# Patient Record
Sex: Male | Born: 1935
Health system: Southern US, Community
[De-identification: ages and names within clinical notes are randomized; demographics above are authoritative.]

## PROBLEM LIST (undated history)

## (undated) DIAGNOSIS — K219 Gastro-esophageal reflux disease without esophagitis: Secondary | ICD-10-CM

## (undated) DIAGNOSIS — R062 Wheezing: Secondary | ICD-10-CM

## (undated) DIAGNOSIS — F528 Other sexual dysfunction not due to a substance or known physiological condition: Secondary | ICD-10-CM

## (undated) DIAGNOSIS — R9431 Abnormal electrocardiogram [ECG] [EKG]: Secondary | ICD-10-CM

## (undated) DIAGNOSIS — R972 Elevated prostate specific antigen [PSA]: Secondary | ICD-10-CM

## (undated) DIAGNOSIS — J45909 Unspecified asthma, uncomplicated: Secondary | ICD-10-CM

## (undated) DIAGNOSIS — E039 Hypothyroidism, unspecified: Secondary | ICD-10-CM

## (undated) DIAGNOSIS — M76899 Other specified enthesopathies of unspecified lower limb, excluding foot: Secondary | ICD-10-CM

## (undated) DIAGNOSIS — M5430 Sciatica, unspecified side: Secondary | ICD-10-CM

## (undated) DIAGNOSIS — G3184 Mild cognitive impairment, so stated: Secondary | ICD-10-CM

## (undated) DIAGNOSIS — K449 Diaphragmatic hernia without obstruction or gangrene: Secondary | ICD-10-CM

## (undated) DIAGNOSIS — R296 Repeated falls: Secondary | ICD-10-CM

## (undated) DIAGNOSIS — I10 Essential (primary) hypertension: Secondary | ICD-10-CM

## (undated) DIAGNOSIS — F0150 Vascular dementia without behavioral disturbance: Secondary | ICD-10-CM

## (undated) DIAGNOSIS — F0390 Unspecified dementia without behavioral disturbance: Secondary | ICD-10-CM

## (undated) DIAGNOSIS — K222 Esophageal obstruction: Secondary | ICD-10-CM

## (undated) DIAGNOSIS — R350 Frequency of micturition: Secondary | ICD-10-CM

## (undated) DIAGNOSIS — F411 Generalized anxiety disorder: Secondary | ICD-10-CM

## (undated) DIAGNOSIS — N400 Enlarged prostate without lower urinary tract symptoms: Secondary | ICD-10-CM

## (undated) DIAGNOSIS — J40 Bronchitis, not specified as acute or chronic: Secondary | ICD-10-CM

## (undated) DIAGNOSIS — R21 Rash and other nonspecific skin eruption: Secondary | ICD-10-CM

## (undated) DIAGNOSIS — E785 Hyperlipidemia, unspecified: Secondary | ICD-10-CM

## (undated) DIAGNOSIS — J209 Acute bronchitis, unspecified: Secondary | ICD-10-CM

## (undated) DIAGNOSIS — N529 Male erectile dysfunction, unspecified: Secondary | ICD-10-CM

## (undated) DIAGNOSIS — R05 Cough: Secondary | ICD-10-CM

## (undated) DIAGNOSIS — Z87442 Personal history of urinary calculi: Secondary | ICD-10-CM

## (undated) DIAGNOSIS — J309 Allergic rhinitis, unspecified: Secondary | ICD-10-CM

## (undated) DIAGNOSIS — N189 Chronic kidney disease, unspecified: Secondary | ICD-10-CM

## (undated) DIAGNOSIS — J449 Chronic obstructive pulmonary disease, unspecified: Secondary | ICD-10-CM

## (undated) HISTORY — DX: Chronic obstructive pulmonary disease, unspecified: J44.9

## (undated) HISTORY — DX: Cough: R05

## (undated) HISTORY — DX: Mild cognitive impairment, so stated: G31.84

## (undated) HISTORY — DX: Hyperlipidemia, unspecified: E78.5

## (undated) HISTORY — DX: Other sexual dysfunction not due to a substance or known physiological condition: F52.8

## (undated) HISTORY — DX: Gastro-esophageal reflux disease without esophagitis: K21.9

## (undated) HISTORY — DX: Hypothyroidism, unspecified: E03.9

## (undated) HISTORY — DX: Rash and other nonspecific skin eruption: R21

## (undated) HISTORY — DX: Sciatica, unspecified side: M54.30

## (undated) HISTORY — DX: Diaphragmatic hernia without obstruction or gangrene: K44.9

## (undated) HISTORY — DX: Benign prostatic hyperplasia without lower urinary tract symptoms: N40.0

## (undated) HISTORY — DX: Male erectile dysfunction, unspecified: N52.9

## (undated) HISTORY — DX: Generalized anxiety disorder: F41.1

## (undated) HISTORY — DX: Abnormal electrocardiogram (ECG) (EKG): R94.31

## (undated) HISTORY — DX: Other specified enthesopathies of unspecified lower limb, excluding foot: M76.899

## (undated) HISTORY — DX: Unspecified asthma, uncomplicated: J45.909

## (undated) HISTORY — DX: Allergic rhinitis, unspecified: J30.9

## (undated) HISTORY — DX: Bronchitis, not specified as acute or chronic: J40

## (undated) HISTORY — DX: Unspecified dementia without behavioral disturbance: F03.90

## (undated) HISTORY — DX: Frequency of micturition: R35.0

## (undated) HISTORY — PX: TONSILLECTOMY: SUR1361

## (undated) HISTORY — DX: Essential (primary) hypertension: I10

## (undated) HISTORY — DX: Esophageal obstruction: K22.2

## (undated) HISTORY — DX: Wheezing: R06.2

## (undated) HISTORY — DX: Elevated prostate specific antigen (PSA): R97.20

## (undated) HISTORY — DX: Personal history of urinary calculi: Z87.442

## (undated) HISTORY — DX: Acute bronchitis, unspecified: J20.9

---

## 1998-11-21 ENCOUNTER — Emergency Department (HOSPITAL_COMMUNITY): Admission: EM | Admit: 1998-11-21 | Discharge: 1998-11-21 | Payer: Self-pay | Admitting: Emergency Medicine

## 1999-10-14 ENCOUNTER — Encounter: Payer: Self-pay | Admitting: Family Medicine

## 1999-10-14 ENCOUNTER — Encounter: Admission: RE | Admit: 1999-10-14 | Discharge: 1999-10-14 | Payer: Self-pay | Admitting: Family Medicine

## 2004-06-27 ENCOUNTER — Emergency Department (HOSPITAL_COMMUNITY): Admission: EM | Admit: 2004-06-27 | Discharge: 2004-06-27 | Payer: Self-pay | Admitting: Emergency Medicine

## 2004-09-10 ENCOUNTER — Encounter: Admission: RE | Admit: 2004-09-10 | Discharge: 2004-09-10 | Payer: Self-pay | Admitting: Family Medicine

## 2004-11-08 ENCOUNTER — Ambulatory Visit: Payer: Self-pay | Admitting: Internal Medicine

## 2004-11-12 ENCOUNTER — Ambulatory Visit: Payer: Self-pay | Admitting: Internal Medicine

## 2004-12-01 ENCOUNTER — Ambulatory Visit: Payer: Self-pay | Admitting: Internal Medicine

## 2004-12-09 ENCOUNTER — Ambulatory Visit: Payer: Self-pay | Admitting: Internal Medicine

## 2005-03-14 ENCOUNTER — Ambulatory Visit: Payer: Self-pay | Admitting: Internal Medicine

## 2005-04-18 ENCOUNTER — Ambulatory Visit: Payer: Self-pay | Admitting: Internal Medicine

## 2005-04-28 ENCOUNTER — Ambulatory Visit: Payer: Self-pay

## 2005-08-20 ENCOUNTER — Ambulatory Visit: Payer: Self-pay | Admitting: Internal Medicine

## 2006-03-16 ENCOUNTER — Ambulatory Visit (HOSPITAL_BASED_OUTPATIENT_CLINIC_OR_DEPARTMENT_OTHER): Admission: RE | Admit: 2006-03-16 | Discharge: 2006-03-16 | Payer: Self-pay | Admitting: Urology

## 2006-06-06 ENCOUNTER — Ambulatory Visit: Payer: Self-pay | Admitting: Internal Medicine

## 2006-06-06 LAB — CONVERTED CEMR LAB
ALT: 18 units/L (ref 0–40)
AST: 27 units/L (ref 0–37)
Alkaline Phosphatase: 59 units/L (ref 39–117)
BUN: 26 mg/dL — ABNORMAL HIGH (ref 6–23)
Basophils Relative: 0.5 % (ref 0.0–1.0)
CO2: 30 meq/L (ref 19–32)
Calcium: 9.2 mg/dL (ref 8.4–10.5)
Chloride: 103 meq/L (ref 96–112)
Cholesterol: 146 mg/dL (ref 0–200)
GFR calc Af Amer: 63 mL/min
HDL: 42.9 mg/dL (ref >39.0)
Lymphocytes Relative: 24.2 % (ref 12.0–46.0)
Monocytes Relative: 9.3 % (ref 3.0–11.0)
Neutro Abs: 6.2 10*3/uL (ref 1.4–7.7)
Platelets: 213 10*3/uL (ref 150–400)
RBC: 4.29 M/uL (ref 4.22–5.81)
RDW: 11.9 % (ref 11.5–14.6)
TSH: 4.77 microintl units/mL (ref 0.35–5.50)
Total CHOL/HDL Ratio: 3.4
Total Protein: 6.3 g/dL (ref 6.0–8.3)
Triglycerides: 222 mg/dL (ref 0–149)
VLDL: 44 mg/dL — ABNORMAL HIGH (ref 0–40)
WBC: 9.6 10*3/uL (ref 4.5–10.5)

## 2006-07-11 ENCOUNTER — Ambulatory Visit: Payer: Self-pay | Admitting: Internal Medicine

## 2006-10-27 ENCOUNTER — Ambulatory Visit: Payer: Self-pay | Admitting: Internal Medicine

## 2006-11-21 ENCOUNTER — Ambulatory Visit: Payer: Self-pay | Admitting: Internal Medicine

## 2007-03-23 ENCOUNTER — Ambulatory Visit: Payer: Self-pay | Admitting: Internal Medicine

## 2007-03-23 DIAGNOSIS — I10 Essential (primary) hypertension: Secondary | ICD-10-CM

## 2007-03-23 DIAGNOSIS — Z87442 Personal history of urinary calculi: Secondary | ICD-10-CM | POA: Insufficient documentation

## 2007-03-23 DIAGNOSIS — F528 Other sexual dysfunction not due to a substance or known physiological condition: Secondary | ICD-10-CM

## 2007-03-23 DIAGNOSIS — K219 Gastro-esophageal reflux disease without esophagitis: Secondary | ICD-10-CM | POA: Insufficient documentation

## 2007-03-23 DIAGNOSIS — E785 Hyperlipidemia, unspecified: Secondary | ICD-10-CM | POA: Insufficient documentation

## 2007-03-23 DIAGNOSIS — N400 Enlarged prostate without lower urinary tract symptoms: Secondary | ICD-10-CM

## 2007-03-23 DIAGNOSIS — N401 Enlarged prostate with lower urinary tract symptoms: Secondary | ICD-10-CM | POA: Insufficient documentation

## 2007-03-23 DIAGNOSIS — R21 Rash and other nonspecific skin eruption: Secondary | ICD-10-CM

## 2007-03-23 DIAGNOSIS — J309 Allergic rhinitis, unspecified: Secondary | ICD-10-CM

## 2007-03-23 HISTORY — DX: Hyperlipidemia, unspecified: E78.5

## 2007-03-23 HISTORY — DX: Benign prostatic hyperplasia without lower urinary tract symptoms: N40.0

## 2007-03-23 HISTORY — DX: Other sexual dysfunction not due to a substance or known physiological condition: F52.8

## 2007-03-23 HISTORY — DX: Allergic rhinitis, unspecified: J30.9

## 2007-03-23 HISTORY — DX: Gastro-esophageal reflux disease without esophagitis: K21.9

## 2007-03-23 HISTORY — DX: Rash and other nonspecific skin eruption: R21

## 2007-03-23 HISTORY — DX: Essential (primary) hypertension: I10

## 2007-03-23 HISTORY — DX: Personal history of urinary calculi: Z87.442

## 2007-04-21 ENCOUNTER — Ambulatory Visit: Payer: Self-pay | Admitting: Internal Medicine

## 2007-04-21 DIAGNOSIS — J40 Bronchitis, not specified as acute or chronic: Secondary | ICD-10-CM | POA: Insufficient documentation

## 2007-04-21 HISTORY — DX: Bronchitis, not specified as acute or chronic: J40

## 2007-04-27 ENCOUNTER — Ambulatory Visit: Payer: Self-pay | Admitting: Internal Medicine

## 2007-04-27 DIAGNOSIS — J209 Acute bronchitis, unspecified: Secondary | ICD-10-CM

## 2007-04-27 HISTORY — DX: Acute bronchitis, unspecified: J20.9

## 2007-06-14 ENCOUNTER — Ambulatory Visit: Payer: Self-pay | Admitting: Internal Medicine

## 2007-06-14 LAB — CONVERTED CEMR LAB
ALT: 16 units/L (ref 0–53)
Alkaline Phosphatase: 61 units/L (ref 39–117)
BUN: 24 mg/dL — ABNORMAL HIGH (ref 6–23)
Basophils Absolute: 0 10*3/uL (ref 0.0–0.1)
Bilirubin Urine: NEGATIVE
Bilirubin, Direct: 0.2 mg/dL (ref 0.0–0.3)
Calcium: 9.1 mg/dL (ref 8.4–10.5)
Cholesterol: 169 mg/dL (ref 0–200)
Eosinophils Absolute: 0.1 10*3/uL (ref 0.0–0.6)
GFR calc Af Amer: 76 mL/min
GFR calc non Af Amer: 62 mL/min
Glucose, Bld: 92 mg/dL (ref 70–99)
Lymphocytes Relative: 34.4 % (ref 12.0–46.0)
MCHC: 33.2 g/dL (ref 30.0–36.0)
MCV: 95.4 fL (ref 78.0–100.0)
Monocytes Relative: 8.9 % (ref 3.0–11.0)
Neutro Abs: 3.3 10*3/uL (ref 1.4–7.7)
Platelets: 222 10*3/uL (ref 150–400)
Total CHOL/HDL Ratio: 3.5
Triglycerides: 110 mg/dL (ref 0–149)
Urine Glucose: NEGATIVE mg/dL

## 2007-06-21 ENCOUNTER — Ambulatory Visit: Payer: Self-pay | Admitting: Internal Medicine

## 2007-06-21 DIAGNOSIS — R9431 Abnormal electrocardiogram [ECG] [EKG]: Secondary | ICD-10-CM

## 2007-06-21 HISTORY — DX: Abnormal electrocardiogram (ECG) (EKG): R94.31

## 2007-06-28 ENCOUNTER — Ambulatory Visit: Payer: Self-pay

## 2007-06-28 ENCOUNTER — Encounter: Payer: Self-pay | Admitting: Internal Medicine

## 2007-07-18 DIAGNOSIS — K222 Esophageal obstruction: Secondary | ICD-10-CM

## 2007-07-18 DIAGNOSIS — K449 Diaphragmatic hernia without obstruction or gangrene: Secondary | ICD-10-CM | POA: Insufficient documentation

## 2007-07-26 ENCOUNTER — Ambulatory Visit: Payer: Self-pay | Admitting: Pulmonary Disease

## 2008-03-14 ENCOUNTER — Telehealth (INDEPENDENT_AMBULATORY_CARE_PROVIDER_SITE_OTHER): Payer: Self-pay | Admitting: *Deleted

## 2008-04-02 ENCOUNTER — Ambulatory Visit: Payer: Self-pay | Admitting: Internal Medicine

## 2008-04-12 ENCOUNTER — Ambulatory Visit: Payer: Self-pay | Admitting: Family Medicine

## 2008-04-28 ENCOUNTER — Ambulatory Visit: Payer: Self-pay | Admitting: Internal Medicine

## 2008-04-28 DIAGNOSIS — M5430 Sciatica, unspecified side: Secondary | ICD-10-CM

## 2008-04-28 HISTORY — DX: Sciatica, unspecified side: M54.30

## 2008-06-17 ENCOUNTER — Ambulatory Visit: Payer: Self-pay | Admitting: Internal Medicine

## 2008-06-17 LAB — CONVERTED CEMR LAB
AST: 23 units/L (ref 0–37)
Albumin: 3.9 g/dL (ref 3.5–5.2)
Alkaline Phosphatase: 65 units/L (ref 39–117)
Basophils Absolute: 0 10*3/uL (ref 0.0–0.1)
Basophils Relative: 0.4 % (ref 0.0–3.0)
Bilirubin, Direct: 0.2 mg/dL (ref 0.0–0.3)
Calcium: 9.6 mg/dL (ref 8.4–10.5)
Direct LDL: 167.8 mg/dL
GFR calc non Af Amer: 68.76 mL/min (ref >60)
HDL: 44.3 mg/dL (ref >39.00)
Hemoglobin, Urine: NEGATIVE
Hemoglobin: 14.5 g/dL (ref 13.0–17.0)
Lymphocytes Relative: 24.5 % (ref 12.0–46.0)
Monocytes Relative: 8.1 % (ref 3.0–12.0)
Neutro Abs: 6.2 10*3/uL (ref 1.4–7.7)
RBC: 4.41 M/uL (ref 4.22–5.81)
RDW: 12.1 % (ref 11.5–14.6)
Sodium: 142 meq/L (ref 135–145)
Total CHOL/HDL Ratio: 6
Total Protein, Urine: NEGATIVE mg/dL
Triglycerides: 130 mg/dL (ref 0.0–149.0)
Urine Glucose: NEGATIVE mg/dL
VLDL: 26 mg/dL (ref 0.0–40.0)
pH: 6 (ref 5.0–8.0)

## 2008-06-27 ENCOUNTER — Ambulatory Visit: Payer: Self-pay | Admitting: Internal Medicine

## 2008-06-27 DIAGNOSIS — R972 Elevated prostate specific antigen [PSA]: Secondary | ICD-10-CM | POA: Insufficient documentation

## 2008-06-27 DIAGNOSIS — R5383 Other fatigue: Secondary | ICD-10-CM

## 2008-06-27 DIAGNOSIS — R5381 Other malaise: Secondary | ICD-10-CM

## 2008-06-27 HISTORY — DX: Elevated prostate specific antigen (PSA): R97.20

## 2008-07-31 ENCOUNTER — Ambulatory Visit: Payer: Self-pay | Admitting: Internal Medicine

## 2008-07-31 DIAGNOSIS — M76899 Other specified enthesopathies of unspecified lower limb, excluding foot: Secondary | ICD-10-CM

## 2008-07-31 HISTORY — DX: Other specified enthesopathies of unspecified lower limb, excluding foot: M76.899

## 2008-09-03 ENCOUNTER — Ambulatory Visit: Payer: Self-pay | Admitting: Internal Medicine

## 2008-09-04 LAB — CONVERTED CEMR LAB
ALT: 16 units/L (ref 0–53)
AST: 23 units/L (ref 0–37)
Alkaline Phosphatase: 60 units/L (ref 39–117)
Bilirubin, Direct: 0.2 mg/dL (ref 0.0–0.3)
HDL: 41.9 mg/dL (ref >39.00)
PSA: 3.3 ng/mL (ref 0.10–4.00)
Total Protein: 7.3 g/dL (ref 6.0–8.3)

## 2008-10-15 ENCOUNTER — Encounter: Payer: Self-pay | Admitting: Internal Medicine

## 2009-01-29 ENCOUNTER — Ambulatory Visit: Payer: Self-pay | Admitting: Internal Medicine

## 2009-01-29 DIAGNOSIS — R059 Cough, unspecified: Secondary | ICD-10-CM

## 2009-01-29 DIAGNOSIS — R05 Cough: Secondary | ICD-10-CM | POA: Insufficient documentation

## 2009-01-29 HISTORY — DX: Cough, unspecified: R05.9

## 2009-03-16 ENCOUNTER — Ambulatory Visit: Payer: Self-pay | Admitting: Internal Medicine

## 2009-03-26 ENCOUNTER — Telehealth: Payer: Self-pay | Admitting: Internal Medicine

## 2009-04-17 ENCOUNTER — Ambulatory Visit: Payer: Self-pay | Admitting: Internal Medicine

## 2009-04-17 DIAGNOSIS — R062 Wheezing: Secondary | ICD-10-CM

## 2009-04-17 DIAGNOSIS — N529 Male erectile dysfunction, unspecified: Secondary | ICD-10-CM

## 2009-04-17 HISTORY — DX: Wheezing: R06.2

## 2009-04-17 HISTORY — DX: Male erectile dysfunction, unspecified: N52.9

## 2009-04-20 ENCOUNTER — Ambulatory Visit: Payer: Self-pay | Admitting: Internal Medicine

## 2009-04-21 LAB — CONVERTED CEMR LAB
Alkaline Phosphatase: 60 units/L (ref 39–117)
Bilirubin, Direct: 0.3 mg/dL (ref 0.0–0.3)
LDL Cholesterol: 96 mg/dL (ref 0–99)
PSA: 4.03 ng/mL — ABNORMAL HIGH (ref 0.10–4.00)
Total Bilirubin: 1.5 mg/dL — ABNORMAL HIGH (ref 0.3–1.2)
Total CHOL/HDL Ratio: 3
VLDL: 22 mg/dL (ref 0.0–40.0)

## 2009-04-24 ENCOUNTER — Telehealth: Payer: Self-pay | Admitting: Internal Medicine

## 2009-04-28 ENCOUNTER — Ambulatory Visit: Payer: Self-pay | Admitting: Internal Medicine

## 2009-05-14 ENCOUNTER — Ambulatory Visit: Payer: Self-pay | Admitting: Pulmonary Disease

## 2009-07-02 ENCOUNTER — Telehealth (INDEPENDENT_AMBULATORY_CARE_PROVIDER_SITE_OTHER): Payer: Self-pay | Admitting: *Deleted

## 2009-07-22 ENCOUNTER — Encounter: Payer: Self-pay | Admitting: Internal Medicine

## 2009-07-22 ENCOUNTER — Ambulatory Visit: Payer: Self-pay | Admitting: Pulmonary Disease

## 2009-10-07 ENCOUNTER — Ambulatory Visit: Payer: Self-pay | Admitting: Internal Medicine

## 2009-11-25 ENCOUNTER — Telehealth: Payer: Self-pay | Admitting: Internal Medicine

## 2009-12-10 ENCOUNTER — Telehealth: Payer: Self-pay | Admitting: Internal Medicine

## 2009-12-11 ENCOUNTER — Encounter: Payer: Self-pay | Admitting: Internal Medicine

## 2010-01-11 ENCOUNTER — Telehealth: Payer: Self-pay | Admitting: Pulmonary Disease

## 2010-01-13 ENCOUNTER — Ambulatory Visit: Payer: Self-pay | Admitting: Pulmonary Disease

## 2010-01-14 DIAGNOSIS — J45909 Unspecified asthma, uncomplicated: Secondary | ICD-10-CM

## 2010-01-14 DIAGNOSIS — J45991 Cough variant asthma: Secondary | ICD-10-CM

## 2010-01-14 HISTORY — DX: Unspecified asthma, uncomplicated: J45.909

## 2010-03-18 ENCOUNTER — Ambulatory Visit: Payer: Self-pay | Admitting: Internal Medicine

## 2010-04-26 ENCOUNTER — Ambulatory Visit
Admission: RE | Admit: 2010-04-26 | Discharge: 2010-04-26 | Payer: Self-pay | Source: Home / Self Care | Attending: Internal Medicine | Admitting: Internal Medicine

## 2010-04-26 ENCOUNTER — Other Ambulatory Visit: Payer: Self-pay | Admitting: Internal Medicine

## 2010-04-26 DIAGNOSIS — R350 Frequency of micturition: Secondary | ICD-10-CM

## 2010-04-26 HISTORY — DX: Frequency of micturition: R35.0

## 2010-04-26 LAB — CBC WITH DIFFERENTIAL/PLATELET
Basophils Absolute: 0 10*3/uL (ref 0.0–0.1)
Eosinophils Absolute: 0.1 10*3/uL (ref 0.0–0.7)
Hemoglobin: 14.8 g/dL (ref 13.0–17.0)
Lymphocytes Relative: 24.6 % (ref 12.0–46.0)
Lymphs Abs: 2 10*3/uL (ref 0.7–4.0)
MCHC: 34.6 g/dL (ref 30.0–36.0)
Monocytes Relative: 8.1 % (ref 3.0–12.0)
Neutro Abs: 5.4 10*3/uL (ref 1.4–7.7)
Platelets: 242 10*3/uL (ref 150.0–400.0)
RDW: 12.7 % (ref 11.5–14.6)

## 2010-04-26 LAB — TSH: TSH: 5.22 u[IU]/mL (ref 0.35–5.50)

## 2010-04-26 LAB — BASIC METABOLIC PANEL WITH GFR
BUN: 25 mg/dL — ABNORMAL HIGH (ref 6–23)
CO2: 32 meq/L (ref 19–32)
Calcium: 9.5 mg/dL (ref 8.4–10.5)
Chloride: 100 meq/L (ref 96–112)
Creatinine, Ser: 1.2 mg/dL (ref 0.4–1.5)
GFR: 61.3 mL/min
Glucose, Bld: 80 mg/dL (ref 70–99)
Potassium: 4.9 meq/L (ref 3.5–5.1)
Sodium: 138 meq/L (ref 135–145)

## 2010-04-26 LAB — LIPID PANEL
Cholesterol: 177 mg/dL (ref 0–200)
HDL: 53.9 mg/dL
Total CHOL/HDL Ratio: 3
Triglycerides: 237 mg/dL — ABNORMAL HIGH (ref 0.0–149.0)
VLDL: 47.4 mg/dL — ABNORMAL HIGH (ref 0.0–40.0)

## 2010-04-26 LAB — HEPATIC FUNCTION PANEL
ALT: 16 U/L (ref 0–53)
AST: 20 U/L (ref 0–37)
Albumin: 4 g/dL (ref 3.5–5.2)
Alkaline Phosphatase: 66 U/L (ref 39–117)
Bilirubin, Direct: 0.2 mg/dL (ref 0.0–0.3)
Total Bilirubin: 1.5 mg/dL — ABNORMAL HIGH (ref 0.3–1.2)
Total Protein: 6.2 g/dL (ref 6.0–8.3)

## 2010-04-26 LAB — URINALYSIS, ROUTINE W REFLEX MICROSCOPIC
Bilirubin Urine: NEGATIVE
Hemoglobin, Urine: NEGATIVE
Ketones, ur: NEGATIVE
Total Protein, Urine: NEGATIVE
Urine Glucose: NEGATIVE
pH: 5.5 (ref 5.0–8.0)

## 2010-04-26 LAB — PSA: PSA: 4.59 ng/mL — ABNORMAL HIGH (ref 0.10–4.00)

## 2010-05-04 NOTE — Progress Notes (Signed)
Summary: REFILL  Phone Note Call from Patient   Caller: Patient Call For: ALVA Summary of Call: [address] Initial call taken by: Gustavus Bryant,  January 11, 2010 10:08 AM  Follow-up for Phone Call        Pt confirmed request for Qvar 80. Rx sent to pharmacy, pt advised, and follow up NP appointment scheduled. Iran Planas CMA  January 11, 2010 10:16 AM     Prescriptions: QVAR 80 MCG/ACT AERS (BECLOMETHASONE DIPROPIONATE) 2 puffs two times a day  #1 x 1   Entered by:   Iran Planas CMA   Authorized by:   Leanna Sato. Elsworth Soho MD   Signed by:   Iran Planas CMA on 01/11/2010   Method used:   Electronically to        [facility]. # [id number]* (retail)       [address]       [place], [place]  [address]       Ph: [phone] or [phone]       Fax: [phone]   RxID:   [id number]

## 2010-05-04 NOTE — Progress Notes (Signed)
Summary: PA-Benicar  Phone Note From Pharmacy   Summary of Call: PA-Benicar, called [employer]  @ [phone], they are refaxing forms . Ophelia Charter  December 10, 2009 2:59 PM  Faxed to [facility] @ [phone], awaiting approval . Ophelia Charter  December 11, 2009 9:05 AM  Insurance Denied Benicar non-formulary drug, must try and fail Avapro, Diovan or Losartan. Initial call taken by: Ophelia Charter,  December 11, 2009 2:54 PM  Follow-up for Phone Call        noted - call pt - insurance will not pay for benicar, so we will need to change to diovan (which is very close to the same thing) -   done per emr Follow-up by: Biagio Borg MD,  December 11, 2009 4:08 PM  Additional Follow-up for Phone Call Additional follow up Details #1::        Phone number out of service. Crissie Sickles, CMA  December 11, 2009 4:38 PM  Pt informed of medication change via VM. told to call back with any further questions or concerns Additional Follow-up by: Crissie Sickles, CMA,  December 14, 2009 9:26 AM    New/Updated Medications: DIOVAN 160 MG TABS (VALSARTAN) 1po once daily Prescriptions: DIOVAN 160 MG TABS (VALSARTAN) 1po once daily  #30 x 11   Entered and Authorized by:   Biagio Borg MD   Signed by:   Biagio Borg MD on 12/11/2009   Method used:   Electronically to        [facility]. # [id number]* (retail)       [address]       [place], [place]  [address]       Ph: [phone] or [phone]       Fax: [phone]   RxID:   [id number]

## 2010-05-04 NOTE — Medication Information (Signed)
Summary: [facility] [facility]  [facility] [facility]   Imported By: Bubba Hales 12/18/2009 12:46:27  _____________________________________________________________________  External Attachment:    Type:   Image     Comment:   External Document

## 2010-05-04 NOTE — Assessment & Plan Note (Signed)
Summary: 6 MO ROV /RASH/ NWS   Vital Signs:  Patient profile:   77 year old male Height:      72 inches Weight:      204.75 pounds BMI:     27.87 O2 Sat:      94 % on Room air Temp:     98.3 degrees F oral Pulse rate:   70 / minute BP sitting:   132 / 74  (left arm) Cuff size:   large  Vitals Entered By: Shirlean Mylar Ewing CMA (AAMA) (October 07, 2009 4:05 PM)  O2 Flow:  Room air  CC: 6 month ROV/RE   Primary Care Provider:  Jenny Reichmann  CC:  6 month ROV/RE.  History of Present Illness: overall doing ok, except for itchy rash recurring to the forearms, has seens derm but told (per pt) he was "just getting old."  Quite pruritic however adn lately drawing blood with excoriations.  No fever, sweling but red and itching area seems larger with more scratching.  Pt denies CP, sob, doe, wheezing, orthopnea, pnd, worsening LE edema, palps, dizziness or syncope  Pt denies new neuro symptoms such as headache, facial or extremity weakness .  Overall good compliance with meds, trying to follow low chol diet, wt stable, little excercise however   Here for wellness Diet: Heart Healthy or DM if diabetic Physical Activities: Sedentary Depression/mood screen: Negative Hearing: Intact bilateral Visual Acuity: Grossly normal, gets exam yearly ADL's: Capable  Fall Risk: None Home Safety: Good Cognitive Impairment:  Gen appearance, affect, speech, memory, attention & motor skills grossly intact End-of-Life Planning: Advance directive - Full code/I agree   Preventive Screening-Counseling & Management      Drug Use:  no.    Problems Prior to Update: 1)  Wheezing  (ICD-786.07) 2)  Erectile Dysfunction, Organic  (ICD-607.84) 3)  Rash-nonvesicular  (ICD-782.1) 4)  Cough  (ICD-786.2) 5)  Hepatotoxicity, Drug-induced, Risk of  (ICD-V58.69) 6)  Bursitis, Right Hip  (ICD-726.5) 7)  Fatigue  (ICD-780.79) 8)  Psa, Increased  (ICD-790.93) 9)  Sciatica, Right  (ICD-724.3) 10)  Schatzki's Ring  (ICD-530.3) 11)   Hiatal Hernia  (ICD-553.3) 12)  Abnormal Electrocardiogram  (ICD-794.31) 13)  Preventive Health Care  (ICD-V70.0) 14)  Asthmatic Bronchitis, Acute  (ICD-466.0) 15)  Bronchitis Not Specified As Acute or Chronic  (ICD-490) 16)  Rash-nonvesicular  (ICD-782.1) 17)  Nephrolithiasis, Hx of  (ICD-V13.01) 18)  Erectile Dysfunction  (ICD-302.72) 19)  Benign Prostatic Hypertrophy  (ICD-600.00) 20)  Allergic Rhinitis  (ICD-477.9) 21)  Gerd  (ICD-530.81) 22)  Hyperlipidemia  (ICD-272.4) 23)  Hypertension  (ICD-401.9)  Medications Prior to Update: 1)  Centrum Silver Ultra Mens  Tabs (Multiple Vitamins-Minerals) .Marland Kitchen.. 1 By Mouth Once Daily 2)  Tamsulosin Hcl 0.4 Mg Caps (Tamsulosin Hcl) .... Take 1 Capsule By Mouth Once A Day 3)  Prevacid Solutab 30 Mg Tbdp (Lansoprazole) .... Take 1 Tablet 30 Minutes Before Breakfast Daily As Needed 4)  Dulcolax 5 Mg  Tbec (Bisacodyl) .... As Needed 5)  Dilt-Xr 180 Mg Xr24h-Cap (Diltiazem Hcl) .Marland Kitchen.. 1 By Mouth Once Daily 6)  Proair Hfa 108 (90 Base) Mcg/act Aers (Albuterol Sulfate) .... 2 Puffs Q 6 Hrs As Needed 7)  Vitamin D 1000 Unit Caps (Cholecalciferol) .Marland Kitchen.. 1 By Mouth Once Daily 8)  Crestor 20 Mg Tabs (Rosuvastatin Calcium) .... As Directed 9)  Fish Oil 1200 Mg Caps (Omega-3 Fatty Acids) .Marland Kitchen.. 1 By Mouth Once Daily 10)  Qvar 80 Mcg/act Aers (Beclomethasone Dipropionate) .... 2 Puffs  Two Times A Day  Current Medications (verified): 1)  Centrum Silver Ultra Mens  Tabs (Multiple Vitamins-Minerals) .Marland Kitchen.. 1 By Mouth Once Daily 2)  Tamsulosin Hcl 0.4 Mg Caps (Tamsulosin Hcl) .... Take 1 Capsule By Mouth Once A Day 3)  Prevacid Solutab 30 Mg Tbdp (Lansoprazole) .... Take 1 Tablet 30 Minutes Before Breakfast Daily As Needed 4)  Dulcolax 5 Mg  Tbec (Bisacodyl) .... As Needed 5)  Dilt-Xr 180 Mg Xr24h-Cap (Diltiazem Hcl) .Marland Kitchen.. 1 By Mouth Once Daily 6)  Proair Hfa 108 (90 Base) Mcg/act Aers (Albuterol Sulfate) .... 2 Puffs Q 6 Hrs As Needed 7)  Vitamin D 1000 Unit Caps  (Cholecalciferol) .Marland Kitchen.. 1 By Mouth Once Daily 8)  Crestor 20 Mg Tabs (Rosuvastatin Calcium) .... As Directed 9)  Fish Oil 1200 Mg Caps (Omega-3 Fatty Acids) .Marland Kitchen.. 1 By Mouth Once Daily 10)  Qvar 80 Mcg/act Aers (Beclomethasone Dipropionate) .... 2 Puffs Two Times A Day 11)  Prednisone 10 Mg Tabs (Prednisone) .... 3po Qd For 3days, Then 2po Qd For 3days, Then 1po Qd For 3days, Then Stop 12)  Triamcinolone Acetonide 0.1 % Crea (Triamcinolone Acetonide) .... Use Asd Two Times A Day As Needed 13)  Fluticasone Propionate 50 Mcg/act Susp (Fluticasone Propionate) .... 2 Spray/side Once Daily  Allergies (verified): 1)  Cardura 2)  Lipitor (Atorvastatin)  Past History:  Family History: Last updated: 07/26/2007 Family History Lung cancer Family History Hypertension COPD cancer brother smoking emphysema-brother smoking  Social History: Last updated: 10/07/2009 Former Smoker Alcohol use-no Married 2 children retired  Ambulance person lab for 20 yrs, later Herbalist for 17 yrs Drug use-no  Risk Factors: Smoking Status: quit (01/29/2009)  Past Medical History: Hypertension Hyperlipidemia chronic constipation  -  Dr Dennie Fetters GERD schatzki's ring Allergic rhinitis Benign prostatic hypertrophy eczema E.D. Nephrolithiasis, hx of Wheezing/reactive airways disease -   Dr Mia Creek  Past Surgical History: Reviewed history from 03/23/2007 and no changes required. Tonsillectomy  Social History: Reviewed history from 04/17/2009 and no changes required. Former Smoker Alcohol use-no Married 2 children retired  Ambulance person lab for 20 yrs, later Herbalist for 17 yrs Drug use-no Drug Use:  no  Review of Systems  The patient denies anorexia, fever, vision loss, decreased hearing, hoarseness, chest pain, syncope, dyspnea on exertion, peripheral edema, prolonged cough, headaches, hemoptysis, abdominal pain, melena, hematochezia, severe indigestion/heartburn, hematuria, muscle weakness,  suspicious skin lesions, transient blindness, difficulty walking, depression, unusual weight change, abnormal bleeding, enlarged lymph nodes, and angioedema.         all otherwise negative per pt -    Physical Exam  General:  alert and overweight-appearing.   Head:  normocephalic and atraumatic.   Eyes:  vision grossly intact, pupils equal, and pupils round.   Ears:  R ear normal and L ear normal.   Nose:  no external deformity and no nasal discharge.   Mouth:  no gingival abnormalities and pharynx pink and moist.   Neck:  supple and no masses.   Lungs:  normal respiratory effort and normal breath sounds.   Heart:  normal rate and regular rhythm.   Abdomen:  soft, non-tender, and normal bowel sounds.   Msk:  no joint tenderness and no joint swelling.   Extremities:  no edema, no erythema  Skin:  marked eczema flare bilat arms wtih excoriations Psych:  not depressed appearing and moderately anxious.     Impression & Recommendations:  Problem # 1:  Preventive Health Care (ICD-V70.0)  Overall doing well, age  appropriate education and counseling updated and referral for appropriate preventive services done unless declined, immunizations up to date or declined, diet counseling done if overweight, urged to quit smoking if smokes , most recent labs reviewed and current ordered if appropriate, ecg reviewed or declined (interpretation per ECG scanned in the EMR if done); information regarding Medicare Prevention requirements given if appropriate; speciality referrals updated as appropriate   Orders: First annual wellness visit with prevention plan  [id number])  Problem # 2:  RASH-NONVESICULAR (ICD-782.1)  c/w prob allergic type/eczema severe flare  - for depo today, pred pack and triam cr  His updated medication list for this problem includes:    Triamcinolone Acetonide 0.1 % Crea (Triamcinolone acetonide) ..... Use asd two times a day as needed  Orders: Depo- Medrol 40mg  (J1030) Depo-  Medrol 80mg  (J1040) Admin of Therapeutic Inj  intramuscular or subcutaneous [id number]) Prescription Created Electronically [id number])  Problem # 3:  HYPERTENSION (ICD-401.9)  His updated medication list for this problem includes:    Dilt-xr 180 Mg Xr24h-cap (Diltiazem hcl) .Marland Kitchen... 1 by mouth once daily  BP today: 132/74 Prior BP: 130/80 (07/22/2009)  Labs Reviewed: K+: 4.7 (06/17/2008) Creat: : 1.1 (06/17/2008)   Chol: 167 (04/20/2009)   HDL: 49.40 (04/20/2009)   LDL: 96 (04/20/2009)   TG: 110.0 (04/20/2009) stable overall by hx and exam, ok to continue meds/tx as is   Problem # 4:  HYPERLIPIDEMIA (ICD-272.4)  His updated medication list for this problem includes:    Crestor 20 Mg Tabs (Rosuvastatin calcium) .Marland Kitchen... As directed  Labs Reviewed: SGOT: 28 (04/20/2009)   SGPT: 21 (04/20/2009)   HDL:49.40 (04/20/2009), 41.90 (09/03/2008)  LDL:96 (04/20/2009), 99 (06/14/2007)  Chol:167 (04/20/2009), 234 (09/03/2008)  Trig:110.0 (04/20/2009), 223.0 (09/03/2008) stable overall by hx and exam, ok to continue meds/tx as is   Problem # 5:  ALLERGIC RHINITIS (ICD-477.9)  His updated medication list for this problem includes:    Fluticasone Propionate 50 Mcg/act Susp (Fluticasone propionate) .Marland Kitchen... 2 spray/side once daily treat as above, f/u any worsening signs or symptoms , stable overall by hx and exam, ok to continue meds/tx as is   Complete Medication List: 1)  Centrum Silver Ultra Mens Tabs (Multiple vitamins-minerals) .Marland Kitchen.. 1 by mouth once daily 2)  Tamsulosin Hcl 0.4 Mg Caps (Tamsulosin hcl) .... Take 1 capsule by mouth once a day 3)  Prevacid Solutab 30 Mg Tbdp (Lansoprazole) .... Take 1 tablet 30 minutes before breakfast daily as needed 4)  Dulcolax 5 Mg Tbec (Bisacodyl) .... As needed 5)  Dilt-xr 180 Mg Xr24h-cap (Diltiazem hcl) .Marland Kitchen.. 1 by mouth once daily 6)  Proair Hfa 108 (90 Base) Mcg/act Aers (Albuterol sulfate) .... 2 puffs q 6 hrs as needed 7)  Vitamin D 1000 Unit Caps  (Cholecalciferol) .Marland Kitchen.. 1 by mouth once daily 8)  Crestor 20 Mg Tabs (Rosuvastatin calcium) .... As directed 9)  Fish Oil 1200 Mg Caps (Omega-3 fatty acids) .Marland Kitchen.. 1 by mouth once daily 10)  Qvar 80 Mcg/act Aers (Beclomethasone dipropionate) .... 2 puffs two times a day 11)  Prednisone 10 Mg Tabs (Prednisone) .... 3po qd for 3days, then 2po qd for 3days, then 1po qd for 3days, then stop 12)  Triamcinolone Acetonide 0.1 % Crea (Triamcinolone acetonide) .... Use asd two times a day as needed 13)  Fluticasone Propionate 50 Mcg/act Susp (Fluticasone propionate) .... 2 spray/side once daily  Patient Instructions: 1)  you had the steroid shot 2)  Please take all new medications as prescribed  - the prednisone  and steroid cream as needed  3)  Continue all previous medications as before this visit  4)  Please schedule a follow-up appointment in 6 months for your "yearly medicare exam" Prescriptions: DILT-XR 180 MG XR24H-CAP (DILTIAZEM HCL) 1 by mouth once daily  #90 x 3   Entered and Authorized by:   Biagio Borg MD   Signed by:   Biagio Borg MD on 10/07/2009   Method used:   Print then Give to Patient   RxID:   [id number] FLUTICASONE PROPIONATE 50 MCG/ACT SUSP (FLUTICASONE PROPIONATE) 2 spray/side once daily  #1 x 11   Entered and Authorized by:   Biagio Borg MD   Signed by:   Biagio Borg MD on 10/07/2009   Method used:   Print then Give to Patient   RxID:   [id number] TRIAMCINOLONE ACETONIDE 0.1 % CREA (TRIAMCINOLONE ACETONIDE) use asd two times a day as needed  #1 x 1   Entered and Authorized by:   Biagio Borg MD   Signed by:   Biagio Borg MD on 10/07/2009   Method used:   Print then Give to Patient   RxID:   [id number] PREDNISONE 10 MG TABS (PREDNISONE) 3po qd for 3days, then 2po qd for 3days, then 1po qd for 3days, then stop  #18 x 0   Entered and Authorized by:   Biagio Borg MD   Signed by:   Biagio Borg MD on 10/07/2009   Method used:   Print then Give to  Patient   RxID:   [id number]    Medication Administration  Injection # 1:    Medication: Depo- Medrol 40mg     Diagnosis: RASH-NONVESICULAR (W8805310.1)    Route: IM    Site: RUOQ gluteus    Exp Date: 07/2012    Lot #: [id number]    Mfr: [employer]acia    Given by: Shirlean Mylar Ewing CMA ([place]) (October 07, 2009 4:59 PM)  Injection # 2:    Medication: Depo- Medrol 80mg     Diagnosis: RASH-NONVESICULAR (ICD-782.1)    Route: IM    Site: RUOQ gluteus    Exp Date: 07/2012    Lot #: [id number]    Mfr: [employer]    Given by: Shirlean Mylar Ewing CMA ([place]) (October 07, 2009 4:59 PM)  Orders Added: 1)  Depo- Medrol 40mg  [J1030] 2)  Depo- Medrol 80mg  [J1040] 3)  Admin of Therapeutic Inj  intramuscular or subcutaneous [id number] 4)  First annual wellness visit with prevention plan  [id number])  Prescription Created Electronically [id number])  Est. Patient Level IV [id number]

## 2010-05-04 NOTE — Miscellaneous (Signed)
Summary: Orders Update pft charges  Clinical Lists Changes  Orders: Added new Service order of Carbon Monoxide diffusing w/capacity (94720) - Signed Added new Service order of Lung Volumes (94240) - Signed Added new Service order of Spirometry (Pre & Post) (94060) - Signed 

## 2010-05-04 NOTE — Assessment & Plan Note (Signed)
Summary: 2 weeks w/pft/apc   Visit Type:  Follow-up Primary Provider/Referring Provider:  Jenny Reichmann  CC:  Pt here for follow up with PFT. Pt states some improvement with breathing. Pt c/o wheezing and slight chest congestion.  History of Present Illness: 77 yo with hx of wheezing seen initially 4/09 for evaluation.   4/09-- Episode of bronchitis in 1/09 with dyspnea/ wheezing preceded by URI, required steroid shot. Had persistent wheezing afterwards requiring albuterol MDI . Occ nocturnal wheezing described by wife, this has improved after he changed bedrooms. No childhood h/o asthma, smoked a little bit as a teenager, no pets at home or other exposures, Worked as a Geneticist, molecular , then [employer].  May 14, 2009 4:15 PM  Did well since last seen until last 4 months. Wheezing returned requiring acute office visit, Worse at night, cold air, prolonged talking. He had stopped advair. Psuedowheezing noted? LPR vs rhinitis Improved with prednisone taper, zyrtec & prevacid. CRX nml 01/29/09. Dyspnea seems not to be a problem. PFTs mild reversible airway obstruction esp smaller airways  Denies chest pain,  orthopnea, hemoptysis, fever, n/v/d, edema, headache.   Current Medications (verified): 1)  Centrum Silver Ultra Mens  Tabs (Multiple Vitamins-Minerals) .Marland Kitchen.. 1 By Mouth Once Daily 2)  Tamsulosin Hcl 0.4 Mg Caps (Tamsulosin Hcl) .... Take 1 Capsule By Mouth Once A Day 3)  Prevacid Solutab 30 Mg Tbdp (Lansoprazole) .... Take 1 Tablet 30 Minutes Before Breakfast Daily As Needed 4)  Dulcolax 5 Mg  Tbec (Bisacodyl) .... As Needed 5)  Dilt-Xr 180 Mg Xr24h-Cap (Diltiazem Hcl) .Marland Kitchen.. 1 By Mouth Once Daily 6)  Proair Hfa 108 (90 Base) Mcg/act Aers (Albuterol Sulfate) .... 2 Puffs Q 6 Hrs As Needed 7)  Vitamin D 1000 Unit Caps (Cholecalciferol) .Marland Kitchen.. 1 By Mouth Once Daily 8)  Crestor 20 Mg Tabs (Rosuvastatin Calcium) .... As Directed 9)  Fish Oil 1200 Mg Caps (Omega-3 Fatty Acids) .Marland Kitchen.. 1 By Mouth  Once Daily  Allergies (verified): 1)  Cardura 2)  Lipitor (Atorvastatin)  Past History:  Past Medical History: Last updated: 03/23/2007 Hypertension Hyperlipidemia chronic constipation GERD schatzki's ring Allergic rhinitis Benign prostatic hypertrophy eczema E.D. Nephrolithiasis, hx of  Social History: Last updated: 04/17/2009 Former Smoker Alcohol use-no Married 2 children retired  Ambulance person lab for 20 yrs, later Herbalist for 17 yrs  Review of Systems  The patient denies anorexia, fever, weight loss, weight gain, vision loss, decreased hearing, hoarseness, chest pain, syncope, dyspnea on exertion, peripheral edema, prolonged cough, headaches, hemoptysis, abdominal pain, melena, severe indigestion/heartburn, hematuria, muscle weakness, difficulty walking, depression, unusual weight change, and abnormal bleeding.    Vital Signs:  Patient profile:   77 year old male Height:      72 inches Weight:      205 pounds O2 Sat:      94 % on Room air Temp:     98.0 degrees F oral Pulse rate:   74 / minute BP sitting:   118 / 80  (left arm) Cuff size:   regular  Vitals Entered By: Iran Planas CMA (May 14, 2009 4:02 PM)  O2 Flow:  Room air CC: Pt here for follow up with PFT. Pt states some improvement with breathing. Pt c/o wheezing and slight chest congestion Comments Medications reviewed with patient Verified pt's contact number Iran Planas CMA  May 14, 2009 4:09 PM    Physical Exam  Additional Exam:  GEN: A/Ox3; pleasant , NAD HEENT:  [place]/AT, , EACs-clear, TMs-wnl, NOSE-clear, THROAT-clear  NECK:  Supple w/ fair ROM; no JVD; normal carotid impulses w/o bruits; no thyromegaly or nodules palpated; no lymphadenopathy. RESP  Coarse BS w/ upper airway psuedowheezing.  CARD:  RRR, no m/r/g   GI:   Soft & nt; nml bowel sounds; no organomegaly or masses detected. [place]: Warm bil,  no calf tenderness edema, clubbing, pulses intact     Impression &  Recommendations:  Problem # 1:  WHEEZING (ICD-786.07)  Reactive airway disease - unclear cause - GERD vs rhino-sinusitis related. Will target both with prevacid & zyrtec. Qvar two times a day , use pro-air for rescue. Would be interesting to repeat spirometry on next visit.  Orders: Est. Patient Level III [id number]  Medications Added to Medication List This Visit: 1)  Crestor 20 Mg Tabs (Rosuvastatin calcium) .... As directed  Patient Instructions: 1)  Copy sent to: Dr Jenny Reichmann 2)  Please schedule a follow-up appointment in 2 months. 3)  STay on prevacid before meals once daily  4)  Stay on zyrtec at bedtime 5)  START Qvar 80 2 puffs two times a day  6)  Use Pro-air as rescue inhaler 2 puffs three times a day as needed

## 2010-05-04 NOTE — Assessment & Plan Note (Signed)
Summary: Acute NP office visit - wheezing   Primary Provider/Referring Provider:  Jenny Reichmann  CC:  wheezing esp when lying down, chest congestion, and dry cough x1week - denies increased SOB.  History of Present Illness: 77 yo with hx of cough/wheezing seen initially 4/09 for evaluation felt post bronchitic in nature.   4/09-- Episode of bronchitis in 1/09 with dyspnea/ wheezing preceded by URI, required steroid shot. Had persistent wheezing afterwards requiring albuterol MDI . Cough & dyspnea have resolved  - he can walk 30 min son thetreadmill now but gets tired with lifting anything heavy. Occ nocturnal wheezing described by wife, this has improved after he changed bedrooms. No childhood h/o asthma, smoked a little bit as a teenager, no pets at home or other exposures, Worked as a Geneticist, molecular , then [employer].  April 28, 2009 --Presents for an acute office visit. Complains of wheezing esp when lying down, chest congestion, dry cough for 2  week. Worse at night, cold air, prolonged talking. Can hear himself wheeze. He is not taking Advair. Did well since last seen until last 4 months, Has had on/offf symptoms worse last 2 weeks. Denies chest pain,  orthopnea, hemoptysis, fever, n/v/d, edema, headache.   Medications Prior to Update: 1)  Centrum Silver Ultra Mens  Tabs (Multiple Vitamins-Minerals) .Marland Kitchen.. 1 By Mouth Once Daily 2)  Flomax 0.4 Mg Cp24 (Tamsulosin Hcl) .... Take 1 Capsule By Mouth Daily 3)  Prevacid Solutab 30 Mg Tbdp (Lansoprazole) .... Take 1 Tablet 30 Minutes Before Breakfast Daily. 4)  Dulcolax 5 Mg  Tbec (Bisacodyl) .... As Needed 5)  Dilt-Xr 180 Mg Xr24h-Cap (Diltiazem Hcl) .Marland Kitchen.. 1 By Mouth Once Daily 6)  Fluticasone Propionate 50 Mcg/act Susp (Fluticasone Propionate) .... Spray 2 Spray Into Both  Nostrils Once A Day 7)  Proair Hfa 108 (90 Base) Mcg/act Aers (Albuterol Sulfate) .... 2 Puffs Q 6 Hrs As Needed 8)  Advair Diskus 250-50 Mcg/dose Misc (Fluticasone-Salmeterol)  .Marland Kitchen.. 1 Inh Two Times A Day 9)  Vitamin D 1000 Unit Caps (Cholecalciferol) .Marland Kitchen.. 1 By Mouth Once Daily 10)  Crestor 20 Mg Tabs (Rosuvastatin Calcium) .Marland Kitchen.. 1 By Mouth Once Daily 11)  Fish Oil 1200 Mg Caps (Omega-3 Fatty Acids) .Marland Kitchen.. 1 By Mouth Once Daily  Current Medications (verified): 1)  Centrum Silver Ultra Mens  Tabs (Multiple Vitamins-Minerals) .Marland Kitchen.. 1 By Mouth Once Daily 2)  Tamsulosin Hcl 0.4 Mg Caps (Tamsulosin Hcl) .... Take 1 Capsule By Mouth Once A Day 3)  Prevacid Solutab 30 Mg Tbdp (Lansoprazole) .... Take 1 Tablet 30 Minutes Before Breakfast Daily As Needed 4)  Dulcolax 5 Mg  Tbec (Bisacodyl) .... As Needed 5)  Dilt-Xr 180 Mg Xr24h-Cap (Diltiazem Hcl) .Marland Kitchen.. 1 By Mouth Once Daily 6)  Proair Hfa 108 (90 Base) Mcg/act Aers (Albuterol Sulfate) .... 2 Puffs Q 6 Hrs As Needed 7)  Advair Diskus 250-50 Mcg/dose Misc (Fluticasone-Salmeterol) .Marland Kitchen.. 1 Inh Two Times A Day 8)  Vitamin D 1000 Unit Caps (Cholecalciferol) .Marland Kitchen.. 1 By Mouth Once Daily 9)  Crestor 20 Mg Tabs (Rosuvastatin Calcium) .Marland Kitchen.. 1 By Mouth Once Daily 10)  Fish Oil 1200 Mg Caps (Omega-3 Fatty Acids) .Marland Kitchen.. 1 By Mouth Once Daily  Allergies (verified): 1)  Cardura 2)  Lipitor (Atorvastatin)  Past History:  Past Medical History: Last updated: 03/23/2007 Hypertension Hyperlipidemia chronic constipation GERD schatzki's ring Allergic rhinitis Benign prostatic hypertrophy eczema E.D. Nephrolithiasis, hx of  Past Surgical History: Last updated: 03/23/2007 Tonsillectomy  Family History: Last updated: 07/26/2007 Family History Lung cancer  Family History Hypertension COPD cancer brother smoking emphysema-brother smoking  Social History: Last updated: 04/17/2009 Former Smoker Alcohol use-no Married 2 children retired  Ambulance person lab for 20 yrs, later Herbalist for 17 yrs  Risk Factors: Smoking Status: quit (01/29/2009)  Review of Systems      See HPI  Vital Signs:  Patient profile:   77 year old  male Height:      72 inches Weight:      208.13 pounds BMI:     28.33 O2 Sat:      96 % on Room air Temp:     97.0 degrees F oral Pulse rate:   68 / minute BP sitting:   146 / 84  (right arm) Cuff size:   regular  Vitals Entered By: Parke Poisson CNA (April 28, 2009 3:09 PM)  O2 Flow:  Room air CC: wheezing esp when lying down, chest congestion, dry cough x1week - denies increased SOB Is Patient Diabetic? No Comments Medications reviewed with patient Daytime contact number verified with patient. Parke Poisson CNA  April 28, 2009 3:13 PM    Physical Exam  Additional Exam:  GEN: A/Ox3; pleasant , NAD HEENT:  [place]/AT, , EACs-clear, TMs-wnl, NOSE-clear, THROAT-clear NECK:  Supple w/ fair ROM; no JVD; normal carotid impulses w/o bruits; no thyromegaly or nodules palpated; no lymphadenopathy. RESP  Coarse BS w/ upper airway psuedowheezing.  CARD:  RRR, no m/r/g   GI:   Soft & nt; nml bowel sounds; no organomegaly or masses detected. Musco: Warm bil,  no calf tenderness edema, clubbing, pulses intact     Impression & Recommendations:  Problem # 1:  WHEEZING (ICD-786.07)  Psuedowheezing ? LPR vs rhinitis  REC: recent cxr reveiwed which was nml.  Prednisone taper over next week.  Work on not coughing or throat clearing, use sugarless candy , NO MINTS Delsym 2 tsp every 12 hr as needed cough Prevacid 30mg  once daily  Zyrtec 10mg  at bedtime  Hold fish oil follow up 2 weeks  for PFTs and follow up with Dr. Elsworth Soho   Orders: Est. Patient Level IV [id number])  Medications Added to Medication List This Visit: 1)  Tamsulosin Hcl 0.4 Mg Caps (Tamsulosin hcl) .... Take 1 capsule by mouth once a day 2)  Prevacid Solutab 30 Mg Tbdp (Lansoprazole) .... Take 1 tablet 30 minutes before breakfast daily as needed 3)  Prednisone 10 Mg Tabs (Prednisone) .... 4 tabs for 2 days, then 3 tabs for 2 days, 2 tabs for 2 days, then 1 tab for 2 days, then stop  Complete Medication List: 1)  Centrum  Silver Ultra Mens Tabs (Multiple vitamins-minerals) .Marland Kitchen.. 1 by mouth once daily 2)  Tamsulosin Hcl 0.4 Mg Caps (Tamsulosin hcl) .... Take 1 capsule by mouth once a day 3)  Prevacid Solutab 30 Mg Tbdp (Lansoprazole) .... Take 1 tablet 30 minutes before breakfast daily as needed 4)  Dulcolax 5 Mg Tbec (Bisacodyl) .... As needed 5)  Dilt-xr 180 Mg Xr24h-cap (Diltiazem hcl) .Marland Kitchen.. 1 by mouth once daily 6)  Proair Hfa 108 (90 Base) Mcg/act Aers (Albuterol sulfate) .... 2 puffs q 6 hrs as needed 7)  Vitamin D 1000 Unit Caps (Cholecalciferol) .Marland Kitchen.. 1 by mouth once daily 8)  Crestor 20 Mg Tabs (Rosuvastatin calcium) .Marland Kitchen.. 1 by mouth once daily 9)  Fish Oil 1200 Mg Caps (Omega-3 fatty acids) .Marland Kitchen.. 1 by mouth once daily 10)  Prednisone 10 Mg Tabs (Prednisone) .... 4 tabs for 2 days, then 3 tabs  for 2 days, 2 tabs for 2 days, then 1 tab for 2 days, then stop   Patient Instructions: 1)  Prednisone taper over next week.  2)  Work on not coughing or throat clearing, use sugarless candy , NO MINTS 3)  Delsym 2 tsp every 12 hr as needed cough 4)  Prevacid 30mg  once daily  5)  Zyrtec 10mg  at bedtime  6)  Hold fish oil 7)  follow up 2 weeks  for PFTs and follow up with Dr. Elsworth Soho  Prescriptions: PREDNISONE 10 MG TABS (PREDNISONE) 4 tabs for 2 days, then 3 tabs for 2 days, 2 tabs for 2 days, then 1 tab for 2 days, then stop  #20 x 0   Entered and Authorized by:   Rexene Edison NP   Signed by:   Suad Autrey NP on 04/28/2009   Method used:   Electronically to        [facility]. # [id number]* (retail)       [address]       [place], [place]  [address]       Ph: [phone] or [phone]       Fax: [phone]   RxID:   [id number]

## 2010-05-04 NOTE — Assessment & Plan Note (Signed)
Summary: ROV 2 MONTHS///KP   Primary Provider/Referring Provider:  Jenny Reichmann  CC:  Pt is here for a 2 month f/u appt.  Pt states breathing has improved while on Qvar. Pt states he only takes Qvar prn .Marland Kitchen  History of Present Illness: 77 yo with hx of wheezing seen initially 4/09 for evaluation.   4/09-- Episode of bronchitis in 1/09 with dyspnea/ wheezing preceded by URI, required steroid shot. Had persistent wheezing afterwards requiring albuterol MDI . Occ nocturnal wheezing described by wife, this has improved after he changed bedrooms. No childhood h/o asthma, smoked a little bit as a teenager, no pets at home or other exposures, Worked as a Geneticist, molecular , then [employer].  May 14, 2009 4:15 PM  Did well since last seen until last 4 months. Wheezing returned requiring acute office visit, Worse at night, cold air, prolonged talking. He had stopped advair. Psuedowheezing noted? LPR vs rhinitis Improved with prednisone taper, zyrtec & prevacid. CRX nml 01/29/09. Dyspnea seems not to be a problem. PFTs mild reversible airway obstruction esp smaller airways, preserved TLC & diffusion  July 22, 2009 2:12 PM  Feesl better, taking qvar as needed  - about 3-4/wk, & prevacid as needed once/wk  c/o dyspnea  when lifting objects , but can walk on treadmill x 30 mins Spirometry - no obstruction, moderate restriction  Denies chest pain,  orthopnea, hemoptysis, fever, n/v/d, edema, headache.   Medications Prior to Update: 1)  Centrum Silver Ultra Mens  Tabs (Multiple Vitamins-Minerals) .Marland Kitchen.. 1 By Mouth Once Daily 2)  Tamsulosin Hcl 0.4 Mg Caps (Tamsulosin Hcl) .... Take 1 Capsule By Mouth Once A Day 3)  Prevacid Solutab 30 Mg Tbdp (Lansoprazole) .... Take 1 Tablet 30 Minutes Before Breakfast Daily As Needed 4)  Dulcolax 5 Mg  Tbec (Bisacodyl) .... As Needed 5)  Dilt-Xr 180 Mg Xr24h-Cap (Diltiazem Hcl) .Marland Kitchen.. 1 By Mouth Once Daily 6)  Proair Hfa 108 (90 Base) Mcg/act Aers (Albuterol Sulfate)  .... 2 Puffs Q 6 Hrs As Needed 7)  Vitamin D 1000 Unit Caps (Cholecalciferol) .Marland Kitchen.. 1 By Mouth Once Daily 8)  Crestor 20 Mg Tabs (Rosuvastatin Calcium) .... As Directed 9)  Fish Oil 1200 Mg Caps (Omega-3 Fatty Acids) .Marland Kitchen.. 1 By Mouth Once Daily 10)  Qvar 80 Mcg/act Aers (Beclomethasone Dipropionate) .... 2 Puffs Two Times A Day  Allergies (verified): 1)  Cardura 2)  Lipitor (Atorvastatin)  Past History:  Past Medical History: Last updated: 03/23/2007 Hypertension Hyperlipidemia chronic constipation GERD schatzki's ring Allergic rhinitis Benign prostatic hypertrophy eczema E.D. Nephrolithiasis, hx of  Social History: Last updated: 04/17/2009 Former Smoker Alcohol use-no Married 2 children retired  Ambulance person lab for 20 yrs, later Herbalist for 17 yrs  Review of Systems  The patient denies anorexia, fever, weight loss, weight gain, vision loss, decreased hearing, hoarseness, chest pain, syncope, dyspnea on exertion, peripheral edema, prolonged cough, headaches, hemoptysis, abdominal pain, melena, hematochezia, severe indigestion/heartburn, hematuria, muscle weakness, difficulty walking, depression, unusual weight change, and abnormal bleeding.    Vital Signs:  Patient profile:   77 year old male Height:      72 inches Weight:      206 pounds O2 Sat:      95 % on Room air Temp:     97.8 degrees F oral Pulse rate:   73 / minute BP sitting:   130 / 80  (left arm) Cuff size:   regular  Vitals Entered By: Matthew Folks LPN (April 20, 624THL 579FGE PM)  O2 Flow:  Room air CC: Pt is here for a 2 month f/u appt.  Pt states breathing has improved while on Qvar. Pt states he only takes Qvar prn . Comments Medications reviewed with patient Matthew Folks LPN  April 20, 624THL 579FGE PM    Physical Exam  Additional Exam:  GEN: A/Ox3; pleasant , NAD HEENT:  [place]/AT, , EACs-clear, TMs-wnl, NOSE-clear, THROAT-clear NECK:  Supple w/ fair ROM; no JVD; normal carotid impulses w/o  bruits; no thyromegaly or nodules palpated; no lymphadenopathy. RESP  Coarse BS w/ upper airway psuedowheezing.  CARD:  RRR, no m/r/g   Musco: Warm bil,  no calf tenderness edema, clubbing, pulses intact     Pulmonary Function Test Date: 07/22/2009 2:21 PM Gender: Male  Pre-Spirometry FVC    Value: 2.51 L/min   % Pred: 57.60 % FEV1    Value: 1.91 L     Pred: 3.12 L     % Pred: 61.20 % FEV1/FVC  Value: 76.16 %     % Pred: 106.10 %  Impression & Recommendations:  Problem # 1:  WHEEZING (ICD-786.07)  No obstruction on pFTs but response to qvar & prednisone makes reactive airway disease possible. Other possibility is neuromuscular disease causing dyspnea. stay on qvar for now - will not insist on daily use.  Orders: Est. Patient Level III [id number]) Spirometry w/Graph [id number]) Prescription Created Electronically [phone])  Problem # 2:  GERD (ICD-530.81)  His updated medication list for this problem includes:    Prevacid Solutab 30 Mg Tbdp (Lansoprazole) .Marland Kitchen... Take 1 tablet 30 minutes before breakfast daily as needed  Orders: Est. Patient Level III [id number]) Spirometry w/Graph [id number]) Prescription Created Electronically [phone])  Patient Instructions: 1)  Copy sent to: Dr Jenny Reichmann 2)  Please schedule a follow-up appointment in 4 months with TP 3)  Stay on qvar at bedtime  4)  Keep reflux pill as needed    [employer] Spirometry  ID: [id number] Patient: MATHER, KLADIS DOB: [birth year] Age: 77 Years Old Sex: Male Race: White Physician: Biagio Borg MD Height: 72 Weight: 206 Status: Confirmed Past Medical History:  Hypertension Hyperlipidemia chronic constipation GERD schatzki's ring Allergic rhinitis Benign prostatic hypertrophy eczema E.D. Nephrolithiasis, hx of  Recorded: 07/22/2009 2:21 PM  Parameter  Measured Predicted %Predicted FVC     2.51        4.35        57.60 FEV1     1.91        3.12        61.20 FEV1%   76.16        71.74        106.10 PEF    5.34         7.74        69   Interpretation: No airway obstruction Moderately severe restriction

## 2010-05-04 NOTE — Progress Notes (Signed)
Summary: Benicar  Phone Note Call from Patient Call back at Home Phone [phone]   Complaint: Breathing Problems Summary of Call: Patient left message on triage that he would like Benicar re-issued. Initial call taken by: Ernestene Mention CMA,  November 25, 2009 10:10 AM  Follow-up for Phone Call        [facility] to resume Benicar 20mg  1 by mouth once daily and stop Diltiazem? Please advise. Xavious Sellards CMA  November 25, 2009 10:28 AM   Additional Follow-up for Phone Call Additional follow up Details #1::        please ask pt why he wants to go back to benicar 20 mg;  should be ok but it is more expensive and I was wondering why he feels he wants to do this  Pt states that [place] worked better at controlling his BP. Pt requests an Rx to [place] and is it is too expensive he will continue with current medication and make ROV to discuss with MD Additional Follow-up by: Crissie Sickles, CMA,  November 25, 2009 1:32 PM    New/Updated Medications: BENICAR 20 MG TABS (OLMESARTAN MEDOXOMIL) 1 by mouth once daily Prescriptions: BENICAR 20 MG TABS (OLMESARTAN MEDOXOMIL) 1 by mouth once daily  #30 x 5   Entered by:   Crissie Sickles, CMA   Authorized by:   Biagio Borg MD   Signed by:   Crissie Sickles, CMA on 11/25/2009   Method used:   Electronically to        [facility]. # [id number]* (retail)       [address]       [place], [place]  [address]       Ph: [phone] or [phone]       Fax: [phone]   RxID:   [id number]

## 2010-05-04 NOTE — Assessment & Plan Note (Signed)
Summary: 1 MO ROV / TO COME FASTING/NWS #   Vital Signs:  Patient profile:   77 year old male Height:      72 inches Weight:      206 pounds BMI:     28.04 O2 Sat:      96 % on Room air Temp:     97.7 degrees F oral Pulse rate:   72 / minute BP sitting:   116 / 80  (left arm) Cuff size:   regular  Vitals Entered ByShirlean Mylar Ewing (April 17, 2009 10:20 AM)  O2 Flow:  Room air  Preventive Care Screening  Last Flu Shot:    Date:  02/02/2009    Results:  given   CC: 1 Mo ROV/RE   Primary Care Provider:  Jenny Reichmann  CC:  1 Mo ROV/RE.  History of Present Illness: has some exp wheezing lying down at night only - better with the inhaler and minor sob at best;  has run out of the advair sample that seemd to help as well;  Pt denies CP, worsening sob, doe,  orthopnea, pnd, worsening LE edema, palps, dizziness or syncope  Pt denies new neuro symptoms such as headache, facial or extremity weakness .  Does have decereased libido and some Ed worsening over the past yr,  denies depressive symtpoms or incr anxiety .  OVerall good coimplaince with meds, tolerating well.   Problems Prior to Update: 1)  Rash-nonvesicular  (ICD-782.1) 2)  Cough  (ICD-786.2) 3)  Hepatotoxicity, Drug-induced, Risk of  (ICD-V58.69) 4)  Bursitis, Right Hip  (ICD-726.5) 5)  Fatigue  (ICD-780.79) 6)  Psa, Increased  (ICD-790.93) 7)  Sciatica, Right  (ICD-724.3) 8)  Schatzki's Ring  (ICD-530.3) 9)  Hiatal Hernia  (ICD-553.3) 10)  Abnormal Electrocardiogram  (ICD-794.31) 11)  Preventive Health Care  (ICD-V70.0) 12)  Asthmatic Bronchitis, Acute  (ICD-466.0) 13)  Bronchitis Not Specified As Acute or Chronic  (ICD-490) 14)  Rash-nonvesicular  (ICD-782.1) 15)  Nephrolithiasis, Hx of  (ICD-V13.01) 16)  Erectile Dysfunction  (ICD-302.72) 17)  Benign Prostatic Hypertrophy  (ICD-600.00) 18)  Allergic Rhinitis  (ICD-477.9) 19)  Gerd  (ICD-530.81) 20)  Hyperlipidemia  (ICD-272.4) 21)  Hypertension   (ICD-401.9)  Medications Prior to Update: 1)  Centrum Silver Ultra Mens  Tabs (Multiple Vitamins-Minerals) .Marland Kitchen.. 1 By Mouth Once Daily 2)  Flomax 0.4 Mg Cp24 (Tamsulosin Hcl) .... Take 1 Capsule By Mouth Daily 3)  Cod Liver Oil 1000 Mg  Caps (Cod Liver Oil) .... Once Daily 4)  Prevacid Solutab 30 Mg Tbdp (Lansoprazole) .... Take 1 Tablet 30 Minutes Before Breakfast Daily. 5)  Dulcolax 5 Mg  Tbec (Bisacodyl) .... As Needed 6)  Dilt-Xr 180 Mg Xr24h-Cap (Diltiazem Hcl) .Marland Kitchen.. 1 By Mouth Once Daily 7)  Fluticasone Propionate 50 Mcg/act Susp (Fluticasone Propionate) .... Spray 2 Spray Into Both  Nostrils Once A Day 8)  Proair Hfa 108 (90 Base) Mcg/act Aers (Albuterol Sulfate) .... 2 Puffs Q 6 Hrs As Needed 9)  Advair Diskus 250-50 Mcg/dose Misc (Fluticasone-Salmeterol) .Marland Kitchen.. 1 Inh Two Times A Day 10)  Vitamin D 1000 Unit Caps (Cholecalciferol) .Marland Kitchen.. 1 By Mouth Once Daily 11)  Crestor 20 Mg Tabs (Rosuvastatin Calcium) .Marland Kitchen.. 1 By Mouth Once Daily 12)  Fish Oil 1200 Mg Caps (Omega-3 Fatty Acids) .Marland Kitchen.. 1 By Mouth Once Daily 13)  Flax Seeds  Powd (Flaxseed (Linseed)) .Marland Kitchen.. 1 Tablespoon  in Cereal Everyday 14)  Lotrisone 1-0.05 % Crea (Clotrimazole-Betamethasone) .... Use Asd Two Times A Day As  Needed To Affected Area  Current Medications (verified): 1)  Centrum Silver Ultra Mens  Tabs (Multiple Vitamins-Minerals) .Marland Kitchen.. 1 By Mouth Once Daily 2)  Flomax 0.4 Mg Cp24 (Tamsulosin Hcl) .... Take 1 Capsule By Mouth Daily 3)  Prevacid Solutab 30 Mg Tbdp (Lansoprazole) .... Take 1 Tablet 30 Minutes Before Breakfast Daily. 4)  Dulcolax 5 Mg  Tbec (Bisacodyl) .... As Needed 5)  Dilt-Xr 180 Mg Xr24h-Cap (Diltiazem Hcl) .Marland Kitchen.. 1 By Mouth Once Daily 6)  Fluticasone Propionate 50 Mcg/act Susp (Fluticasone Propionate) .... Spray 2 Spray Into Both  Nostrils Once A Day 7)  Proair Hfa 108 (90 Base) Mcg/act Aers (Albuterol Sulfate) .... 2 Puffs Q 6 Hrs As Needed 8)  Advair Diskus 250-50 Mcg/dose Misc (Fluticasone-Salmeterol) .Marland Kitchen..  1 Inh Two Times A Day 9)  Vitamin D 1000 Unit Caps (Cholecalciferol) .Marland Kitchen.. 1 By Mouth Once Daily 10)  Crestor 20 Mg Tabs (Rosuvastatin Calcium) .Marland Kitchen.. 1 By Mouth Once Daily 11)  Fish Oil 1200 Mg Caps (Omega-3 Fatty Acids) .Marland Kitchen.. 1 By Mouth Once Daily  Allergies (verified): 1)  Cardura 2)  Lipitor (Atorvastatin)  Past History:  Past Medical History: Last updated: 03/23/2007 Hypertension Hyperlipidemia chronic constipation GERD schatzki's ring Allergic rhinitis Benign prostatic hypertrophy eczema E.D. Nephrolithiasis, hx of  Past Surgical History: Last updated: 03/23/2007 Tonsillectomy  Social History: Last updated: 04/17/2009 Former Smoker Alcohol use-no Married 2 children retired  Ambulance person lab for 20 yrs, later Herbalist for 17 yrs  Risk Factors: Smoking Status: quit (01/29/2009)  Social History: Reviewed history from 04/21/2007 and no changes required. Former Smoker Alcohol use-no Married 2 children retired  Ambulance person lab for 20 yrs, later Herbalist for 17 yrs  Review of Systems       all otherwise negative per pt - 12 system review done   Physical Exam  General:  alert and overweight-appearing.   Head:  normocephalic and atraumatic.   Eyes:  vision grossly intact, pupils equal, and pupils round.   Ears:  R ear normal and L ear normal.   Nose:  no external deformity and no nasal discharge.   Mouth:  no gingival abnormalities and pharynx pink and moist.   Neck:  supple and no masses.   Lungs:  normal respiratory effort, R decreased breath sounds, and L decreased breath sounds.   Heart:  normal rate and regular rhythm.   Extremities:  no edema, no erythema    Impression & Recommendations:  Problem # 1:  WHEEZING (ICD-786.07) improved with meds - suspect some underlying  copd - Continue all previous medications as before this visit ; stable overall by hx and exam, ok to continue meds/tx as is   Problem # 2:  ERECTILE DYSFUNCTION, ORGANIC  (ICD-607.84) pt requests testosterone check ; declines viagra for now  Problem # 3:  HYPERTENSION (ICD-401.9)  His updated medication list for this problem includes:    Dilt-xr 180 Mg Xr24h-cap (Diltiazem hcl) .Marland Kitchen... 1 by mouth once daily  BP today: 116/80 Prior BP: 130/82 (03/16/2009)  Labs Reviewed: K+: 4.7 (06/17/2008) Creat: : 1.1 (06/17/2008)   Chol: 234 (09/03/2008)   HDL: 41.90 (09/03/2008)   LDL: 99 (06/14/2007)   TG: 223.0 (09/03/2008) stable overall by hx and exam, ok to continue meds/tx as is   Problem # 4:  HYPERLIPIDEMIA (ICD-272.4)  His updated medication list for this problem includes:    Crestor 20 Mg Tabs (Rosuvastatin calcium) .Marland Kitchen... 1 by mouth once daily  Labs Reviewed: SGOT: 23 (09/03/2008)  SGPT: 16 (09/03/2008)   HDL:41.90 (09/03/2008), 44.30 (06/17/2008)  LDL:99 (06/14/2007), DEL (06/06/2006)  Chol:234 (09/03/2008), 249 (06/17/2008)  Trig:223.0 (09/03/2008), 130.0 (06/17/2008) stable overall by hx and exam, ok to continue meds/tx as is ; Pt to continue diet efforts, good med tolerance; to check labs - goal LDL less than 70   Complete Medication List: 1)  Centrum Silver Ultra Mens Tabs (Multiple vitamins-minerals) .Marland Kitchen.. 1 by mouth once daily 2)  Flomax 0.4 Mg Cp24 (Tamsulosin hcl) .... Take 1 capsule by mouth daily 3)  Prevacid Solutab 30 Mg Tbdp (Lansoprazole) .... Take 1 tablet 30 minutes before breakfast daily. 4)  Dulcolax 5 Mg Tbec (Bisacodyl) .... As needed 5)  Dilt-xr 180 Mg Xr24h-cap (Diltiazem hcl) .Marland Kitchen.. 1 by mouth once daily 6)  Fluticasone Propionate 50 Mcg/act Susp (Fluticasone propionate) .... Spray 2 spray into both  nostrils once a day 7)  Proair Hfa 108 (90 Base) Mcg/act Aers (Albuterol sulfate) .... 2 puffs q 6 hrs as needed 8)  Advair Diskus 250-50 Mcg/dose Misc (Fluticasone-salmeterol) .Marland Kitchen.. 1 inh two times a day 9)  Vitamin D 1000 Unit Caps (Cholecalciferol) .Marland Kitchen.. 1 by mouth once daily 10)  Crestor 20 Mg Tabs (Rosuvastatin calcium) .Marland Kitchen.. 1 by  mouth once daily 11)  Fish Oil 1200 Mg Caps (Omega-3 fatty acids) .Marland Kitchen.. 1 by mouth once daily  Patient Instructions: 1)  please have blood work done Mon jan 17 with: 2)  Lipid Panel prior to visit, ICD-9: 3)  Hepatic Panel prior to visit: v58.69 4)  total testosterone:  607.84 5)  PSA :  790.93 6)  Please schedule a follow-up appointment in 6 months. Prescriptions: ADVAIR DISKUS 250-50 MCG/DOSE MISC (FLUTICASONE-SALMETEROL) 1 inh two times a day  #1 x 11   Entered and Authorized by:   Biagio Borg MD   Signed by:   Biagio Borg MD on 04/17/2009   Method used:   Print then Give to Patient   RxID:   [id number]

## 2010-05-04 NOTE — Assessment & Plan Note (Signed)
Summary: 4 month follow up//jwr   Primary Provider/Referring Provider:  Jenny Reichmann  CC:  6 mth rov per Dr Elsworth Soho - Need refill on Qvar.  History of Present Illness: 77 yo with hx of wheezing seen initially 4/09 for evaluation.   4/09-- Episode of bronchitis in 1/09 with dyspnea/ wheezing preceded by URI, required steroid shot. Had persistent wheezing afterwards requiring albuterol MDI . Occ nocturnal wheezing described by wife, this has improved after he changed bedrooms. No childhood h/o asthma, smoked a little bit as a teenager, no pets at home or other exposures, Worked as a Geneticist, molecular , then [employer].  May 14, 2009 4:15 PM  Did well since last seen until last 4 months. Wheezing returned requiring acute office visit, Worse at night, cold air, prolonged talking. He had stopped advair. Psuedowheezing noted? LPR vs rhinitis Improved with prednisone taper, zyrtec & prevacid. CRX nml 01/29/09. Dyspnea seems not to be a problem. PFTs mild reversible airway obstruction esp smaller airways, preserved TLC & diffusion  July 22, 2009 2:12 PM  Feelsl better, taking qvar as needed  - about 3-4/wk, & prevacid as needed once/wk  c/o dyspnea  when lifting objects , but can walk on treadmill x 30 mins Spirometry - no obstruction, moderate restriction   January 13, 2010 --Pressents for follow up . He needs  refill on Qvar . Uses QVAR 2 puffs qhs. He feels good, no significant complaints. Has occasional wheezing at night but as long as he take QVAR this does not bother him.  Stays acitive. Denies chest pain, dyspnea, orthopnea, hemoptysis, fever, n/v/d, edema, headache.     Current Medications (verified): 1)  Centrum Silver Ultra Mens  Tabs (Multiple Vitamins-Minerals) .Marland Kitchen.. 1 By Mouth Once Daily 2)  Tamsulosin Hcl 0.4 Mg Caps (Tamsulosin Hcl) .... Take 1 Capsule By Mouth Once A Day 3)  Prevacid Solutab 30 Mg Tbdp (Lansoprazole) .... Take 1 Tablet 30 Minutes Before Breakfast Daily As Needed 4)   Dulcolax 5 Mg  Tbec (Bisacodyl) .... As Needed 5)  Diovan 160 Mg Tabs (Valsartan) .Marland Kitchen.. 1po Once Daily 6)  Proair Hfa 108 (90 Base) Mcg/act Aers (Albuterol Sulfate) .... 2 Puffs Q 6 Hrs As Needed 7)  Vitamin D 1000 Unit Caps (Cholecalciferol) .Marland Kitchen.. 1 By Mouth Once Daily 8)  Crestor 20 Mg Tabs (Rosuvastatin Calcium) .... As Directed 9)  Fish Oil 1200 Mg Caps (Omega-3 Fatty Acids) .Marland Kitchen.. 1 By Mouth Once Daily 10)  Qvar 80 Mcg/act Aers (Beclomethasone Dipropionate) .... 2 Puffs Two Times A Day 11)  Triamcinolone Acetonide 0.1 % Crea (Triamcinolone Acetonide) .... Use Asd Two Times A Day As Needed 12)  Fluticasone Propionate 50 Mcg/act Susp (Fluticasone Propionate) .... 2 Spray/side Once Daily  Allergies (verified): 1)  Cardura 2)  Lipitor (Atorvastatin)  Comments:  Nurse/Medical Assistant: The patient's medications and allergies were reviewed with the patient and were updated in the Medication and Allergy Lists.  Past History:  Past Medical History: Last updated: 10/07/2009 Hypertension Hyperlipidemia chronic constipation  -  Dr Dennie Fetters GERD schatzki's ring Allergic rhinitis Benign prostatic hypertrophy eczema E.D. Nephrolithiasis, hx of Wheezing/reactive airways disease -   Dr Mia Creek  Past Surgical History: Last updated: 03/23/2007 Tonsillectomy  Family History: Last updated: 07/26/2007 Family History Lung cancer Family History Hypertension COPD cancer brother smoking emphysema-brother smoking  Social History: Last updated: 10/07/2009 Former Smoker Alcohol use-no Married 2 children retired  Ambulance person lab for 20 yrs, later Herbalist for 17 yrs Drug use-no  Review of Systems  See HPI  Vital Signs:  Patient profile:   77 year old male Weight:      203 pounds O2 Sat:      94 % on Room air Temp:     96.9 degrees F oral Pulse rate:   85 / minute BP sitting:   152 / 84  (right arm) Cuff size:   regular  Vitals Entered By: Doroteo Glassman RN (January 13, 2010 9:58 AM)  O2 Flow:  Room air  Physical Exam  Additional Exam:  GEN: A/Ox3; pleasant , NAD HEENT:  [place]/AT, , EACs-clear, TMs-wnl, NOSE-clear, THROAT-clear NECK:  Supple w/ fair ROM; no JVD; normal carotid impulses w/o bruits; no thyromegaly or nodules palpated; no lymphadenopathy. RESP  CTA w/ no wheezing.  CARD:  RRR, no m/r/g   [place]co: Warm bil,  no calf tenderness edema, clubbing, pulses intact     Impression & Recommendations:  Problem # 1:  REACTIVE AIRWAY DISEASE (ICD-493.90)  Reactive airways Dz -under good control w/ QVAR.  Plan:  Flu shot today.  Decrease QVAR 55mcg 2 puffs at bedtime  follow up Dr. Elsworth Soho in 6 months and as needed   Orders: Est. Patient Level II [id number])  Medications Added to Medication List This Visit: 1)  Qvar 40 Mcg/act Aers (Beclomethasone dipropionate) .... 2 puffs at bedtime 2)  Proair Hfa 108 (90 Base) Mcg/act Aers (Albuterol sulfate) .... 2 puffs q 6 hrs as needed  Complete Medication List: 1)  Qvar 40 Mcg/act Aers (Beclomethasone dipropionate) .... 2 puffs at bedtime 2)  Centrum Silver Ultra Mens Tabs (Multiple vitamins-minerals) .Marland Kitchen.. 1 by mouth once daily 3)  Tamsulosin Hcl 0.4 Mg Caps (Tamsulosin hcl) .... Take 1 capsule by mouth once a day 4)  Prevacid Solutab 30 Mg Tbdp (Lansoprazole) .... Take 1 tablet 30 minutes before breakfast daily as needed 5)  Dulcolax 5 Mg Tbec (Bisacodyl) .... As needed 6)  Diovan 160 Mg Tabs (Valsartan) .Marland Kitchen.. 1po once daily 7)  Proair Hfa 108 (90 Base) Mcg/act Aers (Albuterol sulfate) .... 2 puffs q 6 hrs as needed 8)  Vitamin D 1000 Unit Caps (Cholecalciferol) .Marland Kitchen.. 1 by mouth once daily 9)  Crestor 20 Mg Tabs (Rosuvastatin calcium) .... As directed 10)  Fish Oil 1200 Mg Caps (Omega-3 fatty acids) .Marland Kitchen.. 1 by mouth once daily 11)  Triamcinolone Acetonide 0.1 % Crea (Triamcinolone acetonide) .... Use asd two times a day as needed 12)  Fluticasone Propionate 50 Mcg/act Susp (Fluticasone propionate) .... 2  spray/side once daily  Other Orders: Flu Vaccine 18yrs + MEDICARE PATIENTS [id number]) Administration Flu vaccine - MCR [id number])  Patient Instructions: 1)  Flu shot today.  2)  Decrease QVAR 48mcg 2 puffs at bedtime  3)  follow up Dr. Elsworth Soho in 6 months and as needed  Prescriptions: QVAR 40 MCG/ACT AERS (BECLOMETHASONE DIPROPIONATE) 2 puffs at bedtime  #1 x 5   Entered and Authorized by:   Rexene Edison NP   Signed by:   Raven Harmes NP on 01/13/2010   Method used:   Electronically to        [facility]. # [id number]* (retail)       [address]       [place], [place]  [address]       Ph: [phone] or [phone]       Fax: [phone]   RxID:   [id number] QVAR 80 MCG/ACT AERS (BECLOMETHASONE DIPROPIONATE) 2 puffs two times a  day  #1 x 11   Entered and Authorized by:   Rexene Edison NP   Signed by:   Aubrea Meixner NP on 01/13/2010   Method used:   Electronically to        [facility]. # [id number]* (retail)       [address]       [place], [place]  [address]       Ph: [phone] or [phone]       Fax: [phone]   [id number]     Flu Vaccine Consent Questions     Do you have a history of severe allergic reactions to this vaccine? no    Any prior history of allergic reactions to egg and/or gelatin? no    Do you have a sensitivity to the preservative Thimersol? no    Do you have a past history of Guillan-Barre Syndrome? no    Do you currently have an acute febrile illness? no    Have you ever had a severe reaction to latex? no    Vaccine information given and explained to patient? yes    Are you currently pregnant? no    Lot Number:[id number]   Exp Date:10/02/2010   Site Given  Left Deltoid IMdflu Doroteo Glassman RN  January 13, 2010 10:24 AM

## 2010-05-04 NOTE — Assessment & Plan Note (Signed)
Summary: 3 MO ROV /NWS $50   Vital Signs:  Patient profile:   77 year old male Height:      72 inches Weight:      206.13 pounds BMI:     28.06 O2 Sat:      93 % Temp:     98.3 degrees F oral Pulse rate:   75 / minute BP sitting:   122 / 80  (left arm) Cuff size:   regular  Vitals Entered By: Hector Brunswick (June 27, 2008 10:09 AM) CC: f/u metabolic issues and BP   Primary Care Provider:  Jenny Reichmann   History of Present Illness: he states he believes the crestor cuased some arm bruises and simply does not want to take anymore; is willing to try a different statin such as lipitor which should cost less later this year after becoming generic; has ongoing mild fatigue as well but no better off the crestor; denies OSA symptoms; denies CP, sob, doe , fever , orthopnea, pnd or LE edema; good compliance o/w with other meds; has had also some ongoing recurrent sciatica on the right - has seen ortho, and to be set up for PT and f/u ortho per pt; specifically denies GU symptoms such as freq and dysuria, although still has some ongoing urianry flow decrease even on the flomax, but just not enough to consider changing meds at this pt (did not tolerate cardura before, might consider avodart type med though)  Problems Prior to Update: 1)  Fatigue  (ICD-780.79) 2)  Psa, Increased  (ICD-790.93) 3)  Sciatica, Right  (ICD-724.3) 4)  Schatzki's Ring  (ICD-530.3) 5)  Hiatal Hernia  (ICD-553.3) 6)  Abnormal Electrocardiogram  (ICD-794.31) 7)  Preventive Health Care  (ICD-V70.0) 8)  Asthmatic Bronchitis, Acute  (ICD-466.0) 9)  Bronchitis Not Specified As Acute or Chronic  (ICD-490) 10)  Rash-nonvesicular  (ICD-782.1) 11)  Nephrolithiasis, Hx of  (ICD-V13.01) 12)  Erectile Dysfunction  (ICD-302.72) 13)  Benign Prostatic Hypertrophy  (ICD-600.00) 14)  Allergic Rhinitis  (ICD-477.9) 15)  Gerd  (ICD-530.81) 16)  Hyperlipidemia  (ICD-272.4) 17)  Hypertension  (ICD-401.9)  Medications Prior to Update: 1)   One-A-Day Extras Antioxidant   Caps (Multiple Vitamins-Minerals) .Marland Kitchen.. 1 Once Daily 2)  Flomax 0.4 Mg Cp24 (Tamsulosin Hcl) .... Take 1 Capsule By Mouth Daily 3)  Crestor 40 Mg Tabs (Rosuvastatin Calcium) .... 1/2 Once Daily 4)  Cod Liver Oil 1000 Mg  Caps (Cod Liver Oil) .... Once Daily 5)  Prevacid 30 Mg Cpdr (Lansoprazole) .... Once Daily As Needed 6)  Dulcolax 5 Mg  Tbec (Bisacodyl) .... As Needed 7)  Dilt-Xr 180 Mg Xr24h-Cap (Diltiazem Hcl) .Marland Kitchen.. 1 By Mouth Once Daily 8)  Fluticasone Propionate 50 Mcg/act Susp (Fluticasone Propionate) .... Spray 2 Spray Into Both  Nostrils Once A Day 9)  Ester-C   Tbcr (Bioflavonoid Products) .Marland Kitchen.. 1 By Mouth Qd 10)  Cialis 20 Mg  Tabs (Tadalafil) .Marland Kitchen.. 1po Once Daily Prn 11)  Proair Hfa 108 (90 Base) Mcg/act Aers (Albuterol Sulfate) .... 2 Puffs Q 6 Hrs As Needed 12)  Advair Diskus 250-50 Mcg/dose Misc (Fluticasone-Salmeterol) .Marland Kitchen.. 1 Inh Two Times A Day 13)  Prednisone 10 Mg Tabs (Prednisone) .... 4po Qd For 3days, Then 3po Qd For 3days, Then 2po Qd For 3days, Then 1po Qd For 3 Days, Then Stop 14)  Tramadol Hcl 50 Mg Tabs (Tramadol Hcl) .Marland Kitchen.. 1 - 2 By Mouth Q 6 Hrs As Needed Pain  Current Medications (verified): 1)  Centrum Silver  Ultra Mens  Tabs (Multiple Vitamins-Minerals) .Marland Kitchen.. 1 By Mouth Once Daily 2)  Flomax 0.4 Mg Cp24 (Tamsulosin Hcl) .... Take 1 Capsule By Mouth Daily 3)  Lipitor 40 Mg Tabs (Atorvastatin Calcium) .Marland Kitchen.. 1po Once Daily 4)  Cod Liver Oil 1000 Mg  Caps (Cod Liver Oil) .... Once Daily 5)  Prevacid 30 Mg Cpdr (Lansoprazole) .... Once Daily As Needed 6)  Dulcolax 5 Mg  Tbec (Bisacodyl) .... As Needed 7)  Dilt-Xr 180 Mg Xr24h-Cap (Diltiazem Hcl) .Marland Kitchen.. 1 By Mouth Once Daily 8)  Fluticasone Propionate 50 Mcg/act Susp (Fluticasone Propionate) .... Spray 2 Spray Into Both  Nostrils Once A Day 9)  Cialis 20 Mg  Tabs (Tadalafil) .Marland Kitchen.. 1po Once Daily Prn 10)  Proair Hfa 108 (90 Base) Mcg/act Aers (Albuterol Sulfate) .... 2 Puffs Q 6 Hrs As  Needed 11)  Advair Diskus 250-50 Mcg/dose Misc (Fluticasone-Salmeterol) .Marland Kitchen.. 1 Inh Two Times A Day 12)  Prednisone 10 Mg Tabs (Prednisone) .... 4po Qd For 3days, Then 3po Qd For 3days, Then 2po Qd For 3days, Then 1po Qd For 3 Days, Then Stop 13)  Tramadol Hcl 50 Mg Tabs (Tramadol Hcl) .Marland Kitchen.. 1 - 2 By Mouth Q 6 Hrs As Needed Pain 14)  Vitamin D 1000 Unit Caps (Cholecalciferol) .Marland Kitchen.. 1 By Mouth Once Daily 15)  Ciprofloxacin Hcl 500 Mg Tabs (Ciprofloxacin Hcl) .Marland Kitchen.. 1 By Mouth Two Times A Day  Allergies (verified): 1)  Cardura  Past History:  Past Medical History:    Hypertension    Hyperlipidemia    chronic constipation    GERD    schatzki's ring    Allergic rhinitis    Benign prostatic hypertrophy    eczema    E.D.    Nephrolithiasis, hx of     (03/23/2007)  Past Surgical History:    Tonsillectomy     (03/23/2007)  Social History:    Former Smoker    Alcohol use-no    Married     (04/21/2007)  Risk Factors:    Alcohol Use: N/A    >5 drinks/d w/in last 3 months: N/A    Caffeine Use: N/A    Diet: N/A    Exercise: N/A  Risk Factors:    Smoking Status: quit (03/23/2007)    Packs/Day: N/A    Cigars/wk: N/A    Pipe Use/wk: N/A    Cans of tobacco/wk: N/A    Passive Smoke Exposure: N/A  Social History:    Reviewed history from 04/21/2007 and no changes required:       Former Smoker       Alcohol use-no       Married  Review of Systems       all otherwise negative except some ongoing fatigue  Physical Exam  General:  alert and overweight-appearing.   Head:  normocephalic and atraumatic.   Eyes:  vision grossly intact, pupils equal, and pupils round.   Ears:  R ear normal and L ear normal.   Nose:  no external deformity and no nasal discharge.   Mouth:  no gingival abnormalities and pharynx pink and moist.   Neck:  supple and no masses.   Lungs:  normal respiratory effort and normal breath sounds.   Heart:  normal rate and regular rhythm.   Abdomen:  soft,  non-tender, and normal bowel sounds.   Msk:  no joint tenderness and no joint swelling.   Extremities:  no edema, no ulcers    Impression & Recommendations:  Problem # 1:  HYPERLIPIDEMIA (ICD-272.4)  His updated medication list for this problem includes:    Lipitor 40 Mg Tabs (Atorvastatin calcium) .Marland Kitchen... 1po once daily treat as above, f/u any worsening signs or symptoms - to check labs next visit  Problem # 2:  PSA, INCREASED (ICD-790.93) try cipro course for 4 wks, f/u next visit  Problem # 3:  FATIGUE (ICD-780.79) recent labs neg - ok to follow, exam o/w benign  Problem # 4:  HYPERTENSION (ICD-401.9)  His updated medication list for this problem includes:    Dilt-xr 180 Mg Xr24h-cap (Diltiazem hcl) .Marland Kitchen... 1 by mouth once daily  BP today: 122/80 Prior BP: 120/66 (04/28/2008)  Labs Reviewed: K+: 4.7 (06/17/2008) Creat: : 1.1 (06/17/2008)   Chol: 249 (06/17/2008)   HDL: 44.30 (06/17/2008)   LDL: 99 (06/14/2007)   TG: 130.0 (06/17/2008) stable overall by hx and exam, ok to continue meds/tx as is   Complete Medication List: 1)  Centrum Silver Ultra Mens Tabs (Multiple vitamins-minerals) .Marland Kitchen.. 1 by mouth once daily 2)  Flomax 0.4 Mg Cp24 (Tamsulosin hcl) .... Take 1 capsule by mouth daily 3)  Lipitor 40 Mg Tabs (Atorvastatin calcium) .Marland Kitchen.. 1po once daily 4)  Cod Liver Oil 1000 Mg Caps (Cod liver oil) .... Once daily 5)  Prevacid 30 Mg Cpdr (Lansoprazole) .... Once daily as needed 6)  Dulcolax 5 Mg Tbec (Bisacodyl) .... As needed 7)  Dilt-xr 180 Mg Xr24h-cap (Diltiazem hcl) .Marland Kitchen.. 1 by mouth once daily 8)  Fluticasone Propionate 50 Mcg/act Susp (Fluticasone propionate) .... Spray 2 spray into both  nostrils once a day 9)  Cialis 20 Mg Tabs (Tadalafil) .Marland Kitchen.. 1po once daily prn 10)  Proair Hfa 108 (90 Base) Mcg/act Aers (Albuterol sulfate) .... 2 puffs q 6 hrs as needed 11)  Advair Diskus 250-50 Mcg/dose Misc (Fluticasone-salmeterol) .Marland Kitchen.. 1 inh two times a day 12)  Prednisone 10 Mg  Tabs (Prednisone) .... 4po qd for 3days, then 3po qd for 3days, then 2po qd for 3days, then 1po qd for 3 days, then stop 13)  Tramadol Hcl 50 Mg Tabs (Tramadol hcl) .Marland Kitchen.. 1 - 2 by mouth q 6 hrs as needed pain 14)  Vitamin D 1000 Unit Caps (Cholecalciferol) .Marland Kitchen.. 1 by mouth once daily 15)  Ciprofloxacin Hcl 500 Mg Tabs (Ciprofloxacin hcl) .Marland Kitchen.. 1 by mouth two times a day  Patient Instructions: 1)  Please take all new medications as prescribed - the lipitor at 40 mg per day, and the cipro antibiotic for 4 wks 2)  Continue all medications that you may have been taking previously  3)  Please schedule a follow-up appointment in 1 month. Prescriptions: CIPROFLOXACIN HCL 500 MG TABS (CIPROFLOXACIN HCL) 1 by mouth two times a day  #60 x 0   Entered and Authorized by:   Biagio Borg MD   Signed by:   Biagio Borg MD on 06/27/2008   Method used:   Print then Give to Patient   RxID:   [id number] LIPITOR 40 MG TABS (ATORVASTATIN CALCIUM) 1po once daily  #30 x 11   Entered and Authorized by:   Biagio Borg MD   Signed by:   Biagio Borg MD on 06/27/2008   Method used:   Print then Give to Patient   RxID:   [id number]

## 2010-05-04 NOTE — Progress Notes (Signed)
Summary: prescription  Phone Note Call from Patient Call back at [phone]   Caller: Spouse Call For: alva Summary of Call: need prescript for qvar 80mg s [facility] [address] Initial call taken by: Gustavus Bryant,  July 02, 2009 9:46 AM  Follow-up for Phone Call        Pt wife aware qvar sent to [facility].  She verbalized understanding and had no further questions.  Follow-up by: Raymondo Band RN,  July 02, 2009 10:02 AM    New/Updated Medications: QVAR 80 MCG/ACT AERS (BECLOMETHASONE DIPROPIONATE) 2 puffs two times a day Prescriptions: QVAR 80 MCG/ACT AERS (BECLOMETHASONE DIPROPIONATE) 2 puffs two times a day  #1 x 3   Entered by:   Raymondo Band RN   Authorized by:   Leanna Sato. Elsworth Soho MD   Signed by:   Raymondo Band RN on 07/02/2009   Method used:   Electronically to        [facility]. # [id number]* (retail)       [address]       [place], [place]  [address]       Ph: [phone] or [phone]       Fax: [phone]   RxID:   [id number]

## 2010-05-04 NOTE — Progress Notes (Signed)
Summary: Flomax?  Phone Note Call from Patient Call back at Home Phone [phone]   Caller: Patient Summary of Call: pt called stating that Flomax is twice as expensive not that it has changed tiers. Pt is requesting an alt medication or generic.  Initial call taken by: Crissie Sickles, CMA,  April 24, 2009 2:33 PM  Follow-up for Phone Call        flomax is generic now; OK for pt to ask for generic or try different pharmacy if he is not getting the generic price Follow-up by: Biagio Borg MD,  April 24, 2009 3:51 PM  Additional Follow-up for Phone Call Additional follow up Details #1::        pt informed Additional Follow-up by: Crissie Sickles, [place],  April 24, 2009 4:11 PM

## 2010-05-06 NOTE — Assessment & Plan Note (Signed)
Summary: WHEEZING/NWS   Vital Signs:  Patient profile:   77 year old male Height:      72 inches Weight:      205.50 pounds BMI:     27.97 O2 Sat:      95 % on Room air Temp:     98.2 degrees F oral Pulse rate:   75 / minute BP sitting:   92 / 58  (left arm) Cuff size:   large  Vitals Entered By: Shirlean Mylar Ewing CMA Deborra Medina) (March 18, 2010 2:48 PM)  O2 Flow:  Room air CC: Congestion, wheezing/RE   Primary Care Provider:  Jenny Reichmann  CC:  Congestion and wheezing/RE.  History of Present Illness: here with acute onset midl to mod 3 days fever, ST, headache, general malasie and weakness, prod cough with greenish sputum, and midl wheezing/sob. Pt denies CP,orthopnea, pnd, worsening LE edema, palps, dizziness or syncope  .  Pt denies new neuro symptoms such as headache, facial or extremity weakness  Pt denies polydipsia, polyuria  Overall good compliance with meds, trying to follow low chol diet, wt stable, little excercise however No recent wt loss, night sweats, loss of appetite or other constitutional symptoms .  Overall good compliance with meds, and good tolerability., but despite daily flomax and no OTC decongestants or ther new meds he has had several motnhs of mild urinary slowing with weaker stream, but no dysuria, freq, urgency, hematuria, back pain.   Problems Prior to Update: 1)  Bronchitis-acute  (ICD-466.0) 2)  Reactive Airway Disease  (ICD-493.90) 3)  Preventive Health Care  (ICD-V70.0) 4)  Wheezing  (ICD-786.07) 5)  Erectile Dysfunction, Organic  (ICD-607.84) 6)  Rash-nonvesicular  (ICD-782.1) 7)  Cough  (ICD-786.2) 8)  Hepatotoxicity, Drug-induced, Risk of  (ICD-V58.69) 9)  Bursitis, Right Hip  (ICD-726.5) 10)  Fatigue  (ICD-780.79) 11)  Psa, Increased  (ICD-790.93) 12)  Sciatica, Right  (ICD-724.3) 13)  Schatzki's Ring  (ICD-530.3) 14)  Hiatal Hernia  (ICD-553.3) 15)  Abnormal Electrocardiogram  (ICD-794.31) 16)  Preventive Health Care  (ICD-V70.0) 17)  Asthmatic  Bronchitis, Acute  (ICD-466.0) 18)  Bronchitis Not Specified As Acute or Chronic  (ICD-490) 19)  Rash-nonvesicular  (ICD-782.1) 20)  Nephrolithiasis, Hx of  (ICD-V13.01) 21)  Erectile Dysfunction  (ICD-302.72) 22)  Benign Prostatic Hypertrophy  (ICD-600.00) 23)  Allergic Rhinitis  (ICD-477.9) 24)  Gerd  (ICD-530.81) 25)  Hyperlipidemia  (ICD-272.4) 26)  Hypertension  (ICD-401.9)  Medications Prior to Update: 1)  Qvar 40 Mcg/act Aers (Beclomethasone Dipropionate) .... 2 Puffs At Bedtime 2)  Centrum Silver Ultra Mens  Tabs (Multiple Vitamins-Minerals) .Marland Kitchen.. 1 By Mouth Once Daily 3)  Tamsulosin Hcl 0.4 Mg Caps (Tamsulosin Hcl) .... Take 1 Capsule By Mouth Once A Day 4)  Prevacid Solutab 30 Mg Tbdp (Lansoprazole) .... Take 1 Tablet 30 Minutes Before Breakfast Daily As Needed 5)  Dulcolax 5 Mg  Tbec (Bisacodyl) .... As Needed 6)  Diovan 160 Mg Tabs (Valsartan) .Marland Kitchen.. 1po Once Daily 7)  Proair Hfa 108 (90 Base) Mcg/act Aers (Albuterol Sulfate) .... 2 Puffs Q 6 Hrs As Needed 8)  Vitamin D 1000 Unit Caps (Cholecalciferol) .Marland Kitchen.. 1 By Mouth Once Daily 9)  Crestor 20 Mg Tabs (Rosuvastatin Calcium) .... As Directed 10)  Fish Oil 1200 Mg Caps (Omega-3 Fatty Acids) .Marland Kitchen.. 1 By Mouth Once Daily 11)  Triamcinolone Acetonide 0.1 % Crea (Triamcinolone Acetonide) .... Use Asd Two Times A Day As Needed 12)  Fluticasone Propionate 50 Mcg/act Susp (Fluticasone Propionate) .... 2 Spray/side Once  Daily  Current Medications (verified): 1)  Qvar 40 Mcg/act Aers (Beclomethasone Dipropionate) .... 2 Puffs At Bedtime 2)  Centrum Silver Ultra Mens  Tabs (Multiple Vitamins-Minerals) .Marland Kitchen.. 1 By Mouth Once Daily 3)  Tamsulosin Hcl 0.4 Mg Caps (Tamsulosin Hcl) .... Take 1 Capsule By Mouth Two Times A Day 4)  Prevacid Solutab 30 Mg Tbdp (Lansoprazole) .... Take 1 Tablet 30 Minutes Before Breakfast Daily As Needed 5)  Dulcolax 5 Mg  Tbec (Bisacodyl) .... As Needed 6)  Diovan 160 Mg Tabs (Valsartan) .Marland Kitchen.. 1po Once Daily 7)   Proair Hfa 108 (90 Base) Mcg/act Aers (Albuterol Sulfate) .... 2 Puffs Q 6 Hrs As Needed 8)  Vitamin D 1000 Unit Caps (Cholecalciferol) .Marland Kitchen.. 1 By Mouth Once Daily 9)  Crestor 20 Mg Tabs (Rosuvastatin Calcium) .... As Directed 10)  Fish Oil 1200 Mg Caps (Omega-3 Fatty Acids) .Marland Kitchen.. 1 By Mouth Once Daily 11)  Triamcinolone Acetonide 0.1 % Crea (Triamcinolone Acetonide) .... Use Asd Two Times A Day As Needed 12)  Fluticasone Propionate 50 Mcg/act Susp (Fluticasone Propionate) .... 2 Spray/side Once Daily 13)  Azithromycin 250 Mg Tabs (Azithromycin) .... 2po Qd For 1 Day, Then 1po Qd For 4days, Then Stop 14)  Prednisone 10 Mg Tabs (Prednisone) .... 3po Qd For 3days, Then 2po Qd For 3days, Then 1po Qd For 3days, Then Stop  Allergies (verified): 1)  Cardura 2)  Lipitor (Atorvastatin)  Past History:  Past Medical History: Last updated: 10/07/2009 Hypertension Hyperlipidemia chronic constipation  -  Dr Dennie Fetters GERD schatzki's ring Allergic rhinitis Benign prostatic hypertrophy eczema E.D. Nephrolithiasis, hx of Wheezing/reactive airways disease -   Dr Mia Creek  Past Surgical History: Last updated: 03/23/2007 Tonsillectomy  Social History: Last updated: 10/07/2009 Former Smoker Alcohol use-no Married 2 children retired  Ambulance person lab for 20 yrs, later Herbalist for 17 yrs Drug use-no  Risk Factors: Smoking Status: quit (01/29/2009)  Review of Systems       all otherwise negative per pt -    Physical Exam  General:  alert and overweight-appearing.  , mild ill  Head:  normocephalic and atraumatic.   Eyes:  vision grossly intact, pupils equal, and pupils round.   Ears:  bilat tm's midl red, sinus nontender Nose:  nasal dischargemucosal pallor and mucosal edema.   Mouth:  pharyngeal erythema and fair dentition.   Neck:  supple and no masses.   Lungs:  normal respiratory effort, R decreased breath sounds, R wheezes, L decreased breath sounds, and L wheezes.   Heart:   normal rate and regular rhythm.   Extremities:  .no edema, no erythema    Impression & Recommendations:  Problem # 1:  BRONCHITIS-ACUTE (ICD-466.0)  His updated medication list for this problem includes:    Qvar 40 Mcg/act Aers (Beclomethasone dipropionate) .Marland Kitchen... 2 puffs at bedtime    Proair Hfa 108 (90 Base) Mcg/act Aers (Albuterol sulfate) .Marland Kitchen... 2 puffs q 6 hrs as needed    Azithromycin 250 Mg Tabs (Azithromycin) .Marland Kitchen... 2po qd for 1 day, then 1po qd for 4days, then stop treat as above, f/u any worsening signs or symptoms   Problem # 2:  WHEEZING (ICD-786.07)  liekly related to above, and reactive airways dz;  for depomedrol IM , and prepack for home  Orders: Depo- Medrol 40mg  (J1030) Depo- Medrol 80mg  (J1040) Admin of Therapeutic Inj  intramuscular or subcutaneous [id number]  Problem # 3:  BENIGN PROSTATIC HYPERTROPHY (ICD-600.00)  His updated medication list for this problem includes:    Tamsulosin  Hcl 0.4 Mg Caps (Tamsulosin hcl) .Marland Kitchen... Take 1 capsule by mouth two times a day with increased urinary slowing chronically - to incr the flomax as above, I suggested urology referral soon but he declines  Problem # 4:  HYPERTENSION (ICD-401.9)  His updated medication list for this problem includes:    Diovan 160 Mg Tabs (Valsartan) .Marland Kitchen... 1po once daily  BP today: 92/58 Prior BP: 152/84 (01/13/2010)  Labs Reviewed: K+: 4.7 (06/17/2008) Creat: : 1.1 (06/17/2008)   Chol: 167 (04/20/2009)   HDL: 49.40 (04/20/2009)   LDL: 96 (04/20/2009)   TG: 110.0 (04/20/2009) low normal today, asympt, ok to cont med as is, follow closetly for any dizzines, fatigue  Complete Medication List: 1)  Qvar 40 Mcg/act Aers (Beclomethasone dipropionate) .... 2 puffs at bedtime 2)  Centrum Silver Ultra Mens Tabs (Multiple vitamins-minerals) .Marland Kitchen.. 1 by mouth once daily 3)  Tamsulosin Hcl 0.4 Mg Caps (Tamsulosin hcl) .... Take 1 capsule by mouth two times a day 4)  Prevacid Solutab 30 Mg Tbdp (Lansoprazole)  .... Take 1 tablet 30 minutes before breakfast daily as needed 5)  Dulcolax 5 Mg Tbec (Bisacodyl) .... As needed 6)  Diovan 160 Mg Tabs (Valsartan) .Marland Kitchen.. 1po once daily 7)  Proair Hfa 108 (90 Base) Mcg/act Aers (Albuterol sulfate) .... 2 puffs q 6 hrs as needed 8)  Vitamin D 1000 Unit Caps (Cholecalciferol) .Marland Kitchen.. 1 by mouth once daily 9)  Crestor 20 Mg Tabs (Rosuvastatin calcium) .... As directed 10)  Fish Oil 1200 Mg Caps (Omega-3 fatty acids) .Marland Kitchen.. 1 by mouth once daily 11)  Triamcinolone Acetonide 0.1 % Crea (Triamcinolone acetonide) .... Use asd two times a day as needed 12)  Fluticasone Propionate 50 Mcg/act Susp (Fluticasone propionate) .... 2 spray/side once daily 13)  Azithromycin 250 Mg Tabs (Azithromycin) .... 2po qd for 1 day, then 1po qd for 4days, then stop 14)  Prednisone 10 Mg Tabs (Prednisone) .... 3po qd for 3days, then 2po qd for 3days, then 1po qd for 3days, then stop  Patient Instructions: 1)  you had the steroid shot today 2)  Please take all new medications as prescribed - the antibiotic, prednisone, and the two times a day generic flomax 3)  Continue all previous medications as before this visit, including the proair and the Qvar  4)  Please schedule a follow-up appointment in 1 month, or sooner if needed Prescriptions: TAMSULOSIN HCL 0.4 MG CAPS (TAMSULOSIN HCL) Take 1 capsule by mouth two times a day  #180 x 3   Entered and Authorized by:   Biagio Borg MD   Signed by:   Biagio Borg MD on 03/18/2010   Method used:   Print then Give to Patient   RxID:   [id number] PREDNISONE 10 MG TABS (PREDNISONE) 3po qd for 3days, then 2po qd for 3days, then 1po qd for 3days, then stop  #18 x 0   Entered and Authorized by:   Biagio Borg MD   Signed by:   Biagio Borg MD on 03/18/2010   Method used:   Print then Give to Patient   RxID:   [id number] AZITHROMYCIN 250 MG TABS (AZITHROMYCIN) 2po qd for 1 day, then 1po qd for 4days, then stop  #6 x 1   Entered and  Authorized by:   Biagio Borg MD   Signed by:   Biagio Borg MD on 03/18/2010   Method used:   Print then Give to Patient   RxID:   [id number]  PROAIR HFA 108 (90 BASE) MCG/ACT AERS (ALBUTEROL SULFATE) 2 puffs q 6 hrs as needed  #1 x 11   Entered and Authorized by:   Biagio Borg MD   Signed by:   Biagio Borg MD on 03/18/2010   Method used:   Print then Give to Patient   RxID:   [id number]    Medication Administration  Injection # 1:    Medication: Depo- Medrol 40mg     Diagnosis: WHEEZING (ICD-786.07)    Route: IM    Site: RUOQ gluteus    Exp Date: 07/2012    Lot #: [id number]    Mfr: [employer]    Comments: Patient received 120mg  Depo-medrol    Patient tolerated injection without complications    Given by: Shirlean Mylar Ewing CMA Deborra Medina) (March 18, 2010 3:40 PM)  Injection # 2:    Medication: Depo- Medrol 80mg     Diagnosis: WHEEZING (ICD-786.07)    Route: IM    Site: RUOQ gluteus    Exp Date: 07/2012    Lot #: [id number]    Mfr: [employer]    Given by: Shirlean Mylar Ewing CMA Deborra Medina) (March 18, 2010 3:40 PM)  Orders Added: 1)  Depo- Medrol 40mg  [J1030] 2)  Depo- Medrol 80mg  [J1040] 3)  Admin of Therapeutic Inj  intramuscular or subcutaneous [96372] 4)  Est. Patient Level IV [id number]

## 2010-05-19 ENCOUNTER — Encounter: Payer: Self-pay | Admitting: Internal Medicine

## 2010-05-19 ENCOUNTER — Ambulatory Visit (INDEPENDENT_AMBULATORY_CARE_PROVIDER_SITE_OTHER): Payer: Medicare Other | Admitting: Internal Medicine

## 2010-05-19 DIAGNOSIS — I10 Essential (primary) hypertension: Secondary | ICD-10-CM

## 2010-05-19 DIAGNOSIS — R062 Wheezing: Secondary | ICD-10-CM

## 2010-05-19 DIAGNOSIS — G3184 Mild cognitive impairment, so stated: Secondary | ICD-10-CM

## 2010-05-19 DIAGNOSIS — J209 Acute bronchitis, unspecified: Secondary | ICD-10-CM

## 2010-05-19 HISTORY — DX: Mild cognitive impairment of uncertain or unknown etiology: G31.84

## 2010-05-20 NOTE — Assessment & Plan Note (Signed)
Summary: 1 MO ROV /NWS  #   Vital Signs:  Patient profile:   77 year old male Height:      72 inches Weight:      206.25 pounds BMI:     28.07 O2 Sat:      95 % on Room air Temp:     98 degrees F oral Pulse rate:   82 / minute BP sitting:   120 / 70  (left arm) Cuff size:   large  Vitals Entered By: Shirlean Mylar Ewing CMA (AAMA) (April 26, 2010 10:45 AM)  O2 Flow:  Room air  CC: 1 month ROV/RE   Primary Care Provider:  Jenny Reichmann  CC:  1 month ROV/RE.  History of Present Illness: here to f/u  - overall doing ok,  Pt denies CP, worsening sob, doe, wheezing, orthopnea, pnd, worsening LE edema, palps, dizziness or syncope Pt denies new neuro symptoms such as headache, facial or extremity weakness  Pt denies polydipsia, polyuria   Overall good compliance with meds, trying to follow low cho  diet, wt stable, little excercise however .  Overall good compliance with meds, and good tolerability.  No fever, wt loss, night sweats, loss of appetite or other constitutional symptoms  Denies worsening depressive symptoms, suicidal ideation, or panic.    For the pasat wk also Did have better then worse again with wheezing, wife mentions she hears the wheezing, but he denies SOB except still c/o proair only lasts 6 hrs .   Has ongoing fatigue, but no daytime somnolence.    Problems Prior to Update: 1)  Frequency, Urinary  (ICD-788.41) 2)  Reactive Airway Disease  (ICD-493.90) 3)  Preventive Health Care  (ICD-V70.0) 4)  Wheezing  (ICD-786.07) 5)  Erectile Dysfunction, Organic  (ICD-607.84) 6)  Rash-nonvesicular  (ICD-782.1) 7)  Cough  (ICD-786.2) 8)  Hepatotoxicity, Drug-induced, Risk of  (ICD-V58.69) 9)  Bursitis, Right Hip  (ICD-726.5) 10)  Fatigue  (ICD-780.79) 11)  Psa, Increased  (ICD-790.93) 12)  Sciatica, Right  (ICD-724.3) 13)  Schatzki's Ring  (ICD-530.3) 14)  Hiatal Hernia  (ICD-553.3) 15)  Abnormal Electrocardiogram  (ICD-794.31) 16)  Preventive Health Care  (ICD-V70.0) 17)  Asthmatic  Bronchitis, Acute  (ICD-466.0) 18)  Bronchitis Not Specified As Acute or Chronic  (ICD-490) 19)  Rash-nonvesicular  (ICD-782.1) 20)  Nephrolithiasis, Hx of  (ICD-V13.01) 21)  Erectile Dysfunction  (ICD-302.72) 22)  Benign Prostatic Hypertrophy  (ICD-600.00) 23)  Allergic Rhinitis  (ICD-477.9) 24)  Gerd  (ICD-530.81) 25)  Hyperlipidemia  (ICD-272.4) 26)  Hypertension  (ICD-401.9)  Medications Prior to Update: 1)  Qvar 40 Mcg/act Aers (Beclomethasone Dipropionate) .... 2 Puffs At Bedtime 2)  Centrum Silver Ultra Mens  Tabs (Multiple Vitamins-Minerals) .Marland Kitchen.. 1 By Mouth Once Daily 3)  Tamsulosin Hcl 0.4 Mg Caps (Tamsulosin Hcl) .... Take 1 Capsule By Mouth Two Times A Day 4)  Prevacid Solutab 30 Mg Tbdp (Lansoprazole) .... Take 1 Tablet 30 Minutes Before Breakfast Daily As Needed 5)  Dulcolax 5 Mg  Tbec (Bisacodyl) .... As Needed 6)  Diovan 160 Mg Tabs (Valsartan) .Marland Kitchen.. 1po Once Daily 7)  Proair Hfa 108 (90 Base) Mcg/act Aers (Albuterol Sulfate) .... 2 Puffs Q 6 Hrs As Needed 8)  Vitamin D 1000 Unit Caps (Cholecalciferol) .Marland Kitchen.. 1 By Mouth Once Daily 9)  Crestor 20 Mg Tabs (Rosuvastatin Calcium) .... As Directed 10)  Fish Oil 1200 Mg Caps (Omega-3 Fatty Acids) .Marland Kitchen.. 1 By Mouth Once Daily 11)  Triamcinolone Acetonide 0.1 % Crea (Triamcinolone Acetonide) .Marland KitchenMarland KitchenMarland Kitchen  Use Asd Two Times A Day As Needed 12)  Fluticasone Propionate 50 Mcg/act Susp (Fluticasone Propionate) .... 2 Spray/side Once Daily 13)  Azithromycin 250 Mg Tabs (Azithromycin) .... 2po Qd For 1 Day, Then 1po Qd For 4days, Then Stop 14)  Prednisone 10 Mg Tabs (Prednisone) .... 3po Qd For 3days, Then 2po Qd For 3days, Then 1po Qd For 3days, Then Stop  Current Medications (verified): 1)  Qvar 40 Mcg/act Aers (Beclomethasone Dipropionate) .... 2 Puffs At Bedtime 2)  Centrum Silver Ultra Mens  Tabs (Multiple Vitamins-Minerals) .Marland Kitchen.. 1 By Mouth Once Daily 3)  Tamsulosin Hcl 0.4 Mg Caps (Tamsulosin Hcl) .... Take 1 Capsule By Mouth Two Times A  Day 4)  Lansoprazole 30 Mg Cpdr (Lansoprazole) .Marland Kitchen.. 1po Once Daily 5)  Dulcolax 5 Mg  Tbec (Bisacodyl) .... As Needed 6)  Diovan 160 Mg Tabs (Valsartan) .Marland Kitchen.. 1po Once Daily 7)  Proair Hfa 108 (90 Base) Mcg/act Aers (Albuterol Sulfate) .... 2 Puffs Q 6 Hrs As Needed 8)  Vitamin D 1000 Unit Caps (Cholecalciferol) .Marland Kitchen.. 1 By Mouth Once Daily 9)  Crestor 20 Mg Tabs (Rosuvastatin Calcium) .Marland Kitchen.. 1 By Mouth Once Daily 10)  Fish Oil 1200 Mg Caps (Omega-3 Fatty Acids) .Marland Kitchen.. 1 By Mouth Once Daily 11)  Triamcinolone Acetonide 0.1 % Crea (Triamcinolone Acetonide) .... Use Asd Two Times A Day As Needed 12)  Fluticasone Propionate 50 Mcg/act Susp (Fluticasone Propionate) .... 2 Spray/side Once Daily 13)  Vesicare 5 Mg Tabs (Solifenacin Succinate) .Marland Kitchen.. 1 By Mouth Once Daily  Allergies (verified): 1)  Cardura 2)  Lipitor (Atorvastatin)  Past History:  Past Medical History: Last updated: 10/07/2009 Hypertension Hyperlipidemia chronic constipation  -  Dr Dennie Fetters GERD schatzki's ring Allergic rhinitis Benign prostatic hypertrophy eczema E.D. Nephrolithiasis, hx of Wheezing/reactive airways disease -   Dr Mia Creek  Past Surgical History: Last updated: 03/23/2007 Tonsillectomy  Social History: Last updated: 10/07/2009 Former Smoker Alcohol use-no Married 2 children retired  Ambulance person lab for 20 yrs, later Herbalist for 17 yrs Drug use-no  Risk Factors: Smoking Status: quit (01/29/2009)  Review of Systems       all otherwise negative per pt -  except for OAB type symtpoms with freq, wihtout urgency, hemautira, fever, abd pain or back pain  Physical Exam  General:  alert and overweight-appearin Head:  normocephalic and atraumatic.   Eyes:  vision grossly intact, pupils equal, and pupils round.   Ears:  R ear normal and L ear normal.   Nose:  no external deformity and no nasal discharge.   Mouth:  no gingival abnormalities and pharynx pink and moist.   Neck:  supple and no  masses.   Lungs:  normal respiratory effort, R decreased breath sounds, R wheezes, L decreased breath sounds, and L wheezes.   Heart:  normal rate and regular rhythm.   Abdomen:  soft, non-tender, and normal bowel sounds.   Msk:  no joint tenderness and no joint swelling.   Extremities:  no edema, no erythema  Neurologic:  strength normal in all extremities and gait normal.   Skin:  color normal and no rashes.   Psych:  not depressed appearing and slightly anxious.     Impression & Recommendations:  Problem # 1:  WHEEZING (ICD-786.07) mild - ? etiology - for cxr to further eval today,  Orders: T-2 View CXR, Same Day (71020.5TC)  Problem # 2:  HYPERTENSION (ICD-401.9)  His updated medication list for this problem includes:    Diovan 160 Mg  Tabs (Valsartan) .Marland Kitchen... 1po once daily  BP today: 120/70 Prior BP: 92/58 (03/18/2010)  Labs Reviewed: K+: 4.7 (06/17/2008) Creat: : 1.1 (06/17/2008)   Chol: 167 (04/20/2009)   HDL: 49.40 (04/20/2009)   LDL: 96 (04/20/2009)   TG: 110.0 (04/20/2009) stable overall by hx and exam, ok to continue meds/tx as is   Problem # 3:  FATIGUE (ICD-780.79) exam benign, to check labs below; follow with expectant management  Orders: TLB-BMP (Basic Metabolic Panel-BMET) (99991111) TLB-CBC Platelet - w/Differential (85025-CBCD) TLB-Hepatic/Liver Function Pnl (80076-HEPATIC) TLB-TSH (Thyroid Stimulating Hormone) (84443-TSH)  Problem # 4:  HYPERLIPIDEMIA (ICD-272.4)  His updated medication list for this problem includes:    Crestor 20 Mg Tabs (Rosuvastatin calcium) .Marland Kitchen... 1 by mouth once daily  Orders: TLB-Lipid Panel (80061-LIPID)  Labs Reviewed: SGOT: 28 (04/20/2009)   SGPT: 21 (04/20/2009)   HDL:49.40 (04/20/2009), 41.90 (09/03/2008)  LDL:96 (04/20/2009), 99 (06/14/2007)  Chol:167 (04/20/2009), 234 (09/03/2008)  Trig:110.0 (04/20/2009), 223.0 (09/03/2008) stable overall by hx and exam, ok to continue meds/tx as is   Problem # 5:  FREQUENCY,  URINARY (ICD-788.41)  His updated medication list for this problem includes:    Tamsulosin Hcl 0.4 Mg Caps (Tamsulosin hcl) .Marland Kitchen... Take 1 capsule by mouth two times a day    Vesicare 5 Mg Tabs (Solifenacin succinate) .Marland Kitchen... 1 by mouth once daily  Orders: TLB-Udip w/ Micro (81001-URINE) c/w prob OAB type symptoms - for urine check today, and vesicare trial   Complete Medication List: 1)  Qvar 40 Mcg/act Aers (Beclomethasone dipropionate) .... 2 puffs at bedtime 2)  Centrum Silver Ultra Mens Tabs (Multiple vitamins-minerals) .Marland Kitchen.. 1 by mouth once daily 3)  Tamsulosin Hcl 0.4 Mg Caps (Tamsulosin hcl) .... Take 1 capsule by mouth two times a day 4)  Lansoprazole 30 Mg Cpdr (Lansoprazole) .Marland Kitchen.. 1po once daily 5)  Dulcolax 5 Mg Tbec (Bisacodyl) .... As needed 6)  Diovan 160 Mg Tabs (Valsartan) .Marland Kitchen.. 1po once daily 7)  Proair Hfa 108 (90 Base) Mcg/act Aers (Albuterol sulfate) .... 2 puffs q 6 hrs as needed 8)  Vitamin D 1000 Unit Caps (Cholecalciferol) .Marland Kitchen.. 1 by mouth once daily 9)  Crestor 20 Mg Tabs (Rosuvastatin calcium) .Marland Kitchen.. 1 by mouth once daily 10)  Fish Oil 1200 Mg Caps (Omega-3 fatty acids) .Marland Kitchen.. 1 by mouth once daily 11)  Triamcinolone Acetonide 0.1 % Crea (Triamcinolone acetonide) .... Use asd two times a day as needed 12)  Fluticasone Propionate 50 Mcg/act Susp (Fluticasone propionate) .... 2 spray/side once daily 13)  Vesicare 5 Mg Tabs (Solifenacin succinate) .Marland Kitchen.. 1 by mouth once daily  Other Orders: TLB-PSA (Prostate Specific Antigen) (84153-PSA)  Patient Instructions: 1)  please return wednesday for CXR 2)  Continue all previous medications as before this visit 3)  Please take all new medications as prescribed  - the vesicare 10 mg  at HALF pill per day 4)  Continue all previous medications as before this visit  5)  Please go to the Lab in the basement for your blood and/or urine tests today 6)  Please call the number on the [facility] for results of your testing  7)  Please  schedule a follow-up appointment in 6 months, or sooner if needed Prescriptions: VESICARE 5 MG TABS (SOLIFENACIN SUCCINATE) 1 by mouth once daily  #30 x 11   Entered and Authorized by:   Biagio Borg MD   Signed by:   Biagio Borg MD on 04/26/2010   Method used:   Print then Give to Patient  [id number] FLUTICASONE PROPIONATE 50 MCG/ACT SUSP (FLUTICASONE PROPIONATE) 2 spray/side once daily  #1 x 11   Entered and Authorized by:   Biagio Borg MD   Signed by:   Biagio Borg MD on 04/26/2010   Method used:   Print then Give to Patient   RxID:   [id number] TRIAMCINOLONE ACETONIDE 0.1 % CREA (TRIAMCINOLONE ACETONIDE) use asd two times a day as needed  #1 x 1   Entered and Authorized by:   Biagio Borg MD   Signed by:   Biagio Borg MD on 04/26/2010   Method used:   Print then Give to Patient   RxID:   [id number] CRESTOR 20 MG TABS (ROSUVASTATIN CALCIUM) 1 by mouth once daily  #30 x 11   Entered and Authorized by:   Biagio Borg MD   Signed by:   Biagio Borg MD on 04/26/2010   Method used:   Print then Give to Patient   RxID:   [id number] DIOVAN 160 MG TABS (VALSARTAN) 1po once daily  #30 x 11   Entered and Authorized by:   Biagio Borg MD   Signed by:   Biagio Borg MD on 04/26/2010   Method used:   Print then Give to Patient   RxID:   [id number] LANSOPRAZOLE 30 MG CPDR (LANSOPRAZOLE) 1po once daily  #30 x 11   Entered and Authorized by:   Biagio Borg MD   Signed by:   Biagio Borg MD on 04/26/2010   Method used:   Print then Give to Patient   [id number] TAMSULOSIN HCL 0.4 MG CAPS (TAMSULOSIN HCL) Take 1 capsule by mouth two times a day  #30 x 11   Entered and Authorized by:   Biagio Borg MD   Signed by:   Biagio Borg MD on 04/26/2010   Method used:   Print then Give to Patient   RxID:   [id number] QVAR 40 MCG/ACT AERS (BECLOMETHASONE DIPROPIONATE) 2 puffs at bedtime  #1 x 11   Entered and Authorized by:   Biagio Borg  MD   Signed by:   Biagio Borg MD on 04/26/2010   Method used:   Print then Give to Patient   RxID:   [id number]    Orders Added: 1)  T-2 View CXR, Same Day [71020.5TC] 2)  TLB-BMP (Basic Metabolic Panel-BMET) 123456 3)  TLB-CBC Platelet - w/Differential [85025-CBCD] 4)  TLB-Hepatic/Liver Function Pnl [80076-HEPATIC] 5)  TLB-TSH (Thyroid Stimulating Hormone) [84443-TSH] 6)  TLB-PSA (Prostate Specific Antigen) [84153-PSA] 7)  TLB-Lipid Panel [80061-LIPID] 8)  TLB-Udip w/ Micro [81001-URINE] 9)  Est. Patient Level IV [id number]

## 2010-05-26 NOTE — Assessment & Plan Note (Signed)
Summary: chest cold   Vital Signs:  Patient profile:   77 year old male Height:      72 inches Weight:      204.13 pounds BMI:     27.79 O2 Sat:      96 % on Room air Temp:     97.5 degrees F oral Pulse rate:   72 / minute BP sitting:   110 / 68  (left arm) Cuff size:   large  Vitals Entered By: Shirlean Mylar Ewing CMA ([place]) (May 19, 2010 3:00 PM)  O2 Flow:  Room air CC: Wheezing, chest cough/RE   Primary Care Provider:  Jenny Reichmann  CC:  Wheezing and chest cough/RE.  History of Present Illness: here to f/u with acute onset 3 days midl to mod fever, St, general weakness and malaise, and now prod cough with greenish sputum and mild wheezing, some worse at night.  Pt denies CP,  orthopnea, pnd, worsening LE edema, palps, dizziness or syncope .  Overall good compliance with meds, and good tolerability, but not taking the qvar since he felt better on the proair alone.  Pt denies new neuro symptoms such as headache, facial or extremity weakness, but has going relatively recent onset memory loss and cognitive slowing - seems quite difficult for him to understand the 2 different inhalers he has and the purpose of each.  Pt denies polydipsia, polyuria, or low sugar symptoms such as shakiness improved with eating.  Overall good compliance with meds, trying to follow low chol, DM diet, wt stable, little excercise however  Recent CXR negative and reviewed with pt, as well as recent labs.  No recent wt loss, night sweats, loss of appetite or other constitutional symptoms  Denies worsening depressive symptoms, suicidal ideation, or panic.    Problems Prior to Update: 1)  Bronchitis-acute  (ICD-466.0) 2)  Frequency, Urinary  (ICD-788.41) 3)  Reactive Airway Disease  (ICD-493.90) 4)  Preventive Health Care  (ICD-V70.0) 5)  Wheezing  (ICD-786.07) 6)  Erectile Dysfunction, Organic  (ICD-607.84) 7)  Rash-nonvesicular  (ICD-782.1) 8)  Cough  (ICD-786.2) 9)  Hepatotoxicity, Drug-induced, Risk of   (ICD-V58.69) 10)  Bursitis, Right Hip  (ICD-726.5) 11)  Fatigue  (ICD-780.79) 12)  Psa, Increased  (ICD-790.93) 13)  Sciatica, Right  (ICD-724.3) 14)  Schatzki's Ring  (ICD-530.3) 15)  Hiatal Hernia  (ICD-553.3) 16)  Abnormal Electrocardiogram  (ICD-794.31) 17)  Preventive Health Care  (ICD-V70.0) 18)  Asthmatic Bronchitis, Acute  (ICD-466.0) 19)  Bronchitis Not Specified As Acute or Chronic  (ICD-490) 20)  Rash-nonvesicular  (ICD-782.1) 21)  Nephrolithiasis, Hx of  (ICD-V13.01) 22)  Erectile Dysfunction  (ICD-302.72) 23)  Benign Prostatic Hypertrophy  (ICD-600.00) 24)  Allergic Rhinitis  (ICD-477.9) 25)  Gerd  (ICD-530.81) 26)  Hyperlipidemia  (ICD-272.4) 27)  Hypertension  (ICD-401.9)  Medications Prior to Update: 1)  Qvar 40 Mcg/act Aers (Beclomethasone Dipropionate) .... 2 Puffs At Bedtime 2)  Centrum Silver Ultra Mens  Tabs (Multiple Vitamins-Minerals) .Marland Kitchen.. 1 By Mouth Once Daily 3)  Tamsulosin Hcl 0.4 Mg Caps (Tamsulosin Hcl) .... Take 1 Capsule By Mouth Two Times A Day 4)  Lansoprazole 30 Mg Cpdr (Lansoprazole) .Marland Kitchen.. 1po Once Daily 5)  Dulcolax 5 Mg  Tbec (Bisacodyl) .... As Needed 6)  Diovan 160 Mg Tabs (Valsartan) .Marland Kitchen.. 1po Once Daily 7)  Proair Hfa 108 (90 Base) Mcg/act Aers (Albuterol Sulfate) .... 2 Puffs Q 6 Hrs As Needed 8)  Vitamin D 1000 Unit Caps (Cholecalciferol) .Marland Kitchen.. 1 By Mouth Once Daily 9)  Crestor 20 Mg Tabs (Rosuvastatin Calcium) .Marland Kitchen.. 1 By Mouth Once Daily 10)  Fish Oil 1200 Mg Caps (Omega-3 Fatty Acids) .Marland Kitchen.. 1 By Mouth Once Daily 11)  Triamcinolone Acetonide 0.1 % Crea (Triamcinolone Acetonide) .... Use Asd Two Times A Day As Needed 12)  Fluticasone Propionate 50 Mcg/act Susp (Fluticasone Propionate) .... 2 Spray/side Once Daily 13)  Vesicare 5 Mg Tabs (Solifenacin Succinate) .Marland Kitchen.. 1 By Mouth Once Daily  Current Medications (verified): 1)  Qvar 80 Mcg/act Aers (Beclomethasone Dipropionate) .... 2 Puffs At Bedtime (And Rinse Mouth After) 2)  Centrum Silver  Ultra Mens  Tabs (Multiple Vitamins-Minerals) .Marland Kitchen.. 1 By Mouth Once Daily 3)  Tamsulosin Hcl 0.4 Mg Caps (Tamsulosin Hcl) .... Take 1 Capsule By Mouth Two Times A Day 4)  Lansoprazole 30 Mg Cpdr (Lansoprazole) .Marland Kitchen.. 1po Once Daily 5)  Dulcolax 5 Mg  Tbec (Bisacodyl) .... As Needed 6)  Diovan 160 Mg Tabs (Valsartan) .Marland Kitchen.. 1po Once Daily 7)  Proair Hfa 108 (90 Base) Mcg/act Aers (Albuterol Sulfate) .... 2 Puffs Q 6 Hrs As Needed 8)  Vitamin D 1000 Unit Caps (Cholecalciferol) .Marland Kitchen.. 1 By Mouth Once Daily 9)  Crestor 20 Mg Tabs (Rosuvastatin Calcium) .Marland Kitchen.. 1 By Mouth Once Daily 10)  Fish Oil 1200 Mg Caps (Omega-3 Fatty Acids) .Marland Kitchen.. 1 By Mouth Once Daily 11)  Triamcinolone Acetonide 0.1 % Crea (Triamcinolone Acetonide) .... Use Asd Two Times A Day As Needed 12)  Fluticasone Propionate 50 Mcg/act Susp (Fluticasone Propionate) .... 2 Spray/side Once Daily 13)  Vesicare 5 Mg Tabs (Solifenacin Succinate) .Marland Kitchen.. 1 By Mouth Once Daily 14)  Azithromycin 250 Mg Tabs (Azithromycin) .... 2po Qd For 1 Day, Then 1po Qd For 4days, Then Stop 15)  Prednisone 10 Mg Tabs (Prednisone) .... 3po Qd For 3days, Then 2po Qd For 3days, Then 1po Qd For 3days, Then Stop  Allergies (verified): 1)  Cardura 2)  Lipitor (Atorvastatin)  Past History:  Past Medical History: Last updated: 10/07/2009 Hypertension Hyperlipidemia chronic constipation  -  Dr Dennie Fetters GERD schatzki's ring Allergic rhinitis Benign prostatic hypertrophy eczema E.D. Nephrolithiasis, hx of Wheezing/reactive airways disease -   Dr Mia Creek  Past Surgical History: Last updated: 03/23/2007 Tonsillectomy  Social History: Last updated: 10/07/2009 Former Smoker Alcohol use-no Married 2 children retired  Ambulance person lab for 20 yrs, later Herbalist for 17 yrs Drug use-no  Risk Factors: Smoking Status: quit (01/29/2009)  Review of Systems       all otherwise negative per pt -    Physical Exam  General:  alert and overweight-appearing,  mild ill  Head:  normocephalic and atraumatic.   Eyes:  vision grossly intact, pupils equal, and pupils round.   Ears:  R ear normal and L ear normal.   Nose:  no external deformity and no nasal discharge.   Mouth:  pharyngeal erythema and fair dentition.   Neck:  supple and no masses.   Lungs:  normal respiratory effort, R decreased breath sounds, R wheezes, L decreased breath sounds, and L wheezes.   Heart:  normal rate and regular rhythm.   Extremities:  no edema, no erythema  Neurologic:  strength normal in all extremities and gait normal.  and mild cognitive slowing noted on history today Skin:  color normal and no rashes.   Psych:  not depressed appearing and slightly anxious.     Impression & Recommendations:  Problem # 1:  BRONCHITIS-ACUTE (ICD-466.0)  His updated medication list for this problem includes:    Qvar 80  Mcg/act Aers (Beclomethasone dipropionate) .Marland Kitchen... 2 puffs at bedtime (and rinse mouth after)    Proair Hfa 108 (90 Base) Mcg/act Aers (Albuterol sulfate) .Marland Kitchen... 2 puffs q 6 hrs as needed    Azithromycin 250 Mg Tabs (Azithromycin) .Marland Kitchen... 2po qd for 1 day, then 1po qd for 4days, then stop treat as above, f/u any worsening signs or symptoms   Take antibiotics and other medications as directed. Encouraged to push clear liquids, get enough rest, and take acetaminophen as needed. To be seen in 5-7 days if no improvement, sooner if worse.  Problem # 2:  WHEEZING (ICD-786.07)  acute on chronic likely related to above, not actually taking the qvar;  short term memory dysfunctiona and understanding the purpose of the meds is likely a related issue;  to start the qvar, use proair for rescue use only; and for the short term he is given depomedrol IM today, and predpack for home, with close f/u due to memory dysfunction  Orders: Depo- Medrol 40mg  (J1030) Depo- Medrol 80mg  (J1040) Admin of Therapeutic Inj  intramuscular or subcutaneous [id number])  Problem # 3:  HYPERTENSION  (ICD-401.9)  His updated medication list for this problem includes:    Diovan 160 Mg Tabs (Valsartan) .Marland Kitchen... 1po once daily  BP today: 110/68 Prior BP: 120/70 (04/26/2010)  Labs Reviewed: K+: 4.9 (04/26/2010) Creat: : 1.2 (04/26/2010)   Chol: 177 (04/26/2010)   HDL: 53.90 (04/26/2010)   LDL: 96 (04/20/2009)   TG: 237.0 (04/26/2010) stable overall by hx and exam, ok to continue meds/tx as is   Problem # 4:  MILD COGNITIVE IMPAIRMENT SO STATED (ICD-331.83) d/w pt - declines further w/u at this time such as MRI or neuro evaluation  Complete Medication List: 1)  Qvar 80 Mcg/act Aers (Beclomethasone dipropionate) .... 2 puffs at bedtime (and rinse mouth after) 2)  Centrum Silver Ultra Mens Tabs (Multiple vitamins-minerals) .Marland Kitchen.. 1 by mouth once daily 3)  Tamsulosin Hcl 0.4 Mg Caps (Tamsulosin hcl) .... Take 1 capsule by mouth two times a day 4)  Lansoprazole 30 Mg Cpdr (Lansoprazole) .Marland Kitchen.. 1po once daily 5)  Dulcolax 5 Mg Tbec (Bisacodyl) .... As needed 6)  Diovan 160 Mg Tabs (Valsartan) .Marland Kitchen.. 1po once daily 7)  Proair Hfa 108 (90 Base) Mcg/act Aers (Albuterol sulfate) .... 2 puffs q 6 hrs as needed 8)  Vitamin D 1000 Unit Caps (Cholecalciferol) .Marland Kitchen.. 1 by mouth once daily 9)  Crestor 20 Mg Tabs (Rosuvastatin calcium) .Marland Kitchen.. 1 by mouth once daily 10)  Fish Oil 1200 Mg Caps (Omega-3 fatty acids) .Marland Kitchen.. 1 by mouth once daily 11)  Triamcinolone Acetonide 0.1 % Crea (Triamcinolone acetonide) .... Use asd two times a day as needed 12)  Fluticasone Propionate 50 Mcg/act Susp (Fluticasone propionate) .... 2 spray/side once daily 13)  Vesicare 5 Mg Tabs (Solifenacin succinate) .Marland Kitchen.. 1 by mouth once daily 14)  Azithromycin 250 Mg Tabs (Azithromycin) .... 2po qd for 1 day, then 1po qd for 4days, then stop 15)  Prednisone 10 Mg Tabs (Prednisone) .... 3po qd for 3days, then 2po qd for 3days, then 1po qd for 3days, then stop  Patient Instructions: 1)  You had the steroid shot today 2)  Please take all new  medications as prescribed - the antibiotic (azithromycin), the prednisone (pill steroid for wheezing) and the Qvar 80  (the main medication for the wheezing) 3)  Remember the Qvar is to be done every day, and the Proair is ONLY to be used if you need an extra "boost" to  help the wheezing during the day 4)  Continue all other previous medications as before this visit  5)  Please schedule a follow-up appointment in 2 weeks. 6)  Your medication prescriptions (Three of them) were sent to the pharmacy Prescriptions: QVAR 80 MCG/ACT AERS (BECLOMETHASONE DIPROPIONATE) 2 puffs at bedtime (and rinse mouth after)  #1 x 11   Entered and Authorized by:   Biagio Borg MD   Signed by:   Biagio Borg MD on 05/19/2010   Method used:   Electronically to        [facility]. # [id number]* (retail)       [address]       [place], [place]  [address]       Ph: [phone] or [phone]       Fax: [phone]   RxID:   [id number] PREDNISONE 10 MG TABS (PREDNISONE) 3po qd for 3days, then 2po qd for 3days, then 1po qd for 3days, then stop  #18 x 0   Entered and Authorized by:   Biagio Borg MD   Signed by:   Biagio Borg MD on 05/19/2010   Method used:   Electronically to        [facility]. # [id number]* (retail)       [address]       [place], [place]  [address]       Ph: [phone] or [phone]       Fax: [phone]   RxID:   [id number] AZITHROMYCIN 250 MG TABS (AZITHROMYCIN) 2po qd for 1 day, then 1po qd for 4days, then stop  #6 x 1   Entered and Authorized by:   Biagio Borg MD   Signed by:   Biagio Borg MD on 05/19/2010   Method used:   Electronically to        [facility]. # [id number]* (retail)       [address]       [place], [place]  [address]       Ph: [phone] or [phone]       Fax: [phone]   RxID:   [id number]    Medication Administration  Injection # 1:    Medication:  Depo- Medrol 40mg     Diagnosis: WHEEZING (ICD-786.07)    Route: IM    Site: RUOQ gluteus    Exp Date: 10/2012    Lot #: [id number]    Mfr: Pharmacia    Comments: Patient received 120mg  Depo-Medrol    Patient tolerated injection without complications    Given by: Shirlean Mylar Ewing CMA [facility]) (May 19, 2010 3:35 PM)  Injection # 2:    Medication: Depo- Medrol 80mg     Diagnosis: WHEEZING (ICD-786.07)    Route: IM    Site: RUOQ gluteus    Exp Date: 10/2012    Lot #: [id number]    Mfr: [employer]acia    Given by: Shirlean Mylar Ewing CMA Deborra Medina) (May 19, 2010 3:35 PM)  Orders Added: 1)  Depo- Medrol 40mg  [J1030] 2)  Depo- Medrol 80mg  [J1040] 3)  Admin of Therapeutic Inj  intramuscular or subcutaneous [[id number]] 4)  Est. Patient Level IV [id number]

## 2010-06-02 ENCOUNTER — Encounter: Payer: Self-pay | Admitting: Internal Medicine

## 2010-06-02 ENCOUNTER — Ambulatory Visit: Payer: Medicare Other | Admitting: Internal Medicine

## 2010-06-02 DIAGNOSIS — I10 Essential (primary) hypertension: Secondary | ICD-10-CM

## 2010-06-02 DIAGNOSIS — E785 Hyperlipidemia, unspecified: Secondary | ICD-10-CM

## 2010-06-02 DIAGNOSIS — J209 Acute bronchitis, unspecified: Secondary | ICD-10-CM

## 2010-06-02 DIAGNOSIS — J45909 Unspecified asthma, uncomplicated: Secondary | ICD-10-CM

## 2010-06-10 NOTE — Assessment & Plan Note (Signed)
Vital Signs:  Patient profile:   77 year old male Height:      72 inches Weight:      203.25 pounds BMI:     27.67 O2 Sat:      95 % on Room air Temp:     97.8 degrees F oral Pulse rate:   70 / minute BP sitting:   104 / 68  (left arm) Cuff size:   large  Vitals Entered By: Shirlean Mylar Ewing CMA [facility]) (June 02, 2010 1:56 PM)  O2 Flow:  Room air  CC: 2 week followup/RE   Primary Care Provider:  Jenny Reichmann  CC:  2 week followup/RE.  History of Present Illness: here to f/u; overall feels like "a million bucks"  with complete resolution of symptoms from last visit;  Pt denies CP, worsening sob, doe, wheezing, orthopnea, pnd, worsening LE edema, palps, dizziness or syncope  Pt denies new neuro symptoms such as headache, facial or extremity weakness  Pt denies polydipsia, polyuria  Overall good compliance with meds, trying to follow low chol diet, wt stable, little excercise however No fever, wt loss, night sweats, loss of appetite or other constitutional symptoms  Overall good compliance with meds, and good tolerability, except for some reason despite multiple coaching last visit , he still is not using the qvar daily , only "off and on." , has signficant difficulty understanding the difference and purpose of the qvar vs the proair.   Is somewhat worried the bronchitis will recur.    Problems Prior to Update: 1)  Mild Cognitive Impairment So Stated  (ICD-331.83) 2)  Bronchitis-acute  (ICD-466.0) 3)  Frequency, Urinary  (ICD-788.41) 4)  Reactive Airway Disease  (ICD-493.90) 5)  Preventive Health Care  (ICD-V70.0) 6)  Wheezing  (ICD-786.07) 7)  Erectile Dysfunction, Organic  (ICD-607.84) 8)  Rash-nonvesicular  (ICD-782.1) 9)  Cough  (ICD-786.2) 10)  Hepatotoxicity, Drug-induced, Risk of  (ICD-V58.69) 11)  Bursitis, Right Hip  (ICD-726.5) 12)  Fatigue  (ICD-780.79) 13)  Psa, Increased  (ICD-790.93) 14)  Sciatica, Right  (ICD-724.3) 15)  Schatzki's Ring  (ICD-530.3) 16)  Hiatal Hernia   (ICD-553.3) 17)  Abnormal Electrocardiogram  (ICD-794.31) 18)  Preventive Health Care  (ICD-V70.0) 19)  Asthmatic Bronchitis, Acute  (ICD-466.0) 20)  Bronchitis Not Specified As Acute or Chronic  (ICD-490) 21)  Rash-nonvesicular  (ICD-782.1) 22)  Nephrolithiasis, Hx of  (ICD-V13.01) 23)  Erectile Dysfunction  (ICD-302.72) 24)  Benign Prostatic Hypertrophy  (ICD-600.00) 25)  Allergic Rhinitis  (ICD-477.9) 26)  Gerd  (ICD-530.81) 27)  Hyperlipidemia  (ICD-272.4) 28)  Hypertension  (ICD-401.9)  Medications Prior to Update: 1)  Qvar 80 Mcg/act Aers (Beclomethasone Dipropionate) .... 2 Puffs At Bedtime (And Rinse Mouth After) 2)  Centrum Silver Ultra Mens  Tabs (Multiple Vitamins-Minerals) .Marland Kitchen.. 1 By Mouth Once Daily 3)  Tamsulosin Hcl 0.4 Mg Caps (Tamsulosin Hcl) .... Take 1 Capsule By Mouth Two Times A Day 4)  Lansoprazole 30 Mg Cpdr (Lansoprazole) .Marland Kitchen.. 1po Once Daily 5)  Dulcolax 5 Mg  Tbec (Bisacodyl) .... As Needed 6)  Diovan 160 Mg Tabs (Valsartan) .Marland Kitchen.. 1po Once Daily 7)  Proair Hfa 108 (90 Base) Mcg/act Aers (Albuterol Sulfate) .... 2 Puffs Q 6 Hrs As Needed 8)  Vitamin D 1000 Unit Caps (Cholecalciferol) .Marland Kitchen.. 1 By Mouth Once Daily 9)  Crestor 20 Mg Tabs (Rosuvastatin Calcium) .Marland Kitchen.. 1 By Mouth Once Daily 10)  Fish Oil 1200 Mg Caps (Omega-3 Fatty Acids) .Marland Kitchen.. 1 By Mouth Once Daily 11)  Triamcinolone Acetonide  0.1 % Crea (Triamcinolone Acetonide) .... Use Asd Two Times A Day As Needed 12)  Fluticasone Propionate 50 Mcg/act Susp (Fluticasone Propionate) .... 2 Spray/side Once Daily 13)  Vesicare 5 Mg Tabs (Solifenacin Succinate) .Marland Kitchen.. 1 By Mouth Once Daily 14)  Azithromycin 250 Mg Tabs (Azithromycin) .... 2po Qd For 1 Day, Then 1po Qd For 4days, Then Stop 15)  Prednisone 10 Mg Tabs (Prednisone) .... 3po Qd For 3days, Then 2po Qd For 3days, Then 1po Qd For 3days, Then Stop  Current Medications (verified): 1)  Qvar 80 Mcg/act Aers (Beclomethasone Dipropionate) .... 2 Puffs At Bedtime (And  Rinse Mouth After) 2)  Centrum Silver Ultra Mens  Tabs (Multiple Vitamins-Minerals) .Marland Kitchen.. 1 By Mouth Once Daily 3)  Tamsulosin Hcl 0.4 Mg Caps (Tamsulosin Hcl) .... Take 1 Capsule By Mouth Two Times A Day 4)  Lansoprazole 30 Mg Cpdr (Lansoprazole) .Marland Kitchen.. 1po Once Daily 5)  Dulcolax 5 Mg  Tbec (Bisacodyl) .... As Needed 6)  Diovan 160 Mg Tabs (Valsartan) .Marland Kitchen.. 1po Once Daily 7)  Proair Hfa 108 (90 Base) Mcg/act Aers (Albuterol Sulfate) .... 2 Puffs Q 6 Hrs As Needed 8)  Vitamin D 1000 Unit Caps (Cholecalciferol) .Marland Kitchen.. 1 By Mouth Once Daily 9)  Crestor 20 Mg Tabs (Rosuvastatin Calcium) .Marland Kitchen.. 1 By Mouth Once Daily 10)  Fish Oil 1200 Mg Caps (Omega-3 Fatty Acids) .Marland Kitchen.. 1 By Mouth Once Daily 11)  Triamcinolone Acetonide 0.1 % Crea (Triamcinolone Acetonide) .... Use Asd Two Times A Day As Needed 12)  Fluticasone Propionate 50 Mcg/act Susp (Fluticasone Propionate) .... 2 Spray/side Once Daily 13)  Vesicare 5 Mg Tabs (Solifenacin Succinate) .Marland Kitchen.. 1 By Mouth Once Daily  Allergies (verified): 1)  Cardura 2)  Lipitor (Atorvastatin)  Past History:  Past Medical History: Last updated: 10/07/2009 Hypertension Hyperlipidemia chronic constipation  -  Dr Dennie Fetters GERD schatzki's ring Allergic rhinitis Benign prostatic hypertrophy eczema E.D. Nephrolithiasis, hx of Wheezing/reactive airways disease -   Dr Mia Creek  Past Surgical History: Last updated: 03/23/2007 Tonsillectomy  Social History: Last updated: 10/07/2009 Former Smoker Alcohol use-no Married 2 children retired  Ambulance person lab for 20 yrs, later Herbalist for 17 yrs Drug use-no  Risk Factors: Smoking Status: quit (01/29/2009)  Review of Systems       all otherwise negative per pt -    Physical Exam  General:  alert and overweight-appearing, Head:  normocephalic and atraumatic.   Eyes:  vision grossly intact, pupils equal, and pupils round.   Ears:  R ear normal and L ear normal.   Nose:  no external deformity and no  nasal discharge.   Mouth:  no gingival abnormalities and pharynx pink and moist.   Neck:  supple and no masses.   Lungs:  normal respiratory effort, R decreased breath sounds, and L decreased breath sounds.  but no rales or wheezing Heart:  normal rate and regular rhythm.   Extremities:  no edema, no erythema    Impression & Recommendations:  Problem # 1:  BRONCHITIS-ACUTE (ICD-466.0)  The following medications were removed from the medication list:    Azithromycin 250 Mg Tabs (Azithromycin) .Marland Kitchen... 2po qd for 1 day, then 1po qd for 4days, then stop His updated medication list for this problem includes:    Qvar 80 Mcg/act Aers (Beclomethasone dipropionate) .Marland Kitchen... 2 puffs at bedtime (and rinse mouth after)    Proair Hfa 108 (90 Base) Mcg/act Aers (Albuterol sulfate) .Marland Kitchen... 2 puffs q 6 hrs as needed resolved;  pt reassured  Take antibiotics  and other medications as directed. Encouraged to push clear liquids, get enough rest, and take acetaminophen as needed. To be seen in 5-7 days if no improvement, sooner if worse.  Problem # 2:  REACTIVE AIRWAY DISEASE (ICD-493.90)  The following medications were removed from the medication list:    Prednisone 10 Mg Tabs (Prednisone) .Marland Kitchen... 3po qd for 3days, then 2po qd for 3days, then 1po qd for 3days, then stop His updated medication list for this problem includes:    Qvar 80 Mcg/act Aers (Beclomethasone dipropionate) .Marland Kitchen... 2 puffs at bedtime (and rinse mouth after)    Proair Hfa 108 (90 Base) Mcg/act Aers (Albuterol sulfate) .Marland Kitchen... 2 puffs q 6 hrs as needed  Pulmonary Functions Reviewed: O2 sat: 95 (06/02/2010) I spent some time educating pt again, but appears he still has signficiant problem with understainding his meds and I asked him to get his wife to help as well.  Problem # 3:  HYPERTENSION (ICD-401.9)  His updated medication list for this problem includes:    Diovan 160 Mg Tabs (Valsartan) .Marland Kitchen... 1po once daily  BP today: 104/68 Prior BP:  110/68 (05/19/2010)  Labs Reviewed: K+: 4.9 (04/26/2010) Creat: : 1.2 (04/26/2010)   Chol: 177 (04/26/2010)   HDL: 53.90 (04/26/2010)   LDL: 96 (04/20/2009)   TG: 237.0 (04/26/2010) stable overall by hx and exam, ok to continue meds/tx as is   Problem # 4:  HYPERLIPIDEMIA (ICD-272.4)  His updated medication list for this problem includes:    Crestor 20 Mg Tabs (Rosuvastatin calcium) .Marland Kitchen... 1 by mouth once daily  Labs Reviewed: SGOT: 20 (04/26/2010)   SGPT: 16 (04/26/2010)   HDL:53.90 (04/26/2010), 49.40 (04/20/2009)  LDL:96 (04/20/2009), 99 (06/14/2007)  Chol:177 (04/26/2010), 167 (04/20/2009)  Trig:237.0 (04/26/2010), 110.0 (04/20/2009)stable overall by hx and exam, ok to continue meds/tx as is   Complete Medication List: 1)  Qvar 80 Mcg/act Aers (Beclomethasone dipropionate) .... 2 puffs at bedtime (and rinse mouth after) 2)  Centrum Silver Ultra Mens Tabs (Multiple vitamins-minerals) .Marland Kitchen.. 1 by mouth once daily 3)  Tamsulosin Hcl 0.4 Mg Caps (Tamsulosin hcl) .... Take 1 capsule by mouth two times a day 4)  Lansoprazole 30 Mg Cpdr (Lansoprazole) .Marland Kitchen.. 1po once daily 5)  Dulcolax 5 Mg Tbec (Bisacodyl) .... As needed 6)  Diovan 160 Mg Tabs (Valsartan) .Marland Kitchen.. 1po once daily 7)  Proair Hfa 108 (90 Base) Mcg/act Aers (Albuterol sulfate) .... 2 puffs q 6 hrs as needed 8)  Vitamin D 1000 Unit Caps (Cholecalciferol) .Marland Kitchen.. 1 by mouth once daily 9)  Crestor 20 Mg Tabs (Rosuvastatin calcium) .Marland Kitchen.. 1 by mouth once daily 10)  Fish Oil 1200 Mg Caps (Omega-3 fatty acids) .Marland Kitchen.. 1 by mouth once daily 11)  Triamcinolone Acetonide 0.1 % Crea (Triamcinolone acetonide) .... Use asd two times a day as needed 12)  Fluticasone Propionate 50 Mcg/act Susp (Fluticasone propionate) .... 2 spray/side once daily 13)  Vesicare 5 Mg Tabs (Solifenacin succinate) .Marland Kitchen.. 1 by mouth once daily  Patient Instructions: 1)  Continue all previous medications as before this visit 2)  Remember the QVAR is every day, and the Proair is  for the "extra boost" for breathing only if you need it 3)  You are given the vesicare samples and the prescriptioin 4)  Please schedule a follow-up appointment in 6 months or sooner if needed Prescriptions: VESICARE 5 MG TABS (SOLIFENACIN SUCCINATE) 1 by mouth once daily  #30 x 11   Entered and Authorized by:   Biagio Borg MD  Signed by:   Biagio Borg MD on 06/02/2010   Method used:   Print then Give to Patient   RxID:   [id number]    Orders Added: 1)  Est. Patient Level IV [id number]

## 2010-08-17 NOTE — Assessment & Plan Note (Signed)
[facility]                         GASTROENTEROLOGY OFFICE NOTE   DAWOOD, HALLMON                         MRN:          [id number]  DATE:10/27/2006                            DOB:          05/17/1927    Mr. Stephen Wilkins is a very nice 77 year old gentleman whom we saw in the past  for functional constipation.  He had a colonoscopy in August 2006.  He  also has a history of gastroesophageal reflux and he is here today  because of increasing reflux symptoms at night.  He eats supper at 6  o'clock, goes to bed around 10 o'clock.  He wakes up at 2 in the morning  with substernal discomfort as well as with hiccups.  He denies any  dysphagia to solids or liquids, or nocturnal cough.  He was evaluated  for gastroesophageal reflux disease by Dr. Earlean Shawl, December 1995 and was  found to have a hiatal hernia and Schatzki's ring, he did not require  dilatation at that time.  Patient was put on Prilosec 20 mg daily and  stayed on it for a short period of time.  In the last year or two he has  had increasing reflux symptoms for which he has taken Prevacid SoluTabs  30 mg on p.r.n. basis.   MEDICATIONS:  1. Multiple vitamins.  2. Benicar 20 mg p.o. daily.  3. Flomax 0.4 mg daily.  4. Crestor 40 mg half tablet daily.  5. Ocuvite.  6. Aspirin 81 mg daily.  7. Vitamin E.  8. He also takes BC powders.   PHYSICAL EXAMINATION:  Blood pressure 130/80, pulse 80 and weight 205  pounds.  He was alert, oriented and in no distress.  His voice was  normal.  NECK:  Supple, no adenopathy.  LUNGS:  Clear to auscultation.  COR:  With normal S1 and normal S2.  ABDOMEN:  Soft with minimal discomfort in subxyphoid area.  There was no  tenderness in the left or right lower quadrant.  Bowel sounds were  normoactive.  Liver edge at costal margin.  RECTAL:  Exam not done.  EXTREMITIES:  No edema.   IMPRESSION:  A 77 year old white male with chronic gastroesophageal  reflux disease  and a hiatal hernia documented on upper endoscopy 13  years ago.  He has progressive symptoms of reflux and possibly chronic  esophagitis.  His intermittent hiccups most likely related to  diaphragmatic irritation of a phrenic nerve by acid reflux.  He does not  show any clear symptoms of nocturnal aspiration . He needs to be  evaluated for possibly Barrett's esophagus.   PLAN:  1. I have explained to patient the reason behind his symptoms and gave      him booklet on gastroesophageal reflux  and es. strictur.  He will      follow strict anti-reflux measures starting now.  2. Take Prevacid on daily basis rather than on p.r.n. basis, 30 mg at      bedtime.  3. Upper endoscopy with appropriate biopsies has been scheduled.  4. Avoid use of excessive aspirin and nonsteroidal agents.  During his      upper endoscopy, will also decide if esophageal dilation is      indicated.  5. MiraLax 17 grams per cap, dispensed 255 grams, take on p.r.n.      basis, transmitted by ePrescribing to __________ .     Lowella Bandy. Olevia Perches, MD  Electronically Signed    DMB/MedQ  DD: 10/27/2006  DT: 10/27/2006  Job #: [id number]   cc:   Nelda Severe. Juventino Slovak, M.D.

## 2010-08-20 NOTE — Op Note (Signed)
NAMESTANDFORD, SNOWBALL                  ACCOUNT NO.:  [id number]   MEDICAL RECORD NO.:  [id number]          PATIENT TYPE:  AMB   LOCATION:  [facility]                         FACILITY:  [facility]   PHYSICIAN:  Synthia Innocent, M.D.  DATE OF BIRTH:  February 19, 1928   DATE OF PROCEDURE:  03/16/2006  DATE OF DISCHARGE:                               OPERATIVE REPORT   PREOPERATIVE DIAGNOSIS:  Phimosis.   POSTOPERATIVE DIAGNOSIS:  Phimosis.   OPERATION:  Circumcision.   SURGEON:  Dr. Joelyn Oms.   HISTORY:  This is a 77 year old man who presented to our office several  weeks ago with severe paraphimosis with purulent drainage.  We were able  to reduce the paraphimosis in the office, treated him with antibiotics.  He came back a few days later feeling much better.  We then proposed a  circumcision initially which he declined.  However, he presented to our  office a few days ago with a severe phimosis.  I was unable to retract  the foreskin much beyond the tip of the glans.  The patient is now ready  for a circumcision.  He did take Keflex preoperatively along with some  Mycolog cream.  I explained the procedure to him and his wife including  complications such as postop bleeding, the hematoma, possible infection.  They understand and agree to the proposed surgery.   PROCEDURE:  The patient was prepped and draped under LMA anesthesia with  Betadine after identifying the patient.  He was noted to have a severe  phimosis, and eventually I was able to reduce the foreskin back over the  corona.  He had a tight band just below the corona with some thickening  of the skin.  We then, using a marking pen, outlined incisions on the  skin and the mucosal surfaces.  I left about 3-4 mm of mucosa just below  the corona.  Then using the knife, we outlined the incisions on the skin  and the mucosal surfaces and then gradually dissected the skin from the  dartos over the head of the penis and then removed the skin.  All  bleeders were electrocoagulated.  I then injected approximately 6 mL of  1% Xylocaine, 0.25% plain Marcaine solution for a penile block.  During  the course of the reduction, there was a slight mucosal tear right over  the frenular area which we repaired with a 5-0 chromic in a vertical  fashion.  I then attached the shaft skin to the mucosa with interrupted  3-0 chromic stitches.  We then applied Xeroform gauze along with a 4x4  with Kling dressing and then the Coban.  He was then taken back to the  recovery room in satisfactory condition.      Synthia Innocent, M.D.  Electronically Signed     RFS/MEDQ  D:  03/16/2006  T:  03/16/2006  Job:  [id number]

## 2010-09-01 ENCOUNTER — Ambulatory Visit (INDEPENDENT_AMBULATORY_CARE_PROVIDER_SITE_OTHER): Payer: Medicare Other | Admitting: Internal Medicine

## 2010-09-01 ENCOUNTER — Encounter: Payer: Self-pay | Admitting: Internal Medicine

## 2010-09-01 VITALS — BP 112/62 | HR 77 | Temp 97.9°F | Ht 72.0 in | Wt 205.1 lb

## 2010-09-01 DIAGNOSIS — N400 Enlarged prostate without lower urinary tract symptoms: Secondary | ICD-10-CM

## 2010-09-01 DIAGNOSIS — J309 Allergic rhinitis, unspecified: Secondary | ICD-10-CM

## 2010-09-01 DIAGNOSIS — F0390 Unspecified dementia without behavioral disturbance: Secondary | ICD-10-CM | POA: Insufficient documentation

## 2010-09-01 DIAGNOSIS — R5381 Other malaise: Secondary | ICD-10-CM

## 2010-09-01 DIAGNOSIS — I10 Essential (primary) hypertension: Secondary | ICD-10-CM

## 2010-09-01 HISTORY — DX: Unspecified dementia, unspecified severity, without behavioral disturbance, psychotic disturbance, mood disturbance, and anxiety: F03.90

## 2010-09-01 MED ORDER — VALSARTAN 80 MG PO TABS: 80.0000 mg | ORAL_TABLET | Freq: Every day | ORAL | Status: DC

## 2010-09-01 NOTE — Assessment & Plan Note (Signed)
?   overcontrolled - to decrease the diovan to 80 mg as pt seems to have adequate control on half 160mg  and really wants to do this;  I have ? Convinced him that he really does not need to change to dilt 120 as he wants to , to avoid further bruising to the arms  BP Readings from Last 3 Encounters:  09/01/10 112/62  06/02/10 104/68  05/19/10 110/68

## 2010-09-01 NOTE — Assessment & Plan Note (Signed)
stable overall by hx and exam, most recent data reviewed with pt, and pt to continue medical treatment as before., no longer thinks he needs the flonase,  to f/u any worsening symptoms or concerns

## 2010-09-01 NOTE — Progress Notes (Signed)
  Subjective:    Patient ID: Stephen Wilkins, male    DOB: [birth year], 77 y.o.   MRN: [id number]  HPI  Pt wants to change diovan to diltiazem since he has had some spont bruising to the arms lately.  Only taking Half of the 160 at this time - ? Due to dizziness but he is vague about this.  Pt denies chest pain, increased sob or doe, wheezing, orthopnea, PND, increased LE swelling, palpitations, or syncope.  Pt denies new neurological symptoms such as new headache, or facial or extremity weakness or numbness   Pt denies polydipsia, polyuria.  Pt states overall good compliance with meds, trying to follow lower cholesterol diet, wt overall stable but little exercise however.   Dementia overall stable symptomatically with gradual worsening at best, and not assoc with behavioral changes such as hallucinations, paranoia, or agitation, but is not wanting any tx or further evaluation at this time.  Does have several wks ongoing nasal allergy symptoms with clear congestion, itch and sneeze, without fever, pain, ST, cough or wheezing, but mild and does not want tx.  Also already taking flomax bid, still with some decreased urinary flow,  But does not want urology referral Past Medical History  Diagnosis Date  . ABNORMAL ELECTROCARDIOGRAM 06/21/2007  . ALLERGIC RHINITIS 03/23/2007  . ASTHMATIC BRONCHITIS, ACUTE 04/27/2007  . BENIGN PROSTATIC HYPERTROPHY 03/23/2007  . BRONCHITIS NOT SPECIFIED AS ACUTE OR CHRONIC 04/21/2007  . BURSITIS, RIGHT HIP 07/31/2008  . Cough 01/29/2009  . ERECTILE DYSFUNCTION, ORGANIC 04/17/2009  . ERECTILE DYSFUNCTION 03/23/2007  . FREQUENCY, URINARY 04/26/2010  . GERD 03/23/2007  . HIATAL HERNIA 07/18/2007  . HYPERLIPIDEMIA 03/23/2007  . HYPERTENSION 03/23/2007  . MILD COGNITIVE IMPAIRMENT SO STATED 05/19/2010  . NEPHROLITHIASIS, HX OF 03/23/2007  . PSA, INCREASED 06/27/2008  . RASH-NONVESICULAR 03/23/2007  . REACTIVE AIRWAY DISEASE 01/14/2010  . SCHATZKI'S RING 07/18/2007  . SCIATICA,  RIGHT 04/28/2008  . Wheezing 04/17/2009  . Dementia 09/01/2010   Past Surgical History  Procedure Date  . Tonsillectomy     reports that he has quit smoking. He does not have any smokeless tobacco history on file. He reports that he does not drink alcohol or use illicit drugs. family history includes COPD in his other; Cancer in his brother and other; and Hypertension in his other. Allergies  Allergen Reactions  . Atorvastatin     REACTION: myalgia  . Doxazosin Mesylate     REACTION: dizziness   No current outpatient prescriptions on file prior to visit.    Review of Systems All otherwise neg per pt     Objective:   Physical Exam BP 112/62  Pulse 77  Temp(Src) 97.9 F (36.6 C) (Oral)  Ht 6' (1.829 m)  Wt 205 lb 2 oz (93.044 kg)  BMI 27.82 kg/m2  SpO2 94% Physical Exam  VS noted Constitutional: Pt appears well-developed and well-nourished.  HENT: Head: Normocephalic.  Right Ear: External ear normal.  Left Ear: External ear normal.  Eyes: Conjunctivae and EOM are normal. Pupils are equal, round, and reactive to light.  Neck: Normal range of motion. Neck supple.  Cardiovascular: Normal rate and regular rhythm.   Pulmonary/Chest: Effort normal and breath sounds normal.  Neurological: Pt is alert. No cranial nerve deficit.  Skin: Skin is warm. No erythema.  Psychiatric: Pt behavior is normal. Thought content c/w mild cognitive dysfunction/memory loss       Assessment & Plan:

## 2010-09-01 NOTE — Assessment & Plan Note (Signed)
stable overall by hx and exam, most recent data reviewed with pt, and pt to continue medical treatment as before  Lab Results  Component Value Date   WBC 8.3 04/26/2010   HGB 14.8 04/26/2010   HCT 42.7 04/26/2010   PLT 242.0 04/26/2010   CHOL 177 04/26/2010   TRIG 237.0* 04/26/2010   HDL 53.90 04/26/2010   LDLDIRECT 103.2 04/26/2010   ALT 16 04/26/2010   AST 20 04/26/2010   NA 138 04/26/2010   K 4.9 04/26/2010   CL 100 04/26/2010   CREATININE 1.2 04/26/2010   BUN 25* 04/26/2010   CO2 32 04/26/2010   TSH 5.22 04/26/2010   PSA 4.59* 04/26/2010

## 2010-09-01 NOTE — Assessment & Plan Note (Signed)
?   Mild worsening, pt not willing for further eval or tx at this time,  to f/u any worsening symptoms or concerns

## 2010-09-01 NOTE — Patient Instructions (Addendum)
Please stop the diovan 160 mg Please start the diovan 80 mg as we discussed Continue all other medications as before Please return Aug 29 as you already have planned

## 2010-12-01 ENCOUNTER — Ambulatory Visit (INDEPENDENT_AMBULATORY_CARE_PROVIDER_SITE_OTHER): Payer: Medicare Other | Admitting: Internal Medicine

## 2010-12-01 ENCOUNTER — Other Ambulatory Visit: Payer: Self-pay | Admitting: Internal Medicine

## 2010-12-01 ENCOUNTER — Encounter: Payer: Self-pay | Admitting: Internal Medicine

## 2010-12-01 ENCOUNTER — Other Ambulatory Visit (INDEPENDENT_AMBULATORY_CARE_PROVIDER_SITE_OTHER): Payer: Medicare Other

## 2010-12-01 VITALS — BP 102/62 | HR 81 | Temp 98.3°F | Ht 72.0 in | Wt 207.1 lb

## 2010-12-01 DIAGNOSIS — J45909 Unspecified asthma, uncomplicated: Secondary | ICD-10-CM

## 2010-12-01 DIAGNOSIS — I10 Essential (primary) hypertension: Secondary | ICD-10-CM

## 2010-12-01 DIAGNOSIS — R972 Elevated prostate specific antigen [PSA]: Secondary | ICD-10-CM

## 2010-12-01 DIAGNOSIS — E785 Hyperlipidemia, unspecified: Secondary | ICD-10-CM

## 2010-12-01 LAB — PSA: PSA: 3.89 ng/mL (ref 0.10–4.00)

## 2010-12-01 LAB — LDL CHOLESTEROL, DIRECT: Direct LDL: 100.6 mg/dL

## 2010-12-01 LAB — LIPID PANEL: VLDL: 48 mg/dL — ABNORMAL HIGH (ref 0.0–40.0)

## 2010-12-01 MED ORDER — BECLOMETHASONE DIPROPIONATE 80 MCG/ACT IN AERS: 2.0000 | INHALATION_SPRAY | Freq: Two times a day (BID) | RESPIRATORY_TRACT | Status: DC

## 2010-12-01 MED ORDER — ALBUTEROL SULFATE HFA 108 (90 BASE) MCG/ACT IN AERS: 2.0000 | INHALATION_SPRAY | Freq: Four times a day (QID) | RESPIRATORY_TRACT | Status: DC | PRN

## 2010-12-01 NOTE — Assessment & Plan Note (Addendum)
stable overall by hx and exam, most recent data reviewed with pt, and pt to continue medical treatment as before  Lab Results  Component Value Date   [place] 96 04/20/2009   To re-check lipids today, admits to only taking the crestor a few days per wk

## 2010-12-01 NOTE — Assessment & Plan Note (Signed)
Due for re-check   - for PSA today Lab Results  Component Value Date   PSA 4.59* 04/26/2010   PSA 4.03* 04/20/2009   PSA 3.30 09/03/2008

## 2010-12-01 NOTE — Assessment & Plan Note (Signed)
Uncontrolled, to incr the qvar bid,  to f/u any worsening symptoms or concerns \

## 2010-12-01 NOTE — Assessment & Plan Note (Addendum)
stable overall by hx and exam, most recent data reviewed with pt, and pt to continue medical treatment as before , pt reassured about the sweating not likely related to the diovan  BP Readings from Last 3 Encounters:  12/01/10 102/62  09/01/10 112/62  06/02/10 104/68

## 2010-12-01 NOTE — Patient Instructions (Signed)
Increase the qvar to twice per day You are given the prescription for the proair for "as needed" use as well The current medical regimen is effective;  continue present plan and medications. Please go to LAB in the Basement for the blood and/or urine tests to be done today Please call the phone number [phone] (the [place]) for results of testing in 2-3 days;  When calling, simply dial the number, and when prompted enter the MRN number above (the Medical Record Number) and the # key, then the message should start. Please return in 6 months, or sooner if needed

## 2010-12-01 NOTE — Progress Notes (Signed)
Subjective:    Patient ID: Stephen Wilkins, male    DOB: [birth year], 77 y.o.   MRN: [id number]  HPI  Pt here to f/u; had been doing well until the last 2 wks with increased mild nonprod cough, wheeze and sob, with several awakenings at night;  Pt denies fever, wt loss, night sweats, loss of appetite, or other constitutional symptoms, and no ST, HA.  Pt denies chest pain, increased sob or doe, wheezing, orthopnea, PND, increased LE swelling, palpitations, dizziness or syncope, except for the above.  Pt denies new neurological symptoms such as new headache, or facial or extremity weakness or numbness   Pt denies polydipsia, polyuria.  Overall good compliance with treatment, and good medicine tolerability, except only taking the crestor about half the time.  He is concerned the diovan tends to make [place] more [place] the [place], as the other golfers dont seem to sweat as much. Past Medical History  Diagnosis Date  . ABNORMAL ELECTROCARDIOGRAM 06/21/2007  . ALLERGIC RHINITIS 03/23/2007  . ASTHMATIC BRONCHITIS, ACUTE 04/27/2007  . BENIGN PROSTATIC HYPERTROPHY 03/23/2007  . BRONCHITIS NOT SPECIFIED AS ACUTE OR CHRONIC 04/21/2007  . BURSITIS, RIGHT HIP 07/31/2008  . Cough 01/29/2009  . ERECTILE DYSFUNCTION, ORGANIC 04/17/2009  . ERECTILE DYSFUNCTION 03/23/2007  . FREQUENCY, URINARY 04/26/2010  . GERD 03/23/2007  . HIATAL HERNIA 07/18/2007  . HYPERLIPIDEMIA 03/23/2007  . HYPERTENSION 03/23/2007  . MILD COGNITIVE IMPAIRMENT SO STATED 05/19/2010  . NEPHROLITHIASIS, HX OF 03/23/2007  . PSA, INCREASED 06/27/2008  . RASH-NONVESICULAR 03/23/2007  . REACTIVE AIRWAY DISEASE 01/14/2010  . SCHATZKI'S RING 07/18/2007  . SCIATICA, RIGHT 04/28/2008  . Wheezing 04/17/2009  . Dementia 09/01/2010   Past Surgical History  Procedure Date  . Tonsillectomy     reports that he has quit smoking. He does not have any smokeless tobacco history on file. He reports that he does not drink alcohol or use illicit drugs. family  history includes COPD in his other; Cancer in his brother and other; and Hypertension in his other. Allergies  Allergen Reactions  . Atorvastatin     REACTION: myalgia  . Doxazosin Mesylate     REACTION: dizziness   Current Outpatient Prescriptions on File Prior to Visit  Medication Sig Dispense Refill  . bisacodyl (DULCOLAX) 5 MG EC tablet Take 5 mg by mouth daily as needed.        . Cholecalciferol (VITAMIN D) 1000 UNITS capsule Take 1,000 Units by mouth daily.        . lansoprazole (PREVACID) 30 MG capsule Take 30 mg by mouth daily.        . Multiple Vitamins-Minerals (CENTRUM SILVER PO) Take by mouth daily.        . Omega-3 Fatty Acids (FISH OIL) 1200 MG CAPS Take by mouth daily.        . rosuvastatin (CRESTOR) 20 MG tablet Take 20 mg by mouth daily.        . Tamsulosin HCl (FLOMAX) 0.4 MG CAPS Take by mouth 2 (two) times daily.        Marland Kitchen triamcinolone (KENALOG) 0.1 % cream Apply topically 2 (two) times daily.        . valsartan (DIOVAN) 80 MG tablet Take 1 tablet (80 mg total) by mouth daily.  30 tablet  11   Review of Systems Review of Systems  Constitutional: Negative for diaphoresis and unexpected weight change.  HENT: Negative for drooling and tinnitus.   Eyes: Negative for photophobia and visual disturbance.  Respiratory: Negative for choking and stridor.   Gastrointestinal: Negative for vomiting and blood in stool.  Genitourinary: Negative for hematuria and decreased urine volume.        Objective:   Physical Exam BP 102/62  Pulse 81  Temp(Src) 98.3 F (36.8 C) (Oral)  Ht 6' (1.829 m)  Wt 207 lb 2 oz (93.951 kg)  BMI 28.09 kg/m2  SpO2 95% Physical Exam  VS noted Constitutional: Pt appears well-developed and well-nourished.  HENT: Head: Normocephalic.  Right Ear: External ear normal.  Left Ear: External ear normal.  Eyes: Conjunctivae and EOM are normal. Pupils are equal, round, and reactive to light.  Neck: Normal range of motion. Neck supple.  Cardiovascular:  Normal rate and regular rhythm.   Pulmonary/Chest: Effort normal and breath sounds normal.  Abd:  Soft, NT, non-distended, + BS Neurological: Pt is alert. No cranial nerve deficit.  Skin: Skin is warm. No erythema.  Psychiatric: Pt behavior is normal. Thought content c/w midl to mod dementia.         Assessment & Plan:

## 2010-12-17 ENCOUNTER — Encounter: Payer: Self-pay | Admitting: Internal Medicine

## 2010-12-17 ENCOUNTER — Ambulatory Visit (INDEPENDENT_AMBULATORY_CARE_PROVIDER_SITE_OTHER): Payer: Medicare Other | Admitting: Internal Medicine

## 2010-12-17 VITALS — BP 100/60 | HR 83 | Temp 98.0°F | Ht 72.0 in | Wt 200.0 lb

## 2010-12-17 DIAGNOSIS — J45909 Unspecified asthma, uncomplicated: Secondary | ICD-10-CM

## 2010-12-17 DIAGNOSIS — I10 Essential (primary) hypertension: Secondary | ICD-10-CM

## 2010-12-17 DIAGNOSIS — F0390 Unspecified dementia without behavioral disturbance: Secondary | ICD-10-CM

## 2010-12-17 MED ORDER — METHYLPREDNISOLONE ACETATE 80 MG/ML IJ SUSP
120.0000 mg | Freq: Once | INTRAMUSCULAR | Status: AC
  Administered 2010-12-17: 120 mg via INTRAMUSCULAR

## 2010-12-17 MED ORDER — PREDNISONE 10 MG PO TABS: ORAL_TABLET | ORAL | Status: DC

## 2010-12-17 NOTE — Assessment & Plan Note (Signed)
Mild to mod flare despite good hoome med compliacne, for depomedroll IM, short home predpack,  to f/u any worsening symptoms or concerns, Continue all other medications as before

## 2010-12-17 NOTE — Patient Instructions (Addendum)
You had the steroid shot today Take all new medications as prescribed - the short course of prednisone The current medical regimen is effective;  continue present plan and medications. Have a good time at your Daughter's place in [place]

## 2010-12-17 NOTE — Assessment & Plan Note (Signed)
stable overall by hx and exam, most recent data reviewed with pt, and pt to continue medical treatment as before  BP Readings from Last 3 Encounters:  12/17/10 100/60  12/01/10 102/62  09/01/10 112/62

## 2010-12-18 ENCOUNTER — Encounter: Payer: Self-pay | Admitting: Internal Medicine

## 2010-12-18 NOTE — Assessment & Plan Note (Signed)
stable overall by hx and exam, most recent data reviewed with pt, and pt to continue medical treatment as before  Lab Results  Component Value Date   WBC 8.3 04/26/2010   HGB 14.8 04/26/2010   HCT 42.7 04/26/2010   PLT 242.0 04/26/2010   CHOL 170 12/01/2010   TRIG 240.0* 12/01/2010   HDL 44.60 12/01/2010   LDLDIRECT 100.6 12/01/2010   ALT 16 04/26/2010   AST 20 04/26/2010   NA 138 04/26/2010   K 4.9 04/26/2010   CL 100 04/26/2010   CREATININE 1.2 04/26/2010   BUN 25* 04/26/2010   CO2 32 04/26/2010   TSH 5.22 04/26/2010   PSA 3.89 12/01/2010

## 2010-12-18 NOTE — Progress Notes (Signed)
Subjective:    Patient ID: Stephen Wilkins, male    DOB: 1927-12-25, 77 y.o.   MRN: [id number]  HPI  Here with 2 wks worsening mild cough, wheezing, sob without HA, fever, ST, productivity, and Pt denies chest pain,  orthopnea, PND, increased LE swelling, palpitations, dizziness or syncope.  Pt denies new neurological symptoms such as new headache, or facial or extremity weakness or numbness   Pt denies polydipsia, polyuria.  Dementia overall stable symptomatically with gradual worsening at best, and not assoc with behavioral changes such as hallucinations, paranoia, or agitation.   Pt denies fever, wt loss, night sweats, loss of appetite, or other constitutional symptoms. Has hx of Asthma, and states overall good complaicne with meds, tends to have flare in spring and fall. Past Medical History  Diagnosis Date  . ABNORMAL ELECTROCARDIOGRAM 06/21/2007  . ALLERGIC RHINITIS 03/23/2007  . ASTHMATIC BRONCHITIS, ACUTE 04/27/2007  . BENIGN PROSTATIC HYPERTROPHY 03/23/2007  . BRONCHITIS NOT SPECIFIED AS ACUTE OR CHRONIC 04/21/2007  . BURSITIS, RIGHT HIP 07/31/2008  . Cough 01/29/2009  . ERECTILE DYSFUNCTION, ORGANIC 04/17/2009  . ERECTILE DYSFUNCTION 03/23/2007  . FREQUENCY, URINARY 04/26/2010  . GERD 03/23/2007  . HIATAL HERNIA 07/18/2007  . HYPERLIPIDEMIA 03/23/2007  . HYPERTENSION 03/23/2007  . MILD COGNITIVE IMPAIRMENT SO STATED 05/19/2010  . NEPHROLITHIASIS, HX OF 03/23/2007  . PSA, INCREASED 06/27/2008  . RASH-NONVESICULAR 03/23/2007  . REACTIVE AIRWAY DISEASE 01/14/2010  . SCHATZKI'S RING 07/18/2007  . SCIATICA, RIGHT 04/28/2008  . Wheezing 04/17/2009  . Dementia 09/01/2010   Past Surgical History  Procedure Date  . Tonsillectomy     reports that he has quit smoking. He does not have any smokeless tobacco history on file. He reports that he does not drink alcohol or use illicit drugs. family history includes COPD in his other; Cancer in his brother and other; and Hypertension in his  other. Allergies  Allergen Reactions  . Atorvastatin     REACTION: myalgia  . Doxazosin Mesylate     REACTION: dizziness   Current Outpatient Prescriptions on File Prior to Visit  Medication Sig Dispense Refill  . albuterol (PROVENTIL HFA;VENTOLIN HFA) 108 (90 BASE) MCG/ACT inhaler Inhale 2 puffs into the lungs every 6 (six) hours as needed for wheezing.  1 Inhaler  11  . beclomethasone (QVAR) 80 MCG/ACT inhaler Inhale 2 puffs into the lungs 2 (two) times daily. Rinse mouth after  1 Inhaler  11  . bisacodyl (DULCOLAX) 5 MG EC tablet Take 5 mg by mouth daily as needed.        . Cholecalciferol (VITAMIN D) 1000 UNITS capsule Take 1,000 Units by mouth daily.        . lansoprazole (PREVACID) 30 MG capsule Take 30 mg by mouth daily.        . Multiple Vitamins-Minerals (CENTRUM SILVER PO) Take by mouth daily.        . Omega-3 Fatty Acids (FISH OIL) 1200 MG CAPS Take by mouth daily.        . rosuvastatin (CRESTOR) 20 MG tablet Take 20 mg by mouth daily.        . Tamsulosin HCl (FLOMAX) 0.4 MG CAPS Take by mouth 2 (two) times daily.        Marland Kitchen triamcinolone (KENALOG) 0.1 % cream Apply topically 2 (two) times daily.        . valsartan (DIOVAN) 80 MG tablet Take 1 tablet (80 mg total) by mouth daily.  30 tablet  11   Review of Systems Review  of Systems  Constitutional: Negative for diaphoresis and unexpected weight change.  HENT: Negative for drooling and tinnitus.   Eyes: Negative for photophobia and visual disturbance.  Respiratory: Negative for choking and stridor.   Gastrointestinal: Negative for vomiting and blood in stool.  Genitourinary: Negative for hematuria and decreased urine volume.     Objective:   Physical Exam BP 100/60  Pulse 83  Temp(Src) 98 F (36.7 C) (Oral)  Ht 6' (1.829 m)  Wt 200 lb (90.719 kg)  BMI 27.12 kg/m2  SpO2 94% Physical Exam  VS noted, not ill appaering Constitutional: Pt appears well-developed and well-nourished.  HENT: Head: Normocephalic.  Right Ear:  External ear normal.  Left Ear: External ear normal.  Bilat tm's mild erythema.  Sinus nontender.  Pharynx mild erythema Eyes: Conjunctivae and EOM are normal. Pupils are equal, round, and reactive to light.  Neck: Normal range of motion. Neck supple.  Cardiovascular: Normal rate and regular rhythm.   Pulmonary/Chest: Effort normal and breath sounds mild decreased, bilat wheeze.  Neurological: Pt is alert. No cranial nerve deficit.  Skin: Skin is warm. No erythema.  Psychiatric: Pt behavior is normal. Thought content c/w dementia       Assessment & Plan:

## 2011-01-19 ENCOUNTER — Encounter: Payer: Self-pay | Admitting: Internal Medicine

## 2011-01-19 ENCOUNTER — Ambulatory Visit (INDEPENDENT_AMBULATORY_CARE_PROVIDER_SITE_OTHER): Payer: Medicare Other | Admitting: Internal Medicine

## 2011-01-19 DIAGNOSIS — J45909 Unspecified asthma, uncomplicated: Secondary | ICD-10-CM

## 2011-01-19 DIAGNOSIS — I10 Essential (primary) hypertension: Secondary | ICD-10-CM

## 2011-01-19 DIAGNOSIS — F0390 Unspecified dementia without behavioral disturbance: Secondary | ICD-10-CM

## 2011-01-19 DIAGNOSIS — J309 Allergic rhinitis, unspecified: Secondary | ICD-10-CM

## 2011-01-19 MED ORDER — CETIRIZINE HCL 10 MG PO TABS: 10.0000 mg | ORAL_TABLET | Freq: Every day | ORAL | Status: DC

## 2011-01-19 MED ORDER — ALBUTEROL SULFATE HFA 108 (90 BASE) MCG/ACT IN AERS: 2.0000 | INHALATION_SPRAY | Freq: Four times a day (QID) | RESPIRATORY_TRACT | Status: DC | PRN

## 2011-01-19 MED ORDER — PREDNISONE 10 MG PO TABS: ORAL_TABLET | ORAL | Status: DC

## 2011-01-19 MED ORDER — METHYLPREDNISOLONE ACETATE 80 MG/ML IJ SUSP
120.0000 mg | Freq: Once | INTRAMUSCULAR | Status: AC
  Administered 2011-01-19: 120 mg via INTRAMUSCULAR

## 2011-01-19 MED ORDER — FLUTICASONE PROPIONATE 50 MCG/ACT NA SUSP: 2.0000 | Freq: Every day | NASAL | Status: DC

## 2011-01-19 NOTE — Progress Notes (Signed)
Subjective:    Patient ID: Stephen Wilkins, male    DOB: 08/23/1927, 77 y.o.   MRN: [id number]  HPI  Here to f/u - overall asthma symptoms much improved and states good compliacne with the qvar, though he does not use the albuterol at all, and seems confused by this (though this was explained last visit); has some minor wheezing ongoing per pt but no sob;  Does have significant nasal congestion and nonprod cough though, some worse in the past few wks, without fever, HA, ear pain, neck pain and Pt denies chest pain, increased sob or doe, orthopnea, PND, increased LE swelling, palpitations, dizziness or syncope.  Dementia overall stable symptomatically with gradual worsening at best, and not assoc with behavioral changes such as hallucinations, paranoia, or agitation.  Pt denies new neurological symptoms such as new headache, or facial or extremity weakness or numbness   Pt denies polydipsia, polyuria. Does state tx last visit with the depomedrol IM and predpack completely cleared all symtpoms for over at least 1 wk. Past Medical History  Diagnosis Date  . ABNORMAL ELECTROCARDIOGRAM 06/21/2007  . ALLERGIC RHINITIS 03/23/2007  . ASTHMATIC BRONCHITIS, ACUTE 04/27/2007  . BENIGN PROSTATIC HYPERTROPHY 03/23/2007  . BRONCHITIS NOT SPECIFIED AS ACUTE OR CHRONIC 04/21/2007  . BURSITIS, RIGHT HIP 07/31/2008  . Cough 01/29/2009  . ERECTILE DYSFUNCTION, ORGANIC 04/17/2009  . ERECTILE DYSFUNCTION 03/23/2007  . FREQUENCY, URINARY 04/26/2010  . GERD 03/23/2007  . HIATAL HERNIA 07/18/2007  . HYPERLIPIDEMIA 03/23/2007  . HYPERTENSION 03/23/2007  . MILD COGNITIVE IMPAIRMENT SO STATED 05/19/2010  . NEPHROLITHIASIS, HX OF 03/23/2007  . PSA, INCREASED 06/27/2008  . RASH-NONVESICULAR 03/23/2007  . REACTIVE AIRWAY DISEASE 01/14/2010  . SCHATZKI'S RING 07/18/2007  . SCIATICA, RIGHT 04/28/2008  . Wheezing 04/17/2009  . Dementia 09/01/2010   Past Surgical History  Procedure Date  . Tonsillectomy     reports that he has  quit smoking. He does not have any smokeless tobacco history on file. He reports that he does not drink alcohol or use illicit drugs. family history includes COPD in his other; Cancer in his brother and other; and Hypertension in his other. Allergies  Allergen Reactions  . Atorvastatin     REACTION: myalgia  . Doxazosin Mesylate     REACTION: dizziness   Current Outpatient Prescriptions on File Prior to Visit  Medication Sig Dispense Refill  . beclomethasone (QVAR) 80 MCG/ACT inhaler Inhale 2 puffs into the lungs 2 (two) times daily. Rinse mouth after  1 Inhaler  11  . bisacodyl (DULCOLAX) 5 MG EC tablet Take 5 mg by mouth daily as needed.        . Cholecalciferol (VITAMIN D) 1000 UNITS capsule Take 1,000 Units by mouth daily.        . lansoprazole (PREVACID) 30 MG capsule Take 30 mg by mouth daily.        . Multiple Vitamins-Minerals (CENTRUM SILVER PO) Take by mouth daily.        . Omega-3 Fatty Acids (FISH OIL) 1200 MG CAPS Take by mouth daily.        . rosuvastatin (CRESTOR) 20 MG tablet Take 20 mg by mouth daily.        . Tamsulosin HCl (FLOMAX) 0.4 MG CAPS Take by mouth 2 (two) times daily.        Marland Kitchen triamcinolone (KENALOG) 0.1 % cream Apply topically 2 (two) times daily.        . valsartan (DIOVAN) 80 MG tablet Take 1 tablet (80 mg  total) by mouth daily.  30 tablet  11   No current facility-administered medications on file prior to visit.   Review of Systems Review of Systems  Constitutional: Negative for diaphoresis and unexpected weight change.  HENT: Negative for drooling and tinnitus.   Eyes: Negative for photophobia and visual disturbance.  Respiratory: Negative for choking and stridor.   Gastrointestinal: Negative for vomiting and blood in stool.  Genitourinary: Negative for hematuria and decreased urine volume.        Objective:   Physical Exam BP 122/72  Pulse 88  Temp(Src) 97.1 F (36.2 C) (Oral)  Wt 204 lb (92.534 kg)  SpO2 95% Physical Exam  VS noted, not  ill appearing Constitutional: Pt appears well-developed and well-nourished.  HENT: Head: Normocephalic.  Right Ear: External ear normal.  Left Ear: External ear normal.  Bilat tm's mild erythema.  Sinus nontender.  Pharynx mild erythema Eyes: Conjunctivae and EOM are normal. Pupils are equal, round, and reactive to light.  Neck: Normal range of motion. Neck supple.  Cardiovascular: Normal rate and regular rhythm.   Pulmonary/Chest: Effort normal and breath sounds mild decreased with few wheeze bilat Abd:  Soft, NT, non-distended, + BS Neurological: Pt is alert. No cranial nerve deficit.  Skin: Skin is warm. No erythema. no edema Psychiatric: Pt behavior is normal. Thought content with mild to mod ST memory/cognitive dysfunction     Assessment & Plan:

## 2011-01-19 NOTE — Assessment & Plan Note (Signed)
Much improved but with trace wheezing, for tx as per allergic rhinitis today (steroid tx), but also encouraged to use the albuterol inh prn which he is not doing

## 2011-01-19 NOTE — Assessment & Plan Note (Signed)
stable overall by hx and exam, most recent data reviewed with pt, and pt to continue medical treatment as before  BP Readings from Last 3 Encounters:  01/19/11 122/72  12/17/10 100/60  12/01/10 102/62

## 2011-01-19 NOTE — Patient Instructions (Addendum)
You had the steroid shot today for the nasal allergies and wheezing Take all new medications as prescribed - the prednisone as before (for just a short prescription) Take all new medications as prescribed - the zyrtec and Flonase (both generic and sent to your pharmacy) Remember, you can also use the Albuterol inhaler "as needed" during the day for wheezing or shortness of breath (sent to your pharmacy) Continue all other medications as before - including the Qvar as you are doing

## 2011-01-19 NOTE — Assessment & Plan Note (Signed)
stable overall by hx and exam, most recent data reviewed with pt, and pt to continue medical treatment as before  Lab Results  Component Value Date   WBC 8.3 04/26/2010   HGB 14.8 04/26/2010   HCT 42.7 04/26/2010   PLT 242.0 04/26/2010   GLUCOSE 80 04/26/2010   CHOL 170 12/01/2010   TRIG 240.0* 12/01/2010   HDL 44.60 12/01/2010   LDLDIRECT 100.6 12/01/2010   LDLCALC 96 04/20/2009   ALT 16 04/26/2010   AST 20 04/26/2010   NA 138 04/26/2010   K 4.9 04/26/2010   CL 100 04/26/2010   CREATININE 1.2 04/26/2010   BUN 25* 04/26/2010   CO2 32 04/26/2010   TSH 5.22 04/26/2010   PSA 3.89 12/01/2010

## 2011-01-19 NOTE — Assessment & Plan Note (Signed)
With uncontrolled symptoms, though asthma overall improved;  For depomedrol IM, predpack asd, and zyrtec/flonase asd;  to f/u any worsening symptoms or concerns, consider allergy referral

## 2011-02-10 ENCOUNTER — Encounter: Payer: Self-pay | Admitting: Internal Medicine

## 2011-03-15 ENCOUNTER — Other Ambulatory Visit: Payer: Self-pay | Admitting: Internal Medicine

## 2011-03-24 ENCOUNTER — Ambulatory Visit: Payer: Medicare Other | Admitting: Internal Medicine

## 2011-03-30 ENCOUNTER — Other Ambulatory Visit: Payer: Self-pay | Admitting: Internal Medicine

## 2011-03-30 NOTE — Telephone Encounter (Signed)
Done

## 2011-04-13 ENCOUNTER — Ambulatory Visit: Payer: Medicare Other | Admitting: Internal Medicine

## 2011-04-13 ENCOUNTER — Ambulatory Visit (INDEPENDENT_AMBULATORY_CARE_PROVIDER_SITE_OTHER): Payer: Medicare Other | Admitting: Internal Medicine

## 2011-04-13 ENCOUNTER — Encounter: Payer: Self-pay | Admitting: Internal Medicine

## 2011-04-13 VITALS — BP 110/70 | HR 80 | Temp 97.9°F | Ht 73.0 in | Wt 205.4 lb

## 2011-04-13 DIAGNOSIS — J45909 Unspecified asthma, uncomplicated: Secondary | ICD-10-CM | POA: Diagnosis not present

## 2011-04-13 DIAGNOSIS — I10 Essential (primary) hypertension: Secondary | ICD-10-CM

## 2011-04-13 DIAGNOSIS — J45901 Unspecified asthma with (acute) exacerbation: Secondary | ICD-10-CM | POA: Diagnosis not present

## 2011-04-13 DIAGNOSIS — K219 Gastro-esophageal reflux disease without esophagitis: Secondary | ICD-10-CM | POA: Diagnosis not present

## 2011-04-13 MED ORDER — METHYLPREDNISOLONE ACETATE PF 80 MG/ML IJ SUSP
120.0000 mg | Freq: Once | INTRAMUSCULAR | Status: AC
  Administered 2011-04-13: 120 mg via INTRAMUSCULAR

## 2011-04-13 MED ORDER — BECLOMETHASONE DIPROPIONATE 80 MCG/ACT IN AERS: 2.0000 | INHALATION_SPRAY | Freq: Two times a day (BID) | RESPIRATORY_TRACT | Status: DC

## 2011-04-13 MED ORDER — PREDNISONE 10 MG PO TABS: 10.0000 mg | ORAL_TABLET | Freq: Every day | ORAL | Status: DC

## 2011-04-13 MED ORDER — ALBUTEROL SULFATE HFA 108 (90 BASE) MCG/ACT IN AERS: 2.0000 | INHALATION_SPRAY | Freq: Four times a day (QID) | RESPIRATORY_TRACT | Status: DC | PRN

## 2011-04-13 NOTE — Progress Notes (Signed)
Subjective:    Patient ID: Stephen Wilkins, male    DOB: August 01, 1927, 78 y.o.   MRN: [id number]  HPI   Pt denies fever, wt loss, night sweats, loss of appetite, or other constitutional symptoms, and Still confused about the purpose and use of his inhalers it seems.  Today here as per last visit with increased 1 wk wheezing, sob/doe without fever, prod cough, and Pt denies chest pain ,orthopnea, PND, increased LE swelling, palpitations, dizziness or syncope. Pt denies new neurological symptoms such as new headache, or facial or extremity weakness or numbness   Pt denies polydipsia, polyuria Pt states overall good compliance with meds, trying to follow lower cholesterol, diabetic diet, wt overall stable but little exercise however.   Denies worsening reflux, dysphagia, abd pain, n/v, bowel change or blood.  Past Medical History  Diagnosis Date  . ABNORMAL ELECTROCARDIOGRAM 06/21/2007  . ALLERGIC RHINITIS 03/23/2007  . ASTHMATIC BRONCHITIS, ACUTE 04/27/2007  . BENIGN PROSTATIC HYPERTROPHY 03/23/2007  . BRONCHITIS NOT SPECIFIED AS ACUTE OR CHRONIC 04/21/2007  . BURSITIS, RIGHT HIP 07/31/2008  . Cough 01/29/2009  . ERECTILE DYSFUNCTION, ORGANIC 04/17/2009  . ERECTILE DYSFUNCTION 03/23/2007  . FREQUENCY, URINARY 04/26/2010  . GERD 03/23/2007  . HIATAL HERNIA 07/18/2007  . HYPERLIPIDEMIA 03/23/2007  . HYPERTENSION 03/23/2007  . MILD COGNITIVE IMPAIRMENT SO STATED 05/19/2010  . NEPHROLITHIASIS, HX OF 03/23/2007  . PSA, INCREASED 06/27/2008  . RASH-NONVESICULAR 03/23/2007  . REACTIVE AIRWAY DISEASE 01/14/2010  . SCHATZKI'S RING 07/18/2007  . SCIATICA, RIGHT 04/28/2008  . Wheezing 04/17/2009  . Dementia 09/01/2010   Past Surgical History  Procedure Date  . Tonsillectomy     reports that he has quit smoking. He does not have any smokeless tobacco history on file. He reports that he does not drink alcohol or use illicit drugs. family history includes COPD in his other; Cancer in his brother and other; and  Hypertension in his other. Allergies  Allergen Reactions  . Atorvastatin     REACTION: myalgia  . Doxazosin Mesylate     REACTION: dizziness   Current Outpatient Prescriptions on File Prior to Visit  Medication Sig Dispense Refill  . bisacodyl (DULCOLAX) 5 MG EC tablet Take 5 mg by mouth daily as needed.        . cetirizine (ZYRTEC) 10 MG tablet Take 1 tablet (10 mg total) by mouth daily. As needed for allergies  90 tablet  3  . Cholecalciferol (VITAMIN D) 1000 UNITS capsule Take 1,000 Units by mouth daily.        . CRESTOR 20 MG tablet take 1 tablet by mouth once daily  30 tablet  5  . lansoprazole (PREVACID) 30 MG capsule TAKE 1 CAPSULE BY MOUTH ONCE DAILY  30 capsule  4  . Multiple Vitamins-Minerals (CENTRUM SILVER PO) Take by mouth daily.        . Omega-3 Fatty Acids (FISH OIL) 1200 MG CAPS Take by mouth daily.        . Tamsulosin HCl (FLOMAX) 0.4 MG CAPS Take by mouth 2 (two) times daily.        Marland Kitchen triamcinolone (KENALOG) 0.1 % cream Apply topically 2 (two) times daily.        . valsartan (DIOVAN) 80 MG tablet Take 1 tablet (80 mg total) by mouth daily.  30 tablet  11   Review of Systems Review of Systems  Constitutional: Negative for diaphoresis and unexpected weight change.  HENT: Negative for drooling and tinnitus.   Eyes: Negative for photophobia  and visual disturbance.  Respiratory: Negative for choking and stridor.   Gastrointestinal: Negative for vomiting and blood in stool.  Genitourinary: Negative for hematuria and decreased urine volume.      Objective:   Physical Exam BP 110/70  Pulse 80  Temp(Src) 97.9 F (36.6 C) (Oral)  Ht 6\' 1"  (1.854 m)  Wt 205 lb 6 oz (93.157 kg)  BMI 27.10 kg/m2  SpO2 97% Physical Exam  VS noted Constitutional: Pt appears well-developed and well-nourished.  HENT: Head: Normocephalic.  Right Ear: External ear normal.  Left Ear: External ear normal.  Eyes: Conjunctivae and EOM are normal. Pupils are equal, round, and reactive to  light.  Neck: Normal range of motion. Neck supple.  Cardiovascular: Normal rate and regular rhythm.   Pulmonary/Chest: Effort normal and breath sounds mild decreased with mild wheeze bilat, without rales.  Abd: soft, NT, + BS Neurological: Pt is alert. No cranial nerve deficit.  Skin: Skin is warm. No erythema.  Psychiatric: Pt behavior is normal. Thought content c/w mild to mod memory dysfunction.      Assessment & Plan:

## 2011-04-13 NOTE — Patient Instructions (Addendum)
You had the steroid shot today Take all new medications as prescribed - the prednisone is sent to your pharmacy Continue all other medications as before Remember, your QVAR is meant to be used twice per day (regularly every day), and the Albuterol Inhaler is only to be used if needed if you have further shortness of breath

## 2011-04-17 ENCOUNTER — Encounter: Payer: Self-pay | Admitting: Internal Medicine

## 2011-04-17 NOTE — Assessment & Plan Note (Signed)
stable overall by hx and exam, most recent data reviewed with pt, and pt to continue medical treatment as before  Lab Results  Component Value Date   WBC 8.3 04/26/2010   HGB 14.8 04/26/2010   HCT 42.7 04/26/2010   PLT 242.0 04/26/2010   GLUCOSE 80 04/26/2010   CHOL 170 12/01/2010   TRIG 240.0* 12/01/2010   HDL 44.60 12/01/2010   LDLDIRECT 100.6 12/01/2010   LDLCALC 96 04/20/2009   ALT 16 04/26/2010   AST 20 04/26/2010   NA 138 04/26/2010   K 4.9 04/26/2010   CL 100 04/26/2010   CREATININE 1.2 04/26/2010   BUN 25* 04/26/2010   CO2 32 04/26/2010   TSH 5.22 04/26/2010   PSA 3.89 12/01/2010

## 2011-04-17 NOTE — Assessment & Plan Note (Signed)
Mild, ? Related to lack of qvar inhaler use? Pt again educated on correct use and purpose of inhalers,  For depomedrol IM, and predpack asd,  to f/u any worsening symptoms or concerns

## 2011-04-17 NOTE — Assessment & Plan Note (Signed)
stable overall by hx and exam, most recent data reviewed with pt, and pt to continue medical treatment as before  BP Readings from Last 3 Encounters:  04/13/11 110/70  01/19/11 122/72  12/17/10 100/60

## 2011-05-12 ENCOUNTER — Telehealth: Payer: Self-pay | Admitting: Internal Medicine

## 2011-05-12 NOTE — Telephone Encounter (Signed)
Spoke with patient and he states for the last couple of months, he has had fecal incontinence. Scheduled patient for OV on 05/25/11 at 2:00 PM.

## 2011-05-16 ENCOUNTER — Encounter: Payer: Self-pay | Admitting: *Deleted

## 2011-05-25 ENCOUNTER — Ambulatory Visit (INDEPENDENT_AMBULATORY_CARE_PROVIDER_SITE_OTHER): Payer: Medicare Other | Admitting: Internal Medicine

## 2011-05-25 ENCOUNTER — Encounter: Payer: Self-pay | Admitting: Internal Medicine

## 2011-05-25 VITALS — BP 124/84 | HR 80 | Ht 72.0 in | Wt 203.4 lb

## 2011-05-25 DIAGNOSIS — K5900 Constipation, unspecified: Secondary | ICD-10-CM | POA: Diagnosis not present

## 2011-05-25 DIAGNOSIS — R197 Diarrhea, unspecified: Secondary | ICD-10-CM | POA: Diagnosis not present

## 2011-05-25 MED ORDER — POLYETHYLENE GLYCOL 3350 17 GM/SCOOP PO POWD: ORAL | Status: DC

## 2011-05-25 NOTE — Progress Notes (Signed)
Stephen Wilkins 29-Jul-1927 MRN [id number]  History of Present Illness:  This is an 78 year old white male with a history of chronic constipation taking Metamucil and MiraLax 17 g daily. He has developed loose stools and seepage of a small amount of stool on his underwear He discontinued Metamucil and Miralax 2 weeks ago and his symptoms subsided.Marland Kitchen He denies any rectal bleeding or abdominal pain. His last colonoscopy in August 2006 was a normal exam. A CT scan of the abdomen in 2001 for hematuria showed a left renal calculus.    Past Medical History  Diagnosis Date  . ABNORMAL ELECTROCARDIOGRAM 06/21/2007  . ALLERGIC RHINITIS 03/23/2007  . ASTHMATIC BRONCHITIS, ACUTE 04/27/2007  . BENIGN PROSTATIC HYPERTROPHY 03/23/2007  . BRONCHITIS NOT SPECIFIED AS ACUTE OR CHRONIC 04/21/2007  . BURSITIS, RIGHT HIP 07/31/2008  . Cough 01/29/2009  . ERECTILE DYSFUNCTION, ORGANIC 04/17/2009  . ERECTILE DYSFUNCTION 03/23/2007  . FREQUENCY, URINARY 04/26/2010  . GERD 03/23/2007  . HIATAL HERNIA   . HYPERLIPIDEMIA 03/23/2007  . HYPERTENSION 03/23/2007  . MILD COGNITIVE IMPAIRMENT SO STATED 05/19/2010  . NEPHROLITHIASIS, HX OF 03/23/2007  . PSA, INCREASED 06/27/2008  . RASH-NONVESICULAR 03/23/2007  . REACTIVE AIRWAY DISEASE 01/14/2010  . SCHATZKI'S RING   . SCIATICA, RIGHT 04/28/2008  . Wheezing 04/17/2009  . Dementia 09/01/2010   Past Surgical History  Procedure Date  . Tonsillectomy     reports that he has quit smoking. He has never used smokeless tobacco. He reports that he does not drink alcohol or use illicit drugs. family history includes Cancer in his brother and unspecified family member; Emphysema in his brother; and Hypertension in an unspecified family member. Allergies  Allergen Reactions  . Atorvastatin     REACTION: myalgia  . Doxazosin Mesylate     REACTION: dizziness        Review of Systems: Negative for chest pain dysphagia abdominal pain  The remainder of the 10 point ROS is  negative except as outlined in H&P   Physical Exam: General appearance  Well developed, in no distress. Eyes- non icteric. HEENT nontraumatic, normocephalic. Mouth no lesions, tongue papillated, no cheilosis. Neck supple without adenopathy, thyroid not enlarged, no carotid bruits, no JVD. Lungs Clear to auscultation bilaterally. Cor normal S1, normal S2, regular rhythm, no murmur,  quiet precordium. Abdomen: Mildly protuberant. Normal active bowel sounds. Nontender. No palpable mass. Rectal: Soft Hemoccult negative stool Extremities no pedal edema. Skin no lesions. Neurological alert and oriented x 3. Psychological normal mood and affect.  Assessment and Plan:  Problem #1 Seepage of liquid stool likely from overdose of MiraLax. He has discontinued his MiraLax recently and his stools have normalized. Now, he seems to be more constipated. I have asked him to reduce his MiraLax dose to 1/2 capful every day or every other day and resume his Metamucil. He will be due for a recall colonoscopy in August 2016.   05/25/2011 [name]

## 2011-05-25 NOTE — Patient Instructions (Signed)
You will be due for a recall colonoscopy in 11/2014. We will send you a reminder in the mail when it gets closer to that time. CC: Dr Cathlean Cower

## 2011-06-01 ENCOUNTER — Encounter: Payer: Self-pay | Admitting: Internal Medicine

## 2011-06-01 ENCOUNTER — Ambulatory Visit (INDEPENDENT_AMBULATORY_CARE_PROVIDER_SITE_OTHER): Payer: Medicare Other | Admitting: Internal Medicine

## 2011-06-01 DIAGNOSIS — I10 Essential (primary) hypertension: Secondary | ICD-10-CM

## 2011-06-01 DIAGNOSIS — F0390 Unspecified dementia without behavioral disturbance: Secondary | ICD-10-CM | POA: Diagnosis not present

## 2011-06-01 DIAGNOSIS — J45909 Unspecified asthma, uncomplicated: Secondary | ICD-10-CM | POA: Diagnosis not present

## 2011-06-01 DIAGNOSIS — T148XXA Other injury of unspecified body region, initial encounter: Secondary | ICD-10-CM | POA: Diagnosis not present

## 2011-06-01 NOTE — Patient Instructions (Signed)
OK to Continue all other medications as before Please return in 6 months, or sooner if needed

## 2011-06-05 ENCOUNTER — Encounter: Payer: Self-pay | Admitting: Internal Medicine

## 2011-06-05 DIAGNOSIS — T148XXA Other injury of unspecified body region, initial encounter: Secondary | ICD-10-CM | POA: Insufficient documentation

## 2011-06-05 NOTE — Progress Notes (Signed)
Subjective:    Patient ID: Stephen Wilkins, male    DOB: 1928-02-21, 78 y.o.   MRN: [id number]  HPI  Here to f/u; overall doing ok,  Pt denies chest pain, increased sob or doe, wheezing, orthopnea, PND, increased LE swelling, palpitations, dizziness or syncope.  Pt denies new neurological symptoms such as new headache, or facial or extremity weakness or numbness   Pt denies polydipsia, polyuria  Pt states overall good compliance with meds, trying to follow lower cholesterol diet, wt overall stable but little exercise however.  Dementia overall stable symptomatically with gradual worsening at best, and not assoc with behavioral changes such as hallucinations, paranoia, or agitation.  Rectal seepage improved with decreased miralax.  Does have bilat few arm bruising and seems fixated on blaming the diovan for this, very dissapointed when I suggest the diovan no likely related Past Medical History  Diagnosis Date  . ABNORMAL ELECTROCARDIOGRAM 06/21/2007  . ALLERGIC RHINITIS 03/23/2007  . ASTHMATIC BRONCHITIS, ACUTE 04/27/2007  . BENIGN PROSTATIC HYPERTROPHY 03/23/2007  . BRONCHITIS NOT SPECIFIED AS ACUTE OR CHRONIC 04/21/2007  . BURSITIS, RIGHT HIP 07/31/2008  . Cough 01/29/2009  . ERECTILE DYSFUNCTION, ORGANIC 04/17/2009  . ERECTILE DYSFUNCTION 03/23/2007  . FREQUENCY, URINARY 04/26/2010  . GERD 03/23/2007  . HIATAL HERNIA   . HYPERLIPIDEMIA 03/23/2007  . HYPERTENSION 03/23/2007  . MILD COGNITIVE IMPAIRMENT SO STATED 05/19/2010  . NEPHROLITHIASIS, HX OF 03/23/2007  . PSA, INCREASED 06/27/2008  . RASH-NONVESICULAR 03/23/2007  . REACTIVE AIRWAY DISEASE 01/14/2010  . SCHATZKI'S RING   . SCIATICA, RIGHT 04/28/2008  . Wheezing 04/17/2009  . Dementia 09/01/2010   Past Surgical History  Procedure Date  . Tonsillectomy     reports that he has quit smoking. He has never used smokeless tobacco. He reports that he does not drink alcohol or use illicit drugs. family history includes Cancer in his brother and  unspecified family member; Emphysema in his brother; and Hypertension in an unspecified family member. Allergies  Allergen Reactions  . Atorvastatin     REACTION: myalgia  . Doxazosin Mesylate     REACTION: dizziness   Current Outpatient Prescriptions on File Prior to Visit  Medication Sig Dispense Refill  . albuterol (PROVENTIL HFA;VENTOLIN HFA) 108 (90 BASE) MCG/ACT inhaler Inhale 2 puffs into the lungs every 6 (six) hours as needed for wheezing.  1 Inhaler  11  . beclomethasone (QVAR) 80 MCG/ACT inhaler Inhale 2 puffs into the lungs 2 (two) times daily. Rinse mouth after  1 Inhaler  11  . bisacodyl (DULCOLAX) 5 MG EC tablet Take 5 mg by mouth daily as needed.        . Cholecalciferol (VITAMIN D) 1000 UNITS capsule Take 1,000 Units by mouth daily.        . CRESTOR 20 MG tablet take 1 tablet by mouth once daily  30 tablet  5  . lansoprazole (PREVACID) 30 MG capsule TAKE 1 CAPSULE BY MOUTH ONCE DAILY  30 capsule  4  . Multiple Vitamins-Minerals (CENTRUM SILVER PO) Take by mouth daily.        . Omega-3 Fatty Acids (FISH OIL) 1200 MG CAPS Take by mouth daily.        . polyethylene glycol powder (MIRALAX) powder Take 1/2 capful by mouth once daily  1 g  0  . Tamsulosin HCl (FLOMAX) 0.4 MG CAPS Take by mouth 2 (two) times daily.        Marland Kitchen triamcinolone (KENALOG) 0.1 % cream Apply topically 2 (two) times daily.        Marland Kitchen  valsartan (DIOVAN) 80 MG tablet Take 1 tablet (80 mg total) by mouth daily.  30 tablet  11   Review of Systems Review of Systems  Constitutional: Negative for diaphoresis and unexpected weight change.  HENT: Negative for drooling and tinnitus.   Eyes: Negative for photophobia and visual disturbance.  Respiratory: Negative for choking and stridor.   Gastrointestinal: Negative for vomiting and blood in stool.  Genitourinary: Negative for hematuria and decreased urine volume.    Objective:   Physical Exam BP 110/60  Pulse 82  Temp(Src) 97 F (36.1 C) (Oral)  Ht 6' (1.829  m)  Wt 204 lb (92.534 kg)  BMI 27.67 kg/m2  SpO2 95% Physical Exam  VS noted Constitutional: Pt appears well-developed and well-nourished.  HENT: Head: Normocephalic.  Right Ear: External ear normal.  Left Ear: External ear normal.  Eyes: Conjunctivae and EOM are normal. Pupils are equal, round, and reactive to light.  Neck: Normal range of motion. Neck supple.  Cardiovascular: Normal rate and regular rhythm.   Pulmonary/Chest: Effort normal and breath sounds normal.  Abd:  Soft, NT, non-distended, + BS Neurological: Pt is alert. No cranial nerve deficit.  Skin: Skin is warm. No erythema. few small bruises/contusion to arms Psychiatric: Pt behavior is normal. Thought content c/w mod dementia    Assessment & Plan:

## 2011-06-05 NOTE — Assessment & Plan Note (Signed)
stable overall by hx and exam, most recent data reviewed with pt, and pt to continue medical treatment as before Lab Results  Component Value Date   WBC 8.3 04/26/2010   HGB 14.8 04/26/2010   HCT 42.7 04/26/2010   PLT 242.0 04/26/2010   GLUCOSE 80 04/26/2010   CHOL 170 12/01/2010   TRIG 240.0* 12/01/2010   HDL 44.60 12/01/2010   LDLDIRECT 100.6 12/01/2010   LDLCALC 96 04/20/2009   ALT 16 04/26/2010   AST 20 04/26/2010   NA 138 04/26/2010   K 4.9 04/26/2010   CL 100 04/26/2010   CREATININE 1.2 04/26/2010   BUN 25* 04/26/2010   CO2 32 04/26/2010   TSH 5.22 04/26/2010   PSA 3.89 12/01/2010

## 2011-06-05 NOTE — Assessment & Plan Note (Signed)
stable overall by hx and exam, most recent data reviewed with pt, and pt to continue medical treatment as before  SpO2 Readings from Last 3 Encounters:  06/01/11 95%  04/13/11 97%  01/19/11 95%

## 2011-06-05 NOTE — Assessment & Plan Note (Signed)
Likely benign related to thin skin and bumps,  Doubt significant illness or med related, Continue all other medications as before, pt reassured

## 2011-06-05 NOTE — Assessment & Plan Note (Signed)
stable overall by hx and exam, most recent data reviewed with pt, and pt to continue medical treatment as before  BP Readings from Last 3 Encounters:  06/01/11 110/60  05/25/11 124/84  04/13/11 110/70

## 2011-06-17 ENCOUNTER — Encounter: Payer: Self-pay | Admitting: Internal Medicine

## 2011-06-17 ENCOUNTER — Ambulatory Visit (INDEPENDENT_AMBULATORY_CARE_PROVIDER_SITE_OTHER): Payer: Medicare Other | Admitting: Internal Medicine

## 2011-06-17 VITALS — BP 102/60 | HR 87 | Temp 97.4°F | Ht 72.0 in | Wt 206.0 lb

## 2011-06-17 DIAGNOSIS — J45909 Unspecified asthma, uncomplicated: Secondary | ICD-10-CM | POA: Diagnosis not present

## 2011-06-17 DIAGNOSIS — K219 Gastro-esophageal reflux disease without esophagitis: Secondary | ICD-10-CM | POA: Diagnosis not present

## 2011-06-17 DIAGNOSIS — I10 Essential (primary) hypertension: Secondary | ICD-10-CM | POA: Diagnosis not present

## 2011-06-17 MED ORDER — BUDESONIDE 180 MCG/ACT IN AEPB: 1.0000 | INHALATION_SPRAY | Freq: Two times a day (BID) | RESPIRATORY_TRACT | Status: DC

## 2011-06-17 MED ORDER — PREDNISONE 10 MG PO TABS: ORAL_TABLET | ORAL | Status: DC

## 2011-06-17 MED ORDER — METHYLPREDNISOLONE ACETATE PF 80 MG/ML IJ SUSP
120.0000 mg | Freq: Once | INTRAMUSCULAR | Status: AC
  Administered 2011-06-17: 120 mg via INTRAMUSCULAR

## 2011-06-17 NOTE — Patient Instructions (Signed)
You had the steroid shot today Take all new medications as prescribed - the prednisone, sent to your pharmacy Please try the Pulmicort Flexhaler sample at 1 puff twice per day; a prescriptoin was also sent to your pharmacy  Continue all other medications as before, except stop the Qvar

## 2011-06-18 ENCOUNTER — Encounter: Payer: Self-pay | Admitting: Internal Medicine

## 2011-06-18 NOTE — Assessment & Plan Note (Signed)
stable overall by hx and exam, most recent data reviewed with pt, and pt to continue medical treatment as before  BP Readings from Last 3 Encounters:  06/17/11 102/60  06/01/11 110/60  05/25/11 124/84

## 2011-06-18 NOTE — Assessment & Plan Note (Signed)
stable overall by hx and exam, most recent data reviewed with pt, and pt to continue medical treatment as before, doubt this as cause for voice change or RAD flare Lab Results  Component Value Date   WBC 8.3 04/26/2010   HGB 14.8 04/26/2010   HCT 42.7 04/26/2010   PLT 242.0 04/26/2010   GLUCOSE 80 04/26/2010   CHOL 170 12/01/2010   TRIG 240.0* 12/01/2010   HDL 44.60 12/01/2010   LDLDIRECT 100.6 12/01/2010   LDLCALC 96 04/20/2009   ALT 16 04/26/2010   AST 20 04/26/2010   NA 138 04/26/2010   K 4.9 04/26/2010   CL 100 04/26/2010   CREATININE 1.2 04/26/2010   BUN 25* 04/26/2010   CO2 32 04/26/2010   TSH 5.22 04/26/2010   PSA 3.89 12/01/2010

## 2011-06-18 NOTE — Assessment & Plan Note (Addendum)
Mild to mod worsening due to noncompliacne with tx, to d/c the qvar per pt reqeust, gave pt pulmicort flexhaler samples with written and verbal instruction on use, to f/u any worsening symptoms or concerns, for depomedrol IM, and predpack for flare today

## 2011-06-18 NOTE — Progress Notes (Signed)
Subjective:    Patient ID: Stephen Wilkins, male    DOB: 13-Oct-1927, 78 y.o.   MRN: [id number]  HPI  Here to f/u;  Mild dementia persits; does not remember most of prior visits for similar problem;  Has stopped using the Qvar in the past few wks as he felt it was causing voice change (since resolved) and does not want to re-start;  unfort also has recurrence of signficiant mild wheezing, sob/doe, cough as c/w previous episodes asthma uncontrolled;   Pt denies fever, wt loss, night sweats, loss of appetite, or other constitutional symptoms  Pt denies chest pain, orthopnea, PND, increased LE swelling, palpitations, dizziness or syncope.  Pt denies new neurological symptoms such as new headache, or facial or extremity weakness or numbness   Pt denies polydipsia, polyuria.  No other new complaints such as HA, fever, ST, sinus or ear symtpoms.  Denies worsening reflux, dysphagia, abd pain, n/v, bowel change or blood. Past Medical History  Diagnosis Date  . ABNORMAL ELECTROCARDIOGRAM 06/21/2007  . ALLERGIC RHINITIS 03/23/2007  . ASTHMATIC BRONCHITIS, ACUTE 04/27/2007  . BENIGN PROSTATIC HYPERTROPHY 03/23/2007  . BRONCHITIS NOT SPECIFIED AS ACUTE OR CHRONIC 04/21/2007  . BURSITIS, RIGHT HIP 07/31/2008  . Cough 01/29/2009  . ERECTILE DYSFUNCTION, ORGANIC 04/17/2009  . ERECTILE DYSFUNCTION 03/23/2007  . FREQUENCY, URINARY 04/26/2010  . GERD 03/23/2007  . HIATAL HERNIA   . HYPERLIPIDEMIA 03/23/2007  . HYPERTENSION 03/23/2007  . MILD COGNITIVE IMPAIRMENT SO STATED 05/19/2010  . NEPHROLITHIASIS, HX OF 03/23/2007  . PSA, INCREASED 06/27/2008  . RASH-NONVESICULAR 03/23/2007  . REACTIVE AIRWAY DISEASE 01/14/2010  . SCHATZKI'S RING   . SCIATICA, RIGHT 04/28/2008  . Wheezing 04/17/2009  . Dementia 09/01/2010   Past Surgical History  Procedure Date  . Tonsillectomy     reports that he has quit smoking. He has never used smokeless tobacco. He reports that he does not drink alcohol or use illicit drugs. family  history includes Cancer in his brother and unspecified family member; Emphysema in his brother; and Hypertension in an unspecified family member. Allergies  Allergen Reactions  . Atorvastatin     REACTION: myalgia  . Doxazosin Mesylate     REACTION: dizziness   Current Outpatient Prescriptions on File Prior to Visit  Medication Sig Dispense Refill  . albuterol (PROVENTIL HFA;VENTOLIN HFA) 108 (90 BASE) MCG/ACT inhaler Inhale 2 puffs into the lungs every 6 (six) hours as needed for wheezing.  1 Inhaler  11  . bisacodyl (DULCOLAX) 5 MG EC tablet Take 5 mg by mouth daily as needed.        . Cholecalciferol (VITAMIN D) 1000 UNITS capsule Take 1,000 Units by mouth daily.        . CRESTOR 20 MG tablet take 1 tablet by mouth once daily  30 tablet  5  . lansoprazole (PREVACID) 30 MG capsule TAKE 1 CAPSULE BY MOUTH ONCE DAILY  30 capsule  4  . Multiple Vitamins-Minerals (CENTRUM SILVER PO) Take by mouth daily.        . Omega-3 Fatty Acids (FISH OIL) 1200 MG CAPS Take by mouth daily.        . polyethylene glycol powder (MIRALAX) powder Take 1/2 capful by mouth once daily  1 g  0  . Tamsulosin HCl (FLOMAX) 0.4 MG CAPS Take by mouth 2 (two) times daily.        Marland Kitchen triamcinolone (KENALOG) 0.1 % cream Apply topically 2 (two) times daily.        . valsartan (  DIOVAN) 80 MG tablet Take 1 tablet (80 mg total) by mouth daily.  30 tablet  11   Review of Systems All otherwise neg per pt    Objective:   Physical Exam BP 102/60  Pulse 87  Temp(Src) 97.4 F (36.3 C) (Oral)  Ht 6' (1.829 m)  Wt 206 lb (93.441 kg)  BMI 27.94 kg/m2  SpO2 92% Physical Exam  VS noted, not ill appearing Constitutional: Pt appears well-developed and well-nourished.  HENT: Head: Normocephalic.  Right Ear: External ear normal.  Left Ear: External ear normal.  Eyes: Conjunctivae and EOM are normal. Pupils are equal, round, and reactive to light.  Neck: Normal range of motion. Neck supple.  Cardiovascular: Normal rate and  regular rhythm.   Pulmonary/Chest: Effort normal and breath sounds mild decreased with bilat wheeze, mild  Abd:  Soft, NT, non-distended, + BS Neurological: Pt is alert. No cranial nerve deficit.  Skin: Skin is warm. No erythema.    Assessment & Plan:

## 2011-07-13 ENCOUNTER — Other Ambulatory Visit: Payer: Self-pay | Admitting: Internal Medicine

## 2011-10-15 ENCOUNTER — Other Ambulatory Visit: Payer: Self-pay | Admitting: Internal Medicine

## 2011-11-30 ENCOUNTER — Encounter: Payer: Self-pay | Admitting: Internal Medicine

## 2011-11-30 ENCOUNTER — Ambulatory Visit (INDEPENDENT_AMBULATORY_CARE_PROVIDER_SITE_OTHER): Payer: Medicare Other | Admitting: Internal Medicine

## 2011-11-30 ENCOUNTER — Other Ambulatory Visit (INDEPENDENT_AMBULATORY_CARE_PROVIDER_SITE_OTHER): Payer: Medicare Other

## 2011-11-30 VITALS — BP 108/80 | HR 68 | Temp 97.2°F | Ht 72.0 in | Wt 204.0 lb

## 2011-11-30 DIAGNOSIS — N400 Enlarged prostate without lower urinary tract symptoms: Secondary | ICD-10-CM | POA: Diagnosis not present

## 2011-11-30 DIAGNOSIS — R972 Elevated prostate specific antigen [PSA]: Secondary | ICD-10-CM | POA: Diagnosis not present

## 2011-11-30 DIAGNOSIS — E785 Hyperlipidemia, unspecified: Secondary | ICD-10-CM

## 2011-11-30 DIAGNOSIS — I10 Essential (primary) hypertension: Secondary | ICD-10-CM

## 2011-11-30 DIAGNOSIS — J45909 Unspecified asthma, uncomplicated: Secondary | ICD-10-CM

## 2011-11-30 LAB — URINALYSIS, ROUTINE W REFLEX MICROSCOPIC
Specific Gravity, Urine: 1.02 (ref 1.000–1.030)
Total Protein, Urine: NEGATIVE
Urine Glucose: NEGATIVE
Urobilinogen, UA: 0.2 (ref 0.0–1.0)
pH: 6 (ref 5.0–8.0)

## 2011-11-30 LAB — LIPID PANEL
HDL: 42.2 mg/dL (ref >39.00)
Triglycerides: 171 mg/dL — ABNORMAL HIGH (ref 0.0–149.0)

## 2011-11-30 LAB — HEPATIC FUNCTION PANEL
Albumin: 3.9 g/dL (ref 3.5–5.2)
Bilirubin, Direct: 0.2 mg/dL (ref 0.0–0.3)
Total Protein: 6.3 g/dL (ref 6.0–8.3)

## 2011-11-30 LAB — CBC WITH DIFFERENTIAL/PLATELET
Basophils Relative: 0.5 % (ref 0.0–3.0)
Eosinophils Relative: 2.4 % (ref 0.0–5.0)
Hemoglobin: 13.7 g/dL (ref 13.0–17.0)
Lymphocytes Relative: 31.1 % (ref 12.0–46.0)
Monocytes Relative: 9.9 % (ref 3.0–12.0)
Neutro Abs: 3.3 10*3/uL (ref 1.4–7.7)
RBC: 4.37 Mil/uL (ref 4.22–5.81)

## 2011-11-30 LAB — PSA: PSA: 3.47 ng/mL (ref 0.10–4.00)

## 2011-11-30 LAB — BASIC METABOLIC PANEL
CO2: 31 mEq/L (ref 19–32)
Calcium: 9.7 mg/dL (ref 8.4–10.5)
Creatinine, Ser: 1.3 mg/dL (ref 0.4–1.5)
Glucose, Bld: 90 mg/dL (ref 70–99)

## 2011-11-30 MED ORDER — IRBESARTAN 75 MG PO TABS: 75.0000 mg | ORAL_TABLET | Freq: Every day | ORAL | Status: DC

## 2011-11-30 MED ORDER — BUDESONIDE 180 MCG/ACT IN AEPB: 1.0000 | INHALATION_SPRAY | Freq: Two times a day (BID) | RESPIRATORY_TRACT | Status: DC

## 2011-11-30 MED ORDER — FINASTERIDE 5 MG PO TABS: 5.0000 mg | ORAL_TABLET | Freq: Every day | ORAL | Status: AC

## 2011-11-30 NOTE — Assessment & Plan Note (Signed)
stable overall by hx and exam, most recent data reviewed with pt, and pt to continue medical treatment as before Lab Results  Component Value Date   LDLCALC 96 04/20/2009

## 2011-11-30 NOTE — Assessment & Plan Note (Addendum)
stable overall by hx and exam, most recent data reviewed with pt, and pt to continue medical treatment as before SpO2 Readings from Last 3 Encounters:  11/30/11 93%  06/17/11 92%  06/01/11 95%   Note:  Total time for pt hx, exam, review of record with pt in the room, determination of diagnoses and plan for further eval and tx is > 40 min, with over 50% spent in coordination and counseling of patient

## 2011-11-30 NOTE — Progress Notes (Signed)
Subjective:    Patient ID: Stephen Wilkins, male    DOB: 1927/08/10, 78 y.o.   MRN: [id number]  HPI Here for f/u;  Overall doing ok;  Pt denies CP, worsening SOB, DOE, wheezing, orthopnea, PND, worsening LE edema, palpitations, dizziness or syncope.  Pt denies neurological change such as new Headache, facial or extremity weakness.  Pt denies polydipsia, polyuria, or low sugar symptoms. Pt states overall good compliance with treatment and medications, good tolerability, and trying to follow lower cholesterol diet.  Pt denies worsening depressive symptoms, suicidal ideation or panic. No fever, wt loss, night sweats, loss of appetite, or other constitutional symptoms.  Pt states good ability with ADL's, low fall risk, home safety reviewed and adequate, no significant changes in hearing or vision, and occasionally active with exercise.  Has occasional hard BM, and asks for DRE today for heme check.  No worsening prostatism on bid flomax, but still weak stream with first urination in the AM, but wants to hold off on surgury, asks fo other such as proscar.  C/o cool feet at night and asks for circulation check.  Has some off balance on occasion to bend over to put the ball on the tee with golf.    Still using pulmicort but more or less prn with wheezing, working better than qvar; needs rx as works well Past Medical History  Diagnosis Date  . ABNORMAL ELECTROCARDIOGRAM 06/21/2007  . ALLERGIC RHINITIS 03/23/2007  . ASTHMATIC BRONCHITIS, ACUTE 04/27/2007  . BENIGN PROSTATIC HYPERTROPHY 03/23/2007  . BRONCHITIS NOT SPECIFIED AS ACUTE OR CHRONIC 04/21/2007  . BURSITIS, RIGHT HIP 07/31/2008  . Cough 01/29/2009  . ERECTILE DYSFUNCTION, ORGANIC 04/17/2009  . ERECTILE DYSFUNCTION 03/23/2007  . FREQUENCY, URINARY 04/26/2010  . GERD 03/23/2007  . HIATAL HERNIA   . HYPERLIPIDEMIA 03/23/2007  . HYPERTENSION 03/23/2007  . MILD COGNITIVE IMPAIRMENT SO STATED 05/19/2010  . NEPHROLITHIASIS, HX OF 03/23/2007  . PSA, INCREASED  06/27/2008  . RASH-NONVESICULAR 03/23/2007  . REACTIVE AIRWAY DISEASE 01/14/2010  . SCHATZKI'S RING   . SCIATICA, RIGHT 04/28/2008  . Wheezing 04/17/2009  . Dementia 09/01/2010   Past Surgical History  Procedure Date  . Tonsillectomy     reports that he has quit smoking. He has never used smokeless tobacco. He reports that he does not drink alcohol or use illicit drugs. family history includes Cancer in his brother and unspecified family member; Emphysema in his brother; and Hypertension in an unspecified family member. Allergies  Allergen Reactions  . Atorvastatin     REACTION: myalgia  . Doxazosin Mesylate     REACTION: dizziness   Current Outpatient Prescriptions on File Prior to Visit  Medication Sig Dispense Refill  . albuterol (PROVENTIL HFA;VENTOLIN HFA) 108 (90 BASE) MCG/ACT inhaler Inhale 2 puffs into the lungs every 6 (six) hours as needed for wheezing.  1 Inhaler  11  . bisacodyl (DULCOLAX) 5 MG EC tablet Take 5 mg by mouth daily as needed.        . Cholecalciferol (VITAMIN D) 1000 UNITS capsule Take 1,000 Units by mouth daily.        . CRESTOR 20 MG tablet take 1 tablet by mouth once daily  30 tablet  5  . lansoprazole (PREVACID) 30 MG capsule TAKE 1 CAPSULE BY MOUTH ONCE DAILY  30 capsule  4  . Multiple Vitamins-Minerals (CENTRUM SILVER PO) Take by mouth daily.        . Omega-3 Fatty Acids (FISH OIL) 1200 MG CAPS Take by mouth daily.        Marland Kitchen  polyethylene glycol powder (MIRALAX) powder Take 1/2 capful by mouth once daily  1 g  0  . Tamsulosin HCl (FLOMAX) 0.4 MG CAPS take 1 capsule by mouth twice a day  180 capsule  3  . triamcinolone (KENALOG) 0.1 % cream Apply topically 2 (two) times daily.        Marland Kitchen DISCONTD: budesonide (PULMICORT FLEXHALER) 180 MCG/ACT inhaler Inhale 1 puff into the lungs 2 (two) times daily.  1 Inhaler  5  . irbesartan (AVAPRO) 75 MG tablet Take 1 tablet (75 mg total) by mouth daily.  90 tablet  3  . DISCONTD: beclomethasone (QVAR) 80 MCG/ACT inhaler  Inhale 2 puffs into the lungs 2 (two) times daily. Rinse mouth after  1 Inhaler  11   Review of Systems Review of Systems  Constitutional: Negative for diaphoresis and unexpected weight change.  HENT: Negative for drooling and tinnitus.   Eyes: Negative for photophobia and visual disturbance.  Respiratory: Negative for choking and stridor.   Gastrointestinal: Negative for vomiting and blood in stool.  Genitourinary: Negative for hematuria and decreased urine volume.  Musculoskeletal: Negative for gait problem.  Skin: Negative for color change and wound.  Neurological: Negative for tremors and numbness.  Psychiatric/Behavioral: Negative for decreased concentration. The patient is not hyperactive.      Objective:   Physical Exam BP 108/80  Pulse 68  Temp 97.2 F (36.2 C) (Oral)  Ht 6' (1.829 m)  Wt 204 lb (92.534 kg)  BMI 27.67 kg/m2  SpO2 93% Physical Exam  VS noted Constitutional: Pt appears well-developed and well-nourished. Annabell Sabal HENT: Head: Normocephalic.  Right Ear: External ear normal.  Left Ear: External ear normal.  Eyes: Conjunctivae and EOM are normal. Pupils are equal, round, and reactive to light.  Neck: Normal range of motion. Neck supple.  Cardiovascular: Normal rate and regular rhythm.   Pulmonary/Chest: Effort normal and breath sounds mild decreased but no wheeze, rales Abd:  Soft, NT, non-distended, + BS Neurological: Pt is alert. No cranial nerve deficit. Motor/dtr intect, has mild decr sens to toes bilat Skin: Skin is warm. No erythema. No rash, no LE edema Psychiatric: Pt behavior is normal. Thought content c/w with mild to mod dementia, formal MMSE not done DRE:  Normal tone, prostate 1+, no nodule, NT, no rectal mass, hemoccult neg    Assessment & Plan:

## 2011-11-30 NOTE — Assessment & Plan Note (Addendum)
Pt requests f/u PSA, though we did discuss the risk of finding and increased PSA that might suggest prostate cancer, and f/u with urology for this purpose at his age

## 2011-11-30 NOTE — Patient Instructions (Addendum)
Please start the generic for Proscar (finasteride) as this can help for enlarged prostate on your exam today (remember it takes up to 6 months to really work well) Please call if you would like referral to urology in the meantime OK to stop the Diovan since the cost is so high Please start the generic for Avapro 75 mg per day, as this is comparable in effectiveness You are given the refill of the Pulmicort today There was no blood found on the prostate/rectal exam today Continue all other medications as before Please have the pharmacy call with any other refills you may need. Please continue your efforts at being more active, low cholesterol diet, and weight control. Please go to LAB in the Basement for the blood and/or urine tests to be done today You will be contacted by phone if any changes need to be made immediately.  Otherwise, you will receive a letter about your results with an explanation. Please return in 6 months, or sooner if needed

## 2011-11-30 NOTE — Assessment & Plan Note (Signed)
Mild symtpomatic despite bid flomax, declines urology at this time, to add proscar 5 qd

## 2011-11-30 NOTE — Assessment & Plan Note (Addendum)
stable overall by hx and exam, most recent data reviewed with pt, and pt to continue medical treatment as before except to change the diovan to avapro due to cost . BP Readings from Last 3 Encounters:  11/30/11 108/80  06/17/11 102/60  06/01/11 110/60

## 2012-01-09 ENCOUNTER — Ambulatory Visit (INDEPENDENT_AMBULATORY_CARE_PROVIDER_SITE_OTHER): Payer: Medicare Other | Admitting: Internal Medicine

## 2012-01-09 ENCOUNTER — Encounter: Payer: Self-pay | Admitting: Internal Medicine

## 2012-01-09 ENCOUNTER — Other Ambulatory Visit (INDEPENDENT_AMBULATORY_CARE_PROVIDER_SITE_OTHER): Payer: Medicare Other

## 2012-01-09 VITALS — BP 100/60 | HR 86 | Temp 97.3°F | Ht 72.0 in | Wt 202.5 lb

## 2012-01-09 DIAGNOSIS — I10 Essential (primary) hypertension: Secondary | ICD-10-CM

## 2012-01-09 DIAGNOSIS — F0390 Unspecified dementia without behavioral disturbance: Secondary | ICD-10-CM | POA: Diagnosis not present

## 2012-01-09 DIAGNOSIS — R310 Gross hematuria: Secondary | ICD-10-CM | POA: Diagnosis not present

## 2012-01-09 DIAGNOSIS — Z23 Encounter for immunization: Secondary | ICD-10-CM | POA: Diagnosis not present

## 2012-01-09 LAB — URINALYSIS, ROUTINE W REFLEX MICROSCOPIC
Hgb urine dipstick: NEGATIVE
Ketones, ur: NEGATIVE
Urine Glucose: NEGATIVE
Urobilinogen, UA: 0.2 (ref 0.0–1.0)

## 2012-01-09 MED ORDER — CIPROFLOXACIN HCL 500 MG PO TABS: 500.0000 mg | ORAL_TABLET | Freq: Two times a day (BID) | ORAL | Status: DC

## 2012-01-09 NOTE — Patient Instructions (Addendum)
You had the flu shot today You appear to have either food poisoning or a "stomach flu" recently Please go to LAB in the Basement for urine tests to be done today Take all new medications as prescribed  - the antibiotic You will be contacted regarding the referral for: Urology Continue all other medications as before

## 2012-01-09 NOTE — Assessment & Plan Note (Addendum)
Despite his dementia, I feel confident he is describing gross hematuria, painless, but with ? Chills;  For urine studies, cipro course, and refer urology - will need r/o malignancy as well

## 2012-01-09 NOTE — Progress Notes (Signed)
Subjective:    Patient ID: Stephen Wilkins, male    DOB: 02-Mar-1928, 78 y.o.   MRN: [id number]  HPI  Here with acute onset gross hematuria, painless without f/c, n/v, flank pain, and  Denies urinary symptoms such as dysuria, frequency, urgency.  He is confident it does not represent BRBPR and Denies worsening reflux, dysphagia, abd pain, n/v, bowel change or blood.  Did visit [place] last wk with an episodes of about 48 hrs n/v, gi upset without fever, or blood. Pt denies chest pain, increased sob or doe, wheezing, orthopnea, PND, increased LE swelling, palpitations, dizziness or syncope.  Pt denies new neurological symptoms such as new headache, or facial or extremity weakness or numbness   Pt denies polydipsia, polyuria.  Overall good compliance with treatment, and good medicine tolerability.  Dementia overall stable symptomatically and not assoc with behavioral changes such as hallucinations, paranoia, or agitation. Past Medical History  Diagnosis Date  . ABNORMAL ELECTROCARDIOGRAM 06/21/2007  . ALLERGIC RHINITIS 03/23/2007  . ASTHMATIC BRONCHITIS, ACUTE 04/27/2007  . BENIGN PROSTATIC HYPERTROPHY 03/23/2007  . BRONCHITIS NOT SPECIFIED AS ACUTE OR CHRONIC 04/21/2007  . BURSITIS, RIGHT HIP 07/31/2008  . Cough 01/29/2009  . ERECTILE DYSFUNCTION, ORGANIC 04/17/2009  . ERECTILE DYSFUNCTION 03/23/2007  . FREQUENCY, URINARY 04/26/2010  . GERD 03/23/2007  . HIATAL HERNIA   . HYPERLIPIDEMIA 03/23/2007  . HYPERTENSION 03/23/2007  . MILD COGNITIVE IMPAIRMENT SO STATED 05/19/2010  . NEPHROLITHIASIS, HX OF 03/23/2007  . PSA, INCREASED 06/27/2008  . RASH-NONVESICULAR 03/23/2007  . REACTIVE AIRWAY DISEASE 01/14/2010  . SCHATZKI'S RING   . SCIATICA, RIGHT 04/28/2008  . Wheezing 04/17/2009  . Dementia 09/01/2010   Past Surgical History  Procedure Date  . Tonsillectomy     reports that he has quit smoking. He has never used smokeless tobacco. He reports that he does not drink alcohol or use illicit  drugs. family history includes Cancer in his brother and unspecified family member; Emphysema in his brother; and Hypertension in an unspecified family member. Allergies  Allergen Reactions  . Atorvastatin     REACTION: myalgia  . Doxazosin Mesylate     REACTION: dizziness   Current Outpatient Prescriptions on File Prior to Visit  Medication Sig Dispense Refill  . albuterol (PROVENTIL HFA;VENTOLIN HFA) 108 (90 BASE) MCG/ACT inhaler Inhale 2 puffs into the lungs every 6 (six) hours as needed for wheezing.  1 Inhaler  11  . bisacodyl (DULCOLAX) 5 MG EC tablet Take 5 mg by mouth daily as needed.        . budesonide (PULMICORT FLEXHALER) 180 MCG/ACT inhaler Inhale 1 puff into the lungs 2 (two) times daily.  1 Inhaler  5  . Cholecalciferol (VITAMIN D) 1000 UNITS capsule Take 1,000 Units by mouth daily.        . CRESTOR 20 MG tablet take 1 tablet by mouth once daily  30 tablet  5  . finasteride (PROSCAR) 5 MG tablet Take 1 tablet (5 mg total) by mouth daily.  90 tablet  3  . irbesartan (AVAPRO) 75 MG tablet Take 1 tablet (75 mg total) by mouth daily.  90 tablet  3  . lansoprazole (PREVACID) 30 MG capsule TAKE 1 CAPSULE BY MOUTH ONCE DAILY  30 capsule  4  . Multiple Vitamins-Minerals (CENTRUM SILVER PO) Take by mouth daily.        . Omega-3 Fatty Acids (FISH OIL) 1200 MG CAPS Take by mouth daily.        . polyethylene glycol powder (MIRALAX)  powder Take 1/2 capful by mouth once daily  1 g  0  . Tamsulosin HCl (FLOMAX) 0.4 MG CAPS take 1 capsule by mouth twice a day  180 capsule  3  . triamcinolone (KENALOG) 0.1 % cream Apply topically 2 (two) times daily.        Marland Kitchen DISCONTD: beclomethasone (QVAR) 80 MCG/ACT inhaler Inhale 2 puffs into the lungs 2 (two) times daily. Rinse mouth after  1 Inhaler  11  ' Review of Systems  Constitutional: Negative for diaphoresis and unexpected weight change.  HENT: Negative for tinnitus.   Eyes: Negative for photophobia and visual disturbance.  Respiratory:  Negative for choking and stridor.   Gastrointestinal: Negative for vomiting and blood in stool.  Genitourinary: Negative for decreased urine volume.  Musculoskeletal: Negative for gait problem.  Skin: Negative for color change and wound.  Neurological: Negative for tremors and numbness.  Psychiatric/Behavioral: Negative for decreased concentration. The patient is not hyperactive.       Objective:   Physical Exam BP 100/60  Pulse 86  Temp 97.3 F (36.3 C) (Oral)  Ht 6' (1.829 m)  Wt 202 lb 8 oz (91.853 kg)  BMI 27.46 kg/m2  SpO2 95% Physical Exam  VS noted Constitutional: Pt appears well-developed and well-nourished.  HENT: Head: Normocephalic.  Right Ear: External ear normal.  Left Ear: External ear normal.  Eyes: Conjunctivae and EOM are normal. Pupils are equal, round, and reactive to light.  Neck: Normal range of motion. Neck supple.  Cardiovascular: Normal rate and regular rhythm.   Pulmonary/Chest: Effort normal and breath sounds normal.  Abd:  Soft, NT, non-distended, + BS, no flank tender Neurological: Pt is alert. Not confused  Skin: Skin is warm. No erythema.  Psychiatric: Pt behavior is normal. Mild nervous     Assessment & Plan:

## 2012-01-10 LAB — URINE CULTURE
Colony Count: NO GROWTH
Organism ID, Bacteria: NO GROWTH

## 2012-01-15 ENCOUNTER — Encounter: Payer: Self-pay | Admitting: Internal Medicine

## 2012-01-15 NOTE — Assessment & Plan Note (Signed)
stable overall by hx and exam, most recent data reviewed with pt, and pt to continue medical treatment as before BP Readings from Last 3 Encounters:  01/09/12 100/60  11/30/11 108/80  06/17/11 102/60

## 2012-01-15 NOTE — Assessment & Plan Note (Signed)
stable overall by hx and exam, most recent data reviewed with pt, and pt to continue medical treatment as before Lab Results  Component Value Date   WBC 5.9 11/30/2011   HGB 13.7 11/30/2011   HCT 41.5 11/30/2011   PLT 214.0 11/30/2011   GLUCOSE 90 11/30/2011   CHOL 178 11/30/2011   TRIG 171.0* 11/30/2011   HDL 42.20 11/30/2011   LDLDIRECT 100.6 12/01/2010   LDLCALC 102* 11/30/2011   ALT 15 11/30/2011   AST 22 11/30/2011   NA 140 11/30/2011   K 4.6 11/30/2011   CL 103 11/30/2011   CREATININE 1.3 11/30/2011   BUN 23 11/30/2011   CO2 31 11/30/2011   TSH 7.04* 11/30/2011   PSA 3.47 11/30/2011

## 2012-02-10 DIAGNOSIS — N401 Enlarged prostate with lower urinary tract symptoms: Secondary | ICD-10-CM | POA: Diagnosis not present

## 2012-02-10 DIAGNOSIS — R310 Gross hematuria: Secondary | ICD-10-CM | POA: Diagnosis not present

## 2012-02-15 DIAGNOSIS — N210 Calculus in bladder: Secondary | ICD-10-CM | POA: Diagnosis not present

## 2012-02-15 DIAGNOSIS — N200 Calculus of kidney: Secondary | ICD-10-CM | POA: Diagnosis not present

## 2012-02-15 DIAGNOSIS — R310 Gross hematuria: Secondary | ICD-10-CM | POA: Diagnosis not present

## 2012-02-20 DIAGNOSIS — R310 Gross hematuria: Secondary | ICD-10-CM | POA: Diagnosis not present

## 2012-02-20 DIAGNOSIS — N200 Calculus of kidney: Secondary | ICD-10-CM | POA: Diagnosis not present

## 2012-03-14 ENCOUNTER — Other Ambulatory Visit: Payer: Self-pay

## 2012-03-14 DIAGNOSIS — J45909 Unspecified asthma, uncomplicated: Secondary | ICD-10-CM

## 2012-03-14 MED ORDER — BUDESONIDE 180 MCG/ACT IN AEPB: 1.0000 | INHALATION_SPRAY | Freq: Two times a day (BID) | RESPIRATORY_TRACT | Status: DC

## 2012-03-16 ENCOUNTER — Telehealth: Payer: Self-pay | Admitting: *Deleted

## 2012-03-16 ENCOUNTER — Telehealth: Payer: Self-pay

## 2012-03-16 MED ORDER — BECLOMETHASONE DIPROPIONATE 80 MCG/ACT IN AERS: 1.0000 | INHALATION_SPRAY | Freq: Two times a day (BID) | RESPIRATORY_TRACT | Status: DC

## 2012-03-16 NOTE — Telephone Encounter (Signed)
Patients insurance will not cover budesonide (pulmicort) inhaler medication is $200 please advise on alternative

## 2012-03-16 NOTE — Telephone Encounter (Signed)
Ok to change to qvar - done erx

## 2012-03-16 NOTE — Telephone Encounter (Signed)
Notified pharmacy md change to qvar sent e-script,,,/lmb

## 2012-03-16 NOTE — Telephone Encounter (Signed)
Patient informed. 

## 2012-03-16 NOTE — Telephone Encounter (Signed)
Please ask pt to ask pharmacy - is there something similar that would be covered?

## 2012-03-16 NOTE — Telephone Encounter (Signed)
Stephen Wilkins left msg on triage stating md wanted to know what inhaler would be covered since pulmicort was too expensive. Qvar, Flovent, and asmanex all have $ 42 copay...Johny Chess

## 2012-06-04 ENCOUNTER — Encounter: Payer: Self-pay | Admitting: Internal Medicine

## 2012-06-04 ENCOUNTER — Other Ambulatory Visit (INDEPENDENT_AMBULATORY_CARE_PROVIDER_SITE_OTHER): Payer: Medicare Other

## 2012-06-04 ENCOUNTER — Ambulatory Visit (INDEPENDENT_AMBULATORY_CARE_PROVIDER_SITE_OTHER): Payer: Medicare Other | Admitting: Internal Medicine

## 2012-06-04 VITALS — BP 108/72 | HR 81 | Temp 97.5°F | Resp 16 | Wt 208.2 lb

## 2012-06-04 DIAGNOSIS — R6889 Other general symptoms and signs: Secondary | ICD-10-CM

## 2012-06-04 DIAGNOSIS — E039 Hypothyroidism, unspecified: Secondary | ICD-10-CM

## 2012-06-04 DIAGNOSIS — R310 Gross hematuria: Secondary | ICD-10-CM

## 2012-06-04 DIAGNOSIS — E785 Hyperlipidemia, unspecified: Secondary | ICD-10-CM | POA: Diagnosis not present

## 2012-06-04 DIAGNOSIS — I10 Essential (primary) hypertension: Secondary | ICD-10-CM

## 2012-06-04 DIAGNOSIS — R7989 Other specified abnormal findings of blood chemistry: Secondary | ICD-10-CM

## 2012-06-04 DIAGNOSIS — F0390 Unspecified dementia without behavioral disturbance: Secondary | ICD-10-CM

## 2012-06-04 DIAGNOSIS — J45909 Unspecified asthma, uncomplicated: Secondary | ICD-10-CM

## 2012-06-04 LAB — URINALYSIS, ROUTINE W REFLEX MICROSCOPIC
Bilirubin Urine: NEGATIVE
Hgb urine dipstick: NEGATIVE
Leukocytes, UA: NEGATIVE
Nitrite: NEGATIVE
pH: 6 (ref 5.0–8.0)

## 2012-06-04 LAB — TSH: TSH: 6.79 u[IU]/mL — ABNORMAL HIGH (ref 0.35–5.50)

## 2012-06-04 LAB — LIPID PANEL
LDL Cholesterol: 113 mg/dL — ABNORMAL HIGH (ref 0–99)
Total CHOL/HDL Ratio: 5

## 2012-06-04 MED ORDER — ALPRAZOLAM 0.25 MG PO TABS: 0.2500 mg | ORAL_TABLET | Freq: Every day | ORAL | Status: DC | PRN

## 2012-06-04 MED ORDER — IRBESARTAN 75 MG PO TABS: 75.0000 mg | ORAL_TABLET | Freq: Every day | ORAL | Status: DC

## 2012-06-04 MED ORDER — DESOXIMETASONE 0.25 % EX CREA: TOPICAL_CREAM | Freq: Two times a day (BID) | CUTANEOUS | Status: DC

## 2012-06-04 NOTE — Patient Instructions (Addendum)
You are given the pulmicort samples today since this seems to work better for you than the QVAR, but please use the Qvar when the samples run out OK to stop the diovan when your current supply is gone Then start the generic Avapro 75 mg per day, as this is likely to be less expensive Please continue all other medications as before, and refills have been done if requested (such as the xanax) Please go to the LAB in the Basement (turn left off the elevator) for the tests to be done today - the thyroid, cholesterol, and urine tests You will be contacted by phone if any changes need to be made immediately.  Otherwise, you will receive a letter about your results with an explanation, but please check with MyChart first. Thank you for enrolling in [facility]. Please follow the instructions below to securely access your online medical record. MyChart allows you to send messages to your doctor, view your test results, renew your prescriptions, schedule appointments, and more. Please return in 6 months, or sooner if needed

## 2012-06-04 NOTE — Assessment & Plan Note (Signed)
stable overall by history and exam, recent data reviewed with pt, and pt to continue medical treatment as before,  to f/u any worsening symptoms or concerns BP Readings from Last 3 Encounters:  06/04/12 108/72  01/09/12 100/60  11/30/11 108/80

## 2012-06-04 NOTE — Assessment & Plan Note (Signed)
stable overall by history and exam, recent data reviewed with pt, and pt to continue medical treatment as before,  to f/u any worsening symptoms or concerns Lab Results  Component Value Date   WBC 5.9 11/30/2011   HGB 13.7 11/30/2011   HCT 41.5 11/30/2011   PLT 214.0 11/30/2011   GLUCOSE 90 11/30/2011   CHOL 178 11/30/2011   TRIG 171.0* 11/30/2011   HDL 42.20 11/30/2011   LDLDIRECT 100.6 12/01/2010   LDLCALC 102* 11/30/2011   ALT 15 11/30/2011   AST 22 11/30/2011   NA 140 11/30/2011   K 4.6 11/30/2011   CL 103 11/30/2011   CREATININE 1.3 11/30/2011   BUN 23 11/30/2011   CO2 31 11/30/2011   TSH 7.04* 11/30/2011   PSA 3.47 11/30/2011

## 2012-06-04 NOTE — Assessment & Plan Note (Signed)
?   Spurious vs new hypothyroid - for f/u lab today  Lab Results  Component Value Date   TSH 7.04* 11/30/2011

## 2012-06-04 NOTE — Progress Notes (Signed)
Subjective:    Patient ID: Stephen Wilkins, male    DOB: [birth year], 79 y.o.   MRN: [id number]  HPI  Here to f/u; overall doing ok,  Pt denies chest pain, increased sob or doe, wheezing, orthopnea, PND, increased LE swelling, palpitations, dizziness or syncope, and old pulmicort sample inhaler really tends to work well, though this was about a yr ago and has been asked to take QVAR since then. . Asks for more sample if possible. Not using the Qvar regularly most likely.  Has ongoing dementia but Dementia overall stable symptomatically, and not assoc with behavioral changes such as hallucinations, paranoia, or agitation.  Pt denies polydipsia, polyuria, or low sugar symptoms such as weakness or confusion improved with po intake.  Pt denies new neurological symptoms such as new headache, or facial or extremity weakness or numbness.   Pt states overall good compliance with meds, has been trying to follow lower cholesterol diet, with wt overall stable,  but little exercise however. Has some confusion about meds again, still taking diovan 80 even though we tried to change to avapro due to cost.  Did see urology with hematuria thought due to renal stones.  Denies worsening depressive symptoms, suicidal ideation, or panic; has ongoing anxiety, and asks for xanax refill, does not seem to make him more confused.  Does mention urine with ? Worsening odor - but Denies urinary symptoms such as dysuria, frequency, urgency, flank pain, hematuria or n/v, fever, chills.  Last saw urology nov 2013.  Did have mild elev TSH and LDL approx 6 mo ago. Overall good compliance with treatment, and good medicine tolerability Past Medical History  Diagnosis Date  . ABNORMAL ELECTROCARDIOGRAM 06/21/2007  . ALLERGIC RHINITIS 03/23/2007  . ASTHMATIC BRONCHITIS, ACUTE 04/27/2007  . BENIGN PROSTATIC HYPERTROPHY 03/23/2007  . BRONCHITIS NOT SPECIFIED AS ACUTE OR CHRONIC 04/21/2007  . BURSITIS, RIGHT HIP 07/31/2008  . Cough 01/29/2009  .  ERECTILE DYSFUNCTION, ORGANIC 04/17/2009  . ERECTILE DYSFUNCTION 03/23/2007  . FREQUENCY, URINARY 04/26/2010  . GERD 03/23/2007  . HIATAL HERNIA   . HYPERLIPIDEMIA 03/23/2007  . HYPERTENSION 03/23/2007  . MILD COGNITIVE IMPAIRMENT SO STATED 05/19/2010  . NEPHROLITHIASIS, HX OF 03/23/2007  . PSA, INCREASED 06/27/2008  . RASH-NONVESICULAR 03/23/2007  . REACTIVE AIRWAY DISEASE 01/14/2010  . SCHATZKI'S RING   . SCIATICA, RIGHT 04/28/2008  . Wheezing 04/17/2009  . Dementia 09/01/2010   Past Surgical History  Procedure Laterality Date  . Tonsillectomy      reports that he has quit smoking. He has never used smokeless tobacco. He reports that he does not drink alcohol or use illicit drugs. family history includes Cancer in his brother and unspecified family member; Emphysema in his brother; and Hypertension in an unspecified family member. Allergies  Allergen Reactions  . Atorvastatin     REACTION: myalgia  . Doxazosin Mesylate     REACTION: dizziness   Current Outpatient Prescriptions on File Prior to Visit  Medication Sig Dispense Refill  . bisacodyl (DULCOLAX) 5 MG EC tablet Take 5 mg by mouth daily as needed.        . Cholecalciferol (VITAMIN D) 1000 UNITS capsule Take 1,000 Units by mouth daily.        . CRESTOR 20 MG tablet take 1 tablet by mouth once daily  30 tablet  5  . lansoprazole (PREVACID) 30 MG capsule TAKE 1 CAPSULE BY MOUTH ONCE DAILY  30 capsule  4  . Multiple Vitamins-Minerals (CENTRUM SILVER PO) Take by mouth daily.        Marland Kitchen  Omega-3 Fatty Acids (FISH OIL) 1200 MG CAPS Take by mouth daily.        . polyethylene glycol powder (MIRALAX) powder Take 1/2 capful by mouth once daily  1 g  0  . Tamsulosin HCl (FLOMAX) 0.4 MG CAPS take 1 capsule by mouth twice a day  180 capsule  3  . albuterol (PROVENTIL HFA;VENTOLIN HFA) 108 (90 BASE) MCG/ACT inhaler Inhale 2 puffs into the lungs every 6 (six) hours as needed for wheezing.  1 Inhaler  11  . beclomethasone (QVAR) 80 MCG/ACT  inhaler Inhale 1 puff into the lungs 2 (two) times daily.  1 Inhaler  12  . finasteride (PROSCAR) 5 MG tablet Take 1 tablet (5 mg total) by mouth daily.  90 tablet  3   No current facility-administered medications on file prior to visit.   Review of Systems  Constitutional: Negative for unexpected weight change, or unusual diaphoresis  HENT: Negative for tinnitus.   Eyes: Negative for photophobia and visual disturbance.  Respiratory: Negative for choking and stridor.   Gastrointestinal: Negative for vomiting and blood in stool.  Genitourinary: Negative for hematuria and decreased urine volume.  Musculoskeletal: Negative for acute joint swelling Skin: Negative for color change and wound.  Neurological: Negative for tremors and numbness other than noted  Psychiatric/Behavioral: Negative for decreased concentration or  hyperactivity.       Objective:   Physical Exam BP 108/72  Pulse 81  Temp(Src) 97.5 F (36.4 C) (Oral)  Resp 16  Wt 208 lb 4 oz (94.462 kg)  BMI 28.24 kg/m2  SpO2 97% VS noted,  Constitutional: Pt appears well-developed and well-nourished.  HENT: Head: NCAT.  Right Ear: External ear normal.  Left Ear: External ear normal.  Eyes: Conjunctivae and EOM are normal. Pupils are equal, round, and reactive to light.  Neck: Normal range of motion. Neck supple.  Cardiovascular: Normal rate and regular rhythm.   Pulmonary/Chest: Effort normal and breath sounds normal.  Abd:  Soft, NT, non-distended, + BS Neurological: Pt is alert. Appears at baseline confusion, motor/dtr intact Skin: Skin is warm. No erythema.  Psychiatric: Pt behavior is normal. Thought content c/w dementia (formal test not done)    Assessment & Plan:

## 2012-06-04 NOTE — Assessment & Plan Note (Addendum)
I gave further samples of pulmicort which does work better for him than qvar per pt, but is too expensive for ongoing rx, o/w stable overall by history and exam, recent data reviewed with pt, and pt to continue medical treatment as before,  to f/u any worsening symptoms or concerns SpO2 Readings from Last 3 Encounters:  06/04/12 97%  01/09/12 95%  11/30/11 93%  Note:  Total time for pt hx, exam, review of record with pt in the room, determination of diagnoses and plan for further eval and tx is > 40 min, with over 50% spent in coordination and counseling of patient

## 2012-06-04 NOTE — Assessment & Plan Note (Signed)
stable overall by history and exam, recent data reviewed with pt, and pt to continue medical treatment as before,  to f/u any worsening symptoms or concerns Lab Results  Component Value Date   LDLCALC 102* 11/30/2011

## 2012-06-04 NOTE — Assessment & Plan Note (Signed)
With recent urology eval, apparently related to renal stones,  to f/u any worsening symptoms or concerns

## 2012-08-17 DIAGNOSIS — H2510 Age-related nuclear cataract, unspecified eye: Secondary | ICD-10-CM | POA: Diagnosis not present

## 2012-08-20 DIAGNOSIS — N200 Calculus of kidney: Secondary | ICD-10-CM | POA: Diagnosis not present

## 2012-08-20 DIAGNOSIS — N401 Enlarged prostate with lower urinary tract symptoms: Secondary | ICD-10-CM | POA: Diagnosis not present

## 2012-08-20 DIAGNOSIS — N529 Male erectile dysfunction, unspecified: Secondary | ICD-10-CM | POA: Diagnosis not present

## 2012-10-06 ENCOUNTER — Other Ambulatory Visit: Payer: Self-pay | Admitting: Internal Medicine

## 2012-10-30 ENCOUNTER — Other Ambulatory Visit: Payer: Self-pay | Admitting: Internal Medicine

## 2012-12-04 ENCOUNTER — Other Ambulatory Visit: Payer: Self-pay | Admitting: Internal Medicine

## 2012-12-05 ENCOUNTER — Other Ambulatory Visit (INDEPENDENT_AMBULATORY_CARE_PROVIDER_SITE_OTHER): Payer: Medicare Other

## 2012-12-05 ENCOUNTER — Ambulatory Visit (INDEPENDENT_AMBULATORY_CARE_PROVIDER_SITE_OTHER): Payer: Medicare Other | Admitting: Internal Medicine

## 2012-12-05 ENCOUNTER — Encounter: Payer: Self-pay | Admitting: Internal Medicine

## 2012-12-05 VITALS — BP 112/72 | HR 75 | Temp 98.0°F | Ht 72.0 in | Wt 207.0 lb

## 2012-12-05 DIAGNOSIS — E785 Hyperlipidemia, unspecified: Secondary | ICD-10-CM | POA: Diagnosis not present

## 2012-12-05 DIAGNOSIS — R6889 Other general symptoms and signs: Secondary | ICD-10-CM

## 2012-12-05 DIAGNOSIS — J45909 Unspecified asthma, uncomplicated: Secondary | ICD-10-CM

## 2012-12-05 DIAGNOSIS — J45901 Unspecified asthma with (acute) exacerbation: Secondary | ICD-10-CM | POA: Insufficient documentation

## 2012-12-05 DIAGNOSIS — I10 Essential (primary) hypertension: Secondary | ICD-10-CM

## 2012-12-05 DIAGNOSIS — R7989 Other specified abnormal findings of blood chemistry: Secondary | ICD-10-CM

## 2012-12-05 DIAGNOSIS — Z23 Encounter for immunization: Secondary | ICD-10-CM

## 2012-12-05 LAB — HEPATIC FUNCTION PANEL
ALT: 17 U/L (ref 0–53)
AST: 21 U/L (ref 0–37)
Albumin: 4.1 g/dL (ref 3.5–5.2)
Alkaline Phosphatase: 50 U/L (ref 39–117)
Total Protein: 6.8 g/dL (ref 6.0–8.3)

## 2012-12-05 LAB — CBC WITH DIFFERENTIAL/PLATELET
Basophils Absolute: 0.1 10*3/uL (ref 0.0–0.1)
HCT: 40.2 % (ref 39.0–52.0)
Lymphocytes Relative: 32.6 % (ref 12.0–46.0)
Lymphs Abs: 2.3 10*3/uL (ref 0.7–4.0)
Monocytes Relative: 9 % (ref 3.0–12.0)
Neutrophils Relative %: 54.6 % (ref 43.0–77.0)
Platelets: 222 10*3/uL (ref 150.0–400.0)
RDW: 12.3 % (ref 11.5–14.6)
WBC: 7 10*3/uL (ref 4.5–10.5)

## 2012-12-05 LAB — TSH: TSH: 7.79 u[IU]/mL — ABNORMAL HIGH (ref 0.35–5.50)

## 2012-12-05 LAB — LIPID PANEL: Triglycerides: 213 mg/dL — ABNORMAL HIGH (ref 0.0–149.0)

## 2012-12-05 LAB — BASIC METABOLIC PANEL
CO2: 30 mEq/L (ref 19–32)
Calcium: 9.2 mg/dL (ref 8.4–10.5)
Glucose, Bld: 84 mg/dL (ref 70–99)
Potassium: 4.6 mEq/L (ref 3.5–5.1)
Sodium: 140 mEq/L (ref 135–145)

## 2012-12-05 LAB — LDL CHOLESTEROL, DIRECT: Direct LDL: 146.4 mg/dL

## 2012-12-05 MED ORDER — METHYLPREDNISOLONE ACETATE 80 MG/ML IJ SUSP
80.0000 mg | Freq: Once | INTRAMUSCULAR | Status: AC
  Administered 2012-12-05: 80 mg via INTRAMUSCULAR

## 2012-12-05 MED ORDER — TAMSULOSIN HCL 0.4 MG PO CAPS: 0.4000 mg | ORAL_CAPSULE | Freq: Every day | ORAL | Status: DC

## 2012-12-05 MED ORDER — ALBUTEROL SULFATE HFA 108 (90 BASE) MCG/ACT IN AERS: 2.0000 | INHALATION_SPRAY | Freq: Four times a day (QID) | RESPIRATORY_TRACT | Status: DC | PRN

## 2012-12-05 MED ORDER — BECLOMETHASONE DIPROPIONATE 80 MCG/ACT IN AERS: 1.0000 | INHALATION_SPRAY | Freq: Two times a day (BID) | RESPIRATORY_TRACT | Status: DC

## 2012-12-05 NOTE — Assessment & Plan Note (Signed)
stable overall by history and exam, recent data reviewed with pt, and pt to continue medical treatment as before,  to f/u any worsening symptoms or concerns Lab Results  Component Value Date   LDLCALC 113* 06/04/2012

## 2012-12-05 NOTE — Assessment & Plan Note (Signed)
For f/u lab today, asymp

## 2012-12-05 NOTE — Addendum Note (Signed)
Addended by: Sharon Seller B on: 12/05/2012 10:13 AM   Modules accepted: Orders

## 2012-12-05 NOTE — Assessment & Plan Note (Signed)
Mild to mod, for depomedrol IM today, to re-start the qvar, encouraged daily use, to f/u any worsening symptoms or concerns

## 2012-12-05 NOTE — Assessment & Plan Note (Signed)
stable overall by history and exam, recent data reviewed with pt, and pt to continue medical treatment as before,  to f/u any worsening symptoms or concerns / BP Readings from Last 3 Encounters:  12/05/12 112/72  06/04/12 108/72  01/09/12 100/60

## 2012-12-05 NOTE — Patient Instructions (Addendum)
You had the flu shot today You had the steroid shot today Please continue all other medications as before, and refills have been done if requested (both of the inhalers) Remember to use the Qvar every day, even though it does not seem to makes things better right away The albuterol (proventil) makes you feel better right away, but does not actually make the asthma calm down  Please go to the LAB in the Basement (turn left off the elevator) for the tests to be done today You will be contacted by phone if any changes need to be made immediately.  Otherwise, you will receive a letter about your results with an explanation, but please check with MyChart first.  Please remember to sign up for My Chart if you have not done so, as this will be important to you in the future with finding out test results, communicating by private email, and scheduling acute appointments online when needed.  Please return in 6 months, or sooner if needed

## 2012-12-05 NOTE — Progress Notes (Signed)
Subjective:    Patient ID: Stephen Wilkins, male    DOB: February 06, 1928, 79 y.o.   MRN: [id number]  HPI  Here with 1 wk worsening sinus/nasal allergy symptoms also flare of worsening so/cough without production of sputum, albuterol helps more immed than qvar so has been using the qvar less, as he is want to do in the past.  No fever or feeling poorly, but with some weakness when wheezing is worse, with mild sob.  Pt denies chest pain, increased sob or doe, wheezing, orthopnea, PND, increased LE swelling, palpitations, dizziness or syncope except for the above. Denies hyper or hypo thyroid symptoms such as voice, skin or hair change. Past Medical History  Diagnosis Date  . ABNORMAL ELECTROCARDIOGRAM 06/21/2007  . ALLERGIC RHINITIS 03/23/2007  . ASTHMATIC BRONCHITIS, ACUTE 04/27/2007  . BENIGN PROSTATIC HYPERTROPHY 03/23/2007  . BRONCHITIS NOT SPECIFIED AS ACUTE OR CHRONIC 04/21/2007  . BURSITIS, RIGHT HIP 07/31/2008  . Cough 01/29/2009  . ERECTILE DYSFUNCTION, ORGANIC 04/17/2009  . ERECTILE DYSFUNCTION 03/23/2007  . FREQUENCY, URINARY 04/26/2010  . GERD 03/23/2007  . HIATAL HERNIA   . HYPERLIPIDEMIA 03/23/2007  . HYPERTENSION 03/23/2007  . MILD COGNITIVE IMPAIRMENT SO STATED 05/19/2010  . NEPHROLITHIASIS, HX OF 03/23/2007  . PSA, INCREASED 06/27/2008  . RASH-NONVESICULAR 03/23/2007  . REACTIVE AIRWAY DISEASE 01/14/2010  . SCHATZKI'S RING   . SCIATICA, RIGHT 04/28/2008  . Wheezing 04/17/2009  . Dementia 09/01/2010   Past Surgical History  Procedure Laterality Date  . Tonsillectomy      reports that he has quit smoking. He has never used smokeless tobacco. He reports that he does not drink alcohol or use illicit drugs. family history includes Cancer in his brother and another family member; Emphysema in his brother; Hypertension in an other family member. Allergies  Allergen Reactions  . Atorvastatin     REACTION: myalgia  . Doxazosin Mesylate     REACTION: dizziness   Current Outpatient  Prescriptions on File Prior to Visit  Medication Sig Dispense Refill  . ALPRAZolam (XANAX) 0.25 MG tablet Take 1 tablet (0.25 mg total) by mouth daily as needed for sleep.  30 tablet  2  . beclomethasone (QVAR) 80 MCG/ACT inhaler Inhale 1 puff into the lungs 2 (two) times daily.  1 Inhaler  12  . bisacodyl (DULCOLAX) 5 MG EC tablet Take 5 mg by mouth daily as needed.        . Cholecalciferol (VITAMIN D) 1000 UNITS capsule Take 1,000 Units by mouth daily.        . CRESTOR 20 MG tablet take 1 tablet by mouth once daily  30 tablet  5  . desoximetasone (TOPICORT) 0.25 % cream Apply topically 2 (two) times daily.  30 g  0  . DIOVAN 80 MG tablet take 1 tablet by mouth once daily  30 tablet  10  . irbesartan (AVAPRO) 75 MG tablet Take 1 tablet (75 mg total) by mouth daily.  90 tablet  3  . lansoprazole (PREVACID) 30 MG capsule TAKE 1 CAPSULE BY MOUTH ONCE DAILY  30 capsule  11  . Multiple Vitamins-Minerals (CENTRUM SILVER PO) Take by mouth daily.        . Omega-3 Fatty Acids (FISH OIL) 1200 MG CAPS Take by mouth daily.        . polyethylene glycol powder (MIRALAX) powder Take 1/2 capful by mouth once daily  1 g  0  . albuterol (PROVENTIL HFA;VENTOLIN HFA) 108 (90 BASE) MCG/ACT inhaler Inhale 2 puffs into the  lungs every 6 (six) hours as needed for wheezing.  1 Inhaler  11   No current facility-administered medications on file prior to visit.   Review of Systems  Constitutional: Negative for unexpected weight change, or unusual diaphoresis  HENT: Negative for tinnitus.   Eyes: Negative for photophobia and visual disturbance.  Respiratory: Negative for choking and stridor.   Gastrointestinal: Negative for vomiting and blood in stool.  Genitourinary: Negative for hematuria and decreased urine volume.  Musculoskeletal: Negative for acute joint swelling Skin: Negative for color change and wound.  Neurological: Negative for tremors and numbness other than noted  Psychiatric/Behavioral: Negative for  decreased concentration or  hyperactivity.       Objective:   Physical Exam BP 112/72  Pulse 75  Temp(Src) 98 F (36.7 C) (Oral)  Ht 6' (1.829 m)  Wt 207 lb (93.895 kg)  BMI 28.07 kg/m2  SpO2 95% VS noted,  Constitutional: Pt appears well-developed and well-nourished.  HENT: Head: NCAT.  Right Ear: External ear normal.  Left Ear: External ear normal.  Eyes: Conjunctivae and EOM are normal. Pupils are equal, round, and reactive to light.  Neck: Normal range of motion. Neck supple.  Cardiovascular: Normal rate and regular rhythm.   Pulmonary/Chest: Effort normal and breath sounds decresaed with few bilat wheeze  Abd:  Soft, NT, non-distended, + BS Neurological: Pt is alert. Not confused  Skin: Skin is warm. No erythema.  Psychiatric: Pt behavior is normal. Thought content normal.     Assessment & Plan:

## 2012-12-06 ENCOUNTER — Other Ambulatory Visit: Payer: Self-pay | Admitting: Internal Medicine

## 2012-12-06 MED ORDER — LEVOTHYROXINE SODIUM 25 MCG PO TABS: 25.0000 ug | ORAL_TABLET | Freq: Every day | ORAL | Status: DC

## 2012-12-19 DIAGNOSIS — L259 Unspecified contact dermatitis, unspecified cause: Secondary | ICD-10-CM | POA: Diagnosis not present

## 2012-12-19 DIAGNOSIS — D485 Neoplasm of uncertain behavior of skin: Secondary | ICD-10-CM | POA: Diagnosis not present

## 2013-01-04 ENCOUNTER — Ambulatory Visit (INDEPENDENT_AMBULATORY_CARE_PROVIDER_SITE_OTHER): Payer: Medicare Other | Admitting: Internal Medicine

## 2013-01-04 ENCOUNTER — Encounter: Payer: Self-pay | Admitting: Internal Medicine

## 2013-01-04 VITALS — BP 112/68 | HR 87 | Temp 97.1°F | Ht 72.0 in | Wt 205.0 lb

## 2013-01-04 DIAGNOSIS — J309 Allergic rhinitis, unspecified: Secondary | ICD-10-CM | POA: Diagnosis not present

## 2013-01-04 DIAGNOSIS — R42 Dizziness and giddiness: Secondary | ICD-10-CM | POA: Diagnosis not present

## 2013-01-04 DIAGNOSIS — J45909 Unspecified asthma, uncomplicated: Secondary | ICD-10-CM

## 2013-01-04 DIAGNOSIS — J069 Acute upper respiratory infection, unspecified: Secondary | ICD-10-CM | POA: Insufficient documentation

## 2013-01-04 MED ORDER — METHYLPREDNISOLONE ACETATE 80 MG/ML IJ SUSP
80.0000 mg | Freq: Once | INTRAMUSCULAR | Status: AC
  Administered 2013-01-04: 80 mg via INTRAMUSCULAR

## 2013-01-04 MED ORDER — AZITHROMYCIN 250 MG PO TABS: ORAL_TABLET | ORAL | Status: DC

## 2013-01-04 MED ORDER — MECLIZINE HCL 12.5 MG PO TABS: 12.5000 mg | ORAL_TABLET | Freq: Three times a day (TID) | ORAL | Status: DC | PRN

## 2013-01-04 MED ORDER — PREDNISONE 10 MG PO TABS: ORAL_TABLET | ORAL | Status: DC

## 2013-01-04 NOTE — Patient Instructions (Signed)
You had the steroid shot today Please take all new medication as prescribed - the antibiotic, prednisone (low dose for 5 days only), and meclizine as needed for the dizziness Please continue all other medications as before You can also take Delsym OTC for cough, and/or Mucinex (or it's generic off brand) for congestion, and tylenol as needed for pain.  Please remember to sign up for My Chart if you have not done so, as this will be important to you in the future with finding out test results, communicating by private email, and scheduling acute appointments online when needed.

## 2013-01-04 NOTE — Progress Notes (Signed)
Subjective:    Patient ID: Stephen Wilkins, male    DOB: 09/22/1927, 79 y.o.   MRN: [id number]  HPI   Here with 2-3 days acute onset fever, facial pain, pressure, headache, general weakness and malaise, and greenish d/c, with mild ST and cough, as well as dizziness, ear fullness, decreased hearing, benadryl helpe somewhat but pt denies chest pain, wheezing, increased sob or doe, orthopnea, PND, increased LE swelling, palpitations, dizziness or syncope. Pt denies new neurological symptoms such as new headache, or facial or extremity weakness or numbness   Past Medical History  Diagnosis Date  . ABNORMAL ELECTROCARDIOGRAM 06/21/2007  . ALLERGIC RHINITIS 03/23/2007  . ASTHMATIC BRONCHITIS, ACUTE 04/27/2007  . BENIGN PROSTATIC HYPERTROPHY 03/23/2007  . BRONCHITIS NOT SPECIFIED AS ACUTE OR CHRONIC 04/21/2007  . BURSITIS, RIGHT HIP 07/31/2008  . Cough 01/29/2009  . ERECTILE DYSFUNCTION, ORGANIC 04/17/2009  . ERECTILE DYSFUNCTION 03/23/2007  . FREQUENCY, URINARY 04/26/2010  . GERD 03/23/2007  . HIATAL HERNIA   . HYPERLIPIDEMIA 03/23/2007  . HYPERTENSION 03/23/2007  . MILD COGNITIVE IMPAIRMENT SO STATED 05/19/2010  . NEPHROLITHIASIS, HX OF 03/23/2007  . PSA, INCREASED 06/27/2008  . RASH-NONVESICULAR 03/23/2007  . REACTIVE AIRWAY DISEASE 01/14/2010  . SCHATZKI'S RING   . SCIATICA, RIGHT 04/28/2008  . Wheezing 04/17/2009  . Dementia 09/01/2010   Past Surgical History  Procedure Laterality Date  . Tonsillectomy      reports that he has quit smoking. He has never used smokeless tobacco. He reports that he does not drink alcohol or use illicit drugs. family history includes Cancer in his brother and another family member; Emphysema in his brother; Hypertension in an other family member. Allergies  Allergen Reactions  . Atorvastatin     REACTION: myalgia  . Doxazosin Mesylate     REACTION: dizziness   Current Outpatient Prescriptions on File Prior to Visit  Medication Sig Dispense Refill  .  albuterol (PROVENTIL HFA;VENTOLIN HFA) 108 (90 BASE) MCG/ACT inhaler Inhale 2 puffs into the lungs every 6 (six) hours as needed for wheezing.  1 Inhaler  11  . ALPRAZolam (XANAX) 0.25 MG tablet Take 1 tablet (0.25 mg total) by mouth daily as needed for sleep.  30 tablet  2  . beclomethasone (QVAR) 80 MCG/ACT inhaler Inhale 1 puff into the lungs 2 (two) times daily.  1 Inhaler  12  . bisacodyl (DULCOLAX) 5 MG EC tablet Take 5 mg by mouth daily as needed.        . Cholecalciferol (VITAMIN D) 1000 UNITS capsule Take 1,000 Units by mouth daily.        . CRESTOR 20 MG tablet take 1 tablet by mouth once daily  30 tablet  5  . desoximetasone (TOPICORT) 0.25 % cream Apply topically 2 (two) times daily.  30 g  0  . DIOVAN 80 MG tablet take 1 tablet by mouth once daily  30 tablet  10  . irbesartan (AVAPRO) 75 MG tablet Take 1 tablet (75 mg total) by mouth daily.  90 tablet  3  . lansoprazole (PREVACID) 30 MG capsule TAKE 1 CAPSULE BY MOUTH ONCE DAILY  30 capsule  11  . levothyroxine (SYNTHROID, LEVOTHROID) 25 MCG tablet Take 1 tablet (25 mcg total) by mouth daily before breakfast.  90 tablet  3  . Multiple Vitamins-Minerals (CENTRUM SILVER PO) Take by mouth daily.        . Omega-3 Fatty Acids (FISH OIL) 1200 MG CAPS Take by mouth daily.        Marland Kitchen  polyethylene glycol powder (MIRALAX) powder Take 1/2 capful by mouth once daily  1 g  0  . tamsulosin (FLOMAX) 0.4 MG CAPS capsule Take 1 capsule (0.4 mg total) by mouth daily.  90 capsule  3   No current facility-administered medications on file prior to visit.   Review of Systems  Constitutional: Negative for unexpected weight change, or unusual diaphoresis  HENT: Negative for tinnitus.   Eyes: Negative for photophobia and visual disturbance.  Respiratory: Negative for choking and stridor.   Gastrointestinal: Negative for vomiting and blood in stool.  Genitourinary: Negative for hematuria and decreased urine volume.  Musculoskeletal: Negative for acute  joint swelling Skin: Negative for color change and wound.  Neurological: Negative for tremors and numbness other than noted  Psychiatric/Behavioral: Negative for decreased concentration or  hyperactivity.       Objective:   Physical Exam BP 112/68  Pulse 87  Temp(Src) 97.1 F (36.2 C) (Oral)  Ht 6' (1.829 m)  Wt 205 lb (92.987 kg)  BMI 27.8 kg/m2  SpO2 94% VS noted,  Constitutional: Pt appears well-developed and well-nourished.  HENT: Head: NCAT.  Right Ear: External ear normal.  Left Ear: External ear normal.  Bilat tm's with mild erythema.  Max sinus areas mild tender.  Pharynx with mild erythema, no exudate Eyes: Conjunctivae and EOM are normal. Pupils are equal, round, and reactive to light.  Neck: Normal range of motion. Neck supple.  Cardiovascular: Normal rate and regular rhythm.   Pulmonary/Chest: Effort normal and breath sounds normal.  Neurological: Pt is alert. Not confused  Skin: Skin is warm. No erythema.  Psychiatric: Pt behavior is normal. Thought content normal.     Assessment & Plan:

## 2013-01-05 NOTE — Assessment & Plan Note (Signed)
Middle ear related liekly, for meclizine prn,  to f/u any worsening symptoms or concerns

## 2013-01-05 NOTE — Assessment & Plan Note (Signed)
Mild to mod, for antibx course,  to f/u any worsening symptoms or concerns 

## 2013-01-05 NOTE — Assessment & Plan Note (Signed)
stable overall by history and exam, recent data reviewed with pt, and pt to continue medical treatment as before,  to f/u any worsening symptoms or concerns SpO2 Readings from Last 3 Encounters:  01/04/13 94%  12/05/12 95%  06/04/12 97%

## 2013-01-05 NOTE — Assessment & Plan Note (Signed)
Mild to mod, for depomedrol/predpack asd, allegra prn,  to f/u any worsening symptoms or concerns

## 2013-02-07 ENCOUNTER — Other Ambulatory Visit: Payer: Self-pay | Admitting: Internal Medicine

## 2013-02-27 ENCOUNTER — Encounter: Payer: Self-pay | Admitting: Internal Medicine

## 2013-02-27 ENCOUNTER — Ambulatory Visit (INDEPENDENT_AMBULATORY_CARE_PROVIDER_SITE_OTHER): Payer: Medicare Other | Admitting: Internal Medicine

## 2013-02-27 VITALS — BP 120/72 | HR 81 | Temp 97.1°F | Ht 72.0 in | Wt 201.1 lb

## 2013-02-27 DIAGNOSIS — J45909 Unspecified asthma, uncomplicated: Secondary | ICD-10-CM

## 2013-02-27 DIAGNOSIS — R21 Rash and other nonspecific skin eruption: Secondary | ICD-10-CM | POA: Diagnosis not present

## 2013-02-27 DIAGNOSIS — F0390 Unspecified dementia without behavioral disturbance: Secondary | ICD-10-CM | POA: Diagnosis not present

## 2013-02-27 MED ORDER — NYSTATIN 100000 UNIT/GM EX POWD: CUTANEOUS | Status: DC

## 2013-02-27 NOTE — Progress Notes (Signed)
Subjective:    Patient ID: Stephen Wilkins, male    DOB: [birth year], 79 y.o.   MRN: [id number]  HPI  Here with c/o rash to bilat groin areas seemed to get worse recently with more exercise with sweating as well as tight shorts with some discomfort and chafing.  Pt denies chest pain, increased sob or doe, wheezing, orthopnea, PND, increased LE swelling, palpitations, dizziness or syncope. Pt denies new neurological symptoms such as new headache, or facial or extremity weakness or numbness   Pt denies polydipsia, polyuria.  Has some topicort but not tried this for this rash. No fever, did have some blood on shorts when awoke after scratching during the night which startled him. Dementia overall stable symptomatically, and not assoc with behavioral changes such as hallucinations, paranoia, or agitation.  Past Medical History  Diagnosis Date  . ABNORMAL ELECTROCARDIOGRAM 06/21/2007  . ALLERGIC RHINITIS 03/23/2007  . ASTHMATIC BRONCHITIS, ACUTE 04/27/2007  . BENIGN PROSTATIC HYPERTROPHY 03/23/2007  . BRONCHITIS NOT SPECIFIED AS ACUTE OR CHRONIC 04/21/2007  . BURSITIS, RIGHT HIP 07/31/2008  . Cough 01/29/2009  . ERECTILE DYSFUNCTION, ORGANIC 04/17/2009  . ERECTILE DYSFUNCTION 03/23/2007  . FREQUENCY, URINARY 04/26/2010  . GERD 03/23/2007  . HIATAL HERNIA   . HYPERLIPIDEMIA 03/23/2007  . HYPERTENSION 03/23/2007  . MILD COGNITIVE IMPAIRMENT SO STATED 05/19/2010  . NEPHROLITHIASIS, HX OF 03/23/2007  . PSA, INCREASED 06/27/2008  . RASH-NONVESICULAR 03/23/2007  . REACTIVE AIRWAY DISEASE 01/14/2010  . SCHATZKI'S RING   . SCIATICA, RIGHT 04/28/2008  . Wheezing 04/17/2009  . Dementia 09/01/2010   Past Surgical History  Procedure Laterality Date  . Tonsillectomy      reports that he has quit smoking. He has never used smokeless tobacco. He reports that he does not drink alcohol or use illicit drugs. family history includes Cancer in his brother and another family member; Emphysema in his brother;  Hypertension in an other family member. Allergies  Allergen Reactions  . Atorvastatin     REACTION: myalgia  . Doxazosin Mesylate     REACTION: dizziness   Current Outpatient Prescriptions on File Prior to Visit  Medication Sig Dispense Refill  . albuterol (PROVENTIL HFA;VENTOLIN HFA) 108 (90 BASE) MCG/ACT inhaler Inhale 2 puffs into the lungs every 6 (six) hours as needed for wheezing.  1 Inhaler  11  . ALPRAZolam (XANAX) 0.25 MG tablet Take 1 tablet (0.25 mg total) by mouth daily as needed for sleep.  30 tablet  2  . azithromycin (ZITHROMAX Z-PAK) 250 MG tablet Use as directed  6 each  1  . beclomethasone (QVAR) 80 MCG/ACT inhaler Inhale 1 puff into the lungs 2 (two) times daily.  1 Inhaler  12  . bisacodyl (DULCOLAX) 5 MG EC tablet Take 5 mg by mouth daily as needed.        . Cholecalciferol (VITAMIN D) 1000 UNITS capsule Take 1,000 Units by mouth daily.        . CRESTOR 20 MG tablet take 1 tablet by mouth once daily  30 tablet  11  . desoximetasone (TOPICORT) 0.25 % cream Apply topically 2 (two) times daily.  30 g  0  . DIOVAN 80 MG tablet take 1 tablet by mouth once daily  30 tablet  10  . irbesartan (AVAPRO) 75 MG tablet Take 1 tablet (75 mg total) by mouth daily.  90 tablet  3  . lansoprazole (PREVACID) 30 MG capsule TAKE 1 CAPSULE BY MOUTH ONCE DAILY  30 capsule  11  .  levothyroxine (SYNTHROID, LEVOTHROID) 25 MCG tablet Take 1 tablet (25 mcg total) by mouth daily before breakfast.  90 tablet  3  . meclizine (ANTIVERT) 12.5 MG tablet Take 1 tablet (12.5 mg total) by mouth 3 (three) times daily as needed.  30 tablet  1  . Multiple Vitamins-Minerals (CENTRUM SILVER PO) Take by mouth daily.        . Omega-3 Fatty Acids (FISH OIL) 1200 MG CAPS Take by mouth daily.        . polyethylene glycol powder (MIRALAX) powder Take 1/2 capful by mouth once daily  1 g  0  . predniSONE (DELTASONE) 10 MG tablet 2 tabs by mouth per day for 5 days  10 tablet  0  . tamsulosin (FLOMAX) 0.4 MG CAPS  capsule Take 1 capsule (0.4 mg total) by mouth daily.  90 capsule  3   No current facility-administered medications on file prior to visit.   Review of Systems  Constitutional: Negative for unexpected weight change, or unusual diaphoresis  HENT: Negative for tinnitus.   Eyes: Negative for photophobia and visual disturbance.  Respiratory: Negative for choking and stridor.   Gastrointestinal: Negative for vomiting and blood in stool.  Genitourinary: Negative for hematuria and decreased urine volume.  Musculoskeletal: Negative for acute joint swelling Skin: Negative for color change and wound.  Neurological: Negative for tremors and numbness other than noted  Psychiatric/Behavioral: Negative for decreased concentration or  hyperactivity.       Objective:   Physical Exam BP 120/72  Pulse 81  Temp(Src) 97.1 F (36.2 C) (Oral)  Ht 6' (1.829 m)  Wt 201 lb 2 oz (91.23 kg)  BMI 27.27 kg/m2  SpO2 93% VS noted,  Constitutional: Pt appears well-developed and well-nourished.  HENT: Head: NCAT.  Right Ear: External ear normal.  Left Ear: External ear normal.  Eyes: Conjunctivae and EOM are normal. Pupils are equal, round, and reactive to light.  Neck: Normal range of motion. Neck supple.  Cardiovascular: Normal rate and regular rhythm.   Pulmonary/Chest: Effort normal and breath sounds normal.  Abd:  Soft, NT, non-distended, + BS Neurological: Pt is alert. At baseline confused  Skin: with bilat mild groin erythema, nontender, some excoriations noted Psychiatric: Pt behavior is normal. Thought content c/w mild dementia.         Assessment & Plan:

## 2013-02-27 NOTE — Assessment & Plan Note (Signed)
C/w intertrigo and/or chafing; for exercise underwear to avoid chafing, and trial nystatin powder, consider derm

## 2013-02-27 NOTE — Patient Instructions (Addendum)
Please take all new medication as prescribed - the nystatin powder for the rash Please continue all other medications as before, and refills have been done if requested.  Remember to consider Underarmour undershorts for exercise as we have had good luck with no chafing

## 2013-02-27 NOTE — Assessment & Plan Note (Signed)
stable overall by history and exam, recent data reviewed with pt, and pt to continue medical treatment as before,  to f/u any worsening symptoms or concerns SpO2 Readings from Last 3 Encounters:  02/27/13 93%  01/04/13 94%  12/05/12 95%

## 2013-02-27 NOTE — Assessment & Plan Note (Signed)
stable overall by history and exam, and pt to continue medical treatment as before,  to f/u any worsening symptoms or concerns 

## 2013-02-27 NOTE — Progress Notes (Signed)
Pre-visit discussion using our clinic review tool. No additional management support is needed unless otherwise documented below in the visit note.  

## 2013-05-16 ENCOUNTER — Other Ambulatory Visit: Payer: Self-pay | Admitting: Internal Medicine

## 2013-05-22 ENCOUNTER — Ambulatory Visit: Payer: Medicare Other

## 2013-06-05 ENCOUNTER — Encounter: Payer: Self-pay | Admitting: Internal Medicine

## 2013-06-05 ENCOUNTER — Ambulatory Visit (INDEPENDENT_AMBULATORY_CARE_PROVIDER_SITE_OTHER): Payer: Medicare Other | Admitting: Internal Medicine

## 2013-06-05 VITALS — BP 122/82 | HR 81 | Temp 97.7°F | Ht 72.0 in | Wt 202.0 lb

## 2013-06-05 DIAGNOSIS — E785 Hyperlipidemia, unspecified: Secondary | ICD-10-CM

## 2013-06-05 DIAGNOSIS — J45909 Unspecified asthma, uncomplicated: Secondary | ICD-10-CM | POA: Diagnosis not present

## 2013-06-05 DIAGNOSIS — Z23 Encounter for immunization: Secondary | ICD-10-CM | POA: Diagnosis not present

## 2013-06-05 DIAGNOSIS — I10 Essential (primary) hypertension: Secondary | ICD-10-CM | POA: Diagnosis not present

## 2013-06-05 DIAGNOSIS — F0390 Unspecified dementia without behavioral disturbance: Secondary | ICD-10-CM | POA: Diagnosis not present

## 2013-06-05 MED ORDER — VALSARTAN 80 MG PO TABS: ORAL_TABLET | ORAL | Status: DC

## 2013-06-05 NOTE — Progress Notes (Signed)
Subjective:    Patient ID: Stephen Wilkins, male    DOB: [birth year], 80 y.o.   MRN: [id number]  HPI Here to f/u; overall doing ok,  Pt denies chest pain, increased sob or doe, wheezing, orthopnea, PND, increased LE swelling, palpitations, dizziness or syncope.  Pt denies polydipsia, polyuria, or low sugar symptoms such as weakness or confusion improved with po intake.  Pt denies new neurological symptoms such as new headache, or facial or extremity weakness or numbness.   Pt states overall good compliance with meds, has been trying to follow lower cholesterol diet, with wt overall stable,  but little exercise however. Has some fatigue with crestor but willing to continue to take. Dementia overall stable symptomatically, and not assoc with behavioral changes such as hallucinations, paranoia, or agitation.  Past Medical History  Diagnosis Date  . ABNORMAL ELECTROCARDIOGRAM 06/21/2007  . ALLERGIC RHINITIS 03/23/2007  . ASTHMATIC BRONCHITIS, ACUTE 04/27/2007  . BENIGN PROSTATIC HYPERTROPHY 03/23/2007  . BRONCHITIS NOT SPECIFIED AS ACUTE OR CHRONIC 04/21/2007  . BURSITIS, RIGHT HIP 07/31/2008  . Cough 01/29/2009  . ERECTILE DYSFUNCTION, ORGANIC 04/17/2009  . ERECTILE DYSFUNCTION 03/23/2007  . FREQUENCY, URINARY 04/26/2010  . GERD 03/23/2007  . HIATAL HERNIA   . HYPERLIPIDEMIA 03/23/2007  . HYPERTENSION 03/23/2007  . MILD COGNITIVE IMPAIRMENT SO STATED 05/19/2010  . NEPHROLITHIASIS, HX OF 03/23/2007  . PSA, INCREASED 06/27/2008  . RASH-NONVESICULAR 03/23/2007  . REACTIVE AIRWAY DISEASE 01/14/2010  . SCHATZKI'S RING   . SCIATICA, RIGHT 04/28/2008  . Wheezing 04/17/2009  . Dementia 09/01/2010   Past Surgical History  Procedure Laterality Date  . Tonsillectomy      reports that he has quit smoking. He has never used smokeless tobacco. He reports that he does not drink alcohol or use illicit drugs. family history includes Cancer in his brother and another family member; Emphysema in his brother;  Hypertension in an other family member. Allergies  Allergen Reactions  . Atorvastatin     REACTION: myalgia  . Doxazosin Mesylate     REACTION: dizziness   Current Outpatient Prescriptions on File Prior to Visit  Medication Sig Dispense Refill  . albuterol (PROVENTIL HFA;VENTOLIN HFA) 108 (90 BASE) MCG/ACT inhaler Inhale 2 puffs into the lungs every 6 (six) hours as needed for wheezing.  1 Inhaler  11  . ALPRAZolam (XANAX) 0.25 MG tablet Take 1 tablet (0.25 mg total) by mouth daily as needed for sleep.  30 tablet  2  . beclomethasone (QVAR) 80 MCG/ACT inhaler Inhale 1 puff into the lungs 2 (two) times daily.  1 Inhaler  12  . Cholecalciferol (VITAMIN D) 1000 UNITS capsule Take 1,000 Units by mouth daily.        . CRESTOR 20 MG tablet take 1 tablet by mouth once daily  30 tablet  11  . desoximetasone (TOPICORT) 0.25 % cream apply topically to affected area twice a day  30 g  0  . lansoprazole (PREVACID) 30 MG capsule TAKE 1 CAPSULE BY MOUTH ONCE DAILY  30 capsule  11  . levothyroxine (SYNTHROID, LEVOTHROID) 25 MCG tablet Take 1 tablet (25 mcg total) by mouth daily before breakfast.  90 tablet  3  . meclizine (ANTIVERT) 12.5 MG tablet Take 1 tablet (12.5 mg total) by mouth 3 (three) times daily as needed.  30 tablet  1  . Multiple Vitamins-Minerals (CENTRUM SILVER PO) Take by mouth daily.        Marland Kitchen nystatin (MYCOSTATIN) powder Use as directed twice per day as  needed  30 g  0  . Omega-3 Fatty Acids (FISH OIL) 1200 MG CAPS Take by mouth daily.        . polyethylene glycol powder (MIRALAX) powder Take 1/2 capful by mouth once daily  1 g  0  . tamsulosin (FLOMAX) 0.4 MG CAPS capsule Take 1 capsule (0.4 mg total) by mouth daily.  90 capsule  3   No current facility-administered medications on file prior to visit.    Review of Systems  Constitutional: Negative for unexpected weight change, or unusual diaphoresis  HENT: Negative for tinnitus.   Eyes: Negative for photophobia and visual  disturbance.  Respiratory: Negative for choking and stridor.   Gastrointestinal: Negative for vomiting and blood in stool.  Genitourinary: Negative for hematuria and decreased urine volume.  Musculoskeletal: Negative for acute joint swelling Skin: Negative for color change and wound.  Neurological: Negative for tremors and numbness other than noted  Psychiatric/Behavioral: Negative for decreased concentration or  hyperactivity.       Objective:   Physical Exam BP 122/82  Pulse 81  Temp(Src) 97.7 F (36.5 C) (Oral)  Ht 6' (1.829 m)  Wt 202 lb (91.627 kg)  BMI 27.39 kg/m2  SpO2 97% VS noted,  Constitutional: Pt appears well-developed and well-nourished.  HENT: Head: NCAT.  Right Ear: External ear normal.  Left Ear: External ear normal.  Eyes: Conjunctivae and EOM are normal. Pupils are equal, round, and reactive to light.  Neck: Normal range of motion. Neck supple.  Cardiovascular: Normal rate and regular rhythm.   Pulmonary/Chest: Effort normal and breath sounds decreased, no rales or wheezing.  Abd:  Soft, NT, non-distended, + BS Neurological: Pt is alert. At baseline confused  Skin: Skin is warm. No erythema.  Psychiatric: Pt behavior is normal. Mild nervous     Assessment & Plan:

## 2013-06-05 NOTE — Assessment & Plan Note (Signed)
stable overall by history and exam, recent data reviewed with pt, and pt to continue medical treatment as before,  to f/u any worsening symptoms or concerns Lab Results  Component Value Date   WBC 7.0 12/05/2012   HGB 13.8 12/05/2012   HCT 40.2 12/05/2012   PLT 222.0 12/05/2012   GLUCOSE 84 12/05/2012   CHOL 213* 12/05/2012   TRIG 213.0* 12/05/2012   HDL 38.10* 12/05/2012   LDLDIRECT 146.4 12/05/2012   LDLCALC 113* 06/04/2012   ALT 17 12/05/2012   AST 21 12/05/2012   NA 140 12/05/2012   K 4.6 12/05/2012   CL 105 12/05/2012   CREATININE 1.4 12/05/2012   BUN 25* 12/05/2012   CO2 30 12/05/2012   TSH 7.79* 12/05/2012   PSA 3.47 11/30/2011

## 2013-06-05 NOTE — Patient Instructions (Addendum)
You had the new Prevnar pneumonia shot today Please continue all other medications as before, and refills have been done if requested  - the generic for diovan  Please have the pharmacy call with any other refills you may need.  Please remember to sign up for MyChart if you have not done so, as this will be important to you in the future with finding out test results, communicating by private email, and scheduling acute appointments online when needed.  Please return in 3 months, or sooner if needed

## 2013-06-05 NOTE — Assessment & Plan Note (Signed)
stable overall by history and exam, recent data reviewed with pt, and pt to continue medical treatment as before,  to f/u any worsening symptoms or concerns BP Readings from Last 3 Encounters:  06/05/13 122/82  02/27/13 120/72  01/04/13 112/68

## 2013-06-05 NOTE — Progress Notes (Signed)
Pre-visit discussion using our clinic review tool. No additional management support is needed unless otherwise documented below in the visit note.  

## 2013-06-05 NOTE — Addendum Note (Signed)
Addended by: Sharon Seller B on: 06/05/2013 10:20 AM   Modules accepted: Orders

## 2013-06-05 NOTE — Assessment & Plan Note (Signed)
stable overall by history and exam, recent data reviewed with pt, and pt to continue medical treatment as before,  to f/u any worsening symptoms or concerns Lab Results  Component Value Date   LDLCALC 113* 06/04/2012

## 2013-06-05 NOTE — Assessment & Plan Note (Signed)
stable overall by history and exam, recent data reviewed with pt, and pt to continue medical treatment as before,  to f/u any worsening symptoms or concerns SpO2 Readings from Last 3 Encounters:  06/05/13 97%  02/27/13 93%  01/04/13 94%

## 2013-07-17 ENCOUNTER — Other Ambulatory Visit: Payer: Self-pay | Admitting: Internal Medicine

## 2013-07-17 ENCOUNTER — Telehealth: Payer: Self-pay | Admitting: Internal Medicine

## 2013-07-17 MED ORDER — DIOVAN 80 MG PO TABS: ORAL_TABLET | ORAL | Status: DC

## 2013-07-17 NOTE — Telephone Encounter (Signed)
Pt request phone call from the assistant about valsartan. Pt stated that he does not like this generic and he wants to go back to Diovan. Pt stated [employer] need an approval from Dr. Jenny Reichmann in order to get this med. Please advise.

## 2013-07-17 NOTE — Telephone Encounter (Signed)
Informed the patient of MD ok to proceed with PA for Diovan.  The patient will contact [employer] to send appropriate paper work to do PA.

## 2013-07-17 NOTE — Telephone Encounter (Signed)
We can try, but this is usually not approved, if there is no specific reason to do so  [place] for robin to try

## 2013-08-28 DIAGNOSIS — N529 Male erectile dysfunction, unspecified: Secondary | ICD-10-CM | POA: Diagnosis not present

## 2013-08-28 DIAGNOSIS — N139 Obstructive and reflux uropathy, unspecified: Secondary | ICD-10-CM | POA: Diagnosis not present

## 2013-08-28 DIAGNOSIS — N401 Enlarged prostate with lower urinary tract symptoms: Secondary | ICD-10-CM | POA: Diagnosis not present

## 2013-08-28 DIAGNOSIS — N200 Calculus of kidney: Secondary | ICD-10-CM | POA: Diagnosis not present

## 2013-09-06 ENCOUNTER — Encounter: Payer: Self-pay | Admitting: Internal Medicine

## 2013-09-06 ENCOUNTER — Other Ambulatory Visit (INDEPENDENT_AMBULATORY_CARE_PROVIDER_SITE_OTHER): Payer: Medicare Other

## 2013-09-06 ENCOUNTER — Ambulatory Visit (INDEPENDENT_AMBULATORY_CARE_PROVIDER_SITE_OTHER): Payer: Medicare Other | Admitting: Internal Medicine

## 2013-09-06 VITALS — BP 118/72 | HR 77 | Temp 97.8°F | Ht 72.0 in | Wt 202.5 lb

## 2013-09-06 DIAGNOSIS — R972 Elevated prostate specific antigen [PSA]: Secondary | ICD-10-CM

## 2013-09-06 DIAGNOSIS — E039 Hypothyroidism, unspecified: Secondary | ICD-10-CM

## 2013-09-06 DIAGNOSIS — F411 Generalized anxiety disorder: Secondary | ICD-10-CM | POA: Diagnosis not present

## 2013-09-06 DIAGNOSIS — E785 Hyperlipidemia, unspecified: Secondary | ICD-10-CM

## 2013-09-06 DIAGNOSIS — I10 Essential (primary) hypertension: Secondary | ICD-10-CM

## 2013-09-06 DIAGNOSIS — N189 Chronic kidney disease, unspecified: Secondary | ICD-10-CM

## 2013-09-06 HISTORY — DX: Hypothyroidism, unspecified: E03.9

## 2013-09-06 HISTORY — DX: Generalized anxiety disorder: F41.1

## 2013-09-06 LAB — PSA: PSA: 5.32 ng/mL — AB (ref 0.10–4.00)

## 2013-09-06 LAB — BASIC METABOLIC PANEL
BUN: 22 mg/dL (ref 6–23)
CHLORIDE: 104 meq/L (ref 96–112)
CO2: 32 meq/L (ref 19–32)
Calcium: 9.9 mg/dL (ref 8.4–10.5)
Creatinine, Ser: 1.4 mg/dL (ref 0.4–1.5)
GFR: 53.58 mL/min — ABNORMAL LOW (ref >60.00)
Glucose, Bld: 89 mg/dL (ref 70–99)
Potassium: 4.9 mEq/L (ref 3.5–5.1)
SODIUM: 140 meq/L (ref 135–145)

## 2013-09-06 LAB — LIPID PANEL
Cholesterol: 236 mg/dL — ABNORMAL HIGH (ref 0–200)
HDL: 42.7 mg/dL (ref >39.00)
LDL Cholesterol: 156 mg/dL — ABNORMAL HIGH (ref 0–99)
NONHDL: 193.3
Total CHOL/HDL Ratio: 6
Triglycerides: 189 mg/dL — ABNORMAL HIGH (ref 0.0–149.0)
VLDL: 37.8 mg/dL (ref 0.0–40.0)

## 2013-09-06 LAB — TSH: TSH: 9.96 u[IU]/mL — ABNORMAL HIGH (ref 0.35–4.50)

## 2013-09-06 MED ORDER — LEVOTHYROXINE SODIUM 25 MCG PO TABS: 25.0000 ug | ORAL_TABLET | Freq: Every day | ORAL | Status: DC

## 2013-09-06 MED ORDER — DESOXIMETASONE 0.25 % EX CREA: TOPICAL_CREAM | CUTANEOUS | Status: DC

## 2013-09-06 MED ORDER — VALSARTAN 80 MG PO TABS: 80.0000 mg | ORAL_TABLET | Freq: Every day | ORAL | Status: DC

## 2013-09-06 MED ORDER — ATORVASTATIN CALCIUM 40 MG PO TABS: 40.0000 mg | ORAL_TABLET | Freq: Every day | ORAL | Status: DC

## 2013-09-06 MED ORDER — ALPRAZOLAM 0.5 MG PO TABS: 0.5000 mg | ORAL_TABLET | Freq: Every day | ORAL | Status: DC | PRN

## 2013-09-06 NOTE — Progress Notes (Signed)
Subjective:    Patient ID: Stephen Wilkins, male    DOB: 01-25-1928, 80 y.o.   MRN: [id number]  HPI   Here to f/u; overall doing ok,  Pt denies chest pain, increased sob or doe, wheezing, orthopnea, PND, increased LE swelling, palpitations, dizziness or syncope.  Pt denies polydipsia, polyuria, or low sugar symptoms such as weakness or confusion improved with po intake.  Pt denies new neurological symptoms such as new headache, or facial or extremity weakness or numbness.   Pt states overall good compliance with meds, has been trying to follow lower cholesterol diet, with wt overall stable,  but little exercise however.  States crestor causes some fatigue, asks to change to lipitor. Admits to getting away from taking his meds every day.  Needs diovan brand name changed to generic due to cost.  Asks for topicort refill.   Denies worsening depressive symptoms, suicidal ideation, or panic; has ongoing anxiety, asks for increased med.  Tends to wake up at 1am most nights for a couple of hours. Past Medical History  Diagnosis Date  . ABNORMAL ELECTROCARDIOGRAM 06/21/2007  . ALLERGIC RHINITIS 03/23/2007  . ASTHMATIC BRONCHITIS, ACUTE 04/27/2007  . BENIGN PROSTATIC HYPERTROPHY 03/23/2007  . BRONCHITIS NOT SPECIFIED AS ACUTE OR CHRONIC 04/21/2007  . BURSITIS, RIGHT HIP 07/31/2008  . Cough 01/29/2009  . ERECTILE DYSFUNCTION, ORGANIC 04/17/2009  . ERECTILE DYSFUNCTION 03/23/2007  . FREQUENCY, URINARY 04/26/2010  . GERD 03/23/2007  . HIATAL HERNIA   . HYPERLIPIDEMIA 03/23/2007  . HYPERTENSION 03/23/2007  . MILD COGNITIVE IMPAIRMENT SO STATED 05/19/2010  . NEPHROLITHIASIS, HX OF 03/23/2007  . PSA, INCREASED 06/27/2008  . RASH-NONVESICULAR 03/23/2007  . REACTIVE AIRWAY DISEASE 01/14/2010  . SCHATZKI'S RING   . SCIATICA, RIGHT 04/28/2008  . Wheezing 04/17/2009  . Dementia 09/01/2010   Past Surgical History  Procedure Laterality Date  . Tonsillectomy      reports that he has quit smoking. He has never used  smokeless tobacco. He reports that he does not drink alcohol or use illicit drugs. family history includes Cancer in his brother and another family member; Emphysema in his brother; Hypertension in an other family member. Allergies  Allergen Reactions  . Atorvastatin     REACTION: myalgia  . Doxazosin Mesylate     REACTION: dizziness   Current Outpatient Prescriptions on File Prior to Visit  Medication Sig Dispense Refill  . albuterol (PROVENTIL HFA;VENTOLIN HFA) 108 (90 BASE) MCG/ACT inhaler Inhale 2 puffs into the lungs every 6 (six) hours as needed for wheezing.  1 Inhaler  11  . ALPRAZolam (XANAX) 0.25 MG tablet Take 1 tablet (0.25 mg total) by mouth daily as needed for sleep.  30 tablet  2  . beclomethasone (QVAR) 80 MCG/ACT inhaler Inhale 1 puff into the lungs 2 (two) times daily.  1 Inhaler  12  . Cholecalciferol (VITAMIN D) 1000 UNITS capsule Take 1,000 Units by mouth daily.        . CRESTOR 20 MG tablet take 1 tablet by mouth once daily  30 tablet  11  . desoximetasone (TOPICORT) 0.25 % cream apply topically to affected area twice a day  30 g  0  . lansoprazole (PREVACID) 30 MG capsule TAKE 1 CAPSULE BY MOUTH ONCE DAILY  30 capsule  11  . levothyroxine (SYNTHROID, LEVOTHROID) 25 MCG tablet Take 1 tablet (25 mcg total) by mouth daily before breakfast.  90 tablet  3  . meclizine (ANTIVERT) 12.5 MG tablet Take 1 tablet (12.5 mg total)  by mouth 3 (three) times daily as needed.  30 tablet  1  . Multiple Vitamins-Minerals (CENTRUM SILVER PO) Take by mouth daily.        . Omega-3 Fatty Acids (FISH OIL) 1200 MG CAPS Take by mouth daily.        . polyethylene glycol powder (MIRALAX) powder Take 1/2 capful by mouth once daily  1 g  0  . tamsulosin (FLOMAX) 0.4 MG CAPS capsule Take 1 capsule (0.4 mg total) by mouth daily.  90 capsule  3   No current facility-administered medications on file prior to visit.   Review of Systems  Constitutional: Negative for unusual diaphoresis or other sweats   HENT: Negative for ringing in ear Eyes: Negative for double vision or worsening visual disturbance.  Respiratory: Negative for choking and stridor.   Gastrointestinal: Negative for vomiting or other signifcant bowel change Genitourinary: Negative for hematuria or decreased urine volume.  Musculoskeletal: Negative for other MSK pain or swelling Skin: Negative for color change and worsening wound.  Neurological: Negative for tremors and numbness other than noted  Psychiatric/Behavioral: Negative for decreased concentration or agitation other than above       Objective:   Physical Exam BP 118/72  Pulse 77  Temp(Src) 97.8 F (36.6 C) (Oral)  Ht 6' (1.829 m)  Wt 202 lb 8 oz (91.853 kg)  BMI 27.46 kg/m2  SpO2 97% VS noted,  Constitutional: Pt appears well-developed, well-nourished.  HENT: Head: NCAT.  Right Ear: External ear normal.  Left Ear: External ear normal.  Eyes: . Pupils are equal, round, and reactive to light. Conjunctivae and EOM are normal Neck: Normal range of motion. Neck supple.  Cardiovascular: Normal rate and regular rhythm.   Pulmonary/Chest: Effort normal and breath sounds normal.  Abd:  Soft, NT, ND, + BS Neurological: Pt is alert. At baseline confused , motor grossly intact Skin: Skin is warm. No rash Psychiatric: Pt behavior is normal. No agitation.     Assessment & Plan:

## 2013-09-06 NOTE — Assessment & Plan Note (Signed)
Ok for f/u psa today per pt request

## 2013-09-06 NOTE — Assessment & Plan Note (Signed)
Very mild, not taking the synthroid much it seems, for f/u tsh as well

## 2013-09-06 NOTE — Assessment & Plan Note (Signed)
Stable, ok to change diovan brand to generic, d/w pt, stable overall by history and exam, recent data reviewed with pt, and pt to continue medical treatment as before,  to f/u any worsening symptoms or concerns  BP Readings from Last 3 Encounters:  09/06/13 118/72  06/05/13 122/82  02/27/13 120/72

## 2013-09-06 NOTE — Assessment & Plan Note (Signed)
Ok for xanax refill,  to f/u any worsening symptoms or concerns 

## 2013-09-06 NOTE — Patient Instructions (Addendum)
OK to change the diovan brand name to valsartan  OK for the xanax at 0.5 mg refilled today  OK to stop the crestor, and start lipitor 40 mg per day  Please continue all other medications as before, and refills have been done if requested.  Please have the pharmacy call with any other refills you may need.  Please continue your efforts at being more active, low cholesterol diet, and weight control.  Please keep your appointments with your specialists as you may have planned  Please return in 6 months, or sooner if needed

## 2013-09-06 NOTE — Progress Notes (Signed)
Pre visit review using our clinic review tool, if applicable. No additional management support is needed unless otherwise documented below in the visit note. 

## 2013-09-06 NOTE — Assessment & Plan Note (Signed)
Ok to change crestor to lipitor due to fatigue and cost - done per emr, o/w stable overall by history and exam, recent data reviewed with pt, and pt to continue medical treatment as before,  to f/u any worsening symptoms or concerns  Lab Results  Component Value Date   LDLCALC 113* 06/04/2012

## 2013-09-06 NOTE — Assessment & Plan Note (Signed)
For f/u lab today 

## 2013-12-21 ENCOUNTER — Other Ambulatory Visit: Payer: Self-pay | Admitting: Internal Medicine

## 2014-01-01 DIAGNOSIS — D1801 Hemangioma of skin and subcutaneous tissue: Secondary | ICD-10-CM | POA: Diagnosis not present

## 2014-01-01 DIAGNOSIS — L820 Inflamed seborrheic keratosis: Secondary | ICD-10-CM | POA: Diagnosis not present

## 2014-01-01 DIAGNOSIS — L219 Seborrheic dermatitis, unspecified: Secondary | ICD-10-CM | POA: Diagnosis not present

## 2014-01-01 DIAGNOSIS — L821 Other seborrheic keratosis: Secondary | ICD-10-CM | POA: Diagnosis not present

## 2014-01-13 ENCOUNTER — Other Ambulatory Visit: Payer: Self-pay | Admitting: Internal Medicine

## 2014-01-15 DIAGNOSIS — H2513 Age-related nuclear cataract, bilateral: Secondary | ICD-10-CM | POA: Diagnosis not present

## 2014-01-15 DIAGNOSIS — H1859 Other hereditary corneal dystrophies: Secondary | ICD-10-CM | POA: Diagnosis not present

## 2014-01-15 DIAGNOSIS — H33301 Unspecified retinal break, right eye: Secondary | ICD-10-CM | POA: Diagnosis not present

## 2014-01-16 DIAGNOSIS — H1859 Other hereditary corneal dystrophies: Secondary | ICD-10-CM | POA: Diagnosis not present

## 2014-01-17 ENCOUNTER — Other Ambulatory Visit: Payer: Self-pay

## 2014-01-20 DIAGNOSIS — H1859 Other hereditary corneal dystrophies: Secondary | ICD-10-CM | POA: Diagnosis not present

## 2014-01-20 DIAGNOSIS — H2513 Age-related nuclear cataract, bilateral: Secondary | ICD-10-CM | POA: Diagnosis not present

## 2014-02-07 ENCOUNTER — Ambulatory Visit: Payer: Medicare Other

## 2014-02-07 ENCOUNTER — Ambulatory Visit (INDEPENDENT_AMBULATORY_CARE_PROVIDER_SITE_OTHER): Payer: Medicare Other | Admitting: *Deleted

## 2014-02-07 ENCOUNTER — Other Ambulatory Visit: Payer: Self-pay | Admitting: *Deleted

## 2014-02-07 DIAGNOSIS — Z23 Encounter for immunization: Secondary | ICD-10-CM

## 2014-02-07 MED ORDER — DESOXIMETASONE 0.25 % EX CREA: TOPICAL_CREAM | CUTANEOUS | Status: DC

## 2014-02-07 NOTE — Telephone Encounter (Signed)
Pt cam in to get his flu shot this am. He is requesting refill on desoximetasone cream. Inform pt will send to [facility]...Johny Chess

## 2014-02-17 DIAGNOSIS — H1859 Other hereditary corneal dystrophies: Secondary | ICD-10-CM | POA: Diagnosis not present

## 2014-03-12 ENCOUNTER — Other Ambulatory Visit: Payer: Self-pay | Admitting: Internal Medicine

## 2014-03-12 ENCOUNTER — Encounter: Payer: Self-pay | Admitting: Internal Medicine

## 2014-03-12 ENCOUNTER — Ambulatory Visit (INDEPENDENT_AMBULATORY_CARE_PROVIDER_SITE_OTHER): Payer: Medicare Other | Admitting: Internal Medicine

## 2014-03-12 ENCOUNTER — Other Ambulatory Visit (INDEPENDENT_AMBULATORY_CARE_PROVIDER_SITE_OTHER): Payer: Medicare Other

## 2014-03-12 VITALS — BP 116/74 | HR 68 | Temp 97.6°F | Ht 72.0 in | Wt 207.0 lb

## 2014-03-12 DIAGNOSIS — R972 Elevated prostate specific antigen [PSA]: Secondary | ICD-10-CM

## 2014-03-12 DIAGNOSIS — E785 Hyperlipidemia, unspecified: Secondary | ICD-10-CM | POA: Diagnosis not present

## 2014-03-12 DIAGNOSIS — F0390 Unspecified dementia without behavioral disturbance: Secondary | ICD-10-CM

## 2014-03-12 DIAGNOSIS — I10 Essential (primary) hypertension: Secondary | ICD-10-CM

## 2014-03-12 DIAGNOSIS — E890 Postprocedural hypothyroidism: Secondary | ICD-10-CM

## 2014-03-12 LAB — LIPID PANEL
CHOL/HDL RATIO: 4
CHOLESTEROL: 183 mg/dL (ref 0–200)
HDL: 47.8 mg/dL (ref >39.00)
NonHDL: 135.2
Triglycerides: 267 mg/dL — ABNORMAL HIGH (ref 0.0–149.0)
VLDL: 53.4 mg/dL — AB (ref 0.0–40.0)

## 2014-03-12 LAB — PSA: PSA: 3.94 ng/mL (ref 0.10–4.00)

## 2014-03-12 LAB — TSH: TSH: 9.15 u[IU]/mL — AB (ref 0.35–4.50)

## 2014-03-12 LAB — LDL CHOLESTEROL, DIRECT: Direct LDL: 110.5 mg/dL

## 2014-03-12 LAB — T4, FREE: FREE T4: 0.58 ng/dL — AB (ref 0.60–1.60)

## 2014-03-12 MED ORDER — PRAVASTATIN SODIUM 40 MG PO TABS: 40.0000 mg | ORAL_TABLET | Freq: Every day | ORAL | Status: DC

## 2014-03-12 MED ORDER — LEVOTHYROXINE SODIUM 50 MCG PO TABS: 50.0000 ug | ORAL_TABLET | Freq: Every day | ORAL | Status: DC

## 2014-03-12 MED ORDER — LEVOTHYROXINE SODIUM 75 MCG PO TABS: 75.0000 ug | ORAL_TABLET | Freq: Every day | ORAL | Status: DC

## 2014-03-12 NOTE — Assessment & Plan Note (Signed)
stable overall by history and exam, recent data reviewed with pt, and pt to increase the levothyroxin to 50 qd,  to f/u any worsening symptoms or concerns Lab Results  Component Value Date   TSH 9.96* 09/06/2013

## 2014-03-12 NOTE — Patient Instructions (Addendum)
Please check with your insurance regarding the Copay for the shingles shot; if the copay is [facility] for you, please make an appointment for a Nurse Visit for the shot  Please try Benadryl cream for further itching to the back of the head  OK to stay off the lipiitor and crestor as you have been doing  Please take all new medication as prescribed - the pravastatin 40 mg per day.  OK to increase the levothyroxine (for thyroid) to 50 mcg per day  Please go to the LAB in the Basement (turn left off the elevator) for the tests to be done today  You will be contacted by phone if any changes need to be made immediately.  Otherwise, you will receive a letter about your results with an explanation, but please check with MyChart first.  Please return in 6 months, or sooner if needed

## 2014-03-12 NOTE — Assessment & Plan Note (Signed)
stable overall by history and exam, recent data reviewed with pt with elev LDL, and pt to start pravastaitn 40 qd to see if better tolerated,  to f/u any worsening symptoms or concerns .

## 2014-03-12 NOTE — Assessment & Plan Note (Signed)
stable overall by history and exam, recent data reviewed with pt, and pt to continue medical treatment as before,  to f/u any worsening symptoms or concerns BP Readings from Last 3 Encounters:  03/12/14 116/74  09/06/13 118/72  06/05/13 122/82

## 2014-03-12 NOTE — Progress Notes (Signed)
Subjective:    Patient ID: Stephen Wilkins, male    DOB: [birth year], 80 y.o.   MRN: [id number]  HPI   Here to f/u; overall doing ok,  Pt denies chest pain, increased sob or doe, wheezing, orthopnea, PND, increased LE swelling, palpitations, dizziness or syncope.  Pt denies polydipsia, polyuria, or low sugar symptoms such as weakness or confusion improved with po intake.  Pt denies new neurological symptoms such as new headache, or facial or extremity weakness or numbness.   Pt states overall good compliance with meds, has been trying to follow lower cholesterol diet, with wt overall stable,  but little exercise however.   Also with itching to back of head, topicort helps but keeps coming back.  Tyring to stay active, goes to gym 4-5 times per wk.  Cant seem to lose wt. No new complaints.  Had had intol to lipitor and crestor, but willing to try a third statin. Denies urinary symptoms such as dysuria, frequency, urgency, flank pain, hematuria or n/v, fever, chills, but had elev psa last visit, asks for re-check today despite his age. Wt up 5 lbs since last visit, denies change in diet or activity.  TSH had been slightly increased last visit. Denies hyper or hypo thyroid symptoms such as voice, skin or hair change. Dementia overall stable symptomatically, and not assoc with behavioral changes such as hallucinations, paranoia, or agitation. Past Medical History  Diagnosis Date  . ABNORMAL ELECTROCARDIOGRAM 06/21/2007  . ALLERGIC RHINITIS 03/23/2007  . ASTHMATIC BRONCHITIS, ACUTE 04/27/2007  . BENIGN PROSTATIC HYPERTROPHY 03/23/2007  . BRONCHITIS NOT SPECIFIED AS ACUTE OR CHRONIC 04/21/2007  . BURSITIS, RIGHT HIP 07/31/2008  . Cough 01/29/2009  . ERECTILE DYSFUNCTION, ORGANIC 04/17/2009  . ERECTILE DYSFUNCTION 03/23/2007  . FREQUENCY, URINARY 04/26/2010  . GERD 03/23/2007  . HIATAL HERNIA   . HYPERLIPIDEMIA 03/23/2007  . HYPERTENSION 03/23/2007  . MILD COGNITIVE IMPAIRMENT SO STATED 05/19/2010  .  NEPHROLITHIASIS, HX OF 03/23/2007  . PSA, INCREASED 06/27/2008  . RASH-NONVESICULAR 03/23/2007  . REACTIVE AIRWAY DISEASE 01/14/2010  . SCHATZKI'S RING   . SCIATICA, RIGHT 04/28/2008  . Wheezing 04/17/2009  . Dementia 09/01/2010  . Anxiety state, unspecified 09/06/2013  . Unspecified hypothyroidism 09/06/2013   Past Surgical History  Procedure Laterality Date  . Tonsillectomy      reports that he has quit smoking. He has never used smokeless tobacco. He reports that he does not drink alcohol or use illicit drugs. family history includes Cancer in his brother and another family member; Emphysema in his brother; Hypertension in an other family member. Allergies  Allergen Reactions  . Atorvastatin     REACTION: myalgia  . Doxazosin Mesylate     REACTION: dizziness   Current Outpatient Prescriptions on File Prior to Visit  Medication Sig Dispense Refill  . albuterol (PROVENTIL HFA;VENTOLIN HFA) 108 (90 BASE) MCG/ACT inhaler Inhale 2 puffs into the lungs every 6 (six) hours as needed for wheezing. 1 Inhaler 11  . ALPRAZolam (XANAX) 0.5 MG tablet Take 1 tablet (0.5 mg total) by mouth daily as needed for anxiety. 30 tablet 2  . atorvastatin (LIPITOR) 40 MG tablet Take 1 tablet (40 mg total) by mouth daily. 90 tablet 3  . beclomethasone (QVAR) 80 MCG/ACT inhaler Inhale 1 puff into the lungs 2 (two) times daily. 1 Inhaler 12  . Cholecalciferol (VITAMIN D) 1000 UNITS capsule Take 1,000 Units by mouth daily.      Marland Kitchen desoximetasone (TOPICORT) 0.25 % cream apply topically to affected  area twice a day as needed 30 g 0  . lansoprazole (PREVACID) 30 MG capsule take 1 capsule by mouth once daily 30 capsule 5  . levothyroxine (SYNTHROID, LEVOTHROID) 25 MCG tablet Take 1 tablet (25 mcg total) by mouth daily before breakfast. 90 tablet 3  . meclizine (ANTIVERT) 12.5 MG tablet Take 1 tablet (12.5 mg total) by mouth 3 (three) times daily as needed. 30 tablet 1  . Multiple Vitamins-Minerals (CENTRUM SILVER PO)  Take by mouth daily.      . Omega-3 Fatty Acids (FISH OIL) 1200 MG CAPS Take by mouth daily.      . polyethylene glycol powder (MIRALAX) powder Take 1/2 capful by mouth once daily 1 g 0  . tamsulosin (FLOMAX) 0.4 MG CAPS capsule take 1 capsule by mouth once daily 90 capsule 3  . valsartan (DIOVAN) 80 MG tablet Take 1 tablet (80 mg total) by mouth daily. 90 tablet 3   No current facility-administered medications on file prior to visit.   Review of Systems  Constitutional: Negative for unusual diaphoresis or other sweats  HENT: Negative for ringing in ear Eyes: Negative for double vision or worsening visual disturbance.  Respiratory: Negative for choking and stridor.   Gastrointestinal: Negative for vomiting or other signifcant bowel change Genitourinary: Negative for hematuria or decreased urine volume.  Musculoskeletal: Negative for other MSK pain or swelling Skin: Negative for color change and worsening wound.  Neurological: Negative for tremors and numbness other than noted  Psychiatric/Behavioral: Negative for decreased concentration or agitation other than above       Objective:   Physical Exam BP 116/74 mmHg  Pulse 68  Temp(Src) 97.6 F (36.4 C) (Oral)  Ht 6' (1.829 m)  Wt 207 lb (93.895 kg)  BMI 28.07 kg/m2  SpO2 96% VS noted,  Constitutional: Pt appears well-developed, well-nourished.  HENT: Head: NCAT.  Right Ear: External ear normal.  Left Ear: External ear normal.  Eyes: . Pupils are equal, round, and reactive to light. Conjunctivae and EOM are normal Neck: Normal range of motion. Neck supple.  Cardiovascular: Normal rate and regular rhythm.   Pulmonary/Chest: Effort normal and breath sounds normal.  Abd:  Soft, NT, ND, + BS Neurological: Pt is alert. Mild chronic confused , motor grossly intact Skin: Skin is warm. No rash Psychiatric: Pt behavior is normal. No agitation.  Wt Readings from Last 3 Encounters:  03/12/14 207 lb (93.895 kg)  09/06/13 202 lb 8 oz  (91.853 kg)  06/05/13 202 lb (91.627 kg)       Assessment & Plan:

## 2014-03-12 NOTE — Assessment & Plan Note (Signed)
stable overall by history and exam, , and pt to continue medical treatment as before,  to f/u any worsening symptoms or concerns  

## 2014-03-12 NOTE — Progress Notes (Signed)
Pre visit review using our clinic review tool, if applicable. No additional management support is needed unless otherwise documented below in the visit note. 

## 2014-03-12 NOTE — Assessment & Plan Note (Signed)
Requests f/u psa, though he understands the risk of needed to see urology if persistently elevated

## 2014-03-21 ENCOUNTER — Encounter: Payer: Self-pay | Admitting: Family

## 2014-03-21 ENCOUNTER — Ambulatory Visit (INDEPENDENT_AMBULATORY_CARE_PROVIDER_SITE_OTHER): Payer: Medicare Other | Admitting: Family

## 2014-03-21 VITALS — BP 110/70 | HR 76 | Temp 98.0°F | Resp 18 | Ht 72.0 in | Wt 208.4 lb

## 2014-03-21 DIAGNOSIS — J069 Acute upper respiratory infection, unspecified: Secondary | ICD-10-CM

## 2014-03-21 MED ORDER — BECLOMETHASONE DIPROPIONATE 80 MCG/ACT IN AERS: 1.0000 | INHALATION_SPRAY | Freq: Two times a day (BID) | RESPIRATORY_TRACT | Status: DC

## 2014-03-21 NOTE — Assessment & Plan Note (Signed)
Symptoms and exam consistent with resolving acute upper respiratory infection. Continue over-the-counter medications as needed for symptom relief and supportive care. Follow up if symptoms worsen or fail to improve.

## 2014-03-21 NOTE — Progress Notes (Signed)
Subjective:    Patient ID: Stephen Wilkins, male    DOB: April 30, 1927, 80 y.o.   MRN: [id number]  Chief Complaint  Patient presents with  . Cough    cough, sore throat, chills, x1 week no other symptoms    HPI:  Stephen Wilkins is a 80 y.o. male who presents today for an acute visit  Acute symptoms of cough, sore throat and chills that have been going on for about a week. Denies any fevers. Indicates the cough has improved some over the course of the week. Denies sneezing or watery eyes. Denies shortness of breath or chest tightness.   Allergies  Allergen Reactions  . Atorvastatin     REACTION: myalgia  . Crestor [Rosuvastatin] Other (See Comments)    fatigue  . Doxazosin Mesylate     REACTION: dizziness    Current Outpatient Prescriptions on File Prior to Visit  Medication Sig Dispense Refill  . ALPRAZolam (XANAX) 0.5 MG tablet Take 1 tablet (0.5 mg total) by mouth daily as needed for anxiety. 30 tablet 2  . beclomethasone (QVAR) 80 MCG/ACT inhaler Inhale 1 puff into the lungs 2 (two) times daily. 1 Inhaler 12  . Cholecalciferol (VITAMIN D) 1000 UNITS capsule Take 1,000 Units by mouth daily.      Marland Kitchen desoximetasone (TOPICORT) 0.25 % cream apply topically to affected area twice a day as needed 30 g 0  . lansoprazole (PREVACID) 30 MG capsule take 1 capsule by mouth once daily 30 capsule 5  . levothyroxine (SYNTHROID, LEVOTHROID) 75 MCG tablet Take 1 tablet (75 mcg total) by mouth daily. 90 tablet 3  . meclizine (ANTIVERT) 12.5 MG tablet Take 1 tablet (12.5 mg total) by mouth 3 (three) times daily as needed. 30 tablet 1  . Multiple Vitamins-Minerals (CENTRUM SILVER PO) Take by mouth daily.      . Omega-3 Fatty Acids (FISH OIL) 1200 MG CAPS Take by mouth daily.      . polyethylene glycol powder (MIRALAX) powder Take 1/2 capful by mouth once daily 1 g 0  . pravastatin (PRAVACHOL) 40 MG tablet Take 1 tablet (40 mg total) by mouth daily. 90 tablet 3  . tamsulosin (FLOMAX) 0.4 MG CAPS  capsule take 1 capsule by mouth once daily 90 capsule 3  . valsartan (DIOVAN) 80 MG tablet Take 1 tablet (80 mg total) by mouth daily. 90 tablet 3  . albuterol (PROVENTIL HFA;VENTOLIN HFA) 108 (90 BASE) MCG/ACT inhaler Inhale 2 puffs into the lungs every 6 (six) hours as needed for wheezing. 1 Inhaler 11   No current facility-administered medications on file prior to visit.    Review of Systems    See HPI  Objective:    BP 110/70 mmHg  Pulse 76  Temp(Src) 98 F (36.7 C) (Oral)  Resp 18  Ht 6' (1.829 m)  Wt 208 lb 6.4 oz (94.53 kg)  BMI 28.26 kg/m2  SpO2 95% Nursing note and vital signs reviewed.  Physical Exam  Constitutional: He is oriented to person, place, and time. He appears well-developed and well-nourished. No distress.  HENT:  Right Ear: Hearing, tympanic membrane, external ear and ear canal normal.  Left Ear: Hearing, tympanic membrane, external ear and ear canal normal.  Nose: Nose normal. Right sinus exhibits no maxillary sinus tenderness and no frontal sinus tenderness. Left sinus exhibits no maxillary sinus tenderness and no frontal sinus tenderness.  Mouth/Throat: Uvula is midline, oropharynx is clear and moist and mucous membranes are normal.  Cardiovascular: Normal rate,  regular rhythm, normal heart sounds and intact distal pulses.   Pulmonary/Chest: Effort normal and breath sounds normal.  Lymphadenopathy:    He has no cervical adenopathy.  Neurological: He is alert and oriented to person, place, and time.  Skin: Skin is warm and dry.  Psychiatric: He has a normal mood and affect. His behavior is normal. Judgment and thought content normal.       Assessment & Plan:

## 2014-03-21 NOTE — Progress Notes (Signed)
Pre visit review using our clinic review tool, if applicable. No additional management support is needed unless otherwise documented below in the visit note. 

## 2014-03-21 NOTE — Patient Instructions (Addendum)
Thank you for choosing [facility].  Summary/Instructions:  Your prescription(s) have been submitted to your pharmacy. Please take as directed and contact our office if you believe you are having problem(s) with the medication(s).  If your symptoms worsen or fail to improve, please contact our office for further instruction, or in case of emergency go directly to the emergency room at the closest medical facility.    Upper Respiratory Infection, Adult An upper respiratory infection (URI) is also sometimes known as the common cold. The upper respiratory tract includes the nose, sinuses, throat, trachea, and bronchi. Bronchi are the airways leading to the lungs. Most people improve within 1 week, but symptoms can last up to 2 weeks. A residual cough may last even longer.  CAUSES Many different viruses can infect the tissues lining the upper respiratory tract. The tissues become irritated and inflamed and often become very moist. Mucus production is also common. A cold is contagious. You can easily spread the virus to others by oral contact. This includes kissing, sharing a glass, coughing, or sneezing. Touching your mouth or nose and then touching a surface, which is then touched by another person, can also spread the virus. SYMPTOMS  Symptoms typically develop 1 to 3 days after you come in contact with a cold virus. Symptoms vary from person to person. They may include:  Runny nose.  Sneezing.  Nasal congestion.  Sinus irritation.  Sore throat.  Loss of voice (laryngitis).  Cough.  Fatigue.  Muscle aches.  Loss of appetite.  Headache.  Low-grade fever. DIAGNOSIS  You might diagnose your own cold based on familiar symptoms, since most people get a cold 2 to 3 times a year. Your caregiver can confirm this based on your exam. Most importantly, your caregiver can check that your symptoms are not due to another disease such as strep throat, sinusitis, pneumonia, asthma, or  epiglottitis. Blood tests, throat tests, and X-rays are not necessary to diagnose a common cold, but they may sometimes be helpful in excluding other more serious diseases. Your caregiver will decide if any further tests are required. RISKS AND COMPLICATIONS  You may be at risk for a more severe case of the common cold if you smoke cigarettes, have chronic heart disease (such as heart failure) or lung disease (such as asthma), or if you have a weakened immune system. The very young and very old are also at risk for more serious infections. Bacterial sinusitis, middle ear infections, and bacterial pneumonia can complicate the common cold. The common cold can worsen asthma and chronic obstructive pulmonary disease (COPD). Sometimes, these complications can require emergency medical care and may be life-threatening. PREVENTION  The best way to protect against getting a cold is to practice good hygiene. Avoid oral or hand contact with people with cold symptoms. Wash your hands often if contact occurs. There is no clear evidence that vitamin C, vitamin E, echinacea, or exercise reduces the chance of developing a cold. However, it is always recommended to get plenty of rest and practice good nutrition. TREATMENT  Treatment is directed at relieving symptoms. There is no cure. Antibiotics are not effective, because the infection is caused by a virus, not by bacteria. Treatment may include:  Increased fluid intake. Sports drinks offer valuable electrolytes, sugars, and fluids.  Breathing heated mist or steam (vaporizer or shower).  Eating chicken soup or other clear broths, and maintaining good nutrition.  Getting plenty of rest.  Using gargles or lozenges for comfort.  Controlling fevers  with ibuprofen or acetaminophen as directed by your caregiver.  Increasing usage of your inhaler if you have asthma. Zinc gel and zinc lozenges, taken in the first 24 hours of the common cold, can shorten the duration  and lessen the severity of symptoms. Pain medicines may help with fever, muscle aches, and throat pain. A variety of non-prescription medicines are available to treat congestion and runny nose. Your caregiver can make recommendations and may suggest nasal or lung inhalers for other symptoms.  HOME CARE INSTRUCTIONS   Only take over-the-counter or prescription medicines for pain, discomfort, or fever as directed by your caregiver.  Use a warm mist humidifier or inhale steam from a shower to increase air moisture. This may keep secretions moist and make it easier to breathe.  Drink enough water and fluids to keep your urine clear or pale yellow.  Rest as needed.  Return to work when your temperature has returned to normal or as your caregiver advises. You may need to stay home longer to avoid infecting others. You can also use a face mask and careful hand washing to prevent spread of the virus. SEEK MEDICAL CARE IF:   After the first few days, you feel you are getting worse rather than better.  You need your caregiver's advice about medicines to control symptoms.  You develop chills, worsening shortness of breath, or [place] or red sputum. These may be signs of pneumonia.  You develop yellow or Fout nasal discharge or pain in the face, especially when you bend forward. These may be signs of sinusitis.  You develop a fever, swollen neck glands, pain with swallowing, or white areas in the back of your throat. These may be signs of strep throat. SEEK IMMEDIATE MEDICAL CARE IF:   You have a fever.  You develop severe or persistent headache, ear pain, sinus pain, or chest pain.  You develop wheezing, a prolonged cough, cough up blood, or have a change in your usual mucus (if you have chronic lung disease).  You develop sore muscles or a stiff neck. Document Released: 09/14/2000 Document Revised: 06/13/2011 Document Reviewed: 06/26/2013 [facility] Patient Information 2015 [place], [place]. This  information is not intended to replace advice given to you by your health care provider. Make sure you discuss any questions you have with your health care provider.

## 2014-06-20 ENCOUNTER — Ambulatory Visit: Payer: Medicare Other | Admitting: Internal Medicine

## 2014-06-20 ENCOUNTER — Encounter: Payer: Self-pay | Admitting: Internal Medicine

## 2014-06-20 ENCOUNTER — Ambulatory Visit (INDEPENDENT_AMBULATORY_CARE_PROVIDER_SITE_OTHER): Payer: PPO | Admitting: Internal Medicine

## 2014-06-20 VITALS — BP 120/70 | HR 73 | Temp 97.8°F | Resp 18 | Ht 72.0 in | Wt 206.0 lb

## 2014-06-20 DIAGNOSIS — I10 Essential (primary) hypertension: Secondary | ICD-10-CM

## 2014-06-20 DIAGNOSIS — F0390 Unspecified dementia without behavioral disturbance: Secondary | ICD-10-CM

## 2014-06-20 DIAGNOSIS — M5431 Sciatica, right side: Secondary | ICD-10-CM

## 2014-06-20 MED ORDER — METHYLPREDNISOLONE ACETATE 80 MG/ML IJ SUSP
80.0000 mg | Freq: Once | INTRAMUSCULAR | Status: AC
  Administered 2014-06-20: 80 mg via INTRAMUSCULAR

## 2014-06-20 MED ORDER — LANSOPRAZOLE 30 MG PO CPDR: 30.0000 mg | DELAYED_RELEASE_CAPSULE | Freq: Every day | ORAL | Status: DC

## 2014-06-20 NOTE — Patient Instructions (Signed)
You had the steroid shot today  Please continue all other medications as before, and refills have been done if requested.  Please have the pharmacy call with any other refills you may need.  Please continue your efforts at being more active, low cholesterol diet, and weight control.  Please keep your appointments with your specialists as you may have planned     

## 2014-06-20 NOTE — Progress Notes (Signed)
Subjective:    Patient ID: Stephen Wilkins, male    DOB: 10/26/1927, 81 y.o.   MRN: [id number]  HPI  Here to f/u, Pt c/o recurring 5 days right LBP without change in severity, mild intermittent with radiation to the right calf, but no bowel or bladder change, fever, wt loss,  worsening LE numbness/weakness, gait change or falls.  Has occurred several times in the past, last time with improvement with depomedrol, which is he is asking.  Pt denies chest pain, increased sob or doe, wheezing, orthopnea, PND, increased LE swelling, palpitations, dizziness or syncope.  Pt denies new neurological symptoms such as new headache, or facial or extremity weakness or numbness   Pt denies polydipsia, polyuria  Dementia overall stable symptomatically, and not assoc with behavioral changes such as hallucinations, paranoia, or agitation.  Past Medical History  Diagnosis Date  . ABNORMAL ELECTROCARDIOGRAM 06/21/2007  . ALLERGIC RHINITIS 03/23/2007  . ASTHMATIC BRONCHITIS, ACUTE 04/27/2007  . BENIGN PROSTATIC HYPERTROPHY 03/23/2007  . BRONCHITIS NOT SPECIFIED AS ACUTE OR CHRONIC 04/21/2007  . BURSITIS, RIGHT HIP 07/31/2008  . Cough 01/29/2009  . ERECTILE DYSFUNCTION, ORGANIC 04/17/2009  . ERECTILE DYSFUNCTION 03/23/2007  . FREQUENCY, URINARY 04/26/2010  . GERD 03/23/2007  . HIATAL HERNIA   . HYPERLIPIDEMIA 03/23/2007  . HYPERTENSION 03/23/2007  . MILD COGNITIVE IMPAIRMENT SO STATED 05/19/2010  . NEPHROLITHIASIS, HX OF 03/23/2007  . PSA, INCREASED 06/27/2008  . RASH-NONVESICULAR 03/23/2007  . REACTIVE AIRWAY DISEASE 01/14/2010  . SCHATZKI'S RING   . SCIATICA, RIGHT 04/28/2008  . Wheezing 04/17/2009  . Dementia 09/01/2010  . Anxiety state, unspecified 09/06/2013  . Unspecified hypothyroidism 09/06/2013   Past Surgical History  Procedure Laterality Date  . Tonsillectomy      reports that he has quit smoking. He has never used smokeless tobacco. He reports that he does not drink alcohol or use illicit drugs. family  history includes Cancer in his brother and another family member; Emphysema in his brother; Hypertension in an other family member. Allergies  Allergen Reactions  . Atorvastatin     REACTION: myalgia  . Crestor [Rosuvastatin] Other (See Comments)    fatigue  . Doxazosin Mesylate     REACTION: dizziness   Current Outpatient Prescriptions on File Prior to Visit  Medication Sig Dispense Refill  . ALPRAZolam (XANAX) 0.5 MG tablet Take 1 tablet (0.5 mg total) by mouth daily as needed for anxiety. 30 tablet 2  . beclomethasone (QVAR) 80 MCG/ACT inhaler Inhale 1 puff into the lungs 2 (two) times daily. 1 Inhaler 12  . Cholecalciferol (VITAMIN D) 1000 UNITS capsule Take 1,000 Units by mouth daily.      Marland Kitchen desoximetasone (TOPICORT) 0.25 % cream apply topically to affected area twice a day as needed 30 g 0  . levothyroxine (SYNTHROID, LEVOTHROID) 75 MCG tablet Take 1 tablet (75 mcg total) by mouth daily. 90 tablet 3  . meclizine (ANTIVERT) 12.5 MG tablet Take 1 tablet (12.5 mg total) by mouth 3 (three) times daily as needed. 30 tablet 1  . Multiple Vitamins-Minerals (CENTRUM SILVER PO) Take by mouth daily.      . Omega-3 Fatty Acids (FISH OIL) 1200 MG CAPS Take by mouth daily.      . polyethylene glycol powder (MIRALAX) powder Take 1/2 capful by mouth once daily 1 g 0  . pravastatin (PRAVACHOL) 40 MG tablet Take 1 tablet (40 mg total) by mouth daily. 90 tablet 3  . tamsulosin (FLOMAX) 0.4 MG CAPS capsule take 1 capsule by  mouth once daily 90 capsule 3  . valsartan (DIOVAN) 80 MG tablet Take 1 tablet (80 mg total) by mouth daily. 90 tablet 3  . albuterol (PROVENTIL HFA;VENTOLIN HFA) 108 (90 BASE) MCG/ACT inhaler Inhale 2 puffs into the lungs every 6 (six) hours as needed for wheezing. 1 Inhaler 11   No current facility-administered medications on file prior to visit.   Review of Systems  Constitutional: Negative for unusual diaphoresis or night sweats HENT: Negative for ringing in ear or  discharge Eyes: Negative for double vision or worsening visual disturbance.  Respiratory: Negative for choking and stridor.   Gastrointestinal: Negative for vomiting or other signifcant bowel change Genitourinary: Negative for hematuria or change in urine volume.  Musculoskeletal: Negative for other MSK pain or swelling Skin: Negative for color change and worsening wound.  Neurological: Negative for tremors and numbness other than noted  Psychiatric/Behavioral: Negative for decreased concentration or agitation other than above       Objective:   Physical Exam BP 120/70 mmHg  Pulse 73  Temp(Src) 97.8 F (36.6 C) (Oral)  Resp 18  Ht 6' (1.829 m)  Wt 206 lb (93.441 kg)  BMI 27.93 kg/m2  SpO2 93% VS noted, not ill appearing Constitutional: Pt appears in no significant distress HENT: Head: NCAT.  Right Ear: External ear normal.  Left Ear: External ear normal.  Eyes: . Pupils are equal, round, and reactive to light. Conjunctivae and EOM are normal Neck: Normal range of motion. Neck supple.  Cardiovascular: Normal rate and regular rhythm.   Pulmonary/Chest: Effort normal and breath sounds without rales or wheezing.  Spine nontender, but with mild right lumbar paravertebral tender without swelling or rash Abd:  Soft, NT, ND, + BS Neurological: Pt is alert. Not confused , motor 5/5 intact, gait steady, DTR intact Skin: Skin is warm. No rash, no LE edema Psychiatric: Pt behavior is normal. No agitation.     Assessment & Plan:

## 2014-06-21 NOTE — Assessment & Plan Note (Signed)
stable overall by history and exam, recent data reviewed with pt, and pt to continue medical treatment as before,  to f/u any worsening symptoms or concerns BP Readings from Last 3 Encounters:  06/20/14 120/70  03/21/14 110/70  03/12/14 116/74

## 2014-06-21 NOTE — Assessment & Plan Note (Signed)
Mild recurrent x 5 days, ok for depomedrol IM, declines other tx such as pain med at this time, exam stable,  to f/u any worsening symptoms or concerns

## 2014-06-21 NOTE — Assessment & Plan Note (Signed)
stable overall by history and exam, and pt to continue medical treatment as before,  to f/u any worsening symptoms or concerns 

## 2014-07-07 ENCOUNTER — Other Ambulatory Visit: Payer: Self-pay | Admitting: Orthopaedic Surgery

## 2014-07-07 DIAGNOSIS — M545 Low back pain: Secondary | ICD-10-CM

## 2014-07-12 ENCOUNTER — Ambulatory Visit
Admission: RE | Admit: 2014-07-12 | Discharge: 2014-07-12 | Disposition: A | Discharge status: Home / Self Care | Payer: PPO | Source: Ambulatory Visit | Attending: Orthopaedic Surgery | Admitting: Orthopaedic Surgery

## 2014-07-12 DIAGNOSIS — M545 Low back pain: Secondary | ICD-10-CM

## 2014-07-24 ENCOUNTER — Telehealth: Payer: Self-pay | Admitting: Internal Medicine

## 2014-07-24 MED ORDER — DICLOFENAC SODIUM 75 MG PO TBEC: 75.0000 mg | DELAYED_RELEASE_TABLET | Freq: Two times a day (BID) | ORAL | Status: DC

## 2014-07-24 NOTE — Telephone Encounter (Signed)
Patients shot has worn off from last office visit. Wife is asking for a script of diclofen ac 75 mg to be called in to [facility] on [address]

## 2014-07-24 NOTE — Telephone Encounter (Signed)
Notified pt rx sent to [facility]..lmb 

## 2014-07-24 NOTE — Telephone Encounter (Signed)
rx has been done erx

## 2014-07-29 ENCOUNTER — Encounter: Payer: Self-pay | Admitting: Internal Medicine

## 2014-07-29 ENCOUNTER — Ambulatory Visit (INDEPENDENT_AMBULATORY_CARE_PROVIDER_SITE_OTHER): Payer: PPO | Admitting: Internal Medicine

## 2014-07-29 ENCOUNTER — Other Ambulatory Visit (INDEPENDENT_AMBULATORY_CARE_PROVIDER_SITE_OTHER): Payer: PPO

## 2014-07-29 VITALS — BP 106/70 | HR 70 | Temp 97.8°F | Resp 18 | Ht 72.0 in | Wt 200.1 lb

## 2014-07-29 DIAGNOSIS — R310 Gross hematuria: Secondary | ICD-10-CM

## 2014-07-29 DIAGNOSIS — R1310 Dysphagia, unspecified: Secondary | ICD-10-CM

## 2014-07-29 DIAGNOSIS — I10 Essential (primary) hypertension: Secondary | ICD-10-CM

## 2014-07-29 DIAGNOSIS — F411 Generalized anxiety disorder: Secondary | ICD-10-CM | POA: Diagnosis not present

## 2014-07-29 LAB — CBC WITH DIFFERENTIAL/PLATELET
BASOS ABS: 0 10*3/uL (ref 0.0–0.1)
Basophils Relative: 0.4 % (ref 0.0–3.0)
Eosinophils Absolute: 0.2 10*3/uL (ref 0.0–0.7)
Eosinophils Relative: 3.3 % (ref 0.0–5.0)
HCT: 40.8 % (ref 39.0–52.0)
Hemoglobin: 13.8 g/dL (ref 13.0–17.0)
LYMPHS PCT: 17.4 % (ref 12.0–46.0)
Lymphs Abs: 1.1 10*3/uL (ref 0.7–4.0)
MCHC: 33.8 g/dL (ref 30.0–36.0)
MCV: 93.9 fl (ref 78.0–100.0)
Monocytes Absolute: 0.6 10*3/uL (ref 0.1–1.0)
Monocytes Relative: 9.7 % (ref 3.0–12.0)
NEUTROS ABS: 4.4 10*3/uL (ref 1.4–7.7)
NEUTROS PCT: 69.2 % (ref 43.0–77.0)
PLATELETS: 214 10*3/uL (ref 150.0–400.0)
RBC: 4.34 Mil/uL (ref 4.22–5.81)
RDW: 13.1 % (ref 11.5–15.5)
WBC: 6.3 10*3/uL (ref 4.0–10.5)

## 2014-07-29 LAB — HEPATIC FUNCTION PANEL
ALT: 218 U/L — ABNORMAL HIGH (ref 0–53)
AST: 85 U/L — ABNORMAL HIGH (ref 0–37)
Albumin: 3.9 g/dL (ref 3.5–5.2)
Alkaline Phosphatase: 134 U/L — ABNORMAL HIGH (ref 39–117)
BILIRUBIN TOTAL: 1.8 mg/dL — AB (ref 0.2–1.2)
Bilirubin, Direct: 0.7 mg/dL — ABNORMAL HIGH (ref 0.0–0.3)
TOTAL PROTEIN: 6.8 g/dL (ref 6.0–8.3)

## 2014-07-29 LAB — URINALYSIS, ROUTINE W REFLEX MICROSCOPIC
BILIRUBIN URINE: NEGATIVE
Hgb urine dipstick: NEGATIVE
Ketones, ur: NEGATIVE
Leukocytes, UA: NEGATIVE
Nitrite: NEGATIVE
RBC / HPF: NONE SEEN (ref 0–?)
Specific Gravity, Urine: 1.015 (ref 1.000–1.030)
Total Protein, Urine: NEGATIVE
URINE GLUCOSE: NEGATIVE
Urobilinogen, UA: 2 — AB (ref 0.0–1.0)
pH: 6.5 (ref 5.0–8.0)

## 2014-07-29 LAB — BASIC METABOLIC PANEL
BUN: 30 mg/dL — ABNORMAL HIGH (ref 6–23)
CHLORIDE: 100 meq/L (ref 96–112)
CO2: 32 mEq/L (ref 19–32)
Calcium: 9.5 mg/dL (ref 8.4–10.5)
Creatinine, Ser: 1.54 mg/dL — ABNORMAL HIGH (ref 0.40–1.50)
GFR: 45.93 mL/min — ABNORMAL LOW (ref >60.00)
Glucose, Bld: 79 mg/dL (ref 70–99)
POTASSIUM: 5 meq/L (ref 3.5–5.1)
Sodium: 137 mEq/L (ref 135–145)

## 2014-07-29 LAB — LIPID PANEL
CHOL/HDL RATIO: 4
Cholesterol: 215 mg/dL — ABNORMAL HIGH (ref 0–200)
HDL: 50.5 mg/dL (ref >39.00)
LDL CALC: 134 mg/dL — AB (ref 0–99)
NonHDL: 164.5
TRIGLYCERIDES: 152 mg/dL — AB (ref 0.0–149.0)
VLDL: 30.4 mg/dL (ref 0.0–40.0)

## 2014-07-29 LAB — TSH: TSH: 8.33 u[IU]/mL — ABNORMAL HIGH (ref 0.35–4.50)

## 2014-07-29 MED ORDER — CEFTRIAXONE SODIUM 1 G IJ SOLR
1.0000 g | Freq: Once | INTRAMUSCULAR | Status: AC
  Administered 2014-07-29: 1 g via INTRAMUSCULAR

## 2014-07-29 MED ORDER — ALPRAZOLAM 0.5 MG PO TABS: 0.5000 mg | ORAL_TABLET | Freq: Every day | ORAL | Status: DC | PRN

## 2014-07-29 MED ORDER — LANSOPRAZOLE 30 MG PO CPDR: 30.0000 mg | DELAYED_RELEASE_CAPSULE | Freq: Every day | ORAL | Status: DC

## 2014-07-29 MED ORDER — CIPROFLOXACIN HCL 500 MG PO TABS: 500.0000 mg | ORAL_TABLET | Freq: Two times a day (BID) | ORAL | Status: DC

## 2014-07-29 NOTE — Assessment & Plan Note (Signed)
stable overall by history and exam, recent data reviewed with pt, and pt to continue medical treatment as before,  to f/u any worsening symptoms or concerns . For med refill - xanax

## 2014-07-29 NOTE — Assessment & Plan Note (Signed)
stable overall by history and exam, recent data reviewed with pt, and pt to continue medical treatment as before,  to f/u any worsening symptoms or concerns BP Readings from Last 3 Encounters:  07/29/14 106/70  06/20/14 120/70  03/21/14 110/70

## 2014-07-29 NOTE — Assessment & Plan Note (Signed)
Likely recurrence of prior stricture symptomatic, cant r/o malignancy, for refer back to Dr Dennie Fetters, has appt in June but will see if can be sooner; also take the prevacid daily - encouraged compliance

## 2014-07-29 NOTE — Patient Instructions (Addendum)
You had the antibiotic shot today  Please take all new medication as prescribed - the antibiotic pills (cipro)  Please continue all other medications as before, including the Prevacid every day  Your xanax was refilled  Please have the pharmacy call with any other refills you may need.  You will be contacted regarding the referral for: GI - Dr Olevia Perches, hopefully sooner than June 2016 appt  Please keep your appointments with your specialists as you may have planned - including Dr Nesi/urology in May 2016  Please go to the LAB in the Basement (turn left off the elevator) for the tests to be done today  You will be contacted by phone if any changes need to be made immediately.  Otherwise, you will receive a letter about your results with an explanation, but please check with MyChart first.  Please remember to sign up for MyChart if you have not done so, as this will be important to you in the future with finding out test results, communicating by private email, and scheduling acute appointments online when needed.  Please return in 6 months, or sooner if needed

## 2014-07-29 NOTE — Assessment & Plan Note (Signed)
With weakness, gait difficulty, chills x 2-3 days- suspect prob UT_, cant r/o malignancy, for urine studies, also rocephin IM today, then cipro course,, has fu appt scheduled already with Dr Janice Norrie early May - to keep that appt

## 2014-07-29 NOTE — Progress Notes (Signed)
Subjective:    Patient ID: Stephen Wilkins, male    DOB: Mar 29, 1928, 81 y.o.   MRN: [id number]  HPI  Had episode of dysphagia to solids twice over the weekend, required vomiting to relieve lower sternal pain that occurred after ate. Has hx of esoph stricture with dilation per dr Olevia Perches 2008.  O/w Pt denies chest pain, increased sob or doe, wheezing, orthopnea, PND, increased LE swelling, palpitations, dizziness or syncope. Does not take prevacid daily  Has been staggery with balance worse for some reason in the past 2-3 days.  Pt denies fever, wt loss, night sweats, loss of appetite, or other constitutional symptoms; has had some chills Urine was orange/red last night x 1, seemed better today. Denies urinary symptoms such as dysuria, frequency, urgency, flank pain, or n/v, fever.  Has f/u appt with Dr Nesi/urology next month.  Not on anticoagulant  Pt continues to have recurring LBP without change in severity, bowel or bladder change, fever, wt loss,  worsening LE pain/numbness/weakness, gait change or falls, has seen [facility], ? Bursitis right hip, referred to Dr Myrtis Hopping group for cortisone right hip, also for [facility] tomorrow as well.  DId have MRI lumbar with disc dz and DJD  .  Has been also using ibuprofen prn, and heat/cold but not working out so far.   Denies worsening depressive symptoms, suicidal ideation, or panic; has ongoing anxiety, not increased recently., asks for med refill. Past Medical History  Diagnosis Date  . ABNORMAL ELECTROCARDIOGRAM 06/21/2007  . ALLERGIC RHINITIS 03/23/2007  . ASTHMATIC BRONCHITIS, ACUTE 04/27/2007  . BENIGN PROSTATIC HYPERTROPHY 03/23/2007  . BRONCHITIS NOT SPECIFIED AS ACUTE OR CHRONIC 04/21/2007  . BURSITIS, RIGHT HIP 07/31/2008  . Cough 01/29/2009  . ERECTILE DYSFUNCTION, ORGANIC 04/17/2009  . ERECTILE DYSFUNCTION 03/23/2007  . FREQUENCY, URINARY 04/26/2010  . GERD 03/23/2007  . HIATAL HERNIA   . HYPERLIPIDEMIA 03/23/2007  . HYPERTENSION  03/23/2007  . MILD COGNITIVE IMPAIRMENT SO STATED 05/19/2010  . NEPHROLITHIASIS, HX OF 03/23/2007  . PSA, INCREASED 06/27/2008  . RASH-NONVESICULAR 03/23/2007  . REACTIVE AIRWAY DISEASE 01/14/2010  . SCHATZKI'S RING   . SCIATICA, RIGHT 04/28/2008  . Wheezing 04/17/2009  . Dementia 09/01/2010  . Anxiety state, unspecified 09/06/2013  . Unspecified hypothyroidism 09/06/2013   Past Surgical History  Procedure Laterality Date  . Tonsillectomy      reports that he has quit smoking. He has never used smokeless tobacco. He reports that he does not drink alcohol or use illicit drugs. family history includes Cancer in his brother and another family member; Emphysema in his brother; Hypertension in an other family member. Allergies  Allergen Reactions  . Atorvastatin     REACTION: myalgia  . Crestor [Rosuvastatin] Other (See Comments)    fatigue  . Doxazosin Mesylate     REACTION: dizziness   Current Outpatient Prescriptions on File Prior to Visit  Medication Sig Dispense Refill  . beclomethasone (QVAR) 80 MCG/ACT inhaler Inhale 1 puff into the lungs 2 (two) times daily. 1 Inhaler 12  . Cholecalciferol (VITAMIN D) 1000 UNITS capsule Take 1,000 Units by mouth daily.      Marland Kitchen desoximetasone (TOPICORT) 0.25 % cream apply topically to affected area twice a day as needed 30 g 0  . diclofenac (VOLTAREN) 75 MG EC tablet Take 1 tablet (75 mg total) by mouth 2 (two) times daily. As needed 60 tablet 0  . lansoprazole (PREVACID) 30 MG capsule Take 1 capsule (30 mg total) by mouth daily. 90 capsule  3  . levothyroxine (SYNTHROID, LEVOTHROID) 75 MCG tablet Take 1 tablet (75 mcg total) by mouth daily. 90 tablet 3  . meclizine (ANTIVERT) 12.5 MG tablet Take 1 tablet (12.5 mg total) by mouth 3 (three) times daily as needed. 30 tablet 1  . Multiple Vitamins-Minerals (CENTRUM SILVER PO) Take by mouth daily.      . Omega-3 Fatty Acids (FISH OIL) 1200 MG CAPS Take by mouth daily.      . polyethylene glycol powder  (MIRALAX) powder Take 1/2 capful by mouth once daily 1 g 0  . pravastatin (PRAVACHOL) 40 MG tablet Take 1 tablet (40 mg total) by mouth daily. 90 tablet 3  . tamsulosin (FLOMAX) 0.4 MG CAPS capsule take 1 capsule by mouth once daily 90 capsule 3  . valsartan (DIOVAN) 80 MG tablet Take 1 tablet (80 mg total) by mouth daily. 90 tablet 3  . albuterol (PROVENTIL HFA;VENTOLIN HFA) 108 (90 BASE) MCG/ACT inhaler Inhale 2 puffs into the lungs every 6 (six) hours as needed for wheezing. 1 Inhaler 11   No current facility-administered medications on file prior to visit.    Review of Systems  Constitutional: Negative for unusual diaphoresis or night sweats HENT: Negative for ringing in ear or discharge Eyes: Negative for double vision or worsening visual disturbance.  Respiratory: Negative for choking and stridor.   Gastrointestinal: Negative for vomiting or other signifcant bowel change Genitourinary: Negative for hematuria or change in urine volume.  Musculoskeletal: Negative for other MSK pain or swelling Skin: Negative for color change and worsening wound.  Neurological: Negative for tremors and numbness other than noted  Psychiatric/Behavioral: Negative for decreased concentration or agitation other than above       Objective:   Physical Exam BP 106/70 mmHg  Pulse 70  Temp(Src) 97.8 F (36.6 C) (Oral)  Resp 18  Ht 6' (1.829 m)  Wt 200 lb 1.3 oz (90.756 kg)  BMI 27.13 kg/m2  SpO2 94% VS noted,  Mild ill appearing, fatigued Constitutional: Pt appears in no significant distress HENT: Head: NCAT.  Right Ear: External ear normal.  Left Ear: External ear normal.  Eyes: . Pupils are equal, round, and reactive to light. Conjunctivae and EOM are normal Neck: Normal range of motion. Neck supple.  Cardiovascular: Normal rate and regular rhythm.   Pulmonary/Chest: Effort normal and breath sounds without rales or wheezing.  Abd:  Soft, NT, ND, + BS - benign exam Neurological: Pt is alert. Not  confused , motor grossly intact Skin: Skin is warm. No rash, no LE edema Psychiatric: Pt behavior is normal. No agitation.  Lab Results  Component Value Date   WBC 7.0 12/05/2012   HGB 13.8 12/05/2012   HCT 40.2 12/05/2012   PLT 222.0 12/05/2012   GLUCOSE 89 09/06/2013   CHOL 183 03/12/2014   TRIG 267.0* 03/12/2014   HDL 47.80 03/12/2014   LDLDIRECT 110.5 03/12/2014   LDLCALC 156* 09/06/2013   ALT 17 12/05/2012   AST 21 12/05/2012   NA 140 09/06/2013   K 4.9 09/06/2013   CL 104 09/06/2013   CREATININE 1.4 09/06/2013   BUN 22 09/06/2013   CO2 32 09/06/2013   TSH 9.15* 03/12/2014   PSA 3.94 03/12/2014       Assessment & Plan:

## 2014-07-30 ENCOUNTER — Other Ambulatory Visit: Payer: Self-pay | Admitting: Internal Medicine

## 2014-07-30 DIAGNOSIS — R7989 Other specified abnormal findings of blood chemistry: Secondary | ICD-10-CM

## 2014-07-30 DIAGNOSIS — R945 Abnormal results of liver function studies: Secondary | ICD-10-CM

## 2014-07-31 ENCOUNTER — Telehealth: Payer: Self-pay | Admitting: Internal Medicine

## 2014-07-31 LAB — URINE CULTURE
COLONY COUNT: NO GROWTH
ORGANISM ID, BACTERIA: NO GROWTH

## 2014-07-31 NOTE — Telephone Encounter (Signed)
Patient was in on Tuesday and was referred to get an endoscopy and he has an appointment for consultation. This morning he got a call from [facility] wanting to do a stomach scan. He doesn't think he is supposed to get that done. Please call patient to verify this.

## 2014-07-31 NOTE — Telephone Encounter (Signed)
Spoke with patient's wife this morning about the ultrasound that was ordered. Also informed her of the lab results that resulted in the ultrasound being ordered for his liver. She inquired about an endoscopy appointment, which I did not see an appointment scheduled for.

## 2014-08-15 ENCOUNTER — Telehealth: Payer: Self-pay | Admitting: Internal Medicine

## 2014-08-15 ENCOUNTER — Encounter: Payer: Self-pay | Admitting: Gastroenterology

## 2014-08-15 NOTE — Telephone Encounter (Signed)
Per [place] at [place] pt doesn't want done

## 2014-08-19 ENCOUNTER — Ambulatory Visit (INDEPENDENT_AMBULATORY_CARE_PROVIDER_SITE_OTHER): Payer: PPO | Admitting: Physician Assistant

## 2014-08-19 ENCOUNTER — Encounter: Payer: Self-pay | Admitting: Physician Assistant

## 2014-08-19 VITALS — BP 102/70 | HR 68 | Ht 72.0 in | Wt 202.5 lb

## 2014-08-19 DIAGNOSIS — R1314 Dysphagia, pharyngoesophageal phase: Secondary | ICD-10-CM | POA: Diagnosis not present

## 2014-08-19 DIAGNOSIS — Z8719 Personal history of other diseases of the digestive system: Secondary | ICD-10-CM | POA: Diagnosis not present

## 2014-08-19 NOTE — Addendum Note (Signed)
Addended by: Nicoletta Ba S on: 08/19/2014 12:32 PM   Modules accepted: Level of Service

## 2014-08-19 NOTE — Progress Notes (Signed)
reviewed and agree with Barium study to assess motility.

## 2014-08-19 NOTE — Patient Instructions (Addendum)
You have been scheduled for a Barium Esophogram at [facility] (1st floor of the hospital) on Thursday 08-21-2014 at 12:30 PM . Please arrive at 12:15 PM  prior to your appointment for registration. Make certain not to have anything to eat or drink 3 hours prior to your test. If you need to reschedule for any reason, please contact radiology at [phone] to do so. __________________________________________________________________ A barium swallow is an examination that concentrates on views of the esophagus. This tends to be a double contrast exam (barium and two liquids which, when combined, create a gas to distend the wall of the oesophagus) or single contrast (non-ionic iodine based). The study is usually tailored to your symptoms so a good history is essential. Attention is paid during the study to the form, structure and configuration of the esophagus, looking for functional disorders (such as aspiration, dysphagia, achalasia, motility and reflux) EXAMINATION You may be asked to change into a gown, depending on the type of swallow being performed. A radiologist and radiographer will perform the procedure. The radiologist will advise you of the type of contrast selected for your procedure and direct you during the exam. You will be asked to stand, sit or lie in several different positions and to hold a small amount of fluid in your mouth before being asked to swallow while the imaging is performed .In some instances you may be asked to swallow barium coated marshmallows to assess the motility of a solid food bolus. The exam can be recorded as a digital or video fluoroscopy procedure. POST PROCEDURE It will take 1-2 days for the barium to pass through your system. To facilitate this, it is important, unless otherwise directed, to increase your fluids for the next 24-48hrs and to resume your normal diet.  This test typically takes about 30 minutes to  perform. __________________________________________________________________________________

## 2014-08-19 NOTE — Progress Notes (Signed)
Patient ID: Stephen Wilkins, male   DOB: January 15, 1928, 81 y.o.   MRN: [id number]   Subjective:    Patient ID: Stephen Wilkins, male    DOB: 23-Jun-1927, 81 y.o.   MRN: [id number]  HPI Stephen Wilkins is a pleasant 81 year old white male known to Dr. [name] who has history of previous esophageal stricture requiring Severy dilation in 2008. He is also had colonoscopy, last exam in 2006 was normal. He is referred today by Dr. Jenny Reichmann for evaluation of dysphagia. Patient relates 2 recent episodes of vomiting. He says the first occurred after he had eaten a tuna sandwich at [employer]. He describes that he feels that he may have had food poisoning but his only symptom was vomiting the sandwich back up. On further questioning he did have a sensation that it did not go down to his stomach. He had no associated nausea abdominal cramping diarrhea etc. Apparently the following day he also had a similar episode after eating. Since then he has not had any recurrent episodes and feels that he does better if he eats slowly. He denies any heartburn or indigestion. He has no abdominal pain appetite is been fine and weight has been stable. These 2 episodes occurred about 3 weeks ago. Other medical problems include chronic kidney disease, anxiety, hypertension, hypothyroidism, and hyperlipidemia.  Review of Systems Pertinent positive and negative review of systems were noted in the above HPI section.  All other review of systems was otherwise negative.  Outpatient Encounter Prescriptions as of 08/19/2014  Medication Sig  . ALPRAZolam (XANAX) 0.5 MG tablet Take 1 tablet (0.5 mg total) by mouth daily as needed for anxiety.  . beclomethasone (QVAR) 80 MCG/ACT inhaler Inhale 1 puff into the lungs 2 (two) times daily.  . Cholecalciferol (VITAMIN D) 1000 UNITS capsule Take 1,000 Units by mouth daily.    Marland Kitchen desoximetasone (TOPICORT) 0.25 % cream apply topically to affected area twice a day as needed  . lansoprazole (PREVACID) 30 MG capsule Take 1  capsule (30 mg total) by mouth daily.  Marland Kitchen levothyroxine (SYNTHROID, LEVOTHROID) 75 MCG tablet Take 1 tablet (75 mcg total) by mouth daily.  . Multiple Vitamins-Minerals (CENTRUM SILVER PO) Take by mouth daily.    . Omega-3 Fatty Acids (FISH OIL) 1200 MG CAPS Take by mouth daily.    . polyethylene glycol powder (MIRALAX) powder Take 1/2 capful by mouth once daily  . tamsulosin (FLOMAX) 0.4 MG CAPS capsule take 1 capsule by mouth once daily  . valsartan (DIOVAN) 80 MG tablet Take 1 tablet (80 mg total) by mouth daily.  Marland Kitchen albuterol (PROVENTIL HFA;VENTOLIN HFA) 108 (90 BASE) MCG/ACT inhaler Inhale 2 puffs into the lungs every 6 (six) hours as needed for wheezing.  . [DISCONTINUED] ciprofloxacin (CIPRO) 500 MG tablet Take 1 tablet (500 mg total) by mouth 2 (two) times daily.  . [DISCONTINUED] diclofenac (VOLTAREN) 75 MG EC tablet Take 1 tablet (75 mg total) by mouth 2 (two) times daily. As needed  . [DISCONTINUED] meclizine (ANTIVERT) 12.5 MG tablet Take 1 tablet (12.5 mg total) by mouth 3 (three) times daily as needed.  . [DISCONTINUED] pravastatin (PRAVACHOL) 40 MG tablet Take 1 tablet (40 mg total) by mouth daily.   No facility-administered encounter medications on file as of 08/19/2014.   Allergies  Allergen Reactions  . Atorvastatin     REACTION: myalgia  . Crestor [Rosuvastatin] Other (See Comments)    fatigue  . Doxazosin Mesylate     REACTION: dizziness   Patient Active  Problem List   Diagnosis Date Noted  . Dysphagia 07/29/2014  . Anxiety state 09/06/2013  . CKD (chronic kidney disease) 09/06/2013  . Hypothyroidism 09/06/2013  . Rash and nonspecific skin eruption 02/27/2013  . Dizziness and giddiness 01/04/2013  . Abnormal TSH 06/04/2012  . Gross hematuria 01/09/2012  . Bruising 06/05/2011  . Dementia 09/01/2010  . REACTIVE AIRWAY DISEASE 01/14/2010  . ERECTILE DYSFUNCTION, ORGANIC 04/17/2009  . FATIGUE 06/27/2008  . PSA, INCREASED 06/27/2008  . SCIATICA, RIGHT 04/28/2008    . SCHATZKI'S RING 07/18/2007  . HIATAL HERNIA 07/18/2007  . ABNORMAL ELECTROCARDIOGRAM 06/21/2007  . Hyperlipidemia 03/23/2007  . Essential hypertension 03/23/2007  . ALLERGIC RHINITIS 03/23/2007  . GERD 03/23/2007  . BENIGN PROSTATIC HYPERTROPHY 03/23/2007  . NEPHROLITHIASIS, HX OF 03/23/2007   History   Social History  . Marital Status: Married    Spouse Name: N/A  . Number of Children: N/A  . Years of Education: N/A   Occupational History  . retired [employer] for 20 yrs. later insurance sales for 17 yrs.    Social History Main Topics  . Smoking status: Former Research scientist (life sciences)  . Smokeless tobacco: Never Used  . Alcohol Use: No  . Drug Use: No  . Sexual Activity: Not on file   Other Topics Concern  . Not on file   Social History Narrative    Mr. Kyger family history includes Cancer in his brother and another family member; Emphysema in his brother; Hypertension in an other family member.      Objective:    Filed Vitals:   08/19/14 0929  BP: 102/70  Pulse: 68    Physical Exam    well-developed elderly white male in no acute distress blood pressure 102/70 pulse 68 height 6 foot weight 202. HEENT; nontraumatic normocephalic EOMI PERRLA sclera anicteric, Neck; supple no JVD, Cardiovascular; regular rate and rhythm with S1-S2 no murmur or gallop, Pulmonary; clear bilaterally, Abdomen; soft nontender nondistended bowel sounds are active there is no palpable mass or hepatosplenomegaly, Rectal; exam not done, Extremities; no clubbing cyanosis or edema skin warm and dry, Psych; mood and affect affect appropriate       Assessment & Plan:   #1 81 yo male with hx of esophageal stricture requiring dilation 2008 , now with 2 recent episodes of what sounds like regurgitation  of food - r/o recurrent stricture #2 HTn #3 mild dementia #4 anxiety #5CKD #6 hypothyroidism  Plan: Will schedule for Barium swallow with tablet  ,then if stricture found will need EGD with dilation with  Dr. Olevia Perches  continue Prevacid 30 mg daily in am   Amy Genia Harold PA-C 08/19/2014   Cc: Biagio Borg, MD

## 2014-08-21 ENCOUNTER — Ambulatory Visit (HOSPITAL_COMMUNITY)
Admission: RE | Admit: 2014-08-21 | Discharge: 2014-08-21 | Disposition: A | Discharge status: Home / Self Care | Payer: PPO | Source: Ambulatory Visit | Attending: Physician Assistant | Admitting: Physician Assistant

## 2014-08-21 DIAGNOSIS — K449 Diaphragmatic hernia without obstruction or gangrene: Secondary | ICD-10-CM | POA: Insufficient documentation

## 2014-08-21 DIAGNOSIS — K222 Esophageal obstruction: Secondary | ICD-10-CM | POA: Diagnosis present

## 2014-08-21 DIAGNOSIS — Z8719 Personal history of other diseases of the digestive system: Secondary | ICD-10-CM

## 2014-08-21 DIAGNOSIS — R1314 Dysphagia, pharyngoesophageal phase: Secondary | ICD-10-CM

## 2014-09-11 ENCOUNTER — Ambulatory Visit: Payer: Medicare Other | Admitting: Internal Medicine

## 2014-09-11 ENCOUNTER — Other Ambulatory Visit (INDEPENDENT_AMBULATORY_CARE_PROVIDER_SITE_OTHER): Payer: PPO

## 2014-09-11 ENCOUNTER — Encounter: Payer: Self-pay | Admitting: Internal Medicine

## 2014-09-11 ENCOUNTER — Ambulatory Visit (INDEPENDENT_AMBULATORY_CARE_PROVIDER_SITE_OTHER): Payer: PPO | Admitting: Internal Medicine

## 2014-09-11 VITALS — BP 106/52 | HR 86 | Temp 97.4°F | Wt 200.1 lb

## 2014-09-11 DIAGNOSIS — R42 Dizziness and giddiness: Secondary | ICD-10-CM

## 2014-09-11 DIAGNOSIS — R7989 Other specified abnormal findings of blood chemistry: Secondary | ICD-10-CM

## 2014-09-11 DIAGNOSIS — R945 Abnormal results of liver function studies: Secondary | ICD-10-CM

## 2014-09-11 DIAGNOSIS — R109 Unspecified abdominal pain: Secondary | ICD-10-CM

## 2014-09-11 LAB — CBC WITH DIFFERENTIAL/PLATELET
BASOS ABS: 0 10*3/uL (ref 0.0–0.1)
Basophils Relative: 0.3 % (ref 0.0–3.0)
Eosinophils Absolute: 0.2 10*3/uL (ref 0.0–0.7)
Eosinophils Relative: 1.6 % (ref 0.0–5.0)
HEMATOCRIT: 41.7 % (ref 39.0–52.0)
Hemoglobin: 14 g/dL (ref 13.0–17.0)
LYMPHS ABS: 1.5 10*3/uL (ref 0.7–4.0)
LYMPHS PCT: 13.3 % (ref 12.0–46.0)
MCHC: 33.6 g/dL (ref 30.0–36.0)
MCV: 95.6 fl (ref 78.0–100.0)
Monocytes Absolute: 0.7 10*3/uL (ref 0.1–1.0)
Monocytes Relative: 6.6 % (ref 3.0–12.0)
Neutro Abs: 8.7 10*3/uL — ABNORMAL HIGH (ref 1.4–7.7)
Neutrophils Relative %: 78.2 % — ABNORMAL HIGH (ref 43.0–77.0)
Platelets: 242 10*3/uL (ref 150.0–400.0)
RBC: 4.36 Mil/uL (ref 4.22–5.81)
RDW: 13.8 % (ref 11.5–15.5)
WBC: 11.1 10*3/uL — ABNORMAL HIGH (ref 4.0–10.5)

## 2014-09-11 LAB — URINALYSIS, ROUTINE W REFLEX MICROSCOPIC
Hgb urine dipstick: NEGATIVE
Ketones, ur: NEGATIVE
Leukocytes, UA: NEGATIVE
NITRITE: NEGATIVE
RBC / HPF: NONE SEEN (ref 0–?)
SPECIFIC GRAVITY, URINE: 1.01 (ref 1.000–1.030)
Urine Glucose: NEGATIVE
Urobilinogen, UA: 4 — AB (ref 0.0–1.0)
pH: 7 (ref 5.0–8.0)

## 2014-09-11 LAB — HEPATIC FUNCTION PANEL
ALBUMIN: 3.9 g/dL (ref 3.5–5.2)
ALK PHOS: 135 U/L — AB (ref 39–117)
ALT: 391 U/L — ABNORMAL HIGH (ref 0–53)
AST: 236 U/L — ABNORMAL HIGH (ref 0–37)
BILIRUBIN TOTAL: 12.1 mg/dL — AB (ref 0.2–1.2)
Bilirubin, Direct: 7.5 mg/dL — ABNORMAL HIGH (ref 0.0–0.3)
Total Protein: 6.8 g/dL (ref 6.0–8.3)

## 2014-09-11 LAB — BASIC METABOLIC PANEL
BUN: 32 mg/dL — AB (ref 6–23)
CALCIUM: 9.6 mg/dL (ref 8.4–10.5)
CO2: 30 meq/L (ref 19–32)
Chloride: 100 mEq/L (ref 96–112)
Creatinine, Ser: 2.13 mg/dL — ABNORMAL HIGH (ref 0.40–1.50)
GFR: 31.58 mL/min — ABNORMAL LOW (ref >60.00)
Glucose, Bld: 125 mg/dL — ABNORMAL HIGH (ref 70–99)
Potassium: 4.6 mEq/L (ref 3.5–5.1)
SODIUM: 136 meq/L (ref 135–145)

## 2014-09-11 LAB — LIPASE: Lipase: 17 U/L (ref 11.0–59.0)

## 2014-09-11 LAB — SEDIMENTATION RATE: Sed Rate: 31 mm/hr — ABNORMAL HIGH (ref 0–22)

## 2014-09-11 MED ORDER — ONDANSETRON HCL 4 MG PO TABS: 4.0000 mg | ORAL_TABLET | Freq: Three times a day (TID) | ORAL | Status: DC | PRN

## 2014-09-11 NOTE — Assessment & Plan Note (Signed)
Pt unfort declines abd u/s recently, but now willing, will ask for urgent given pain and ? Juandice as well

## 2014-09-11 NOTE — Progress Notes (Signed)
Pre visit review using our clinic review tool, if applicable. No additional management support is needed unless otherwise documented below in the visit note. 

## 2014-09-11 NOTE — Assessment & Plan Note (Signed)
Has been a recurring issue, ENT exam seems fine today and no overt other neuro symtpoms, suspect an element of volume depletion given n/v yesterday, ok for prn zofran

## 2014-09-11 NOTE — Assessment & Plan Note (Addendum)
Etiology unclear, diff includes GB dz, PUD, pancreatitis vs other - for cont'd PPI for now, repeat labs as per apr 26 visit with lipase, refer back to GI  Note:  Total time for pt hx, exam, review of record with pt in the room, determination of diagnoses and plan for further eval and tx is > 40 min, with over 50% spent in coordination and counseling of patient

## 2014-09-11 NOTE — Progress Notes (Signed)
Subjective:    Patient ID: Stephen Wilkins, male    DOB: 1927-11-29, 81 y.o.   MRN: [id number]  HPI  Here with c/o 2nd attack of similar pain as per last visit that had resolved, but then recurred the night before last, constant all day yesterday, better somewhat today, wondering about incrased ibuprofen use due to right sciatica worsening recentlly, with ESI done x 2 after MRI, did help for 1 mo, no LBP today.   Abd pain mid abd, mod to severe yesterday, dull, no radiation, but did have nausea with dry heaves x 2 yestserday. No help with warm water, PPI , but might have been some better with lying on the cough. Pain nonpleuritic, nonexertional.  No skin color change. No fever, but has felt sweats at times.  Denies worsening reflux,  dysphagia, no bowel change or blood. Last visit with similar had mild elev LFT's., but he declined the f/u GB ultrasound.  Did have barium esophagram neg per recent GI eval.  Also with recurring dizziness some worse yesterday, worse to mowing the grass yesterday, front yard only as he got too weak and tired.  Has some cataracts both eyes, tends to shuffle feet today. No falls or syncope.  Pt denies chest pain, increased sob or doe, wheezing, orthopnea, PND, increased LE swelling, palpitations, Pt denies new neurological symptoms such as new headache, or facial or extremity weakness or numbness  Past Medical History  Diagnosis Date  . ABNORMAL ELECTROCARDIOGRAM 06/21/2007  . ALLERGIC RHINITIS 03/23/2007  . ASTHMATIC BRONCHITIS, ACUTE 04/27/2007  . BENIGN PROSTATIC HYPERTROPHY 03/23/2007  . BRONCHITIS NOT SPECIFIED AS ACUTE OR CHRONIC 04/21/2007  . BURSITIS, RIGHT HIP 07/31/2008  . Cough 01/29/2009  . ERECTILE DYSFUNCTION, ORGANIC 04/17/2009  . ERECTILE DYSFUNCTION 03/23/2007  . FREQUENCY, URINARY 04/26/2010  . GERD 03/23/2007  . HIATAL HERNIA   . HYPERLIPIDEMIA 03/23/2007  . HYPERTENSION 03/23/2007  . MILD COGNITIVE IMPAIRMENT SO STATED 05/19/2010  . NEPHROLITHIASIS, HX  OF 03/23/2007  . PSA, INCREASED 06/27/2008  . RASH-NONVESICULAR 03/23/2007  . REACTIVE AIRWAY DISEASE 01/14/2010  . SCHATZKI'S RING   . SCIATICA, RIGHT 04/28/2008  . Wheezing 04/17/2009  . Dementia 09/01/2010  . Anxiety state, unspecified 09/06/2013  . Unspecified hypothyroidism 09/06/2013  . Esophageal stricture    Past Surgical History  Procedure Laterality Date  . Tonsillectomy      reports that he has quit smoking. He has never used smokeless tobacco. He reports that he does not drink alcohol or use illicit drugs. family history includes Cancer in his brother and another family member; Emphysema in his brother; Hypertension in an other family member. Allergies  Allergen Reactions  . Atorvastatin     REACTION: myalgia  . Crestor [Rosuvastatin] Other (See Comments)    fatigue  . Doxazosin Mesylate     REACTION: dizziness   Current Outpatient Prescriptions on File Prior to Visit  Medication Sig Dispense Refill  . ALPRAZolam (XANAX) 0.5 MG tablet Take 1 tablet (0.5 mg total) by mouth daily as needed for anxiety. 30 tablet 2  . beclomethasone (QVAR) 80 MCG/ACT inhaler Inhale 1 puff into the lungs 2 (two) times daily. 1 Inhaler 12  . Cholecalciferol (VITAMIN D) 1000 UNITS capsule Take 1,000 Units by mouth daily.      Marland Kitchen desoximetasone (TOPICORT) 0.25 % cream apply topically to affected area twice a day as needed 30 g 0  . lansoprazole (PREVACID) 30 MG capsule Take 1 capsule (30 mg total) by mouth daily. 90 capsule  3  . levothyroxine (SYNTHROID, LEVOTHROID) 75 MCG tablet Take 1 tablet (75 mcg total) by mouth daily. 90 tablet 3  . Multiple Vitamins-Minerals (CENTRUM SILVER PO) Take by mouth daily.      . Omega-3 Fatty Acids (FISH OIL) 1200 MG CAPS Take by mouth daily.      . polyethylene glycol powder (MIRALAX) powder Take 1/2 capful by mouth once daily 1 g 0  . tamsulosin (FLOMAX) 0.4 MG CAPS capsule take 1 capsule by mouth once daily 90 capsule 3  . valsartan (DIOVAN) 80 MG tablet Take 1  tablet (80 mg total) by mouth daily. 90 tablet 3  . albuterol (PROVENTIL HFA;VENTOLIN HFA) 108 (90 BASE) MCG/ACT inhaler Inhale 2 puffs into the lungs every 6 (six) hours as needed for wheezing. 1 Inhaler 11   No current facility-administered medications on file prior to visit.      Review of Systems  Constitutional: Negative for unusual diaphoresis or night sweats HENT: Negative for ringing in ear or discharge Eyes: Negative for double vision or worsening visual disturbance.  Respiratory: Negative for choking and stridor.   Gastrointestinal: Negative for vomiting or other signifcant bowel change Genitourinary: Negative for hematuria or change in urine volume.  Musculoskeletal: Negative for other MSK pain or swelling Skin: Negative for color change and worsening wound.  Neurological: Negative for tremors and numbness other than noted  Psychiatric/Behavioral: Negative for decreased concentration or agitation other than above       Objective:   Physical Exam BP 106/52 mmHg  Pulse 86  Temp(Src) 97.4 F (36.3 C) (Oral)  Wt 200 lb 1.9 oz (90.774 kg)  SpO2 96% VS noted, probable mild jaundiced skin Constitutional: Pt appears in no significant distress HENT: Head: NCAT.  Right Ear: External ear normal.  Left Ear: External ear normal.  Eyes: . Pupils are equal, round, and reactive to light. Conjunctivae and EOM are normal Neck: Normal range of motion. Neck supple.  Cardiovascular: Normal rate and regular rhythm.   Pulmonary/Chest: Effort normal and breath sounds without rales or wheezing.  Abd:  Soft, NT except minimal epigastric tender , ND, + BS Neurological: Pt is alert. Not confused , motor grossly intact Skin: Skin is warm. No rash, no LE edema Psychiatric: Pt behavior is normal. No agitation.     Assessment & Plan:

## 2014-09-11 NOTE — Patient Instructions (Addendum)
Please take all new medication as prescribed - the zofran for nausea if needed  Please stop all ibuprofen use  Please continue all other medications as before, and refills have been done if requested.  Please have the pharmacy call with any other refills you may need.  Please continue your efforts at being more active, low cholesterol diet, and weight control  You will be contacted regarding the referral for: Gastroenterology  Please keep your appointments with your specialists as you may have planned  You will be contacted regarding the referral for: Abdomen ultrasound (to see [facility] [place] now Please)  Please go to the LAB in the Basement (turn left off the elevator) for the tests to be done today  You will be contacted by phone if any changes need to be made immediately.  Otherwise, you will receive a letter about your results with an explanation, but please check with MyChart first.  Please remember to sign up for MyChart if you have not done so, as this will be important to you in the future with finding out test results, communicating by private email, and scheduling acute appointments online when needed.

## 2014-09-12 LAB — HEPATITIS PANEL, ACUTE
HCV AB: NEGATIVE
Hep A IgM: NONREACTIVE
Hep B C IgM: NONREACTIVE
Hepatitis B Surface Ag: NEGATIVE

## 2014-09-16 ENCOUNTER — Telehealth: Payer: Self-pay | Admitting: Internal Medicine

## 2014-09-16 ENCOUNTER — Ambulatory Visit
Admission: RE | Admit: 2014-09-16 | Discharge: 2014-09-16 | Disposition: A | Discharge status: Home / Self Care | Payer: PPO | Source: Ambulatory Visit | Attending: Internal Medicine | Admitting: Internal Medicine

## 2014-09-16 DIAGNOSIS — R7989 Other specified abnormal findings of blood chemistry: Secondary | ICD-10-CM

## 2014-09-16 DIAGNOSIS — R42 Dizziness and giddiness: Secondary | ICD-10-CM

## 2014-09-16 DIAGNOSIS — R109 Unspecified abdominal pain: Secondary | ICD-10-CM

## 2014-09-16 DIAGNOSIS — R945 Abnormal results of liver function studies: Secondary | ICD-10-CM

## 2014-09-16 NOTE — Telephone Encounter (Signed)
Please consider OV

## 2014-09-16 NOTE — Telephone Encounter (Signed)
Pt wife called in and said pt was taken of the his BP meds last wed and has slowly going up.  181/111- Today  179/08 -today with in 5 mins of each other   They are wanting to know if he needs to take a pill maybe every other day?    Best number [phone]

## 2014-09-18 NOTE — Telephone Encounter (Signed)
Informed patient

## 2014-09-23 ENCOUNTER — Encounter: Payer: Self-pay | Admitting: Internal Medicine

## 2014-09-24 ENCOUNTER — Other Ambulatory Visit (INDEPENDENT_AMBULATORY_CARE_PROVIDER_SITE_OTHER): Payer: PPO

## 2014-09-24 ENCOUNTER — Encounter: Payer: Self-pay | Admitting: Internal Medicine

## 2014-09-24 ENCOUNTER — Ambulatory Visit (INDEPENDENT_AMBULATORY_CARE_PROVIDER_SITE_OTHER): Payer: PPO | Admitting: Internal Medicine

## 2014-09-24 VITALS — BP 96/62 | HR 91 | Temp 98.1°F | Ht 72.0 in | Wt 203.0 lb

## 2014-09-24 DIAGNOSIS — N183 Chronic kidney disease, stage 3 (moderate): Secondary | ICD-10-CM

## 2014-09-24 DIAGNOSIS — R945 Abnormal results of liver function studies: Secondary | ICD-10-CM

## 2014-09-24 DIAGNOSIS — R109 Unspecified abdominal pain: Secondary | ICD-10-CM

## 2014-09-24 DIAGNOSIS — R7989 Other specified abnormal findings of blood chemistry: Secondary | ICD-10-CM

## 2014-09-24 DIAGNOSIS — I10 Essential (primary) hypertension: Secondary | ICD-10-CM

## 2014-09-24 LAB — CBC WITH DIFFERENTIAL/PLATELET
BASOS PCT: 0.4 % (ref 0.0–3.0)
Basophils Absolute: 0 10*3/uL (ref 0.0–0.1)
EOS PCT: 2.8 % (ref 0.0–5.0)
Eosinophils Absolute: 0.2 10*3/uL (ref 0.0–0.7)
HEMATOCRIT: 36.7 % — AB (ref 39.0–52.0)
HEMOGLOBIN: 12.4 g/dL — AB (ref 13.0–17.0)
LYMPHS ABS: 2.4 10*3/uL (ref 0.7–4.0)
Lymphocytes Relative: 31.7 % (ref 12.0–46.0)
MCHC: 33.8 g/dL (ref 30.0–36.0)
MCV: 95.8 fl (ref 78.0–100.0)
MONOS PCT: 7.4 % (ref 3.0–12.0)
Monocytes Absolute: 0.6 10*3/uL (ref 0.1–1.0)
Neutro Abs: 4.4 10*3/uL (ref 1.4–7.7)
Neutrophils Relative %: 57.7 % (ref 43.0–77.0)
Platelets: 297 10*3/uL (ref 150.0–400.0)
RBC: 3.83 Mil/uL — AB (ref 4.22–5.81)
RDW: 13.3 % (ref 11.5–15.5)
WBC: 7.6 10*3/uL (ref 4.0–10.5)

## 2014-09-24 LAB — HEPATIC FUNCTION PANEL
ALT: 20 U/L (ref 0–53)
AST: 19 U/L (ref 0–37)
Albumin: 3.8 g/dL (ref 3.5–5.2)
Alkaline Phosphatase: 73 U/L (ref 39–117)
BILIRUBIN DIRECT: 0.3 mg/dL (ref 0.0–0.3)
TOTAL PROTEIN: 6.9 g/dL (ref 6.0–8.3)
Total Bilirubin: 0.9 mg/dL (ref 0.2–1.2)

## 2014-09-24 LAB — BASIC METABOLIC PANEL
BUN: 27 mg/dL — ABNORMAL HIGH (ref 6–23)
CO2: 30 meq/L (ref 19–32)
Calcium: 9.5 mg/dL (ref 8.4–10.5)
Chloride: 105 mEq/L (ref 96–112)
Creatinine, Ser: 1.7 mg/dL — ABNORMAL HIGH (ref 0.40–1.50)
GFR: 40.96 mL/min — AB (ref >60.00)
Glucose, Bld: 113 mg/dL — ABNORMAL HIGH (ref 70–99)
Potassium: 4.6 mEq/L (ref 3.5–5.1)
Sodium: 141 mEq/L (ref 135–145)

## 2014-09-24 MED ORDER — LOSARTAN POTASSIUM 50 MG PO TABS: 50.0000 mg | ORAL_TABLET | Freq: Every day | ORAL | Status: DC

## 2014-09-24 NOTE — Progress Notes (Signed)
Subjective:    Patient ID: Stephen Wilkins, male    DOB: [birth year], 81 y.o.   MRN: [id number]  HPI  Here to f/u, pt with dementia and numerous questions without good comprehension or understanding, fortunately wife here as well to assist.  Pt seen 2 wks ago with new issue of abd pain, found to be afeb, vss but marked elev LFT's and mild elev WBC, as well as mild worsening AKI on CKD. .  Was supposed to stop the diovan and return in 1 wk, hep panel neg and abd u/s without significant abnormal, though not able to visualize the bile ducts due to bowel gas.  Pt stopped the diovan for 1 wk, but then since did not come back today and his BP increased to sbp approx 180, decided to re-start at qod dosing, same strength.  BP today quite low after taking daily for the last 2 days, Pt denies chest pain, increased sob or doe, wheezing, orthopnea, PND, increased LE swelling, palpitations, dizziness or syncope.  Pt denies new neurological symptoms such as new headache, or facial or extremity weakness or numbness   Pt denies polydipsia, polyuria, or low sugar symptoms such as weakness or confusion improved with po intake.  Pt states overall good compliance with meds, trying to follow lower cholesterol, diabetic diet, wt overall stable but little exercise however.  Pt himself does not recall the abd pain from last visit, denies any current pain or when it might have improved.  Remains afeb.  Has not yet seen GI for elev LFT's, had been seen pripor to June 9 for dysphagia.   Past Medical History  Diagnosis Date  . ABNORMAL ELECTROCARDIOGRAM 06/21/2007  . ALLERGIC RHINITIS 03/23/2007  . ASTHMATIC BRONCHITIS, ACUTE 04/27/2007  . BENIGN PROSTATIC HYPERTROPHY 03/23/2007  . BRONCHITIS NOT SPECIFIED AS ACUTE OR CHRONIC 04/21/2007  . BURSITIS, RIGHT HIP 07/31/2008  . Cough 01/29/2009  . ERECTILE DYSFUNCTION, ORGANIC 04/17/2009  . ERECTILE DYSFUNCTION 03/23/2007  . FREQUENCY, URINARY 04/26/2010  . GERD 03/23/2007  . HIATAL  HERNIA   . HYPERLIPIDEMIA 03/23/2007  . HYPERTENSION 03/23/2007  . MILD COGNITIVE IMPAIRMENT SO STATED 05/19/2010  . NEPHROLITHIASIS, HX OF 03/23/2007  . PSA, INCREASED 06/27/2008  . RASH-NONVESICULAR 03/23/2007  . REACTIVE AIRWAY DISEASE 01/14/2010  . SCHATZKI'S RING   . SCIATICA, RIGHT 04/28/2008  . Wheezing 04/17/2009  . Dementia 09/01/2010  . Anxiety state, unspecified 09/06/2013  . Unspecified hypothyroidism 09/06/2013  . Esophageal stricture    Past Surgical History  Procedure Laterality Date  . Tonsillectomy      reports that he has quit smoking. He has never used smokeless tobacco. He reports that he does not drink alcohol or use illicit drugs. family history includes Cancer in his brother and another family member; Emphysema in his brother; Hypertension in an other family member. Allergies  Allergen Reactions  . Atorvastatin     REACTION: myalgia  . Crestor [Rosuvastatin] Other (See Comments)    fatigue  . Doxazosin Mesylate     REACTION: dizziness   Current Outpatient Prescriptions on File Prior to Visit  Medication Sig Dispense Refill  . ALPRAZolam (XANAX) 0.5 MG tablet Take 1 tablet (0.5 mg total) by mouth daily as needed for anxiety. 30 tablet 2  . beclomethasone (QVAR) 80 MCG/ACT inhaler Inhale 1 puff into the lungs 2 (two) times daily. 1 Inhaler 12  . Cholecalciferol (VITAMIN D) 1000 UNITS capsule Take 1,000 Units by mouth daily.      Marland Kitchen desoximetasone (TOPICORT)  0.25 % cream apply topically to affected area twice a day as needed 30 g 0  . lansoprazole (PREVACID) 30 MG capsule Take 1 capsule (30 mg total) by mouth daily. 90 capsule 3  . levothyroxine (SYNTHROID, LEVOTHROID) 75 MCG tablet Take 1 tablet (75 mcg total) by mouth daily. 90 tablet 3  . Multiple Vitamins-Minerals (CENTRUM SILVER PO) Take by mouth daily.      . Omega-3 Fatty Acids (FISH OIL) 1200 MG CAPS Take by mouth daily.      . polyethylene glycol powder (MIRALAX) powder Take 1/2 capful by mouth once daily 1  g 0  . pravastatin (PRAVACHOL) 40 MG tablet   0  . tamsulosin (FLOMAX) 0.4 MG CAPS capsule take 1 capsule by mouth once daily 90 capsule 3  . valsartan (DIOVAN) 80 MG tablet Take 1 tablet (80 mg total) by mouth daily. 90 tablet 3  . vitamin C (ASCORBIC ACID) 500 MG tablet Take 500 mg by mouth daily.    Marland Kitchen albuterol (PROVENTIL HFA;VENTOLIN HFA) 108 (90 BASE) MCG/ACT inhaler Inhale 2 puffs into the lungs every 6 (six) hours as needed for wheezing. 1 Inhaler 11  . ondansetron (ZOFRAN) 4 MG tablet Take 1 tablet (4 mg total) by mouth every 8 (eight) hours as needed for nausea or vomiting. (Patient not taking: Reported on 09/24/2014) 30 tablet 0  . prednisoLONE acetate (PRED FORTE) 1 % ophthalmic suspension INSTILL 1 DROP TO OPERATIVE EYE 4 TIMES DAILY.  0   No current facility-administered medications on file prior to visit.   Review of Systems  Constitutional: Negative for unusual diaphoresis or night sweats HENT: Negative for ringing in ear or discharge Eyes: Negative for double vision or worsening visual disturbance.  Respiratory: Negative for choking and stridor.   Gastrointestinal: Negative for vomiting or other signifcant bowel change Genitourinary: Negative for hematuria or change in urine volume.  Musculoskeletal: Negative for other MSK pain or swelling Skin: Negative for color change and worsening wound.  Neurological: Negative for tremors and numbness other than noted  Psychiatric/Behavioral: Negative for decreased concentration or agitation other than above       Objective:   Physical Exam BP 96/62 mmHg  Pulse 91  Temp(Src) 98.1 F (36.7 C) (Oral)  Ht 6' (1.829 m)  Wt 203 lb (92.08 kg)  BMI 27.53 kg/m2  SpO2 93% VS noted,  Constitutional: Pt appears in no significant distress HENT: Head: NCAT.  Right Ear: External ear normal.  Left Ear: External ear normal.  Eyes: . Pupils are equal, round, and reactive to light. Conjunctivae and EOM are normal Neck: Normal range of  motion. Neck supple.  Cardiovascular: Normal rate and regular rhythm.   Pulmonary/Chest: Effort normal and breath sounds without rales or wheezing.  Abd:  Soft, NT, ND, + BS Neurological: Pt is alert. + baseline confused , motor grossly intact Skin: Skin is warm. No rash, no LE edema Psychiatric: Pt behavior is normal. No agitation.     Assessment & Plan:

## 2014-09-24 NOTE — Assessment & Plan Note (Signed)
With hypotension today with taking diovan 80 mg only last 2 days; to d/c diovan, start losartan 50, but with attention to renal fxn on labs today. BP Readings from Last 3 Encounters:  09/24/14 96/62  09/11/14 106/52  08/19/14 102/70

## 2014-09-24 NOTE — Progress Notes (Signed)
Pre visit review using our clinic review tool, if applicable. No additional management support is needed unless otherwise documented below in the visit note. 

## 2014-09-24 NOTE — Assessment & Plan Note (Signed)
Resolved, etiology unclear,  to f/u any worsening symptoms or concerns

## 2014-09-24 NOTE — Assessment & Plan Note (Signed)
With mild worsening recently, for f/u bun/cr today

## 2014-09-24 NOTE — Patient Instructions (Addendum)
OK to stop the diovan  Please take all new medication as prescribed - the losartan at 50 mg per day  Please continue all other medications as before, and refills have been done if requested.  Please have the pharmacy call with any other refills you may need.  Please keep your appointments with your specialists as you may have planned  You will be contacted regarding the referral for: Gastroenterology  Please go to the LAB in the Basement (turn left off the elevator) for the tests to be done today  You will be contacted by phone if any changes need to be made immediately.  Otherwise, you will receive a letter about your results with an explanation, but please check with MyChart first.  Please remember to sign up for MyChart if you have not done so, as this will be important to you in the future with finding out test results, communicating by private email, and scheduling acute appointments online when needed.  Please return in 3 months, or sooner if needed

## 2014-09-24 NOTE — Assessment & Plan Note (Signed)
In retrospect, may have passed a CBD stone? , pt asympt , afeb, VSS and exam benign, for repeat LFT's today and f/u with Gi as planned

## 2014-12-23 ENCOUNTER — Other Ambulatory Visit: Payer: Self-pay | Admitting: Internal Medicine

## 2015-01-28 ENCOUNTER — Other Ambulatory Visit (INDEPENDENT_AMBULATORY_CARE_PROVIDER_SITE_OTHER): Payer: PPO

## 2015-01-28 ENCOUNTER — Telehealth: Payer: Self-pay | Admitting: Internal Medicine

## 2015-01-28 ENCOUNTER — Ambulatory Visit (INDEPENDENT_AMBULATORY_CARE_PROVIDER_SITE_OTHER): Payer: PPO | Admitting: Internal Medicine

## 2015-01-28 ENCOUNTER — Encounter: Payer: Self-pay | Admitting: Internal Medicine

## 2015-01-28 VITALS — BP 102/76 | HR 84 | Temp 98.0°F | Wt 207.0 lb

## 2015-01-28 DIAGNOSIS — E785 Hyperlipidemia, unspecified: Secondary | ICD-10-CM | POA: Diagnosis not present

## 2015-01-28 DIAGNOSIS — Z23 Encounter for immunization: Secondary | ICD-10-CM

## 2015-01-28 DIAGNOSIS — N183 Chronic kidney disease, stage 3 (moderate): Secondary | ICD-10-CM

## 2015-01-28 DIAGNOSIS — I10 Essential (primary) hypertension: Secondary | ICD-10-CM

## 2015-01-28 DIAGNOSIS — E890 Postprocedural hypothyroidism: Secondary | ICD-10-CM | POA: Diagnosis not present

## 2015-01-28 LAB — TSH: TSH: 8.54 u[IU]/mL — AB (ref 0.35–4.50)

## 2015-01-28 LAB — URINALYSIS, ROUTINE W REFLEX MICROSCOPIC
Bilirubin Urine: NEGATIVE
HGB URINE DIPSTICK: NEGATIVE
Ketones, ur: NEGATIVE
Leukocytes, UA: NEGATIVE
NITRITE: NEGATIVE
SPECIFIC GRAVITY, URINE: 1.02 (ref 1.000–1.030)
TOTAL PROTEIN, URINE-UPE24: NEGATIVE
Urine Glucose: NEGATIVE
Urobilinogen, UA: 0.2 (ref 0.0–1.0)
pH: 6 (ref 5.0–8.0)

## 2015-01-28 LAB — HEPATIC FUNCTION PANEL
ALK PHOS: 62 U/L (ref 39–117)
ALT: 12 U/L (ref 0–53)
AST: 18 U/L (ref 0–37)
Albumin: 4.1 g/dL (ref 3.5–5.2)
BILIRUBIN DIRECT: 0.1 mg/dL (ref 0.0–0.3)
BILIRUBIN TOTAL: 1 mg/dL (ref 0.2–1.2)
TOTAL PROTEIN: 7.1 g/dL (ref 6.0–8.3)

## 2015-01-28 LAB — BASIC METABOLIC PANEL
BUN: 28 mg/dL — ABNORMAL HIGH (ref 6–23)
CHLORIDE: 104 meq/L (ref 96–112)
CO2: 32 meq/L (ref 19–32)
CREATININE: 1.6 mg/dL — AB (ref 0.40–1.50)
Calcium: 9.9 mg/dL (ref 8.4–10.5)
GFR: 43.89 mL/min — ABNORMAL LOW (ref >60.00)
Glucose, Bld: 107 mg/dL — ABNORMAL HIGH (ref 70–99)
Potassium: 4.5 mEq/L (ref 3.5–5.1)
Sodium: 144 mEq/L (ref 135–145)

## 2015-01-28 LAB — CBC WITH DIFFERENTIAL/PLATELET
BASOS PCT: 0.4 % (ref 0.0–3.0)
Basophils Absolute: 0 10*3/uL (ref 0.0–0.1)
EOS ABS: 0.2 10*3/uL (ref 0.0–0.7)
Eosinophils Relative: 2.3 % (ref 0.0–5.0)
HCT: 41.6 % (ref 39.0–52.0)
Hemoglobin: 13.8 g/dL (ref 13.0–17.0)
LYMPHS ABS: 2.4 10*3/uL (ref 0.7–4.0)
LYMPHS PCT: 32.8 % (ref 12.0–46.0)
MCHC: 33.2 g/dL (ref 30.0–36.0)
MCV: 93.9 fl (ref 78.0–100.0)
MONO ABS: 0.6 10*3/uL (ref 0.1–1.0)
MONOS PCT: 8 % (ref 3.0–12.0)
NEUTROS ABS: 4.1 10*3/uL (ref 1.4–7.7)
NEUTROS PCT: 56.5 % (ref 43.0–77.0)
PLATELETS: 249 10*3/uL (ref 150.0–400.0)
RBC: 4.43 Mil/uL (ref 4.22–5.81)
RDW: 12.8 % (ref 11.5–15.5)
WBC: 7.3 10*3/uL (ref 4.0–10.5)

## 2015-01-28 LAB — LIPID PANEL
CHOL/HDL RATIO: 5
Cholesterol: 215 mg/dL — ABNORMAL HIGH (ref 0–200)
HDL: 39.7 mg/dL (ref >39.00)
NonHDL: 175.22
TRIGLYCERIDES: 232 mg/dL — AB (ref 0.0–149.0)
VLDL: 46.4 mg/dL — ABNORMAL HIGH (ref 0.0–40.0)

## 2015-01-28 LAB — LDL CHOLESTEROL, DIRECT: LDL DIRECT: 149 mg/dL

## 2015-01-28 MED ORDER — LOSARTAN POTASSIUM 50 MG PO TABS: 25.0000 mg | ORAL_TABLET | Freq: Every day | ORAL | Status: DC

## 2015-01-28 MED ORDER — POLYETHYLENE GLYCOL 3350 17 GM/SCOOP PO POWD: ORAL | Status: AC

## 2015-01-28 MED ORDER — LEVOTHYROXINE SODIUM 100 MCG PO TABS: 100.0000 ug | ORAL_TABLET | Freq: Every day | ORAL | Status: DC

## 2015-01-28 NOTE — Addendum Note (Signed)
Addended by: Biagio Borg on: 01/28/2015 04:20 PM   Modules accepted: Orders

## 2015-01-28 NOTE — Telephone Encounter (Signed)
To Stephen Wilkins  There is nursing note that pt may not have been taking the thyroid medication at last visit  Please verity with pt by phone; not pt has dementia  If he has not been taking the thyroid med, we can restart the 75 per day  If he has been taking, he will need the 100 qd

## 2015-01-28 NOTE — Assessment & Plan Note (Signed)
stable overall by history and exam, recent data reviewed with pt, and pt to continue medical treatment as before,  to f/u any worsening symptoms or concerns Lab Results  Component Value Date   LDLCALC 134* 07/29/2014

## 2015-01-28 NOTE — Assessment & Plan Note (Signed)
stable overall by history and exam, recent data reviewed with pt, and pt to continue medical treatment as before,  to f/u any worsening symptoms or concerns Lab Results  Component Value Date   TSH 8.33* 07/29/2014   For f/u lab

## 2015-01-28 NOTE — Addendum Note (Signed)
Addended byCloyd Stagers B on: 01/28/2015 02:06 PM   Modules accepted: Orders

## 2015-01-28 NOTE — Progress Notes (Signed)
Subjective:    Patient ID: Stephen Wilkins, male    DOB: 06-15-1927, 81 y.o.   MRN: [id number]  HPI  Here for yearly f/u;  Overall doing ok;  Pt denies Chest pain, worsening SOB, DOE, wheezing, orthopnea, PND, worsening LE edema, palpitations, dizziness or syncope.  Pt denies neurological change such as new headache, facial or extremity weakness.  Pt denies polydipsia, polyuria, or low sugar symptoms. Pt states overall good compliance with treatment and medications, good tolerability, and has been trying to follow appropriate diet.  Pt denies worsening depressive symptoms, suicidal ideation or panic. No fever, night sweats, wt loss, loss of appetite, or other constitutional symptoms.  Pt states good ability with ADL's, has low fall risk, home safety reviewed and adequate, no other significant changes in hearing or vision, and only occasionally active with exercise. Having some hard stools despite miralax.  Denies hyper or hypo thyroid symptoms such as voice, skin or hair change. Past Medical History  Diagnosis Date  . ABNORMAL ELECTROCARDIOGRAM 06/21/2007  . ALLERGIC RHINITIS 03/23/2007  . ASTHMATIC BRONCHITIS, ACUTE 04/27/2007  . BENIGN PROSTATIC HYPERTROPHY 03/23/2007  . BRONCHITIS NOT SPECIFIED AS ACUTE OR CHRONIC 04/21/2007  . BURSITIS, RIGHT HIP 07/31/2008  . Cough 01/29/2009  . ERECTILE DYSFUNCTION, ORGANIC 04/17/2009  . ERECTILE DYSFUNCTION 03/23/2007  . FREQUENCY, URINARY 04/26/2010  . GERD 03/23/2007  . HIATAL HERNIA   . HYPERLIPIDEMIA 03/23/2007  . HYPERTENSION 03/23/2007  . MILD COGNITIVE IMPAIRMENT SO STATED 05/19/2010  . NEPHROLITHIASIS, HX OF 03/23/2007  . PSA, INCREASED 06/27/2008  . RASH-NONVESICULAR 03/23/2007  . REACTIVE AIRWAY DISEASE 01/14/2010  . SCHATZKI'S RING   . SCIATICA, RIGHT 04/28/2008  . Wheezing 04/17/2009  . Dementia 09/01/2010  . Anxiety state, unspecified 09/06/2013  . Unspecified hypothyroidism 09/06/2013  . Esophageal stricture    Past Surgical History    Procedure Laterality Date  . Tonsillectomy      reports that he has quit smoking. He has never used smokeless tobacco. He reports that he does not drink alcohol or use illicit drugs. family history includes Cancer in his brother and another family member; Emphysema in his brother; Hypertension in an other family member. Allergies  Allergen Reactions  . Atorvastatin     REACTION: myalgia  . Crestor [Rosuvastatin] Other (See Comments)    fatigue  . Doxazosin Mesylate     REACTION: dizziness   Current Outpatient Prescriptions on File Prior to Visit  Medication Sig Dispense Refill  . ALPRAZolam (XANAX) 0.5 MG tablet Take 1 tablet (0.5 mg total) by mouth daily as needed for anxiety. 30 tablet 2  . beclomethasone (QVAR) 80 MCG/ACT inhaler Inhale 1 puff into the lungs 2 (two) times daily. 1 Inhaler 12  . Cholecalciferol (VITAMIN D) 1000 UNITS capsule Take 1,000 Units by mouth daily.      Marland Kitchen desoximetasone (TOPICORT) 0.25 % cream apply topically to affected area twice a day as needed 30 g 0  . lansoprazole (PREVACID) 30 MG capsule Take 1 capsule (30 mg total) by mouth daily. 90 capsule 3  . Multiple Vitamins-Minerals (CENTRUM SILVER PO) Take by mouth daily.      . Omega-3 Fatty Acids (FISH OIL) 1200 MG CAPS Take by mouth daily.      . prednisoLONE acetate (PRED FORTE) 1 % ophthalmic suspension INSTILL 1 DROP TO OPERATIVE EYE 4 TIMES DAILY.  0  . tamsulosin (FLOMAX) 0.4 MG CAPS capsule take 1 capsule by mouth once daily 90 capsule 3  . vitamin C (ASCORBIC ACID)  500 MG tablet Take 500 mg by mouth daily.    Marland Kitchen albuterol (PROVENTIL HFA;VENTOLIN HFA) 108 (90 BASE) MCG/ACT inhaler Inhale 2 puffs into the lungs every 6 (six) hours as needed for wheezing. 1 Inhaler 11  . levothyroxine (SYNTHROID, LEVOTHROID) 75 MCG tablet Take 1 tablet (75 mcg total) by mouth daily. (Patient not taking: Reported on 01/28/2015) 90 tablet 3  . ondansetron (ZOFRAN) 4 MG tablet Take 1 tablet (4 mg total) by mouth every 8  (eight) hours as needed for nausea or vomiting. (Patient not taking: Reported on 01/28/2015) 30 tablet 0  . pravastatin (PRAVACHOL) 40 MG tablet   0   No current facility-administered medications on file prior to visit.     Review of Systems Constitutional: Negative for increased diaphoresis, other activity, appetite or siginficant weight change other than noted HENT: Negative for worsening hearing loss, ear pain, facial swelling, mouth sores and neck stiffness.   Eyes: Negative for other worsening pain, redness or visual disturbance.  Respiratory: Negative for shortness of breath and wheezing  Cardiovascular: Negative for chest pain and palpitations.  Gastrointestinal: Negative for diarrhea, blood in stool, abdominal distention or other pain Genitourinary: Negative for hematuria, flank pain or change in urine volume.  Musculoskeletal: Negative for myalgias or other joint complaints.  Skin: Negative for color change and wound or drainage.  Neurological: Negative for syncope and numbness. other than noted Hematological: Negative for adenopathy. or other swelling Psychiatric/Behavioral: Negative for hallucinations, SI, self-injury, decreased concentration or other worsening agitation.      Objective:   Physical Exam BP 102/76 mmHg  Pulse 84  Temp(Src) 98 F (36.7 C) (Oral)  Wt 207 lb (93.895 kg)  SpO2 96% VS noted,  Constitutional: Pt is oriented to person, place, and time. Appears well-developed and well-nourished, in no significant distress Head: Normocephalic and atraumatic.  Right Ear: External ear normal.  Left Ear: External ear normal.  Nose: Nose normal.  Mouth/Throat: Oropharynx is clear and moist.  Eyes: Conjunctivae and EOM are normal. Pupils are equal, round, and reactive to light.  Neck: Normal range of motion. Neck supple. No JVD present. No tracheal deviation present or significant neck LA or mass Cardiovascular: Normal rate, regular rhythm, normal heart sounds and  intact distal pulses.   Pulmonary/Chest: Effort normal and breath sounds without rales or wheezing  Abdominal: Soft. Bowel sounds are normal. NT. No HSM  Musculoskeletal: Normal range of motion. Exhibits no edema.  Lymphadenopathy:  Has no cervical adenopathy.  Neurological: Pt is alert and oriented to person, mild confused persists,  Pt has normal reflexes. No cranial nerve deficit. Motor grossly intact Skin: Skin is warm and dry. No rash noted.  Psychiatric:  Has normal mood and affect. Behavior is normal.     Assessment & Plan:

## 2015-01-28 NOTE — Assessment & Plan Note (Signed)
stable overall by history and exam, recent data reviewed with pt, and pt to continue medical treatment as before,  to f/u any worsening symptoms or concerns Lab Results  Component Value Date   CREATININE 1.70* 09/24/2014   For f/u labs today

## 2015-01-28 NOTE — Patient Instructions (Addendum)

## 2015-01-28 NOTE — Assessment & Plan Note (Signed)
stable overall by history and exam, recent data reviewed with pt, and pt to continue medical treatment as before,  to f/u any worsening symptoms or concerns BP Readings from Last 3 Encounters:  01/28/15 102/76  09/24/14 96/62  09/11/14 106/52

## 2015-02-01 ENCOUNTER — Encounter: Payer: Self-pay | Admitting: Internal Medicine

## 2015-02-04 NOTE — Telephone Encounter (Signed)
Per spouse pt is taking daily. He will start new dosage (100 mcg)

## 2015-04-07 DIAGNOSIS — Z961 Presence of intraocular lens: Secondary | ICD-10-CM | POA: Diagnosis not present

## 2015-04-14 ENCOUNTER — Encounter: Payer: Self-pay | Admitting: Cardiology

## 2015-04-22 ENCOUNTER — Ambulatory Visit (INDEPENDENT_AMBULATORY_CARE_PROVIDER_SITE_OTHER)
Admission: RE | Admit: 2015-04-22 | Discharge: 2015-04-22 | Disposition: A | Discharge status: Home / Self Care | Payer: PPO | Source: Ambulatory Visit | Attending: Internal Medicine | Admitting: Internal Medicine

## 2015-04-22 ENCOUNTER — Other Ambulatory Visit (INDEPENDENT_AMBULATORY_CARE_PROVIDER_SITE_OTHER): Payer: PPO

## 2015-04-22 ENCOUNTER — Telehealth: Payer: Self-pay | Admitting: Internal Medicine

## 2015-04-22 ENCOUNTER — Encounter: Payer: Self-pay | Admitting: Internal Medicine

## 2015-04-22 ENCOUNTER — Ambulatory Visit (INDEPENDENT_AMBULATORY_CARE_PROVIDER_SITE_OTHER): Payer: PPO | Admitting: Internal Medicine

## 2015-04-22 VITALS — BP 122/82 | HR 81 | Temp 97.9°F | Resp 20 | Ht 72.0 in | Wt 202.2 lb

## 2015-04-22 DIAGNOSIS — R8290 Unspecified abnormal findings in urine: Secondary | ICD-10-CM | POA: Insufficient documentation

## 2015-04-22 DIAGNOSIS — R062 Wheezing: Secondary | ICD-10-CM

## 2015-04-22 DIAGNOSIS — R05 Cough: Secondary | ICD-10-CM | POA: Diagnosis not present

## 2015-04-22 DIAGNOSIS — I10 Essential (primary) hypertension: Secondary | ICD-10-CM

## 2015-04-22 DIAGNOSIS — R509 Fever, unspecified: Secondary | ICD-10-CM | POA: Diagnosis not present

## 2015-04-22 DIAGNOSIS — J45901 Unspecified asthma with (acute) exacerbation: Secondary | ICD-10-CM | POA: Insufficient documentation

## 2015-04-22 DIAGNOSIS — J4520 Mild intermittent asthma, uncomplicated: Secondary | ICD-10-CM

## 2015-04-22 DIAGNOSIS — R059 Cough, unspecified: Secondary | ICD-10-CM

## 2015-04-22 LAB — URINALYSIS, ROUTINE W REFLEX MICROSCOPIC
Bilirubin Urine: NEGATIVE
Hgb urine dipstick: NEGATIVE
Ketones, ur: NEGATIVE
Leukocytes, UA: NEGATIVE
Nitrite: NEGATIVE
RBC / HPF: NONE SEEN (ref 0–?)
SPECIFIC GRAVITY, URINE: 1.015 (ref 1.000–1.030)
TOTAL PROTEIN, URINE-UPE24: NEGATIVE
URINE GLUCOSE: NEGATIVE
Urobilinogen, UA: 1 (ref 0.0–1.0)
pH: 6 (ref 5.0–8.0)

## 2015-04-22 MED ORDER — BECLOMETHASONE DIPROPIONATE 80 MCG/ACT IN AERS: 1.0000 | INHALATION_SPRAY | Freq: Two times a day (BID) | RESPIRATORY_TRACT | Status: DC

## 2015-04-22 MED ORDER — HYDROCODONE-HOMATROPINE 5-1.5 MG/5ML PO SYRP: 5.0000 mL | ORAL_SOLUTION | Freq: Four times a day (QID) | ORAL | Status: DC | PRN

## 2015-04-22 MED ORDER — ALBUTEROL SULFATE HFA 108 (90 BASE) MCG/ACT IN AERS: 2.0000 | INHALATION_SPRAY | Freq: Four times a day (QID) | RESPIRATORY_TRACT | Status: DC | PRN

## 2015-04-22 MED ORDER — PREDNISONE 10 MG PO TABS: ORAL_TABLET | ORAL | Status: DC

## 2015-04-22 MED ORDER — LEVOFLOXACIN 250 MG PO TABS: 250.0000 mg | ORAL_TABLET | Freq: Every day | ORAL | Status: DC

## 2015-04-22 NOTE — Progress Notes (Signed)
Subjective:    Patient ID: Stephen Wilkins, male    DOB: Sep 04, 1927, 82 y.o.   MRN: [id number]  HPI  Here with wife, Here with acute onset mild to mod 2-3 days ST, HA, general weakness and malaise, with prod cough greenish sputum, but Pt denies chest pain, increased sob or doe, wheezing, orthopnea, PND, increased LE swelling, palpitations, dizziness or syncope, except for onset wheezing/mild sob onset last 2 days.  Out of qvar and proventil prn. Has also has some darker urine with less po intake, but Denies urinary symptoms such as dysuria, frequency, urgency, flank pain, hematuria or n/v, fever, chills. Past Medical History  Diagnosis Date  . ABNORMAL ELECTROCARDIOGRAM 06/21/2007  . ALLERGIC RHINITIS 03/23/2007  . ASTHMATIC BRONCHITIS, ACUTE 04/27/2007  . BENIGN PROSTATIC HYPERTROPHY 03/23/2007  . BRONCHITIS NOT SPECIFIED AS ACUTE OR CHRONIC 04/21/2007  . BURSITIS, RIGHT HIP 07/31/2008  . Cough 01/29/2009  . ERECTILE DYSFUNCTION, ORGANIC 04/17/2009  . ERECTILE DYSFUNCTION 03/23/2007  . FREQUENCY, URINARY 04/26/2010  . GERD 03/23/2007  . HIATAL HERNIA   . HYPERLIPIDEMIA 03/23/2007  . HYPERTENSION 03/23/2007  . MILD COGNITIVE IMPAIRMENT SO STATED 05/19/2010  . NEPHROLITHIASIS, HX OF 03/23/2007  . PSA, INCREASED 06/27/2008  . RASH-NONVESICULAR 03/23/2007  . REACTIVE AIRWAY DISEASE 01/14/2010  . SCHATZKI'S RING   . SCIATICA, RIGHT 04/28/2008  . Wheezing 04/17/2009  . Dementia 09/01/2010  . Anxiety state, unspecified 09/06/2013  . Unspecified hypothyroidism 09/06/2013  . Esophageal stricture    Past Surgical History  Procedure Laterality Date  . Tonsillectomy      reports that he has quit smoking. He has never used smokeless tobacco. He reports that he does not drink alcohol or use illicit drugs. family history includes Cancer in his brother; Emphysema in his brother. Allergies  Allergen Reactions  . Atorvastatin     REACTION: myalgia  . Crestor [Rosuvastatin] Other (See Comments)   fatigue  . Doxazosin Mesylate     REACTION: dizziness   Current Outpatient Prescriptions on File Prior to Visit  Medication Sig Dispense Refill  . ALPRAZolam (XANAX) 0.5 MG tablet Take 1 tablet (0.5 mg total) by mouth daily as needed for anxiety. 30 tablet 2  . Cholecalciferol (VITAMIN D) 1000 UNITS capsule Take 1,000 Units by mouth daily.      . cyanocobalamin 1000 MCG tablet Take 100 mcg by mouth daily.    Marland Kitchen desoximetasone (TOPICORT) 0.25 % cream apply topically to affected area twice a day as needed 30 g 0  . lansoprazole (PREVACID) 30 MG capsule Take 1 capsule (30 mg total) by mouth daily. 90 capsule 3  . levothyroxine (SYNTHROID, LEVOTHROID) 100 MCG tablet Take 1 tablet (100 mcg total) by mouth daily. 90 tablet 3  . losartan (COZAAR) 50 MG tablet Take 0.5 tablets (25 mg total) by mouth daily. 45 tablet 3  . Multiple Vitamins-Minerals (CENTRUM SILVER PO) Take by mouth daily.      . Omega-3 Fatty Acids (FISH OIL) 1200 MG CAPS Take by mouth daily.      . ondansetron (ZOFRAN) 4 MG tablet Take 1 tablet (4 mg total) by mouth every 8 (eight) hours as needed for nausea or vomiting. 30 tablet 0  . polyethylene glycol powder (MIRALAX) powder Take 1/2 capful by mouth once daily 850 g 5  . pravastatin (PRAVACHOL) 40 MG tablet   0  . prednisoLONE acetate (PRED FORTE) 1 % ophthalmic suspension INSTILL 1 DROP TO OPERATIVE EYE 4 TIMES DAILY.  0  . tamsulosin (FLOMAX) 0.4  MG CAPS capsule take 1 capsule by mouth once daily 90 capsule 3  . vitamin C (ASCORBIC ACID) 500 MG tablet Take 500 mg by mouth daily.     No current facility-administered medications on file prior to visit.   Review of Systems  Constitutional: Negative for unusual diaphoresis or night sweats HENT: Negative for ringing in ear or discharge Eyes: Negative for double vision or worsening visual disturbance.  Respiratory: Negative for choking and stridor.   Gastrointestinal: Negative for vomiting or other signifcant bowel  change Genitourinary: Negative for hematuria or change in urine volume.  Musculoskeletal: Negative for other MSK pain or swelling Skin: Negative for color change and worsening wound.  Neurological: Negative for tremors and numbness other than noted  Psychiatric/Behavioral: Negative for decreased concentration or agitation other than above       Objective:   Physical Exam BP 122/82 mmHg  Pulse 81  Temp(Src) 97.9 F (36.6 C) (Oral)  Resp 20  Ht 6' (1.829 m)  Wt 202 lb 4 oz (91.74 kg)  BMI 27.42 kg/m2  SpO2 91% VS noted, mild ill  Constitutional: Pt appears in no significant distress HENT: Head: NCAT.  Right Ear: External ear normal.  Left Ear: External ear normal.  Eyes: . Pupils are equal, round, and reactive to light. Conjunctivae and EOM are normal Bilat tm's with mild erythema.  Max sinus areas non tender.  Pharynx with mild erythema, no exudate Neck: Normal range of motion. Neck supple.  Cardiovascular: Normal rate and regular rhythm.   Pulmonary/Chest: Effort normal and breath sounds without rales but no wheezing.  Abd:  Soft, NT, ND, + BS, no flank tender Neurological: Pt is alert. Not confused , motor grossly intact Skin: Skin is warm. No rash, no LE edema Psychiatric: Pt behavior is normal. No agitation.     Assessment & Plan:

## 2015-04-22 NOTE — Telephone Encounter (Signed)
pts wife states the pharmacy needed a hard copy of the prescription

## 2015-04-22 NOTE — Telephone Encounter (Signed)
Please advise spouse that Rx for Qvar inhaler was sent to the pharmacy electronically sent

## 2015-04-22 NOTE — Patient Instructions (Signed)
Please take all new medication as prescribed - the antibiotic, cough medicine, and prednisone  Please continue all other medications as before, and refills have been done if requested - the Qvar and the Proventil  Please have the pharmacy call with any other refills you may need.  Please keep your appointments with your specialists as you may have planned  Please go to the XRAY Department in the Basement (go straight as you get off the elevator) for the x-ray testing  Please go to the LAB in the Basement (turn left off the elevator) for the tests to be done today - just the urine testing today  You will be contacted by phone if any changes need to be made immediately.  Otherwise, you will receive a letter about your results with an explanation, but please check with MyChart first.  Please remember to sign up for MyChart if you have not done so, as this will be important to you in the future with finding out test results, communicating by private email, and scheduling acute appointments online when needed.

## 2015-04-22 NOTE — Telephone Encounter (Signed)
pts wife called in stating she came in to pick up his prescriptions. She received the syrup but the beclomethasone (QVAR) 80 MCG/ACT inhaler [id number] was not there.  The pharmacy told her that they need a written prescription for it.  She will be by tomorrow to pick up.

## 2015-04-22 NOTE — Progress Notes (Signed)
Pre visit review using our clinic review tool, if applicable. No additional management support is needed unless otherwise documented below in the visit note. 

## 2015-04-23 MED ORDER — BECLOMETHASONE DIPROPIONATE 80 MCG/ACT IN AERS: 1.0000 | INHALATION_SPRAY | Freq: Two times a day (BID) | RESPIRATORY_TRACT | Status: DC

## 2015-04-23 MED ORDER — ALBUTEROL SULFATE HFA 108 (90 BASE) MCG/ACT IN AERS: 2.0000 | INHALATION_SPRAY | Freq: Four times a day (QID) | RESPIRATORY_TRACT | Status: DC | PRN

## 2015-04-23 NOTE — Telephone Encounter (Signed)
I don't have any information about whether or not a hard copy of rx is needed at this time.   I have started the PA for the rx QVAR.  Key: [id number]

## 2015-04-24 NOTE — Telephone Encounter (Signed)
Form signed and faxed

## 2015-04-24 NOTE — Telephone Encounter (Signed)
Contacted [facility] and received the number for PA dept Blase Mess [phone] and verified member ID - [id number], same as listed on pt insurance card. Called [facility] PA dept and initiated PA for Qvar.  Form to be faxed Merla Riches @ [phone]

## 2015-04-24 NOTE — Telephone Encounter (Signed)
Form received and completed, placed on MD's desk for signture

## 2015-04-24 NOTE — Telephone Encounter (Signed)
PA came back with the following information:   Eligibility could not be verified for this patient - patient not found. Please review patient information.

## 2015-04-27 ENCOUNTER — Telehealth: Payer: Self-pay | Admitting: Internal Medicine

## 2015-04-27 NOTE — Telephone Encounter (Signed)
States patient still has cough.  Would like to know if something could be sent to [facility] on [address].   FYI insurance ok qvar

## 2015-04-28 ENCOUNTER — Encounter: Payer: Self-pay | Admitting: Internal Medicine

## 2015-04-28 MED ORDER — BENZONATATE 100 MG PO CAPS: ORAL_CAPSULE | ORAL | Status: DC

## 2015-04-28 NOTE — Telephone Encounter (Signed)
Ok for [employer]

## 2015-04-28 NOTE — Telephone Encounter (Signed)
Please advise, patient still has cough and would like to have something called in

## 2015-04-29 ENCOUNTER — Other Ambulatory Visit: Payer: Self-pay | Admitting: Internal Medicine

## 2015-04-29 ENCOUNTER — Encounter: Payer: Self-pay | Admitting: Internal Medicine

## 2015-04-29 MED ORDER — PREDNISONE 10 MG PO TABS: ORAL_TABLET | ORAL | Status: DC

## 2015-04-29 MED ORDER — BUDESONIDE-FORMOTEROL FUMARATE 160-4.5 MCG/ACT IN AERO: 2.0000 | INHALATION_SPRAY | Freq: Two times a day (BID) | RESPIRATORY_TRACT | Status: DC

## 2015-04-29 MED ORDER — MOMETASONE FURO-FORMOTEROL FUM 200-5 MCG/ACT IN AERO: 2.0000 | INHALATION_SPRAY | Freq: Two times a day (BID) | RESPIRATORY_TRACT | Status: DC

## 2015-04-29 NOTE — Telephone Encounter (Signed)
PA was approved but per pt, Rx was still very expensive. Rx changed to cheaper alternative per MD

## 2015-04-30 ENCOUNTER — Ambulatory Visit (INDEPENDENT_AMBULATORY_CARE_PROVIDER_SITE_OTHER): Payer: PPO | Admitting: Internal Medicine

## 2015-04-30 ENCOUNTER — Encounter: Payer: Self-pay | Admitting: Internal Medicine

## 2015-04-30 VITALS — BP 124/86 | HR 98 | Temp 98.3°F | Resp 20 | Wt 204.0 lb

## 2015-04-30 DIAGNOSIS — F0390 Unspecified dementia without behavioral disturbance: Secondary | ICD-10-CM

## 2015-04-30 DIAGNOSIS — R062 Wheezing: Secondary | ICD-10-CM

## 2015-04-30 DIAGNOSIS — J4530 Mild persistent asthma, uncomplicated: Secondary | ICD-10-CM

## 2015-04-30 DIAGNOSIS — R05 Cough: Secondary | ICD-10-CM | POA: Diagnosis not present

## 2015-04-30 DIAGNOSIS — I10 Essential (primary) hypertension: Secondary | ICD-10-CM | POA: Diagnosis not present

## 2015-04-30 DIAGNOSIS — R059 Cough, unspecified: Secondary | ICD-10-CM

## 2015-04-30 MED ORDER — METHYLPREDNISOLONE ACETATE 80 MG/ML IJ SUSP
80.0000 mg | Freq: Once | INTRAMUSCULAR | Status: AC
  Administered 2015-04-30: 80 mg via INTRAMUSCULAR

## 2015-04-30 MED ORDER — LEVOFLOXACIN 250 MG PO TABS: 250.0000 mg | ORAL_TABLET | Freq: Every day | ORAL | Status: DC

## 2015-04-30 NOTE — Patient Instructions (Signed)
You had the steroid shot today  Please take all new medication as prescribed - the antibiotic  Please continue all other medications as before, and refills have been done if requested.  Please have the pharmacy call with any other refills you may need.  Please keep your appointments with your specialists as you may have planned  Please go to the XRAY Department in the Basement (go straight as you get off the elevator) for the x-ray testing tomorrow  You will be contacted by phone if any changes need to be made immediately.  Otherwise, you will receive a letter about your results with an explanation, but please check with MyChart first.   Please remember to sign up for MyChart if you have not done so, as this will be important to you in the future with finding out test results, communicating by private email, and scheduling acute appointments online when needed.

## 2015-04-30 NOTE — Assessment & Plan Note (Signed)
I suspect darker urine related to concentration with less po intake recent, for UA today

## 2015-04-30 NOTE — Progress Notes (Signed)
Pre visit review using our clinic review tool, if applicable. No additional management support is needed unless otherwise documented below in the visit note. 

## 2015-04-30 NOTE — Progress Notes (Signed)
Subjective:    Patient ID: Stephen Wilkins, male    DOB: [birth year], 82 y.o.   MRN: [id number]  HPI  Here to f/u c/o increased prod cough over last wk with less wheeze and tightness, Here with persistent HA, general weakness and malaise, but Pt denies chest pain, orthopnea, PND, increased LE swelling, palpitations, dizziness or syncope. Dyspnea about the same but just feels bad, worse than last wk.  Remains confused about his inhalers and was not able to refill his qvar due to insurance coverage and difficulty getting a different inhaled steroid covered.  Did take the prednisone and cough med, but for some reason did not pick up the antibiotic as well at pharmacy, so never took this. Past Medical History  Diagnosis Date  . ABNORMAL ELECTROCARDIOGRAM 06/21/2007  . ALLERGIC RHINITIS 03/23/2007  . ASTHMATIC BRONCHITIS, ACUTE 04/27/2007  . BENIGN PROSTATIC HYPERTROPHY 03/23/2007  . BRONCHITIS NOT SPECIFIED AS ACUTE OR CHRONIC 04/21/2007  . BURSITIS, RIGHT HIP 07/31/2008  . Cough 01/29/2009  . ERECTILE DYSFUNCTION, ORGANIC 04/17/2009  . ERECTILE DYSFUNCTION 03/23/2007  . FREQUENCY, URINARY 04/26/2010  . GERD 03/23/2007  . HIATAL HERNIA   . HYPERLIPIDEMIA 03/23/2007  . HYPERTENSION 03/23/2007  . MILD COGNITIVE IMPAIRMENT SO STATED 05/19/2010  . NEPHROLITHIASIS, HX OF 03/23/2007  . PSA, INCREASED 06/27/2008  . RASH-NONVESICULAR 03/23/2007  . REACTIVE AIRWAY DISEASE 01/14/2010  . SCHATZKI'S RING   . SCIATICA, RIGHT 04/28/2008  . Wheezing 04/17/2009  . Dementia 09/01/2010  . Anxiety state, unspecified 09/06/2013  . Unspecified hypothyroidism 09/06/2013  . Esophageal stricture    Past Surgical History  Procedure Laterality Date  . Tonsillectomy      reports that he has quit smoking. He has never used smokeless tobacco. He reports that he does not drink alcohol or use illicit drugs. family history includes Cancer in his brother; Emphysema in his brother. Allergies  Allergen Reactions  . Atorvastatin      REACTION: myalgia  . Crestor [Rosuvastatin] Other (See Comments)    fatigue  . Doxazosin Mesylate     REACTION: dizziness   Current Outpatient Prescriptions on File Prior to Visit  Medication Sig Dispense Refill  . albuterol (PROVENTIL HFA;VENTOLIN HFA) 108 (90 Base) MCG/ACT inhaler Inhale 2 puffs into the lungs every 6 (six) hours as needed for wheezing. 1 Inhaler 11  . ALPRAZolam (XANAX) 0.5 MG tablet Take 1 tablet (0.5 mg total) by mouth daily as needed for anxiety. 30 tablet 2  . benzonatate (TESSALON) 100 MG capsule 1-2 tabs by mouth three times per day as needed for cough 60 capsule 1  . budesonide-formoterol (SYMBICORT) 160-4.5 MCG/ACT inhaler Inhale 2 puffs into the lungs 2 (two) times daily. 1 Inhaler 12  . Cholecalciferol (VITAMIN D) 1000 UNITS capsule Take 1,000 Units by mouth daily.      . cyanocobalamin 1000 MCG tablet Take 100 mcg by mouth daily.    Marland Kitchen desoximetasone (TOPICORT) 0.25 % cream apply topically to affected area twice a day as needed 30 g 0  . HYDROcodone-homatropine (HYCODAN) 5-1.5 MG/5ML syrup Take 5 mLs by mouth every 6 (six) hours as needed for cough. 180 mL 0  . lansoprazole (PREVACID) 30 MG capsule Take 1 capsule (30 mg total) by mouth daily. 90 capsule 3  . levothyroxine (SYNTHROID, LEVOTHROID) 100 MCG tablet Take 1 tablet (100 mcg total) by mouth daily. 90 tablet 3  . losartan (COZAAR) 50 MG tablet Take 0.5 tablets (25 mg total) by mouth daily. 45 tablet 3  .  mometasone-formoterol (DULERA) 200-5 MCG/ACT AERO Inhale 2 puffs into the lungs 2 (two) times daily. 13 g 11  . Multiple Vitamins-Minerals (CENTRUM SILVER PO) Take by mouth daily.      . Omega-3 Fatty Acids (FISH OIL) 1200 MG CAPS Take by mouth daily.      . ondansetron (ZOFRAN) 4 MG tablet Take 1 tablet (4 mg total) by mouth every 8 (eight) hours as needed for nausea or vomiting. 30 tablet 0  . polyethylene glycol powder (MIRALAX) powder Take 1/2 capful by mouth once daily 850 g 5  . pravastatin  (PRAVACHOL) 40 MG tablet   0  . prednisoLONE acetate (PRED FORTE) 1 % ophthalmic suspension INSTILL 1 DROP TO OPERATIVE EYE 4 TIMES DAILY.  0  . predniSONE (DELTASONE) 10 MG tablet 2 tabs by mouth per day for 5 days 10 tablet 0  . tamsulosin (FLOMAX) 0.4 MG CAPS capsule take 1 capsule by mouth once daily 90 capsule 3  . vitamin C (ASCORBIC ACID) 500 MG tablet Take 500 mg by mouth daily.     No current facility-administered medications on file prior to visit.   Review of Systems  Constitutional: Negative for unusual diaphoresis or night sweats HENT: Negative for ringing in ear or discharge Eyes: Negative for double vision or worsening visual disturbance.  Respiratory: Negative for choking and stridor.   Gastrointestinal: Negative for vomiting or other signifcant bowel change Genitourinary: Negative for hematuria or change in urine volume.  Musculoskeletal: Negative for other MSK pain or swelling Skin: Negative for color change and worsening wound.  Neurological: Negative for tremors and numbness other than noted  Psychiatric/Behavioral: Negative for decreased concentration or agitation other than above       Objective:   Physical Exam BP 124/86 mmHg  Pulse 98  Temp(Src) 98.3 F (36.8 C) (Oral)  Resp 20  Wt 204 lb (92.534 kg)  SpO2 93% VS noted,  Constitutional: Pt appears in no significant distress HENT: Head: NCAT.  Right Ear: External ear normal.  Left Ear: External ear normal.  Eyes: . Pupils are equal, round, and reactive to light. Conjunctivae and EOM are normal Neck: Normal range of motion. Neck supple.  Cardiovascular: Normal rate and regular rhythm.   Pulmonary/Chest: Effort normal and breath sounds decreased bialt with few bibas rales but less scattered few wheezing.  Neurological: Pt is alert. Not confused , motor grossly intact Skin: Skin is warm. No rash, no LE edema Psychiatric: Pt behavior is normal. No agitation.     Assessment & Plan:

## 2015-04-30 NOTE — Assessment & Plan Note (Signed)
Mild to mod, for antibx course,  to f/u any worsening symptoms or concerns 

## 2015-04-30 NOTE — Assessment & Plan Note (Signed)
stable overall by history and exam, recent data reviewed with pt, and pt to continue medical treatment as before,  to f/u any worsening symptoms or concerns BP Readings from Last 3 Encounters:  04/22/15 122/82  01/28/15 102/76  09/24/14 96/62

## 2015-04-30 NOTE — Assessment & Plan Note (Signed)
Mild to mod, for inhaler re-starts, predpac asd, to f/u any worsening symptoms or concerns

## 2015-05-01 ENCOUNTER — Ambulatory Visit (INDEPENDENT_AMBULATORY_CARE_PROVIDER_SITE_OTHER)
Admission: RE | Admit: 2015-05-01 | Discharge: 2015-05-01 | Disposition: A | Discharge status: Home / Self Care | Payer: PPO | Source: Ambulatory Visit | Attending: Internal Medicine | Admitting: Internal Medicine

## 2015-05-01 DIAGNOSIS — R05 Cough: Secondary | ICD-10-CM | POA: Diagnosis not present

## 2015-05-01 DIAGNOSIS — R062 Wheezing: Secondary | ICD-10-CM | POA: Diagnosis not present

## 2015-05-01 DIAGNOSIS — R059 Cough, unspecified: Secondary | ICD-10-CM

## 2015-05-02 NOTE — Assessment & Plan Note (Signed)
Wife here to assist with admin and understanding of his meds

## 2015-05-02 NOTE — Assessment & Plan Note (Addendum)
Mild, less severe than last wk, for depomedrol IM, predpac asd,  to f/u any worsening symptoms or concerns

## 2015-05-02 NOTE — Assessment & Plan Note (Signed)
qvar and dulera not covered by insurance, ok for symbicort asd,  to f/u any worsening symptoms or concerns

## 2015-05-02 NOTE — Assessment & Plan Note (Signed)
stable overall by history and exam, recent data reviewed with pt, and pt to continue medical treatment as before,  to f/u any worsening symptoms or concerns BP Readings from Last 3 Encounters:  04/30/15 124/86  04/22/15 122/82  01/28/15 102/76

## 2015-05-02 NOTE — Assessment & Plan Note (Addendum)
Mild to mod, higher suspicion for pna this wk, cxr last wk neg for infiltrate, for antibx, cough med prn, repeat cxr, to f/u any worsening symptoms or concerns

## 2015-05-28 ENCOUNTER — Ambulatory Visit (INDEPENDENT_AMBULATORY_CARE_PROVIDER_SITE_OTHER): Payer: PPO | Admitting: Internal Medicine

## 2015-05-28 ENCOUNTER — Encounter: Payer: Self-pay | Admitting: Internal Medicine

## 2015-05-28 VITALS — BP 112/60 | HR 90 | Temp 97.9°F | Resp 20 | Wt 205.0 lb

## 2015-05-28 DIAGNOSIS — R938 Abnormal findings on diagnostic imaging of other specified body structures: Secondary | ICD-10-CM | POA: Diagnosis not present

## 2015-05-28 DIAGNOSIS — R9389 Abnormal findings on diagnostic imaging of other specified body structures: Secondary | ICD-10-CM

## 2015-05-28 DIAGNOSIS — J4530 Mild persistent asthma, uncomplicated: Secondary | ICD-10-CM | POA: Diagnosis not present

## 2015-05-28 DIAGNOSIS — J189 Pneumonia, unspecified organism: Secondary | ICD-10-CM

## 2015-05-28 MED ORDER — DESOXIMETASONE 0.25 % EX CREA: TOPICAL_CREAM | CUTANEOUS | Status: DC

## 2015-05-28 NOTE — Progress Notes (Signed)
Pre visit review using our clinic review tool, if applicable. No additional management support is needed unless otherwise documented below in the visit note. 

## 2015-05-28 NOTE — Patient Instructions (Signed)
Please continue all other medications as before, and refills have been done if requested.  Please have the pharmacy call with any other refills you may need.  Please continue your efforts at being more active, low cholesterol diet, and weight control.  Please keep your appointments with your specialists as you may have planned  Please go to the XRAY Department in the Basement (go straight as you get off the elevator) for the x-ray testing at your convenience  You will be contacted by phone if any changes need to be made immediately.  Otherwise, you will receive a letter about your results with an explanation, but please check with MyChart first.  Please remember to sign up for MyChart if you have not done so, as this will be important to you in the future with finding out test results, communicating by private email, and scheduling acute appointments online when needed.

## 2015-05-29 ENCOUNTER — Ambulatory Visit (INDEPENDENT_AMBULATORY_CARE_PROVIDER_SITE_OTHER)
Admission: RE | Admit: 2015-05-29 | Discharge: 2015-05-29 | Disposition: A | Discharge status: Home / Self Care | Payer: PPO | Source: Ambulatory Visit | Attending: Internal Medicine | Admitting: Internal Medicine

## 2015-05-29 DIAGNOSIS — R918 Other nonspecific abnormal finding of lung field: Secondary | ICD-10-CM | POA: Diagnosis not present

## 2015-05-29 DIAGNOSIS — R938 Abnormal findings on diagnostic imaging of other specified body structures: Secondary | ICD-10-CM

## 2015-05-29 DIAGNOSIS — R9389 Abnormal findings on diagnostic imaging of other specified body structures: Secondary | ICD-10-CM

## 2015-05-29 DIAGNOSIS — J189 Pneumonia, unspecified organism: Secondary | ICD-10-CM | POA: Diagnosis not present

## 2015-05-30 DIAGNOSIS — R9389 Abnormal findings on diagnostic imaging of other specified body structures: Secondary | ICD-10-CM | POA: Insufficient documentation

## 2015-05-30 DIAGNOSIS — J189 Pneumonia, unspecified organism: Secondary | ICD-10-CM | POA: Insufficient documentation

## 2015-05-30 NOTE — Progress Notes (Signed)
Subjective:    Patient ID: [name], male    DOB: 04/25/1927, 82 y.o.   MRN: [id number]  HPI  Here to f/u with wife present after recent CAP with abnormal cxr, with recommendation for f/u cxr at 4 wks given the content of the abnormality. cxr jan 27 -  IMPRESSION: Interval development of left lower lobe pneumonia. No residual right lower lobe atelectasis is demonstrated.  Followup PA and lateral chest X-ray is recommended in 3-4 weeks following trial of antibiotic therapy to ensure resolution and exclude underlying malignancy  Since that date has completed antibiotic course with resolution of symptoms, no cough, ST, and Pt denies chest pain, increased sob or doe, wheezing, orthopnea, PND, increased LE swelling, palpitations, dizziness or syncope.  Overall good compliance with treatment, and good medicine tolerability, including the inhalers.   Past Medical History  Diagnosis Date  . ABNORMAL ELECTROCARDIOGRAM 06/21/2007  . ALLERGIC RHINITIS 03/23/2007  . ASTHMATIC BRONCHITIS, ACUTE 04/27/2007  . BENIGN PROSTATIC HYPERTROPHY 03/23/2007  . BRONCHITIS NOT SPECIFIED AS ACUTE OR CHRONIC 04/21/2007  . BURSITIS, RIGHT HIP 07/31/2008  . Cough 01/29/2009  . ERECTILE DYSFUNCTION, ORGANIC 04/17/2009  . ERECTILE DYSFUNCTION 03/23/2007  . FREQUENCY, URINARY 04/26/2010  . GERD 03/23/2007  . HIATAL HERNIA   . HYPERLIPIDEMIA 03/23/2007  . HYPERTENSION 03/23/2007  . MILD COGNITIVE IMPAIRMENT SO STATED 05/19/2010  . NEPHROLITHIASIS, HX OF 03/23/2007  . PSA, INCREASED 06/27/2008  . RASH-NONVESICULAR 03/23/2007  . REACTIVE AIRWAY DISEASE 01/14/2010  . SCHATZKI'S RING   . SCIATICA, RIGHT 04/28/2008  . Wheezing 04/17/2009  . Dementia 09/01/2010  . Anxiety state, unspecified 09/06/2013  . Unspecified hypothyroidism 09/06/2013  . Esophageal stricture    Past Surgical History  Procedure Laterality Date  . Tonsillectomy      reports that he has quit smoking. He has never used smokeless tobacco. He  reports that he does not drink alcohol or use illicit drugs. family history includes Cancer in his brother; Emphysema in his brother. Allergies  Allergen Reactions  . Atorvastatin     REACTION: myalgia  . Crestor [Rosuvastatin] Other (See Comments)    fatigue  . Doxazosin Mesylate     REACTION: dizziness   Current Outpatient Prescriptions on File Prior to Visit  Medication Sig Dispense Refill  . albuterol (PROVENTIL HFA;VENTOLIN HFA) 108 (90 Base) MCG/ACT inhaler Inhale 2 puffs into the lungs every 6 (six) hours as needed for wheezing. 1 Inhaler 11  . ALPRAZolam (XANAX) 0.5 MG tablet Take 1 tablet (0.5 mg total) by mouth daily as needed for anxiety. 30 tablet 2  . benzonatate (TESSALON) 100 MG capsule 1-2 tabs by mouth three times per day as needed for cough 60 capsule 1  . budesonide-formoterol (SYMBICORT) 160-4.5 MCG/ACT inhaler Inhale 2 puffs into the lungs 2 (two) times daily. 1 Inhaler 12  . Cholecalciferol (VITAMIN D) 1000 UNITS capsule Take 1,000 Units by mouth daily.      . cyanocobalamin 1000 MCG tablet Take 100 mcg by mouth daily.    . lansoprazole (PREVACID) 30 MG capsule Take 1 capsule (30 mg total) by mouth daily. 90 capsule 3  . levothyroxine (SYNTHROID, LEVOTHROID) 100 MCG tablet Take 1 tablet (100 mcg total) by mouth daily. 90 tablet 3  . losartan (COZAAR) 50 MG tablet Take 0.5 tablets (25 mg total) by mouth daily. 45 tablet 3  . mometasone-formoterol (DULERA) 200-5 MCG/ACT AERO Inhale 2 puffs into the lungs 2 (two) times daily. 13 g 11  . Multiple Vitamins-Minerals (CENTRUM SILVER  PO) Take by mouth daily.      . Omega-3 Fatty Acids (FISH OIL) 1200 MG CAPS Take by mouth daily.      . ondansetron (ZOFRAN) 4 MG tablet Take 1 tablet (4 mg total) by mouth every 8 (eight) hours as needed for nausea or vomiting. 30 tablet 0  . polyethylene glycol powder (MIRALAX) powder Take 1/2 capful by mouth once daily 850 g 5  . pravastatin (PRAVACHOL) 40 MG tablet   0  . prednisoLONE  acetate (PRED FORTE) 1 % ophthalmic suspension INSTILL 1 DROP TO OPERATIVE EYE 4 TIMES DAILY.  0  . tamsulosin (FLOMAX) 0.4 MG CAPS capsule take 1 capsule by mouth once daily 90 capsule 3  . vitamin C (ASCORBIC ACID) 500 MG tablet Take 500 mg by mouth daily.     No current facility-administered medications on file prior to visit.   Review of Systems  Constitutional: Negative for unusual diaphoresis or night sweats HENT: Negative for ringing in ear or discharge Eyes: Negative for double vision or worsening visual disturbance.  Respiratory: Negative for choking and stridor.   Gastrointestinal: Negative for vomiting or other signifcant bowel change Genitourinary: Negative for hematuria or change in urine volume.  Musculoskeletal: Negative for other MSK pain or swelling Skin: Negative for color change and worsening wound.  Neurological: Negative for tremors and numbness other than noted  Psychiatric/Behavioral: Negative for decreased concentration or agitation other than above       Objective:   Physical Exam BP 112/60 mmHg  Pulse 90  Temp(Src) 97.9 F (36.6 C) (Oral)  Resp 20  Wt 205 lb (92.987 kg)  SpO2 93% VS noted,  Constitutional: Pt appears in no significant distress HENT: Head: NCAT.  Right Ear: External ear normal.  Left Ear: External ear normal.  Eyes: . Pupils are equal, round, and reactive to light. Conjunctivae and EOM are normal Neck: Normal range of motion. Neck supple.  Cardiovascular: Normal rate and regular rhythm.   Pulmonary/Chest: Effort normal and breath sounds without rales or wheezing.  Neurological: Pt is alert. Not confused , motor grossly intact Skin: Skin is warm. No rash, no LE edema Psychiatric: Pt behavior is normal. No agitation.     Assessment & Plan:

## 2015-05-30 NOTE — Assessment & Plan Note (Signed)
stable overall by history and exam, recent data reviewed with pt, and pt to continue medical treatment as before,  to f/u any worsening symptoms or concerns SpO2 Readings from Last 3 Encounters:  05/28/15 93%  04/30/15 93%  04/22/15 91%

## 2015-05-30 NOTE — Assessment & Plan Note (Signed)
I think low likelihood of malignancy or persistent PNA given exam, but will need f/u cxr, to f/u any worsening symptoms or concerns

## 2015-05-30 NOTE — Assessment & Plan Note (Signed)
Clinically resolved, no further antibx or tx needed,  to f/u any worsening symptoms or concerns

## 2015-07-16 DIAGNOSIS — H04123 Dry eye syndrome of bilateral lacrimal glands: Secondary | ICD-10-CM | POA: Diagnosis not present

## 2015-07-29 ENCOUNTER — Ambulatory Visit (INDEPENDENT_AMBULATORY_CARE_PROVIDER_SITE_OTHER): Payer: PPO | Admitting: Internal Medicine

## 2015-07-29 ENCOUNTER — Encounter: Payer: Self-pay | Admitting: Internal Medicine

## 2015-07-29 ENCOUNTER — Other Ambulatory Visit (INDEPENDENT_AMBULATORY_CARE_PROVIDER_SITE_OTHER): Payer: PPO

## 2015-07-29 VITALS — BP 128/80 | HR 95 | Temp 97.8°F | Resp 20 | Wt 204.0 lb

## 2015-07-29 DIAGNOSIS — Z0001 Encounter for general adult medical examination with abnormal findings: Secondary | ICD-10-CM

## 2015-07-29 DIAGNOSIS — R6889 Other general symptoms and signs: Secondary | ICD-10-CM

## 2015-07-29 DIAGNOSIS — R7989 Other specified abnormal findings of blood chemistry: Secondary | ICD-10-CM

## 2015-07-29 DIAGNOSIS — E890 Postprocedural hypothyroidism: Secondary | ICD-10-CM | POA: Diagnosis not present

## 2015-07-29 DIAGNOSIS — I10 Essential (primary) hypertension: Secondary | ICD-10-CM | POA: Diagnosis not present

## 2015-07-29 DIAGNOSIS — F0390 Unspecified dementia without behavioral disturbance: Secondary | ICD-10-CM | POA: Diagnosis not present

## 2015-07-29 DIAGNOSIS — J4531 Mild persistent asthma with (acute) exacerbation: Secondary | ICD-10-CM

## 2015-07-29 DIAGNOSIS — Z0000 Encounter for general adult medical examination without abnormal findings: Secondary | ICD-10-CM | POA: Insufficient documentation

## 2015-07-29 LAB — CBC WITH DIFFERENTIAL/PLATELET
BASOS ABS: 0.1 10*3/uL (ref 0.0–0.1)
Basophils Relative: 0.7 % (ref 0.0–3.0)
Eosinophils Absolute: 0.3 10*3/uL (ref 0.0–0.7)
Eosinophils Relative: 3.4 % (ref 0.0–5.0)
HEMATOCRIT: 39.9 % (ref 39.0–52.0)
Hemoglobin: 13.6 g/dL (ref 13.0–17.0)
LYMPHS PCT: 30 % (ref 12.0–46.0)
Lymphs Abs: 2.5 10*3/uL (ref 0.7–4.0)
MCHC: 34.2 g/dL (ref 30.0–36.0)
MCV: 92.6 fl (ref 78.0–100.0)
MONOS PCT: 5.6 % (ref 3.0–12.0)
Monocytes Absolute: 0.5 10*3/uL (ref 0.1–1.0)
Neutro Abs: 5 10*3/uL (ref 1.4–7.7)
Neutrophils Relative %: 60.3 % (ref 43.0–77.0)
Platelets: 249 10*3/uL (ref 150.0–400.0)
RBC: 4.3 Mil/uL (ref 4.22–5.81)
RDW: 13.2 % (ref 11.5–15.5)
WBC: 8.3 10*3/uL (ref 4.0–10.5)

## 2015-07-29 LAB — URINALYSIS, ROUTINE W REFLEX MICROSCOPIC
BILIRUBIN URINE: NEGATIVE
Hgb urine dipstick: NEGATIVE
KETONES UR: NEGATIVE
LEUKOCYTES UA: NEGATIVE
Nitrite: NEGATIVE
PH: 6.5 (ref 5.0–8.0)
Specific Gravity, Urine: 1.01 (ref 1.000–1.030)
Total Protein, Urine: NEGATIVE
URINE GLUCOSE: NEGATIVE
UROBILINOGEN UA: 0.2 (ref 0.0–1.0)

## 2015-07-29 LAB — LIPID PANEL
Cholesterol: 212 mg/dL — ABNORMAL HIGH (ref 0–200)
HDL: 43 mg/dL (ref >39.00)
NONHDL: 168.51
TRIGLYCERIDES: 255 mg/dL — AB (ref 0.0–149.0)
Total CHOL/HDL Ratio: 5
VLDL: 51 mg/dL — ABNORMAL HIGH (ref 0.0–40.0)

## 2015-07-29 LAB — TSH: TSH: 9.12 u[IU]/mL — ABNORMAL HIGH (ref 0.35–4.50)

## 2015-07-29 LAB — BASIC METABOLIC PANEL
BUN: 28 mg/dL — ABNORMAL HIGH (ref 6–23)
CALCIUM: 9.7 mg/dL (ref 8.4–10.5)
CO2: 32 meq/L (ref 19–32)
CREATININE: 1.55 mg/dL — AB (ref 0.40–1.50)
Chloride: 104 mEq/L (ref 96–112)
GFR: 45.48 mL/min — ABNORMAL LOW (ref >60.00)
GLUCOSE: 119 mg/dL — AB (ref 70–99)
Potassium: 4.9 mEq/L (ref 3.5–5.1)
Sodium: 142 mEq/L (ref 135–145)

## 2015-07-29 LAB — HEPATIC FUNCTION PANEL
ALBUMIN: 4.2 g/dL (ref 3.5–5.2)
ALT: 12 U/L (ref 0–53)
AST: 18 U/L (ref 0–37)
Alkaline Phosphatase: 65 U/L (ref 39–117)
Bilirubin, Direct: 0.1 mg/dL (ref 0.0–0.3)
TOTAL PROTEIN: 7.1 g/dL (ref 6.0–8.3)
Total Bilirubin: 0.8 mg/dL (ref 0.2–1.2)

## 2015-07-29 LAB — LDL CHOLESTEROL, DIRECT: LDL DIRECT: 142 mg/dL

## 2015-07-29 MED ORDER — PREDNISONE 10 MG PO TABS: ORAL_TABLET | ORAL | Status: DC

## 2015-07-29 MED ORDER — LEVOTHYROXINE SODIUM 100 MCG PO TABS: 100.0000 ug | ORAL_TABLET | Freq: Every day | ORAL | Status: DC

## 2015-07-29 NOTE — Patient Instructions (Signed)
You had the steroid shot today  Please take all new medication as prescribed - the prednisone  Please continue all other medications as before, and please check on the doses of the thyroid medication you have been taking recently  Please have the pharmacy call with any other refills you may need.  Please continue your efforts at being more active, low cholesterol diet, and weight control.  You are otherwise up to date with prevention measures today.  Please keep your appointments with your specialists as you may have planned  Please go to the LAB in the Basement (turn left off the elevator) for the tests to be done today  You will be contacted by phone if any changes need to be made immediately.  Otherwise, you will receive a letter about your results with an explanation, but please check with MyChart first.  Please remember to sign up for MyChart if you have not done so, as this will be important to you in the future with finding out test results, communicating by private email, and scheduling acute appointments online when needed.  Please return in 6 months, or sooner if needed

## 2015-07-29 NOTE — Assessment & Plan Note (Signed)
With increased synthroid to 100 mcg last visit, but pt states today he only has 75 and 25 bottles at home, for lab recheck today

## 2015-07-29 NOTE — Assessment & Plan Note (Addendum)
Mild c/w seasonall allergy related flare likely,  for depomedrol IM, predpac asd, cont both inhalers as before  to f/u any worsening symptoms or concerns  In addition to the time spent performing CPE, I spent an additional 40 minutes face to face,in which greater than 50% of this time was spent in counseling and coordination of care for patient's acute illness as documented.

## 2015-07-29 NOTE — Progress Notes (Signed)
Subjective:    Patient ID: Stephen Wilkins, male    DOB: September 23, 1927, 82 y.o.   MRN: [id number]  HPI  Here for wellness and f/u with wife to help with hx  Overall doing ok;  Pt denies Chest pain, orthopnea, PND, worsening LE edema, palpitations, dizziness or syncope but has had 2 wks worsening sob/non prod cough/wheezing x 2 wks, similar to the past, with inhalers not working as well, using the rescue inhaler more frequently.  Pt denies neurological change such as new headache, facial or extremity weakness.  Pt denies polydipsia, polyuria, or low sugar symptoms. Pt states overall good compliance with treatment and medications, good tolerability, and has been trying to follow appropriate diet.  Pt denies worsening depressive symptoms, suicidal ideation or panic. No fever, night sweats, wt loss, loss of appetite, or other constitutional symptoms.  Pt states good ability with ADL's, has low fall risk, home safety reviewed and adequate, no other significant changes in hearing or vision, and only occasionally active with exercise. Dementia overall stable symptomatically, and not assoc with behavioral changes such as hallucinations, paranoia, or agitation. Denies hyper or hypo thyroid symptoms such as voice, skin or hair change, though admits to not sure about the dose he takes, and may not be taking it every dayt Past Medical History  Diagnosis Date  . ABNORMAL ELECTROCARDIOGRAM 06/21/2007  . ALLERGIC RHINITIS 03/23/2007  . ASTHMATIC BRONCHITIS, ACUTE 04/27/2007  . BENIGN PROSTATIC HYPERTROPHY 03/23/2007  . BRONCHITIS NOT SPECIFIED AS ACUTE OR CHRONIC 04/21/2007  . BURSITIS, RIGHT HIP 07/31/2008  . Cough 01/29/2009  . ERECTILE DYSFUNCTION, ORGANIC 04/17/2009  . ERECTILE DYSFUNCTION 03/23/2007  . FREQUENCY, URINARY 04/26/2010  . GERD 03/23/2007  . HIATAL HERNIA   . HYPERLIPIDEMIA 03/23/2007  . HYPERTENSION 03/23/2007  . MILD COGNITIVE IMPAIRMENT SO STATED 05/19/2010  . NEPHROLITHIASIS, HX OF 03/23/2007  .  PSA, INCREASED 06/27/2008  . RASH-NONVESICULAR 03/23/2007  . REACTIVE AIRWAY DISEASE 01/14/2010  . SCHATZKI'S RING   . SCIATICA, RIGHT 04/28/2008  . Wheezing 04/17/2009  . Dementia 09/01/2010  . Anxiety state, unspecified 09/06/2013  . Unspecified hypothyroidism 09/06/2013  . Esophageal stricture    Past Surgical History  Procedure Laterality Date  . Tonsillectomy      reports that he has quit smoking. He has never used smokeless tobacco. He reports that he does not drink alcohol or use illicit drugs. family history includes Cancer in his brother; Emphysema in his brother. Allergies  Allergen Reactions  . Atorvastatin     REACTION: myalgia  . Crestor [Rosuvastatin] Other (See Comments)    fatigue  . Doxazosin Mesylate     REACTION: dizziness   Current Outpatient Prescriptions on File Prior to Visit  Medication Sig Dispense Refill  . albuterol (PROVENTIL HFA;VENTOLIN HFA) 108 (90 Base) MCG/ACT inhaler Inhale 2 puffs into the lungs every 6 (six) hours as needed for wheezing. 1 Inhaler 11  . ALPRAZolam (XANAX) 0.5 MG tablet Take 1 tablet (0.5 mg total) by mouth daily as needed for anxiety. 30 tablet 2  . benzonatate (TESSALON) 100 MG capsule 1-2 tabs by mouth three times per day as needed for cough 60 capsule 1  . budesonide-formoterol (SYMBICORT) 160-4.5 MCG/ACT inhaler Inhale 2 puffs into the lungs 2 (two) times daily. 1 Inhaler 12  . Cholecalciferol (VITAMIN D) 1000 UNITS capsule Take 1,000 Units by mouth daily.      . cyanocobalamin 1000 MCG tablet Take 100 mcg by mouth daily.    Marland Kitchen desoximetasone (TOPICORT) 0.25 %  cream apply topically to affected area twice a day as needed 30 g 0  . lansoprazole (PREVACID) 30 MG capsule Take 1 capsule (30 mg total) by mouth daily. 90 capsule 3  . levothyroxine (SYNTHROID, LEVOTHROID) 100 MCG tablet Take 1 tablet (100 mcg total) by mouth daily. 90 tablet 3  . losartan (COZAAR) 50 MG tablet Take 0.5 tablets (25 mg total) by mouth daily. 45 tablet 3  .  mometasone-formoterol (DULERA) 200-5 MCG/ACT AERO Inhale 2 puffs into the lungs 2 (two) times daily. 13 g 11  . Multiple Vitamins-Minerals (CENTRUM SILVER PO) Take by mouth daily.      . Omega-3 Fatty Acids (FISH OIL) 1200 MG CAPS Take by mouth daily.      . ondansetron (ZOFRAN) 4 MG tablet Take 1 tablet (4 mg total) by mouth every 8 (eight) hours as needed for nausea or vomiting. 30 tablet 0  . polyethylene glycol powder (MIRALAX) powder Take 1/2 capful by mouth once daily 850 g 5  . pravastatin (PRAVACHOL) 40 MG tablet   0  . tamsulosin (FLOMAX) 0.4 MG CAPS capsule take 1 capsule by mouth once daily 90 capsule 3  . vitamin C (ASCORBIC ACID) 500 MG tablet Take 500 mg by mouth daily.     No current facility-administered medications on file prior to visit.   Review of Systems Constitutional: Negative for increased diaphoresis, or other activity, appetite or siginficant weight change other than noted HENT: Negative for worsening hearing loss, ear pain, facial swelling, mouth sores and neck stiffness.   Eyes: Negative for other worsening pain, redness or visual disturbance.  Respiratory: Negative for choking or stridor Cardiovascular: Negative for other chest pain and palpitations.  Gastrointestinal: Negative for worsening diarrhea, blood in stool, or abdominal distention Genitourinary: Negative for hematuria, flank pain or change in urine volume.  Musculoskeletal: Negative for myalgias or other joint complaints.  Skin: Negative for other color change and wound or drainage.  Neurological: Negative for syncope and numbness. other than noted Hematological: Negative for adenopathy. or other swelling Psychiatric/Behavioral: Negative for hallucinations, SI, self-injury, decreased concentration or other worsening agitation.      Objective:   Physical Exam BP 128/80 mmHg  Pulse 95  Temp(Src) 97.8 F (36.6 C) (Oral)  Resp 20  Wt 204 lb (92.534 kg)  SpO2 96% VS noted,  Constitutional: Pt is  oriented to person, place, and time. Appears well-developed and well-nourished, in no significant distress Head: Normocephalic and atraumatic  Eyes: Conjunctivae and EOM are normal. Pupils are equal, round, and reactive to light Right Ear: External ear normal.  Left Ear: External ear normal Nose: Nose normal.  Mouth/Throat: Oropharynx is clear and moist  Neck: Normal range of motion. Neck supple. No JVD present. No tracheal deviation present or significant neck LA or mass Cardiovascular: Normal rate, regular rhythm, normal heart sounds and intact distal pulses.   Pulmonary/Chest: Effort normal and breath sounds decreased without rales but with few scattered wheezing  Abdominal: Soft. Bowel sounds are normal. NT. No HSM  Musculoskeletal: Normal range of motion. Exhibits no edema Lymphadenopathy: Has no cervical adenopathy.  Neurological: Pt is alert and oriented to person, place but with mild ST memory dysfxn. Pt has normal reflexes. No cranial nerve deficit. Motor grossly intact Skin: Skin is warm and dry. No rash noted or new ulcers Psychiatric:  Has normal mood and affect. Behavior is normal.     Assessment & Plan:

## 2015-07-29 NOTE — Assessment & Plan Note (Signed)

## 2015-07-29 NOTE — Progress Notes (Signed)
Pre visit review using our clinic review tool, if applicable. No additional management support is needed unless otherwise documented below in the visit note. 

## 2015-08-02 NOTE — Assessment & Plan Note (Signed)
stable overall by history and exam, recent data reviewed with pt, and pt to continue medical treatment as before,  to f/u any worsening symptoms or concerns BP Readings from Last 3 Encounters:  07/29/15 128/80  05/28/15 112/60  04/30/15 124/86

## 2015-08-02 NOTE — Assessment & Plan Note (Signed)
stable overall by history and exam, recent data reviewed with pt, and pt to continue medical treatment as before,  to f/u any worsening symptoms or concerns Lab Results  Component Value Date   WBC 8.3 07/29/2015   HGB 13.6 07/29/2015   HCT 39.9 07/29/2015   PLT 249.0 07/29/2015   GLUCOSE 119* 07/29/2015   CHOL 212* 07/29/2015   TRIG 255.0* 07/29/2015   HDL 43.00 07/29/2015   LDLDIRECT 142.0 07/29/2015   LDLCALC 134* 07/29/2014   ALT 12 07/29/2015   AST 18 07/29/2015   NA 142 07/29/2015   K 4.9 07/29/2015   CL 104 07/29/2015   CREATININE 1.55* 07/29/2015   BUN 28* 07/29/2015   CO2 32 07/29/2015   TSH 9.12* 07/29/2015   PSA 3.94 03/12/2014

## 2015-10-15 DIAGNOSIS — N401 Enlarged prostate with lower urinary tract symptoms: Secondary | ICD-10-CM | POA: Diagnosis not present

## 2015-10-15 DIAGNOSIS — R351 Nocturia: Secondary | ICD-10-CM | POA: Diagnosis not present

## 2015-11-23 DIAGNOSIS — H04123 Dry eye syndrome of bilateral lacrimal glands: Secondary | ICD-10-CM | POA: Diagnosis not present

## 2015-12-08 IMAGING — RF DG ESOPHAGUS
14 of 24 series · 14 of 24 positions shown · non-contrast
Comparison: None.

CLINICAL DATA: History of esophageal stricture with emesis.

EXAM:
ESOPHOGRAM / BARIUM SWALLOW / BARIUM TABLET STUDY
TECHNIQUE: Combined double contrast and single contrast examination performed
using effervescent crystals, thick barium liquid, and thin barium
liquid. The patient was observed with fluoroscopy swallowing a 13 mm
barium sulphate tablet.
FLUOROSCOPY TIME:  Fluoroscopy Time:  2 minutes and 36 seconds
Number of Acquired Images:  0

[Series 1: run · 1 of 1 slices shown (1 of 14)]
[im 1/1]
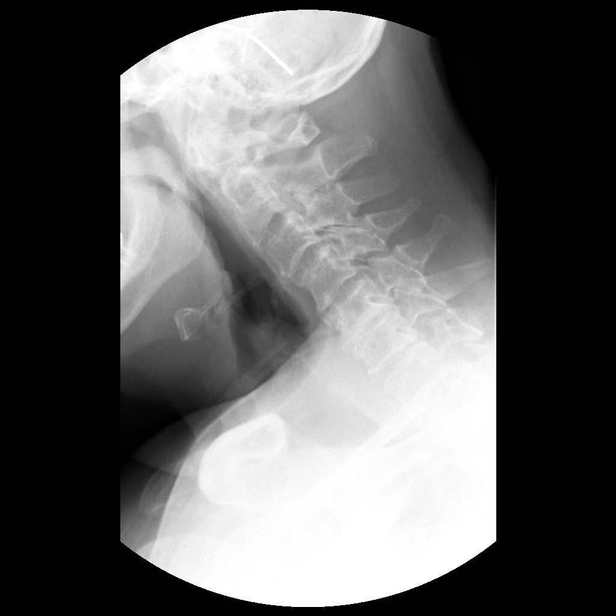

[Series 3: run · 1 of 1 slices shown (2 of 14)]
[im 1/1]
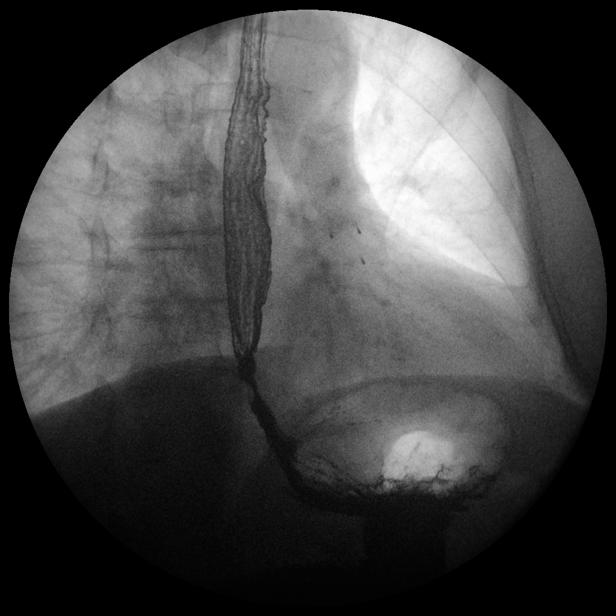

[Series 5: run · 1 of 1 slices shown (3 of 14)]
[im 1/1]
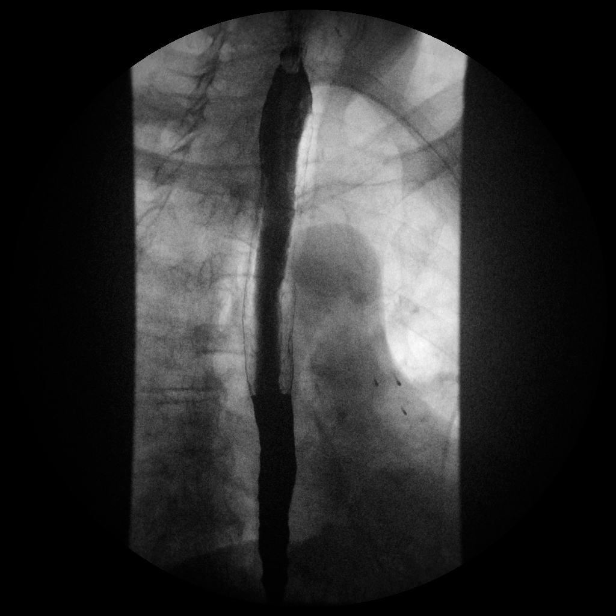

[Series 7: run · 1 of 1 slices shown (4 of 14)]
[im 1/1]
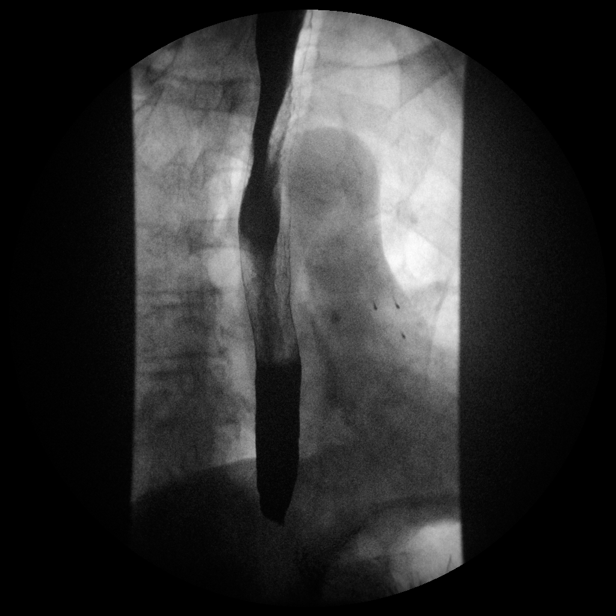

[Series 8: run · 1 of 1 slices shown (5 of 14)]
[im 1/1]
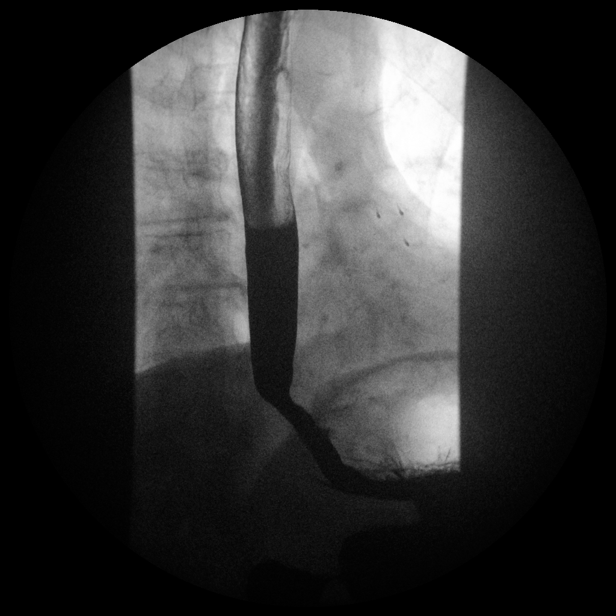

[Series 10: run · 1 of 1 slices shown (6 of 14)]
[im 1/1]
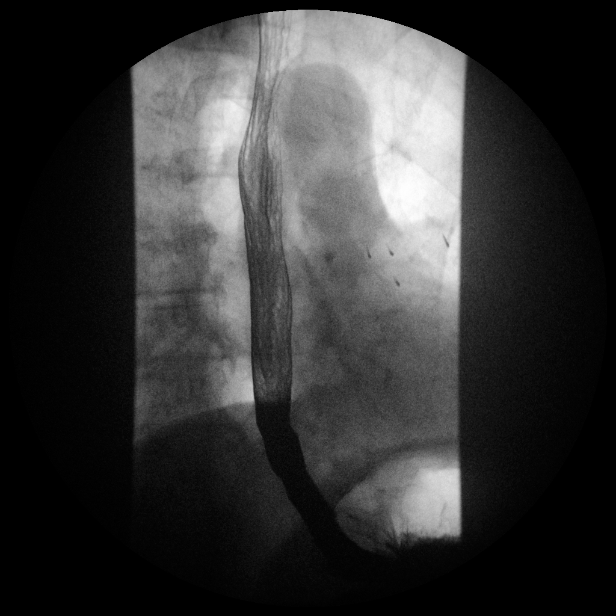

[Series 12: run · 1 of 1 slices shown (7 of 14)]
[im 1/1]
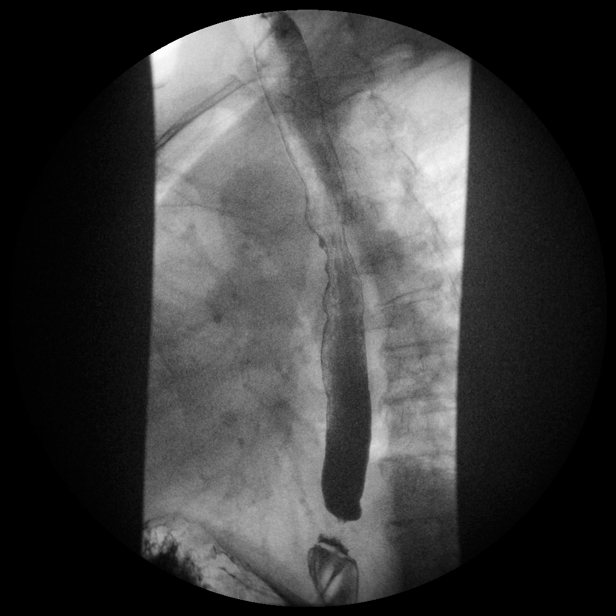

[Series 13: run · 1 of 1 slices shown (8 of 14)]
[im 1/1]
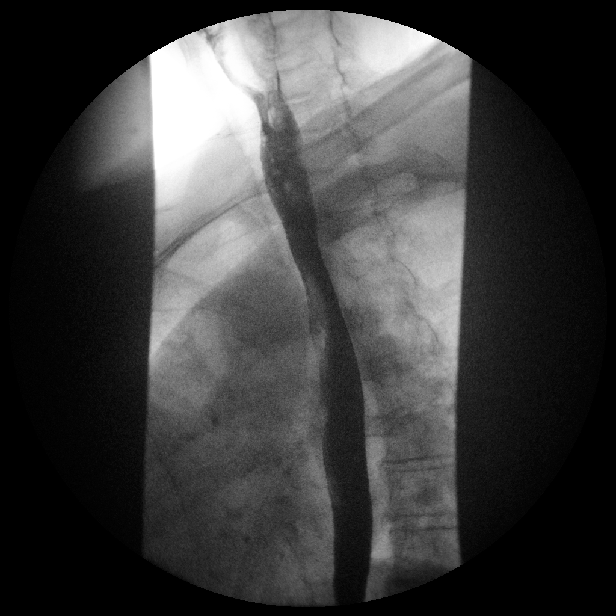

[Series 15: run · 1 of 1 slices shown (9 of 14)]
[im 1/1]
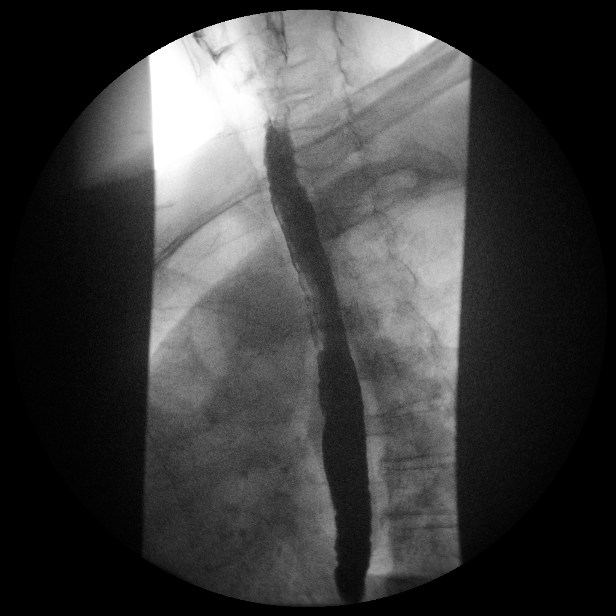

[Series 17: run · 1 of 1 slices shown (10 of 14)]
[im 1/1]
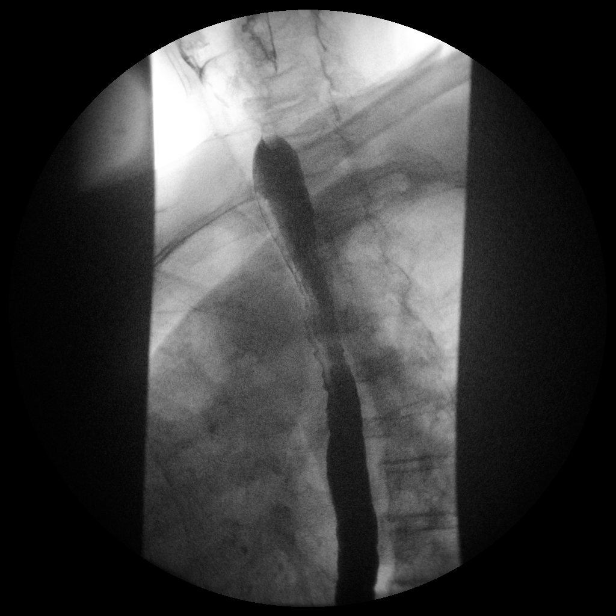

[Series 19: run · 1 of 1 slices shown (11 of 14)]
[im 1/1]
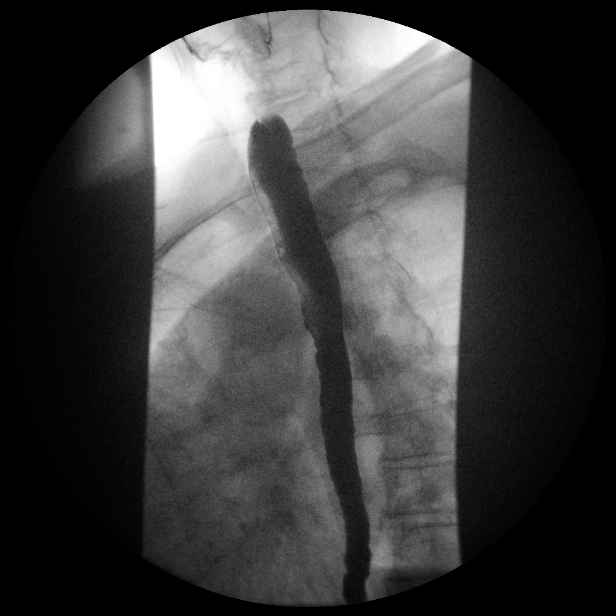

[Series 20: run · 1 of 1 slices shown (12 of 14)]
[im 1/1]
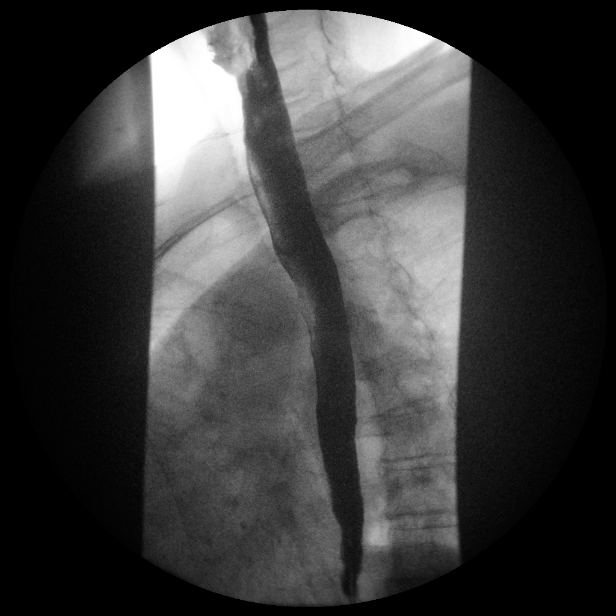

[Series 22: run · 1 of 1 slices shown (13 of 14)]
[im 1/1]
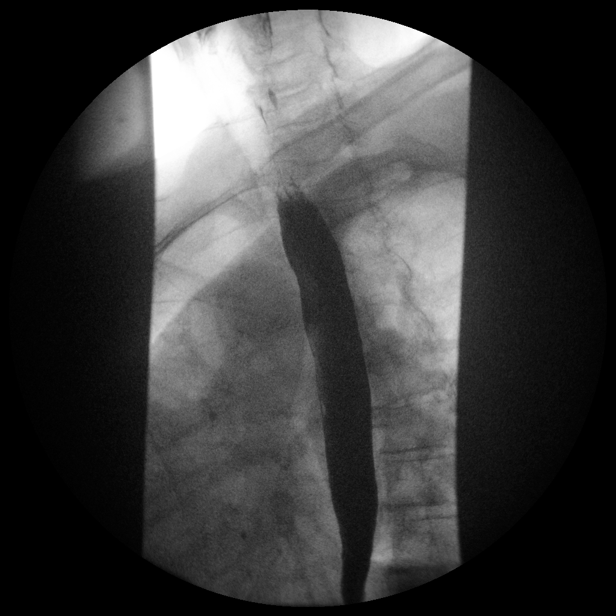

[Series 24: run · 1 of 1 slices shown (14 of 14)]
[im 1/1]
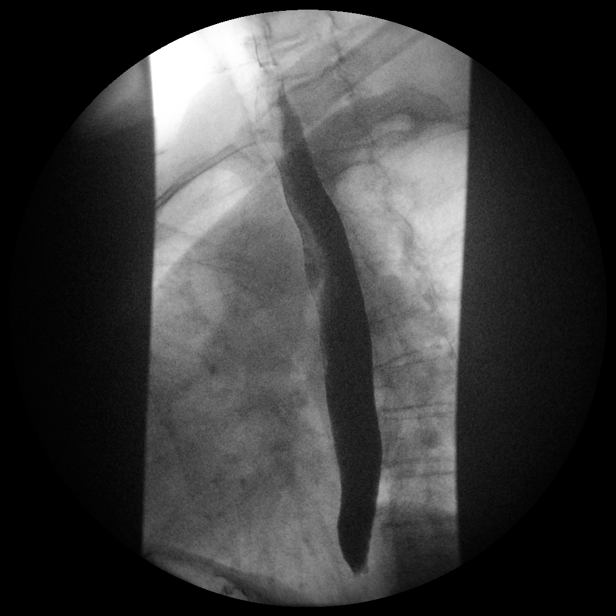

[14 of 24 positions shown; findings below may reference images not displayed]

FINDINGS: Hypopharyngeal portion of the exam is unremarkable.

Double contrast evaluation of the esophagus demonstrates no mucosal
abnormality.

Evaluation of esophageal motility demonstrates mild dysmotility, as
evidenced by an incomplete primary peristaltic wave and contrast
stasis in the mid and lower thoracic esophagus.

Full, evaluation the esophagus demonstrates a small hiatal hernia.
Mucosal ring at the gastroesophageal junction is not restrictive. A
13 mm barium tablet passes promptly.
IMPRESSION: 1. Mild esophageal dysmotility, likely presbyesophagus.
2. Small hiatal hernia.

## 2015-12-09 DIAGNOSIS — L820 Inflamed seborrheic keratosis: Secondary | ICD-10-CM | POA: Diagnosis not present

## 2015-12-09 DIAGNOSIS — L920 Granuloma annulare: Secondary | ICD-10-CM | POA: Diagnosis not present

## 2015-12-09 DIAGNOSIS — L570 Actinic keratosis: Secondary | ICD-10-CM | POA: Diagnosis not present

## 2015-12-11 DIAGNOSIS — H524 Presbyopia: Secondary | ICD-10-CM | POA: Diagnosis not present

## 2015-12-11 DIAGNOSIS — Z9842 Cataract extraction status, left eye: Secondary | ICD-10-CM | POA: Diagnosis not present

## 2015-12-11 DIAGNOSIS — H1859 Other hereditary corneal dystrophies: Secondary | ICD-10-CM | POA: Diagnosis not present

## 2015-12-11 DIAGNOSIS — H04123 Dry eye syndrome of bilateral lacrimal glands: Secondary | ICD-10-CM | POA: Diagnosis not present

## 2015-12-11 DIAGNOSIS — Z9841 Cataract extraction status, right eye: Secondary | ICD-10-CM | POA: Diagnosis not present

## 2015-12-11 DIAGNOSIS — Z961 Presence of intraocular lens: Secondary | ICD-10-CM | POA: Diagnosis not present

## 2015-12-11 DIAGNOSIS — H11153 Pinguecula, bilateral: Secondary | ICD-10-CM | POA: Diagnosis not present

## 2015-12-22 ENCOUNTER — Other Ambulatory Visit: Payer: Self-pay | Admitting: Internal Medicine

## 2016-01-18 ENCOUNTER — Other Ambulatory Visit: Payer: Self-pay | Admitting: Internal Medicine

## 2016-01-28 ENCOUNTER — Ambulatory Visit: Payer: PPO | Admitting: Internal Medicine

## 2016-02-02 DIAGNOSIS — L920 Granuloma annulare: Secondary | ICD-10-CM | POA: Diagnosis not present

## 2016-02-02 DIAGNOSIS — L820 Inflamed seborrheic keratosis: Secondary | ICD-10-CM | POA: Diagnosis not present

## 2016-02-02 DIAGNOSIS — L219 Seborrheic dermatitis, unspecified: Secondary | ICD-10-CM | POA: Diagnosis not present

## 2016-02-10 ENCOUNTER — Other Ambulatory Visit: Payer: PPO

## 2016-02-10 ENCOUNTER — Encounter: Payer: Self-pay | Admitting: Internal Medicine

## 2016-02-10 ENCOUNTER — Ambulatory Visit (INDEPENDENT_AMBULATORY_CARE_PROVIDER_SITE_OTHER): Payer: PPO | Admitting: Internal Medicine

## 2016-02-10 VITALS — BP 124/78 | HR 81 | Temp 98.2°F | Resp 20 | Wt 205.0 lb

## 2016-02-10 DIAGNOSIS — N189 Chronic kidney disease, unspecified: Secondary | ICD-10-CM | POA: Diagnosis not present

## 2016-02-10 DIAGNOSIS — I10 Essential (primary) hypertension: Secondary | ICD-10-CM

## 2016-02-10 DIAGNOSIS — E785 Hyperlipidemia, unspecified: Secondary | ICD-10-CM

## 2016-02-10 DIAGNOSIS — R739 Hyperglycemia, unspecified: Secondary | ICD-10-CM

## 2016-02-10 MED ORDER — LEVOTHYROXINE SODIUM 100 MCG PO TABS: 100.0000 ug | ORAL_TABLET | Freq: Every day | ORAL | 3 refills | Status: DC

## 2016-02-10 MED ORDER — ROSUVASTATIN CALCIUM 10 MG PO TABS: 10.0000 mg | ORAL_TABLET | Freq: Every day | ORAL | 3 refills | Status: DC

## 2016-02-10 MED ORDER — DESOXIMETASONE 0.25 % EX CREA: TOPICAL_CREAM | CUTANEOUS | 1 refills | Status: DC

## 2016-02-10 MED ORDER — ASPIRIN EC 81 MG PO TBEC: 81.0000 mg | DELAYED_RELEASE_TABLET | Freq: Every day | ORAL | 11 refills | Status: DC

## 2016-02-10 NOTE — Assessment & Plan Note (Signed)
Asympt, mild, for a1c, cont to work on diet and wt control,   to f/u any worsening symptoms or concerns,  to f/u any worsening symptoms or concerns Lab Results  Component Value Date   WBC 8.3 07/29/2015   HGB 13.6 07/29/2015   HCT 39.9 07/29/2015   PLT 249.0 07/29/2015   GLUCOSE 119 (H) 07/29/2015   CHOL 212 (H) 07/29/2015   TRIG 255.0 (H) 07/29/2015   HDL 43.00 07/29/2015   LDLDIRECT 142.0 07/29/2015   LDLCALC 134 (H) 07/29/2014   ALT 12 07/29/2015   AST 18 07/29/2015   NA 142 07/29/2015   K 4.9 07/29/2015   CL 104 07/29/2015   CREATININE 1.55 (H) 07/29/2015   BUN 28 (H) 07/29/2015   CO2 32 07/29/2015   TSH 9.12 (H) 07/29/2015   PSA 3.94 03/12/2014

## 2016-02-10 NOTE — Progress Notes (Signed)
Pre visit review using our clinic review tool, if applicable. No additional management support is needed unless otherwise documented below in the visit note. 

## 2016-02-10 NOTE — Assessment & Plan Note (Signed)
stable overall by history and exam, recent data reviewed with pt, and pt to continue medical treatment as before,  to f/u any worsening symptoms or concerns Lab Results  Component Value Date   CREATININE 1.55 (H) 07/29/2015

## 2016-02-10 NOTE — Assessment & Plan Note (Addendum)
stable overall by history and exam, and pt to start crestor 10,  to f/u any worsening symptoms or concerns, goal ldl < 100

## 2016-02-10 NOTE — Patient Instructions (Addendum)
You had the flu shot today  Ok to stop the pravastatin  Please take all new medication as prescribed - the crestor 10 qd  Please start Aspirin 81 mg - 1 per day - very important to reduce risk of stroke and heart attack  Please continue all other medications as before, and refills have been done if requested. - the cream for the eczema  Please have the pharmacy call with any other refills you may need.  Please continue your efforts at being more active, low cholesterol diet, and weight control.  Please keep your appointments with your specialists as you may have planned  Please go to the LAB in the Basement (turn left off the elevator) for the tests to be done today  You will be contacted by phone if any changes need to be made immediately.  Otherwise, you will receive a letter about your results with an explanation, but please check with MyChart first.  Please remember to sign up for MyChart if you have not done so, as this will be important to you in the future with finding out test results, communicating by private email, and scheduling acute appointments online when needed.  Please return in 6 months, or sooner if needed

## 2016-02-10 NOTE — Progress Notes (Signed)
Subjective:    Patient ID: Stephen Wilkins, male    DOB: [birth year], 82 y.o.   MRN: [id number]  HPI  Here to f/u; overall doing ok,  Pt denies chest pain, increasing sob or doe, wheezing, orthopnea, PND, increased LE swelling, palpitations, dizziness or syncope.  Pt denies new neurological symptoms such as new headache, or facial or extremity weakness or numbness.  Pt denies polydipsia, polyuria, or low sugar episode.   Pt denies new neurological symptoms such as new headache, or facial or extremity weakness or numbness.   Pt states overall good compliance with meds, mostly trying to follow appropriate diet, with wt overall stable,  And excercises  - goes to gym 4 times per wk. Denies hyper or hypo thyroid symptoms such as voice, skin or hair change.  Willing to re-try crestor again Past Medical History:  Diagnosis Date  . ABNORMAL ELECTROCARDIOGRAM 06/21/2007  . ALLERGIC RHINITIS 03/23/2007  . Anxiety state, unspecified 09/06/2013  . ASTHMATIC BRONCHITIS, ACUTE 04/27/2007  . BENIGN PROSTATIC HYPERTROPHY 03/23/2007  . BRONCHITIS NOT SPECIFIED AS ACUTE OR CHRONIC 04/21/2007  . BURSITIS, RIGHT HIP 07/31/2008  . Cough 01/29/2009  . Dementia 09/01/2010  . ERECTILE DYSFUNCTION 03/23/2007  . ERECTILE DYSFUNCTION, ORGANIC 04/17/2009  . Esophageal stricture   . FREQUENCY, URINARY 04/26/2010  . GERD 03/23/2007  . HIATAL HERNIA   . HYPERLIPIDEMIA 03/23/2007  . HYPERTENSION 03/23/2007  . MILD COGNITIVE IMPAIRMENT SO STATED 05/19/2010  . NEPHROLITHIASIS, HX OF 03/23/2007  . PSA, INCREASED 06/27/2008  . RASH-NONVESICULAR 03/23/2007  . REACTIVE AIRWAY DISEASE 01/14/2010  . SCHATZKI'S RING   . SCIATICA, RIGHT 04/28/2008  . Unspecified hypothyroidism 09/06/2013  . Wheezing 04/17/2009   Past Surgical History:  Procedure Laterality Date  . TONSILLECTOMY      reports that he has quit smoking. He has never used smokeless tobacco. He reports that he does not drink alcohol or use drugs. family history includes  Cancer in his brother; Emphysema in his brother. Allergies  Allergen Reactions  . Atorvastatin     REACTION: myalgia  . Crestor [Rosuvastatin] Other (See Comments)    fatigue  . Doxazosin Mesylate     REACTION: dizziness   Current Outpatient Prescriptions on File Prior to Visit  Medication Sig Dispense Refill  . albuterol (PROVENTIL HFA;VENTOLIN HFA) 108 (90 Base) MCG/ACT inhaler Inhale 2 puffs into the lungs every 6 (six) hours as needed for wheezing. 1 Inhaler 11  . ALPRAZolam (XANAX) 0.5 MG tablet Take 1 tablet (0.5 mg total) by mouth daily as needed for anxiety. 30 tablet 2  . benzonatate (TESSALON) 100 MG capsule 1-2 tabs by mouth three times per day as needed for cough 60 capsule 1  . budesonide-formoterol (SYMBICORT) 160-4.5 MCG/ACT inhaler Inhale 2 puffs into the lungs 2 (two) times daily. 1 Inhaler 12  . Cholecalciferol (VITAMIN D) 1000 UNITS capsule Take 1,000 Units by mouth daily.      . cyanocobalamin 1000 MCG tablet Take 100 mcg by mouth daily.    . lansoprazole (PREVACID) 30 MG capsule Take 1 capsule (30 mg total) by mouth daily. 90 capsule 3  . losartan (COZAAR) 50 MG tablet Take 0.5 tablets (25 mg total) by mouth daily. 45 tablet 3  . losartan (COZAAR) 50 MG tablet take 1 tablet by mouth once daily 90 tablet 3  . mometasone-formoterol (DULERA) 200-5 MCG/ACT AERO Inhale 2 puffs into the lungs 2 (two) times daily. 13 g 11  . Multiple Vitamins-Minerals (CENTRUM SILVER PO) Take by mouth daily.      Marland Kitchen  Omega-3 Fatty Acids (FISH OIL) 1200 MG CAPS Take by mouth daily.      . ondansetron (ZOFRAN) 4 MG tablet Take 1 tablet (4 mg total) by mouth every 8 (eight) hours as needed for nausea or vomiting. 30 tablet 0  . polyethylene glycol powder (MIRALAX) powder Take 1/2 capful by mouth once daily 850 g 5  . tamsulosin (FLOMAX) 0.4 MG CAPS capsule take 1 capsule by mouth once daily 90 capsule 3  . vitamin C (ASCORBIC ACID) 500 MG tablet Take 500 mg by mouth daily.     No current  facility-administered medications on file prior to visit.    Review of Systems  Constitutional: Negative for unusual diaphoresis or night sweats HENT: Negative for ear swelling or discharge Eyes: Negative for worsening visual haziness  Respiratory: Negative for choking and stridor.   Gastrointestinal: Negative for distension or worsening eructation Genitourinary: Negative for retention or change in urine volume.  Musculoskeletal: Negative for other MSK pain or swelling Skin: Negative for color change and worsening wound Neurological: Negative for tremors and numbness other than noted  Psychiatric/Behavioral: Negative for decreased concentration or agitation other than above   All other system neg per pt    Objective:   Physical Exam BP 124/78   Pulse 81   Temp 98.2 F (36.8 C) (Oral)   Resp 20   Wt 205 lb (93 kg)   SpO2 96%   BMI 27.80 kg/m  VS noted,  Constitutional: Pt appears in no apparent distress HENT: Head: NCAT.  Right Ear: External ear normal.  Left Ear: External ear normal.  Eyes: . Pupils are equal, round, and reactive to light. Conjunctivae and EOM are normal Neck: Normal range of motion. Neck supple.  Cardiovascular: Normal rate and regular rhythm.   Pulmonary/Chest: Effort normal and breath sounds decreased without rales or wheezing.  Neurological: Pt is alert. Not confused , motor grossly intact Skin: Skin is warm. No rash, no LE edema Psychiatric: Pt behavior is normal. No agitation.     Assessment & Plan:

## 2016-02-10 NOTE — Assessment & Plan Note (Signed)
stable overall by history and exam, recent data reviewed with pt, and pt to continue medical treatment as before,  to f/u any worsening symptoms or concerns BP Readings from Last 3 Encounters:  02/10/16 124/78  07/29/15 128/80  05/28/15 112/60

## 2016-02-15 ENCOUNTER — Other Ambulatory Visit (INDEPENDENT_AMBULATORY_CARE_PROVIDER_SITE_OTHER): Payer: PPO

## 2016-02-15 DIAGNOSIS — I10 Essential (primary) hypertension: Secondary | ICD-10-CM | POA: Diagnosis not present

## 2016-02-15 DIAGNOSIS — E785 Hyperlipidemia, unspecified: Secondary | ICD-10-CM

## 2016-02-15 DIAGNOSIS — R739 Hyperglycemia, unspecified: Secondary | ICD-10-CM

## 2016-02-15 DIAGNOSIS — N189 Chronic kidney disease, unspecified: Secondary | ICD-10-CM | POA: Diagnosis not present

## 2016-02-15 LAB — TSH: TSH: 9.12 u[IU]/mL — AB (ref 0.35–4.50)

## 2016-02-15 LAB — HEPATIC FUNCTION PANEL
ALBUMIN: 4.1 g/dL (ref 3.5–5.2)
ALK PHOS: 65 U/L (ref 39–117)
ALT: 11 U/L (ref 0–53)
AST: 18 U/L (ref 0–37)
Bilirubin, Direct: 0.2 mg/dL (ref 0.0–0.3)
TOTAL PROTEIN: 6.7 g/dL (ref 6.0–8.3)
Total Bilirubin: 1.1 mg/dL (ref 0.2–1.2)

## 2016-02-15 LAB — BASIC METABOLIC PANEL
BUN: 25 mg/dL — ABNORMAL HIGH (ref 6–23)
CHLORIDE: 105 meq/L (ref 96–112)
CO2: 31 mEq/L (ref 19–32)
CREATININE: 1.5 mg/dL (ref 0.40–1.50)
Calcium: 9.5 mg/dL (ref 8.4–10.5)
GFR: 47.17 mL/min — ABNORMAL LOW (ref >60.00)
Glucose, Bld: 98 mg/dL (ref 70–99)
POTASSIUM: 4.5 meq/L (ref 3.5–5.1)
SODIUM: 144 meq/L (ref 135–145)

## 2016-02-15 LAB — LIPID PANEL
CHOLESTEROL: 180 mg/dL (ref 0–200)
HDL: 46.5 mg/dL (ref >39.00)
LDL CALC: 113 mg/dL — AB (ref 0–99)
NonHDL: 133.72
TRIGLYCERIDES: 102 mg/dL (ref 0.0–149.0)
Total CHOL/HDL Ratio: 4
VLDL: 20.4 mg/dL (ref 0.0–40.0)

## 2016-02-15 LAB — T4, FREE: Free T4: 0.8 ng/dL (ref 0.60–1.60)

## 2016-02-17 ENCOUNTER — Other Ambulatory Visit: Payer: Self-pay | Admitting: Internal Medicine

## 2016-02-17 MED ORDER — LEVOTHYROXINE SODIUM 112 MCG PO TABS: 112.0000 ug | ORAL_TABLET | Freq: Every day | ORAL | 3 refills | Status: DC

## 2016-02-19 DIAGNOSIS — Z9841 Cataract extraction status, right eye: Secondary | ICD-10-CM | POA: Diagnosis not present

## 2016-02-19 DIAGNOSIS — H18233 Secondary corneal edema, bilateral: Secondary | ICD-10-CM | POA: Diagnosis not present

## 2016-02-19 DIAGNOSIS — Z961 Presence of intraocular lens: Secondary | ICD-10-CM | POA: Diagnosis not present

## 2016-02-19 DIAGNOSIS — H11153 Pinguecula, bilateral: Secondary | ICD-10-CM | POA: Diagnosis not present

## 2016-02-19 DIAGNOSIS — H1859 Other hereditary corneal dystrophies: Secondary | ICD-10-CM | POA: Diagnosis not present

## 2016-02-19 DIAGNOSIS — Z9842 Cataract extraction status, left eye: Secondary | ICD-10-CM | POA: Diagnosis not present

## 2016-02-23 ENCOUNTER — Ambulatory Visit (INDEPENDENT_AMBULATORY_CARE_PROVIDER_SITE_OTHER): Payer: PPO | Admitting: Internal Medicine

## 2016-02-23 ENCOUNTER — Other Ambulatory Visit: Payer: PPO

## 2016-02-23 ENCOUNTER — Encounter: Payer: Self-pay | Admitting: Internal Medicine

## 2016-02-23 ENCOUNTER — Telehealth: Payer: Self-pay

## 2016-02-23 VITALS — BP 136/78 | HR 84 | Temp 98.0°F | Resp 20 | Wt 204.0 lb

## 2016-02-23 DIAGNOSIS — R300 Dysuria: Secondary | ICD-10-CM

## 2016-02-23 DIAGNOSIS — K219 Gastro-esophageal reflux disease without esophagitis: Secondary | ICD-10-CM | POA: Diagnosis not present

## 2016-02-23 DIAGNOSIS — N189 Chronic kidney disease, unspecified: Secondary | ICD-10-CM | POA: Diagnosis not present

## 2016-02-23 DIAGNOSIS — R310 Gross hematuria: Secondary | ICD-10-CM | POA: Diagnosis not present

## 2016-02-23 DIAGNOSIS — I10 Essential (primary) hypertension: Secondary | ICD-10-CM | POA: Diagnosis not present

## 2016-02-23 MED ORDER — LANSOPRAZOLE 30 MG PO CPDR: 30.0000 mg | DELAYED_RELEASE_CAPSULE | Freq: Every day | ORAL | 3 refills | Status: DC

## 2016-02-23 MED ORDER — CIPROFLOXACIN HCL 500 MG PO TABS: 500.0000 mg | ORAL_TABLET | Freq: Two times a day (BID) | ORAL | 0 refills | Status: AC

## 2016-02-23 NOTE — Progress Notes (Signed)
Subjective:    Patient ID: Stephen Wilkins, male    DOB: 1927/08/25, 82 y.o.   MRN: [id number]  HPI  Here to f/u with wife who helps with history, with c/o gross hematuria, has occurred intermittently without pain or fever over last 2-3 days, also Denies urinary symptoms such as dysuria, frequency, urgency, flank pain, or n/v, fever, chills. Happened to have 2 pills cipro leftover from previous UTI that seemed to work, so took the 2 pills yesterday, now here today hoping for a more complete prescription.  Has hx of renal stones and lithotrypsy remotely. Pt denies chest pain, increased sob or doe, wheezing, orthopnea, PND, increased LE swelling, palpitations, dizziness or syncope.  Pt denies new neurological symptoms such as new headache, or facial or extremity weakness or numbness Dementia overall stable symptomatically, and not assoc with new behavioral changes such as hallucinations, paranoia, or agitation.  No other new history findings except also does c/o mild worsening reflux, without abd pain, dysphagia, n/v, bowel change or blood. Past Medical History:  Diagnosis Date  . ABNORMAL ELECTROCARDIOGRAM 06/21/2007  . ALLERGIC RHINITIS 03/23/2007  . Anxiety state, unspecified 09/06/2013  . ASTHMATIC BRONCHITIS, ACUTE 04/27/2007  . BENIGN PROSTATIC HYPERTROPHY 03/23/2007  . BRONCHITIS NOT SPECIFIED AS ACUTE OR CHRONIC 04/21/2007  . BURSITIS, RIGHT HIP 07/31/2008  . Cough 01/29/2009  . Dementia 09/01/2010  . ERECTILE DYSFUNCTION 03/23/2007  . ERECTILE DYSFUNCTION, ORGANIC 04/17/2009  . Esophageal stricture   . FREQUENCY, URINARY 04/26/2010  . GERD 03/23/2007  . HIATAL HERNIA   . HYPERLIPIDEMIA 03/23/2007  . HYPERTENSION 03/23/2007  . MILD COGNITIVE IMPAIRMENT SO STATED 05/19/2010  . NEPHROLITHIASIS, HX OF 03/23/2007  . PSA, INCREASED 06/27/2008  . RASH-NONVESICULAR 03/23/2007  . REACTIVE AIRWAY DISEASE 01/14/2010  . SCHATZKI'S RING   . SCIATICA, RIGHT 04/28/2008  . Unspecified hypothyroidism  09/06/2013  . Wheezing 04/17/2009   Past Surgical History:  Procedure Laterality Date  . TONSILLECTOMY      reports that he has quit smoking. He has never used smokeless tobacco. He reports that he does not drink alcohol or use drugs. family history includes Cancer in his brother; Emphysema in his brother. Allergies  Allergen Reactions  . Atorvastatin     REACTION: myalgia  . Doxazosin Mesylate     REACTION: dizziness   Review of Systems   Constitutional: Negative for unusual diaphoresis or night sweats HENT: Negative for ear swelling or discharge Eyes: Negative for worsening visual haziness  Respiratory: Negative for choking and stridor.   Gastrointestinal: Negative for distension or worsening eructation Genitourinary: Negative for retention or change in urine volume.  Musculoskeletal: Negative for other MSK pain or swelling Skin: Negative for color change and worsening wound Neurological: Negative for tremors and numbness other than noted  Psychiatric/Behavioral: Negative for decreased concentration or agitation other than above   All other system neg per pt    Objective:   Physical Exam BP 136/78   Pulse 84   Temp 98 F (36.7 C) (Oral)   Resp 20   Wt 204 lb (92.5 kg)   SpO2 91%   BMI 27.67 kg/m  VS noted, not ill appearing Constitutional: Pt appears in no apparent distress HENT: Head: NCAT.  Right Ear: External ear normal.  Left Ear: External ear normal.  Eyes: . Pupils are equal, round, and reactive to light. Conjunctivae and EOM are normal Neck: Normal range of motion. Neck supple.  Cardiovascular: Normal rate and regular rhythm.   Pulmonary/Chest: Effort normal and breath  sounds without rales or wheezing.  Abd:  Soft, NT, ND, + BS, no flank tender Neurological: Pt is alert. Not confused , motor grossly intact Skin: Skin is warm. No rash, no LE edema Psychiatric: Pt behavior is normal. No agitation.  No other new exam findings    Assessment & Plan:

## 2016-02-23 NOTE — Patient Instructions (Signed)
Please take all new medication as prescribe - the antibiotic (cipro)  Please continue all other medications as before, and refills have been done if requested - the prevacid  Please call or see urology if any further blood in the urine  Please have the pharmacy call with any other refills you may need.  Please keep your appointments with your specialists as you may have planned

## 2016-02-23 NOTE — Telephone Encounter (Signed)
Lab placed

## 2016-02-23 NOTE — Progress Notes (Signed)
Pre visit review using our clinic review tool, if applicable. No additional management support is needed unless otherwise documented below in the visit note. 

## 2016-02-25 LAB — CULTURE, URINE COMPREHENSIVE: ORGANISM ID, BACTERIA: NO GROWTH

## 2016-02-27 NOTE — Assessment & Plan Note (Signed)
stable overall by history and exam, recent data reviewed with pt, and pt to continue medical treatment as before,  to f/u any worsening symptoms or concerns Lab Results  Component Value Date   CREATININE 1.50 02/15/2016   Pt declines lab f/u today

## 2016-02-27 NOTE — Assessment & Plan Note (Signed)
Mild to mod, for prevacid asd,  to f/u any worsening symptoms or concerns

## 2016-02-27 NOTE — Assessment & Plan Note (Signed)
stable overall by history and exam, recent data reviewed with pt, and pt to continue medical treatment as before,  to f/u any worsening symptoms or concerns BP Readings from Last 3 Encounters:  02/23/16 136/78  02/10/16 124/78  07/29/15 128/80

## 2016-02-27 NOTE — Assessment & Plan Note (Signed)
For UA and culture, but advised pt best course is urology referral but refuses, to r/o malignancy, supported by wife at this time, will accede to empiric cipro though has little other s/s UTI; if cx negative would need to consider urology referral

## 2016-04-01 ENCOUNTER — Ambulatory Visit (INDEPENDENT_AMBULATORY_CARE_PROVIDER_SITE_OTHER): Payer: PPO | Admitting: Physician Assistant

## 2016-04-01 VITALS — BP 130/72 | HR 88 | Temp 97.9°F | Resp 16 | Ht 72.0 in | Wt 203.0 lb

## 2016-04-01 DIAGNOSIS — R05 Cough: Secondary | ICD-10-CM | POA: Diagnosis not present

## 2016-04-01 DIAGNOSIS — R0981 Nasal congestion: Secondary | ICD-10-CM | POA: Diagnosis not present

## 2016-04-01 DIAGNOSIS — R059 Cough, unspecified: Secondary | ICD-10-CM

## 2016-04-01 DIAGNOSIS — R062 Wheezing: Secondary | ICD-10-CM | POA: Diagnosis not present

## 2016-04-01 MED ORDER — PREDNISONE 20 MG PO TABS: ORAL_TABLET | ORAL | 0 refills | Status: DC

## 2016-04-01 MED ORDER — AZELASTINE HCL 0.15 % NA SOLN: 2.0000 | Freq: Two times a day (BID) | NASAL | 0 refills | Status: DC

## 2016-04-01 MED ORDER — BENZONATATE 100 MG PO CAPS: 100.0000 mg | ORAL_CAPSULE | Freq: Three times a day (TID) | ORAL | 0 refills | Status: DC | PRN

## 2016-04-01 NOTE — Progress Notes (Signed)
Patient ID: Stephen Wilkins, male    DOB: 1927-09-06, 82 y.o.   MRN: [id number]  PCP: Cathlean Cower, MD  Chief Complaint  Patient presents with  . Wheezing  . Hoarse  . Cough    Subjective:   Presents for evaluation of cough and wheezing. He is accompanied by his wife.  "I just need a prescription for cough and another prescription for wheezing. That's all I need."   3-4 days of illness. Coughing, occasionally productive. Nasal congestion. Wheezing. No sore throat. No ear pain. Slight chills, no fever. No nausea/vomiting/diarrhea. Somewhat SOB, "I blame that on age." Not worse than his baseline. Able to continue his regular exercise x 1 hour daily with this illness. This occurs every year this time of year. Thinks it's due to the gas heat in his home. Symptoms resolve when he goes outside.  He has asthma. Last exacerbation was 04/2015.  Isn't sure when he stopped taking Symbicort. Does not recall ever taking Dulera. Using albuterol inhaler. Effect lasts several hours.  Chart reviewed. The Symbicort was started 04/30/2015 to replace [facility], which was not covered by his insurance. Qvar also not covered. At that visit, he was also given depomedrol IM, oral predpac.  Over all, he is unclear about his medications and is difficult to obtain history.    Review of Systems As above.    Patient Active Problem List   Diagnosis Date Noted  . Hyperglycemia 02/10/2016  . Abnormal CXR 05/30/2015  . Asthma with exacerbation 04/22/2015  . Urine abnormality 04/22/2015  . Abnormal liver function test 09/11/2014  . Dysphagia 07/29/2014  . Anxiety state 09/06/2013  . CKD (chronic kidney disease) 09/06/2013  . Hypothyroidism 09/06/2013  . Rash and nonspecific skin eruption 02/27/2013  . Dizziness and giddiness 01/04/2013  . Abnormal TSH 06/04/2012  . Gross hematuria 01/09/2012  . Bruising 06/05/2011  . Dementia 09/01/2010  . Asthma 01/14/2010  . ERECTILE DYSFUNCTION, ORGANIC  04/17/2009  . FATIGUE 06/27/2008  . PSA, INCREASED 06/27/2008  . SCIATICA, RIGHT 04/28/2008  . SCHATZKI'S RING 07/18/2007  . HIATAL HERNIA 07/18/2007  . ABNORMAL ELECTROCARDIOGRAM 06/21/2007  . Hyperlipidemia 03/23/2007  . Essential hypertension 03/23/2007  . ALLERGIC RHINITIS 03/23/2007  . GERD 03/23/2007  . BENIGN PROSTATIC HYPERTROPHY 03/23/2007  . NEPHROLITHIASIS, HX OF 03/23/2007     Prior to Admission medications   Medication Sig Start Date End Date Taking? Authorizing Provider  albuterol (PROVENTIL HFA;VENTOLIN HFA) 108 (90 Base) MCG/ACT inhaler Inhale 2 puffs into the lungs every 6 (six) hours as needed for wheezing. 04/23/15 07/28/16 Yes Biagio Borg, MD  ALPRAZolam Duanne Moron) 0.5 MG tablet Take 1 tablet (0.5 mg total) by mouth daily as needed for anxiety. 07/29/14  Yes Biagio Borg, MD  aspirin EC 81 MG tablet Take 1 tablet (81 mg total) by mouth daily. 02/10/16  Yes Biagio Borg, MD  Cholecalciferol (VITAMIN D) 1000 UNITS capsule Take 1,000 Units by mouth daily.     Yes Historical Provider, MD  desoximetasone (TOPICORT) 0.25 % cream apply sparingly to affected area twice a day as directed if needed 02/10/16  Yes Biagio Borg, MD  lansoprazole (PREVACID) 30 MG capsule Take 1 capsule (30 mg total) by mouth daily. 02/23/16  Yes Biagio Borg, MD  levothyroxine (SYNTHROID, LEVOTHROID) 112 MCG tablet Take 1 tablet (112 mcg total) by mouth daily. 02/17/16  Yes Biagio Borg, MD  losartan (COZAAR) 50 MG tablet take 1 tablet by mouth once daily 12/22/15  Yes Jeneen Rinks  Quin Hoop, MD  Multiple Vitamins-Minerals (CENTRUM SILVER PO) Take by mouth daily.     Yes Historical Provider, MD  Omega-3 Fatty Acids (FISH OIL) 1200 MG CAPS Take by mouth daily.     Yes Historical Provider, MD  polyethylene glycol powder (MIRALAX) powder Take 1/2 capful by mouth once daily 01/28/15  Yes Biagio Borg, MD  rosuvastatin (CRESTOR) 10 MG tablet Take 1 tablet (10 mg total) by mouth daily. 02/10/16  Yes Biagio Borg, MD    tamsulosin [facility]) 0.4 MG CAPS capsule take 1 capsule by mouth once daily 12/23/14  Yes Biagio Borg, MD  vitamin C (ASCORBIC ACID) 500 MG tablet Take 500 mg by mouth daily.   Yes Historical Provider, MD  budesonide-formoterol (SYMBICORT) 160-4.5 MCG/ACT inhaler Inhale 2 puffs into the lungs 2 (two) times daily. Patient not taking: Reported on 04/01/2016 04/29/15   Biagio Borg, MD  cyanocobalamin 1000 MCG tablet Take 100 mcg by mouth daily.    Historical Provider, MD     Allergies  Allergen Reactions  . Atorvastatin     REACTION: myalgia  . Doxazosin Mesylate     REACTION: dizziness       Objective:  Physical Exam  Constitutional: He is oriented to person, place, and time. He appears well-developed and well-nourished. He is active and cooperative. No distress.  BP 130/72   Pulse 88   Temp 97.9 F (36.6 C) (Oral)   Resp 16   Ht 6' (1.829 m)   Wt 203 lb (92.1 kg)   SpO2 93%   BMI 27.53 kg/m   HENT:  Head: Normocephalic and atraumatic.  Right Ear: Hearing normal.  Left Ear: Hearing normal.  Eyes: Conjunctivae are normal. No scleral icterus.  Neck: Normal range of motion. Neck supple. No thyromegaly present.  Cardiovascular: Normal rate, regular rhythm and normal heart sounds.   Pulses:      Radial pulses are 2+ on the right side, and 2+ on the left side.  Pulmonary/Chest: Effort normal. He has wheezes (diffuse high-pitched musical wheezes).  Lymphadenopathy:       Head (right side): No tonsillar, no preauricular, no posterior auricular and no occipital adenopathy present.       Head (left side): No tonsillar, no preauricular, no posterior auricular and no occipital adenopathy present.    He has no cervical adenopathy.       Right: No supraclavicular adenopathy present.       Left: No supraclavicular adenopathy present.  Neurological: He is alert and oriented to person, place, and time. No sensory deficit.  Skin: Skin is warm, dry and intact. No rash noted. No cyanosis or  erythema. Nails show no clubbing.  Psychiatric: He has a normal mood and affect. His speech is normal and behavior is normal. Cognition and memory are impaired.           Assessment & Plan:   1. Cough 2. Wheezing 3. Nasal congestion No fever, no increased SOB. Treat symptomatically. RTC or see PCP if symptoms worsen/persist. - predniSONE (DELTASONE) 20 MG tablet; Take 3 PO QAM x3days, 2 PO QAM x3days, 1 PO QAM x3days  Dispense: 18 tablet; Refill: 0 - benzonatate (TESSALON) 100 MG capsule; Take 1-2 capsules (100-200 mg total) by mouth 3 (three) times daily as needed for cough.  Dispense: 40 capsule; Refill: 0 - Azelastine HCl 0.15 % SOLN; Place 2 sprays into both nostrils 2 (two) times daily.  Dispense: 30 mL; Refill: 0   Fara Chute,  PA-C Physician Assistant-Certified [facility]

## 2016-04-01 NOTE — Patient Instructions (Addendum)
Consider restarting the Symbicort. You need to rinse your mouth or brush your teeth after each use.  Return here or see Dr. Jenny Reichmann if your symptoms worsen or persist.      IF you received an x-ray today, you will receive an invoice from [facility]. Please contact [facility] at [phone] with questions or concerns regarding your invoice.   IF you received labwork today, you will receive an invoice from [facility]. Please contact [facility] at [phone] with questions or concerns regarding your invoice.   Our billing staff will not be able to assist you with questions regarding bills from these companies.  You will be contacted with the lab results as soon as they are available. The fastest way to get your results is to activate your My Chart account. Instructions are located on the last page of this paperwork. If you have not heard from [place] regarding the results in 2 weeks, please contact this office.

## 2016-04-12 ENCOUNTER — Ambulatory Visit (INDEPENDENT_AMBULATORY_CARE_PROVIDER_SITE_OTHER): Payer: PPO | Admitting: Internal Medicine

## 2016-04-12 ENCOUNTER — Encounter: Payer: Self-pay | Admitting: Internal Medicine

## 2016-04-12 ENCOUNTER — Ambulatory Visit (INDEPENDENT_AMBULATORY_CARE_PROVIDER_SITE_OTHER)
Admission: RE | Admit: 2016-04-12 | Discharge: 2016-04-12 | Disposition: A | Discharge status: Home / Self Care | Payer: PPO | Source: Ambulatory Visit | Attending: Internal Medicine | Admitting: Internal Medicine

## 2016-04-12 VITALS — BP 130/70 | HR 81 | Temp 98.6°F | Resp 20 | Wt 206.0 lb

## 2016-04-12 DIAGNOSIS — R05 Cough: Secondary | ICD-10-CM

## 2016-04-12 DIAGNOSIS — J4541 Moderate persistent asthma with (acute) exacerbation: Secondary | ICD-10-CM | POA: Diagnosis not present

## 2016-04-12 DIAGNOSIS — R059 Cough, unspecified: Secondary | ICD-10-CM

## 2016-04-12 DIAGNOSIS — I10 Essential (primary) hypertension: Secondary | ICD-10-CM | POA: Diagnosis not present

## 2016-04-12 MED ORDER — LEVOFLOXACIN 250 MG PO TABS: 250.0000 mg | ORAL_TABLET | Freq: Every day | ORAL | 0 refills | Status: AC

## 2016-04-12 MED ORDER — PREDNISONE 10 MG PO TABS: ORAL_TABLET | ORAL | 0 refills | Status: DC

## 2016-04-12 MED ORDER — HYDROCODONE-HOMATROPINE 5-1.5 MG/5ML PO SYRP: 5.0000 mL | ORAL_SOLUTION | Freq: Four times a day (QID) | ORAL | 0 refills | Status: AC | PRN

## 2016-04-12 MED ORDER — METHYLPREDNISOLONE ACETATE 80 MG/ML IJ SUSP
80.0000 mg | Freq: Once | INTRAMUSCULAR | Status: AC
  Administered 2016-04-12: 80 mg via INTRAMUSCULAR

## 2016-04-12 NOTE — Progress Notes (Signed)
Subjective:    Patient ID: Stephen Wilkins, male    DOB: Mar 18, 1928, 83 y.o.   MRN: [id number]  HPI  Here with acute onset mild to mod 2-3 days ST, HA, general weakness and malaise, with prod cough greenish sputum, but Pt denies chest pain, increased sob or doe, wheezing, orthopnea, PND, increased LE swelling, palpitations, dizziness or syncope, except for mild sob and wheezing since last PM.  Pt denies new neurological symptoms such as new headache, or facial or extremity weakness or numbness   Pt denies polydipsia, polyuria No other new history Past Medical History:  Diagnosis Date  . ABNORMAL ELECTROCARDIOGRAM 06/21/2007  . ALLERGIC RHINITIS 03/23/2007  . Anxiety state, unspecified 09/06/2013  . ASTHMATIC BRONCHITIS, ACUTE 04/27/2007  . BENIGN PROSTATIC HYPERTROPHY 03/23/2007  . BRONCHITIS NOT SPECIFIED AS ACUTE OR CHRONIC 04/21/2007  . BURSITIS, RIGHT HIP 07/31/2008  . Cough 01/29/2009  . Dementia 09/01/2010  . ERECTILE DYSFUNCTION 03/23/2007  . ERECTILE DYSFUNCTION, ORGANIC 04/17/2009  . Esophageal stricture   . FREQUENCY, URINARY 04/26/2010  . GERD 03/23/2007  . HIATAL HERNIA   . HYPERLIPIDEMIA 03/23/2007  . HYPERTENSION 03/23/2007  . MILD COGNITIVE IMPAIRMENT SO STATED 05/19/2010  . NEPHROLITHIASIS, HX OF 03/23/2007  . PSA, INCREASED 06/27/2008  . RASH-NONVESICULAR 03/23/2007  . REACTIVE AIRWAY DISEASE 01/14/2010  . SCHATZKI'S RING   . SCIATICA, RIGHT 04/28/2008  . Unspecified hypothyroidism 09/06/2013  . Wheezing 04/17/2009   Past Surgical History:  Procedure Laterality Date  . TONSILLECTOMY      reports that he has quit smoking. He has never used smokeless tobacco. He reports that he does not drink alcohol or use drugs. family history includes Cancer in his brother; Emphysema in his brother. Allergies  Allergen Reactions  . Atorvastatin     REACTION: myalgia  . Doxazosin Mesylate     REACTION: dizziness   Current Outpatient Prescriptions on File Prior to Visit  Medication  Sig Dispense Refill  . albuterol (PROVENTIL HFA;VENTOLIN HFA) 108 (90 Base) MCG/ACT inhaler Inhale 2 puffs into the lungs every 6 (six) hours as needed for wheezing. 1 Inhaler 11  . ALPRAZolam (XANAX) 0.5 MG tablet Take 1 tablet (0.5 mg total) by mouth daily as needed for anxiety. 30 tablet 2  . aspirin EC 81 MG tablet Take 1 tablet (81 mg total) by mouth daily. 90 tablet 11  . Azelastine HCl 0.15 % SOLN Place 2 sprays into both nostrils 2 (two) times daily. 30 mL 0  . benzonatate (TESSALON) 100 MG capsule Take 1-2 capsules (100-200 mg total) by mouth 3 (three) times daily as needed for cough. 40 capsule 0  . budesonide-formoterol (SYMBICORT) 160-4.5 MCG/ACT inhaler Inhale 2 puffs into the lungs 2 (two) times daily. 1 Inhaler 12  . Cholecalciferol (VITAMIN D) 1000 UNITS capsule Take 1,000 Units by mouth daily.      . cyanocobalamin 1000 MCG tablet Take 100 mcg by mouth daily.    Marland Kitchen desoximetasone (TOPICORT) 0.25 % cream apply sparingly to affected area twice a day as directed if needed 30 g 1  . lansoprazole (PREVACID) 30 MG capsule Take 1 capsule (30 mg total) by mouth daily. 90 capsule 3  . levothyroxine (SYNTHROID, LEVOTHROID) 112 MCG tablet Take 1 tablet (112 mcg total) by mouth daily. 90 tablet 3  . losartan (COZAAR) 50 MG tablet take 1 tablet by mouth once daily 90 tablet 3  . Multiple Vitamins-Minerals (CENTRUM SILVER PO) Take by mouth daily.      . Omega-3 Fatty Acids (  FISH OIL) 1200 MG CAPS Take by mouth daily.      . polyethylene glycol powder (MIRALAX) powder Take 1/2 capful by mouth once daily 850 g 5  . rosuvastatin (CRESTOR) 10 MG tablet Take 1 tablet (10 mg total) by mouth daily. 90 tablet 3  . tamsulosin (FLOMAX) 0.4 MG CAPS capsule take 1 capsule by mouth once daily 90 capsule 3  . vitamin C (ASCORBIC ACID) 500 MG tablet Take 500 mg by mouth daily.     No current facility-administered medications on file prior to visit.    Review of Systems  Constitutional: Negative for unusual  diaphoresis or night sweats HENT: Negative for ear swelling or discharge Eyes: Negative for worsening visual haziness  Respiratory: Negative for choking and stridor.   Gastrointestinal: Negative for distension or worsening eructation Genitourinary: Negative for retention or change in urine volume.  Musculoskeletal: Negative for other MSK pain or swelling Skin: Negative for color change and worsening wound Neurological: Negative for tremors and numbness other than noted  Psychiatric/Behavioral: Negative for decreased concentration or agitation other than above   All other system neg per pt    Objective:   Physical Exam BP 130/70   Pulse 81   Temp 98.6 F (37 C) (Oral)   Resp 20   Wt 206 lb (93.4 kg)   SpO2 95%   BMI 27.94 kg/m  VS noted, mild ill Constitutional: Pt appears in no apparent distress HENT: Head: NCAT.  Right Ear: External ear normal.  Left Ear: External ear normal.  Eyes: . Pupils are equal, round, and reactive to light. Conjunctivae and EOM are normal Bilat tm's with mild erythema.  Max sinus areas non tender.  Pharynx with mild erythema, no exudate Neck: Normal range of motion. Neck supple.  Cardiovascular: Normal rate and regular rhythm.   Pulmonary/Chest: Effort normal and breath sounds without rales but with few mild scattered wheezing.  Neurological: Pt is alert. Not confused , motor grossly intact Skin: Skin is warm. No rash, no LE edema Psychiatric: Pt behavior is normal. No agitation.  No other new exam findings    Assessment & Plan:

## 2016-04-12 NOTE — Progress Notes (Signed)
Pre visit review using our clinic review tool, if applicable. No additional management support is needed unless otherwise documented below in the visit note. 

## 2016-04-12 NOTE — Patient Instructions (Signed)
You had the steroid shot today  Please take all new medication as prescribed - the antibiotic, cough medicine if needed, and the prednisone  Please continue all other medications as before, and refills have been done if requested.  Please have the pharmacy call with any other refills you may need.  Please continue your efforts at being more active, low cholesterol diet, and weight control.  Please keep your appointments with your specialists as you may have planned  Please go to the XRAY Department in the Basement (go straight as you get off the elevator) for the x-ray testing  You will be contacted by phone if any changes need to be made immediately.  Otherwise, you will receive a letter about your results with an explanation, but please check with MyChart first.  Please remember to sign up for MyChart if you have not done so, as this will be important to you in the future with finding out test results, communicating by private email, and scheduling acute appointments online when needed.

## 2016-04-15 NOTE — Assessment & Plan Note (Signed)
Mild to mod, c/w bronchitis vs pna, for cxr, for antibx course,  to f/u any worsening symptoms or concerns 

## 2016-04-15 NOTE — Assessment & Plan Note (Addendum)
stable overall by history and exam, recent data reviewed with pt, and pt to continue medical treatment as before,  to f/u any worsening symptoms or concerns BP Readings from Last 3 Encounters:  04/12/16 130/70  04/01/16 130/72  02/23/16 136/78

## 2016-04-15 NOTE — Assessment & Plan Note (Signed)
Mild to mod, for depomedrol IM 80, predpac asd, to f/u any worsening symptoms or concerns 

## 2016-04-19 ENCOUNTER — Encounter: Payer: Self-pay | Admitting: Internal Medicine

## 2016-04-19 ENCOUNTER — Ambulatory Visit (INDEPENDENT_AMBULATORY_CARE_PROVIDER_SITE_OTHER): Payer: PPO | Admitting: Internal Medicine

## 2016-04-19 VITALS — BP 136/80 | HR 58 | Temp 98.0°F | Resp 20 | Wt 204.0 lb

## 2016-04-19 DIAGNOSIS — R059 Cough, unspecified: Secondary | ICD-10-CM

## 2016-04-19 DIAGNOSIS — R05 Cough: Secondary | ICD-10-CM | POA: Diagnosis not present

## 2016-04-19 DIAGNOSIS — F0390 Unspecified dementia without behavioral disturbance: Secondary | ICD-10-CM | POA: Diagnosis not present

## 2016-04-19 DIAGNOSIS — J449 Chronic obstructive pulmonary disease, unspecified: Secondary | ICD-10-CM | POA: Insufficient documentation

## 2016-04-19 HISTORY — DX: Chronic obstructive pulmonary disease, unspecified: J44.9

## 2016-04-19 MED ORDER — TIOTROPIUM BROMIDE MONOHYDRATE 18 MCG IN CAPS: 18.0000 ug | ORAL_CAPSULE | Freq: Every day | RESPIRATORY_TRACT | 12 refills | Status: DC

## 2016-04-19 NOTE — Patient Instructions (Addendum)
Please take all new medication as prescribed - the spiriva  Please continue all other medications as before, and refills have been done if requested.  Please have the pharmacy call with any other refills you may need.  Please continue your efforts at being more active, low cholesterol diet, and weight control.  Please keep your appointments with your specialists as you may have planned

## 2016-04-19 NOTE — Progress Notes (Signed)
Subjective:    Patient ID: Stephen Wilkins, male    DOB: 1927-08-25, 82 y.o.   MRN: [id number]  HPI  Pt here with wife who is wearing mask though not ill herself.  Pt has persistent non prod cough just not resolving well after recent illness requiring 2 courses of prednisone and other tx.  Pt denies chest pain, increased sob or doe, wheezing, orthopnea, PND, increased LE swelling, palpitations, dizziness or syncope.  Has baseline DOE he wishes would be better.  As before, he admits to not always being compliant with meds.  Denies worsening reflux, abd pain, dysphagia, n/v, bowel change or blood.  No significant worsening allergy symptoms as well.  No other new history.  Nothing seems to make the cough worse or better.  Does state hycodan is too strong, and this is placed on the intolerance list  1/9 cxr c/w NAD and empysema  Dementia overall stable symptomatically, and not assoc with behavioral changes such as hallucinations, paranoia, or agitation.  Past Medical History:  Diagnosis Date  . ABNORMAL ELECTROCARDIOGRAM 06/21/2007  . ALLERGIC RHINITIS 03/23/2007  . Anxiety state, unspecified 09/06/2013  . ASTHMATIC BRONCHITIS, ACUTE 04/27/2007  . BENIGN PROSTATIC HYPERTROPHY 03/23/2007  . BRONCHITIS NOT SPECIFIED AS ACUTE OR CHRONIC 04/21/2007  . BURSITIS, RIGHT HIP 07/31/2008  . Cough 01/29/2009  . Dementia 09/01/2010  . ERECTILE DYSFUNCTION 03/23/2007  . ERECTILE DYSFUNCTION, ORGANIC 04/17/2009  . Esophageal stricture   . FREQUENCY, URINARY 04/26/2010  . GERD 03/23/2007  . HIATAL HERNIA   . HYPERLIPIDEMIA 03/23/2007  . HYPERTENSION 03/23/2007  . MILD COGNITIVE IMPAIRMENT SO STATED 05/19/2010  . NEPHROLITHIASIS, HX OF 03/23/2007  . PSA, INCREASED 06/27/2008  . RASH-NONVESICULAR 03/23/2007  . REACTIVE AIRWAY DISEASE 01/14/2010  . SCHATZKI'S RING   . SCIATICA, RIGHT 04/28/2008  . Unspecified hypothyroidism 09/06/2013  . Wheezing 04/17/2009   Past Surgical History:  Procedure Laterality Date  .  TONSILLECTOMY      reports that he has quit smoking. He has never used smokeless tobacco. He reports that he does not drink alcohol or use drugs. family history includes Cancer in his brother; Emphysema in his brother. Allergies  Allergen Reactions  . Atorvastatin     REACTION: myalgia  . Doxazosin Mesylate     REACTION: dizziness  . Hydrocodone-Homatropine Other (See Comments)    anxiety   Current Outpatient Prescriptions on File Prior to Visit  Medication Sig Dispense Refill  . albuterol (PROVENTIL HFA;VENTOLIN HFA) 108 (90 Base) MCG/ACT inhaler Inhale 2 puffs into the lungs every 6 (six) hours as needed for wheezing. 1 Inhaler 11  . ALPRAZolam (XANAX) 0.5 MG tablet Take 1 tablet (0.5 mg total) by mouth daily as needed for anxiety. 30 tablet 2  . aspirin EC 81 MG tablet Take 1 tablet (81 mg total) by mouth daily. 90 tablet 11  . Azelastine HCl 0.15 % SOLN Place 2 sprays into both nostrils 2 (two) times daily. 30 mL 0  . budesonide-formoterol (SYMBICORT) 160-4.5 MCG/ACT inhaler Inhale 2 puffs into the lungs 2 (two) times daily. 1 Inhaler 12  . Cholecalciferol (VITAMIN D) 1000 UNITS capsule Take 1,000 Units by mouth daily.      . cyanocobalamin 1000 MCG tablet Take 100 mcg by mouth daily.    Marland Kitchen desoximetasone (TOPICORT) 0.25 % cream apply sparingly to affected area twice a day as directed if needed 30 g 1  . lansoprazole (PREVACID) 30 MG capsule Take 1 capsule (30 mg total) by mouth daily. 90 capsule  3  . levothyroxine (SYNTHROID, LEVOTHROID) 112 MCG tablet Take 1 tablet (112 mcg total) by mouth daily. 90 tablet 3  . losartan (COZAAR) 50 MG tablet take 1 tablet by mouth once daily 90 tablet 3  . Multiple Vitamins-Minerals (CENTRUM SILVER PO) Take by mouth daily.      . Omega-3 Fatty Acids (FISH OIL) 1200 MG CAPS Take by mouth daily.      . polyethylene glycol powder (MIRALAX) powder Take 1/2 capful by mouth once daily 850 g 5  . predniSONE (DELTASONE) 10 MG tablet 3 tabs by mouth per day  for 3 days,2tabs per day for 3 days,1tab per day for 3 days 18 tablet 0  . rosuvastatin (CRESTOR) 10 MG tablet Take 1 tablet (10 mg total) by mouth daily. 90 tablet 3  . tamsulosin (FLOMAX) 0.4 MG CAPS capsule take 1 capsule by mouth once daily 90 capsule 3  . vitamin C (ASCORBIC ACID) 500 MG tablet Take 500 mg by mouth daily.    . benzonatate (TESSALON) 100 MG capsule Take 1-2 capsules (100-200 mg total) by mouth 3 (three) times daily as needed for cough. (Patient not taking: Reported on 04/19/2016) 40 capsule 0  . HYDROcodone-homatropine (HYCODAN) 5-1.5 MG/5ML syrup Take 5 mLs by mouth every 6 (six) hours as needed for cough. (Patient not taking: Reported on 04/19/2016) 180 mL 0  . levofloxacin (LEVAQUIN) 250 MG tablet Take 1 tablet (250 mg total) by mouth daily. (Patient not taking: Reported on 04/19/2016) 10 tablet 0   No current facility-administered medications on file prior to visit.     Review of Systems  Constitutional: Negative for unusual diaphoresis or night sweats HENT: Negative for ear swelling or discharge Eyes: Negative for worsening visual haziness  Respiratory: Negative for choking and stridor.   Gastrointestinal: Negative for distension or worsening eructation Genitourinary: Negative for retention or change in urine volume.  Musculoskeletal: Negative for other MSK pain or swelling Skin: Negative for color change and worsening wound Neurological: Negative for tremors and numbness other than noted  Psychiatric/Behavioral: Negative for decreased concentration or agitation other than above   All other system neg per pt    Objective:   Physical Exam BP 136/80   Pulse (!) 58   Temp 98 F (36.7 C) (Oral)   Resp 20   Wt 204 lb (92.5 kg)   SpO2 92%   BMI 27.67 kg/m  VS noted, not il appearing Constitutional: Pt appears in no apparent distress HENT: Head: NCAT.  Right Ear: External ear normal.  Left Ear: External ear normal.  Eyes: . Pupils are equal, round, and reactive  to light. Conjunctivae and EOM are normal Neck: Normal range of motion. Neck supple.  Cardiovascular: Normal rate and regular rhythm.   Pulmonary/Chest: Effort normal and breath sounds decreased without rales or wheezing.  Abd:  Soft, NT, ND, + BS Neurological: Pt is alert. At baseline confused , motor grossly intact Skin: Skin is warm. No rash, no LE edema Psychiatric: Pt behavior is normal. No agitation.     Assessment & Plan:

## 2016-04-19 NOTE — Assessment & Plan Note (Signed)
stable overall by history and exam, recent data reviewed with pt, and pt to continue medical treatment as before,  to f/u any worsening symptoms or concerns  Lab Results  Component Value Date   WBC 8.3 07/29/2015   HGB 13.6 07/29/2015   HCT 39.9 07/29/2015   PLT 249.0 07/29/2015   GLUCOSE 98 02/15/2016   CHOL 180 02/15/2016   TRIG 102.0 02/15/2016   HDL 46.50 02/15/2016   LDLDIRECT 142.0 07/29/2015   LDLCALC 113 (H) 02/15/2016   ALT 11 02/15/2016   AST 18 02/15/2016   NA 144 02/15/2016   K 4.5 02/15/2016   CL 105 02/15/2016   CREATININE 1.50 02/15/2016   BUN 25 (H) 02/15/2016   CO2 31 02/15/2016   TSH 9.12 (H) 02/15/2016   PSA 3.94 03/12/2014

## 2016-04-19 NOTE — Assessment & Plan Note (Signed)
With mild persistent doe, urged compliance with current meds, also for trial spiriva daily,  to f/u any worsening symptoms or concerns

## 2016-04-19 NOTE — Assessment & Plan Note (Signed)
No evidence for active illness at this time, for tess perle prn,  to f/u any worsening symptoms or concerns

## 2016-04-19 NOTE — Progress Notes (Signed)
Pre visit review using our clinic review tool, if applicable. No additional management support is needed unless otherwise documented below in the visit note. 

## 2016-07-21 ENCOUNTER — Encounter: Payer: Self-pay | Admitting: Internal Medicine

## 2016-07-21 ENCOUNTER — Ambulatory Visit (INDEPENDENT_AMBULATORY_CARE_PROVIDER_SITE_OTHER)
Admission: RE | Admit: 2016-07-21 | Discharge: 2016-07-21 | Disposition: A | Discharge status: Home / Self Care | Payer: PPO | Source: Ambulatory Visit | Attending: Internal Medicine | Admitting: Internal Medicine

## 2016-07-21 ENCOUNTER — Ambulatory Visit (INDEPENDENT_AMBULATORY_CARE_PROVIDER_SITE_OTHER): Payer: PPO | Admitting: Internal Medicine

## 2016-07-21 ENCOUNTER — Other Ambulatory Visit (INDEPENDENT_AMBULATORY_CARE_PROVIDER_SITE_OTHER): Payer: PPO

## 2016-07-21 VITALS — BP 118/78 | HR 79 | Ht 72.0 in | Wt 199.0 lb

## 2016-07-21 DIAGNOSIS — R739 Hyperglycemia, unspecified: Secondary | ICD-10-CM

## 2016-07-21 DIAGNOSIS — R05 Cough: Secondary | ICD-10-CM | POA: Diagnosis not present

## 2016-07-21 DIAGNOSIS — E785 Hyperlipidemia, unspecified: Secondary | ICD-10-CM

## 2016-07-21 DIAGNOSIS — R059 Cough, unspecified: Secondary | ICD-10-CM

## 2016-07-21 DIAGNOSIS — J4541 Moderate persistent asthma with (acute) exacerbation: Secondary | ICD-10-CM

## 2016-07-21 DIAGNOSIS — E890 Postprocedural hypothyroidism: Secondary | ICD-10-CM | POA: Diagnosis not present

## 2016-07-21 DIAGNOSIS — R918 Other nonspecific abnormal finding of lung field: Secondary | ICD-10-CM | POA: Diagnosis not present

## 2016-07-21 LAB — HEPATIC FUNCTION PANEL
ALBUMIN: 4.2 g/dL (ref 3.5–5.2)
ALK PHOS: 62 U/L (ref 39–117)
ALT: 10 U/L (ref 0–53)
AST: 17 U/L (ref 0–37)
Bilirubin, Direct: 0.1 mg/dL (ref 0.0–0.3)
Total Bilirubin: 0.8 mg/dL (ref 0.2–1.2)
Total Protein: 7.1 g/dL (ref 6.0–8.3)

## 2016-07-21 LAB — URINALYSIS, ROUTINE W REFLEX MICROSCOPIC
Bilirubin Urine: NEGATIVE
HGB URINE DIPSTICK: NEGATIVE
Ketones, ur: NEGATIVE
Nitrite: NEGATIVE
PH: 6.5 (ref 5.0–8.0)
RBC / HPF: NONE SEEN (ref 0–?)
Specific Gravity, Urine: 1.01 (ref 1.000–1.030)
TOTAL PROTEIN, URINE-UPE24: NEGATIVE
UROBILINOGEN UA: 1 (ref 0.0–1.0)
Urine Glucose: NEGATIVE

## 2016-07-21 LAB — CBC WITH DIFFERENTIAL/PLATELET
BASOS ABS: 0.1 10*3/uL (ref 0.0–0.1)
Basophils Relative: 0.8 % (ref 0.0–3.0)
EOS ABS: 0.5 10*3/uL (ref 0.0–0.7)
Eosinophils Relative: 6.1 % — ABNORMAL HIGH (ref 0.0–5.0)
HCT: 40.8 % (ref 39.0–52.0)
Hemoglobin: 13.6 g/dL (ref 13.0–17.0)
LYMPHS ABS: 2.2 10*3/uL (ref 0.7–4.0)
Lymphocytes Relative: 27.3 % (ref 12.0–46.0)
MCHC: 33.3 g/dL (ref 30.0–36.0)
MCV: 93.8 fl (ref 78.0–100.0)
MONO ABS: 0.6 10*3/uL (ref 0.1–1.0)
MONOS PCT: 7.6 % (ref 3.0–12.0)
NEUTROS PCT: 58.2 % (ref 43.0–77.0)
Neutro Abs: 4.8 10*3/uL (ref 1.4–7.7)
Platelets: 270 10*3/uL (ref 150.0–400.0)
RBC: 4.35 Mil/uL (ref 4.22–5.81)
RDW: 12.8 % (ref 11.5–15.5)
WBC: 8.2 10*3/uL (ref 4.0–10.5)

## 2016-07-21 LAB — BASIC METABOLIC PANEL
BUN: 24 mg/dL — ABNORMAL HIGH (ref 6–23)
CALCIUM: 9.8 mg/dL (ref 8.4–10.5)
CO2: 31 mEq/L (ref 19–32)
Chloride: 103 mEq/L (ref 96–112)
Creatinine, Ser: 1.57 mg/dL — ABNORMAL HIGH (ref 0.40–1.50)
GFR: 44.7 mL/min — ABNORMAL LOW (ref >60.00)
Glucose, Bld: 106 mg/dL — ABNORMAL HIGH (ref 70–99)
Potassium: 4.9 mEq/L (ref 3.5–5.1)
SODIUM: 140 meq/L (ref 135–145)

## 2016-07-21 LAB — LIPID PANEL
Cholesterol: 201 mg/dL — ABNORMAL HIGH (ref 0–200)
HDL: 51.3 mg/dL (ref >39.00)
LDL Cholesterol: 119 mg/dL — ABNORMAL HIGH (ref 0–99)
NonHDL: 149.2
Total CHOL/HDL Ratio: 4
Triglycerides: 152 mg/dL — ABNORMAL HIGH (ref 0.0–149.0)
VLDL: 30.4 mg/dL (ref 0.0–40.0)

## 2016-07-21 LAB — T4, FREE: Free T4: 0.78 ng/dL (ref 0.60–1.60)

## 2016-07-21 LAB — HEMOGLOBIN A1C: Hgb A1c MFr Bld: 6.1 % (ref 4.6–6.5)

## 2016-07-21 LAB — TSH: TSH: 4.4 u[IU]/mL (ref 0.35–4.50)

## 2016-07-21 MED ORDER — PREDNISONE 10 MG PO TABS: ORAL_TABLET | ORAL | 0 refills | Status: DC

## 2016-07-21 MED ORDER — BECLOMETHASONE DIPROP HFA 80 MCG/ACT IN AERB: 1.0000 | INHALATION_SPRAY | Freq: Two times a day (BID) | RESPIRATORY_TRACT | 11 refills | Status: DC

## 2016-07-21 MED ORDER — AMOXICILLIN-POT CLAVULANATE 875-125 MG PO TABS: 1.0000 | ORAL_TABLET | Freq: Two times a day (BID) | ORAL | 0 refills | Status: DC

## 2016-07-21 MED ORDER — ALBUTEROL SULFATE HFA 108 (90 BASE) MCG/ACT IN AERS: 2.0000 | INHALATION_SPRAY | Freq: Four times a day (QID) | RESPIRATORY_TRACT | 11 refills | Status: DC | PRN

## 2016-07-21 MED ORDER — HYDROCODONE-HOMATROPINE 5-1.5 MG/5ML PO SYRP: 5.0000 mL | ORAL_SOLUTION | Freq: Four times a day (QID) | ORAL | 0 refills | Status: DC | PRN

## 2016-07-21 MED ORDER — METHYLPREDNISOLONE ACETATE 80 MG/ML IJ SUSP
80.0000 mg | Freq: Once | INTRAMUSCULAR | Status: AC
Start: 2016-07-21 — End: 2016-07-21
  Administered 2016-07-21: 80 mg via INTRAMUSCULAR

## 2016-07-21 NOTE — Progress Notes (Signed)
Subjective:    Patient ID: Stephen Wilkins, male    DOB: 01-01-1928, 83 y.o.   MRN: [id number]  HPI  Here to f/u with wife who helps with hx as pt with dementia; Here with acute onset mild to mod 4 days ST, HA, general weakness and malaise, with prod cough greenish sputum assoc with wheezing and sob, but Pt denies chest pain,  orthopnea, PND, increased LE swelling, palpitations, dizziness or syncope.  Denies hyper or hypo thyroid symptoms such as voice, skin or hair change, though admits to not taking his thyroid med for some reason for the last wk.  Has been trying to follow a lower chol diet, and o/w compliant with meds, asks for lipid check.  Now thinks symbicort may cause nightmares, so has not been using, asks for qvar restart  , Pt denies polydipsia, polyuria  Past Medical History:  Diagnosis Date  . ABNORMAL ELECTROCARDIOGRAM 06/21/2007  . ALLERGIC RHINITIS 03/23/2007  . Anxiety state, unspecified 09/06/2013  . ASTHMATIC BRONCHITIS, ACUTE 04/27/2007  . BENIGN PROSTATIC HYPERTROPHY 03/23/2007  . BRONCHITIS NOT SPECIFIED AS ACUTE OR CHRONIC 04/21/2007  . BURSITIS, RIGHT HIP 07/31/2008  . COPD (chronic obstructive pulmonary disease) ([place]) 04/19/2016  . Cough 01/29/2009  . Dementia 09/01/2010  . ERECTILE DYSFUNCTION 03/23/2007  . ERECTILE DYSFUNCTION, ORGANIC 04/17/2009  . Esophageal stricture   . FREQUENCY, URINARY 04/26/2010  . GERD 03/23/2007  . HIATAL HERNIA   . HYPERLIPIDEMIA 03/23/2007  . HYPERTENSION 03/23/2007  . MILD COGNITIVE IMPAIRMENT SO STATED 05/19/2010  . NEPHROLITHIASIS, HX OF 03/23/2007  . PSA, INCREASED 06/27/2008  . RASH-NONVESICULAR 03/23/2007  . REACTIVE AIRWAY DISEASE 01/14/2010  . SCHATZKI'S RING   . SCIATICA, RIGHT 04/28/2008  . Unspecified hypothyroidism 09/06/2013  . Wheezing 04/17/2009   Past Surgical History:  Procedure Laterality Date  . TONSILLECTOMY      reports that he has quit smoking. He has never used smokeless tobacco. He reports that he does not drink  alcohol or use drugs. family history includes Cancer in his brother; Emphysema in his brother. Allergies  Allergen Reactions  . Atorvastatin     REACTION: myalgia  . Doxazosin Mesylate     REACTION: dizziness  . Hydrocodone-Homatropine Other (See Comments)    anxiety   Current Outpatient Prescriptions on File Prior to Visit  Medication Sig Dispense Refill  . ALPRAZolam (XANAX) 0.5 MG tablet Take 1 tablet (0.5 mg total) by mouth daily as needed for anxiety. 30 tablet 2  . aspirin EC 81 MG tablet Take 1 tablet (81 mg total) by mouth daily. 90 tablet 11  . Azelastine HCl 0.15 % SOLN Place 2 sprays into both nostrils 2 (two) times daily. 30 mL 0  . Cholecalciferol (VITAMIN D) 1000 UNITS capsule Take 1,000 Units by mouth daily.      . cyanocobalamin 1000 MCG tablet Take 100 mcg by mouth daily.    Marland Kitchen desoximetasone (TOPICORT) 0.25 % cream apply sparingly to affected area twice a day as directed if needed 30 g 1  . lansoprazole (PREVACID) 30 MG capsule Take 1 capsule (30 mg total) by mouth daily. 90 capsule 3  . levothyroxine (SYNTHROID, LEVOTHROID) 112 MCG tablet Take 1 tablet (112 mcg total) by mouth daily. 90 tablet 3  . losartan (COZAAR) 50 MG tablet take 1 tablet by mouth once daily 90 tablet 3  . Multiple Vitamins-Minerals (CENTRUM SILVER PO) Take by mouth daily.      . Omega-3 Fatty Acids (FISH OIL) 1200 MG CAPS Take  by mouth daily.      . polyethylene glycol powder (MIRALAX) powder Take 1/2 capful by mouth once daily 850 g 5  . rosuvastatin (CRESTOR) 10 MG tablet Take 1 tablet (10 mg total) by mouth daily. 90 tablet 3  . tamsulosin (FLOMAX) 0.4 MG CAPS capsule take 1 capsule by mouth once daily 90 capsule 3  . vitamin C (ASCORBIC ACID) 500 MG tablet Take 500 mg by mouth daily.     No current facility-administered medications on file prior to visit.    Review of Systems  Constitutional: Negative for other unusual diaphoresis or sweats HENT: Negative for ear discharge or  swelling Eyes: Negative for other worsening visual disturbances Respiratory: Negative for stridor or other swelling  Gastrointestinal: Negative for worsening distension or other blood Genitourinary: Negative for retention or other urinary change Musculoskeletal: Negative for other MSK pain or swelling Skin: Negative for color change or other new lesions Neurological: Negative for worsening tremors and other numbness  Psychiatric/Behavioral: Negative for worsening agitation or other fatigue All other system neg per pt    Objective:   Physical Exam BP 118/78   Pulse 79   Ht 6' (1.829 m)   Wt 199 lb (90.3 kg)   SpO2 99%   BMI 26.99 kg/m  VS noted, mild ill, no accessory muscle use Constitutional: Pt appears in NAD HENT: Head: NCAT.  Right Ear: External ear normal.  Left Ear: External ear normal.  Eyes: . Pupils are equal, round, and reactive to light. Conjunctivae and EOM are normal Nose: without d/c or deformity Neck: Neck supple. Gross normal ROM Cardiovascular: Normal rate and regular rhythm.   Pulmonary/Chest: Effort normal and breath sounds decreased without rales but with mild bilat diffuse wheezing.  Neurological: Pt is alert. At baseline orientation, motor grossly intact Skin: Skin is warm. No rashes, other new lesions, no LE edema Psychiatric: Pt behavior is normal without agitation  No other exam findings    Assessment & Plan:

## 2016-07-21 NOTE — Progress Notes (Signed)
Pre visit review using our clinic review tool, if applicable. No additional management support is needed unless otherwise documented below in the visit note. 

## 2016-07-21 NOTE — Patient Instructions (Addendum)
You had the steroid shot today  Please take all new medication as prescribed - the antibiotic, cough medicine if needed, and the prednisone  OK to stop the symbicort, and restart the Qvar  Please continue all other medications as before, and refills have been done if requested - the Ventolin inhaler  Please have the pharmacy call with any other refills you may need.  Please continue your efforts at being more active, low cholesterol diet, and weight control.  You are otherwise up to date with prevention measures today.  Please keep your appointments with your specialists as you may have planned  Please go to the XRAY Department in the Basement (go straight as you get off the elevator) for the x-ray testing  Please go to the LAB in the Basement (turn left off the elevator) for the tests to be done today  You will be contacted by phone if any changes need to be made immediately.  Otherwise, you will receive a letter about your results with an explanation, but please check with MyChart first.  Please remember to sign up for MyChart if you have not done so, as this will be important to you in the future with finding out test results, communicating by private email, and scheduling acute appointments online when needed.  Please return in 1 months, or sooner if needed, for your yearly physical

## 2016-07-22 ENCOUNTER — Telehealth: Payer: Self-pay

## 2016-07-22 NOTE — Telephone Encounter (Signed)
-----   Message from Biagio Borg, MD sent at 07/21/2016  9:32 PM EDT ----- Left message on MyChart, pt to cont same tx except  The test results show that your current treatment is OK, except the Chest xray does show evidence for probable mild pneumonia.  Please continue the treatment as discussed at your office visit.    Stephen Wilkins to please inform pt's Wife, as pt has dementia

## 2016-07-22 NOTE — Telephone Encounter (Signed)
Informed pt's wife of his results, she expressed understanding.

## 2016-07-24 NOTE — Assessment & Plan Note (Addendum)
Mild to mod, for cxr - r/on pna, for antibx course, cough med prn,  to f/u any worsening symptoms or concerns

## 2016-07-24 NOTE — Assessment & Plan Note (Signed)
asympt, stable overall by history and exam, recent data reviewed with pt, and pt to continue medical treatment as before,  to f/u any worsening symptoms or concerns Lab Results  Component Value Date   HGBA1C 6.1 07/21/2016

## 2016-07-24 NOTE — Assessment & Plan Note (Signed)
Mild to mod, for depomedrol IM 80, predpac asd, to f/u any worsening symptoms or concerns 

## 2016-07-24 NOTE — Assessment & Plan Note (Signed)
stable overall by history and exam, recent data reviewed, and pt to continue medical treatment as before,  to f/u any worsening symptoms or concerns Lab Results  Component Value Date   LDLCALC 119 (H) 07/21/2016

## 2016-07-24 NOTE — Assessment & Plan Note (Signed)
Lab Results  Component Value Date   TSH 4.40 07/21/2016  stable, encouraged compliance with meds

## 2016-07-30 ENCOUNTER — Encounter: Payer: Self-pay | Admitting: Family Medicine

## 2016-07-30 ENCOUNTER — Ambulatory Visit (INDEPENDENT_AMBULATORY_CARE_PROVIDER_SITE_OTHER): Payer: PPO | Admitting: Family Medicine

## 2016-07-30 DIAGNOSIS — J4541 Moderate persistent asthma with (acute) exacerbation: Secondary | ICD-10-CM | POA: Diagnosis not present

## 2016-07-30 DIAGNOSIS — I10 Essential (primary) hypertension: Secondary | ICD-10-CM | POA: Diagnosis not present

## 2016-07-30 MED ORDER — BENZONATATE 100 MG PO CAPS: 100.0000 mg | ORAL_CAPSULE | Freq: Two times a day (BID) | ORAL | 0 refills | Status: DC | PRN

## 2016-07-30 MED ORDER — METHYLPREDNISOLONE 4 MG PO TABS: ORAL_TABLET | ORAL | 0 refills | Status: DC

## 2016-07-30 NOTE — Assessment & Plan Note (Signed)
Well controlled, no changes to meds. Encouraged heart healthy diet such as the DASH diet and exercise as tolerated.  °

## 2016-07-30 NOTE — Patient Instructions (Addendum)
Zyrtec/Cetirizine 10 mg daily will replace the Claritine Add the Flonase to help the allergies  Use Albuterol a couple times a day with cough, sob, wheezing Elderberry liquid, probiotics such as [employer] multistrain probiotic daily Acute Bronchitis, Adult Acute bronchitis is when air tubes (bronchi) in the lungs suddenly get swollen. The condition can make it hard to breathe. It can also cause these symptoms:  A cough.  Coughing up clear, yellow, or green mucus.  Wheezing.  Chest congestion.  Shortness of breath.  A fever.  Body aches.  Chills.  A sore throat. Follow these instructions at home: Medicines   Take over-the-counter and prescription medicines only as told by your doctor.  If you were prescribed an antibiotic medicine, take it as told by your doctor. Do not stop taking the antibiotic even if you start to feel better. General instructions   Rest.  Drink enough fluids to keep your pee (urine) clear or pale yellow.  Avoid smoking and secondhand smoke. If you smoke and you need help quitting, ask your doctor. Quitting will help your lungs heal faster.  Use an inhaler, cool mist vaporizer, or humidifier as told by your doctor.  Keep all follow-up visits as told by your doctor. This is important. How is this prevented? To lower your risk of getting this condition again:  Wash your hands often with soap and water. If you cannot use soap and water, use hand sanitizer.  Avoid contact with people who have cold symptoms.  Try not to touch your hands to your mouth, nose, or eyes.  Make sure to get the flu shot every year. Contact a doctor if:  Your symptoms do not get better in 2 weeks. Get help right away if:  You cough up blood.  You have chest pain.  You have very bad shortness of breath.  You become dehydrated.  You faint (pass out) or keep feeling like you are going to pass out.  You keep throwing up (vomiting).  You have a very bad  headache.  Your fever or chills gets worse. This information is not intended to replace advice given to you by your health care provider. Make sure you discuss any questions you have with your health care provider. Document Released: 09/07/2007 Document Revised: 10/28/2015 Document Reviewed: 09/09/2015 Elsevier Interactive Patient Education  2017 [employer].

## 2016-07-31 NOTE — Progress Notes (Signed)
Subjective:    Patient ID: Stephen Wilkins, male    DOB: [birth year], 83 y.o.   MRN: [id number]  Chief Complaint  Patient presents with  . Cough    still coughing one more dose of meds..pt reports he feels worse that 07/21/16 OV...wheezing is a new Sx has had relief with inhaler...non productive cough....pt has been taking Delsym    HPI Patient is in today for reevaluation of persistent cough. Recently treated with steroid shot, antibiotics and oral steroids. He reports his chills have resolved but he continues to struggle with wheezing, cough and sob. No fevers, chills or other new symptoms. Denies palp/HA/fevers/GI or GU c/o. Taking meds as prescribed. Notes some intermittent chest discomfort but only with coughing.   Past Medical History:  Diagnosis Date  . ABNORMAL ELECTROCARDIOGRAM 06/21/2007  . ALLERGIC RHINITIS 03/23/2007  . Anxiety state, unspecified 09/06/2013  . ASTHMATIC BRONCHITIS, ACUTE 04/27/2007  . BENIGN PROSTATIC HYPERTROPHY 03/23/2007  . BRONCHITIS NOT SPECIFIED AS ACUTE OR CHRONIC 04/21/2007  . BURSITIS, RIGHT HIP 07/31/2008  . COPD (chronic obstructive pulmonary disease) ([place]) 04/19/2016  . Cough 01/29/2009  . Dementia 09/01/2010  . ERECTILE DYSFUNCTION 03/23/2007  . ERECTILE DYSFUNCTION, ORGANIC 04/17/2009  . Esophageal stricture   . FREQUENCY, URINARY 04/26/2010  . GERD 03/23/2007  . HIATAL HERNIA   . HYPERLIPIDEMIA 03/23/2007  . HYPERTENSION 03/23/2007  . MILD COGNITIVE IMPAIRMENT SO STATED 05/19/2010  . NEPHROLITHIASIS, HX OF 03/23/2007  . PSA, INCREASED 06/27/2008  . RASH-NONVESICULAR 03/23/2007  . REACTIVE AIRWAY DISEASE 01/14/2010  . SCHATZKI'S RING   . SCIATICA, RIGHT 04/28/2008  . Unspecified hypothyroidism 09/06/2013  . Wheezing 04/17/2009    Past Surgical History:  Procedure Laterality Date  . TONSILLECTOMY      Family History  Problem Relation Age of Onset  . Cancer Brother   . Cancer      lung cancer  . Hypertension    . Emphysema Brother      Social History   Social History  . Marital status: Married    Spouse name: N/A  . Number of children: N/A  . Years of education: N/A   Occupational History  . retired [employer] for 20 yrs. 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 Smoking status: Former Research scientist (life sciences)  . Smokeless tobacco: Never Used  . Alcohol use No  . Drug use: No  . Sexual activity: Not on file   Other Topics Concern  . Not on file   Social History Narrative  . No narrative on file    Outpatient Medications Prior to Visit  Medication Sig Dispense Refill  . albuterol (PROVENTIL HFA;VENTOLIN HFA) 108 (90 Base) MCG/ACT inhaler Inhale 2 puffs into the lungs every 6 (six) hours as needed for wheezing. 1 Inhaler 11  . ALPRAZolam (XANAX) 0.5 MG tablet Take 1 tablet (0.5 mg total) by mouth daily as needed for anxiety. 30 tablet 2  . aspirin EC 81 MG tablet Take 1 tablet (81 mg total) by mouth daily. 90 tablet 11  . Azelastine HCl 0.15 % SOLN Place 2 sprays into both nostrils 2 (two) times daily. 30 mL 0  . Beclomethasone Diprop HFA (QVAR REDIHALER) 80 MCG/ACT AERB Inhale 1 puff into the lungs 2 (two) times daily. 1 Inhaler 11  . Cholecalciferol (VITAMIN D) 1000 UNITS capsule Take 1,000 Units by mouth daily.      . cyanocobalamin 1000 MCG tablet Take 100 mcg by mouth daily.    Marland Kitchen  desoximetasone (TOPICORT) 0.25 % cream apply sparingly to affected area twice a day as directed if needed 30 g 1  . lansoprazole (PREVACID) 30 MG capsule Take 1 capsule (30 mg total) by mouth daily. 90 capsule 3  . levothyroxine (SYNTHROID, LEVOTHROID) 112 MCG tablet Take 1 tablet (112 mcg total) by mouth daily. 90 tablet 3  . losartan (COZAAR) 50 MG tablet take 1 tablet by mouth once daily 90 tablet 3  . Multiple Vitamins-Minerals (CENTRUM SILVER PO) Take by mouth daily.      . polyethylene glycol powder (MIRALAX) powder Take 1/2 capful by mouth once daily 850 g 5  . rosuvastatin (CRESTOR) 10 MG tablet Take 1  tablet (10 mg total) by mouth daily. 90 tablet 3  . tamsulosin (FLOMAX) 0.4 MG CAPS capsule take 1 capsule by mouth once daily 90 capsule 3  . vitamin C (ASCORBIC ACID) 500 MG tablet Take 500 mg by mouth daily.    Marland Kitchen amoxicillin-clavulanate (AUGMENTIN) 875-125 MG tablet Take 1 tablet by mouth 2 (two) times daily. 20 tablet 0  . HYDROcodone-homatropine (HYCODAN) 5-1.5 MG/5ML syrup Take 5 mLs by mouth every 6 (six) hours as needed for cough. 180 mL 0  . Omega-3 Fatty Acids (FISH OIL) 1200 MG CAPS Take by mouth daily.      . predniSONE (DELTASONE) 10 MG tablet 3 tabs by mouth per day for 3 days, 2tabs per day for 3 days,2tab per day for 3 days 18 tablet 0   No facility-administered medications prior to visit.     Allergies  Allergen Reactions  . Atorvastatin     REACTION: myalgia  . Doxazosin Mesylate     REACTION: dizziness  . Hydrocodone-Homatropine Other (See Comments)    anxiety    Review of Systems  Constitutional: Positive for malaise/fatigue. Negative for fever.  HENT: Negative for congestion.   Eyes: Negative for blurred vision.  Respiratory: Positive for cough, shortness of breath and wheezing. Negative for sputum production.   Cardiovascular: Negative for chest pain, palpitations and leg swelling.  Gastrointestinal: Negative for abdominal pain, blood in stool and nausea.  Genitourinary: Negative for dysuria and frequency.  Musculoskeletal: Positive for myalgias. Negative for falls.  Skin: Negative for rash.  Neurological: Negative for dizziness, loss of consciousness and headaches.  Endo/Heme/Allergies: Negative for environmental allergies.  Psychiatric/Behavioral: Negative for depression. The patient is not nervous/anxious.        Objective:    Physical Exam  Constitutional: He is oriented to person, place, and time. He appears well-developed and well-nourished. No distress.  HENT:  Head: Normocephalic and atraumatic.  Eyes: Conjunctivae are normal.  Neck: Neck  supple. No thyromegaly present.  Cardiovascular: Normal rate, regular rhythm and normal heart sounds.   No murmur heard. Pulmonary/Chest: Effort normal. No respiratory distress. He has wheezes. He has no rales.  Abdominal: Soft. Bowel sounds are normal. He exhibits no mass. There is no tenderness.  Musculoskeletal: He exhibits no edema.  Lymphadenopathy:    He has no cervical adenopathy.  Neurological: He is alert and oriented to person, place, and time.  Skin: Skin is warm and dry.  Psychiatric: He has a normal mood and affect. His behavior is normal.    BP 124/78   Pulse 84   Temp 97.9 F (36.6 C) (Oral)   Wt 210 lb (95.3 kg)   SpO2 95%   BMI 28.48 kg/m  Wt Readings from Last 3 Encounters:  07/30/16 210 lb (95.3 kg)  07/21/16 199 lb (90.3 kg)  04/19/16 204 lb (92.5 kg)     Lab Results  Component Value Date   WBC 8.2 07/21/2016   HGB 13.6 07/21/2016   HCT 40.8 07/21/2016   PLT 270.0 07/21/2016   GLUCOSE 106 (H) 07/21/2016   CHOL 201 (H) 07/21/2016   TRIG 152.0 (H) 07/21/2016   HDL 51.30 07/21/2016   LDLDIRECT 142.0 07/29/2015   LDLCALC 119 (H) 07/21/2016   ALT 10 07/21/2016   AST 17 07/21/2016   NA 140 07/21/2016   K 4.9 07/21/2016   CL 103 07/21/2016   CREATININE 1.57 (H) 07/21/2016   BUN 24 (H) 07/21/2016   CO2 31 07/21/2016   TSH 4.40 07/21/2016   PSA 3.94 03/12/2014   HGBA1C 6.1 07/21/2016    Lab Results  Component Value Date   TSH 4.40 07/21/2016   Lab Results  Component Value Date   WBC 8.2 07/21/2016   HGB 13.6 07/21/2016   HCT 40.8 07/21/2016   MCV 93.8 07/21/2016   PLT 270.0 07/21/2016   Lab Results  Component Value Date   NA 140 07/21/2016   K 4.9 07/21/2016   CO2 31 07/21/2016   GLUCOSE 106 (H) 07/21/2016   BUN 24 (H) 07/21/2016   CREATININE 1.57 (H) 07/21/2016   BILITOT 0.8 07/21/2016   ALKPHOS 62 07/21/2016   AST 17 07/21/2016   ALT 10 07/21/2016   PROT 7.1 07/21/2016   ALBUMIN 4.2 07/21/2016   CALCIUM 9.8 07/21/2016   GFR  44.70 (L) 07/21/2016   Lab Results  Component Value Date   CHOL 201 (H) 07/21/2016   Lab Results  Component Value Date   HDL 51.30 07/21/2016   Lab Results  Component Value Date   LDLCALC 119 (H) 07/21/2016   Lab Results  Component Value Date   TRIG 152.0 (H) 07/21/2016   Lab Results  Component Value Date   CHOLHDL 4 07/21/2016   Lab Results  Component Value Date   HGBA1C 6.1 07/21/2016       Assessment & Plan:   Problem List Items Addressed This Visit    Essential hypertension    Well controlled, no changes to meds. Encouraged heart healthy diet such as the DASH diet and exercise as tolerated.       Asthma with exacerbation    Recently treated with steroid shot, antibiotics and oral steroids. He reports his chills have resolved but he continues to struggle with wheezing, cough and sob. Will rx a medrol dosepak, tessalon perles, and Albuterol inhaler to use generously over next 3 days. Report worsening symptoms and seek care as needed      Relevant Medications   methylPREDNISolone (MEDROL) 4 MG tablet      I have discontinued Mr. Attar Fish Oil, amoxicillin-clavulanate, HYDROcodone-homatropine, and predniSONE. I am also having him start on benzonatate and methylPREDNISolone. Additionally, I am having him maintain his Multiple Vitamins-Minerals (CENTRUM SILVER PO), Vitamin D, ALPRAZolam, vitamin C, tamsulosin, cyanocobalamin, polyethylene glycol powder, losartan, desoximetasone, aspirin EC, rosuvastatin, levothyroxine, lansoprazole, Azelastine HCl, Beclomethasone Diprop HFA, and albuterol.  Meds ordered this encounter  Medications  . benzonatate (TESSALON) 100 MG capsule    Sig: Take 1-2 capsules (100-200 mg total) by mouth 2 (two) times daily as needed for cough.    Dispense:  20 capsule    Refill:  0  . methylPREDNISolone (MEDROL) 4 MG tablet    Sig: 5 tab po qd X 1d then 4 tab po qd X 1d then 3 tab po qd X 1d then 2 tab  po qd then 1 tab po qd    Dispense:  15  tablet    Refill:  0     Penni Homans, MD

## 2016-07-31 NOTE — Assessment & Plan Note (Addendum)
Recently treated with steroid shot, antibiotics and oral steroids. He reports his chills have resolved but he continues to struggle with wheezing, cough and sob. Will rx a medrol dosepak, tessalon perles, and Albuterol inhaler to use generously over next 3 days. Report worsening symptoms and seek care as needed

## 2016-08-23 ENCOUNTER — Ambulatory Visit (INDEPENDENT_AMBULATORY_CARE_PROVIDER_SITE_OTHER): Payer: PPO | Admitting: Internal Medicine

## 2016-08-23 ENCOUNTER — Encounter: Payer: Self-pay | Admitting: Internal Medicine

## 2016-08-23 VITALS — BP 130/84 | HR 84 | Ht 72.0 in | Wt 201.0 lb

## 2016-08-23 DIAGNOSIS — Z0001 Encounter for general adult medical examination with abnormal findings: Secondary | ICD-10-CM | POA: Diagnosis not present

## 2016-08-23 DIAGNOSIS — Z0000 Encounter for general adult medical examination without abnormal findings: Secondary | ICD-10-CM

## 2016-08-23 DIAGNOSIS — Z9114 Patient's other noncompliance with medication regimen: Secondary | ICD-10-CM

## 2016-08-23 DIAGNOSIS — G629 Polyneuropathy, unspecified: Secondary | ICD-10-CM

## 2016-08-23 MED ORDER — AMITRIPTYLINE HCL 50 MG PO TABS: 50.0000 mg | ORAL_TABLET | Freq: Every evening | ORAL | 1 refills | Status: DC | PRN

## 2016-08-23 NOTE — Patient Instructions (Signed)
Please take all new medication as prescribed - the Amytriptilene 50 mg at night for feet pain  Please take all your medications as prescribed every day  Please continue all other medications as before, and refills have been done if requested.  Please have the pharmacy call with any other refills you may need.  Please continue your efforts at being more active, low cholesterol diet, and weight control.  You are otherwise up to date with prevention measures today.  Please keep your appointments with your specialists as you may have planned  Please return in 6 months, or sooner if needed

## 2016-08-23 NOTE — Assessment & Plan Note (Signed)
Ok for elavil 50 qhs prn,  to f/u any worsening symptoms or concerns

## 2016-08-23 NOTE — Assessment & Plan Note (Signed)
Encouraged pt to start asa 81 mg daily, and all rx meds

## 2016-08-23 NOTE — Progress Notes (Signed)
Subjective:    Patient ID: Stephen Wilkins, male    DOB: [birth year], 83 y.o.   MRN: [id number]  HPI  Here for wellness and f/u with hx limited by dementia, but wife is present to assist;  Overall doing ok;  Pt denies Chest pain, worsening SOB, DOE, wheezing, orthopnea, PND, worsening LE edema, palpitations, dizziness or syncope.  Pt denies neurological change such as new headache, facial or extremity weakness.  Pt denies polydipsia, polyuria, or low sugar symptoms. Pt states overall good compliance with treatment and medications, good tolerability, and has been trying to follow appropriate diet.  Pt denies worsening depressive symptoms, suicidal ideation or panic. No fever, night sweats, wt loss, loss of appetite, or other constitutional symptoms.  Pt states good ability with ADL's, has low fall risk, home safety reviewed and adequate, no other significant changes in hearing or vision, and not active with exercise, does not plan to be more active. Still not using symbicort as prescribed, same for statin only every few days,.Not taking asa.  Does take thyroid med reguarly. Declines adamantly cane use though has been more unsteady, no recent falls.  Does also have bilat distal feet and legs paresthesias and coldness at night, asks for tx  Past Medical History:  Diagnosis Date  . ABNORMAL ELECTROCARDIOGRAM 06/21/2007  . ALLERGIC RHINITIS 03/23/2007  . Anxiety state, unspecified 09/06/2013  . ASTHMATIC BRONCHITIS, ACUTE 04/27/2007  . BENIGN PROSTATIC HYPERTROPHY 03/23/2007  . BRONCHITIS NOT SPECIFIED AS ACUTE OR CHRONIC 04/21/2007  . BURSITIS, RIGHT HIP 07/31/2008  . COPD (chronic obstructive pulmonary disease) ([place]) 04/19/2016  . Cough 01/29/2009  . Dementia 09/01/2010  . ERECTILE DYSFUNCTION 03/23/2007  . ERECTILE DYSFUNCTION, ORGANIC 04/17/2009  . Esophageal stricture   . FREQUENCY, URINARY 04/26/2010  . GERD 03/23/2007  . HIATAL HERNIA   . HYPERLIPIDEMIA 03/23/2007  . HYPERTENSION 03/23/2007  . MILD  COGNITIVE IMPAIRMENT SO STATED 05/19/2010  . NEPHROLITHIASIS, HX OF 03/23/2007  . PSA, INCREASED 06/27/2008  . RASH-NONVESICULAR 03/23/2007  . REACTIVE AIRWAY DISEASE 01/14/2010  . SCHATZKI'S RING   . SCIATICA, RIGHT 04/28/2008  . Unspecified hypothyroidism 09/06/2013  . Wheezing 04/17/2009   Past Surgical History:  Procedure Laterality Date  . TONSILLECTOMY      reports that he has quit smoking. He has never used smokeless tobacco. He reports that he does not drink alcohol or use drugs. family history includes Cancer in his brother; Emphysema in his brother. Allergies  Allergen Reactions  . Atorvastatin     REACTION: myalgia  . Doxazosin Mesylate     REACTION: dizziness  . Hydrocodone-Homatropine Other (See Comments)    anxiety   Current Outpatient Prescriptions on File Prior to Visit  Medication Sig Dispense Refill  . albuterol (PROVENTIL HFA;VENTOLIN HFA) 108 (90 Base) MCG/ACT inhaler Inhale 2 puffs into the lungs every 6 (six) hours as needed for wheezing. 1 Inhaler 11  . ALPRAZolam (XANAX) 0.5 MG tablet Take 1 tablet (0.5 mg total) by mouth daily as needed for anxiety. 30 tablet 2  . aspirin EC 81 MG tablet Take 1 tablet (81 mg total) by mouth daily. 90 tablet 11  . Azelastine HCl 0.15 % SOLN Place 2 sprays into both nostrils 2 (two) times daily. 30 mL 0  . Cholecalciferol (VITAMIN D) 1000 UNITS capsule Take 1,000 Units by mouth daily.      . cyanocobalamin 1000 MCG tablet Take 100 mcg by mouth daily.    Marland Kitchen desoximetasone (TOPICORT) 0.25 % cream apply sparingly to affected  area twice a day as directed if needed 30 g 1  . lansoprazole (PREVACID) 30 MG capsule Take 1 capsule (30 mg total) by mouth daily. 90 capsule 3  . levothyroxine (SYNTHROID, LEVOTHROID) 112 MCG tablet Take 1 tablet (112 mcg total) by mouth daily. 90 tablet 3  . losartan (COZAAR) 50 MG tablet take 1 tablet by mouth once daily 90 tablet 3  . Multiple Vitamins-Minerals (CENTRUM SILVER PO) Take by mouth daily.        . polyethylene glycol powder (MIRALAX) powder Take 1/2 capful by mouth once daily 850 g 5  . rosuvastatin (CRESTOR) 10 MG tablet Take 1 tablet (10 mg total) by mouth daily. 90 tablet 3  . tamsulosin (FLOMAX) 0.4 MG CAPS capsule take 1 capsule by mouth once daily 90 capsule 3  . vitamin C (ASCORBIC ACID) 500 MG tablet Take 500 mg by mouth daily.     No current facility-administered medications on file prior to visit.    Review of Systems Constitutional: Negative for other unusual diaphoresis, sweats, appetite or weight changes HENT: Negative for other worsening hearing loss, ear pain, facial swelling, mouth sores or neck stiffness.   Eyes: Negative for other worsening pain, redness or other visual disturbance.  Respiratory: Negative for other stridor or swelling Cardiovascular: Negative for other palpitations or other chest pain  Gastrointestinal: Negative for worsening diarrhea or loose stools, blood in stool, distention or other pain Genitourinary: Negative for hematuria, flank pain or other change in urine volume.  Musculoskeletal: Negative for myalgias or other joint swelling.  Skin: Negative for other color change, or other wound or worsening drainage.  Neurological: Negative for other syncope or numbness. Hematological: Negative for other adenopathy or swelling Psychiatric/Behavioral: Negative for hallucinations, other worsening agitation, SI, self-injury, or new decreased concentration All other system neg per pt    Objective:   Physical Exam BP 130/84   Pulse 84   Ht 6' (1.829 m)   Wt 201 lb (91.2 kg)   SpO2 98%   BMI 27.26 kg/m  VS noted,  Constitutional: Pt is oriented to person, place, and time. Appears well-developed and well-nourished, in no significant distress and comfortable Head: Normocephalic and atraumatic  Eyes: Conjunctivae and EOM are normal. Pupils are equal, round, and reactive to light Right Ear: External ear normal without discharge Left Ear: External  ear normal without discharge Nose: Nose without discharge or deformity Mouth/Throat: Oropharynx is without other ulcerations and moist  Neck: Normal range of motion. Neck supple. No JVD present. No tracheal deviation present or significant neck LA or mass Cardiovascular: Normal rate, regular rhythm, normal heart sounds and intact distal pulses.   Pulmonary/Chest: WOB normal and breath sounds without rales or wheezing  Abdominal: Soft. Bowel sounds are normal. NT. No HSM  Musculoskeletal: Normal range of motion. Exhibits no edema Lymphadenopathy: Has no other cervical adenopathy.  Neurological: Pt is alert and oriented to person, place, and time. Pt has normal reflexes. No cranial nerve deficit. Motor grossly intact, Gait intact, has decreased sens to LT to distal leg and feet Skin: Skin is warm and dry. No rash noted or new ulcerations Psychiatric:  Has normal mood and affect. Behavior is normal without agitation No other exam findings     Assessment & Plan:

## 2016-08-23 NOTE — Assessment & Plan Note (Signed)

## 2016-09-14 DIAGNOSIS — H524 Presbyopia: Secondary | ICD-10-CM | POA: Diagnosis not present

## 2016-09-28 ENCOUNTER — Ambulatory Visit (INDEPENDENT_AMBULATORY_CARE_PROVIDER_SITE_OTHER): Payer: PPO | Admitting: Internal Medicine

## 2016-09-28 VITALS — BP 144/92 | HR 77 | Ht 72.0 in | Wt 201.0 lb

## 2016-09-28 DIAGNOSIS — J449 Chronic obstructive pulmonary disease, unspecified: Secondary | ICD-10-CM

## 2016-09-28 DIAGNOSIS — L218 Other seborrheic dermatitis: Secondary | ICD-10-CM | POA: Diagnosis not present

## 2016-09-28 DIAGNOSIS — I10 Essential (primary) hypertension: Secondary | ICD-10-CM

## 2016-09-28 DIAGNOSIS — R739 Hyperglycemia, unspecified: Secondary | ICD-10-CM

## 2016-09-28 DIAGNOSIS — F0390 Unspecified dementia without behavioral disturbance: Secondary | ICD-10-CM

## 2016-09-28 MED ORDER — VALSARTAN 160 MG PO TABS: 160.0000 mg | ORAL_TABLET | Freq: Every day | ORAL | 3 refills | Status: DC

## 2016-09-28 NOTE — Progress Notes (Signed)
Subjective:    Patient ID: Stephen Wilkins, male    DOB: 05-17-1927, 83 y.o.   MRN: [id number]  HPI  Here to f/u; overall doing ok,  Pt denies chest pain, increasing sob or doe, wheezing, orthopnea, PND, increased LE swelling, palpitations, dizziness or syncope.  Pt denies new neurological symptoms such as new headache, or facial or extremity weakness or numbness.  Pt denies polydipsia, polyuria, or low sugar episode.  Pt states overall good compliance with meds, mostly trying to follow appropriate diet, with wt overall stable, He is now convinced that his BP's at home have been mild increased in the past week.  Blames it on change of generic pill by the pharmacy and would like it changed    BP Readings from Last 3 Encounters:  09/28/16 (!) 144/92  08/23/16 130/84  07/30/16 124/78  Dementia overall stable symptomatically with gradual worsening at best, and not assoc with behavioral changes such as hallucinations, paranoia, or agitation. Past Medical History:  Diagnosis Date  . ABNORMAL ELECTROCARDIOGRAM 06/21/2007  . ALLERGIC RHINITIS 03/23/2007  . Anxiety state, unspecified 09/06/2013  . ASTHMATIC BRONCHITIS, ACUTE 04/27/2007  . BENIGN PROSTATIC HYPERTROPHY 03/23/2007  . BRONCHITIS NOT SPECIFIED AS ACUTE OR CHRONIC 04/21/2007  . BURSITIS, RIGHT HIP 07/31/2008  . COPD (chronic obstructive pulmonary disease) ([place]) 04/19/2016  . Cough 01/29/2009  . Dementia 09/01/2010  . ERECTILE DYSFUNCTION 03/23/2007  . ERECTILE DYSFUNCTION, ORGANIC 04/17/2009  . Esophageal stricture   . FREQUENCY, URINARY 04/26/2010  . GERD 03/23/2007  . HIATAL HERNIA   . HYPERLIPIDEMIA 03/23/2007  . HYPERTENSION 03/23/2007  . MILD COGNITIVE IMPAIRMENT SO STATED 05/19/2010  . NEPHROLITHIASIS, HX OF 03/23/2007  . PSA, INCREASED 06/27/2008  . RASH-NONVESICULAR 03/23/2007  . REACTIVE AIRWAY DISEASE 01/14/2010  . SCHATZKI'S RING   . SCIATICA, RIGHT 04/28/2008  . Unspecified hypothyroidism 09/06/2013  . Wheezing 04/17/2009   Past  Surgical History:  Procedure Laterality Date  . TONSILLECTOMY      reports that he has quit smoking. He has never used smokeless tobacco. He reports that he does not drink alcohol or use drugs. family history includes Cancer in his brother; Emphysema in his brother. Allergies  Allergen Reactions  . Atorvastatin     REACTION: myalgia  . Doxazosin Mesylate     REACTION: dizziness  . Hydrocodone-Homatropine Other (See Comments)    anxiety   Current Outpatient Prescriptions on File Prior to Visit  Medication Sig Dispense Refill  . albuterol (PROVENTIL HFA;VENTOLIN HFA) 108 (90 Base) MCG/ACT inhaler Inhale 2 puffs into the lungs every 6 (six) hours as needed for wheezing. 1 Inhaler 11  . ALPRAZolam (XANAX) 0.5 MG tablet Take 1 tablet (0.5 mg total) by mouth daily as needed for anxiety. 30 tablet 2  . amitriptyline (ELAVIL) 50 MG tablet Take 1 tablet (50 mg total) by mouth at bedtime as needed for sleep. 90 tablet 1  . aspirin EC 81 MG tablet Take 1 tablet (81 mg total) by mouth daily. 90 tablet 11  . Azelastine HCl 0.15 % SOLN Place 2 sprays into both nostrils 2 (two) times daily. 30 mL 0  . budesonide-formoterol (SYMBICORT) 160-4.5 MCG/ACT inhaler Inhale 2 puffs into the lungs 2 (two) times daily.    . Cholecalciferol (VITAMIN D) 1000 UNITS capsule Take 1,000 Units by mouth daily.      . cyanocobalamin 1000 MCG tablet Take 100 mcg by mouth daily.    Marland Kitchen desoximetasone (TOPICORT) 0.25 % cream apply sparingly to affected area twice a  day as directed if needed 30 g 1  . lansoprazole (PREVACID) 30 MG capsule Take 1 capsule (30 mg total) by mouth daily. 90 capsule 3  . levothyroxine (SYNTHROID, LEVOTHROID) 112 MCG tablet Take 1 tablet (112 mcg total) by mouth daily. 90 tablet 3  . Multiple Vitamins-Minerals (CENTRUM SILVER PO) Take by mouth daily.      . polyethylene glycol powder (MIRALAX) powder Take 1/2 capful by mouth once daily 850 g 5  . rosuvastatin (CRESTOR) 10 MG tablet Take 1 tablet (10  mg total) by mouth daily. 90 tablet 3  . tamsulosin (FLOMAX) 0.4 MG CAPS capsule take 1 capsule by mouth once daily 90 capsule 3  . vitamin C (ASCORBIC ACID) 500 MG tablet Take 500 mg by mouth daily.     No current facility-administered medications on file prior to visit.    Review of Systems  Constitutional: Negative for other unusual diaphoresis or sweats HENT: Negative for ear discharge or swelling Eyes: Negative for other worsening visual disturbances Respiratory: Negative for stridor or other swelling  Gastrointestinal: Negative for worsening distension or other blood Genitourinary: Negative for retention or other urinary change Musculoskeletal: Negative for other MSK pain or swelling Skin: Negative for color change or other new lesions Neurological: Negative for worsening tremors and other numbness  Psychiatric/Behavioral: Negative for worsening agitation or other fatigue All other system neg per pt    Objective:   Physical Exam BP (!) 144/92   Pulse 77   Ht 6' (1.829 m)   Wt 201 lb (91.2 kg)   SpO2 98%   BMI 27.26 kg/m  VS noted,  Constitutional: Pt appears in NAD HENT: Head: NCAT.  Right Ear: External ear normal.  Left Ear: External ear normal.  Eyes: . Pupils are equal, round, and reactive to light. Conjunctivae and EOM are normal Nose: without d/c or deformity Neck: Neck supple. Gross normal ROM Cardiovascular: Normal rate and regular rhythm.   Pulmonary/Chest: Effort normal and breath sounds without rales or wheezing.  Abd:  Soft, NT, ND, + BS, no organomegaly Neurological: Pt is alert. At baseline orientation -c/w dementia, motor grossly intact Skin: Skin is warm. No rashes, other new lesions, no LE edema Psychiatric: Pt behavior is normal without agitation  No other exam findings Lab Results  Component Value Date   WBC 8.2 07/21/2016   HGB 13.6 07/21/2016   HCT 40.8 07/21/2016   PLT 270.0 07/21/2016   GLUCOSE 106 (H) 07/21/2016   CHOL 201 (H) 07/21/2016     TRIG 152.0 (H) 07/21/2016   HDL 51.30 07/21/2016   LDLDIRECT 142.0 07/29/2015   LDLCALC 119 (H) 07/21/2016   ALT 10 07/21/2016   AST 17 07/21/2016   NA 140 07/21/2016   K 4.9 07/21/2016   CL 103 07/21/2016   CREATININE 1.57 (H) 07/21/2016   BUN 24 (H) 07/21/2016   CO2 31 07/21/2016   TSH 4.40 07/21/2016   PSA 3.94 03/12/2014   HGBA1C 6.1 07/21/2016        Assessment & Plan:

## 2016-09-28 NOTE — Patient Instructions (Signed)
Ok to stop the losartan 50 mg\  Please take all new medication as prescribed - the generic Diovan 160 mg - 1 per day  Please continue all other medications as before, and refills have been done if requested.  Please have the pharmacy call with any other refills you may need.  Please continue your efforts at being more active, low cholesterol diet, and weight control.  Please keep your appointments with your specialists as you may have planned

## 2016-10-01 NOTE — Assessment & Plan Note (Signed)
stable overall by history and exam, recent data reviewed with pt, and pt to continue medical treatment as before,  to f/u any worsening symptoms or concerns  

## 2016-10-01 NOTE — Assessment & Plan Note (Signed)
Mild uncontrolled, ok to change the losartan to diovan 160 qd, cont to monitor BP at home and next visit, o/;w stable

## 2016-10-01 NOTE — Assessment & Plan Note (Signed)
stable overall by history and exam, recent data reviewed with pt, and pt to continue medical treatment as before,  to f/u any worsening symptoms or concerns Lab Results  Component Value Date   HGBA1C 6.1 07/21/2016

## 2016-10-01 NOTE — Assessment & Plan Note (Signed)
Stable,  No behavior change, to f/u any worsening symptoms or concerns

## 2016-10-10 ENCOUNTER — Encounter: Payer: Self-pay | Admitting: Internal Medicine

## 2016-10-11 NOTE — Telephone Encounter (Signed)
shirron to help pt make ROV please

## 2016-10-14 ENCOUNTER — Encounter: Payer: Self-pay | Admitting: Internal Medicine

## 2016-10-14 ENCOUNTER — Ambulatory Visit: Payer: PPO | Admitting: Internal Medicine

## 2016-10-14 ENCOUNTER — Ambulatory Visit (INDEPENDENT_AMBULATORY_CARE_PROVIDER_SITE_OTHER): Payer: PPO | Admitting: Internal Medicine

## 2016-10-14 VITALS — BP 124/78 | HR 73 | Ht 72.0 in | Wt 201.0 lb

## 2016-10-14 DIAGNOSIS — J45909 Unspecified asthma, uncomplicated: Secondary | ICD-10-CM | POA: Diagnosis not present

## 2016-10-14 DIAGNOSIS — I10 Essential (primary) hypertension: Secondary | ICD-10-CM

## 2016-10-14 DIAGNOSIS — R739 Hyperglycemia, unspecified: Secondary | ICD-10-CM

## 2016-10-14 MED ORDER — LOSARTAN POTASSIUM 50 MG PO TABS: 50.0000 mg | ORAL_TABLET | Freq: Every day | ORAL | 3 refills | Status: DC

## 2016-10-14 MED ORDER — BECLOMETHASONE DIPROPIONATE 80 MCG/ACT IN AERS: 1.0000 | INHALATION_SPRAY | Freq: Two times a day (BID) | RESPIRATORY_TRACT | 12 refills | Status: DC

## 2016-10-14 NOTE — Patient Instructions (Signed)
OK to change the diovan 160 mg to the losartan 50 mg per day  OK to change the symbicort to Qvar  Please continue all other medications as before, and refills have been done if requested.  Please have the pharmacy call with any other refills you may need.  Please keep your appointments with your specialists as you may have planned

## 2016-10-14 NOTE — Progress Notes (Signed)
Subjective:    Patient ID: Stephen Wilkins, male    DOB: Aug 13, 1927, 83 y.o.   MRN: [id number]  HPI  Here with c/o BP less well controlled since his pharmacy changed generic diovan from one to another manufacturer.  BP at home 150s. Checks several times per day  Pt denies chest pain, increased sob or doe, wheezing, orthopnea, PND, increased LE swelling, palpitations, dizziness or syncope.  Pt denies new neurological symptoms such as new headache, or facial or extremity weakness or numbness  Dementia overall stable symptomatically, and not assoc with behavioral changes such as hallucinations, paranoia, or agitation. Also now believes the qvar was better than the symbicort, asks for change back as well.   Pt denies polydipsia, polyuria, or low sugar symptoms  Past Medical History:  Diagnosis Date  . ABNORMAL ELECTROCARDIOGRAM 06/21/2007  . ALLERGIC RHINITIS 03/23/2007  . Anxiety state, unspecified 09/06/2013  . ASTHMATIC BRONCHITIS, ACUTE 04/27/2007  . BENIGN PROSTATIC HYPERTROPHY 03/23/2007  . BRONCHITIS NOT SPECIFIED AS ACUTE OR CHRONIC 04/21/2007  . BURSITIS, RIGHT HIP 07/31/2008  . COPD (chronic obstructive pulmonary disease) ([place]) 04/19/2016  . Cough 01/29/2009  . Dementia 09/01/2010  . ERECTILE DYSFUNCTION 03/23/2007  . ERECTILE DYSFUNCTION, ORGANIC 04/17/2009  . Esophageal stricture   . FREQUENCY, URINARY 04/26/2010  . GERD 03/23/2007  . HIATAL HERNIA   . HYPERLIPIDEMIA 03/23/2007  . HYPERTENSION 03/23/2007  . MILD COGNITIVE IMPAIRMENT SO STATED 05/19/2010  . NEPHROLITHIASIS, HX OF 03/23/2007  . PSA, INCREASED 06/27/2008  . RASH-NONVESICULAR 03/23/2007  . REACTIVE AIRWAY DISEASE 01/14/2010  . SCHATZKI'S RING   . SCIATICA, RIGHT 04/28/2008  . Unspecified hypothyroidism 09/06/2013  . Wheezing 04/17/2009   Past Surgical History:  Procedure Laterality Date  . TONSILLECTOMY      reports that he has quit smoking. He has never used smokeless tobacco. He reports that he does not drink alcohol or  use drugs. family history includes Cancer in his brother and unknown relative; Emphysema in his brother; Hypertension in his unknown relative. Allergies  Allergen Reactions  . Atorvastatin     REACTION: myalgia  . Doxazosin Mesylate     REACTION: dizziness  . Hydrocodone-Homatropine Other (See Comments)    anxiety   Current Outpatient Prescriptions on File Prior to Visit  Medication Sig Dispense Refill  . albuterol (PROVENTIL HFA;VENTOLIN HFA) 108 (90 Base) MCG/ACT inhaler Inhale 2 puffs into the lungs every 6 (six) hours as needed for wheezing. 1 Inhaler 11  . ALPRAZolam (XANAX) 0.5 MG tablet Take 1 tablet (0.5 mg total) by mouth daily as needed for anxiety. 30 tablet 2  . amitriptyline (ELAVIL) 50 MG tablet Take 1 tablet (50 mg total) by mouth at bedtime as needed for sleep. 90 tablet 1  . aspirin EC 81 MG tablet Take 1 tablet (81 mg total) by mouth daily. 90 tablet 11  . Azelastine HCl 0.15 % SOLN Place 2 sprays into both nostrils 2 (two) times daily. 30 mL 0  . Cholecalciferol (VITAMIN D) 1000 UNITS capsule Take 1,000 Units by mouth daily.      . cyanocobalamin 1000 MCG tablet Take 100 mcg by mouth daily.    Marland Kitchen desoximetasone (TOPICORT) 0.25 % cream apply sparingly to affected area twice a day as directed if needed 30 g 1  . lansoprazole (PREVACID) 30 MG capsule Take 1 capsule (30 mg total) by mouth daily. 90 capsule 3  . levothyroxine (SYNTHROID, LEVOTHROID) 112 MCG tablet Take 1 tablet (112 mcg total) by mouth daily. 90 tablet  3  . Multiple Vitamins-Minerals (CENTRUM SILVER PO) Take by mouth daily.      . polyethylene glycol powder (MIRALAX) powder Take 1/2 capful by mouth once daily 850 g 5  . rosuvastatin (CRESTOR) 10 MG tablet Take 1 tablet (10 mg total) by mouth daily. 90 tablet 3  . tamsulosin (FLOMAX) 0.4 MG CAPS capsule take 1 capsule by mouth once daily 90 capsule 3  . vitamin C (ASCORBIC ACID) 500 MG tablet Take 500 mg by mouth daily.     No current facility-administered  medications on file prior to visit.    Review of Systems  Constitutional: Negative for other unusual diaphoresis or sweats HENT: Negative for ear discharge or swelling Eyes: Negative for other worsening visual disturbances Respiratory: Negative for stridor or other swelling  Gastrointestinal: Negative for worsening distension or other blood Genitourinary: Negative for retention or other urinary change Musculoskeletal: Negative for other MSK pain or swelling Skin: Negative for color change or other new lesions Neurological: Negative for worsening tremors and other numbness  Psychiatric/Behavioral: Negative for worsening agitation or other fatigue All other system neg per pt though limited by dementia    Objective:   Physical Exam BP 124/78   Pulse 73   Ht 6' (1.829 m)   Wt 201 lb (91.2 kg)   SpO2 98%   BMI 27.26 kg/m  VS noted,  Constitutional: Pt appears in NAD HENT: Head: NCAT.  Right Ear: External ear normal.  Left Ear: External ear normal.  Eyes: . Pupils are equal, round, and reactive to light. Conjunctivae and EOM are normal Nose: without d/c or deformity Neck: Neck supple. Gross normal ROM Cardiovascular: Normal rate and regular rhythm.   Pulmonary/Chest: Effort normal and breath sounds decreased without rales or wheezing.  Neurological: Pt is alert. At baseline orientation, motor grossly intact Skin: Skin is warm. No rashes, other new lesions, no LE edema Psychiatric: Pt behavior is normal without agitation  No other exam findings    Assessment & Plan:

## 2016-10-15 NOTE — Assessment & Plan Note (Signed)
Lab Results  Component Value Date   HGBA1C 6.1 07/21/2016   stable overall by history and exam, recent data reviewed with pt, and pt to continue medical treatment as before,  to f/u any worsening symptoms or concerns

## 2016-10-15 NOTE — Assessment & Plan Note (Signed)
Ok for change back from symbicort to qvar,  to f/u any worsening symptoms or concerns

## 2016-10-15 NOTE — Assessment & Plan Note (Signed)
BP seems ok today, elevated at home, I think ok for change to blue pill losartan and given a hardcopy, pt wife states will help find a pharmacy with the previous generic manufacturer pill

## 2016-11-08 ENCOUNTER — Ambulatory Visit (INDEPENDENT_AMBULATORY_CARE_PROVIDER_SITE_OTHER): Payer: PPO | Admitting: Internal Medicine

## 2016-11-08 ENCOUNTER — Encounter: Payer: Self-pay | Admitting: Internal Medicine

## 2016-11-08 VITALS — BP 124/76 | HR 78 | Ht 72.0 in | Wt 201.0 lb

## 2016-11-08 DIAGNOSIS — I10 Essential (primary) hypertension: Secondary | ICD-10-CM | POA: Diagnosis not present

## 2016-11-08 DIAGNOSIS — E890 Postprocedural hypothyroidism: Secondary | ICD-10-CM

## 2016-11-08 DIAGNOSIS — N189 Chronic kidney disease, unspecified: Secondary | ICD-10-CM

## 2016-11-08 DIAGNOSIS — J45909 Unspecified asthma, uncomplicated: Secondary | ICD-10-CM

## 2016-11-08 MED ORDER — LOSARTAN POTASSIUM 100 MG PO TABS: 100.0000 mg | ORAL_TABLET | Freq: Every day | ORAL | 3 refills | Status: DC

## 2016-11-08 NOTE — Progress Notes (Signed)
Subjective:    Patient ID: Stephen Wilkins, male    DOB: [birth year], 83 y.o.   MRN: [id number]  HPI  Here to f/u; overall doing ok,  Pt denies chest pain, increasing sob or doe, wheezing, orthopnea, PND, increased LE swelling, palpitations, or syncope, and has persistent but no worsening occasional dizziness without HA or focal weakness or vision change.  Pt denies new neurological symptoms such as new headache, or facial or extremity weakness or numbness.  Pt denies polydipsia, polyuria, or low sugar episode.  Pt states overall good compliance with meds, mostly trying to follow appropriate diet, with wt overall stable,  BP at home however has been consisitently > 140/90 - 160's occasionally despite good compliance with current medication. Denies hyper or hypo thyroid symptoms such as voice, skin or hair change. Wt Readings from Last 3 Encounters:  11/08/16 201 lb (91.2 kg)  10/14/16 201 lb (91.2 kg)  09/28/16 201 lb (91.2 kg)   BP Readings from Last 3 Encounters:  11/08/16 124/76  10/14/16 124/78  09/28/16 (!) 144/92   Past Medical History:  Diagnosis Date  . ABNORMAL ELECTROCARDIOGRAM 06/21/2007  . ALLERGIC RHINITIS 03/23/2007  . Anxiety state, unspecified 09/06/2013  . ASTHMATIC BRONCHITIS, ACUTE 04/27/2007  . BENIGN PROSTATIC HYPERTROPHY 03/23/2007  . BRONCHITIS NOT SPECIFIED AS ACUTE OR CHRONIC 04/21/2007  . BURSITIS, RIGHT HIP 07/31/2008  . COPD (chronic obstructive pulmonary disease) ([place]) 04/19/2016  . Cough 01/29/2009  . Dementia 09/01/2010  . ERECTILE DYSFUNCTION 03/23/2007  . ERECTILE DYSFUNCTION, ORGANIC 04/17/2009  . Esophageal stricture   . FREQUENCY, URINARY 04/26/2010  . GERD 03/23/2007  . HIATAL HERNIA   . HYPERLIPIDEMIA 03/23/2007  . HYPERTENSION 03/23/2007  . MILD COGNITIVE IMPAIRMENT SO STATED 05/19/2010  . NEPHROLITHIASIS, HX OF 03/23/2007  . PSA, INCREASED 06/27/2008  . RASH-NONVESICULAR 03/23/2007  . REACTIVE AIRWAY DISEASE 01/14/2010  . SCHATZKI'S RING   .  SCIATICA, RIGHT 04/28/2008  . Unspecified hypothyroidism 09/06/2013  . Wheezing 04/17/2009   Past Surgical History:  Procedure Laterality Date  . TONSILLECTOMY      reports that he has quit smoking. He has never used smokeless tobacco. He reports that he does not drink alcohol or use drugs. family history includes Cancer in his brother and unknown relative; Emphysema in his brother; Hypertension in his unknown relative. Allergies  Allergen Reactions  . Atorvastatin     REACTION: myalgia  . Doxazosin Mesylate     REACTION: dizziness  . Hydrocodone-Homatropine Other (See Comments)    anxiety   Current Outpatient Prescriptions on File Prior to Visit  Medication Sig Dispense Refill  . albuterol (PROVENTIL HFA;VENTOLIN HFA) 108 (90 Base) MCG/ACT inhaler Inhale 2 puffs into the lungs every 6 (six) hours as needed for wheezing. 1 Inhaler 11  . ALPRAZolam (XANAX) 0.5 MG tablet Take 1 tablet (0.5 mg total) by mouth daily as needed for anxiety. 30 tablet 2  . amitriptyline (ELAVIL) 50 MG tablet Take 1 tablet (50 mg total) by mouth at bedtime as needed for sleep. 90 tablet 1  . aspirin EC 81 MG tablet Take 1 tablet (81 mg total) by mouth daily. 90 tablet 11  . Azelastine HCl 0.15 % SOLN Place 2 sprays into both nostrils 2 (two) times daily. 30 mL 0  . beclomethasone (QVAR) 80 MCG/ACT inhaler Inhale 1 puff into the lungs 2 (two) times daily. 1 Inhaler 12  . Cholecalciferol (VITAMIN D) 1000 UNITS capsule Take 1,000 Units by mouth daily.      Marland Kitchen  cyanocobalamin 1000 MCG tablet Take 100 mcg by mouth daily.    Marland Kitchen desoximetasone (TOPICORT) 0.25 % cream apply sparingly to affected area twice a day as directed if needed 30 g 1  . lansoprazole (PREVACID) 30 MG capsule Take 1 capsule (30 mg total) by mouth daily. 90 capsule 3  . levothyroxine (SYNTHROID, LEVOTHROID) 112 MCG tablet Take 1 tablet (112 mcg total) by mouth daily. 90 tablet 3  . Multiple Vitamins-Minerals (CENTRUM SILVER PO) Take by mouth daily.        . polyethylene glycol powder (MIRALAX) powder Take 1/2 capful by mouth once daily 850 g 5  . rosuvastatin (CRESTOR) 10 MG tablet Take 1 tablet (10 mg total) by mouth daily. 90 tablet 3  . tamsulosin (FLOMAX) 0.4 MG CAPS capsule take 1 capsule by mouth once daily 90 capsule 3  . vitamin C (ASCORBIC ACID) 500 MG tablet Take 500 mg by mouth daily.     No current facility-administered medications on file prior to visit.    Review of Systems  Constitutional: Negative for other unusual diaphoresis or sweats HENT: Negative for ear discharge or swelling Eyes: Negative for other worsening visual disturbances Respiratory: Negative for stridor or other swelling  Gastrointestinal: Negative for worsening distension or other blood Genitourinary: Negative for retention or other urinary change Musculoskeletal: Negative for other MSK pain or swelling Skin: Negative for color change or other new lesions Neurological: Negative for worsening tremors and other numbness  Psychiatric/Behavioral: Negative for worsening agitation or other fatigue All other system neg per pt    Objective:   Physical Exam BP 124/76   Pulse 78   Ht 6' (1.829 m)   Wt 201 lb (91.2 kg)   SpO2 99%   BMI 27.26 kg/m  VS noted, not ill appearing Constitutional: Pt appears in NAD HENT: Head: NCAT.  Right Ear: External ear normal.  Left Ear: External ear normal.  Eyes: . Pupils are equal, round, and reactive to light. Conjunctivae and EOM are normal Nose: without d/c or deformity Neck: Neck supple. Gross normal ROM Cardiovascular: Normal rate and regular rhythm.   Pulmonary/Chest: Effort normal and breath sounds without rales or wheezing.  Neurological: Pt is alert. At baseline orientation, motor grossly intact Skin: Skin is warm. No rashes, other new lesions, no LE edema Psychiatric: Pt behavior is normal without agitation  No other exam findings Lab Results  Component Value Date   WBC 8.2 07/21/2016   HGB 13.6  07/21/2016   HCT 40.8 07/21/2016   PLT 270.0 07/21/2016   GLUCOSE 106 (H) 07/21/2016   CHOL 201 (H) 07/21/2016   TRIG 152.0 (H) 07/21/2016   HDL 51.30 07/21/2016   LDLDIRECT 142.0 07/29/2015   LDLCALC 119 (H) 07/21/2016   ALT 10 07/21/2016   AST 17 07/21/2016   NA 140 07/21/2016   K 4.9 07/21/2016   CL 103 07/21/2016   CREATININE 1.57 (H) 07/21/2016   BUN 24 (H) 07/21/2016   CO2 31 07/21/2016   TSH 4.40 07/21/2016   PSA 3.94 03/12/2014   HGBA1C 6.1 07/21/2016       Assessment & Plan:

## 2016-11-08 NOTE — Patient Instructions (Signed)
OK to increase the losartan to 100 mg  Please continue to monitor the blood pressure, with the goal being and average of less than 150/90  Please continue all other medications as before, and refills have been done if requested.  Please have the pharmacy call with any other refills you may need.  Please keep your appointments with your specialists as you may have planned

## 2016-11-11 NOTE — Assessment & Plan Note (Signed)
Ok for f/u TFT's per pt request

## 2016-11-11 NOTE — Assessment & Plan Note (Signed)
stable overall by history and exam, recent data reviewed with pt, and pt to continue medical treatment as before,  to f/u any worsening symptoms or concerns Lab Results  Component Value Date   CREATININE 1.57 (H) 07/21/2016

## 2016-11-11 NOTE — Assessment & Plan Note (Signed)
stable overall by history and exam, and pt to continue medical treatment as before,  to f/u any worsening symptoms or concerns 

## 2016-11-11 NOTE — Assessment & Plan Note (Signed)
Mild uncontrolled, to increase the losartan to 100 mg daily

## 2016-11-21 ENCOUNTER — Other Ambulatory Visit: Payer: Self-pay | Admitting: Internal Medicine

## 2016-11-29 ENCOUNTER — Encounter: Payer: Self-pay | Admitting: Internal Medicine

## 2016-11-29 DIAGNOSIS — R269 Unspecified abnormalities of gait and mobility: Secondary | ICD-10-CM

## 2016-12-01 ENCOUNTER — Emergency Department (HOSPITAL_BASED_OUTPATIENT_CLINIC_OR_DEPARTMENT_OTHER): Payer: PPO

## 2016-12-01 ENCOUNTER — Encounter (HOSPITAL_BASED_OUTPATIENT_CLINIC_OR_DEPARTMENT_OTHER): Payer: Self-pay | Admitting: Emergency Medicine

## 2016-12-01 ENCOUNTER — Emergency Department (HOSPITAL_BASED_OUTPATIENT_CLINIC_OR_DEPARTMENT_OTHER)
Admission: EM | Admit: 2016-12-01 | Discharge: 2016-12-01 | Disposition: A | Discharge status: Home / Self Care | Payer: PPO | Attending: Emergency Medicine | Admitting: Emergency Medicine

## 2016-12-01 DIAGNOSIS — Y999 Unspecified external cause status: Secondary | ICD-10-CM | POA: Diagnosis not present

## 2016-12-01 DIAGNOSIS — E039 Hypothyroidism, unspecified: Secondary | ICD-10-CM | POA: Insufficient documentation

## 2016-12-01 DIAGNOSIS — N189 Chronic kidney disease, unspecified: Secondary | ICD-10-CM | POA: Diagnosis not present

## 2016-12-01 DIAGNOSIS — Z79899 Other long term (current) drug therapy: Secondary | ICD-10-CM | POA: Diagnosis not present

## 2016-12-01 DIAGNOSIS — I129 Hypertensive chronic kidney disease with stage 1 through stage 4 chronic kidney disease, or unspecified chronic kidney disease: Secondary | ICD-10-CM | POA: Diagnosis not present

## 2016-12-01 DIAGNOSIS — S80212A Abrasion, left knee, initial encounter: Secondary | ICD-10-CM | POA: Insufficient documentation

## 2016-12-01 DIAGNOSIS — Y929 Unspecified place or not applicable: Secondary | ICD-10-CM | POA: Diagnosis not present

## 2016-12-01 DIAGNOSIS — M25562 Pain in left knee: Secondary | ICD-10-CM | POA: Diagnosis not present

## 2016-12-01 DIAGNOSIS — Y939 Activity, unspecified: Secondary | ICD-10-CM | POA: Insufficient documentation

## 2016-12-01 DIAGNOSIS — W0110XA Fall on same level from slipping, tripping and stumbling with subsequent striking against unspecified object, initial encounter: Secondary | ICD-10-CM | POA: Insufficient documentation

## 2016-12-01 DIAGNOSIS — Z87891 Personal history of nicotine dependence: Secondary | ICD-10-CM | POA: Diagnosis not present

## 2016-12-01 DIAGNOSIS — J449 Chronic obstructive pulmonary disease, unspecified: Secondary | ICD-10-CM | POA: Insufficient documentation

## 2016-12-01 DIAGNOSIS — S8992XA Unspecified injury of left lower leg, initial encounter: Secondary | ICD-10-CM | POA: Diagnosis present

## 2016-12-01 DIAGNOSIS — J45909 Unspecified asthma, uncomplicated: Secondary | ICD-10-CM | POA: Diagnosis not present

## 2016-12-01 DIAGNOSIS — M25462 Effusion, left knee: Secondary | ICD-10-CM | POA: Diagnosis not present

## 2016-12-01 MED ORDER — CEPHALEXIN 250 MG PO CAPS
500.0000 mg | ORAL_CAPSULE | Freq: Once | ORAL | Status: AC
  Administered 2016-12-01: 500 mg via ORAL
  Filled 2016-12-01: qty 2

## 2016-12-01 MED ORDER — CEPHALEXIN 500 MG PO CAPS: 500.0000 mg | ORAL_CAPSULE | Freq: Two times a day (BID) | ORAL | 0 refills | Status: DC

## 2016-12-01 NOTE — ED Triage Notes (Signed)
Pt states he fell in the yard on mon. Pt has left knee pain and abrasion to left knee. Mild swelling to left knee.

## 2016-12-01 NOTE — ED Provider Notes (Signed)
[place] [facility] Provider Note   CSN: [id number] Arrival date & time: 12/01/16  0520     History   Chief Complaint Chief Complaint  Patient presents with  . Knee Injury    HPI Stephen Wilkins is a 83 y.o. male.  The history is provided by the patient and the spouse.  Knee Pain   This is a new problem. The current episode started more than 2 days ago. The problem occurs daily. The problem has been gradually worsening. The pain is present in the left knee. The quality of the pain is described as aching. The pain is mild. He has tried OTC ointments for the symptoms. The treatment provided no relief.   Pt reports he had accidental fall on 8/27 He lost his balance and scraped his left knee on the ground He has kept wound clean and placed OTC ointments on leg but it now is swelling and red No fever/chills No other acute injury TDAP UTD He reports increasing worsening balance issues PCP has ordered physical therapy He thinks he doesn't "pick my feet up"   Past Medical History:  Diagnosis Date  . ABNORMAL ELECTROCARDIOGRAM 06/21/2007  . ALLERGIC RHINITIS 03/23/2007  . Anxiety state, unspecified 09/06/2013  . ASTHMATIC BRONCHITIS, ACUTE 04/27/2007  . BENIGN PROSTATIC HYPERTROPHY 03/23/2007  . BRONCHITIS NOT SPECIFIED AS ACUTE OR CHRONIC 04/21/2007  . BURSITIS, RIGHT HIP 07/31/2008  . COPD (chronic obstructive pulmonary disease) ([place]) 04/19/2016  . Cough 01/29/2009  . Dementia 09/01/2010  . ERECTILE DYSFUNCTION 03/23/2007  . ERECTILE DYSFUNCTION, ORGANIC 04/17/2009  . Esophageal stricture   . FREQUENCY, URINARY 04/26/2010  . GERD 03/23/2007  . HIATAL HERNIA   . HYPERLIPIDEMIA 03/23/2007  . HYPERTENSION 03/23/2007  . MILD COGNITIVE IMPAIRMENT SO STATED 05/19/2010  . NEPHROLITHIASIS, HX OF 03/23/2007  . PSA, INCREASED 06/27/2008  . RASH-NONVESICULAR 03/23/2007  . REACTIVE AIRWAY DISEASE 01/14/2010  . SCHATZKI'S RING   . SCIATICA, RIGHT 04/28/2008  . Unspecified hypothyroidism  09/06/2013  . Wheezing 04/17/2009    Patient Active Problem List   Diagnosis Date Noted  . Non compliance w medication regimen 08/23/2016  . Peripheral neuropathy 08/23/2016  . COPD (chronic obstructive pulmonary disease) ([place]) 04/19/2016  . Hyperglycemia 02/10/2016  . Preventative health care 07/29/2015  . CAP (community acquired pneumonia) 05/30/2015  . Abnormal CXR 05/30/2015  . Cough 04/22/2015  . Asthma with exacerbation 04/22/2015  . Urine abnormality 04/22/2015  . Abdominal pain 09/11/2014  . Abnormal liver function test 09/11/2014  . Dysphagia 07/29/2014  . Anxiety state 09/06/2013  . CKD (chronic kidney disease) 09/06/2013  . Hypothyroidism 09/06/2013  . Rash and nonspecific skin eruption 02/27/2013  . Dizziness and giddiness 01/04/2013  . Abnormal TSH 06/04/2012  . Gross hematuria 01/09/2012  . Bruising 06/05/2011  . Dementia 09/01/2010  . Asthma 01/14/2010  . ERECTILE DYSFUNCTION, ORGANIC 04/17/2009  . FATIGUE 06/27/2008  . PSA, INCREASED 06/27/2008  . SCIATICA, RIGHT 04/28/2008  . SCHATZKI'S RING 07/18/2007  . HIATAL HERNIA 07/18/2007  . ABNORMAL ELECTROCARDIOGRAM 06/21/2007  . Hyperlipidemia 03/23/2007  . Essential hypertension 03/23/2007  . ALLERGIC RHINITIS 03/23/2007  . GERD 03/23/2007  . BENIGN PROSTATIC HYPERTROPHY 03/23/2007  . NEPHROLITHIASIS, HX OF 03/23/2007    Past Surgical History:  Procedure Laterality Date  . TONSILLECTOMY         Home Medications    Prior to Admission medications   Medication Sig Start Date End Date Taking? Authorizing Provider  albuterol (PROVENTIL HFA;VENTOLIN HFA) 108 (90 Base) MCG/ACT inhaler Inhale  2 puffs into the lungs every 6 (six) hours as needed for wheezing. 07/21/16 10/26/17  Biagio Borg, MD  ALPRAZolam Duanne Moron) 0.5 MG tablet Take 1 tablet (0.5 mg total) by mouth daily as needed for anxiety. 07/29/14   Biagio Borg, MD  amitriptyline (ELAVIL) 50 MG tablet Take 1 tablet (50 mg total) by mouth at bedtime as  needed for sleep. 08/23/16   Biagio Borg, MD  aspirin EC 81 MG tablet Take 1 tablet (81 mg total) by mouth daily. 02/10/16   Biagio Borg, MD  Azelastine HCl 0.15 % SOLN Place 2 sprays into both nostrils 2 (two) times daily. 04/01/16   Harrison Mons, PA-C  beclomethasone (QVAR) 80 MCG/ACT inhaler Inhale 1 puff into the lungs 2 (two) times daily. 10/14/16   Biagio Borg, MD  Cholecalciferol (VITAMIN D) 1000 UNITS capsule Take 1,000 Units by mouth daily.      [provider]  cyanocobalamin 1000 MCG tablet Take 100 mcg by mouth daily.    [provider]  desoximetasone (TOPICORT) 0.25 % cream apply sparingly to affected area twice a day as directed if needed 02/10/16   Biagio Borg, MD  lansoprazole (PREVACID) 30 MG capsule Take 1 capsule (30 mg total) by mouth daily. 02/23/16   Biagio Borg, MD  levothyroxine (SYNTHROID, LEVOTHROID) 112 MCG tablet Take 1 tablet (112 mcg total) by mouth daily. 02/17/16   Biagio Borg, MD  losartan (COZAAR) 100 MG tablet Take 1 tablet (100 mg total) by mouth daily. 11/08/16   Biagio Borg, MD  Multiple Vitamins-Minerals (CENTRUM SILVER PO) Take by mouth daily.      [provider]  polyethylene glycol powder (MIRALAX) powder Take 1/2 capful by mouth once daily 01/28/15   Biagio Borg, MD  rosuvastatin (CRESTOR) 10 MG tablet Take 1 tablet (10 mg total) by mouth daily. 02/10/16   Biagio Borg, MD  tamsulosin [facility]) 0.4 MG CAPS capsule take 1 capsule by mouth at bedtime 11/21/16   Biagio Borg, MD  vitamin C (ASCORBIC ACID) 500 MG tablet Take 500 mg by mouth daily.    [provider]    Family History Family History  Problem Relation Age of Onset  . Cancer Brother   . Cancer Unknown        lung cancer  . Hypertension Unknown   . Emphysema Brother     Social History Social History  Substance Use Topics  . Smoking status: Former Research scientist (life sciences)  . Smokeless tobacco: Never Used  . Alcohol use No     Allergies     Atorvastatin; Doxazosin mesylate; and Hydrocodone-homatropine   Review of Systems Review of Systems  Constitutional: Negative for chills and fever.  Gastrointestinal: Negative for vomiting.  Musculoskeletal: Positive for arthralgias and joint swelling.  Skin: Positive for wound.  All other systems reviewed and are negative.    Physical Exam Updated Vital Signs BP (!) 163/87 (BP Location: Right Arm)   Pulse 86   Temp 98.2 F (36.8 C) (Oral)   Resp 18   Ht 1.829 m (6')   Wt 90.7 kg (200 lb)   SpO2 95%   BMI 27.12 kg/m   Physical Exam CONSTITUTIONAL: Well developed/well nourished, elderly but appears younger than stated age HEAD: Normocephalic/atraumatic EYES: EOMI ENMT: Mucous membranes moist NECK: supple no meningeal signs SPINE/BACK:entire spine nontender CV: S1/S2 noted, no murmurs/rubs/gallops noted LUNGS: Lungs are clear to auscultation bilaterally, no apparent distress ABDOMEN: soft, nontender  NEURO: Pt is awake/alert/appropriate, moves all extremitiesx4.  No facial droop.  No focal neuro deficits noted.  No weakness noted EXTREMITIES: pulses normal/equal, full ROM, mild tenderness to palpation of left knee.  No crepitus.  He can flex/extend left knee.  See photo SKIN: warm, color normal, see photo PSYCH: no abnormalities of mood noted, alert and oriented to situation    Patient gave verbal permission to utilize photo for medical documentation only The image was not stored on any personal device   ED Treatments / Results  Labs (all labs ordered are listed, but only abnormal results are displayed) Labs Reviewed - No data to display  EKG  EKG Interpretation None       Radiology No results found.  Procedures Procedures (including critical care time)  Medications Ordered in ED Medications  cephALEXin (KEFLEX) capsule 500 mg (not administered)     Initial Impression / Assessment and Plan / ED Course  I have reviewed the triage vital signs and  the nursing notes.  Pertinent imaging results that were available during my care of the patient were reviewed by me and considered in my medical decision making (see chart for details).     Pt well appearing He has full ROM of left knee There is concern for early cellulitis due to non healing wound Will start keflex Discussed appropriate wound care  As for balance issues, pt ambulated without difficulty here No ataxia F/u with for this issue   Final Clinical Impressions(s) / ED Diagnoses   Final diagnoses:  Acute pain of left knee  Abrasion of left knee, initial encounter    New Prescriptions New Prescriptions   CEPHALEXIN (KEFLEX) 500 MG CAPSULE    Take 1 capsule (500 mg total) by mouth 2 (two) times daily.     Ripley Fraise, MD 12/01/16 [phone]

## 2016-12-01 NOTE — ED Notes (Signed)
Patient transported to X-ray 

## 2016-12-15 DIAGNOSIS — H26492 Other secondary cataract, left eye: Secondary | ICD-10-CM | POA: Diagnosis not present

## 2017-01-04 DIAGNOSIS — H26491 Other secondary cataract, right eye: Secondary | ICD-10-CM | POA: Diagnosis not present

## 2017-01-10 ENCOUNTER — Ambulatory Visit: Payer: PPO | Attending: Internal Medicine | Admitting: Physical Therapy

## 2017-01-10 ENCOUNTER — Encounter: Payer: Self-pay | Admitting: Physical Therapy

## 2017-01-10 DIAGNOSIS — R2689 Other abnormalities of gait and mobility: Secondary | ICD-10-CM | POA: Insufficient documentation

## 2017-01-10 DIAGNOSIS — R2681 Unsteadiness on feet: Secondary | ICD-10-CM | POA: Diagnosis not present

## 2017-01-10 DIAGNOSIS — R42 Dizziness and giddiness: Secondary | ICD-10-CM | POA: Insufficient documentation

## 2017-01-10 NOTE — Therapy (Signed)
[facility] [address] [place], [place], [address] Phone: [phone]   Fax:  [phone]  Physical Therapy Evaluation  Patient Details  Name: Stephen Wilkins MRN: [id number] Date of Birth: Aug 17, 1927 Referring Provider: Cathlean Cower MD  Encounter Date: 01/10/2017      PT End of Session - 01/10/17 1339    Visit Number 1   Number of Visits 5   Date for PT Re-Evaluation 02/09/17   Authorization Type [employer]; G-code and PN 10th visits   PT Start Time 8182   PT Stop Time 1236   PT Time Calculation (min) 51 min   Equipment Utilized During Treatment --  used pt's leather belt   Activity Tolerance Patient tolerated treatment well   Behavior During Therapy WFL for tasks assessed/performed      Past Medical History:  Diagnosis Date  . ABNORMAL ELECTROCARDIOGRAM 06/21/2007  . ALLERGIC RHINITIS 03/23/2007  . Anxiety state, unspecified 09/06/2013  . ASTHMATIC BRONCHITIS, ACUTE 04/27/2007  . BENIGN PROSTATIC HYPERTROPHY 03/23/2007  . BRONCHITIS NOT SPECIFIED AS ACUTE OR CHRONIC 04/21/2007  . BURSITIS, RIGHT HIP 07/31/2008  . COPD (chronic obstructive pulmonary disease) ([place]) 04/19/2016  . Cough 01/29/2009  . Dementia 09/01/2010  . ERECTILE DYSFUNCTION 03/23/2007  . ERECTILE DYSFUNCTION, ORGANIC 04/17/2009  . Esophageal stricture   . FREQUENCY, URINARY 04/26/2010  . GERD 03/23/2007  . HIATAL HERNIA   . HYPERLIPIDEMIA 03/23/2007  . HYPERTENSION 03/23/2007  . MILD COGNITIVE IMPAIRMENT SO STATED 05/19/2010  . NEPHROLITHIASIS, HX OF 03/23/2007  . PSA, INCREASED 06/27/2008  . RASH-NONVESICULAR 03/23/2007  . REACTIVE AIRWAY DISEASE 01/14/2010  . SCHATZKI'S RING   . SCIATICA, RIGHT 04/28/2008  . Unspecified hypothyroidism 09/06/2013  . Wheezing 04/17/2009    Past Surgical History:  Procedure Laterality Date  . TONSILLECTOMY      There were no vitals filed for this visit.       Subjective Assessment - 01/10/17  1332    Subjective Patient reports he had cataract surgery on both eyes ~ 1 year ago and ever since he has had problems with his balance. He feels a sense of imbalance with drifting/veering when he walks. States he has followed up with eye surgeon multiple times and recently had another eye surgery and goes back to see the MD next week.    Pertinent History anxiety, COPD, dementia, HTN, rt sciatica, peripheral neuropathy   Patient Stated Goals Find out if my balance problem is due to my eyes   Currently in Pain? No/denies            [facility] PT Assessment - 01/10/17 0001      Assessment   Medical Diagnosis gait disorder   Referring Provider Cathlean Cower MD   Onset Date/Surgical Date --  one year ago with cataract surgery   Hand Dominance Right   Prior Therapy none     Precautions   Precautions Fall     Balance Screen   Has the patient fallen in the past 6 months Yes  pt intially denied fall, then stated didn't remember   How many times? 1   Has the patient had a decrease in activity level because of a fear of falling?  Yes  cut back golf game   Is the patient reluctant to leave their home because of a fear of falling?  No     Home Environment   Living Environment Private residence   Living Arrangements Spouse/significant other   Available Help at Discharge  Family;Available 24 hours/day   Type of Home House   Home Access Stairs to enter   Entrance Stairs-Number of Steps 2   Entrance Stairs-Rails Right;Left   Home Layout One level   Home Equipment --  planning to put in a grab bars in shower    Additional Comments states his wife wants him to use a cane; he doesn't     Prior Function   Level of Independence Independent   Vocation Retired   Leisure walk on treadmill every day for 30 minutes; holding on with both hands     Cognition   Overall Cognitive Status History of cognitive impairments - at baseline     Observation/Other Assessments   Observations walking from  lobby to exam room, pt noted to sway/veer off his path to his right with brushing up against wall on left   Focus on Therapeutic Outcomes (FOTO)  FS 81; adjusted 62   Activities of Balance Confidence Scale (ABC Scale)  78.8     Sensation   Light Touch Appears Intact     Coordination   Gross Motor Movements are Fluid and Coordinated Yes   Fine Motor Movements are Fluid and Coordinated Yes   Heel Shin Test WNL     ROM / Strength   AROM / PROM / Strength AROM;Strength     AROM   Overall AROM  Within functional limits for tasks performed  bil LEs     Strength   Overall Strength Deficits   Overall Strength Comments bil hip abdct 5/5 (sidelying);bil knee extension 4/5, knee flexion 3+/5      Bed Mobility   Bed Mobility Supine to Sit;Sit to Supine;Rolling Right;Rolling Left;Sit to Sidelying Right;Sit to Sidelying Left   Rolling Right 6: Modified independent (Device/Increase time)   Rolling Left 6: Modified independent (Device/Increase time)   Supine to Sit 6: Modified independent (Device/Increase time)   Sit to Supine 7: Independent   Sit to Sidelying Right 6: Modified independent (Device/Increase time)   Sit to Sidelying Left 6: Modified independent (Device/Increase time)     Transfers   Transfers Sit to Stand;Stand to Sit   Sit to Stand 6: Modified independent (Device/Increase time)   Five time sit to stand comments  16.57   Stand to Sit 6: Modified independent (Device/Increase time)     Ambulation/Gait   Ambulation Distance (Feet) 60 Feet  40, 120, 40   Assistive device None   Gait Pattern Step-through pattern;Decreased stride length;Decreased trunk rotation;Narrow base of support;Poor foot clearance - left;Poor foot clearance - right;Decreased arm swing - right;Decreased arm swing - left   Ambulation Surface Level;Indoor   Gait velocity 20 ft/8.25 sec =2.42 ft/sec     Standardized Balance Assessment   Standardized Balance Assessment Timed Up and Go Test     Timed Up and  Go Test   Normal TUG (seconds) 18.97     Functional Gait  Assessment   Gait assessed  Yes   Gait Level Surface Walks 20 ft, slow speed, abnormal gait pattern, evidence for imbalance or deviates 10-15 in outside of the 12 in walkway width. Requires more than 7 sec to ambulate 20 ft.  8.25   Change in Gait Speed Able to change speed, demonstrates mild gait deviations, deviates 6-10 in outside of the 12 in walkway width, or no gait deviations, unable to achieve a major change in velocity, or uses a change in velocity, or uses an assistive device.   Gait with Horizontal Head Turns Performs  head turns smoothly with slight change in gait velocity (eg, minor disruption to smooth gait path), deviates 6-10 in outside 12 in walkway width, or uses an assistive device.   Gait with Vertical Head Turns Performs head turns with no change in gait. Deviates no more than 6 in outside 12 in walkway width.   Gait and Pivot Turn Pivot turns safely in greater than 3 sec and stops with no loss of balance, or pivot turns safely within 3 sec and stops with mild imbalance, requires small steps to catch balance.   Step Over Obstacle Is able to step over one shoe box (4.5 in total height) without changing gait speed. No evidence of imbalance.   Gait with Narrow Base of Support Ambulates less than 4 steps heel to toe or cannot perform without assistance.   Gait with Eyes Closed Walks 20 ft, slow speed, abnormal gait pattern, evidence for imbalance, deviates 10-15 in outside 12 in walkway width. Requires more than 9 sec to ambulate 20 ft.   Ambulating Backwards Walks 20 ft, uses assistive device, slower speed, mild gait deviations, deviates 6-10 in outside 12 in walkway width.   Steps Alternating feet, must use rail.   Total Score 17            Objective measurements completed on examination: See above findings.                  PT Education - 01/10/17 1312    Education provided Yes   Education Details  3 systems contributing to balance: vision, vestibular/inner ear, sensory; results of PT eval and PT POC   Person(s) Educated Patient   Methods Explanation   Comprehension Verbalized understanding;Need further instruction          PT Short Term Goals - 01/10/17 2026      PT SHORT TERM GOAL #1   Title seee LTGs           PT Long Term Goals - 01/10/17 2027      PT LONG TERM GOAL #1   Title Patient will be independent with HEP to address balance and any vestibular deficits discovered with further testing. (Target all LTGs 02/09/17)   Time 4   Period Weeks   Status New   Target Date 02/09/17     PT LONG TERM GOAL #2   Title Gait velocity will improve to >2.62Ft/sec to indicate lesser fall risk with community ambulation.    Baseline 2.42   Time 4   Period Weeks   Status New     PT LONG TERM GOAL #3   Title Patient will improve FGA  to >20/30 to demonstrate progress towards lesser fall risk.    Baseline 17/30   Time 4   Period Weeks   Status New     PT LONG TERM GOAL #4   Title Patient will be able to verbalize a plan for continued community-based activity upon discharge from PT   Time [place] - 01/10/17 1341    Clinical Impression Statement Patient presents for PT evaluation due to imbalance and gait disorder. He had recent fall (8/27) with trip to the ED with only lt knee abrasion. He feels his balance significantly declined when he had bil cataract surgery in 02/2016 and may have slowly declined even more since that time. His standardized balance and gait assessments  indicate he is at a higher risk for falls when compared to men his age. Some of patient's descriptions of his feeling off-balance were in line with what a patient would describe with a peripheral vestibular disorder. Partial assessment of this system conpleted today and plan to further assess at next visit. Anticipate patient may benefit from skilled PT to  address the deficits listed below via the interventions listed below.    History and Personal Factors relevant to plan of care: PMH-anxiety, COPD, dementia, HTN, rt sciatica, peripheral neuropathy;  Personal factors-age, unclear mechanism of injury; patient's decr cognitive status,    Clinical Presentation Evolving   Clinical Presentation due to: impaired balance has recently progressed to falling and trip to the ED   Clinical Decision Making Moderate   Rehab Potential Good   Clinical Impairments Affecting Rehab Potential dementia (may need to instruct wife in HEP for carryover)   PT Frequency 1x / week   PT Duration 4 weeks   PT Treatment/Interventions ADLs/Self Care Home Management;Canalith Repostioning;DME Instruction;Gait training;Stair training;Functional mobility training;Therapeutic activities;Therapeutic exercise;Balance training;Neuromuscular re-education;Cognitive remediation;Patient/family education;Passive range of motion;Vestibular;Visual/perceptual remediation/compensation   PT Next Visit Plan re-assess HIT, ?DVA; initiate HEP with emphasis on balance (his strength is actually pretty good)   Consulted and Agree with Plan of Care Patient      Patient will benefit from skilled therapeutic intervention in order to improve the following deficits and impairments:  Abnormal gait, Decreased balance, Decreased knowledge of use of DME, Decreased mobility, Decreased safety awareness, Difficulty walking, Dizziness, Impaired perceived functional ability, Impaired sensation, Postural dysfunction  Visit Diagnosis: Dizziness and giddiness - Plan: PT plan of care cert/re-cert  Other abnormalities of gait and mobility - Plan: PT plan of care cert/re-cert  Unsteadiness on feet - Plan: PT plan of care cert/re-cert      G-Codes - 26/33/35 June 29, 2038    Functional Assessment Tool Used (Outpatient Only) gait veloity 2.42 ft/sec; 5x sit to stand 16.57; FGA 17; TUG 18.97   Functional Limitation Mobility:  Walking and moving around   Mobility: Walking and Moving Around Current Status [phone]) At least 1 percent but less than 20 percent impaired, limited or restricted   Mobility: Walking and Moving Around Goal Status [phone]) At least 1 percent but less than 20 percent impaired, limited or restricted       Problem List Patient Active Problem List   Diagnosis Date Noted  . Non compliance w medication regimen 08/23/2016  . Peripheral neuropathy 08/23/2016  . COPD (chronic obstructive pulmonary disease) ([place]) 04/19/2016  . Hyperglycemia 02/10/2016  . Preventative health care 07/29/2015  . CAP (community acquired pneumonia) 05/30/2015  . Abnormal CXR 05/30/2015  . Cough 04/22/2015  . Asthma with exacerbation 04/22/2015  . Urine abnormality 04/22/2015  . Abdominal pain 09/11/2014  . Abnormal liver function test 09/11/2014  . Dysphagia 07/29/2014  . Anxiety state 09/06/2013  . CKD (chronic kidney disease) 09/06/2013  . Hypothyroidism 09/06/2013  . Rash and nonspecific skin eruption 02/27/2013  . Dizziness and giddiness 01/04/2013  . Abnormal TSH 06/04/2012  . Gross hematuria 01/09/2012  . Bruising 06/05/2011  . Dementia 09/01/2010  . Asthma 01/14/2010  . ERECTILE DYSFUNCTION, ORGANIC 04/17/2009  . FATIGUE 06/27/2008  . PSA, INCREASED 06/27/2008  . SCIATICA, RIGHT 04/28/2008  . SCHATZKI'S RING 07/18/2007  . HIATAL HERNIA 07/18/2007  . ABNORMAL ELECTROCARDIOGRAM 06/21/2007  . Hyperlipidemia 03/23/2007  . Essential hypertension 03/23/2007  . ALLERGIC RHINITIS 03/23/2007  . GERD 03/23/2007  . BENIGN PROSTATIC HYPERTROPHY 03/23/2007  .  NEPHROLITHIASIS, HX OF 03/23/2007    Rexanne Mano, PT 01/10/2017, 8:44 PM  [facility] [address] [place], [place], [address] Phone: [phone]   Fax:  [phone]  Name: Stephen Wilkins MRN: [id number] Date of Birth: 1927/10/28

## 2017-01-13 DIAGNOSIS — N401 Enlarged prostate with lower urinary tract symptoms: Secondary | ICD-10-CM | POA: Diagnosis not present

## 2017-01-13 DIAGNOSIS — R3912 Poor urinary stream: Secondary | ICD-10-CM | POA: Diagnosis not present

## 2017-01-13 DIAGNOSIS — N5201 Erectile dysfunction due to arterial insufficiency: Secondary | ICD-10-CM | POA: Diagnosis not present

## 2017-01-23 ENCOUNTER — Encounter: Payer: PPO | Admitting: Physical Therapy

## 2017-01-27 ENCOUNTER — Encounter: Payer: Self-pay | Admitting: Physical Therapy

## 2017-01-27 ENCOUNTER — Ambulatory Visit: Payer: PPO | Admitting: Physical Therapy

## 2017-01-27 DIAGNOSIS — R2681 Unsteadiness on feet: Secondary | ICD-10-CM

## 2017-01-27 DIAGNOSIS — R42 Dizziness and giddiness: Secondary | ICD-10-CM | POA: Diagnosis not present

## 2017-01-27 NOTE — Therapy (Signed)
[facility] [address] [place], [place], [address] Phone: [phone]   Fax:  [phone]  Physical Therapy Treatment  Patient Details  Name: Stephen Wilkins MRN: [id number] Date of Birth: 04-15-1927 Referring Provider: Cathlean Cower MD  Encounter Date: 01/27/2017      PT End of Session - 01/27/17 1243    Visit Number 2   Number of Visits 5   Date for PT Re-Evaluation 02/09/17   Authorization Type [employer]; G-code and PN 10th visits   PT Start Time 8101   PT Stop Time 1227   PT Time Calculation (min) 42 min   Equipment Utilized During Treatment --  used pt's leather belt   Activity Tolerance Patient tolerated treatment well   Behavior During Therapy WFL for tasks assessed/performed      Past Medical History:  Diagnosis Date  . ABNORMAL ELECTROCARDIOGRAM 06/21/2007  . ALLERGIC RHINITIS 03/23/2007  . Anxiety state, unspecified 09/06/2013  . ASTHMATIC BRONCHITIS, ACUTE 04/27/2007  . BENIGN PROSTATIC HYPERTROPHY 03/23/2007  . BRONCHITIS NOT SPECIFIED AS ACUTE OR CHRONIC 04/21/2007  . BURSITIS, RIGHT HIP 07/31/2008  . COPD (chronic obstructive pulmonary disease) ([place]) 04/19/2016  . Cough 01/29/2009  . Dementia 09/01/2010  . ERECTILE DYSFUNCTION 03/23/2007  . ERECTILE DYSFUNCTION, ORGANIC 04/17/2009  . Esophageal stricture   . FREQUENCY, URINARY 04/26/2010  . GERD 03/23/2007  . HIATAL HERNIA   . HYPERLIPIDEMIA 03/23/2007  . HYPERTENSION 03/23/2007  . MILD COGNITIVE IMPAIRMENT SO STATED 05/19/2010  . NEPHROLITHIASIS, HX OF 03/23/2007  . PSA, INCREASED 06/27/2008  . RASH-NONVESICULAR 03/23/2007  . REACTIVE AIRWAY DISEASE 01/14/2010  . SCHATZKI'S RING   . SCIATICA, RIGHT 04/28/2008  . Unspecified hypothyroidism 09/06/2013  . Wheezing 04/17/2009    Past Surgical History:  Procedure Laterality Date  . TONSILLECTOMY      There were no vitals filed for this visit.      Subjective Assessment - 01/27/17  1142    Subjective Reports his balance has been OK, maybe a little better since he got new shoes that hold his foot tighter.    Pertinent History anxiety, COPD, dementia, HTN, rt sciatica, peripheral neuropathy   Patient Stated Goals Find out if my balance problem is due to my eyes   Currently in Pain? No/denies                Vestibular Assessment - 01/27/17 0001      Occulomotor Exam   Occulomotor Alignment Normal   Spontaneous Absent   Gaze-induced Absent   Smooth Pursuits Intact   Saccades Intact     Vestibulo-Occular Reflex   VOR 1 Head Only (x 1 viewing) pt unable to turn head fast enough to elicit symptoms or cause visual changes    Comment HIT + to right     Visual Acuity   Static 7   Dynamic 4     Auditory   Comments reports hearing decline gradually for ~1 year; feels his left ear hears less                 [facility] PT Treatment/Exercise - 01/27/17 0001      Ambulation/Gait   Ambulation/Gait Yes   Ambulation/Gait Assistance 7: Independent   Ambulation Distance (Feet) 120 Feet  60   Assistive device None   Gait Pattern Step-through pattern;Decreased stride length;Decreased trunk rotation;Narrow base of support;Poor foot clearance - left;Poor foot clearance - right;Decreased arm swing - right;Decreased arm swing - left   Ambulation Surface  Level   Gait velocity 32.8/12.5=2.62  32.8/9.03=3.63      Patient performed all exercises added to HEP as outlined below:   Standing On One Leg Without Support .  Stand on one leg in neutral spine without support. Hold 10-30 seconds. Repeat on other leg. Do 3 repetitions for each leg. Do 5 days per week  http://bt.exer.us/36   Copyright  VHI. All rights reserved.    Tandem Walking    Beside your counter with fingers lightly touching. Walk with each foot directly in front of other, heel of one foot touching toes of other foot with each step. Both feet straight ahead. Look straight ahead. 5  reps per set, 1 sets per day, 5 days per week   When you get to the end of the counter, walk backwards to the start (see below) Copyright  VHI. All rights reserved.   AMBULATION: Walk Backward   Walk backward. Take large steps, do not drag feet. 5 reps per set, 1 sets per day, 5 days per week      Feet Together (Compliant Surface) Head Motion - Eyes Open    With eyes open, standing on compliant surface:  feet together, move head slowly: up and down x 10, then left and right x 10 Do _1___ sessions per day.  Copyright  VHI. All rights reserved.   Feet Together (Compliant Surface) Varied Arm Positions - Eyes Closed    Stand on compliant surface: with feet together and arms at your side. Close eyes and visualize upright position. Hold___30_ seconds. Repeat __3__ times per session. Do _1___ sessions per day. Copyright  VHI. All rights reserved.          Balance Exercises - 01/27/17 1242      Balance Exercises: Standing   Standing Eyes Opened Narrow base of support (BOS);Head turns;Foam/compliant surface;Solid surface   Standing Eyes Closed Narrow base of support (BOS);Wide (BOA);Foam/compliant surface;Solid surface           PT Education - 01/27/17 1239    Education provided Yes   Education Details see HEP; review of contribution of inner ear   Person(s) Educated Patient   Methods Explanation;Demonstration;Verbal cues;Handout   Comprehension Verbalized understanding;Returned demonstration;Verbal cues required;Need further instruction          PT Short Term Goals - 01/10/17 2026      PT SHORT TERM GOAL #1   Title seee LTGs           PT Long Term Goals - 01/10/17 2027      PT LONG TERM GOAL #1   Title Patient will be independent with HEP to address balance and any vestibular deficits discovered with further testing. (Target all LTGs 02/09/17)   Time 4   Period Weeks   Status New   Target Date 02/09/17     PT LONG TERM GOAL #2   Title Gait  velocity will improve to >2.62Ft/sec to indicate lesser fall risk with community ambulation.    Baseline 2.42   Time 4   Period Weeks   Status New     PT LONG TERM GOAL #3   Title Patient will improve FGA  to >20/30 to demonstrate progress towards lesser fall risk.    Baseline 17/30   Time 4   Period Weeks   Status New     PT LONG TERM GOAL #4   Title Patient will be able to verbalize a plan for continued community-based activity upon discharge from PT   Time  4   Period Weeks   Status New               Plan - 01/27/17 1245    Clinical Impression Statement Patient's Head Impulse Test + to the right and static vs dynamic visual acuity with 3 line difference both indicative of a peripheral vestibular hypofunction. Patient could not voluntarily turn his head fast enough to elicit the VOR reflex when attempting VORx1 exercise (horizontally or vertically) and therefore he was asymptomatic with VORx1. He was asymptomatic with saccades and quick head turns in standing. He demonstrated >normal sway with feet shoulder width apart, on solid surface with eyes closed. Provided patient with corner balance exercises on the pillow to stimulate his inner ear and dynamic balance activities beside a counter. Feel pt can continue to benefit from PT to improve his balance.    Rehab Potential Good   Clinical Impairments Affecting Rehab Potential dementia (may need to instruct wife in HEP for carryover)   PT Frequency 1x / week   PT Duration 4 weeks   PT Treatment/Interventions ADLs/Self Care Home Management;Canalith Repostioning;DME Instruction;Gait training;Stair training;Functional mobility training;Therapeutic activities;Therapeutic exercise;Balance training;Neuromuscular re-education;Cognitive remediation;Patient/family education;Passive range of motion;Vestibular;Visual/perceptual remediation/compensation   PT Next Visit Plan review HEP given 10/26; balance activities on compliant surfaces with  eyes closed; add to HEP as appropriate.    Consulted and Agree with Plan of Care Patient      Patient will benefit from skilled therapeutic intervention in order to improve the following deficits and impairments:  Abnormal gait, Decreased balance, Decreased knowledge of use of DME, Decreased mobility, Decreased safety awareness, Difficulty walking, Dizziness, Impaired perceived functional ability, Impaired sensation, Postural dysfunction  Visit Diagnosis: Unsteadiness on feet     Problem List Patient Active Problem List   Diagnosis Date Noted  . Non compliance w medication regimen 08/23/2016  . Peripheral neuropathy 08/23/2016  . COPD (chronic obstructive pulmonary disease) ([place]) 04/19/2016  . Hyperglycemia 02/10/2016  . Preventative health care 07/29/2015  . CAP (community acquired pneumonia) 05/30/2015  . Abnormal CXR 05/30/2015  . Cough 04/22/2015  . Asthma with exacerbation 04/22/2015  . Urine abnormality 04/22/2015  . Abdominal pain 09/11/2014  . Abnormal liver function test 09/11/2014  . Dysphagia 07/29/2014  . Anxiety state 09/06/2013  . CKD (chronic kidney disease) 09/06/2013  . Hypothyroidism 09/06/2013  . Rash and nonspecific skin eruption 02/27/2013  . Dizziness and giddiness 01/04/2013  . Abnormal TSH 06/04/2012  . Gross hematuria 01/09/2012  . Bruising 06/05/2011  . Dementia 09/01/2010  . Asthma 01/14/2010  . ERECTILE DYSFUNCTION, ORGANIC 04/17/2009  . FATIGUE 06/27/2008  . PSA, INCREASED 06/27/2008  . SCIATICA, RIGHT 04/28/2008  . SCHATZKI'S RING 07/18/2007  . HIATAL HERNIA 07/18/2007  . ABNORMAL ELECTROCARDIOGRAM 06/21/2007  . Hyperlipidemia 03/23/2007  . Essential hypertension 03/23/2007  . ALLERGIC RHINITIS 03/23/2007  . GERD 03/23/2007  . BENIGN PROSTATIC HYPERTROPHY 03/23/2007  . NEPHROLITHIASIS, HX OF 03/23/2007    Rexanne Mano, PT 01/27/2017, 1:04 PM  [facility] [address] [place], [place], [address] Phone: [phone]   Fax:  [phone]  Name: Stephen Wilkins MRN: [id number] Date of Birth: [birth year]

## 2017-01-27 NOTE — Patient Instructions (Signed)
   Standing On One Leg Without Support .  Stand on one leg in neutral spine without support. Hold 10-30 seconds. Repeat on other leg. Do 3 repetitions for each leg. Do 5 days per week  http://bt.exer.us/36   Copyright  VHI. All rights reserved.    Tandem Walking    Beside your counter with fingers lightly touching. Walk with each foot directly in front of other, heel of one foot touching toes of other foot with each step. Both feet straight ahead. Look straight ahead. 5 reps per set, 1 sets per day, 5 days per week   When you get to the end of the counter, walk backwards to the start (see below) Copyright  VHI. All rights reserved.   AMBULATION: Walk Backward   Walk backward. Take large steps, do not drag feet. 5 reps per set, 1 sets per day, 5 days per week      Feet Together (Compliant Surface) Head Motion - Eyes Open    With eyes open, standing on compliant surface:  feet together, move head slowly: up and down x 10, then left and right x 10 Do _1___ sessions per day.  Copyright  VHI. All rights reserved.   Feet Together (Compliant Surface) Varied Arm Positions - Eyes Closed    Stand on compliant surface: with feet together and arms at your side. Close eyes and visualize upright position. Hold___30_ seconds. Repeat __3__ times per session. Do _1___ sessions per day. Copyright  VHI. All rights reserved.

## 2017-01-30 ENCOUNTER — Ambulatory Visit: Payer: PPO | Admitting: Physical Therapy

## 2017-01-30 ENCOUNTER — Encounter: Payer: Self-pay | Admitting: Physical Therapy

## 2017-01-30 DIAGNOSIS — R2681 Unsteadiness on feet: Secondary | ICD-10-CM

## 2017-01-30 DIAGNOSIS — R42 Dizziness and giddiness: Secondary | ICD-10-CM | POA: Diagnosis not present

## 2017-01-30 NOTE — Patient Instructions (Signed)
Forward Step Up    Stand facing step. Hold lightly to rail. Step up leading with left leg and gently tap left toes on the step. Step left foot back down to the ground. Then step right foot up to lightly touch your toes on the step and step right foot back down.  Perform _20__ reps.  Copyright  VHI. All rights reserved.

## 2017-01-30 NOTE — Therapy (Signed)
[facility] Wil[facility] [address] [place], [place], [address] Phone: [phone]   Fax:  [phone]  Physical Therapy Treatment  Patient Details  Name: Stephen Wilkins MRN: [id number] Date of Birth: 09/14/1927 Referring Provider: Cathlean Cower MD  Encounter Date: 01/30/2017      PT End of Session - 01/30/17 1650    Visit Number 3   Number of Visits 5   Date for PT Re-Evaluation 02/09/17   Authorization Type [employer]; G-code and PN 10th visits   PT Start Time 1532   PT Stop Time 1616   PT Time Calculation (min) 44 min   Equipment Utilized During Treatment --  used pt's leather belt   Activity Tolerance Patient tolerated treatment well   Behavior During Therapy WFL for tasks assessed/performed      Past Medical History:  Diagnosis Date  . ABNORMAL ELECTROCARDIOGRAM 06/21/2007  . ALLERGIC RHINITIS 03/23/2007  . Anxiety state, unspecified 09/06/2013  . ASTHMATIC BRONCHITIS, ACUTE 04/27/2007  . BENIGN PROSTATIC HYPERTROPHY 03/23/2007  . BRONCHITIS NOT SPECIFIED AS ACUTE OR CHRONIC 04/21/2007  . BURSITIS, RIGHT HIP 07/31/2008  . COPD (chronic obstructive pulmonary disease) ([place]) 04/19/2016  . Cough 01/29/2009  . Dementia 09/01/2010  . ERECTILE DYSFUNCTION 03/23/2007  . ERECTILE DYSFUNCTION, ORGANIC 04/17/2009  . Esophageal stricture   . FREQUENCY, URINARY 04/26/2010  . GERD 03/23/2007  . HIATAL HERNIA   . HYPERLIPIDEMIA 03/23/2007  . HYPERTENSION 03/23/2007  . MILD COGNITIVE IMPAIRMENT SO STATED 05/19/2010  . NEPHROLITHIASIS, HX OF 03/23/2007  . PSA, INCREASED 06/27/2008  . RASH-NONVESICULAR 03/23/2007  . REACTIVE AIRWAY DISEASE 01/14/2010  . SCHATZKI'S RING   . SCIATICA, RIGHT 04/28/2008  . Unspecified hypothyroidism 09/06/2013  . Wheezing 04/17/2009    Past Surgical History:  Procedure Laterality Date  . TONSILLECTOMY      There were no vitals filed for this visit.      Subjective Assessment - 01/30/17  1531    Subjective No falls or near falls. Reports he has not been doing his exercises the way he should be. Acknowledges that he won't get better if he doesn't do exercises.    Pertinent History anxiety, COPD, dementia, HTN, rt sciatica, peripheral neuropathy   Patient Stated Goals Find out if my balance problem is due to my eyes   Currently in Pain? No/denies                              Balance Exercises - 01/30/17 1656      Balance Exercises: Standing   SLS with Vectors Foam/compliant surface;Upper extremity assist 1  red mat; single toe and then heel taps bubbles and cones   Rockerboard Anterior/posterior;EO;EC;30 seconds;UE support  step off/on ant and post; Lt and rt   Balance Beam blue; EO feet wide then shoulder-width; single UE support; step off/on    Tandem Gait Forward;Intermittent upper extremity support;4 reps  // bars     Standing at steps, light use of rail on rt, step taps (simulating exercise with cones) for pt to do at home for balance.   There-ex Side step-ups, 4" step with free leg into hip abdct and hold 2 sec. Pt holding one of the parallel bars.       PT Education - 01/30/17 1649    Education provided Yes   Education Details see addition to HEP; pt asking questions again re: balance systems   Person(s) Educated Patient   Methods  Explanation;Demonstration;Verbal cues;Handout   Comprehension Verbalized understanding;Returned demonstration;Verbal cues required;Need further instruction          PT Short Term Goals - 01/10/17 2026      PT SHORT TERM GOAL #1   Title seee LTGs           PT Long Term Goals - 01/10/17 2027      PT LONG TERM GOAL #1   Title Patient will be independent with HEP to address balance and any vestibular deficits discovered with further testing. (Target all LTGs 02/09/17)   Time 4   Period Weeks   Status New   Target Date 02/09/17     PT LONG TERM GOAL #2   Title Gait velocity will improve to  >2.62Ft/sec to indicate lesser fall risk with community ambulation.    Baseline 2.42   Time 4   Period Weeks   Status New     PT LONG TERM GOAL #3   Title Patient will improve FGA  to >20/30 to demonstrate progress towards lesser fall risk.    Baseline 17/30   Time 4   Period Weeks   Status New     PT LONG TERM GOAL #4   Title Patient will be able to verbalize a plan for continued community-based activity upon discharge from PT   Time [place] - 01/30/17 1651    Clinical Impression Statement Session focused on balance and vestibular training. Patient did not feel comfortable trying balance activities without UE support (even with PT holding onto him) and therefore used at least single UE support throughout. Educated re: rationale for trying without UE and pt just could not make himself let go. Patient requested to move his final 2 appts  out a few weeks to give him time to work on his HEP and see if he can improve. Agree he could benefit from consistently doing his HEP and then re-assess what changes or additions are needed.   Rehab Potential Good   Clinical Impairments Affecting Rehab Potential dementia (may need to instruct wife in HEP for carryover)   PT Frequency 1x / week   PT Duration 4 weeks   PT Treatment/Interventions ADLs/Self Care Home Management;Canalith Repostioning;DME Instruction;Gait training;Stair training;Functional mobility training;Therapeutic activities;Therapeutic exercise;Balance training;Neuromuscular re-education;Cognitive remediation;Patient/family education;Passive range of motion;Vestibular;Visual/perceptual remediation/compensation   PT Next Visit Plan re-certify and decide how many more appts and how often (original plan was for eval + 4); balance activities on compliant surfaces with eyes closed; add to HEP as appropriate.    Consulted and Agree with Plan of Care Patient      Patient will benefit from  skilled therapeutic intervention in order to improve the following deficits and impairments:  Abnormal gait, Decreased balance, Decreased knowledge of use of DME, Decreased mobility, Decreased safety awareness, Difficulty walking, Dizziness, Impaired perceived functional ability, Impaired sensation, Postural dysfunction  Visit Diagnosis: Unsteadiness on feet     Problem List Patient Active Problem List   Diagnosis Date Noted  . Non compliance w medication regimen 08/23/2016  . Peripheral neuropathy 08/23/2016  . COPD (chronic obstructive pulmonary disease) ([facility]) 04/19/2016  . Hyperglycemia 02/10/2016  . Preventative health care 07/29/2015  . CAP (community acquired pneumonia) 05/30/2015  . Abnormal CXR 05/30/2015  . Cough 04/22/2015  . Asthma with exacerbation 04/22/2015  . Urine abnormality 04/22/2015  . Abdominal pain 09/11/2014  . Abnormal  liver function test 09/11/2014  . Dysphagia 07/29/2014  . Anxiety state 09/06/2013  . CKD (chronic kidney disease) 09/06/2013  . Hypothyroidism 09/06/2013  . Rash and nonspecific skin eruption 02/27/2013  . Dizziness and giddiness 01/04/2013  . Abnormal TSH 06/04/2012  . Gross hematuria 01/09/2012  . Bruising 06/05/2011  . Dementia 09/01/2010  . Asthma 01/14/2010  . ERECTILE DYSFUNCTION, ORGANIC 04/17/2009  . FATIGUE 06/27/2008  . PSA, INCREASED 06/27/2008  . SCIATICA, RIGHT 04/28/2008  . SCHATZKI'S RING 07/18/2007  . HIATAL HERNIA 07/18/2007  . ABNORMAL ELECTROCARDIOGRAM 06/21/2007  . Hyperlipidemia 03/23/2007  . Essential hypertension 03/23/2007  . ALLERGIC RHINITIS 03/23/2007  . GERD 03/23/2007  . BENIGN PROSTATIC HYPERTROPHY 03/23/2007  . NEPHROLITHIASIS, HX OF 03/23/2007    Rexanne Mano, PT 01/30/2017, 5:00 PM  [facility] [address] [place], [place], [address] Phone: [phone]   Fax:  [phone]  Name: BRANDON WIECHMAN MRN: [id number] Date of Birth:  [birth year]

## 2017-02-06 ENCOUNTER — Encounter: Payer: PPO | Admitting: Physical Therapy

## 2017-02-14 ENCOUNTER — Encounter: Payer: Self-pay | Admitting: Physical Therapy

## 2017-02-14 ENCOUNTER — Ambulatory Visit: Payer: PPO | Attending: Internal Medicine | Admitting: Physical Therapy

## 2017-02-14 DIAGNOSIS — R2681 Unsteadiness on feet: Secondary | ICD-10-CM | POA: Diagnosis not present

## 2017-02-14 DIAGNOSIS — R2689 Other abnormalities of gait and mobility: Secondary | ICD-10-CM | POA: Insufficient documentation

## 2017-02-14 NOTE — Therapy (Signed)
[facility] [address] [place], [place], [address] Phone: [phone]   Fax:  [phone]  Physical Therapy Treatment  Patient Details  Name: Stephen Wilkins MRN: [id number] Date of Birth: [birth year] Referring Provider: Cathlean Cower MD   Encounter Date: 02/14/2017  PT End of Session - 02/14/17 1640    Visit Number  4    Number of Visits  5    Date for PT Re-Evaluation  02/09/17    Authorization Type  [employer]; G-code and PN 10th visits    PT Start Time  1535    PT Stop Time  1617    PT Time Calculation (min)  42 min    Equipment Utilized During Treatment  -- used pt's leather belt    Activity Tolerance  Patient tolerated treatment well    Behavior During Therapy  WFL for tasks assessed/performed       Past Medical History:  Diagnosis Date  . ABNORMAL ELECTROCARDIOGRAM 06/21/2007  . ALLERGIC RHINITIS 03/23/2007  . Anxiety state, unspecified 09/06/2013  . ASTHMATIC BRONCHITIS, ACUTE 04/27/2007  . BENIGN PROSTATIC HYPERTROPHY 03/23/2007  . BRONCHITIS NOT SPECIFIED AS ACUTE OR CHRONIC 04/21/2007  . BURSITIS, RIGHT HIP 07/31/2008  . COPD (chronic obstructive pulmonary disease) ([place]) 04/19/2016  . Cough 01/29/2009  . Dementia 09/01/2010  . ERECTILE DYSFUNCTION 03/23/2007  . ERECTILE DYSFUNCTION, ORGANIC 04/17/2009  . Esophageal stricture   . FREQUENCY, URINARY 04/26/2010  . GERD 03/23/2007  . HIATAL HERNIA   . HYPERLIPIDEMIA 03/23/2007  . HYPERTENSION 03/23/2007  . MILD COGNITIVE IMPAIRMENT SO STATED 05/19/2010  . NEPHROLITHIASIS, HX OF 03/23/2007  . PSA, INCREASED 06/27/2008  . RASH-NONVESICULAR 03/23/2007  . REACTIVE AIRWAY DISEASE 01/14/2010  . SCHATZKI'S RING   . SCIATICA, RIGHT 04/28/2008  . Unspecified hypothyroidism 09/06/2013  . Wheezing 04/17/2009    Past Surgical History:  Procedure Laterality Date  . TONSILLECTOMY      There were no vitals filed for this visit.  Subjective Assessment -  02/14/17 1540    Subjective  Reports he is doing 40-50 minutes on treadmill almost daily (not sure of the speed). Reports his dizziness are much better "you have really helped me!"    Pertinent History  anxiety, COPD, dementia, HTN, rt sciatica, peripheral neuropathy    Patient Stated Goals  Find out if my balance problem is due to my eyes    Currently in Pain?  No/denies                      [facility] Adult PT Treatment/Exercise - 02/14/17 1608      Ambulation/Gait   Ambulation/Gait Assistance  7: Independent    Ambulation Distance (Feet)  240 Feet    Assistive device  None    Gait Pattern  Step-through pattern;Decreased stride length;Decreased trunk rotation;Poor foot clearance - left;Poor foot clearance - right;Decreased arm swing - right;Decreased arm swing - left    Ambulation Surface  Indoor    Gait velocity  2.97 ft/sec          Balance Exercises - 02/14/17 1619      Balance Exercises: Standing   Tandem Stance  Eyes open;Foam/compliant surface;Intermittent upper extremity support    SLS  Eyes open;Foam/compliant surface;Intermittent upper extremity support;3 reps RLE max 10 sec; LLE max 3 sec    Rockerboard  Anterior/posterior;Head turns;EO;EC;Intermittent UE support step off/on anteriorl and posteriorly;     Balance Beam  blue, EO, feet shoulder width, no UE support x  30 sec; step off/on ant and post with intermittent UE support    Tandem Gait  Forward;Upper extremity support;Foam/compliant surface;3 reps    Turning  Both 180 and 360          PT Short Term Goals - 01/10/17 2026      PT SHORT TERM GOAL #1   Title  seee LTGs        PT Long Term Goals - 02/14/17 1647      PT LONG TERM GOAL #1   Title  Patient will be independent with HEP to address balance and any vestibular deficits discovered with further testing. (Target all LTGs 02/09/17)    Time  4    Period  Weeks    Status  New      PT LONG TERM GOAL #2   Title  Gait velocity will improve to  >2.62Ft/sec to indicate lesser fall risk with community ambulation.     Baseline  2.42; 02/14/17 2.97 ft/sec    Time  4    Period  Weeks    Status  Achieved      PT LONG TERM GOAL #3   Title  Patient will improve FGA  to >20/30 to demonstrate progress towards lesser fall risk.     Baseline  17/30    Time  4    Period  Weeks    Status  New      PT LONG TERM GOAL #4   Title  Patient will be able to verbalize a plan for continued community-based activity upon discharge from PT    Time  [place] - 02/14/17 1642    Clinical Impression Statement  Session focused on balance and vestibular/habituation exercises. Patient tolerated balance activities much better today with intermittent to no UE support. Discussed if pt wanted to re-schedule the last of 4 appointments we had originally planned and he stated he wanted one more session to finalize HEP and asked if we could provide the normative data that I have been referring to throughout his sessions (his gait velocity, etc and the norms for his age).     Rehab Potential  Good    Clinical Impairments Affecting Rehab Potential  dementia (may need to instruct wife in HEP for carryover)    PT Frequency  1x / week    PT Duration  4 weeks    PT Treatment/Interventions  ADLs/Self Care Home Management;Canalith Repostioning;DME Instruction;Gait training;Stair training;Functional mobility training;Therapeutic activities;Therapeutic exercise;Balance training;Neuromuscular re-education;Cognitive remediation;Patient/family education;Passive range of motion;Vestibular;Visual/perceptual remediation/compensation    PT Next Visit Plan  check remaining LTGs, review HEP to finalize and provide handout of his data compared to normative data    Consulted and Agree with Plan of Care  Patient       Patient will benefit from skilled therapeutic intervention in order to improve the following deficits and impairments:   Abnormal gait, Decreased balance, Decreased knowledge of use of DME, Decreased mobility, Decreased safety awareness, Difficulty walking, Dizziness, Impaired perceived functional ability, Impaired sensation, Postural dysfunction  Visit Diagnosis: Unsteadiness on feet - Plan: PT plan of care cert/re-cert  Other abnormalities of gait and mobility - Plan: PT plan of care cert/re-cert   G-Codes - 21/19/41 1650    Functional Assessment Tool Used (Outpatient Only)  gait velocity 2.97 ft/sec (norm for 40-81 yo male 3.08 ft/sec)    Functional Limitation  Mobility: Walking and moving around    Mobility: Walking and Moving Around Current Status [phone])  At least 1 percent but less than 20 percent impaired, limited or restricted    Mobility: Walking and Moving Around Goal Status [phone])  At least 1 percent but less than 20 percent impaired, limited or restricted       Problem List Patient Active Problem List   Diagnosis Date Noted  . Non compliance w medication regimen 08/23/2016  . Peripheral neuropathy 08/23/2016  . COPD (chronic obstructive pulmonary disease) ([place]) 04/19/2016  . Hyperglycemia 02/10/2016  . Preventative health care 07/29/2015  . CAP (community acquired pneumonia) 05/30/2015  . Abnormal CXR 05/30/2015  . Cough 04/22/2015  . Asthma with exacerbation 04/22/2015  . Urine abnormality 04/22/2015  . Abdominal pain 09/11/2014  . Abnormal liver function test 09/11/2014  . Dysphagia 07/29/2014  . Anxiety state 09/06/2013  . CKD (chronic kidney disease) 09/06/2013  . Hypothyroidism 09/06/2013  . Rash and nonspecific skin eruption 02/27/2013  . Dizziness and giddiness 01/04/2013  . Abnormal TSH 06/04/2012  . Gross hematuria 01/09/2012  . Bruising 06/05/2011  . Dementia 09/01/2010  . Asthma 01/14/2010  . ERECTILE DYSFUNCTION, ORGANIC 04/17/2009  . FATIGUE 06/27/2008  . PSA, INCREASED 06/27/2008  . SCIATICA, RIGHT 04/28/2008  . SCHATZKI'S RING 07/18/2007  . HIATAL HERNIA  07/18/2007  . ABNORMAL ELECTROCARDIOGRAM 06/21/2007  . Hyperlipidemia 03/23/2007  . Essential hypertension 03/23/2007  . ALLERGIC RHINITIS 03/23/2007  . GERD 03/23/2007  . BENIGN PROSTATIC HYPERTROPHY 03/23/2007  . NEPHROLITHIASIS, HX OF 03/23/2007   Physical Therapy Progress Note  Dates of Reporting Period: 01/10/17 to 02/14/17  Objective Reports of Subjective Statement: Patient reports his dizziness is not a problem now and feels his balance has really improved thanks to the PT.  Objective Measurements: see above  Goal Update: NA, continue with current LTGs  Plan: Scheduled for one final visit, which required re-certification  Reason Skilled Services are Required: to finalize his HEP; pt had missed his 4th appt and had to re-schedule   Rexanne Mano, PT 02/14/2017, 4:55 PM  [facility] [address] [place], [place], [address] Phone: [phone]   Fax:  [phone]  Name: ZYMIERE TROSTLE MRN: [id number] Date of Birth: 16-Nov-1927

## 2017-02-28 ENCOUNTER — Other Ambulatory Visit (INDEPENDENT_AMBULATORY_CARE_PROVIDER_SITE_OTHER): Payer: PPO

## 2017-02-28 ENCOUNTER — Ambulatory Visit: Payer: PPO | Admitting: Internal Medicine

## 2017-02-28 ENCOUNTER — Other Ambulatory Visit: Payer: Self-pay | Admitting: Internal Medicine

## 2017-02-28 ENCOUNTER — Encounter: Payer: Self-pay | Admitting: Internal Medicine

## 2017-02-28 VITALS — BP 132/84 | HR 83 | Temp 97.8°F | Ht 72.0 in | Wt 204.0 lb

## 2017-02-28 DIAGNOSIS — Z0000 Encounter for general adult medical examination without abnormal findings: Secondary | ICD-10-CM | POA: Diagnosis not present

## 2017-02-28 DIAGNOSIS — Z23 Encounter for immunization: Secondary | ICD-10-CM | POA: Diagnosis not present

## 2017-02-28 DIAGNOSIS — R739 Hyperglycemia, unspecified: Secondary | ICD-10-CM

## 2017-02-28 DIAGNOSIS — I10 Essential (primary) hypertension: Secondary | ICD-10-CM

## 2017-02-28 DIAGNOSIS — N189 Chronic kidney disease, unspecified: Secondary | ICD-10-CM

## 2017-02-28 DIAGNOSIS — Z9114 Patient's other noncompliance with medication regimen: Secondary | ICD-10-CM | POA: Diagnosis not present

## 2017-02-28 DIAGNOSIS — E890 Postprocedural hypothyroidism: Secondary | ICD-10-CM

## 2017-02-28 DIAGNOSIS — E785 Hyperlipidemia, unspecified: Secondary | ICD-10-CM | POA: Diagnosis not present

## 2017-02-28 DIAGNOSIS — N179 Acute kidney failure, unspecified: Secondary | ICD-10-CM

## 2017-02-28 LAB — HEPATIC FUNCTION PANEL
ALK PHOS: 75 U/L (ref 39–117)
ALT: 9 U/L (ref 0–53)
AST: 16 U/L (ref 0–37)
Albumin: 4 g/dL (ref 3.5–5.2)
BILIRUBIN DIRECT: 0.1 mg/dL (ref 0.0–0.3)
BILIRUBIN TOTAL: 0.5 mg/dL (ref 0.2–1.2)
Total Protein: 6.5 g/dL (ref 6.0–8.3)

## 2017-02-28 LAB — LIPID PANEL
CHOL/HDL RATIO: 5
Cholesterol: 203 mg/dL — ABNORMAL HIGH (ref 0–200)
HDL: 39.8 mg/dL (ref >39.00)
NONHDL: 163.36
TRIGLYCERIDES: 246 mg/dL — AB (ref 0.0–149.0)
VLDL: 49.2 mg/dL — ABNORMAL HIGH (ref 0.0–40.0)

## 2017-02-28 LAB — BASIC METABOLIC PANEL
BUN: 34 mg/dL — ABNORMAL HIGH (ref 6–23)
CALCIUM: 9.1 mg/dL (ref 8.4–10.5)
CHLORIDE: 104 meq/L (ref 96–112)
CO2: 28 mEq/L (ref 19–32)
CREATININE: 2.38 mg/dL — AB (ref 0.40–1.50)
GFR: 27.62 mL/min — AB (ref >60.00)
Glucose, Bld: 133 mg/dL — ABNORMAL HIGH (ref 70–99)
Potassium: 4.7 mEq/L (ref 3.5–5.1)
SODIUM: 140 meq/L (ref 135–145)

## 2017-02-28 LAB — TSH: TSH: 6.86 u[IU]/mL — ABNORMAL HIGH (ref 0.35–4.50)

## 2017-02-28 LAB — LDL CHOLESTEROL, DIRECT: LDL DIRECT: 132 mg/dL

## 2017-02-28 MED ORDER — BECLOMETHASONE DIPROPIONATE 80 MCG/ACT IN AERS: 1.0000 | INHALATION_SPRAY | Freq: Two times a day (BID) | RESPIRATORY_TRACT | 12 refills | Status: DC

## 2017-02-28 MED ORDER — BECLOMETHASONE DIPROP HFA 80 MCG/ACT IN AERB: 2.0000 | INHALATION_SPRAY | Freq: Two times a day (BID) | RESPIRATORY_TRACT | 3 refills | Status: DC

## 2017-02-28 NOTE — Assessment & Plan Note (Signed)
stable overall by history and exam, recent data reviewed with pt, and pt to continue medical treatment as before,  to f/u any worsening symptoms or concerns BP Readings from Last 3 Encounters:  02/28/17 132/84  12/01/16 (!) 163/87  11/08/16 124/76

## 2017-02-28 NOTE — Progress Notes (Signed)
Subjective:    Patient ID: Stephen Wilkins, male    DOB: 25-May-1927, 83 y.o.   MRN: [id number]  HPI  Here to f/u; overall doing ok,  Pt denies chest pain, increasing sob or doe, wheezing, orthopnea, PND, increased LE swelling, palpitations, dizziness or syncope.  Pt denies new neurological symptoms such as new headache, or facial or extremity weakness or numbness.  Pt denies polydipsia, polyuria, or low sugar episode.  Pt states overall good compliance with meds, mostly trying to follow appropriate diet, with wt overall stable.  Has long hx of medication non compliance and self adjustment, and today again mentions he does not take multiple of his meds as rx.  Appears particularly confused about valsartan and losartan, and is actually taking both, whereas he does not take the thyroid, statin, or inhalers on regular basis.  Denies hyper or hypo thyroid symptoms such as voice, skin or hair change. Past Medical History:  Diagnosis Date  . ABNORMAL ELECTROCARDIOGRAM 06/21/2007  . ALLERGIC RHINITIS 03/23/2007  . Anxiety state, unspecified 09/06/2013  . ASTHMATIC BRONCHITIS, ACUTE 04/27/2007  . BENIGN PROSTATIC HYPERTROPHY 03/23/2007  . BRONCHITIS NOT SPECIFIED AS ACUTE OR CHRONIC 04/21/2007  . BURSITIS, RIGHT HIP 07/31/2008  . COPD (chronic obstructive pulmonary disease) ([place]) 04/19/2016  . Cough 01/29/2009  . Dementia 09/01/2010  . ERECTILE DYSFUNCTION 03/23/2007  . ERECTILE DYSFUNCTION, ORGANIC 04/17/2009  . Esophageal stricture   . FREQUENCY, URINARY 04/26/2010  . GERD 03/23/2007  . HIATAL HERNIA   . HYPERLIPIDEMIA 03/23/2007  . HYPERTENSION 03/23/2007  . MILD COGNITIVE IMPAIRMENT SO STATED 05/19/2010  . NEPHROLITHIASIS, HX OF 03/23/2007  . PSA, INCREASED 06/27/2008  . RASH-NONVESICULAR 03/23/2007  . REACTIVE AIRWAY DISEASE 01/14/2010  . SCHATZKI'S RING   . SCIATICA, RIGHT 04/28/2008  . Unspecified hypothyroidism 09/06/2013  . Wheezing 04/17/2009   Past Surgical History:  Procedure Laterality Date    . TONSILLECTOMY      reports that he has quit smoking. he has never used smokeless tobacco. He reports that he does not drink alcohol or use drugs. family history includes Cancer in his brother and unknown relative; Emphysema in his brother; Hypertension in his unknown relative. Allergies  Allergen Reactions  . Atorvastatin     REACTION: myalgia  . Doxazosin Mesylate     REACTION: dizziness  . Hydrocodone-Homatropine Other (See Comments)    anxiety   Current Outpatient Medications on File Prior to Visit  Medication Sig Dispense Refill  . albuterol (PROVENTIL HFA;VENTOLIN HFA) 108 (90 Base) MCG/ACT inhaler Inhale 2 puffs into the lungs every 6 (six) hours as needed for wheezing. 1 Inhaler 11  . ALPRAZolam (XANAX) 0.5 MG tablet Take 1 tablet (0.5 mg total) by mouth daily as needed for anxiety. 30 tablet 2  . amitriptyline (ELAVIL) 50 MG tablet Take 1 tablet (50 mg total) by mouth at bedtime as needed for sleep. 90 tablet 1  . aspirin EC 81 MG tablet Take 1 tablet (81 mg total) by mouth daily. 90 tablet 11  . Azelastine HCl 0.15 % SOLN Place 2 sprays into both nostrils 2 (two) times daily. 30 mL 0  . cephALEXin (KEFLEX) 500 MG capsule Take 1 capsule (500 mg total) by mouth 2 (two) times daily. 14 capsule 0  . Cholecalciferol (VITAMIN D) 1000 UNITS capsule Take 1,000 Units by mouth daily.      . cyanocobalamin 1000 MCG tablet Take 100 mcg by mouth daily.    Marland Kitchen desoximetasone (TOPICORT) 0.25 % cream apply sparingly to affected  area twice a day as directed if needed 30 g 1  . lansoprazole (PREVACID) 30 MG capsule Take 1 capsule (30 mg total) by mouth daily. 90 capsule 3  . levothyroxine (SYNTHROID, LEVOTHROID) 112 MCG tablet Take 1 tablet (112 mcg total) by mouth daily. 90 tablet 3  . losartan (COZAAR) 100 MG tablet Take 1 tablet (100 mg total) by mouth daily. 90 tablet 3  . Multiple Vitamins-Minerals (CENTRUM SILVER PO) Take by mouth daily.      . polyethylene glycol powder (MIRALAX) powder  Take 1/2 capful by mouth once daily 850 g 5  . rosuvastatin (CRESTOR) 10 MG tablet Take 1 tablet (10 mg total) by mouth daily. 90 tablet 3  . tamsulosin (FLOMAX) 0.4 MG CAPS capsule take 1 capsule by mouth at bedtime 90 capsule 0  . vitamin C (ASCORBIC ACID) 500 MG tablet Take 500 mg by mouth daily.     No current facility-administered medications on file prior to visit.    Review of Systems  Constitutional: Negative for other unusual diaphoresis or sweats HENT: Negative for ear discharge or swelling Eyes: Negative for other worsening visual disturbances Respiratory: Negative for stridor or other swelling  Gastrointestinal: Negative for worsening distension or other blood Genitourinary: Negative for retention or other urinary change Musculoskeletal: Negative for other MSK pain or swelling Skin: Negative for color change or other new lesions Neurological: Negative for worsening tremors and other numbness  Psychiatric/Behavioral: Negative for worsening agitation or other fatigue All other system neg per pt    Objective:   Physical Exam BP 132/84   Pulse 83   Temp 97.8 F (36.6 C) (Oral)   Ht 6' (1.829 m)   Wt 204 lb (92.5 kg)   SpO2 98%   BMI 27.67 kg/m   VS noted,  Constitutional: Pt is oriented to person, place, and time. Appears well-developed and well-nourished, in no significant distress and comfortable Head: Normocephalic and atraumatic  Eyes: Conjunctivae and EOM are normal. Pupils are equal, round, and reactive to light Right Ear: External ear normal without discharge Left Ear: External ear normal without discharge Nose: Nose without discharge or deformity Mouth/Throat: Oropharynx is without other ulcerations and moist  Neck: Normal range of motion. Neck supple. No JVD present. No tracheal deviation present or significant neck LA or mass Cardiovascular: Normal rate, regular rhythm, normal heart sounds and intact distal pulses.   Pulmonary/Chest: WOB normal and breath  sounds without rales or wheezing  Abdominal: Soft. Bowel sounds are normal. NT. No HSM  Musculoskeletal: Normal range of motion. Exhibits no edema Lymphadenopathy: Has no other cervical adenopathy.  Neurological: Pt is alert and oriented to person, place, and time. Pt has normal reflexes. No cranial nerve deficit. Motor grossly intact, Gait intact Skin: Skin is warm and dry. No rash noted or new ulcerations Psychiatric:  Has normal mood and affect. Behavior is normal without agitation No other exam findings       Assessment & Plan:

## 2017-02-28 NOTE — Assessment & Plan Note (Addendum)
stable overall by history and exam, recent data reviewed with pt, and pt to continue medical treatment as before,  to f/u any worsening symptoms or concerns,. For f/u lab today  Note:  Total time for pt hx, exam, review of record with pt in the room, determination of diagnoses and plan for further eval and tx is > 40 min, with over 50% spent in coordination and counseling of patient, including the differential dx, tx, further evaluation and other management of CKD, HLD, hyperglycemia, HTN, and medication non compliance

## 2017-02-28 NOTE — Assessment & Plan Note (Signed)
With recent noncompliance some days, for f/u lab today

## 2017-02-28 NOTE — Assessment & Plan Note (Signed)
stable overall by history and exam, recent data reviewed with pt, and pt to continue medical treatment as before,  to f/u any worsening symptoms or concerns, for f/u lab today Lab Results  Component Value Date   HGBA1C 6.1 07/21/2016

## 2017-02-28 NOTE — Patient Instructions (Addendum)
You had the flu shot today  Remember to stop the Valsartan due to the recall of the medication  OK to take the losartan 100 mg per day  OK to try the Skin moisturizer - Eucerin cream to the legs  Please restart the cholesterol medication  Please take the thyroid medicaiton every day instead of every few days  Please take the Qvar twice per day instead of once per pday  Please continue all other medications as before, and refills have been done if requested.  Please have the pharmacy call with any other refills you may need.  Please keep your appointments with your specialists as you may have planned  Please go to the LAB in the Basement (turn left off the elevator) for the tests to be done today  You will be contacted by phone if any changes need to be made immediately.  Otherwise, you will receive a letter about your results with an explanation, but please check with MyChart first.  Please remember to sign up for MyChart if you have not done so, as this will be important to you in the future with finding out test results, communicating by private email, and scheduling acute appointments online when needed.  Please return in 6 months, or sooner if needed, with Lab testing done 3-5 days before

## 2017-02-28 NOTE — Assessment & Plan Note (Signed)
D./w pt again importance of compliance with meds as rx

## 2017-03-01 ENCOUNTER — Telehealth: Payer: Self-pay

## 2017-03-01 NOTE — Telephone Encounter (Signed)
-----   Message from Biagio Borg, MD sent at 02/28/2017  5:16 PM EST ----- Left message on MyChart, pt to cont same tx except  The test results show that your current treatment is OK, except the kidney function has slowed quite a bit.  This may be due to taking the Valsartan and the Losartan together as you mentioned at your visit.  Please only take the losartan 100 mg, and return for LAB only 1 week.  Please also to try to drink more fluids over the next few days.Redmond Baseman to please inform pt, I will do order, and remember we may need to speak to his wife due to pt's dementia

## 2017-03-01 NOTE — Telephone Encounter (Signed)
Called pt, LVM  CRM done.  

## 2017-03-06 ENCOUNTER — Encounter: Payer: Self-pay | Admitting: Physical Therapy

## 2017-03-06 NOTE — Therapy (Signed)
[facility] [address] [place], [place], [address] Phone: [phone]   Fax:  [phone]  Patient Details  Name: Stephen Wilkins MRN: [id number] Date of Birth: [birth year] Referring Provider:  Cathlean Cower MD  Encounter Date: 03/06/2017 PHYSICAL THERAPY DISCHARGE SUMMARY  Visits from Start of Care: 4  Current functional level related to goals / functional outcomes: Unable to assess as did not return to clinic for last visit   Remaining deficits: Unable to assess as did not return to clinic for last visit    Education / Equipment: HEP  Plan: Patient agrees to discharge.  Patient goals were not met. Patient is being discharged due to not returning since the last visit.  ?????       Rexanne Mano, PT 03/06/2017, 5:20 PM  [facility] [address] [place], [place], [address] Phone: [phone]   Fax:  [phone]

## 2017-03-14 ENCOUNTER — Ambulatory Visit: Payer: PPO | Admitting: Physical Therapy

## 2017-03-22 ENCOUNTER — Encounter: Payer: Self-pay | Admitting: Physical Therapy

## 2017-03-31 ENCOUNTER — Other Ambulatory Visit: Payer: Self-pay | Admitting: Internal Medicine

## 2017-04-12 NOTE — Progress Notes (Addendum)
Subjective:   Stephen Wilkins is a 84 y.o. male who presents for an Initial Medicare Annual Wellness Visit.  Patient states he is off balanced and weak, concerned about BLE discoloration. Reports wheezing is not controlled with prescribed medications.  Patient did not have wheezing, SOB or breathing difficulty during AWV with  o2 saturation 97% on RA.  Review of Systems  No ROS.  Medicare Wellness Visit. Additional risk factors are reflected in the social history.  Cardiac Risk Factors include: advanced age (>38men, >28 women);dyslipidemia;hypertension;male gender Sleep patterns: has restless sleep, gets up 1-2 times nightly to void and sleeps 5-6 hours nightly. Patient reports insomnia issues, discussed recommended sleep tips and stress reduction tips.   Home Safety/Smoke Alarms: Feels safe in home. Smoke alarms in place.  Living environment; residence and Firearm Safety: 1-story house/ trailer, no firearms. Lives with wife, no needs for DME, limited support system Seat Belt Safety/Bike Helmet: Wears seat belt.    Objective:    Today's Vitals   04/13/17 1548  BP: (!) 144/86  Pulse: 81  Resp: 20  SpO2: 97%  Weight: 204 lb (92.5 kg)  Height: 6' (1.829 m)   Body mass index is 27.67 kg/m.  Advanced Directives 04/13/2017 01/10/2017 12/01/2016  Does Patient Have a Medical Advance Directive? Yes Yes No  Type of Paramedic of [place];Living will [place];Living will -  Copy of [place] in Chart? No - copy requested - -    Current Medications (verified) Outpatient Encounter Medications as of 04/13/2017  Medication Sig  . albuterol (PROVENTIL HFA;VENTOLIN HFA) 108 (90 Base) MCG/ACT inhaler Inhale 2 puffs into the lungs every 6 (six) hours as needed for wheezing.  Marland Kitchen ALPRAZolam (XANAX) 0.5 MG tablet Take 1 tablet (0.5 mg total) by mouth daily as needed for anxiety.  Marland Kitchen aspirin EC 81 MG tablet Take 1 tablet (81 mg total) by  mouth daily.  . Azelastine HCl 0.15 % SOLN Place 2 sprays into both nostrils 2 (two) times daily.  . beclomethasone (QVAR REDIHALER) 80 MCG/ACT inhaler Inhale 2 puffs into the lungs 2 (two) times daily.  . Cholecalciferol (VITAMIN D) 1000 UNITS capsule Take 1,000 Units by mouth daily.    . cyanocobalamin 1000 MCG tablet Take 100 mcg by mouth daily.  Marland Kitchen desoximetasone (TOPICORT) 0.25 % cream apply sparingly to affected area twice a day as directed if needed  . lansoprazole (PREVACID) 30 MG capsule Take 1 capsule (30 mg total) by mouth daily.  Marland Kitchen levothyroxine (SYNTHROID, LEVOTHROID) 112 MCG tablet Take 1 tablet (112 mcg total) by mouth daily.  Marland Kitchen losartan (COZAAR) 100 MG tablet Take 1 tablet (100 mg total) by mouth daily.  . Multiple Vitamins-Minerals (CENTRUM SILVER PO) Take by mouth daily.    . polyethylene glycol powder (MIRALAX) powder Take 1/2 capful by mouth once daily  . rosuvastatin (CRESTOR) 10 MG tablet Take 1 tablet (10 mg total) by mouth daily.  . tamsulosin (FLOMAX) 0.4 MG CAPS capsule take 1 capsule by mouth at bedtime  . vitamin C (ASCORBIC ACID) 500 MG tablet Take 500 mg by mouth daily.  . SYMBICORT 160-4.5 MCG/ACT inhaler inhale 2 puffs INTO THE LUNGS twice a day (Patient not taking: Reported on 04/13/2017)  . [DISCONTINUED] amitriptyline (ELAVIL) 50 MG tablet Take 1 tablet (50 mg total) by mouth at bedtime as needed for sleep. (Patient not taking: Reported on 04/13/2017)  . [DISCONTINUED] cephALEXin (KEFLEX) 500 MG capsule Take 1 capsule (500 mg total)  by mouth 2 (two) times daily. (Patient not taking: Reported on 04/13/2017)   No facility-administered encounter medications on file as of 04/13/2017.     Allergies (verified) Atorvastatin; Doxazosin mesylate; and Hydrocodone-homatropine   History: Past Medical History:  Diagnosis Date  . ABNORMAL ELECTROCARDIOGRAM 06/21/2007  . ALLERGIC RHINITIS 03/23/2007  . Anxiety state, unspecified 09/06/2013  . ASTHMATIC BRONCHITIS, ACUTE  04/27/2007  . BENIGN PROSTATIC HYPERTROPHY 03/23/2007  . BRONCHITIS NOT SPECIFIED AS ACUTE OR CHRONIC 04/21/2007  . BURSITIS, RIGHT HIP 07/31/2008  . COPD (chronic obstructive pulmonary disease) ([place]) 04/19/2016  . Cough 01/29/2009  . Dementia 09/01/2010  . ERECTILE DYSFUNCTION 03/23/2007  . ERECTILE DYSFUNCTION, ORGANIC 04/17/2009  . Esophageal stricture   . FREQUENCY, URINARY 04/26/2010  . GERD 03/23/2007  . HIATAL HERNIA   . HYPERLIPIDEMIA 03/23/2007  . HYPERTENSION 03/23/2007  . MILD COGNITIVE IMPAIRMENT SO STATED 05/19/2010  . NEPHROLITHIASIS, HX OF 03/23/2007  . PSA, INCREASED 06/27/2008  . RASH-NONVESICULAR 03/23/2007  . REACTIVE AIRWAY DISEASE 01/14/2010  . SCHATZKI'S RING   . SCIATICA, RIGHT 04/28/2008  . Unspecified hypothyroidism 09/06/2013  . Wheezing 04/17/2009   Past Surgical History:  Procedure Laterality Date  . TONSILLECTOMY     Family History  Problem Relation Age of Onset  . Cancer Brother   . Cancer Unknown        lung cancer  . Hypertension Unknown   . Emphysema Brother    Social History   Socioeconomic History  . Marital status: Married    Spouse name: None  . Number of children: None  . Years of education: None  . Highest education level: None  Social Needs  . Financial resource strain: Not hard at all  . Food insecurity - worry: Never true  . Food insecurity - inability: Never true  . Transportation needs - medical: No  . Transportation needs - non-medical: No  Occupational History  . 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    Employer: RETIRED  Tobacco Use  . Smoking status: Former Research scientist (life sciences)  . Smokeless tobacco: Never Used  Substance and Sexual Activity  . Alcohol use: No  . Drug use: No  . Sexual activity: No  Other Topics Concern  . None  Social History Narrative  . None   Tobacco Counseling Counseling given: Not Answered  Activities of Daily Living In your present state of health, do you have any  difficulty performing the following activities: 04/13/2017  Hearing? Y  Vision? N  Difficulty concentrating or making decisions? N  Walking or climbing stairs? Y  Dressing or bathing? N  Doing errands, shopping? N  Preparing Food and eating ? N  Using the Toilet? N  In the past six months, have you accidently leaked urine? N  Do you have problems with loss of bowel control? N  Managing your Medications? N  Managing your Finances? N  Housekeeping or managing your Housekeeping? N  Some recent data might be hidden     Immunizations and Health Maintenance Immunization History  Administered Date(s) Administered  . Influenza Split 01/09/2012  . Influenza Whole 03/23/2007, 02/02/2009, 01/13/2010  . Influenza, High Dose Seasonal PF 02/07/2014, 01/28/2015, 02/28/2017  . Influenza,inj,Quad PF,6+ Mos 12/05/2012  . Pneumococcal Conjugate-13 06/05/2013  . Pneumococcal Polysaccharide-23 03/23/2007  . Td 07/31/2008   There are no preventive care reminders to display for this patient.  Patient Care Team: Biagio Borg, MD as PCP - General  Indicate any recent Medical Services you  may have received from other than [facility] providers in the past year (date may be approximate).    Assessment:   This is a routine wellness examination for Stephen Wilkins. Physical assessment deferred to PCP.   Hearing/Vision screen Hearing Screening Comments: HOH, has hearing aids Vision Screening Comments: appointment yearly Dr Leory Plowman  Dietary issues and exercise activities discussed: Current Exercise Habits: Structured exercise class, Type of exercise: treadmill, Time (Minutes): 40, Intensity: Mild    Goals    . Patient Stated     Stay as active and as independent as possible. Continue to go to the gym several times per week.      Depression Screen PHQ 2/9 Scores 04/13/2017 04/01/2016 07/29/2015 04/30/2015  PHQ - 2 Score 0 0 0 0  PHQ- 9 Score 4 - - -    Fall Risk Fall Risk  04/13/2017 04/01/2016 07/29/2015  04/30/2015 03/12/2014  Falls in the past year? Yes No No No No  Number falls in past yr: 1 - - - -  Injury with Fall? Yes - - - -  Risk for fall due to : Impaired mobility;Impaired balance/gait - - - -  Follow up Falls prevention discussed;Education provided - - - -   Cognitive Function: MMSE - Mini Mental State Exam 04/13/2017  Orientation to time 5  Orientation to Place 5  Registration 3  Attention/ Calculation 5  Recall 1  Language- name 2 objects 2  Language- repeat 1  Language- follow 3 step command 3  Language- read & follow direction 1  Write a sentence 1  Copy design 1  Total score 28        Screening Tests Health Maintenance  Topic Date Due  . TETANUS/TDAP  08/01/2018  . INFLUENZA VACCINE  Completed  . PNA vac Low Risk Adult  Completed      Plan:    PCP appointment scheduled for 04/14/17 to evaluate patient's off balanced gait, BLE discoloration, and wheezing not controlled by medication.    Continue doing brain stimulating activities (puzzles, reading, adult coloring books, staying active) to keep memory sharp.   Continue to eat heart healthy diet (full of fruits, vegetables, whole grains, lean protein, water--limit salt, fat, and sugar intake) and increase physical activity as tolerated.   I have personally reviewed and noted the following in the patient's chart:   . Medical and social history . Use of alcohol, tobacco or illicit drugs  . Current medications and supplements . Functional ability and status . Nutritional status . Physical activity . Advanced directives . List of other physicians . Vitals . Screenings to include cognitive, depression, and falls . Referrals and appointments  In addition, I have reviewed and discussed with patient certain preventive protocols, quality metrics, and best practice recommendations. A written personalized care plan for preventive services as well as general preventive health recommendations were provided to  patient.     Michiel Cowboy, RN   04/13/2017   Medical screening examination/treatment/procedure(s) were performed by non-physician practitioner and as supervising physician I was immediately available for consultation/collaboration. I agree with above. Cathlean Cower, MD

## 2017-04-13 ENCOUNTER — Ambulatory Visit (INDEPENDENT_AMBULATORY_CARE_PROVIDER_SITE_OTHER): Payer: PPO | Admitting: *Deleted

## 2017-04-13 VITALS — BP 144/86 | HR 81 | Resp 20 | Ht 72.0 in | Wt 204.0 lb

## 2017-04-13 DIAGNOSIS — Z0000 Encounter for general adult medical examination without abnormal findings: Secondary | ICD-10-CM

## 2017-04-13 NOTE — Patient Instructions (Addendum)
Shower suction bar [employer] or Stephen Wilkins  Continue doing brain stimulating activities (puzzles, reading, adult coloring books, staying active) to keep memory sharp.   Continue to eat heart healthy diet (full of fruits, vegetables, whole grains, lean protein, water--limit salt, fat, and sugar intake) and increase physical activity as tolerated.   Stephen Wilkins , Thank you for taking time to come for your Medicare Wellness Visit. I appreciate your ongoing commitment to your health goals. Please review the following plan we discussed and let me know if I can assist you in the future.   These are the goals we discussed: Goals    . Patient Stated     Stay as active and as independent as possible. Continue to go to the gym.       This is a list of the screening recommended for you and due dates:  Health Maintenance  Topic Date Due  . Tetanus Vaccine  08/01/2018  . Flu Shot  Completed  . Pneumonia vaccines  Completed

## 2017-04-14 ENCOUNTER — Ambulatory Visit (INDEPENDENT_AMBULATORY_CARE_PROVIDER_SITE_OTHER): Payer: PPO | Admitting: Internal Medicine

## 2017-04-14 ENCOUNTER — Encounter: Payer: Self-pay | Admitting: Internal Medicine

## 2017-04-14 ENCOUNTER — Other Ambulatory Visit (INDEPENDENT_AMBULATORY_CARE_PROVIDER_SITE_OTHER): Payer: PPO

## 2017-04-14 VITALS — BP 144/90 | HR 89 | Temp 97.7°F | Ht 72.0 in | Wt 204.0 lb

## 2017-04-14 DIAGNOSIS — J4541 Moderate persistent asthma with (acute) exacerbation: Secondary | ICD-10-CM | POA: Diagnosis not present

## 2017-04-14 DIAGNOSIS — N189 Chronic kidney disease, unspecified: Secondary | ICD-10-CM

## 2017-04-14 DIAGNOSIS — R739 Hyperglycemia, unspecified: Secondary | ICD-10-CM | POA: Diagnosis not present

## 2017-04-14 DIAGNOSIS — E890 Postprocedural hypothyroidism: Secondary | ICD-10-CM

## 2017-04-14 DIAGNOSIS — I10 Essential (primary) hypertension: Secondary | ICD-10-CM | POA: Diagnosis not present

## 2017-04-14 LAB — BASIC METABOLIC PANEL
BUN: 38 mg/dL — ABNORMAL HIGH (ref 6–23)
CO2: 33 meq/L — AB (ref 19–32)
Calcium: 9.3 mg/dL (ref 8.4–10.5)
Chloride: 102 mEq/L (ref 96–112)
Creatinine, Ser: 2.32 mg/dL — ABNORMAL HIGH (ref 0.40–1.50)
GFR: 28.44 mL/min — AB (ref >60.00)
GLUCOSE: 133 mg/dL — AB (ref 70–99)
POTASSIUM: 4.2 meq/L (ref 3.5–5.1)
SODIUM: 142 meq/L (ref 135–145)

## 2017-04-14 LAB — HEMOGLOBIN A1C: Hgb A1c MFr Bld: 6 % (ref 4.6–6.5)

## 2017-04-14 LAB — TSH: TSH: 8.19 u[IU]/mL — AB (ref 0.35–4.50)

## 2017-04-14 MED ORDER — MOMETASONE FURO-FORMOTEROL FUM 200-5 MCG/ACT IN AERO: 2.0000 | INHALATION_SPRAY | Freq: Two times a day (BID) | RESPIRATORY_TRACT | 11 refills | Status: DC

## 2017-04-14 MED ORDER — METHYLPREDNISOLONE ACETATE 80 MG/ML IJ SUSP
80.0000 mg | Freq: Once | INTRAMUSCULAR | Status: AC
  Administered 2017-04-14: 80 mg via INTRAMUSCULAR

## 2017-04-14 MED ORDER — PREDNISONE 10 MG PO TABS: ORAL_TABLET | ORAL | 0 refills | Status: DC

## 2017-04-14 NOTE — Progress Notes (Signed)
Subjective:    Patient ID: Stephen Wilkins, male    DOB: 1927-07-03, 84 y.o.   MRN: [id number]  HPI 84yo M with hx of COPD, asthma, dementia and noncompliance with med tx; here with 3-4 days worsening sob and wheezing, had stopped the symbicort a few wks ago it seems as he came to believe now symbicort makes his wheezing worse.   Pt denies fever, wt loss, night sweats, loss of appetite, or other constitutional symptoms  In the last day using proair every 2 hrs but just not staying improved, wheezing keeps coming back.  Asks for steroid tx that has improved several times recently. Pt denies chest pain, orthopnea, PND, increased LE swelling, palpitations, dizziness or syncope. Denies hyper or hypo thyroid symptoms such as voice, skin or hair change.  Has known hx of CKD for f/u as well.   Pt denies polydipsia, polyuria, Dementia overall stable symptomatically per wife with him, and not assoc with behavioral changes such as hallucinations, paranoia, or agitation. Past Medical History:  Diagnosis Date  . ABNORMAL ELECTROCARDIOGRAM 06/21/2007  . ALLERGIC RHINITIS 03/23/2007  . Anxiety state, unspecified 09/06/2013  . ASTHMATIC BRONCHITIS, ACUTE 04/27/2007  . BENIGN PROSTATIC HYPERTROPHY 03/23/2007  . BRONCHITIS NOT SPECIFIED AS ACUTE OR CHRONIC 04/21/2007  . BURSITIS, RIGHT HIP 07/31/2008  . COPD (chronic obstructive pulmonary disease) ([place]) 04/19/2016  . Cough 01/29/2009  . Dementia 09/01/2010  . ERECTILE DYSFUNCTION 03/23/2007  . ERECTILE DYSFUNCTION, ORGANIC 04/17/2009  . Esophageal stricture   . FREQUENCY, URINARY 04/26/2010  . GERD 03/23/2007  . HIATAL HERNIA   . HYPERLIPIDEMIA 03/23/2007  . HYPERTENSION 03/23/2007  . MILD COGNITIVE IMPAIRMENT SO STATED 05/19/2010  . NEPHROLITHIASIS, HX OF 03/23/2007  . PSA, INCREASED 06/27/2008  . RASH-NONVESICULAR 03/23/2007  . REACTIVE AIRWAY DISEASE 01/14/2010  . SCHATZKI'S RING   . SCIATICA, RIGHT 04/28/2008  . Unspecified hypothyroidism 09/06/2013  . Wheezing  04/17/2009   Past Surgical History:  Procedure Laterality Date  . TONSILLECTOMY      reports that he has quit smoking. he has never used smokeless tobacco. He reports that he does not drink alcohol or use drugs. family history includes Cancer in his brother and unknown relative; Emphysema in his brother; Hypertension in his unknown relative. Allergies  Allergen Reactions  . Atorvastatin     REACTION: myalgia  . Doxazosin Mesylate     REACTION: dizziness  . Hydrocodone-Homatropine Other (See Comments)    anxiety   Current Outpatient Medications on File Prior to Visit  Medication Sig Dispense Refill  . albuterol (PROVENTIL HFA;VENTOLIN HFA) 108 (90 Base) MCG/ACT inhaler Inhale 2 puffs into the lungs every 6 (six) hours as needed for wheezing. 1 Inhaler 11  . ALPRAZolam (XANAX) 0.5 MG tablet Take 1 tablet (0.5 mg total) by mouth daily as needed for anxiety. 30 tablet 2  . aspirin EC 81 MG tablet Take 1 tablet (81 mg total) by mouth daily. 90 tablet 11  . Azelastine HCl 0.15 % SOLN Place 2 sprays into both nostrils 2 (two) times daily. 30 mL 0  . Cholecalciferol (VITAMIN D) 1000 UNITS capsule Take 1,000 Units by mouth daily.      . cyanocobalamin 1000 MCG tablet Take 100 mcg by mouth daily.    Marland Kitchen desoximetasone (TOPICORT) 0.25 % cream apply sparingly to affected area twice a day as directed if needed 30 g 1  . lansoprazole (PREVACID) 30 MG capsule Take 1 capsule (30 mg total) by mouth daily. 90 capsule 3  .  levothyroxine (SYNTHROID, LEVOTHROID) 112 MCG tablet Take 1 tablet (112 mcg total) by mouth daily. 90 tablet 3  . losartan (COZAAR) 100 MG tablet Take 1 tablet (100 mg total) by mouth daily. 90 tablet 3  . Multiple Vitamins-Minerals (CENTRUM SILVER PO) Take by mouth daily.      . polyethylene glycol powder (MIRALAX) powder Take 1/2 capful by mouth once daily 850 g 5  . rosuvastatin (CRESTOR) 10 MG tablet Take 1 tablet (10 mg total) by mouth daily. 90 tablet 3  . SYMBICORT 160-4.5 MCG/ACT  inhaler inhale 2 puffs INTO THE LUNGS twice a day 10.2 g 12  . tamsulosin (FLOMAX) 0.4 MG CAPS capsule take 1 capsule by mouth at bedtime 90 capsule 0  . vitamin C (ASCORBIC ACID) 500 MG tablet Take 500 mg by mouth daily.     No current facility-administered medications on file prior to visit.    Review of Systems  Constitutional: Negative for other unusual diaphoresis or sweats HENT: Negative for ear discharge or swelling Eyes: Negative for other worsening visual disturbances Respiratory: Negative for stridor or other swelling  Gastrointestinal: Negative for worsening distension or other blood Genitourinary: Negative for retention or other urinary change Musculoskeletal: Negative for other MSK pain or swelling Skin: Negative for color change or other new lesions Neurological: Negative for worsening tremors and other numbness  Psychiatric/Behavioral: Negative for worsening agitation or other fatigue All other system neg per pt    Objective:   Physical Exam BP (!) 144/90   Pulse 89   Temp 97.7 F (36.5 C) (Oral)   Ht 6' (1.829 m)   Wt 204 lb (92.5 kg)   SpO2 95%   BMI 27.67 kg/m  VS noted, not ill appearing Constitutional: Pt appears in NAD HENT: Head: NCAT.  Right Ear: External ear normal.  Left Ear: External ear normal.  Eyes: . Pupils are equal, round, and reactive to light. Conjunctivae and EOM are normal Nose: without d/c or deformity Neck: Neck supple. Gross normal ROM Cardiovascular: Normal rate and regular rhythm.   Pulmonary/Chest: Effort normal and breath sounds decreased without rales but with bilat wheezing.  Neurological: Pt is alert. At baseline orientation, motor grossly intact Skin: Skin is warm. No rashes, other new lesions, no LE edema Psychiatric: Pt behavior is normal without agitation  No other exam findings  Lab Results  Component Value Date   WBC 8.2 07/21/2016   HGB 13.6 07/21/2016   HCT 40.8 07/21/2016   PLT 270.0 07/21/2016   GLUCOSE 133 (H)  02/28/2017   CHOL 203 (H) 02/28/2017   TRIG 246.0 (H) 02/28/2017   HDL 39.80 02/28/2017   LDLDIRECT 132.0 02/28/2017   LDLCALC 119 (H) 07/21/2016   ALT 9 02/28/2017   AST 16 02/28/2017   NA 140 02/28/2017   K 4.7 02/28/2017   CL 104 02/28/2017   CREATININE 2.38 (H) 02/28/2017   BUN 34 (H) 02/28/2017   CO2 28 02/28/2017   TSH 6.86 (H) 02/28/2017   PSA 3.94 03/12/2014   HGBA1C 6.1 07/21/2016      Assessment & Plan:

## 2017-04-14 NOTE — Patient Instructions (Addendum)
You had the steroid shot today  Please take all new medication as prescribed - the prednisone, and dulera  OK to stop the Symbicort  Please continue all other medications as before  Please have the pharmacy call with any other refills you may need.  Please keep your appointments with your specialists as you may have planned  You will be contacted regarding the referral for: pulmonary - Dr Melvyn Novas per your request  Please go to the LAB in the Basement (turn left off the elevator) for the tests to be done today  You will be contacted by phone if any changes need to be made immediately.  Otherwise, you will receive a letter about your results with an explanation, but please check with MyChart first.  Please remember to sign up for MyChart if you have not done so, as this will be important to you in the future with finding out test results, communicating by private email, and scheduling acute appointments online when needed.

## 2017-04-15 NOTE — Assessment & Plan Note (Signed)
With mild elevation TSH last visit, asympt, for f/u lab

## 2017-04-15 NOTE — Assessment & Plan Note (Signed)
With mild to mod flare likely due to med noncompliance, he is not wanting symbicort further or qvar as per previous, will cont proair prn but also add dulera asd,  to f/u any worsening symptoms or concerns; also for depomedrol IM 80, and predpac asd,

## 2017-04-15 NOTE — Assessment & Plan Note (Signed)
Mild elevated, likely situational, o/w stable overall by history and exam, recent data reviewed with pt, and pt to continue medical treatment as before,  to f/u any worsening symptoms or concerns BP Readings from Last 3 Encounters:  04/14/17 (!) 144/90  04/13/17 (!) 144/86  02/28/17 132/84

## 2017-04-15 NOTE — Assessment & Plan Note (Signed)
Mild to mod, also for f/u lab,  to f/u any worsening symptoms or concerns

## 2017-04-15 NOTE — Assessment & Plan Note (Signed)
Lab Results  Component Value Date   HGBA1C 6.1 07/21/2016  stable overall by history and exam, recent data reviewed with pt, and pt to continue medical treatment as before,  to f/u any worsening symptoms or concerns

## 2017-04-17 ENCOUNTER — Other Ambulatory Visit: Payer: Self-pay | Admitting: Internal Medicine

## 2017-04-17 ENCOUNTER — Telehealth: Payer: Self-pay

## 2017-04-17 DIAGNOSIS — N183 Chronic kidney disease, stage 3 unspecified: Secondary | ICD-10-CM

## 2017-04-17 LAB — T4, FREE: Free T4: 0.66 ng/dL (ref 0.60–1.60)

## 2017-04-17 MED ORDER — LEVOTHYROXINE SODIUM 125 MCG PO TABS: 125.0000 ug | ORAL_TABLET | Freq: Every day | ORAL | 3 refills | Status: DC

## 2017-04-17 NOTE — Telephone Encounter (Signed)
-----   Message from Biagio Borg, MD sent at 04/17/2017 12:37 PM EST ----- Left message on MyChart, pt to cont same tx except  The test results show that your current treatment is OK, except the thyroid is still somewhat low, and the kidney function is still moderately to severely decreased.  We should increase the thyroid medication to 125 mcg per day, and refer to Nephrology.  You should hear from the office soon.   Zamora Colton to please inform pt, I will do rx and referral

## 2017-04-17 NOTE — Telephone Encounter (Signed)
Called pt, LVM.   CRM created.  

## 2017-05-06 ENCOUNTER — Other Ambulatory Visit: Payer: Self-pay | Admitting: Internal Medicine

## 2017-05-09 ENCOUNTER — Encounter: Payer: Self-pay | Admitting: Internal Medicine

## 2017-05-09 ENCOUNTER — Ambulatory Visit: Payer: PPO | Admitting: Internal Medicine

## 2017-05-09 VITALS — BP 110/72 | HR 96 | Ht 72.0 in | Wt 198.8 lb

## 2017-05-09 DIAGNOSIS — J45991 Cough variant asthma: Secondary | ICD-10-CM | POA: Diagnosis not present

## 2017-05-09 LAB — NITRIC OXIDE: NITRIC OXIDE: 21

## 2017-05-09 MED ORDER — BUDESONIDE-FORMOTEROL FUMARATE 80-4.5 MCG/ACT IN AERO: INHALATION_SPRAY | RESPIRATORY_TRACT | 11 refills | Status: DC

## 2017-05-09 MED ORDER — BUDESONIDE-FORMOTEROL FUMARATE 80-4.5 MCG/ACT IN AERO: 2.0000 | INHALATION_SPRAY | Freq: Two times a day (BID) | RESPIRATORY_TRACT | 0 refills | Status: DC

## 2017-05-09 NOTE — Progress Notes (Signed)
Subjective:     Patient ID: Stephen Wilkins, male   DOB: 1927-11-14,     MRN: [id number]  HPI  45 yowm never smoker with new pattern since around 2009  of "head cold going into chest" lasting up to 6 weeks typically better p prednisone but still left wheezing  at qhs (no delay)  better once asleep and does fine when wakes up in am so  referred to pulmonary clinic 05/09/2017 by Dr   Jenny Reichmann with w/u by Dr Elsworth Soho in 2009-10 c/w mild asthma plus component of uacs/ ? gerd related but no longer on ppi.   05/09/2017 1st [facility] office visit/ Stephen Wilkins   Chief Complaint  Patient presents with  . Pulmonary Consult    Referred by Dr. Cathlean Cower for eval of Asthma.  He states he was dxed with Asthma approx 2 yrs ago. He is bothered by wheezing that occurs mainly at night.  He has occ cough and SOB.  His cough is non prod. He is currently out of his albuterol inhaler and is using symbicort 1 puff at bedtime.   feels fine first thing in am / fine outdoors / only problem is at hs x 2-5 y depending on pt vs wife's hx  (both very iffy) Presently denies llimited by breathing from desired activities but very sedentary Variable cough daytime not productive   No obvious patterns in  day to day or daytime variability or assoc excess/ purulent sputum or mucus plugs or hemoptysis or cp or chest tightness, subjective wheeze or overt sinus or hb symptoms. No unusual exposure hx or h/o childhood pna/ asthma or knowledge of premature birth.  Sleeping ok flat without nocturnal (0nce asleeep)   or early am exacerbation  of respiratory  c/o's or need for noct saba. Also denies any obvious fluctuation of symptoms with weather or environmental changes or other aggravating or alleviating factors except as outlined above   Current Allergies, Complete Past Medical History, Past Surgical History, Family History, and Social History were reviewed in [employer] record.  ROS  The following are not active  complaints unless bolded Hoarseness, sore throat, dysphagia, dental problems, itching, sneezing,  nasal congestion or discharge of excess mucus or purulent secretions, ear ache,   fever, chills, sweats, unintended wt loss or wt gain, classically pleuritic or exertional cp,  orthopnea pnd or leg swelling, presyncope, palpitations, abdominal pain, anorexia, nausea, vomiting, diarrhea  or change in bowel habits or change in bladder habits, change in stools or change in urine, dysuria, hematuria,  rash, arthralgias, visual complaints, headache, numbness, weakness or ataxia or problems with walking or coordination,  change in mood/affect or memory.        Current Meds  Medication Sig  . ALPRAZolam (XANAX) 0.5 MG tablet Take 1 tablet (0.5 mg total) by mouth daily as needed for anxiety.  Marland Kitchen aspirin EC 81 MG tablet Take 1 tablet (81 mg total) by mouth daily.  . Azelastine HCl 0.15 % SOLN Place 2 sprays into both nostrils 2 (two) times daily.  . Cholecalciferol (VITAMIN D) 1000 UNITS capsule Take 1,000 Units by mouth daily.    . cyanocobalamin 1000 MCG tablet Take 100 mcg by mouth daily.  Marland Kitchen desoximetasone (TOPICORT) 0.25 % cream apply sparingly to affected area twice a day as directed if needed  . levothyroxine (SYNTHROID, LEVOTHROID) 125 MCG tablet Take 1 tablet (125 mcg total) by mouth daily.  Marland Kitchen losartan (COZAAR) 100 MG tablet Take 1 tablet (100  mg total) by mouth daily.  . Multiple Vitamins-Minerals (CENTRUM SILVER PO) Take by mouth daily.    . polyethylene glycol powder (MIRALAX) powder Take 1/2 capful by mouth once daily  . rosuvastatin (CRESTOR) 10 MG tablet Take 1 tablet (10 mg total) by mouth daily.  . tamsulosin (FLOMAX) 0.4 MG CAPS capsule take 1 capsule by mouth at bedtime  . vitamin C (ASCORBIC ACID) 500 MG tablet Take 500 mg by mouth daily.  . [DISCONTINUED] lansoprazole (PREVACID) 30 MG capsule take 1 tablet by mouth once daily  .   SYMBICORT 160-4.5 MCG/ACT inhaler inhale 2 puffs INTO THE  LUNGS twice a day (Patient taking differently: 1 puff at bedtime)      Review of Systems     Objective:   Physical Exam    amb pleasant wm very iffy on details of care/ expressing symptoms  Wt Readings from Last 3 Encounters:  05/09/17 198 lb 12.8 oz (90.2 kg)  04/14/17 204 lb (92.5 kg)  04/13/17 204 lb (92.5 kg)     Vital signs reviewed - Note on arrival 02 sats  95% on RA     HEENT: nl dentition, turbinates bilaterally, and oropharynx. Nl external ear canals without cough reflex   NECK :  without JVD/Nodes/TM/ nl carotid upstrokes bilaterally   LUNGS: no acc muscle use,  Nl contour chest which is clear to A and P bilaterally without cough on insp or exp maneuvers   CV:  RRR  no s3 or  II/VI sem, no  increase in P2, and no edema   ABD:  soft and nontender with nl inspiratory excursion in the supine position. No bruits or organomegaly appreciated, bowel sounds nl  MS:  Nl gait/ ext warm without deformities, calf tenderness, cyanosis or clubbing No obvious joint restrictions   SKIN: warm and dry without lesions    NEURO:  alert, approp, nl sensorium with  no motor or cerebellar deficits apparent.    Cxr: none on file since 07/21/16 study wnl     Assessment:

## 2017-05-09 NOTE — Patient Instructions (Addendum)
symbicort 80 Take 2 puffs first thing in am and then another 2 puffs about 12 hours later.    Work on inhaler technique:  relax and gently blow all the way out then take a nice smooth deep breath back in, triggering the inhaler at same time you start breathing in.  Hold for up to 5 seconds if you can. Blow out thru nose. Rinse and gargle with water when done   Please schedule a follow up office visit in 4 weeks, sooner if needed  with all medications /inhalers/ solutions in hand so we can verify exactly what you are taking. This includes all medications from all doctors and over the counters - pfts on return also needs cxr

## 2017-05-10 ENCOUNTER — Telehealth: Payer: Self-pay | Admitting: *Deleted

## 2017-05-10 ENCOUNTER — Encounter: Payer: Self-pay | Admitting: Internal Medicine

## 2017-05-10 DIAGNOSIS — J45991 Cough variant asthma: Secondary | ICD-10-CM

## 2017-05-10 NOTE — Telephone Encounter (Signed)
LMTCB PFT order placed

## 2017-05-10 NOTE — Telephone Encounter (Signed)
Spoke with patients wife. Patient is scheduled for same day PFT.

## 2017-05-10 NOTE — Telephone Encounter (Signed)
-----   Message from Tanda Rockers, MD sent at 05/09/2017  5:25 PM EST ----- - pfts on return also needs cxr

## 2017-05-10 NOTE — Telephone Encounter (Signed)
Pt's wife is calling back [phone]

## 2017-05-10 NOTE — Assessment & Plan Note (Signed)
-  FENO 05/09/2017  =   21  -Spirometry 05/09/2017  FEV1 1.15 (41%)  Ratio 68 with mild curvature on symb 160 one qhs with poor hfa - 05/09/2017  After extensive coaching inhaler device  effectiveness =    50% from baseline near 0 so rec symb 80 2bid x 2 week trial then try back off if not better and max gerd rx.    Very difficult to sort out based on hx and today's studies how much is asthma vs uacs, the same problem Dr Elsworth Soho had in 2009.  He and his wife really struggle with concept of maint vs prns and med reconciliation so rec max rx for asthma x 2 weeks then shift to max gerd rx and just prn symbicort using the principle of keeping it simple.    If this is asthma, it might also explain why he has such severe "chest colds" that seem better only p pred.  Based on the study from NEJM  378; 20 p 1865 (2018) in pts with mild intermittent asthma it is reasonable to use low dose symbicort eg 80 2bid "prn" flare in this setting but I emphasized this was only shown with symbicort and takes advantage of the rapid onset of action but is not the same as "rescue therapy" but can be stopped once the acute symptoms have resolved .   Total time devoted to counseling  > 50 % of initial 60 min office visit:  review case with pt/wifediscussion of options/alternatives/ personally creating written customized instructions  in presence of pt  then going over those specific  Instructions directly with the pt including how to use all of the meds but in particular covering each new medication in detail and the difference between the maintenance= "automatic" meds and the prns using an action plan format for the latter (If this problem/symptom => do that organization reading Left to right).  Please see AVS from this visit for a full list of these instructions which I personally wrote for this pt and  are unique to this visit.

## 2017-05-25 ENCOUNTER — Other Ambulatory Visit: Payer: Self-pay | Admitting: Internal Medicine

## 2017-06-06 ENCOUNTER — Encounter: Payer: Self-pay | Admitting: Internal Medicine

## 2017-06-06 ENCOUNTER — Ambulatory Visit (INDEPENDENT_AMBULATORY_CARE_PROVIDER_SITE_OTHER): Payer: PPO | Admitting: Internal Medicine

## 2017-06-06 ENCOUNTER — Ambulatory Visit: Payer: PPO | Admitting: Internal Medicine

## 2017-06-06 ENCOUNTER — Ambulatory Visit (INDEPENDENT_AMBULATORY_CARE_PROVIDER_SITE_OTHER)
Admission: RE | Admit: 2017-06-06 | Discharge: 2017-06-06 | Disposition: A | Discharge status: Home / Self Care | Payer: PPO | Source: Ambulatory Visit | Attending: Internal Medicine | Admitting: Internal Medicine

## 2017-06-06 VITALS — BP 140/84 | HR 89 | Ht 71.0 in | Wt 199.0 lb

## 2017-06-06 DIAGNOSIS — J45991 Cough variant asthma: Secondary | ICD-10-CM

## 2017-06-06 DIAGNOSIS — R05 Cough: Secondary | ICD-10-CM | POA: Diagnosis not present

## 2017-06-06 LAB — PULMONARY FUNCTION TEST
DL/VA % pred: 94 %
DL/VA: 4.36 ml/min/mmHg/L
DLCO unc % pred: 56 %
DLCO unc: 19.03 ml/min/mmHg
FEF 25-75 Post: 1.85 L/sec
FEF 25-75 Pre: 1.31 L/sec
FEF2575-%CHANGE-POST: 41 %
FEF2575-%Pred-Post: 108 %
FEF2575-%Pred-Pre: 76 %
FEV1-%CHANGE-POST: 6 %
FEV1-%PRED-POST: 81 %
FEV1-%PRED-PRE: 76 %
FEV1-PRE: 2.05 L
FEV1-Post: 2.19 L
FEV1FVC-%CHANGE-POST: 2 %
FEV1FVC-%Pred-Pre: 102 %
FEV6-%Change-Post: 4 %
FEV6-%PRED-PRE: 78 %
FEV6-%Pred-Post: 82 %
FEV6-POST: 2.95 L
FEV6-PRE: 2.81 L
FEV6FVC-%Change-Post: 0 %
FEV6FVC-%PRED-POST: 107 %
FEV6FVC-%PRED-PRE: 106 %
FVC-%CHANGE-POST: 3 %
FVC-%PRED-PRE: 73 %
FVC-%Pred-Post: 76 %
FVC-PRE: 2.85 L
FVC-Post: 2.96 L
POST FEV6/FVC RATIO: 100 %
PRE FEV6/FVC RATIO: 99 %
Post FEV1/FVC ratio: 74 %
Pre FEV1/FVC ratio: 72 %
RV % pred: 93 %
RV: 2.68 L
TLC % PRED: 73 %
TLC: 5.32 L

## 2017-06-06 NOTE — Progress Notes (Signed)
PFT done today. 

## 2017-06-06 NOTE — Progress Notes (Signed)
Subjective:     Patient ID: Stephen Wilkins, male   DOB: Mar 27, 1928,     MRN: [id number]    Brief patient profile:  63 yowm never smoker with new pattern since around 2009  of "head cold going into chest" lasting up to 6 weeks typically better p prednisone but still left wheezing  at qhs (no delay)  better once asleep and does fine when wakes up in am so  referred to pulmonary clinic 05/09/2017 by Dr   Jenny Reichmann with w/u by Dr Elsworth Soho in 2009-10 c/w mild asthma plus component of uacs/ ? gerd related but no longer on ppi.    History of Present Illness  05/09/2017 1st [facility] office visit/ Wert   Chief Complaint  Patient presents with  . Pulmonary Consult    Referred by Dr. Cathlean Cower for eval of Asthma.  He states he was dxed with Asthma approx 2 yrs ago. He is bothered by wheezing that occurs mainly at night.  He has occ cough and SOB.  His cough is non prod. He is currently out of his albuterol inhaler and is using symbicort 1 puff at bedtime.   feels fine first thing in am / fine outdoors / only problem is at hs x 2-5 y depending on pt vs wife's hx  (both very iffy) Presently denies llimited by breathing from desired activities but very sedentary Variable cough daytime not productive rec symbicort 80 Take 2 puffs first thing in am and then another 2 puffs about 12 hours later.  Work on inhaler technique:  Please schedule a follow up office visit in 4 weeks, sooner if needed  with all medications /inhalers/ solutions in hand so we can verify exactly what you are taking. This includes all medications from all doctors and over the counters      06/06/2017  f/u ov/Wert re: mild asthma on symb 80 2bid taper to one qhs  Chief Complaint  Patient presents with  . Follow-up    Wheezing has resolved and he is no longer coughing. He has not had to use his albuterol. He is only using the Symbicort at hs.    Dyspnea:  Treadmill x 30 min up 5 x weekly x 74mph highest ? Flat  Cough: non Sleep: side   SABA use: none   No obvious day to day or daytime variability or assoc excess/ purulent sputum or mucus plugs or hemoptysis or cp or chest tightness, subjective wheeze or overt sinus or hb symptoms. No unusual exposure hx or h/o childhood pna/ asthma or knowledge of premature birth.  Sleeping ok flat without nocturnal  or early am exacerbation  of respiratory  c/o's or need for noct saba. Also denies any obvious fluctuation of symptoms with weather or environmental changes or other aggravating or alleviating factors except as outlined above   Current Allergies, Complete Past Medical History, Past Surgical History, Family History, and Social History were reviewed in [employer] record.  ROS  The following are not active complaints unless bolded Hoarseness, sore throat, dysphagia, dental problems, itching, sneezing,  nasal congestion or discharge of excess mucus or purulent secretions, ear ache,   fever, chills, sweats, unintended wt loss or wt gain, classically pleuritic or exertional cp,  orthopnea pnd or leg swelling, presyncope, palpitations, abdominal pain, anorexia, nausea, vomiting, diarrhea  or change in bowel habits or change in bladder habits, change in stools or change in urine, dysuria, hematuria,  rash, arthralgias, visual complaints, headache, numbness,  weakness or ataxia or problems with walking or coordination,  change in mood/affect or memory.        Current Meds  Medication Sig  . albuterol (PROVENTIL HFA;VENTOLIN HFA) 108 (90 Base) MCG/ACT inhaler Inhale 2 puffs into the lungs every 6 (six) hours as needed for wheezing.  Marland Kitchen ALPRAZolam (XANAX) 0.5 MG tablet Take 1 tablet (0.5 mg total) by mouth daily as needed for anxiety.  Marland Kitchen aspirin EC 81 MG tablet Take 1 tablet (81 mg total) by mouth daily.  . Azelastine HCl 0.15 % SOLN Place 2 sprays into both nostrils 2 (two) times daily.  . budesonide-formoterol (SYMBICORT) 80-4.5 MCG/ACT inhaler Take 2 puffs first thing  in am and then another 2 puffs about 12 hours later. (Patient taking differently: Take 2 puffs at bedtime)  . Cholecalciferol (VITAMIN D) 1000 UNITS capsule Take 1,000 Units by mouth daily.    . cyanocobalamin 1000 MCG tablet Take 100 mcg by mouth daily.  Marland Kitchen desoximetasone (TOPICORT) 0.25 % cream APPLY SPARINGLY to affected area twice a day if needed as directed  . levothyroxine (SYNTHROID, LEVOTHROID) 125 MCG tablet Take 1 tablet (125 mcg total) by mouth daily.  Marland Kitchen losartan (COZAAR) 100 MG tablet Take 1 tablet (100 mg total) by mouth daily.  . Multiple Vitamins-Minerals (CENTRUM SILVER PO) Take by mouth daily.    . rosuvastatin (CRESTOR) 10 MG tablet Take 1 tablet (10 mg total) by mouth daily.  . tamsulosin (FLOMAX) 0.4 MG CAPS capsule take 1 capsule by mouth at bedtime  . vitamin C (ASCORBIC ACID) 500 MG tablet Take 500 mg by mouth daily.                Objective:   Physical Exam    amb pleasant wm nad    06/06/2017         199   05/09/17 198 lb 12.8 oz (90.2 kg)  04/14/17 204 lb (92.5 kg)  04/13/17 204 lb (92.5 kg)     Vital signs reviewed - Note on arrival 02 sats  100% on RA       HEENT: nl dentition, turbinates bilaterally, and oropharynx. Nl external ear canals without cough reflex   NECK :  without JVD/Nodes/TM/ nl carotid upstrokes bilaterally   LUNGS: no acc muscle use,  Nl contour chest which is clear to A and P bilaterally without cough on insp or exp maneuvers   CV:  RRR  no s3 or murmur or increase in P2, and no edema   ABD:  soft and nontender with nl inspiratory excursion in the supine position. No bruits or organomegaly appreciated, bowel sounds nl  MS:  Nl gait/ ext warm without deformities, calf tenderness, cyanosis or clubbing No obvious joint restrictions   SKIN: warm and dry without lesions    NEURO:  alert, approp, nl sensorium with  no motor or cerebellar deficits apparent.      CXR PA and Lateral:   06/06/2017 :    I personally reviewed  images and agree with radiology impression as follows:    No active cardiopulmonary disease.       Assessment:

## 2017-06-06 NOTE — Patient Instructions (Signed)
Please remember to go to the  x-ray department downstairs in the basement  for your tests - we will call you with the results when they are available.      If you are satisfied with your treatment plan,  let your doctor know and he/she can either refill your medications or you can return here when your prescription runs out.     If in any way you are not 100% satisfied,  please tell us.  If 100% better, tell your friends!  Pulmonary follow up is as needed

## 2017-06-07 ENCOUNTER — Encounter: Payer: Self-pay | Admitting: Internal Medicine

## 2017-06-07 ENCOUNTER — Telehealth: Payer: Self-pay | Admitting: Internal Medicine

## 2017-06-07 NOTE — Assessment & Plan Note (Signed)
-  FENO 05/09/2017  =   21  -Spirometry 05/09/2017  FEV1 1.15 (41%)  Ratio 68 with mild curvature on symb 160 one qhs with poor hfa - 05/09/2017  After extensive coaching inhaler device  effectiveness =    50% from baseline near 0 so rec symb 80 2bid x 2 week trial then try back off if not better and max gerd rx - PFT's  06/06/2017  FEV1 2.19 (81 % ) ratio 74  p 6 % improvement from saba p nothing prior to study with DLCO  56 % corrects to 94 % for alv volume   - 06/06/2017  After extensive coaching inhaler device  effectiveness =    75% so continue symb 80 1-2 bid  He has relatively mild asthma and no significant restriction (other than related to body habitus as reflected in ERV of 44%) .  Based on the study from NEJM  378; 39 p 1865 (2018) in pts with mild asthma it is reasonable to use low dose symbicort eg 80 2bid "prn" flare in this setting but I emphasized this was only shown with symbicort and takes advantage of the rapid onset of action but is not the same as "rescue therapy" but can be stopped once the acute symptoms have resolved and the need for rescue has been minimized (< 2 x weekly)    I had an extended discussion with the patient and his wife  reviewing all relevant studies completed to date and  lasting 15 to 20 minutes of a 25 minute final summary office  visit    Each maintenance medication was reviewed in detail including most importantly the difference between maintenance and prns and under what circumstances the prns are to be triggered using an action plan format that is not reflected in the computer generated alphabetically organized AVS.    Please see AVS for specific instructions unique to this visit that I personally wrote and verbalized to the the pt in detail and then reviewed with pt  by my nurse highlighting any  changes in therapy recommended at today's visit to their plan of care.

## 2017-06-07 NOTE — Telephone Encounter (Signed)
Called and spoke with patients wife, she is aware and verbalized understanding. Nothing further needed.

## 2017-06-08 NOTE — Progress Notes (Signed)
LMTCB x2 on preferred phone number listed for patient. 

## 2017-06-09 NOTE — Progress Notes (Signed)
Pt already given results  °

## 2017-06-13 DIAGNOSIS — M272 Inflammatory conditions of jaws: Secondary | ICD-10-CM | POA: Diagnosis not present

## 2017-06-16 DIAGNOSIS — K045 Chronic apical periodontitis: Secondary | ICD-10-CM | POA: Diagnosis not present

## 2017-07-11 ENCOUNTER — Encounter: Payer: Self-pay | Admitting: Internal Medicine

## 2017-07-11 ENCOUNTER — Ambulatory Visit (INDEPENDENT_AMBULATORY_CARE_PROVIDER_SITE_OTHER): Payer: PPO | Admitting: Internal Medicine

## 2017-07-11 VITALS — BP 120/84 | HR 89 | Temp 97.7°F | Ht 71.0 in | Wt 202.0 lb

## 2017-07-11 DIAGNOSIS — M545 Low back pain, unspecified: Secondary | ICD-10-CM | POA: Insufficient documentation

## 2017-07-11 DIAGNOSIS — I10 Essential (primary) hypertension: Secondary | ICD-10-CM

## 2017-07-11 DIAGNOSIS — R739 Hyperglycemia, unspecified: Secondary | ICD-10-CM | POA: Diagnosis not present

## 2017-07-11 MED ORDER — TIZANIDINE HCL 2 MG PO TABS: 2.0000 mg | ORAL_TABLET | Freq: Four times a day (QID) | ORAL | 1 refills | Status: DC | PRN

## 2017-07-11 MED ORDER — TRAMADOL HCL 50 MG PO TABS: 50.0000 mg | ORAL_TABLET | Freq: Four times a day (QID) | ORAL | 0 refills | Status: DC | PRN

## 2017-07-11 NOTE — Patient Instructions (Addendum)
Please take all new medication as prescribed - the pain medication, and muscle relaxer as needed  Please continue all other medications as before, and refills have been done if requested.  Please have the pharmacy call with any other refills you may need.  Please keep your appointments with your specialists as you may have planned

## 2017-07-11 NOTE — Assessment & Plan Note (Signed)
D/w pt, exam c/w MSK strain vs underlying DJD or DDD pain flare; for pain control, muscle relaxer prn, pt reassured as I think we can hold on imaging or other testing at this time such as UA

## 2017-07-11 NOTE — Assessment & Plan Note (Signed)
stable overall by history and exam, recent data reviewed with pt, and pt to continue medical treatment as before,  to f/u any worsening symptoms or concerns BP Readings from Last 3 Encounters:  07/11/17 120/84  06/06/17 140/84  05/09/17 110/72

## 2017-07-11 NOTE — Progress Notes (Addendum)
Subjective:    Patient ID: Stephen Wilkins, male    DOB: [birth year], 84 y.o.   MRN: [id number]  HPI  Here with c/o "left hip pain" pointing to his left lower back, moderate, intermittent, sharp, worse to stand up and walk or bend, better to lie down.  Pt denies bowel or bladder change, fever, wt loss,  worsening LE pain/numbness/weakness, gait change or falls.  Denies urinary symptoms such as dysuria, frequency, urgency, flank pain, hematuria or n/v, fever, chills.  Denies worsening reflux, abd pain, dysphagia, n/v, bowel change or blood.   Pt denies polydipsia, polyuria.  Pt denies chest pain, increased sob or doe, wheezing, orthopnea, PND, increased LE swelling, palpitations, dizziness or syncope.    Past Medical History:  Diagnosis Date  . ABNORMAL ELECTROCARDIOGRAM 06/21/2007  . ALLERGIC RHINITIS 03/23/2007  . Anxiety state, unspecified 09/06/2013  . ASTHMATIC BRONCHITIS, ACUTE 04/27/2007  . BENIGN PROSTATIC HYPERTROPHY 03/23/2007  . BRONCHITIS NOT SPECIFIED AS ACUTE OR CHRONIC 04/21/2007  . BURSITIS, RIGHT HIP 07/31/2008  . COPD (chronic obstructive pulmonary disease) ([place]) 04/19/2016  . Cough 01/29/2009  . Dementia 09/01/2010  . ERECTILE DYSFUNCTION 03/23/2007  . ERECTILE DYSFUNCTION, ORGANIC 04/17/2009  . Esophageal stricture   . FREQUENCY, URINARY 04/26/2010  . GERD 03/23/2007  . HIATAL HERNIA   . HYPERLIPIDEMIA 03/23/2007  . HYPERTENSION 03/23/2007  . MILD COGNITIVE IMPAIRMENT SO STATED 05/19/2010  . NEPHROLITHIASIS, HX OF 03/23/2007  . PSA, INCREASED 06/27/2008  . RASH-NONVESICULAR 03/23/2007  . REACTIVE AIRWAY DISEASE 01/14/2010  . SCHATZKI'S RING   . SCIATICA, RIGHT 04/28/2008  . Unspecified hypothyroidism 09/06/2013  . Wheezing 04/17/2009   Past Surgical History:  Procedure Laterality Date  . TONSILLECTOMY      reports that he has never smoked. He has never used smokeless tobacco. He reports that he does not drink alcohol or use drugs. family history includes Asthma in his  sister; Cancer in his unknown relative; Emphysema in his brother; Hypertension in his unknown relative; Lung cancer in his brother. Allergies  Allergen Reactions  . Atorvastatin     REACTION: myalgia  . Doxazosin Mesylate     REACTION: dizziness  . Hydrocodone-Homatropine Other (See Comments)    anxiety   Current Outpatient Medications on File Prior to Visit  Medication Sig Dispense Refill  . albuterol (PROVENTIL HFA;VENTOLIN HFA) 108 (90 Base) MCG/ACT inhaler Inhale 2 puffs into the lungs every 6 (six) hours as needed for wheezing. 1 Inhaler 11  . ALPRAZolam (XANAX) 0.5 MG tablet Take 1 tablet (0.5 mg total) by mouth daily as needed for anxiety. 30 tablet 2  . aspirin EC 81 MG tablet Take 1 tablet (81 mg total) by mouth daily. 90 tablet 11  . Azelastine HCl 0.15 % SOLN Place 2 sprays into both nostrils 2 (two) times daily. 30 mL 0  . budesonide-formoterol (SYMBICORT) 80-4.5 MCG/ACT inhaler Take 2 puffs first thing in am and then another 2 puffs about 12 hours later. (Patient taking differently: Take 2 puffs at bedtime) 1 Inhaler 11  . Cholecalciferol (VITAMIN D) 1000 UNITS capsule Take 1,000 Units by mouth daily.      . cyanocobalamin 1000 MCG tablet Take 100 mcg by mouth daily.    Marland Kitchen desoximetasone (TOPICORT) 0.25 % cream APPLY SPARINGLY to affected area twice a day if needed as directed 30 g 0  . levothyroxine (SYNTHROID, LEVOTHROID) 125 MCG tablet Take 1 tablet (125 mcg total) by mouth daily. 90 tablet 3  . losartan (COZAAR) 100 MG tablet Take  1 tablet (100 mg total) by mouth daily. 90 tablet 3  . Multiple Vitamins-Minerals (CENTRUM SILVER PO) Take by mouth daily.      . polyethylene glycol powder (MIRALAX) powder Take 1/2 capful by mouth once daily 850 g 5  . rosuvastatin (CRESTOR) 10 MG tablet Take 1 tablet (10 mg total) by mouth daily. 90 tablet 3  . tamsulosin (FLOMAX) 0.4 MG CAPS capsule take 1 capsule by mouth at bedtime 90 capsule 0  . vitamin C (ASCORBIC ACID) 500 MG tablet Take  500 mg by mouth daily.     No current facility-administered medications on file prior to visit.    Review of Systems  Constitutional: Negative for other unusual diaphoresis or sweats HENT: Negative for ear discharge or swelling Eyes: Negative for other worsening visual disturbances Respiratory: Negative for stridor or other swelling  Gastrointestinal: Negative for worsening distension or other blood Genitourinary: Negative for retention or other urinary change Musculoskeletal: Negative for other MSK pain or swelling Skin: Negative for color change or other new lesions Neurological: Negative for worsening tremors and other numbness  Psychiatric/Behavioral: Negative for worsening agitation or other fatigue All other system neg per pt    Objective:   Physical Exam BP 120/84   Pulse 89   Temp 97.7 F (36.5 C) (Oral)   Ht 5\' 11"  (1.803 m)   Wt 202 lb (91.6 kg)   SpO2 95%   BMI 28.17 kg/m  VS noted,  Constitutional: Pt appears in NAD HENT: Head: NCAT.  Right Ear: External ear normal.  Left Ear: External ear normal.  Eyes: . Pupils are equal, round, and reactive to light. Conjunctivae and EOM are normal Nose: without d/c or deformity Neck: Neck supple. Gross normal ROM Cardiovascular: Normal rate and regular rhythm.   Pulmonary/Chest: Effort normal and breath sounds without rales or wheezing.  Abd:  Soft, NT, ND, + BS, no organomegaly Spine: nontender in midline but tender to left lumbar paravertebral Neurological: Pt is alert. At baseline orientation, motor 5/5 intact Skin: Skin is warm. No rashes, other new lesions, no LE edema Psychiatric: Pt behavior is normal without agitation  No other exam findings    Assessment & Plan:

## 2017-07-11 NOTE — Assessment & Plan Note (Signed)
stable overall by history and exam, recent data reviewed with pt, and pt to continue medical treatment as before,  to f/u any worsening symptoms or concerns Lab Results  Component Value Date   HGBA1C 6.0 04/14/2017

## 2017-07-18 DIAGNOSIS — I129 Hypertensive chronic kidney disease with stage 1 through stage 4 chronic kidney disease, or unspecified chronic kidney disease: Secondary | ICD-10-CM | POA: Diagnosis not present

## 2017-07-18 DIAGNOSIS — N184 Chronic kidney disease, stage 4 (severe): Secondary | ICD-10-CM | POA: Diagnosis not present

## 2017-07-18 DIAGNOSIS — N400 Enlarged prostate without lower urinary tract symptoms: Secondary | ICD-10-CM | POA: Diagnosis not present

## 2017-07-19 ENCOUNTER — Other Ambulatory Visit: Payer: Self-pay | Admitting: Nephrology

## 2017-07-19 DIAGNOSIS — N400 Enlarged prostate without lower urinary tract symptoms: Secondary | ICD-10-CM

## 2017-07-19 DIAGNOSIS — N184 Chronic kidney disease, stage 4 (severe): Secondary | ICD-10-CM

## 2017-07-19 DIAGNOSIS — I129 Hypertensive chronic kidney disease with stage 1 through stage 4 chronic kidney disease, or unspecified chronic kidney disease: Secondary | ICD-10-CM

## 2017-07-21 ENCOUNTER — Ambulatory Visit
Admission: RE | Admit: 2017-07-21 | Discharge: 2017-07-21 | Disposition: A | Discharge status: Home / Self Care | Payer: PPO | Source: Ambulatory Visit | Attending: Nephrology | Admitting: Nephrology

## 2017-07-21 ENCOUNTER — Other Ambulatory Visit: Payer: PPO

## 2017-07-21 DIAGNOSIS — I129 Hypertensive chronic kidney disease with stage 1 through stage 4 chronic kidney disease, or unspecified chronic kidney disease: Secondary | ICD-10-CM

## 2017-07-21 DIAGNOSIS — N184 Chronic kidney disease, stage 4 (severe): Secondary | ICD-10-CM

## 2017-07-21 DIAGNOSIS — N400 Enlarged prostate without lower urinary tract symptoms: Secondary | ICD-10-CM

## 2017-07-21 DIAGNOSIS — N189 Chronic kidney disease, unspecified: Secondary | ICD-10-CM | POA: Diagnosis not present

## 2017-07-31 ENCOUNTER — Ambulatory Visit (INDEPENDENT_AMBULATORY_CARE_PROVIDER_SITE_OTHER): Payer: PPO | Admitting: Internal Medicine

## 2017-07-31 ENCOUNTER — Encounter: Payer: Self-pay | Admitting: Internal Medicine

## 2017-07-31 VITALS — BP 132/84 | HR 81 | Temp 98.2°F | Ht 71.0 in | Wt 201.0 lb

## 2017-07-31 DIAGNOSIS — R739 Hyperglycemia, unspecified: Secondary | ICD-10-CM

## 2017-07-31 DIAGNOSIS — J4541 Moderate persistent asthma with (acute) exacerbation: Secondary | ICD-10-CM | POA: Diagnosis not present

## 2017-07-31 DIAGNOSIS — N189 Chronic kidney disease, unspecified: Secondary | ICD-10-CM

## 2017-07-31 DIAGNOSIS — I10 Essential (primary) hypertension: Secondary | ICD-10-CM

## 2017-07-31 DIAGNOSIS — R05 Cough: Secondary | ICD-10-CM

## 2017-07-31 DIAGNOSIS — R059 Cough, unspecified: Secondary | ICD-10-CM

## 2017-07-31 MED ORDER — BENZONATATE 100 MG PO CAPS: 100.0000 mg | ORAL_CAPSULE | Freq: Three times a day (TID) | ORAL | 1 refills | Status: DC | PRN

## 2017-07-31 MED ORDER — METHYLPREDNISOLONE ACETATE 80 MG/ML IJ SUSP
80.0000 mg | Freq: Once | INTRAMUSCULAR | Status: AC
  Administered 2017-07-31: 80 mg via INTRAMUSCULAR

## 2017-07-31 MED ORDER — PREDNISONE 10 MG PO TABS: ORAL_TABLET | ORAL | 0 refills | Status: DC

## 2017-07-31 NOTE — Assessment & Plan Note (Signed)
Mild to mod, for depomedrol IM 80, predpac asd, cont inhalers,  to f/u any worsening symptoms or concerns

## 2017-07-31 NOTE — Progress Notes (Signed)
Subjective:    Patient ID: Stephen Wilkins, male    DOB: June 15, 1927, 84 y.o.   MRN: [id number]  HPI    Here with acute onset mild to mod 2-3 days ST, general weakness with non prod cough clear sputum assoc with wheezing, sob/doe not well controlled with inhalers, but Pt denies chest pain, orthopnea, PND, increased LE swelling, palpitations, dizziness or syncope.  Pt denies new neurological symptoms such as new headache, or facial or extremity weakness or numbness   Pt denies polydipsia, polyuria Past Medical History:  Diagnosis Date  . ABNORMAL ELECTROCARDIOGRAM 06/21/2007  . ALLERGIC RHINITIS 03/23/2007  . Anxiety state, unspecified 09/06/2013  . ASTHMATIC BRONCHITIS, ACUTE 04/27/2007  . BENIGN PROSTATIC HYPERTROPHY 03/23/2007  . BRONCHITIS NOT SPECIFIED AS ACUTE OR CHRONIC 04/21/2007  . BURSITIS, RIGHT HIP 07/31/2008  . COPD (chronic obstructive pulmonary disease) ([place]) 04/19/2016  . Cough 01/29/2009  . Dementia 09/01/2010  . ERECTILE DYSFUNCTION 03/23/2007  . ERECTILE DYSFUNCTION, ORGANIC 04/17/2009  . Esophageal stricture   . FREQUENCY, URINARY 04/26/2010  . GERD 03/23/2007  . HIATAL HERNIA   . HYPERLIPIDEMIA 03/23/2007  . HYPERTENSION 03/23/2007  . MILD COGNITIVE IMPAIRMENT SO STATED 05/19/2010  . NEPHROLITHIASIS, HX OF 03/23/2007  . PSA, INCREASED 06/27/2008  . RASH-NONVESICULAR 03/23/2007  . REACTIVE AIRWAY DISEASE 01/14/2010  . SCHATZKI'S RING   . SCIATICA, RIGHT 04/28/2008  . Unspecified hypothyroidism 09/06/2013  . Wheezing 04/17/2009   Past Surgical History:  Procedure Laterality Date  . TONSILLECTOMY      reports that he has never smoked. He has never used smokeless tobacco. He reports that he does not drink alcohol or use drugs. family history includes Asthma in his sister; Cancer in his unknown relative; Emphysema in his brother; Hypertension in his unknown relative; Lung cancer in his brother. Allergies  Allergen Reactions  . Atorvastatin     REACTION: myalgia  . Doxazosin  Mesylate     REACTION: dizziness  . Hydrocodone-Homatropine Other (See Comments)    anxiety   Current Outpatient Medications on File Prior to Visit  Medication Sig Dispense Refill  . albuterol (PROVENTIL HFA;VENTOLIN HFA) 108 (90 Base) MCG/ACT inhaler Inhale 2 puffs into the lungs every 6 (six) hours as needed for wheezing. 1 Inhaler 11  . ALPRAZolam (XANAX) 0.5 MG tablet Take 1 tablet (0.5 mg total) by mouth daily as needed for anxiety. 30 tablet 2  . aspirin EC 81 MG tablet Take 1 tablet (81 mg total) by mouth daily. 90 tablet 11  . Azelastine HCl 0.15 % SOLN Place 2 sprays into both nostrils 2 (two) times daily. 30 mL 0  . budesonide-formoterol (SYMBICORT) 80-4.5 MCG/ACT inhaler Take 2 puffs first thing in am and then another 2 puffs about 12 hours later. (Patient taking differently: Take 2 puffs at bedtime) 1 Inhaler 11  . Cholecalciferol (VITAMIN D) 1000 UNITS capsule Take 1,000 Units by mouth daily.      . cyanocobalamin 1000 MCG tablet Take 100 mcg by mouth daily.    Marland Kitchen desoximetasone (TOPICORT) 0.25 % cream APPLY SPARINGLY to affected area twice a day if needed as directed 30 g 0  . levothyroxine (SYNTHROID, LEVOTHROID) 125 MCG tablet Take 1 tablet (125 mcg total) by mouth daily. 90 tablet 3  . losartan (COZAAR) 100 MG tablet Take 1 tablet (100 mg total) by mouth daily. 90 tablet 3  . Multiple Vitamins-Minerals (CENTRUM SILVER PO) Take by mouth daily.      . polyethylene glycol powder (MIRALAX) powder Take 1/2  capful by mouth once daily 850 g 5  . rosuvastatin (CRESTOR) 10 MG tablet Take 1 tablet (10 mg total) by mouth daily. 90 tablet 3  . tamsulosin (FLOMAX) 0.4 MG CAPS capsule take 1 capsule by mouth at bedtime 90 capsule 0  . tiZANidine (ZANAFLEX) 2 MG tablet Take 1 tablet (2 mg total) by mouth every 6 (six) hours as needed for muscle spasms. 40 tablet 1  . traMADol (ULTRAM) 50 MG tablet Take 1 tablet (50 mg total) by mouth every 6 (six) hours as needed. 28 tablet 0  . vitamin C  (ASCORBIC ACID) 500 MG tablet Take 500 mg by mouth daily.     No current facility-administered medications on file prior to visit.    Review of Systems  Constitutional: Negative for other unusual diaphoresis or sweats HENT: Negative for ear discharge or swelling Eyes: Negative for other worsening visual disturbances Respiratory: Negative for stridor or other swelling  Gastrointestinal: Negative for worsening distension or other blood Genitourinary: Negative for retention or other urinary change Musculoskeletal: Negative for other MSK pain or swelling Skin: Negative for color change or other new lesions Neurological: Negative for worsening tremors and other numbness  Psychiatric/Behavioral: Negative for worsening agitation or other fatigue All other system neg per pt    Objective:   Physical Exam BP 132/84   Pulse 81   Temp 98.2 F (36.8 C) (Oral)   Ht 5\' 11"  (1.803 m)   Wt 201 lb (91.2 kg)   SpO2 94%   BMI 28.03 kg/m  VS noted, not ill appearing Constitutional: Pt appears in NAD HENT: Head: NCAT.  Right Ear: External ear normal.  Left Ear: External ear normal.  Eyes: . Pupils are equal, round, and reactive to light. Conjunctivae and EOM are normal Bilat tm's with mild erythema.  Max sinus areas non tender.  Pharynx with mild erythema, no exudate Nose: without d/c or deformity Neck: Neck supple. Gross normal ROM Cardiovascular: Normal rate and regular rhythm.   Pulmonary/Chest: Effort normal and breath sounds decreased without rales but with few bilat scattered wheezing.  Neurological: Pt is alert. At baseline orientation, motor grossly intact Skin: Skin is warm. No rashes, other new lesions, no LE edema Psychiatric: Pt behavior is normal without agitation  No other exam findings      Assessment & Plan:

## 2017-07-31 NOTE — Assessment & Plan Note (Signed)
Lab Results  Component Value Date   CREATININE 2.32 (H) 04/14/2017  stable overall by history and exam, recent data reviewed with pt, and pt to continue medical treatment as before,  to f/u any worsening symptoms or concerns, cont f/u with renal

## 2017-07-31 NOTE — Assessment & Plan Note (Signed)
Most likely due to asthma exac, for cough med prn,  to f/u any worsening symptoms or concerns

## 2017-07-31 NOTE — Assessment & Plan Note (Signed)
stable overall by history and exam, recent data reviewed with pt, and pt to continue medical treatment as before,  to f/u any worsening symptoms or concerns BP Readings from Last 3 Encounters:  07/31/17 132/84  07/11/17 120/84  06/06/17 140/84

## 2017-07-31 NOTE — Assessment & Plan Note (Signed)
Lab Results  Component Value Date   HGBA1C 6.0 04/14/2017  stable overall by history and exam, recent data reviewed with pt, and pt to continue medical treatment as before,  to f/u any worsening symptoms or concerns

## 2017-07-31 NOTE — Patient Instructions (Signed)
You had the steroid shot today  Please take all new medication as prescribed - the tessalon perle as needed, and prednisone  Please continue all other medications as before, and refills have been done if requested.  Please have the pharmacy call with any other refills you may need.  Please keep your appointments with your specialists as you may have planned

## 2017-08-10 ENCOUNTER — Encounter: Payer: Self-pay | Admitting: Internal Medicine

## 2017-08-10 ENCOUNTER — Ambulatory Visit (INDEPENDENT_AMBULATORY_CARE_PROVIDER_SITE_OTHER): Payer: PPO | Admitting: Internal Medicine

## 2017-08-10 VITALS — BP 120/54 | HR 80 | Temp 97.9°F | Ht 71.0 in | Wt 203.0 lb

## 2017-08-10 DIAGNOSIS — J449 Chronic obstructive pulmonary disease, unspecified: Secondary | ICD-10-CM

## 2017-08-10 DIAGNOSIS — J309 Allergic rhinitis, unspecified: Secondary | ICD-10-CM

## 2017-08-10 DIAGNOSIS — I10 Essential (primary) hypertension: Secondary | ICD-10-CM

## 2017-08-10 MED ORDER — MONTELUKAST SODIUM 10 MG PO TABS: 10.0000 mg | ORAL_TABLET | Freq: Every day | ORAL | 11 refills | Status: DC

## 2017-08-10 MED ORDER — CETIRIZINE HCL 10 MG PO TABS: 10.0000 mg | ORAL_TABLET | Freq: Every day | ORAL | 11 refills | Status: DC

## 2017-08-10 NOTE — Assessment & Plan Note (Signed)
stable overall by history and exam, recent data reviewed with pt, and pt to continue medical treatment as before,  to f/u any worsening symptoms or concerns BP Readings from Last 3 Encounters:  08/10/17 (!) 120/54  07/31/17 132/84  07/11/17 120/84

## 2017-08-10 NOTE — Assessment & Plan Note (Signed)
stable overall by history and exam, and pt to continue medical treatment as before,  to f/u any worsening symptoms or concerns 

## 2017-08-10 NOTE — Assessment & Plan Note (Signed)
Mild uncontrolled, suspect related to current cough with post nasal gtt, for add singulair 10 qd, zyrtec 10 qd prn, and mucinex bid prn,  to f/u any worsening symptoms or concerns

## 2017-08-10 NOTE — Progress Notes (Signed)
Subjective:    Patient ID: Stephen Wilkins, male    DOB: 06/08/1927, 84 y.o.   MRN: [id number]  HPI  Here with hx of dementia and COPD/asthma and allergies, with hx of difficult historian and medication noncompliance, who presents today c/o persistent cough, non productive, pt states no help with recent tx with depomedrol, tessalon and prednisone.  He is a bit vague but states he thinks he is taking his meds and inhalers as prescribed.  Wife with better memory is present but does not oversee his med use closely due to some friction with him it seems.  Last cxr neg for acute mar 2019.  Has ongoing sinus allergy congestion, but no wheezing or worsening sob this time.   Pt denies fever, wt loss, night sweats, loss of appetite, or other constitutional symptoms  Dementia overall stable symptomatically, and not assoc with behavioral changes such as hallucinations, paranoia, or agitation.   Past Medical History:  Diagnosis Date  . ABNORMAL ELECTROCARDIOGRAM 06/21/2007  . ALLERGIC RHINITIS 03/23/2007  . Anxiety state, unspecified 09/06/2013  . ASTHMATIC BRONCHITIS, ACUTE 04/27/2007  . BENIGN PROSTATIC HYPERTROPHY 03/23/2007  . BRONCHITIS NOT SPECIFIED AS ACUTE OR CHRONIC 04/21/2007  . BURSITIS, RIGHT HIP 07/31/2008  . COPD (chronic obstructive pulmonary disease) ([place]) 04/19/2016  . Cough 01/29/2009  . Dementia 09/01/2010  . ERECTILE DYSFUNCTION 03/23/2007  . ERECTILE DYSFUNCTION, ORGANIC 04/17/2009  . Esophageal stricture   . FREQUENCY, URINARY 04/26/2010  . GERD 03/23/2007  . HIATAL HERNIA   . HYPERLIPIDEMIA 03/23/2007  . HYPERTENSION 03/23/2007  . MILD COGNITIVE IMPAIRMENT SO STATED 05/19/2010  . NEPHROLITHIASIS, HX OF 03/23/2007  . PSA, INCREASED 06/27/2008  . RASH-NONVESICULAR 03/23/2007  . REACTIVE AIRWAY DISEASE 01/14/2010  . SCHATZKI'S RING   . SCIATICA, RIGHT 04/28/2008  . Unspecified hypothyroidism 09/06/2013  . Wheezing 04/17/2009   Past Surgical History:  Procedure Laterality Date  .  TONSILLECTOMY      reports that he has never smoked. He has never used smokeless tobacco. He reports that he does not drink alcohol or use drugs. family history includes Asthma in his sister; Cancer in his unknown relative; Emphysema in his brother; Hypertension in his unknown relative; Lung cancer in his brother. Allergies  Allergen Reactions  . Atorvastatin     REACTION: myalgia  . Doxazosin Mesylate     REACTION: dizziness  . Hydrocodone-Homatropine Other (See Comments)    anxiety   Current Outpatient Medications on File Prior to Visit  Medication Sig Dispense Refill  . albuterol (PROVENTIL HFA;VENTOLIN HFA) 108 (90 Base) MCG/ACT inhaler Inhale 2 puffs into the lungs every 6 (six) hours as needed for wheezing. 1 Inhaler 11  . ALPRAZolam (XANAX) 0.5 MG tablet Take 1 tablet (0.5 mg total) by mouth daily as needed for anxiety. 30 tablet 2  . aspirin EC 81 MG tablet Take 1 tablet (81 mg total) by mouth daily. 90 tablet 11  . Azelastine HCl 0.15 % SOLN Place 2 sprays into both nostrils 2 (two) times daily. 30 mL 0  . benzonatate (TESSALON PERLES) 100 MG capsule Take 1 capsule (100 mg total) by mouth 3 (three) times daily as needed for cough. 40 capsule 1  . budesonide-formoterol (SYMBICORT) 80-4.5 MCG/ACT inhaler Take 2 puffs first thing in am and then another 2 puffs about 12 hours later. (Patient taking differently: Take 2 puffs at bedtime) 1 Inhaler 11  . Cholecalciferol (VITAMIN D) 1000 UNITS capsule Take 1,000 Units by mouth daily.      Marland Kitchen  cyanocobalamin 1000 MCG tablet Take 100 mcg by mouth daily.    Marland Kitchen desoximetasone (TOPICORT) 0.25 % cream APPLY SPARINGLY to affected area twice a day if needed as directed 30 g 0  . levothyroxine (SYNTHROID, LEVOTHROID) 125 MCG tablet Take 1 tablet (125 mcg total) by mouth daily. 90 tablet 3  . losartan (COZAAR) 100 MG tablet Take 1 tablet (100 mg total) by mouth daily. 90 tablet 3  . Multiple Vitamins-Minerals (CENTRUM SILVER PO) Take by mouth daily.       . polyethylene glycol powder (MIRALAX) powder Take 1/2 capful by mouth once daily 850 g 5  . rosuvastatin (CRESTOR) 10 MG tablet Take 1 tablet (10 mg total) by mouth daily. 90 tablet 3  . tamsulosin (FLOMAX) 0.4 MG CAPS capsule take 1 capsule by mouth at bedtime 90 capsule 0  . tiZANidine (ZANAFLEX) 2 MG tablet Take 1 tablet (2 mg total) by mouth every 6 (six) hours as needed for muscle spasms. 40 tablet 1  . traMADol (ULTRAM) 50 MG tablet Take 1 tablet (50 mg total) by mouth every 6 (six) hours as needed. 28 tablet 0  . vitamin C (ASCORBIC ACID) 500 MG tablet Take 500 mg by mouth daily.     No current facility-administered medications on file prior to visit.    Review of Systems  Constitutional: Negative for other unusual diaphoresis or sweats HENT: Negative for ear discharge or swelling Eyes: Negative for other worsening visual disturbances Respiratory: Negative for stridor or other swelling  Gastrointestinal: Negative for worsening distension or other blood Genitourinary: Negative for retention or other urinary change Musculoskeletal: Negative for other MSK pain or swelling Skin: Negative for color change or other new lesions Neurological: Negative for worsening tremors and other numbness  Psychiatric/Behavioral: Negative for worsening agitation or other fatigue All other system neg per pt    Objective:   Physical Exam BP (!) 120/54 (BP Location: Left Arm, Patient Position: Sitting, Cuff Size: Normal)   Pulse 80   Temp 97.9 F (36.6 C) (Oral)   Ht 5\' 11"  (1.803 m)   Wt 203 lb (92.1 kg)   SpO2 97%   BMI 28.31 kg/m  VS noted,  Constitutional: Pt appears in NAD HENT: Head: NCAT.  Right Ear: External ear normal.  Left Ear: External ear normal.  Eyes: . Pupils are equal, round, and reactive to light. Conjunctivae and EOM are normal Nose: without d/c or deformity Neck: Neck supple. Gross normal ROM Bilat tm's with mild erythema.  Max sinus areas non tender.  Pharynx with mild  erythema, no exudate Cardiovascular: Normal rate and regular rhythm.   Pulmonary/Chest: Effort normal and breath sounds without rales or wheezing.  Abd:  Soft, NT, ND, + BS, no organomegaly Neurological: Pt is alert. At baseline orientation, motor grossly intact Skin: Skin is warm. No rashes, other new lesions, no LE edema Psychiatric: Pt behavior is normal without agitation  No other exam findings    Assessment & Plan:

## 2017-08-10 NOTE — Patient Instructions (Addendum)
Please take all new medication as prescribed - the singulair, and the zyrtec  You can also take Delsym OTC for cough, and/or Mucinex (or it's generic off brand) for congestion, and tylenol as needed for pain.  Please continue all other medications as before, and refills have been done if requested.  Please have the pharmacy call with any other refills you may need.  Please keep your appointments with your specialists as you may have planned

## 2017-08-29 ENCOUNTER — Encounter: Payer: Self-pay | Admitting: Internal Medicine

## 2017-08-29 ENCOUNTER — Ambulatory Visit (INDEPENDENT_AMBULATORY_CARE_PROVIDER_SITE_OTHER): Payer: PPO | Admitting: Internal Medicine

## 2017-08-29 ENCOUNTER — Other Ambulatory Visit (INDEPENDENT_AMBULATORY_CARE_PROVIDER_SITE_OTHER): Payer: PPO

## 2017-08-29 VITALS — BP 106/76 | HR 89 | Temp 98.3°F | Ht 71.0 in | Wt 203.0 lb

## 2017-08-29 DIAGNOSIS — Z0000 Encounter for general adult medical examination without abnormal findings: Secondary | ICD-10-CM

## 2017-08-29 DIAGNOSIS — R739 Hyperglycemia, unspecified: Secondary | ICD-10-CM

## 2017-08-29 DIAGNOSIS — J449 Chronic obstructive pulmonary disease, unspecified: Secondary | ICD-10-CM

## 2017-08-29 DIAGNOSIS — I10 Essential (primary) hypertension: Secondary | ICD-10-CM | POA: Diagnosis not present

## 2017-08-29 LAB — CBC WITH DIFFERENTIAL/PLATELET
BASOS PCT: 0.7 % (ref 0.0–3.0)
Basophils Absolute: 0.1 10*3/uL (ref 0.0–0.1)
EOS ABS: 0.5 10*3/uL (ref 0.0–0.7)
Eosinophils Relative: 6.1 % — ABNORMAL HIGH (ref 0.0–5.0)
HEMATOCRIT: 36 % — AB (ref 39.0–52.0)
HEMOGLOBIN: 12.1 g/dL — AB (ref 13.0–17.0)
Lymphocytes Relative: 26.4 % (ref 12.0–46.0)
Lymphs Abs: 2.2 10*3/uL (ref 0.7–4.0)
MCHC: 33.7 g/dL (ref 30.0–36.0)
MCV: 94.4 fl (ref 78.0–100.0)
MONO ABS: 0.6 10*3/uL (ref 0.1–1.0)
Monocytes Relative: 7.4 % (ref 3.0–12.0)
Neutro Abs: 4.9 10*3/uL (ref 1.4–7.7)
Neutrophils Relative %: 59.4 % (ref 43.0–77.0)
Platelets: 248 10*3/uL (ref 150.0–400.0)
RBC: 3.82 Mil/uL — AB (ref 4.22–5.81)
RDW: 13.1 % (ref 11.5–15.5)
WBC: 8.2 10*3/uL (ref 4.0–10.5)

## 2017-08-29 LAB — URINALYSIS, ROUTINE W REFLEX MICROSCOPIC
BILIRUBIN URINE: NEGATIVE
Hgb urine dipstick: NEGATIVE
Ketones, ur: NEGATIVE
LEUKOCYTES UA: NEGATIVE
Nitrite: NEGATIVE
PH: 6 (ref 5.0–8.0)
RBC / HPF: NONE SEEN (ref 0–?)
Specific Gravity, Urine: 1.02 (ref 1.000–1.030)
Total Protein, Urine: 30 — AB
Urine Glucose: NEGATIVE
Urobilinogen, UA: 0.2 (ref 0.0–1.0)

## 2017-08-29 LAB — HEPATIC FUNCTION PANEL
ALK PHOS: 64 U/L (ref 39–117)
ALT: 12 U/L (ref 0–53)
AST: 17 U/L (ref 0–37)
Albumin: 3.9 g/dL (ref 3.5–5.2)
BILIRUBIN DIRECT: 0.1 mg/dL (ref 0.0–0.3)
BILIRUBIN TOTAL: 0.6 mg/dL (ref 0.2–1.2)
TOTAL PROTEIN: 6.9 g/dL (ref 6.0–8.3)

## 2017-08-29 LAB — HEMOGLOBIN A1C: Hgb A1c MFr Bld: 6.4 % (ref 4.6–6.5)

## 2017-08-29 LAB — BASIC METABOLIC PANEL
BUN: 41 mg/dL — AB (ref 6–23)
CO2: 28 mEq/L (ref 19–32)
CREATININE: 2.36 mg/dL — AB (ref 0.40–1.50)
Calcium: 9.3 mg/dL (ref 8.4–10.5)
Chloride: 104 mEq/L (ref 96–112)
GFR: 27.86 mL/min — AB (ref >60.00)
GLUCOSE: 103 mg/dL — AB (ref 70–99)
Potassium: 4.9 mEq/L (ref 3.5–5.1)
Sodium: 140 mEq/L (ref 135–145)

## 2017-08-29 LAB — LIPID PANEL
CHOLESTEROL: 138 mg/dL (ref 0–200)
HDL: 47.8 mg/dL (ref >39.00)
NonHDL: 89.75
Total CHOL/HDL Ratio: 3
Triglycerides: 201 mg/dL — ABNORMAL HIGH (ref 0.0–149.0)
VLDL: 40.2 mg/dL — AB (ref 0.0–40.0)

## 2017-08-29 LAB — LDL CHOLESTEROL, DIRECT: LDL DIRECT: 68 mg/dL

## 2017-08-29 LAB — TSH: TSH: 3.6 u[IU]/mL (ref 0.35–4.50)

## 2017-08-29 MED ORDER — METHYLPREDNISOLONE ACETATE 80 MG/ML IJ SUSP
80.0000 mg | Freq: Once | INTRAMUSCULAR | Status: AC
  Administered 2017-08-29: 80 mg via INTRAMUSCULAR

## 2017-08-29 MED ORDER — PREDNISONE 10 MG PO TABS: ORAL_TABLET | ORAL | 0 refills | Status: DC

## 2017-08-29 MED ORDER — FLUTICASONE FUROATE-VILANTEROL 200-25 MCG/INH IN AEPB
1.0000 | INHALATION_SPRAY | Freq: Every day | RESPIRATORY_TRACT | 11 refills | Status: DC
Start: 2017-08-29 — End: 2018-01-19

## 2017-08-29 NOTE — Progress Notes (Signed)
Subjective:    Patient ID: Stephen Wilkins, male    DOB: 05/31/1927, 84 y.o.   MRN: [id number]  HPI  Here for wellness and f/u;  Overall doing ok;  Pt denies Chest pain, orthopnea, PND, worsening LE edema, palpitations, dizziness or syncope.  Pt denies neurological change such as new headache, facial or extremity weakness.  Pt denies polydipsia, polyuria, or low sugar symptoms. Pt states overall good compliance with treatment and medications, good tolerability, and has been trying to follow appropriate diet.  Pt denies worsening depressive symptoms, suicidal ideation or panic. No fever, night sweats, wt loss, loss of appetite, or other constitutional symptoms.  Pt states good ability with ADL's, has low fall risk, home safety reviewed and adequate, no other significant changes in hearing or vision, and not active with exercise. Also "My bronchitis has come back" and c/o 1 wk worsening significant scant prod cough, sob, doe and wheezing.  Pt admits for some reason he has become disenchanted with symbicort as he has with qvar before that, and asks for change, has list of med that are covered by his insurance.  Denies hyper or hypo thyroid symptoms such as voice, skin or hair change.  No other new complaints or interval hx Past Medical History:  Diagnosis Date  . ABNORMAL ELECTROCARDIOGRAM 06/21/2007  . ALLERGIC RHINITIS 03/23/2007  . Anxiety state, unspecified 09/06/2013  . ASTHMATIC BRONCHITIS, ACUTE 04/27/2007  . BENIGN PROSTATIC HYPERTROPHY 03/23/2007  . BRONCHITIS NOT SPECIFIED AS ACUTE OR CHRONIC 04/21/2007  . BURSITIS, RIGHT HIP 07/31/2008  . COPD (chronic obstructive pulmonary disease) ([place]) 04/19/2016  . Cough 01/29/2009  . Dementia 09/01/2010  . ERECTILE DYSFUNCTION 03/23/2007  . ERECTILE DYSFUNCTION, ORGANIC 04/17/2009  . Esophageal stricture   . FREQUENCY, URINARY 04/26/2010  . GERD 03/23/2007  . HIATAL HERNIA   . HYPERLIPIDEMIA 03/23/2007  . HYPERTENSION 03/23/2007  . MILD COGNITIVE  IMPAIRMENT SO STATED 05/19/2010  . NEPHROLITHIASIS, HX OF 03/23/2007  . PSA, INCREASED 06/27/2008  . RASH-NONVESICULAR 03/23/2007  . REACTIVE AIRWAY DISEASE 01/14/2010  . SCHATZKI'S RING   . SCIATICA, RIGHT 04/28/2008  . Unspecified hypothyroidism 09/06/2013  . Wheezing 04/17/2009   Past Surgical History:  Procedure Laterality Date  . TONSILLECTOMY      reports that he has never smoked. He has never used smokeless tobacco. He reports that he does not drink alcohol or use drugs. family history includes Asthma in his sister; Cancer in his unknown relative; Emphysema in his brother; Hypertension in his unknown relative; Lung cancer in his brother. Allergies  Allergen Reactions  . Atorvastatin     REACTION: myalgia  . Doxazosin Mesylate     REACTION: dizziness  . Hydrocodone-Homatropine Other (See Comments)    anxiety   Current Outpatient Medications on File Prior to Visit  Medication Sig Dispense Refill  . albuterol (PROVENTIL HFA;VENTOLIN HFA) 108 (90 Base) MCG/ACT inhaler Inhale 2 puffs into the lungs every 6 (six) hours as needed for wheezing. 1 Inhaler 11  . ALPRAZolam (XANAX) 0.5 MG tablet Take 1 tablet (0.5 mg total) by mouth daily as needed for anxiety. 30 tablet 2  . aspirin EC 81 MG tablet Take 1 tablet (81 mg total) by mouth daily. 90 tablet 11  . Azelastine HCl 0.15 % SOLN Place 2 sprays into both nostrils 2 (two) times daily. 30 mL 0  . budesonide-formoterol (SYMBICORT) 80-4.5 MCG/ACT inhaler Take 2 puffs first thing in am and then another 2 puffs about 12 hours later. (Patient taking differently: Take  2 puffs at bedtime) 1 Inhaler 11  . cetirizine (ZYRTEC) 10 MG tablet Take 1 tablet (10 mg total) by mouth daily. 30 tablet 11  . Cholecalciferol (VITAMIN D) 1000 UNITS capsule Take 1,000 Units by mouth daily.      . cyanocobalamin 1000 MCG tablet Take 100 mcg by mouth daily.    Marland Kitchen desoximetasone (TOPICORT) 0.25 % cream APPLY SPARINGLY to affected area twice a day if needed as  directed 30 g 0  . levothyroxine (SYNTHROID, LEVOTHROID) 125 MCG tablet Take 1 tablet (125 mcg total) by mouth daily. 90 tablet 3  . losartan (COZAAR) 100 MG tablet Take 1 tablet (100 mg total) by mouth daily. 90 tablet 3  . montelukast (SINGULAIR) 10 MG tablet Take 1 tablet (10 mg total) by mouth at bedtime. 30 tablet 11  . Multiple Vitamins-Minerals (CENTRUM SILVER PO) Take by mouth daily.      . polyethylene glycol powder (MIRALAX) powder Take 1/2 capful by mouth once daily 850 g 5  . rosuvastatin (CRESTOR) 10 MG tablet Take 1 tablet (10 mg total) by mouth daily. 90 tablet 3  . tamsulosin (FLOMAX) 0.4 MG CAPS capsule take 1 capsule by mouth at bedtime 90 capsule 0  . tiZANidine (ZANAFLEX) 2 MG tablet Take 1 tablet (2 mg total) by mouth every 6 (six) hours as needed for muscle spasms. 40 tablet 1  . traMADol (ULTRAM) 50 MG tablet Take 1 tablet (50 mg total) by mouth every 6 (six) hours as needed. 28 tablet 0  . vitamin C (ASCORBIC ACID) 500 MG tablet Take 500 mg by mouth daily.     No current facility-administered medications on file prior to visit.    Review of Systems  Constitutional: Negative for other unusual diaphoresis or sweats HENT: Negative for ear discharge or swelling Eyes: Negative for other worsening visual disturbances Respiratory: Negative for stridor or other swelling  Gastrointestinal: Negative for worsening distension or other blood Genitourinary: Negative for retention or other urinary change Musculoskeletal: Negative for other MSK pain or swelling Skin: Negative for color change or other new lesions Neurological: Negative for worsening tremors and other numbness  Psychiatric/Behavioral: Negative for worsening agitation or other fatigue All other system neg per pt    Objective:   Physical Exam BP 106/76   Pulse 89   Temp 98.3 F (36.8 C) (Oral)   Ht 5\' 11"  (1.803 m)   Wt 203 lb (92.1 kg)   SpO2 95%   BMI 28.31 kg/m  VS noted, non toxic Constitutional: Pt  appears in NAD HENT: Head: NCAT.  Right Ear: External ear normal.  Left Ear: External ear normal.  Eyes: . Pupils are equal, round, and reactive to light. Conjunctivae and EOM are normal Nose: without d/c or deformity Neck: Neck supple. Gross normal ROM Cardiovascular: Normal rate and regular rhythm.   Pulmonary/Chest: Effort normal and breath sounds decreased without rales but with bilat scattered wheezing, no accessory muscle use.  Abd:  Soft, NT, ND, + BS, no organomegaly Neurological: Pt is alert. At baseline orientation, motor grossly intact Skin: Skin is warm. No rashes, other new lesions, no LE edema Psychiatric: Pt behavior is normal without agitation  No other exam findings    Assessment & Plan:

## 2017-08-29 NOTE — Patient Instructions (Signed)
You had the steroid shot today  Please take all new medication as prescribed - the prednisone, and Breo inhaler (the insurance would not cover the [facility] that I mentioned)  Please continue all other medications as before, and refills have been done if requested.  Please have the pharmacy call with any other refills you may need.  Please continue your efforts at being more active, low cholesterol diet, and weight control.  You are otherwise up to date with prevention measures today.  Please keep your appointments with your specialists as you may have planned  Please go to the LAB in the Basement (turn left off the elevator) for the tests to be done today  You will be contacted by phone if any changes need to be made immediately.  Otherwise, you will receive a letter about your results with an explanation, but please check with MyChart first.  Please remember to sign up for MyChart if you have not done so, as this will be important to you in the future with finding out test results, communicating by private email, and scheduling acute appointments online when needed.  Please return in 6 months, or sooner if needed

## 2017-09-03 NOTE — Assessment & Plan Note (Signed)

## 2017-09-03 NOTE — Assessment & Plan Note (Signed)
stable overall by history and exam, recent data reviewed with pt, and pt to continue medical treatment as before,  to f/u any worsening symptoms or concerns Lab Results  Component Value Date   HGBA1C 6.4 08/29/2017

## 2017-09-03 NOTE — Assessment & Plan Note (Signed)
With mild exacerbation, for depomedrol IM 80, prednisone asd, and change of symbicort to breo per pt request

## 2017-09-03 NOTE — Assessment & Plan Note (Signed)
stable overall by history and exam, recent data reviewed with pt, and pt to continue medical treatment as before,  to f/u any worsening symptoms or concerns BP Readings from Last 3 Encounters:  08/29/17 106/76  08/10/17 (!) 120/54  07/31/17 132/84

## 2017-10-10 DIAGNOSIS — D631 Anemia in chronic kidney disease: Secondary | ICD-10-CM | POA: Diagnosis not present

## 2017-10-10 DIAGNOSIS — N400 Enlarged prostate without lower urinary tract symptoms: Secondary | ICD-10-CM | POA: Diagnosis not present

## 2017-10-10 DIAGNOSIS — I129 Hypertensive chronic kidney disease with stage 1 through stage 4 chronic kidney disease, or unspecified chronic kidney disease: Secondary | ICD-10-CM | POA: Diagnosis not present

## 2017-10-10 DIAGNOSIS — N184 Chronic kidney disease, stage 4 (severe): Secondary | ICD-10-CM | POA: Diagnosis not present

## 2017-10-26 ENCOUNTER — Other Ambulatory Visit: Payer: Self-pay | Admitting: Internal Medicine

## 2017-12-13 DIAGNOSIS — L218 Other seborrheic dermatitis: Secondary | ICD-10-CM | POA: Diagnosis not present

## 2018-01-08 DIAGNOSIS — N184 Chronic kidney disease, stage 4 (severe): Secondary | ICD-10-CM | POA: Diagnosis not present

## 2018-01-15 DIAGNOSIS — D631 Anemia in chronic kidney disease: Secondary | ICD-10-CM | POA: Diagnosis not present

## 2018-01-15 DIAGNOSIS — I129 Hypertensive chronic kidney disease with stage 1 through stage 4 chronic kidney disease, or unspecified chronic kidney disease: Secondary | ICD-10-CM | POA: Diagnosis not present

## 2018-01-15 DIAGNOSIS — N400 Enlarged prostate without lower urinary tract symptoms: Secondary | ICD-10-CM | POA: Diagnosis not present

## 2018-01-15 DIAGNOSIS — N184 Chronic kidney disease, stage 4 (severe): Secondary | ICD-10-CM | POA: Diagnosis not present

## 2018-01-17 DIAGNOSIS — L570 Actinic keratosis: Secondary | ICD-10-CM | POA: Diagnosis not present

## 2018-01-17 DIAGNOSIS — L218 Other seborrheic dermatitis: Secondary | ICD-10-CM | POA: Diagnosis not present

## 2018-01-19 ENCOUNTER — Other Ambulatory Visit (INDEPENDENT_AMBULATORY_CARE_PROVIDER_SITE_OTHER): Payer: PPO

## 2018-01-19 ENCOUNTER — Ambulatory Visit (INDEPENDENT_AMBULATORY_CARE_PROVIDER_SITE_OTHER): Payer: PPO | Admitting: Family

## 2018-01-19 ENCOUNTER — Encounter: Payer: Self-pay | Admitting: Family

## 2018-01-19 ENCOUNTER — Ambulatory Visit: Payer: Self-pay

## 2018-01-19 VITALS — BP 122/74 | HR 81 | Temp 98.1°F | Ht 71.0 in | Wt 202.0 lb

## 2018-01-19 DIAGNOSIS — Z23 Encounter for immunization: Secondary | ICD-10-CM

## 2018-01-19 DIAGNOSIS — R29818 Other symptoms and signs involving the nervous system: Secondary | ICD-10-CM

## 2018-01-19 DIAGNOSIS — R42 Dizziness and giddiness: Secondary | ICD-10-CM

## 2018-01-19 LAB — COMPREHENSIVE METABOLIC PANEL
ALBUMIN: 4.2 g/dL (ref 3.5–5.2)
ALT: 11 U/L (ref 0–53)
AST: 16 U/L (ref 0–37)
Alkaline Phosphatase: 64 U/L (ref 39–117)
BILIRUBIN TOTAL: 0.7 mg/dL (ref 0.2–1.2)
BUN: 30 mg/dL — ABNORMAL HIGH (ref 6–23)
CALCIUM: 9.6 mg/dL (ref 8.4–10.5)
CHLORIDE: 105 meq/L (ref 96–112)
CO2: 33 mEq/L — ABNORMAL HIGH (ref 19–32)
CREATININE: 1.91 mg/dL — AB (ref 0.40–1.50)
GFR: 35.53 mL/min — AB (ref >60.00)
Glucose, Bld: 119 mg/dL — ABNORMAL HIGH (ref 70–99)
Potassium: 4.2 mEq/L (ref 3.5–5.1)
Sodium: 143 mEq/L (ref 135–145)
Total Protein: 7 g/dL (ref 6.0–8.3)

## 2018-01-19 LAB — CBC WITH DIFFERENTIAL/PLATELET
BASOS ABS: 0 10*3/uL (ref 0.0–0.1)
Basophils Relative: 0.7 % (ref 0.0–3.0)
EOS ABS: 0.2 10*3/uL (ref 0.0–0.7)
Eosinophils Relative: 2.8 % (ref 0.0–5.0)
HEMATOCRIT: 36.7 % — AB (ref 39.0–52.0)
HEMOGLOBIN: 12.5 g/dL — AB (ref 13.0–17.0)
LYMPHS PCT: 26.2 % (ref 12.0–46.0)
Lymphs Abs: 2 10*3/uL (ref 0.7–4.0)
MCHC: 34 g/dL (ref 30.0–36.0)
MCV: 93.5 fl (ref 78.0–100.0)
Monocytes Absolute: 0.6 10*3/uL (ref 0.1–1.0)
Monocytes Relative: 7.8 % (ref 3.0–12.0)
Neutro Abs: 4.7 10*3/uL (ref 1.4–7.7)
Neutrophils Relative %: 62.5 % (ref 43.0–77.0)
Platelets: 241 10*3/uL (ref 150.0–400.0)
RBC: 3.93 Mil/uL — ABNORMAL LOW (ref 4.22–5.81)
RDW: 12.1 % (ref 11.5–15.5)
WBC: 7.5 10*3/uL (ref 4.0–10.5)

## 2018-01-19 LAB — TSH: TSH: 0.1 u[IU]/mL — ABNORMAL LOW (ref 0.35–4.50)

## 2018-01-19 MED ORDER — MECLIZINE HCL 12.5 MG PO TABS: 12.5000 mg | ORAL_TABLET | Freq: Three times a day (TID) | ORAL | 0 refills | Status: DC | PRN

## 2018-01-19 NOTE — Progress Notes (Signed)
Stephen Wilkins is a 84 y.o. male with the following history as recorded in EpicCare:  Patient Active Problem List   Diagnosis Date Noted  . Low back pain 07/11/2017  . Non compliance w medication regimen 08/23/2016  . Peripheral neuropathy 08/23/2016  . COPD (chronic obstructive pulmonary disease) ([place]) 04/19/2016  . Hyperglycemia 02/10/2016  . Preventative health care 07/29/2015  . CAP (community acquired pneumonia) 05/30/2015  . Abnormal CXR 05/30/2015  . Cough 04/22/2015  . Asthma with exacerbation 04/22/2015  . Urine abnormality 04/22/2015  . Abdominal pain 09/11/2014  . Abnormal liver function test 09/11/2014  . Dysphagia 07/29/2014  . Anxiety state 09/06/2013  . CKD (chronic kidney disease) 09/06/2013  . Hypothyroidism 09/06/2013  . Rash and nonspecific skin eruption 02/27/2013  . Dizziness and giddiness 01/04/2013  . Abnormal TSH 06/04/2012  . Gross hematuria 01/09/2012  . Bruising 06/05/2011  . Dementia ([place]) 09/01/2010  . Cough variant asthma vs UACS  01/14/2010  . ERECTILE DYSFUNCTION, ORGANIC 04/17/2009  . FATIGUE 06/27/2008  . PSA, INCREASED 06/27/2008  . SCIATICA, RIGHT 04/28/2008  . SCHATZKI'S RING 07/18/2007  . HIATAL HERNIA 07/18/2007  . ABNORMAL ELECTROCARDIOGRAM 06/21/2007  . Hyperlipidemia 03/23/2007  . Essential hypertension 03/23/2007  . Allergic rhinitis 03/23/2007  . GERD 03/23/2007  . BENIGN PROSTATIC HYPERTROPHY 03/23/2007  . NEPHROLITHIASIS, HX OF 03/23/2007    Current Outpatient Medications  Medication Sig Dispense Refill  . ALPRAZolam (XANAX) 0.5 MG tablet Take 1 tablet (0.5 mg total) by mouth daily as needed for anxiety. 30 tablet 2  . aspirin EC 81 MG tablet Take 1 tablet (81 mg total) by mouth daily. 90 tablet 11  . Azelastine HCl 0.15 % SOLN Place 2 sprays into both nostrils 2 (two) times daily. 30 mL 0  . budesonide-formoterol (SYMBICORT) 80-4.5 MCG/ACT inhaler Take 2 puffs first thing in am and then another 2 puffs about 12 hours later.  (Patient taking differently: Take 2 puffs at bedtime) 1 Inhaler 11  . cetirizine (ZYRTEC) 10 MG tablet Take 1 tablet (10 mg total) by mouth daily. 30 tablet 11  . Cholecalciferol (VITAMIN D) 1000 UNITS capsule Take 1,000 Units by mouth daily.      . cyanocobalamin 1000 MCG tablet Take 100 mcg by mouth daily.    Marland Kitchen desoximetasone (TOPICORT) 0.25 % cream APPLY SPARINGLY to affected area twice a day if needed as directed 30 g 0  . levothyroxine (SYNTHROID, LEVOTHROID) 125 MCG tablet Take 1 tablet (125 mcg total) by mouth daily. 90 tablet 3  . losartan (COZAAR) 100 MG tablet Take 1 tablet (100 mg total) by mouth daily. 90 tablet 3  . montelukast (SINGULAIR) 10 MG tablet Take 1 tablet (10 mg total) by mouth at bedtime. 30 tablet 11  . Multiple Vitamins-Minerals (CENTRUM SILVER PO) Take by mouth daily.      . polyethylene glycol powder (MIRALAX) powder Take 1/2 capful by mouth once daily 850 g 5  . rosuvastatin (CRESTOR) 10 MG tablet TAKE 1 TABLET BY MOUTH EVERY DAY 90 tablet 0  . tamsulosin (FLOMAX) 0.4 MG CAPS capsule take 1 capsule by mouth at bedtime 90 capsule 0  . tiZANidine (ZANAFLEX) 2 MG tablet Take 1 tablet (2 mg total) by mouth every 6 (six) hours as needed for muscle spasms. 40 tablet 1  . traMADol (ULTRAM) 50 MG tablet Take 1 tablet (50 mg total) by mouth every 6 (six) hours as needed. 28 tablet 0  . vitamin C (ASCORBIC ACID) 500 MG tablet Take 500 mg  by mouth daily.    Marland Kitchen albuterol (PROVENTIL HFA;VENTOLIN HFA) 108 (90 Base) MCG/ACT inhaler Inhale 2 puffs into the lungs every 6 (six) hours as needed for wheezing. 1 Inhaler 11  . meclizine (ANTIVERT) 12.5 MG tablet Take 1 tablet (12.5 mg total) by mouth 3 (three) times daily as needed for dizziness. 30 tablet 0   No current facility-administered medications for this visit.     Allergies: Atorvastatin; Doxazosin mesylate; and Hydrocodone-homatropine  Past Medical History:  Diagnosis Date  . ABNORMAL ELECTROCARDIOGRAM 06/21/2007  . ALLERGIC  RHINITIS 03/23/2007  . Anxiety state, unspecified 09/06/2013  . ASTHMATIC BRONCHITIS, ACUTE 04/27/2007  . BENIGN PROSTATIC HYPERTROPHY 03/23/2007  . BRONCHITIS NOT SPECIFIED AS ACUTE OR CHRONIC 04/21/2007  . BURSITIS, RIGHT HIP 07/31/2008  . COPD (chronic obstructive pulmonary disease) ([place]) 04/19/2016  . Cough 01/29/2009  . Dementia ([place]) 09/01/2010  . ERECTILE DYSFUNCTION 03/23/2007  . ERECTILE DYSFUNCTION, ORGANIC 04/17/2009  . Esophageal stricture   . FREQUENCY, URINARY 04/26/2010  . GERD 03/23/2007  . HIATAL HERNIA   . HYPERLIPIDEMIA 03/23/2007  . HYPERTENSION 03/23/2007  . MILD COGNITIVE IMPAIRMENT SO STATED 05/19/2010  . NEPHROLITHIASIS, HX OF 03/23/2007  . PSA, INCREASED 06/27/2008  . RASH-NONVESICULAR 03/23/2007  . REACTIVE AIRWAY DISEASE 01/14/2010  . SCHATZKI'S RING   . SCIATICA, RIGHT 04/28/2008  . Unspecified hypothyroidism 09/06/2013  . Wheezing 04/17/2009    Past Surgical History:  Procedure Laterality Date  . TONSILLECTOMY      Family History  Problem Relation Age of Onset  . Lung cancer Brother        smoked  . Cancer Unknown        lung cancer  . Hypertension Unknown   . Emphysema Brother        smoked  . Asthma Sister     Social History   Tobacco Use  . Smoking status: Never Smoker  . Smokeless tobacco: Never Used  . Tobacco comment: tried smoking in HS  Substance Use Topics  . Alcohol use: No    Subjective:  Started today with sudden onset of feeling dizzy today; does feel that the room is spinning around him; has felt off balance today; does note that had similar episode approximately 3 weeks ago and actually fell; wife is present today and notes that symptoms have actually been occurring for "months." no chest pain, no shortness of breath; does not feel his heart beating irregularly; no chest pain on exertion; denies any nausea or vomiting;     Objective:  Vitals:   01/19/18 1516 01/19/18 1519  BP: 124/66 122/74  Pulse: 81   Temp: 98.1 F (36.7 C)    TempSrc: Oral   SpO2: 96%   Weight: 202 lb (91.6 kg)   Height: _0  (1.803 m)     General: Well developed, well nourished, in no acute distress  Skin : Warm and dry.  Head: Normocephalic and atraumatic  Eyes: Sclera and conjunctiva clear; pupils round and reactive to light; extraocular movements intact  Ears: External normal; canals clear; tympanic membranes normal  Oropharynx: Pink, supple. No suspicious lesions  Neck: Supple without thyromegaly, adenopathy  Lungs: Respirations unlabored; clear to auscultation bilaterally without wheeze, rales, rhonchi  CVS exam: normal rate and regular rhythm.  Abdomen: Soft; nontender; nondistended; normoactive bowel sounds; no masses or hepatosplenomegaly  Musculoskeletal: No deformities; no active joint inflammation  Extremities: No edema, cyanosis, clubbing  Vessels: Symmetric bilaterally; no carotid bruits noted Neurologic: Alert and oriented; speech intact; face symmetrical; moves all  extremities well; CNII-XII intact without focal deficit  Assessment:  1. Dizziness   2. Other symptoms and signs involving the nervous system     Plan:  Suspect inner ear source; check EKG- no acute changes seen; update labs today including CBC, CMP, TSH; will check head CT and carotid dopplers; if no other source noted, to consider cardiology exam for holter and/or neurology evaluation; follow-up to be determined.  Flu shot given as requested.  No follow-ups on file.  Orders Placed This Encounter  Procedures  . CT Head Wo Contrast    Standing Status:   Future    Standing Expiration Date:   04/22/2019    Order Specific Question:   Preferred imaging location?    Answer:   [facility]    Order Specific Question:   Radiology Contrast Protocol - do NOT remove file path    Answer:   [email or web address]  . US Carotid Duplex Bilateral    Standing Status:   Future    Standing Expiration Date:   03/22/2019    Order Specific  Question:   Reason for exam:    Answer:   dizziness    Order Specific Question:   Preferred imaging location?    Answer:   [facility]  . CBC w/Diff    Standing Status:   Future    Standing Expiration Date:   01/19/2019  . Comp Met (CMET)    Standing Status:   Future    Standing Expiration Date:   01/19/2019  . TSH    Standing Status:   Future    Standing Expiration Date:   01/19/2019  . EKG 12-Lead    Requested Prescriptions   Signed Prescriptions Disp Refills  . meclizine (ANTIVERT) 12.5 MG tablet 30 tablet 0    Sig: Take 1 tablet (12.5 mg total) by mouth 3 (three) times daily as needed for dizziness.

## 2018-01-19 NOTE — Addendum Note (Signed)
Addended by: Karren Cobble on: 01/19/2018 04:01 PM   Modules accepted: Orders

## 2018-01-19 NOTE — Telephone Encounter (Signed)
Returned call to pt.  C/o onset of dizziness this morning.  Described as "absolute imbalance."  Stated the room was spinning for a little while when he 1st go up today.  Reported needing to hold on to something to keep from falling.  Denied blurred vision, headache, weakness of extremities, chest pain, or shortness of breath.  Reported a similar episode occurred about one month ago, and he had a fall at that time, but was never evaluated for this.  Reported he has some dizziness when changing position from laying to Pt. reported he was not able to check his pulse at this time, when asked to do this.  Reported the only new medication he is on is Betamethasone cream to scalp daily/ prn, and Hydrocortisone cream to left brow BID; recently prescribed per Dermatology.  Pt. is not sure if he drinks fluids adequately.  Reported he is urinating his normal amts.   Advised he should be evaluated for the dizziness to determine underlying cause and also due to risk of falling.  Appt. sched. today with NP at PCP office.  Care advice given per protocol. Verb. Understanding.  Agreed to have his wife drive him to the appt.                Reason for Disposition . [1] MODERATE dizziness (e.g., interferes with normal activities) AND [2] has NOT been evaluated by physician for this  (Exception: dizziness caused by heat exposure, sudden standing, or poor fluid intake)  Answer Assessment - Initial Assessment Questions 1. DESCRIPTION: "Describe your dizziness."     "absolutely imbalance" 2. LIGHTHEADED: "Do you feel lightheaded?" (e.g., somewhat faint, woozy, weak upon standing)    Denied feeling faint 3. VERTIGO: "Do you feel like either you or the room is spinning or tilting?" (i.e. vertigo)    Felt the room was spinning when he 1st got up today; has improved 4. SEVERITY: "How bad is it?"  "Do you feel like you are going to faint?" "Can you stand and walk?"   - MILD - walking normally   - MODERATE - interferes with  normal activities (e.g., work, school)    - SEVERE - unable to stand, requires support to walk, feels like passing out now.      Moderate; feels like he has to grab on to something to keep his balance.  5. ONSET:  "When did the dizziness begin?"     Started this morning when he got up; it has cleared up some since the onset this morning 6. AGGRAVATING FACTORS: "Does anything make it worse?" (e.g., standing, change in head position)     Changing position from laying or sitting to standing 7. HEART RATE: "Can you tell me your heart rate?" "How many beats in 15 seconds?"  (Note: not all patients can do this)       Denied any sensation of irregular or fast heart beat ; unable to check pulse 8. CAUSE: "What do you think is causing the dizziness?"     Unsure; stated similar to an episode one month ago  9. RECURRENT SYMPTOM: "Have you had dizziness before?" If so, ask: "When was the last time?" "What happened that time?"     Approx. One month ago ; it went away on its own 10. OTHER SYMPTOMS: "Do you have any other symptoms?" (e.g., fever, chest pain, vomiting, diarrhea, bleeding)       Denied blurred vision, headache, weakness of extremities, chest pain, or shortness of breath.  Protocols  used: Ned Grace

## 2018-01-22 ENCOUNTER — Other Ambulatory Visit: Payer: Self-pay | Admitting: Family

## 2018-01-22 MED ORDER — LEVOTHYROXINE SODIUM 112 MCG PO TABS: 112.0000 ug | ORAL_TABLET | Freq: Every day | ORAL | 0 refills | Status: DC

## 2018-01-30 ENCOUNTER — Ambulatory Visit (INDEPENDENT_AMBULATORY_CARE_PROVIDER_SITE_OTHER)
Admission: RE | Admit: 2018-01-30 | Discharge: 2018-01-30 | Disposition: A | Discharge status: Home / Self Care | Payer: PPO | Source: Ambulatory Visit | Attending: Family | Admitting: Family

## 2018-01-30 DIAGNOSIS — R29818 Other symptoms and signs involving the nervous system: Secondary | ICD-10-CM | POA: Diagnosis not present

## 2018-01-30 DIAGNOSIS — G9389 Other specified disorders of brain: Secondary | ICD-10-CM | POA: Diagnosis not present

## 2018-01-31 ENCOUNTER — Other Ambulatory Visit: Payer: Self-pay | Admitting: Internal Medicine

## 2018-02-01 ENCOUNTER — Inpatient Hospital Stay: Admission: RE | Admit: 2018-02-01 | Payer: PPO | Source: Ambulatory Visit

## 2018-02-01 DIAGNOSIS — Z961 Presence of intraocular lens: Secondary | ICD-10-CM | POA: Diagnosis not present

## 2018-02-01 DIAGNOSIS — H04123 Dry eye syndrome of bilateral lacrimal glands: Secondary | ICD-10-CM | POA: Diagnosis not present

## 2018-02-01 DIAGNOSIS — H524 Presbyopia: Secondary | ICD-10-CM | POA: Diagnosis not present

## 2018-02-01 DIAGNOSIS — H52223 Regular astigmatism, bilateral: Secondary | ICD-10-CM | POA: Diagnosis not present

## 2018-02-01 DIAGNOSIS — H1859 Other hereditary corneal dystrophies: Secondary | ICD-10-CM | POA: Diagnosis not present

## 2018-02-01 DIAGNOSIS — H5203 Hypermetropia, bilateral: Secondary | ICD-10-CM | POA: Diagnosis not present

## 2018-02-01 DIAGNOSIS — H16223 Keratoconjunctivitis sicca, not specified as Sjogren's, bilateral: Secondary | ICD-10-CM | POA: Diagnosis not present

## 2018-02-01 DIAGNOSIS — H43813 Vitreous degeneration, bilateral: Secondary | ICD-10-CM | POA: Diagnosis not present

## 2018-02-01 DIAGNOSIS — H26492 Other secondary cataract, left eye: Secondary | ICD-10-CM | POA: Diagnosis not present

## 2018-02-12 DIAGNOSIS — H26492 Other secondary cataract, left eye: Secondary | ICD-10-CM | POA: Diagnosis not present

## 2018-02-14 ENCOUNTER — Other Ambulatory Visit: Payer: PPO

## 2018-02-15 ENCOUNTER — Ambulatory Visit
Admission: RE | Admit: 2018-02-15 | Discharge: 2018-02-15 | Disposition: A | Discharge status: Home / Self Care | Payer: PPO | Source: Ambulatory Visit | Attending: Family | Admitting: Family

## 2018-02-15 DIAGNOSIS — I6523 Occlusion and stenosis of bilateral carotid arteries: Secondary | ICD-10-CM | POA: Diagnosis not present

## 2018-02-15 DIAGNOSIS — R42 Dizziness and giddiness: Secondary | ICD-10-CM

## 2018-02-19 DIAGNOSIS — L819 Disorder of pigmentation, unspecified: Secondary | ICD-10-CM | POA: Diagnosis not present

## 2018-02-19 DIAGNOSIS — L218 Other seborrheic dermatitis: Secondary | ICD-10-CM | POA: Diagnosis not present

## 2018-02-19 DIAGNOSIS — L570 Actinic keratosis: Secondary | ICD-10-CM | POA: Diagnosis not present

## 2018-02-19 NOTE — Telephone Encounter (Signed)
Copied from [place] [id number]. Topic: Quick Communication - See Telephone Encounter >> Feb 19, 2018  4:30 PM Vernona Rieger wrote: CRM for notification. See Telephone encounter for: 02/19/18.  Patient's wife called back and wanted to let Dr Jenny Reichmann and Jodi Mourning know that he is doing well on his levothyroxine (SYNTHROID, LEVOTHROID) 112 MCG tablet. She said that she does not need a call back. Thanks

## 2018-02-20 DIAGNOSIS — H5201 Hypermetropia, right eye: Secondary | ICD-10-CM | POA: Diagnosis not present

## 2018-02-20 DIAGNOSIS — H524 Presbyopia: Secondary | ICD-10-CM | POA: Diagnosis not present

## 2018-02-20 DIAGNOSIS — H52203 Unspecified astigmatism, bilateral: Secondary | ICD-10-CM | POA: Diagnosis not present

## 2018-02-20 DIAGNOSIS — H5212 Myopia, left eye: Secondary | ICD-10-CM | POA: Diagnosis not present

## 2018-03-10 ENCOUNTER — Other Ambulatory Visit: Payer: Self-pay | Admitting: Internal Medicine

## 2018-03-12 NOTE — Telephone Encounter (Signed)
Done erx 

## 2018-04-09 ENCOUNTER — Ambulatory Visit: Payer: Self-pay

## 2018-04-09 ENCOUNTER — Encounter: Payer: Self-pay | Admitting: Internal Medicine

## 2018-04-09 ENCOUNTER — Ambulatory Visit (INDEPENDENT_AMBULATORY_CARE_PROVIDER_SITE_OTHER): Payer: PPO | Admitting: Internal Medicine

## 2018-04-09 VITALS — BP 122/72 | HR 85 | Temp 98.3°F | Ht 71.0 in | Wt 205.0 lb

## 2018-04-09 DIAGNOSIS — I10 Essential (primary) hypertension: Secondary | ICD-10-CM

## 2018-04-09 DIAGNOSIS — R739 Hyperglycemia, unspecified: Secondary | ICD-10-CM | POA: Diagnosis not present

## 2018-04-09 DIAGNOSIS — J4541 Moderate persistent asthma with (acute) exacerbation: Secondary | ICD-10-CM

## 2018-04-09 MED ORDER — METHYLPREDNISOLONE ACETATE 80 MG/ML IJ SUSP
80.0000 mg | Freq: Once | INTRAMUSCULAR | Status: AC
  Administered 2018-04-09: 80 mg via INTRAMUSCULAR

## 2018-04-09 MED ORDER — PREDNISONE 10 MG PO TABS: ORAL_TABLET | ORAL | 0 refills | Status: DC

## 2018-04-09 NOTE — Telephone Encounter (Signed)
Incoming call from Patient with a complaint of wheezing, coughing,  during the night. Patient states that, he cant sleep at night,.  Describes it as rattling.  States it is worse when lying down.  States that it comes and goes. Rates it as severe.  It is recurrent.  Denies  History of lung  Disease.  Doesn't know what is causing  it.  Patient states that he has ran a temperature. Not sure what it was.  No thermometer.   Denies travelling out of the  Country.  Patient scheduled  For an appointment  Today 04/09/18 with Dr.  Cathlean Cower @ [facility] at 4:20pm.  Patient is to arrive @ 4:05 pm.  Voiced understanding.  Reviewed care Advice, Patient voiced understanding.  Reason for Disposition . [1] MODERATE longstanding difficulty breathing (e.g., speaks in phrases, SOB even at rest, pulse 100-120) AND [2] SAME as normal  Answer Assessment - Initial Assessment Questions 1. RESPIRATORY STATUS: "Describe your breathing?" (e.g., wheezing, shortness of breath, unable to speak, severe coughing)      Shortness of breath, rattling coughing 2. ONSET: "When did this breathing problem begin?"      Middle of December  3. PATTERN "Does the difficult breathing come and go, or has it been constant since it started?"      Come and go 4. SEVERITY: "How bad is your breathing?" (e.g., mild, moderate, severe)    - MILD: No SOB at rest, mild SOB with walking, speaks normally in sentences, can lay down, no retractions, pulse < 100.    - MODERATE: SOB at rest, SOB with minimal exertion and prefers to sit, cannot lie down flat, speaks in phrases, mild retractions, audible wheezing, pulse 100-120.    - SEVERE: Very SOB at rest, speaks in single words, struggling to breathe, sitting hunched forward, retractions, pulse > 120      severe 5. RECURRENT SYMPTOM: "Have you had difficulty breathing before?" If so, ask: "When was the last time?" and "What happened that time?"      yes 6. CARDIAC HISTORY: "Do you have any history of heart  disease?" (e.g., heart attack, angina, bypass surgery, angioplasty)      no 7. LUNG HISTORY: "Do you have any history of lung disease?"  (e.g., pulmonary embolus, asthma, emphysema)     ? 8. CAUSE: "What do you think is causing the breathing problem?"      Really don't 9. OTHER SYMPTOMS: "Do you have any other symptoms? (e.g., dizziness, runny nose, cough, chest pain, fever)     feverish 10. PREGNANCY: "Is there any chance you are pregnant?" "When was your last menstrual period?"       na 11. TRAVEL: "Have you traveled out of the country in the last month?" (e.g., travel history, exposures)       no  Protocols used: BREATHING DIFFICULTY-A-AH

## 2018-04-09 NOTE — Patient Instructions (Signed)
You had the steroid shot today  Please take all new medication as prescribed - the prednisone  Please continue all other medications as before, and refills have been done if requested.  Please have the pharmacy call with any other refills you may need.  Please continue your efforts at being more active, low cholesterol diet, and weight control.  Please keep your appointments with your specialists as you may have planned

## 2018-04-09 NOTE — Assessment & Plan Note (Signed)
stable overall by history and exam, recent data reviewed with pt, and pt to continue medical treatment as before,  to f/u any worsening symptoms or concerns  

## 2018-04-09 NOTE — Progress Notes (Signed)
Subjective:    Patient ID: Stephen Wilkins, male    DOB: 01/26/1928, 85 y.o.   MRN: [id number]  HPI  Here to f/u with c/o 3-4 days onset acute onset mild to mod wheezing, sob/doe without HA, fever, St, prod cough and Pt denies chest pain, orthopnea, PND, increased LE swelling, palpitations, dizziness or syncope.  No sick contacts.  Admits to not using the symbicort on a regular basis.  Pt denies new neurological symptoms such as new headache, or facial or extremity weakness or numbness   Pt denies polydipsia, polyuria, or low sugar symptoms such as weakness or confusion improved with po intake.  Pt states overall good compliance with meds, trying to follow lower cholesterol, diabetic diet, wt overall stable but little exercise however.    Dementia overall stable symptomatically with gr and not assoc with behavioral changes such as hallucinations, paranoia, or agitation. Past Medical History:  Diagnosis Date  . ABNORMAL ELECTROCARDIOGRAM 06/21/2007  . ALLERGIC RHINITIS 03/23/2007  . Anxiety state, unspecified 09/06/2013  . ASTHMATIC BRONCHITIS, ACUTE 04/27/2007  . BENIGN PROSTATIC HYPERTROPHY 03/23/2007  . BRONCHITIS NOT SPECIFIED AS ACUTE OR CHRONIC 04/21/2007  . BURSITIS, RIGHT HIP 07/31/2008  . COPD (chronic obstructive pulmonary disease) ([place]) 04/19/2016  . Cough 01/29/2009  . Dementia ([place]) 09/01/2010  . ERECTILE DYSFUNCTION 03/23/2007  . ERECTILE DYSFUNCTION, ORGANIC 04/17/2009  . Esophageal stricture   . FREQUENCY, URINARY 04/26/2010  . GERD 03/23/2007  . HIATAL HERNIA   . HYPERLIPIDEMIA 03/23/2007  . HYPERTENSION 03/23/2007  . MILD COGNITIVE IMPAIRMENT SO STATED 05/19/2010  . NEPHROLITHIASIS, HX OF 03/23/2007  . PSA, INCREASED 06/27/2008  . RASH-NONVESICULAR 03/23/2007  . REACTIVE AIRWAY DISEASE 01/14/2010  . SCHATZKI'S RING   . SCIATICA, RIGHT 04/28/2008  . Unspecified hypothyroidism 09/06/2013  . Wheezing 04/17/2009   Past Surgical History:  Procedure Laterality Date  . TONSILLECTOMY        reports that he has never smoked. He has never used smokeless tobacco. He reports that he does not drink alcohol or use drugs. family history includes Asthma in his sister; Cancer in his unknown relative; Emphysema in his brother; Hypertension in his unknown relative; Lung cancer in his brother. Allergies  Allergen Reactions  . Atorvastatin     REACTION: myalgia  . Doxazosin Mesylate     REACTION: dizziness  . Hydrocodone-Homatropine Other (See Comments)    anxiety   Current Outpatient Medications on File Prior to Visit  Medication Sig Dispense Refill  . ALPRAZolam (XANAX) 0.5 MG tablet Take 1 tablet (0.5 mg total) by mouth daily as needed for anxiety. 30 tablet 2  . aspirin EC 81 MG tablet Take 1 tablet (81 mg total) by mouth daily. 90 tablet 11  . Azelastine HCl 0.15 % SOLN Place 2 sprays into both nostrils 2 (two) times daily. 30 mL 0  . budesonide-formoterol (SYMBICORT) 80-4.5 MCG/ACT inhaler Take 2 puffs first thing in am and then another 2 puffs about 12 hours later. (Patient taking differently: Take 2 puffs at bedtime) 1 Inhaler 11  . cetirizine (ZYRTEC) 10 MG tablet Take 1 tablet (10 mg total) by mouth daily. 30 tablet 11  . Cholecalciferol (VITAMIN D) 1000 UNITS capsule Take 1,000 Units by mouth daily.      . cyanocobalamin 1000 MCG tablet Take 100 mcg by mouth daily.    Marland Kitchen desoximetasone (TOPICORT) 0.25 % cream APPLY SPARINGLY TO AFFECTED AREA TWICE A DAY IF NEEDED AS DIRECTED 15 g 1  . levothyroxine (SYNTHROID, LEVOTHROID) 112 MCG  tablet Take 1 tablet (112 mcg total) by mouth daily. 90 tablet 0  . losartan (COZAAR) 100 MG tablet TAKE 1 TABLET BY MOUTH ONCE DAILY 90 tablet 0  . meclizine (ANTIVERT) 12.5 MG tablet Take 1 tablet (12.5 mg total) by mouth 3 (three) times daily as needed for dizziness. 30 tablet 0  . montelukast (SINGULAIR) 10 MG tablet Take 1 tablet (10 mg total) by mouth at bedtime. 30 tablet 11  . Multiple Vitamins-Minerals (CENTRUM SILVER PO) Take by mouth  daily.      . polyethylene glycol powder (MIRALAX) powder Take 1/2 capful by mouth once daily 850 g 5  . rosuvastatin (CRESTOR) 10 MG tablet TAKE 1 TABLET BY MOUTH EVERY DAY 90 tablet 0  . tamsulosin (FLOMAX) 0.4 MG CAPS capsule take 1 capsule by mouth at bedtime 90 capsule 0  . tiZANidine (ZANAFLEX) 2 MG tablet Take 1 tablet (2 mg total) by mouth every 6 (six) hours as needed for muscle spasms. 40 tablet 1  . traMADol (ULTRAM) 50 MG tablet Take 1 tablet (50 mg total) by mouth every 6 (six) hours as needed. 28 tablet 0  . vitamin C (ASCORBIC ACID) 500 MG tablet Take 500 mg by mouth daily.    Marland Kitchen albuterol (PROVENTIL HFA;VENTOLIN HFA) 108 (90 Base) MCG/ACT inhaler Inhale 2 puffs into the lungs every 6 (six) hours as needed for wheezing. 1 Inhaler 11   No current facility-administered medications on file prior to visit.    Review of Systems  Constitutional: Negative for other unusual diaphoresis or sweats HENT: Negative for ear discharge or swelling Eyes: Negative for other worsening visual disturbances Respiratory: Negative for stridor or other swelling  Gastrointestinal: Negative for worsening distension or other blood Genitourinary: Negative for retention or other urinary change Musculoskeletal: Negative for other MSK pain or swelling Skin: Negative for color change or other new lesions Neurological: Negative for worsening tremors and other numbness  Psychiatric/Behavioral: Negative for worsening agitation or other fatigue All other system neg per pt    Objective:   Physical Exam BP 122/72   Pulse 85   Temp 98.3 F (36.8 C) (Oral)   Ht 5\' 11"  (1.803 m)   Wt 205 lb (93 kg)   SpO2 94%   BMI 28.59 kg/m  VS noted, not ill appearing Constitutional: Pt appears in NAD HENT: Head: NCAT.  Bilat tm's with mild erythema.  Max sinus areas non tender.  Pharynx with mild erythema, no exudate Right Ear: External ear normal.  Left Ear: External ear normal.  Eyes: . Pupils are equal, round,  and reactive to light. Conjunctivae and EOM are normal Nose: without d/c or deformity Neck: Neck supple. Gross normal ROM Cardiovascular: Normal rate and regular rhythm.   Pulmonary/Chest: Effort normal and breath sounds decreased without rales but with few bilat wheezing.  Neurological: Pt is alert. At baseline orientation, motor grossly intact Skin: Skin is warm. No rashes, other new lesions, no LE edema Psychiatric: Pt behavior is normal without agitation  No other exam findings Lab Results  Component Value Date   WBC 7.5 01/19/2018   HGB 12.5 (L) 01/19/2018   HCT 36.7 (L) 01/19/2018   PLT 241.0 01/19/2018   GLUCOSE 119 (H) 01/19/2018   CHOL 138 08/29/2017   TRIG 201.0 (H) 08/29/2017   HDL 47.80 08/29/2017   LDLDIRECT 68.0 08/29/2017   LDLCALC 119 (H) 07/21/2016   ALT 11 01/19/2018   AST 16 01/19/2018   NA 143 01/19/2018   K 4.2 01/19/2018  CL 105 01/19/2018   CREATININE 1.91 (H) 01/19/2018   BUN 30 (H) 01/19/2018   CO2 33 (H) 01/19/2018   TSH 0.10 (L) 01/19/2018   PSA 3.94 03/12/2014   HGBA1C 6.4 08/29/2017        Assessment & Plan:

## 2018-04-09 NOTE — Assessment & Plan Note (Signed)
Mild to mod, for depomedrol IM 80, predpac asd, to take symbicort regularly not prn,  to f/u any worsening symptoms or concerns

## 2018-04-28 ENCOUNTER — Other Ambulatory Visit: Payer: Self-pay | Admitting: Internal Medicine

## 2018-05-04 ENCOUNTER — Other Ambulatory Visit: Payer: Self-pay | Admitting: Internal Medicine

## 2018-07-06 ENCOUNTER — Telehealth: Payer: Self-pay

## 2018-07-06 MED ORDER — TAMSULOSIN HCL 0.4 MG PO CAPS: 0.4000 mg | ORAL_CAPSULE | Freq: Every day | ORAL | 0 refills | Status: DC

## 2018-07-06 NOTE — Telephone Encounter (Signed)
90-day refill has been sent. Pt can hold off on appt for now but must be seen within the next 3 months for additional refills.   Copied from [place] [phone]. Topic: Appointment Scheduling - Scheduling Inquiry for Clinic >> Jul 05, 2018  3:52 PM Rutherford Nail, [place] wrote: Reason for CRM: Patient's wife calling and states the pharmacy advised patient that he would need an appointment to get more Flomax. Patient's wife would like to make an appointment for him. Please advise.  CB#: [phone] >> Jul 05, 2018  4:16 PM Para Skeans A wrote: Patient unable to do a virtual visit. Can we send in refill? I do not even see a request?!

## 2018-07-06 NOTE — Telephone Encounter (Signed)
Patient has been informed.

## 2018-08-01 ENCOUNTER — Other Ambulatory Visit: Payer: Self-pay

## 2018-08-01 MED ORDER — LOSARTAN POTASSIUM 100 MG PO TABS: 100.0000 mg | ORAL_TABLET | Freq: Every day | ORAL | 1 refills | Status: DC

## 2018-09-19 ENCOUNTER — Encounter: Payer: Self-pay | Admitting: Internal Medicine

## 2018-09-20 MED ORDER — LANSOPRAZOLE 30 MG PO CPDR: 30.0000 mg | DELAYED_RELEASE_CAPSULE | Freq: Every day | ORAL | 3 refills | Status: DC

## 2018-10-02 ENCOUNTER — Ambulatory Visit: Payer: PPO | Admitting: Internal Medicine

## 2018-11-02 ENCOUNTER — Other Ambulatory Visit: Payer: Self-pay

## 2018-12-24 ENCOUNTER — Other Ambulatory Visit: Payer: Self-pay | Admitting: Internal Medicine

## 2018-12-24 MED ORDER — TAMSULOSIN HCL 0.4 MG PO CAPS: 0.4000 mg | ORAL_CAPSULE | Freq: Every day | ORAL | 0 refills | Status: DC

## 2018-12-24 NOTE — Telephone Encounter (Signed)
Medication Refill - Medication: tamsulosin (FLOMAX) 0.4 MG CAPS capsule  Pt stated he contacted his pharmacy over a week ago for refill and they told him they have not heard from practice.  Has the patient contacted their pharmacy? Yes.   (Agent: If no, request that the patient contact the pharmacy for the refill.) (Agent: If yes, when and what did the pharmacy advise?)  Preferred Pharmacy (with phone number or street name):  [id number] [address], [place] [phone] (Phone) [phone] (Fax)     Agent: Please be advised that RX refills may take up to 3 business days. We ask that you follow-up with your pharmacy.

## 2018-12-24 NOTE — Telephone Encounter (Signed)
Routing to CMA 

## 2018-12-24 NOTE — Telephone Encounter (Signed)
Requested medication (s) are due for refill today: yes  Requested medication (s) are on the active medication list: yes  Last refill:  07/2018  Future visit scheduled: no  Notes to clinic:  Review for refill Patient states that pharmacy requested over a week ago   Requested Prescriptions  Pending Prescriptions Disp Refills   tamsulosin (FLOMAX) 0.4 MG CAPS capsule 90 capsule 0    Sig: Take 1 capsule (0.4 mg total) by mouth at bedtime.     Urology: Alpha-Adrenergic Blocker Failed - 12/24/2018 10:52 AM      Failed - Valid encounter within last 12 months    Recent Outpatient Visits          8 months ago Moderate persistent asthma with acute exacerbation   [place] [facility], James W, MD   11 months ago [place], [place] Rep[place], [place]   1 year ago Preventative health care   [facility] -Georges Mouse, MD   1 year ago Allergic rhinitis, unspecified seasonality, unspecified trigger   [facility] -Georges Mouse, MD   1 year ago Moderate persistent asthma with acute exacerbation   [place], MD             Passed - Last BP in normal range    BP Readings from Last 1 Encounters:  04/09/18 122/72

## 2018-12-27 ENCOUNTER — Ambulatory Visit (INDEPENDENT_AMBULATORY_CARE_PROVIDER_SITE_OTHER): Payer: PPO | Admitting: Internal Medicine

## 2018-12-27 ENCOUNTER — Inpatient Hospital Stay (HOSPITAL_BASED_OUTPATIENT_CLINIC_OR_DEPARTMENT_OTHER)
Admission: EM | Admit: 2018-12-27 | Discharge: 2019-01-01 | DRG: 445 | Disposition: A | Discharge status: Home / Self Care | Payer: PPO | Attending: Internal Medicine | Admitting: Internal Medicine

## 2018-12-27 ENCOUNTER — Emergency Department (HOSPITAL_BASED_OUTPATIENT_CLINIC_OR_DEPARTMENT_OTHER): Payer: PPO

## 2018-12-27 ENCOUNTER — Other Ambulatory Visit: Payer: Self-pay

## 2018-12-27 ENCOUNTER — Encounter (HOSPITAL_BASED_OUTPATIENT_CLINIC_OR_DEPARTMENT_OTHER): Payer: Self-pay | Admitting: *Deleted

## 2018-12-27 ENCOUNTER — Encounter: Payer: Self-pay | Admitting: Internal Medicine

## 2018-12-27 DIAGNOSIS — F05 Delirium due to known physiological condition: Secondary | ICD-10-CM | POA: Diagnosis not present

## 2018-12-27 DIAGNOSIS — Z79899 Other long term (current) drug therapy: Secondary | ICD-10-CM

## 2018-12-27 DIAGNOSIS — J9601 Acute respiratory failure with hypoxia: Secondary | ICD-10-CM

## 2018-12-27 DIAGNOSIS — I129 Hypertensive chronic kidney disease with stage 1 through stage 4 chronic kidney disease, or unspecified chronic kidney disease: Secondary | ICD-10-CM | POA: Diagnosis not present

## 2018-12-27 DIAGNOSIS — Z888 Allergy status to other drugs, medicaments and biological substances status: Secondary | ICD-10-CM

## 2018-12-27 DIAGNOSIS — R509 Fever, unspecified: Secondary | ICD-10-CM

## 2018-12-27 DIAGNOSIS — Z66 Do not resuscitate: Secondary | ICD-10-CM | POA: Diagnosis present

## 2018-12-27 DIAGNOSIS — R739 Hyperglycemia, unspecified: Secondary | ICD-10-CM | POA: Diagnosis not present

## 2018-12-27 DIAGNOSIS — K8050 Calculus of bile duct without cholangitis or cholecystitis without obstruction: Secondary | ICD-10-CM | POA: Diagnosis not present

## 2018-12-27 DIAGNOSIS — Z801 Family history of malignant neoplasm of trachea, bronchus and lung: Secondary | ICD-10-CM | POA: Diagnosis not present

## 2018-12-27 DIAGNOSIS — Z885 Allergy status to narcotic agent status: Secondary | ICD-10-CM | POA: Diagnosis not present

## 2018-12-27 DIAGNOSIS — N183 Chronic kidney disease, stage 3 (moderate): Secondary | ICD-10-CM | POA: Diagnosis not present

## 2018-12-27 DIAGNOSIS — Z87442 Personal history of urinary calculi: Secondary | ICD-10-CM

## 2018-12-27 DIAGNOSIS — F0390 Unspecified dementia without behavioral disturbance: Secondary | ICD-10-CM | POA: Diagnosis present

## 2018-12-27 DIAGNOSIS — Z79890 Hormone replacement therapy (postmenopausal): Secondary | ICD-10-CM | POA: Diagnosis not present

## 2018-12-27 DIAGNOSIS — K8309 Other cholangitis: Secondary | ICD-10-CM | POA: Diagnosis not present

## 2018-12-27 DIAGNOSIS — F411 Generalized anxiety disorder: Secondary | ICD-10-CM | POA: Diagnosis not present

## 2018-12-27 DIAGNOSIS — Z7982 Long term (current) use of aspirin: Secondary | ICD-10-CM

## 2018-12-27 DIAGNOSIS — Z825 Family history of asthma and other chronic lower respiratory diseases: Secondary | ICD-10-CM | POA: Diagnosis not present

## 2018-12-27 DIAGNOSIS — J449 Chronic obstructive pulmonary disease, unspecified: Secondary | ICD-10-CM | POA: Diagnosis not present

## 2018-12-27 DIAGNOSIS — Z8249 Family history of ischemic heart disease and other diseases of the circulatory system: Secondary | ICD-10-CM | POA: Diagnosis not present

## 2018-12-27 DIAGNOSIS — K831 Obstruction of bile duct: Secondary | ICD-10-CM | POA: Diagnosis not present

## 2018-12-27 DIAGNOSIS — K8030 Calculus of bile duct with cholangitis, unspecified, without obstruction: Secondary | ICD-10-CM | POA: Diagnosis not present

## 2018-12-27 DIAGNOSIS — K219 Gastro-esophageal reflux disease without esophagitis: Secondary | ICD-10-CM | POA: Diagnosis not present

## 2018-12-27 DIAGNOSIS — N179 Acute kidney failure, unspecified: Secondary | ICD-10-CM | POA: Diagnosis not present

## 2018-12-27 DIAGNOSIS — E039 Hypothyroidism, unspecified: Secondary | ICD-10-CM | POA: Diagnosis present

## 2018-12-27 DIAGNOSIS — D72829 Elevated white blood cell count, unspecified: Secondary | ICD-10-CM | POA: Diagnosis not present

## 2018-12-27 DIAGNOSIS — R945 Abnormal results of liver function studies: Secondary | ICD-10-CM | POA: Diagnosis not present

## 2018-12-27 DIAGNOSIS — Z20828 Contact with and (suspected) exposure to other viral communicable diseases: Secondary | ICD-10-CM | POA: Diagnosis not present

## 2018-12-27 DIAGNOSIS — I10 Essential (primary) hypertension: Secondary | ICD-10-CM | POA: Diagnosis present

## 2018-12-27 DIAGNOSIS — N400 Enlarged prostate without lower urinary tract symptoms: Secondary | ICD-10-CM | POA: Diagnosis not present

## 2018-12-27 DIAGNOSIS — N189 Chronic kidney disease, unspecified: Secondary | ICD-10-CM | POA: Diagnosis not present

## 2018-12-27 DIAGNOSIS — Z23 Encounter for immunization: Secondary | ICD-10-CM

## 2018-12-27 DIAGNOSIS — R0602 Shortness of breath: Secondary | ICD-10-CM | POA: Diagnosis not present

## 2018-12-27 DIAGNOSIS — K8031 Calculus of bile duct with cholangitis, unspecified, with obstruction: Secondary | ICD-10-CM | POA: Diagnosis not present

## 2018-12-27 DIAGNOSIS — E785 Hyperlipidemia, unspecified: Secondary | ICD-10-CM | POA: Diagnosis present

## 2018-12-27 DIAGNOSIS — R1111 Vomiting without nausea: Secondary | ICD-10-CM | POA: Diagnosis not present

## 2018-12-27 DIAGNOSIS — K8051 Calculus of bile duct without cholangitis or cholecystitis with obstruction: Secondary | ICD-10-CM | POA: Diagnosis not present

## 2018-12-27 DIAGNOSIS — J411 Mucopurulent chronic bronchitis: Secondary | ICD-10-CM | POA: Diagnosis not present

## 2018-12-27 LAB — COMPREHENSIVE METABOLIC PANEL
ALT: 17 U/L (ref 0–44)
AST: 25 U/L (ref 15–41)
Albumin: 4.2 g/dL (ref 3.5–5.0)
Alkaline Phosphatase: 76 U/L (ref 38–126)
Anion gap: 12 (ref 5–15)
BUN: 36 mg/dL — ABNORMAL HIGH (ref 8–23)
CO2: 24 mmol/L (ref 22–32)
Calcium: 9.6 mg/dL (ref 8.9–10.3)
Chloride: 99 mmol/L (ref 98–111)
Creatinine, Ser: 1.97 mg/dL — ABNORMAL HIGH (ref 0.61–1.24)
GFR calc Af Amer: 34 mL/min — ABNORMAL LOW (ref >60)
GFR calc non Af Amer: 29 mL/min — ABNORMAL LOW (ref >60)
Glucose, Bld: 155 mg/dL — ABNORMAL HIGH (ref 70–99)
Potassium: 4.6 mmol/L (ref 3.5–5.1)
Sodium: 135 mmol/L (ref 135–145)
Total Bilirubin: 1.6 mg/dL — ABNORMAL HIGH (ref 0.3–1.2)
Total Protein: 7.7 g/dL (ref 6.5–8.1)

## 2018-12-27 LAB — CBC WITH DIFFERENTIAL/PLATELET
Abs Immature Granulocytes: 0.04 10*3/uL (ref 0.00–0.07)
Basophils Absolute: 0 10*3/uL (ref 0.0–0.1)
Basophils Relative: 0 %
Eosinophils Absolute: 0.1 10*3/uL (ref 0.0–0.5)
Eosinophils Relative: 0 %
HCT: 40.6 % (ref 39.0–52.0)
Hemoglobin: 13.2 g/dL (ref 13.0–17.0)
Immature Granulocytes: 0 %
Lymphocytes Relative: 9 %
Lymphs Abs: 1.3 10*3/uL (ref 0.7–4.0)
MCH: 30.9 pg (ref 26.0–34.0)
MCHC: 32.5 g/dL (ref 30.0–36.0)
MCV: 95.1 fL (ref 80.0–100.0)
Monocytes Absolute: 1.2 10*3/uL — ABNORMAL HIGH (ref 0.1–1.0)
Monocytes Relative: 8 %
Neutro Abs: 12.8 10*3/uL — ABNORMAL HIGH (ref 1.7–7.7)
Neutrophils Relative %: 83 %
Platelets: 263 10*3/uL (ref 150–400)
RBC: 4.27 MIL/uL (ref 4.22–5.81)
RDW: 12 % (ref 11.5–15.5)
WBC: 15.4 10*3/uL — ABNORMAL HIGH (ref 4.0–10.5)
nRBC: 0 % (ref 0.0–0.2)

## 2018-12-27 LAB — URINALYSIS, ROUTINE W REFLEX MICROSCOPIC
Bilirubin Urine: NEGATIVE
Glucose, UA: NEGATIVE mg/dL
Ketones, ur: NEGATIVE mg/dL
Leukocytes,Ua: NEGATIVE
Nitrite: NEGATIVE
Protein, ur: 100 mg/dL — AB
Specific Gravity, Urine: 1.025 (ref 1.005–1.030)
pH: 6 (ref 5.0–8.0)

## 2018-12-27 LAB — URINALYSIS, MICROSCOPIC (REFLEX): WBC, UA: NONE SEEN WBC/hpf (ref 0–5)

## 2018-12-27 LAB — LIPASE, BLOOD: Lipase: 19 U/L (ref 11–51)

## 2018-12-27 MED ORDER — PIPERACILLIN-TAZOBACTAM 3.375 G IVPB 30 MIN
3.3750 g | Freq: Once | INTRAVENOUS | Status: AC
  Administered 2018-12-27: 3.375 g via INTRAVENOUS
  Filled 2018-12-27 (×2): qty 50

## 2018-12-27 MED ORDER — SODIUM CHLORIDE 0.9 % IV BOLUS
1000.0000 mL | Freq: Once | INTRAVENOUS | Status: AC
  Administered 2018-12-27: 1000 mL via INTRAVENOUS

## 2018-12-27 MED ORDER — ONDANSETRON HCL 4 MG/2ML IJ SOLN
4.0000 mg | Freq: Once | INTRAMUSCULAR | Status: AC
  Administered 2018-12-27: 4 mg via INTRAVENOUS
  Filled 2018-12-27: qty 2

## 2018-12-27 NOTE — Assessment & Plan Note (Signed)
stable overall by history and exam, recent data reviewed with pt, and pt to continue medical treatment as before,  to f/u any worsening symptoms or concerns  

## 2018-12-27 NOTE — Progress Notes (Signed)
Patient ID: Stephen Wilkins, male   DOB: 1928/02/25, 85 y.o.   MRN: [id number]  Virtual Visit via Video Note  I connected with Stephen Wilkins on 12/27/18 at  3:20 PM EDT by a video enabled telemedicine application and verified that I am speaking with the correct person using two identifiers.  Location: Patient: at home Provider: at office   I discussed the limitations of evaluation and management by telemedicine and the availability of in person appointments. The patient expressed understanding and agreed to proceed.  History of Present Illness: Here with c/o 3 daysonset recurring vertigo symptoms , dizziness, eye jumping, hard to stand, had to crawl out of bed yesterday, dramamine helped, assoc with head congestiong, low grade temp 99.4, mid abd soreness but Denies worsening reflux, dysphagia,  bowel change or blood but has several episodes vomiting, maybe some abd distension as well.  Small diarrhea yesterday only, no blood.  BP 154/95 today as could not keep oral meds down. Pt denies chest pain, increased sob or doe, wheezing, orthopnea, PND, increased LE swelling, palpitations, dizziness or syncope.   Pt denies polydipsia, polyuria Past Medical History:  Diagnosis Date  . ABNORMAL ELECTROCARDIOGRAM 06/21/2007  . ALLERGIC RHINITIS 03/23/2007  . Anxiety state, unspecified 09/06/2013  . ASTHMATIC BRONCHITIS, ACUTE 04/27/2007  . BENIGN PROSTATIC HYPERTROPHY 03/23/2007  . BRONCHITIS NOT SPECIFIED AS ACUTE OR CHRONIC 04/21/2007  . BURSITIS, RIGHT HIP 07/31/2008  . COPD (chronic obstructive pulmonary disease) ([place]) 04/19/2016  . Cough 01/29/2009  . Dementia ([place]) 09/01/2010  . ERECTILE DYSFUNCTION 03/23/2007  . ERECTILE DYSFUNCTION, ORGANIC 04/17/2009  . Esophageal stricture   . FREQUENCY, URINARY 04/26/2010  . GERD 03/23/2007  . HIATAL HERNIA   . HYPERLIPIDEMIA 03/23/2007  . HYPERTENSION 03/23/2007  . MILD COGNITIVE IMPAIRMENT SO STATED 05/19/2010  . NEPHROLITHIASIS, HX OF 03/23/2007  . PSA,  INCREASED 06/27/2008  . RASH-NONVESICULAR 03/23/2007  . REACTIVE AIRWAY DISEASE 01/14/2010  . SCHATZKI'S RING   . SCIATICA, RIGHT 04/28/2008  . Unspecified hypothyroidism 09/06/2013  . Wheezing 04/17/2009   Past Surgical History:  Procedure Laterality Date  . TONSILLECTOMY      reports that he has never smoked. He has never used smokeless tobacco. He reports that he does not drink alcohol or use drugs. family history includes Asthma in his sister; Cancer in an other family member; Emphysema in his brother; Hypertension in an other family member; Lung cancer in his brother. Allergies  Allergen Reactions  . Atorvastatin     REACTION: myalgia  . Doxazosin Mesylate     REACTION: dizziness  . Hydrocodone-Homatropine Other (See Comments)    anxiety   Current Outpatient Medications on File Prior to Visit  Medication Sig Dispense Refill  . albuterol (PROVENTIL HFA;VENTOLIN HFA) 108 (90 Base) MCG/ACT inhaler Inhale 2 puffs into the lungs every 6 (six) hours as needed for wheezing. 1 Inhaler 11  . ALPRAZolam (XANAX) 0.5 MG tablet Take 1 tablet (0.5 mg total) by mouth daily as needed for anxiety. 30 tablet 2  . aspirin EC 81 MG tablet Take 1 tablet (81 mg total) by mouth daily. 90 tablet 11  . Azelastine HCl 0.15 % SOLN Place 2 sprays into both nostrils 2 (two) times daily. 30 mL 0  . budesonide-formoterol (SYMBICORT) 80-4.5 MCG/ACT inhaler Take 2 puffs first thing in am and then another 2 puffs about 12 hours later. (Patient taking differently: Take 2 puffs at bedtime) 1 Inhaler 11  . cetirizine (ZYRTEC) 10 MG tablet Take 1 tablet (10 mg  total) by mouth daily. 30 tablet 11  . Cholecalciferol (VITAMIN D) 1000 UNITS capsule Take 1,000 Units by mouth daily.      . cyanocobalamin 1000 MCG tablet Take 100 mcg by mouth daily.    Marland Kitchen desoximetasone (TOPICORT) 0.25 % cream APPLY SPARINGLY TO THE AFFECTED AREA TWICE DAILY AS NEEDED AS DIRECTED 30 g 1  . lansoprazole (PREVACID) 30 MG capsule Take 1 capsule  (30 mg total) by mouth daily at 12 noon. 90 capsule 3  . levothyroxine (SYNTHROID, LEVOTHROID) 112 MCG tablet Take 1 tablet (112 mcg total) by mouth daily. 90 tablet 0  . losartan (COZAAR) 100 MG tablet Take 1 tablet (100 mg total) by mouth daily. 90 tablet 1  . meclizine (ANTIVERT) 12.5 MG tablet Take 1 tablet (12.5 mg total) by mouth 3 (three) times daily as needed for dizziness. 30 tablet 0  . montelukast (SINGULAIR) 10 MG tablet Take 1 tablet (10 mg total) by mouth at bedtime. 30 tablet 11  . Multiple Vitamins-Minerals (CENTRUM SILVER PO) Take by mouth daily.      . polyethylene glycol powder (MIRALAX) powder Take 1/2 capful by mouth once daily 850 g 5  . predniSONE (DELTASONE) 10 MG tablet 3 tabs by mouth per day for 3 days,2tabs per day for 3 days,1tab per day for 3 days 18 tablet 0  . rosuvastatin (CRESTOR) 10 MG tablet TAKE 1 TABLET BY MOUTH EVERY DAY 90 tablet 0  . tamsulosin (FLOMAX) 0.4 MG CAPS capsule Take 1 capsule (0.4 mg total) by mouth at bedtime. 90 capsule 0  . tiZANidine (ZANAFLEX) 2 MG tablet Take 1 tablet (2 mg total) by mouth every 6 (six) hours as needed for muscle spasms. 40 tablet 1  . traMADol (ULTRAM) 50 MG tablet Take 1 tablet (50 mg total) by mouth every 6 (six) hours as needed. 28 tablet 0  . vitamin C (ASCORBIC ACID) 500 MG tablet Take 500 mg by mouth daily.     No current facility-administered medications on file prior to visit.     Observations/Objective: Alert, NAD, appropriate mood and affect, resps normal, cn 2-12 intact, moves all 4s, no visible rash or swelling Lab Results  Component Value Date   WBC 15.4 (H) 12/27/2018   HGB 13.2 12/27/2018   HCT 40.6 12/27/2018   PLT 263 12/27/2018   GLUCOSE 155 (H) 12/27/2018   CHOL 138 08/29/2017   TRIG 201.0 (H) 08/29/2017   HDL 47.80 08/29/2017   LDLDIRECT 68.0 08/29/2017   LDLCALC 119 (H) 07/21/2016   ALT 17 12/27/2018   AST 25 12/27/2018   NA 135 12/27/2018   K 4.6 12/27/2018   CL 99 12/27/2018    CREATININE 1.97 (H) 12/27/2018   BUN 36 (H) 12/27/2018   CO2 24 12/27/2018   TSH 0.10 (L) 01/19/2018   PSA 3.94 03/12/2014   HGBA1C 6.4 08/29/2017   Assessment and Plan: See notes  Follow Up Instructions: See notes   I discussed the assessment and treatment plan with the patient. The patient was provided an opportunity to ask questions and all were answered. The patient agreed with the plan and demonstrated an understanding of the instructions.   The patient was advised to call back or seek an in-person evaluation if the symptoms worsen or if the condition fails to improve as anticipated.   Cathlean Cower, MD

## 2018-12-27 NOTE — ED Triage Notes (Signed)
Pt sent here by PMD for eval abd pain n/v/d x 1 week

## 2018-12-27 NOTE — Assessment & Plan Note (Signed)
Low grade temp assoc with vertigo and congestion as well as abd pain, ? Viral vs other, I suggeted pt [facility] or ED asap

## 2018-12-27 NOTE — Patient Instructions (Signed)
Please to got ED now  Please continue all other medications as before, and refills have been done if requested.  Please have the pharmacy call with any other refills you may need.  Please continue your efforts at being more active, low cholesterol diet, and weight control.  Please keep your appointments with your specialists as you may have planned

## 2018-12-27 NOTE — ED Provider Notes (Signed)
[facility] Provider Note   CSN: [id number] Arrival date & time: 12/27/18  1626     History   Chief Complaint Chief Complaint  Patient presents with   Abdominal Pain    HPI BURNETT SPRAY is a 85 y.o. male.     85 yo M with a chief complaints of nausea vomiting diarrhea.  Going on for 4 days.  No fevers or chills.  Patient called his family doctor today who suggested he come to the ED for evaluation.  Describes sharp pain that starts in the epigastrium that goes down the abdomen.  No blood or dark stool.  No prior history of abdominal surgery.  The history is provided by the patient and the spouse.  Abdominal Pain Pain location:  Generalized Pain quality: aching   Pain radiates to:  Does not radiate Pain severity:  Moderate Onset quality:  Gradual Duration:  2 days Timing:  Constant Progression:  Worsening Chronicity:  New Relieved by:  Nothing Worsened by:  Nothing Ineffective treatments:  None tried Associated symptoms: nausea and vomiting   Associated symptoms: no chest pain, no chills, no diarrhea, no fever and no shortness of breath     Past Medical History:  Diagnosis Date   ABNORMAL ELECTROCARDIOGRAM 06/21/2007   ALLERGIC RHINITIS 03/23/2007   Anxiety state, unspecified 09/06/2013   ASTHMATIC BRONCHITIS, ACUTE 04/27/2007   BENIGN PROSTATIC HYPERTROPHY 03/23/2007   BRONCHITIS NOT SPECIFIED AS ACUTE OR CHRONIC 04/21/2007   BURSITIS, RIGHT HIP 07/31/2008   COPD (chronic obstructive pulmonary disease) ([place]) 04/19/2016   Cough 01/29/2009   Dementia ([place]) 09/01/2010   ERECTILE DYSFUNCTION 03/23/2007   ERECTILE DYSFUNCTION, ORGANIC 04/17/2009   Esophageal stricture    FREQUENCY, URINARY 04/26/2010   GERD 03/23/2007   HIATAL HERNIA    HYPERLIPIDEMIA 03/23/2007   HYPERTENSION 03/23/2007   MILD COGNITIVE IMPAIRMENT SO STATED 05/19/2010   NEPHROLITHIASIS, HX OF 03/23/2007   PSA, INCREASED 06/27/2008   RASH-NONVESICULAR  03/23/2007   REACTIVE AIRWAY DISEASE 01/14/2010   SCHATZKI'S RING    SCIATICA, RIGHT 04/28/2008   Unspecified hypothyroidism 09/06/2013   Wheezing 04/17/2009    Patient Active Problem List   Diagnosis Date Noted   Febrile illness 12/27/2018   Low back pain 07/11/2017   Non compliance w medication regimen 08/23/2016   Peripheral neuropathy 08/23/2016   COPD (chronic obstructive pulmonary disease) ([place]) 04/19/2016   Hyperglycemia 02/10/2016   Preventative health care 07/29/2015   CAP (community acquired pneumonia) 05/30/2015   Abnormal CXR 05/30/2015   Cough 04/22/2015   Asthma with exacerbation 04/22/2015   Urine abnormality 04/22/2015   Abdominal pain 09/11/2014   Abnormal liver function test 09/11/2014   Dysphagia 07/29/2014   Anxiety state 09/06/2013   CKD (chronic kidney disease) 09/06/2013   Hypothyroidism 09/06/2013   Rash and nonspecific skin eruption 02/27/2013   Dizziness and giddiness 01/04/2013   Abnormal TSH 06/04/2012   Gross hematuria 01/09/2012   Bruising 06/05/2011   Dementia ([place]) 09/01/2010   Cough variant asthma vs UACS  01/14/2010   ERECTILE DYSFUNCTION, ORGANIC 04/17/2009   FATIGUE 06/27/2008   PSA, INCREASED 06/27/2008   SCIATICA, RIGHT 04/28/2008   SCHATZKI'S RING 07/18/2007   HIATAL HERNIA 07/18/2007   ABNORMAL ELECTROCARDIOGRAM 06/21/2007   Hyperlipidemia 03/23/2007   Essential hypertension 03/23/2007   Allergic rhinitis 03/23/2007   GERD 03/23/2007   BENIGN PROSTATIC HYPERTROPHY 03/23/2007   NEPHROLITHIASIS, HX OF 03/23/2007    Past Surgical History:  Procedure Laterality Date   TONSILLECTOMY  Home Medications    Prior to Admission medications   Medication Sig Start Date End Date Taking? Authorizing Provider  albuterol (PROVENTIL HFA;VENTOLIN HFA) 108 (90 Base) MCG/ACT inhaler Inhale 2 puffs into the lungs every 6 (six) hours as needed for wheezing. 07/21/16 10/26/17  Biagio Borg, MD   ALPRAZolam Duanne Moron) 0.5 MG tablet Take 1 tablet (0.5 mg total) by mouth daily as needed for anxiety. 07/29/14   Biagio Borg, MD  aspirin EC 81 MG tablet Take 1 tablet (81 mg total) by mouth daily. 02/10/16   Biagio Borg, MD  Azelastine HCl 0.15 % SOLN Place 2 sprays into both nostrils 2 (two) times daily. 04/01/16   Harrison Mons, PA  budesonide-formoterol (SYMBICORT) 80-4.5 MCG/ACT inhaler Take 2 puffs first thing in am and then another 2 puffs about 12 hours later. Patient taking differently: Take 2 puffs at bedtime 05/09/17   Tanda Rockers, MD  cetirizine (ZYRTEC) 10 MG tablet Take 1 tablet (10 mg total) by mouth daily. 08/10/17 08/10/18  Biagio Borg, MD  Cholecalciferol (VITAMIN D) 1000 UNITS capsule Take 1,000 Units by mouth daily.      [provider]  cyanocobalamin 1000 MCG tablet Take 100 mcg by mouth daily.    [provider]  desoximetasone (TOPICORT) 0.25 % cream APPLY SPARINGLY TO THE AFFECTED AREA TWICE DAILY AS NEEDED AS DIRECTED 04/30/18   Biagio Borg, MD  lansoprazole (PREVACID) 30 MG capsule Take 1 capsule (30 mg total) by mouth daily at 12 noon. 09/20/18   Biagio Borg, MD  levothyroxine (SYNTHROID, LEVOTHROID) 112 MCG tablet Take 1 tablet (112 mcg total) by mouth daily. 01/22/18   Marrian Salvage, FNP  losartan (COZAAR) 100 MG tablet Take 1 tablet (100 mg total) by mouth daily. 08/01/18   Biagio Borg, MD  meclizine (ANTIVERT) 12.5 MG tablet Take 1 tablet (12.5 mg total) by mouth 3 (three) times daily as needed for dizziness. 01/19/18   Marrian Salvage, FNP  montelukast (SINGULAIR) 10 MG tablet Take 1 tablet (10 mg total) by mouth at bedtime. 08/10/17   Biagio Borg, MD  Multiple Vitamins-Minerals (CENTRUM SILVER PO) Take by mouth daily.      [provider]  polyethylene glycol powder (MIRALAX) powder Take 1/2 capful by mouth once daily 01/28/15   Biagio Borg, MD  predniSONE (DELTASONE) 10 MG tablet 3 tabs by mouth per day for 3  days,2tabs per day for 3 days,1tab per day for 3 days 04/09/18   Biagio Borg, MD  rosuvastatin (CRESTOR) 10 MG tablet TAKE 1 TABLET BY MOUTH EVERY DAY 10/26/17   Biagio Borg, MD  tamsulosin (FLOMAX) 0.4 MG CAPS capsule Take 1 capsule (0.4 mg total) by mouth at bedtime. 12/24/18   Biagio Borg, MD  tiZANidine (ZANAFLEX) 2 MG tablet Take 1 tablet (2 mg total) by mouth every 6 (six) hours as needed for muscle spasms. 07/11/17   Biagio Borg, MD  traMADol (ULTRAM) 50 MG tablet Take 1 tablet (50 mg total) by mouth every 6 (six) hours as needed. 07/11/17   Biagio Borg, MD  vitamin C (ASCORBIC ACID) 500 MG tablet Take 500 mg by mouth daily.    [provider]    Family History Family History  Problem Relation Age of Onset   Lung cancer Brother        smoked   Cancer Other        lung cancer   Hypertension  Other    Emphysema Brother        smoked   Asthma Sister     Social History Social History   Tobacco Use   Smoking status: Never Smoker   Smokeless tobacco: Never Used   Tobacco comment: tried smoking in HS  Substance Use Topics   Alcohol use: No   Drug use: No     Allergies   Atorvastatin, Doxazosin mesylate, and Hydrocodone-homatropine   Review of Systems Review of Systems  Constitutional: Negative for chills and fever.  HENT: Negative for congestion and facial swelling.   Eyes: Negative for discharge and visual disturbance.  Respiratory: Negative for shortness of breath.   Cardiovascular: Negative for chest pain and palpitations.  Gastrointestinal: Positive for abdominal pain, nausea and vomiting. Negative for diarrhea.  Musculoskeletal: Negative for arthralgias and myalgias.  Skin: Negative for color change and rash.  Neurological: Negative for tremors, syncope and headaches.  Psychiatric/Behavioral: Negative for confusion and dysphoric mood.     Physical Exam Updated Vital Signs BP (!) 192/128 (BP Location: Right Arm)    Pulse 96    Temp 98.2  F (36.8 C)    Resp 18    Ht 6' (1.829 m)    Wt 88.5 kg    SpO2 98%    BMI 26.45 kg/m   Physical Exam Vitals signs and nursing note reviewed.  Constitutional:      Appearance: He is well-developed.  HENT:     Head: Normocephalic and atraumatic.  Eyes:     Pupils: Pupils are equal, round, and reactive to light.  Neck:     Musculoskeletal: Normal range of motion and neck supple.     Vascular: No JVD.  Cardiovascular:     Rate and Rhythm: Normal rate and regular rhythm.     Heart sounds: No murmur. No friction rub. No gallop.   Pulmonary:     Effort: No respiratory distress.     Breath sounds: No wheezing.  Abdominal:     General: There is no distension.     Tenderness: There is abdominal tenderness (mild periumbilical). There is no guarding or rebound.  Musculoskeletal: Normal range of motion.  Skin:    Coloration: Skin is not pale.     Findings: No rash.  Neurological:     Mental Status: He is alert and oriented to person, place, and time.  Psychiatric:        Behavior: Behavior normal.      ED Treatments / Results  Labs (all labs ordered are listed, but only abnormal results are displayed) Labs Reviewed  CBC WITH DIFFERENTIAL/PLATELET - Abnormal; Notable for the following components:      Result Value   WBC 15.4 (*)    Neutro Abs 12.8 (*)    Monocytes Absolute 1.2 (*)    All other components within normal limits  COMPREHENSIVE METABOLIC PANEL - Abnormal; Notable for the following components:   Glucose, Bld 155 (*)    BUN 36 (*)    Creatinine, Ser 1.97 (*)    Total Bilirubin 1.6 (*)    GFR calc non Af Amer 29 (*)    GFR calc Af Amer 34 (*)    All other components within normal limits  URINALYSIS, ROUTINE W REFLEX MICROSCOPIC - Abnormal; Notable for the following components:   Hgb urine dipstick SMALL (*)    Protein, ur 100 (*)    All other components within normal limits  URINALYSIS, MICROSCOPIC (REFLEX) - Abnormal; Notable for the  following components:    Bacteria, UA RARE (*)    All other components within normal limits  SARS CORONAVIRUS 2 (TAT 6-24 HRS)  LIPASE, BLOOD    EKG None  Radiology Ct Abdomen Pelvis Wo Contrast  Result Date: 12/27/2018 CLINICAL DATA:  85 year old male with abdominal pain, nausea and vomiting. EXAM: CT ABDOMEN AND PELVIS WITHOUT CONTRAST TECHNIQUE: Multidetector CT imaging of the abdomen and pelvis was performed following the standard protocol without IV contrast. COMPARISON:  CT of the abdomen pelvis dated 02/15/2012 FINDINGS: Evaluation of this exam is limited in the absence of intravenous contrast. Lower chest: The visualized lung bases are clear. There is multi vessel coronary vascular calcification. No intra-abdominal free air or free fluid. Hepatobiliary: The liver is grossly unremarkable. There is moderate intrahepatic biliary ductal dilatation, new since the prior CT. There is a 13 mm stone in the central CBD. An additional 10 mm stone is noted in the region of the ampulla. The common bile duct is dilated and measures up to 2 cm. There is layering sludge or stones within the gallbladder. No pericholecystic fluid. Further evaluation with MRI/MRCP is recommended. Pancreas: The pancreas is unremarkable. Spleen: Normal in size without focal abnormality. Adrenals/Urinary Tract: The adrenal glands are unremarkable. Moderate left and mild right renal parenchyma atrophy. Multiple small nonobstructing bilateral renal calculi measure up to 4 mm in the upper pole of the right kidney. There is no hydronephrosis on either side. Several right renal hypodense lesions with the largest measuring up to 5 cm from the inferior pole of the right kidney. These lesions are suboptimally characterized on this noncontrast CT however demonstrate fluid attenuation, likely cysts. The visualized ureters and urinary bladder appear unremarkable. Stomach/Bowel: There is sigmoid diverticulosis without active inflammatory changes. There is no bowel  obstruction or active inflammation. The appendix is normal. Vascular/Lymphatic: There is advanced aortoiliac atherosclerotic disease. Ectatic aorta measures up to 2.5 cm. Dilated common iliac arteries bilaterally measure up to 17 mm on the right. The IVC is unremarkable on this noncontrast CT. No portal venous gas. There is no adenopathy. Reproductive: Enlarged prostate gland measuring 5 cm in transverse axial diameter. The seminal vesicles are symmetric. Other: None Musculoskeletal: Degenerative changes of the spine. No acute osseous pathology. IMPRESSION: 1. Obstructing stones in the central CBD/ampulla region with moderate intrahepatic and extrahepatic biliary ductal dilatation, new since the prior CT. Further evaluation with MRI/MRCP is recommended. 2. Small nonobstructing bilateral renal calculi. No hydronephrosis. 3. Sigmoid diverticulosis. No bowel obstruction or active inflammation. Normal appendix. Aortic Atherosclerosis (ICD10-I70.0). Electronically Signed   By: Anner Crete M.D.   On: 12/27/2018 19:34    Procedures Procedures (including critical care time)  Medications Ordered in ED Medications  piperacillin-tazobactam (ZOSYN) IVPB 3.375 g (has no administration in time range)  sodium chloride 0.9 % bolus 1,000 mL ( Intravenous Stopped 12/27/18 2046)  ondansetron (ZOFRAN) injection 4 mg (4 mg Intravenous Given 12/27/18 1934)     Initial Impression / Assessment and Plan / ED Course  I have reviewed the triage vital signs and the nursing notes.  Pertinent labs & imaging results that were available during my care of the patient were reviewed by me and considered in my medical decision making (see chart for details).        85 yo M with a chief complaints of nausea vomiting and diarrhea.  Going on for about 4 days.  Patient has a leukocytosis of 15,000.  CT scan is concerning for multiple common bile duct stones  with dilatation.  His LFTs are unremarkable.  His bilirubin is mildly  elevated from his baseline.  I discussed this with Dr. Lyndel Safe, gastroenterology.  He recommended an MRCP.  Ice chips.  Recommend antibiotics with a significant leukocytosis.  We will see the patient likely tomorrow and evaluate for possible ERCP.  The patients results and plan were reviewed and discussed.   Any x-rays performed were independently reviewed by myself.   Differential diagnosis were considered with the presenting HPI.  Medications  piperacillin-tazobactam (ZOSYN) IVPB 3.375 g (has no administration in time range)  sodium chloride 0.9 % bolus 1,000 mL ( Intravenous Stopped 12/27/18 2046)  ondansetron (ZOFRAN) injection 4 mg (4 mg Intravenous Given 12/27/18 1934)    Vitals:   12/27/18 1636 12/27/18 1638 12/27/18 1956  BP:  (!) 157/93 (!) 192/128  Pulse:  86 96  Resp:  16 18  Temp:  98.2 F (36.8 C)   SpO2:  98% 98%  Weight: 88.5 kg    Height: 6' (1.829 m)      Final diagnoses:  Common bile duct stone    Admission/ observation were discussed with the admitting physician, patient and/or family and they are comfortable with the plan.    Final Clinical Impressions(s) / ED Diagnoses   Final diagnoses:  Common bile duct stone    ED Discharge Orders    None       Deno Etienne, DO 12/27/18 2115

## 2018-12-28 ENCOUNTER — Inpatient Hospital Stay (HOSPITAL_COMMUNITY): Payer: PPO | Admitting: Anesthesiology

## 2018-12-28 ENCOUNTER — Encounter (HOSPITAL_COMMUNITY)
Admission: EM | Disposition: A | Discharge status: Home / Self Care | Payer: Self-pay | Source: Home / Self Care | Attending: Internal Medicine

## 2018-12-28 ENCOUNTER — Inpatient Hospital Stay (HOSPITAL_COMMUNITY): Payer: PPO

## 2018-12-28 ENCOUNTER — Other Ambulatory Visit: Payer: Self-pay

## 2018-12-28 ENCOUNTER — Encounter (HOSPITAL_COMMUNITY): Payer: Self-pay | Admitting: Internal Medicine

## 2018-12-28 DIAGNOSIS — F0150 Vascular dementia without behavioral disturbance: Secondary | ICD-10-CM

## 2018-12-28 DIAGNOSIS — D72829 Elevated white blood cell count, unspecified: Secondary | ICD-10-CM

## 2018-12-28 DIAGNOSIS — I10 Essential (primary) hypertension: Secondary | ICD-10-CM

## 2018-12-28 DIAGNOSIS — K8051 Calculus of bile duct without cholangitis or cholecystitis with obstruction: Secondary | ICD-10-CM

## 2018-12-28 DIAGNOSIS — K831 Obstruction of bile duct: Secondary | ICD-10-CM

## 2018-12-28 DIAGNOSIS — K8050 Calculus of bile duct without cholangitis or cholecystitis without obstruction: Secondary | ICD-10-CM

## 2018-12-28 HISTORY — PX: SPHINCTEROTOMY: SHX5544

## 2018-12-28 HISTORY — PX: REMOVAL OF STONES: SHX5545

## 2018-12-28 HISTORY — PX: ERCP: SHX5425

## 2018-12-28 LAB — HEPATIC FUNCTION PANEL
ALT: 43 U/L (ref 0–44)
AST: 68 U/L — ABNORMAL HIGH (ref 15–41)
Albumin: 3.9 g/dL (ref 3.5–5.0)
Alkaline Phosphatase: 71 U/L (ref 38–126)
Bilirubin, Direct: 0.5 mg/dL — ABNORMAL HIGH (ref 0.0–0.2)
Indirect Bilirubin: 1.7 mg/dL — ABNORMAL HIGH (ref 0.3–0.9)
Total Bilirubin: 2.2 mg/dL — ABNORMAL HIGH (ref 0.3–1.2)
Total Protein: 7 g/dL (ref 6.5–8.1)

## 2018-12-28 LAB — BASIC METABOLIC PANEL
Anion gap: 9 (ref 5–15)
BUN: 35 mg/dL — ABNORMAL HIGH (ref 8–23)
CO2: 25 mmol/L (ref 22–32)
Calcium: 9.2 mg/dL (ref 8.9–10.3)
Chloride: 106 mmol/L (ref 98–111)
Creatinine, Ser: 1.97 mg/dL — ABNORMAL HIGH (ref 0.61–1.24)
GFR calc Af Amer: 34 mL/min — ABNORMAL LOW (ref >60)
GFR calc non Af Amer: 29 mL/min — ABNORMAL LOW (ref >60)
Glucose, Bld: 121 mg/dL — ABNORMAL HIGH (ref 70–99)
Potassium: 5.1 mmol/L (ref 3.5–5.1)
Sodium: 140 mmol/L (ref 135–145)

## 2018-12-28 LAB — CBC WITH DIFFERENTIAL/PLATELET
Abs Immature Granulocytes: 0.08 10*3/uL — ABNORMAL HIGH (ref 0.00–0.07)
Basophils Absolute: 0 10*3/uL (ref 0.0–0.1)
Basophils Relative: 0 %
Eosinophils Absolute: 0.1 10*3/uL (ref 0.0–0.5)
Eosinophils Relative: 0 %
HCT: 39.3 % (ref 39.0–52.0)
Hemoglobin: 12.4 g/dL — ABNORMAL LOW (ref 13.0–17.0)
Immature Granulocytes: 1 %
Lymphocytes Relative: 9 %
Lymphs Abs: 1.6 10*3/uL (ref 0.7–4.0)
MCH: 30.8 pg (ref 26.0–34.0)
MCHC: 31.6 g/dL (ref 30.0–36.0)
MCV: 97.8 fL (ref 80.0–100.0)
Monocytes Absolute: 1.4 10*3/uL — ABNORMAL HIGH (ref 0.1–1.0)
Monocytes Relative: 8 %
Neutro Abs: 13.5 10*3/uL — ABNORMAL HIGH (ref 1.7–7.7)
Neutrophils Relative %: 82 %
Platelets: 241 10*3/uL (ref 150–400)
RBC: 4.02 MIL/uL — ABNORMAL LOW (ref 4.22–5.81)
RDW: 12.1 % (ref 11.5–15.5)
WBC: 16.6 10*3/uL — ABNORMAL HIGH (ref 4.0–10.5)
nRBC: 0 % (ref 0.0–0.2)

## 2018-12-28 LAB — SARS CORONAVIRUS 2 BY RT PCR (HOSPITAL ORDER, PERFORMED IN ~~LOC~~ HOSPITAL LAB): SARS Coronavirus 2: NEGATIVE

## 2018-12-28 SURGERY — ERCP, WITH INTERVENTION IF INDICATED
Anesthesia: General

## 2018-12-28 MED ORDER — PHENYLEPHRINE 40 MCG/ML (10ML) SYRINGE FOR IV PUSH (FOR BLOOD PRESSURE SUPPORT)
PREFILLED_SYRINGE | INTRAVENOUS | Status: DC | PRN
  Administered 2018-12-28 (×7): 120 ug via INTRAVENOUS

## 2018-12-28 MED ORDER — ALBUTEROL SULFATE HFA 108 (90 BASE) MCG/ACT IN AERS
2.0000 | INHALATION_SPRAY | Freq: Four times a day (QID) | RESPIRATORY_TRACT | Status: DC | PRN
  Administered 2018-12-28: 5 via RESPIRATORY_TRACT
  Administered 2018-12-31: 2 via RESPIRATORY_TRACT
  Filled 2018-12-28: qty 6.7

## 2018-12-28 MED ORDER — PIPERACILLIN-TAZOBACTAM 3.375 G IVPB
3.3750 g | Freq: Three times a day (TID) | INTRAVENOUS | Status: DC
  Administered 2018-12-28 – 2019-01-01 (×14): 3.375 g via INTRAVENOUS
  Filled 2018-12-28 (×14): qty 50

## 2018-12-28 MED ORDER — DEXAMETHASONE SODIUM PHOSPHATE 4 MG/ML IJ SOLN
INTRAMUSCULAR | Status: DC | PRN
  Administered 2018-12-28: 4 mg via INTRAVENOUS

## 2018-12-28 MED ORDER — DEXTROSE-NACL 5-0.45 % IV SOLN
INTRAVENOUS | Status: DC
  Administered 2018-12-28 – 2018-12-29 (×2): via INTRAVENOUS

## 2018-12-28 MED ORDER — EPHEDRINE SULFATE-NACL 50-0.9 MG/10ML-% IV SOSY
PREFILLED_SYRINGE | INTRAVENOUS | Status: DC | PRN
  Administered 2018-12-28: 10 mg via INTRAVENOUS

## 2018-12-28 MED ORDER — SUGAMMADEX SODIUM 200 MG/2ML IV SOLN
INTRAVENOUS | Status: DC | PRN
  Administered 2018-12-28: 200 mg via INTRAVENOUS

## 2018-12-28 MED ORDER — ACETAMINOPHEN 650 MG RE SUPP: 650.0000 mg | Freq: Four times a day (QID) | RECTAL | Status: DC | PRN

## 2018-12-28 MED ORDER — GLUCAGON HCL RDNA (DIAGNOSTIC) 1 MG IJ SOLR
INTRAMUSCULAR | Status: AC
  Filled 2018-12-28: qty 1

## 2018-12-28 MED ORDER — ONDANSETRON HCL 4 MG PO TABS: 4.0000 mg | ORAL_TABLET | Freq: Four times a day (QID) | ORAL | Status: DC | PRN

## 2018-12-28 MED ORDER — INDOMETHACIN 50 MG RE SUPP
RECTAL | Status: DC | PRN
  Administered 2018-12-28: 100 mg via RECTAL

## 2018-12-28 MED ORDER — SODIUM CHLORIDE 0.9 % IV SOLN
INTRAVENOUS | Status: DC | PRN
  Administered 2018-12-28: 50 ug/min via INTRAVENOUS

## 2018-12-28 MED ORDER — ROCURONIUM BROMIDE 10 MG/ML (PF) SYRINGE
PREFILLED_SYRINGE | INTRAVENOUS | Status: DC | PRN
  Administered 2018-12-28: 50 mg via INTRAVENOUS

## 2018-12-28 MED ORDER — PROPOFOL 10 MG/ML IV BOLUS
INTRAVENOUS | Status: DC | PRN
  Administered 2018-12-28: 110 mg via INTRAVENOUS

## 2018-12-28 MED ORDER — HYDRALAZINE HCL 20 MG/ML IJ SOLN
10.0000 mg | INTRAMUSCULAR | Status: DC | PRN
  Administered 2019-01-01: 10 mg via INTRAVENOUS
  Filled 2018-12-28: qty 1

## 2018-12-28 MED ORDER — SODIUM CHLORIDE 0.9 % IV SOLN
INTRAVENOUS | Status: DC | PRN
  Administered 2018-12-28: 13:00:00 30 mL

## 2018-12-28 MED ORDER — ALBUMIN HUMAN 5 % IV SOLN
INTRAVENOUS | Status: AC
  Filled 2018-12-28: qty 250

## 2018-12-28 MED ORDER — LACTATED RINGERS IV SOLN
INTRAVENOUS | Status: DC | PRN
  Administered 2018-12-28: 12:00:00 via INTRAVENOUS

## 2018-12-28 MED ORDER — LEVOTHYROXINE SODIUM 100 MCG/5ML IV SOLN
56.0000 ug | Freq: Every day | INTRAVENOUS | Status: DC
  Administered 2018-12-29: 12:00:00 56 ug via INTRAVENOUS
  Filled 2018-12-28 (×2): qty 5

## 2018-12-28 MED ORDER — FENTANYL CITRATE (PF) 100 MCG/2ML IJ SOLN
INTRAMUSCULAR | Status: DC | PRN
  Administered 2018-12-28: 100 ug via INTRAVENOUS

## 2018-12-28 MED ORDER — LACTATED RINGERS IV SOLN
INTRAVENOUS | Status: DC
  Administered 2018-12-28: 12:00:00 via INTRAVENOUS

## 2018-12-28 MED ORDER — INFLUENZA VAC A&B SA ADJ QUAD 0.5 ML IM PRSY
0.5000 mL | PREFILLED_SYRINGE | INTRAMUSCULAR | Status: AC
  Administered 2018-12-29: 12:00:00 0.5 mL via INTRAMUSCULAR
  Filled 2018-12-28: qty 0.5

## 2018-12-28 MED ORDER — FENTANYL CITRATE (PF) 100 MCG/2ML IJ SOLN
INTRAMUSCULAR | Status: AC
  Filled 2018-12-28: qty 2

## 2018-12-28 MED ORDER — LIDOCAINE 2% (20 MG/ML) 5 ML SYRINGE
INTRAMUSCULAR | Status: DC | PRN
  Administered 2018-12-28: 60 mg via INTRAVENOUS

## 2018-12-28 MED ORDER — INDOMETHACIN 50 MG RE SUPP
RECTAL | Status: AC
  Filled 2018-12-28: qty 2

## 2018-12-28 MED ORDER — SODIUM CHLORIDE 0.9 % IV SOLN: INTRAVENOUS | Status: DC

## 2018-12-28 MED ORDER — ACETAMINOPHEN 325 MG PO TABS
650.0000 mg | ORAL_TABLET | Freq: Four times a day (QID) | ORAL | Status: DC | PRN
  Administered 2018-12-30: 650 mg via ORAL
  Filled 2018-12-28: qty 2

## 2018-12-28 MED ORDER — GLUCAGON HCL RDNA (DIAGNOSTIC) 1 MG IJ SOLR
INTRAMUSCULAR | Status: DC | PRN
  Administered 2018-12-28: 0.25 mg via INTRAVENOUS

## 2018-12-28 MED ORDER — ONDANSETRON HCL 4 MG/2ML IJ SOLN
4.0000 mg | Freq: Four times a day (QID) | INTRAMUSCULAR | Status: DC | PRN
  Administered 2018-12-28: 4 mg via INTRAVENOUS

## 2018-12-28 MED ORDER — ALBUMIN HUMAN 5 % IV SOLN
INTRAVENOUS | Status: DC | PRN
  Administered 2018-12-28 (×2): via INTRAVENOUS

## 2018-12-28 MED ORDER — TAMSULOSIN HCL 0.4 MG PO CAPS
0.4000 mg | ORAL_CAPSULE | Freq: Every day | ORAL | Status: DC
  Administered 2018-12-28 – 2018-12-31 (×4): 0.4 mg via ORAL
  Filled 2018-12-28 (×4): qty 1

## 2018-12-28 NOTE — Anesthesia Preprocedure Evaluation (Addendum)
Anesthesia Evaluation  Patient identified by MRN, date of birth, ID band Patient awake    Reviewed: Allergy & Precautions, NPO status , Patient's Chart, lab work & pertinent test results  Airway Mallampati: II  TM Distance: >3 FB Neck ROM: Full    Dental no notable dental hx.    Pulmonary asthma , COPD,    Pulmonary exam normal breath sounds clear to auscultation       Cardiovascular hypertension, Normal cardiovascular exam Rhythm:Regular Rate:Normal     Neuro/Psych PSYCHIATRIC DISORDERS Anxiety Dementia negative neurological ROS     GI/Hepatic Neg liver ROS, GERD  ,  Endo/Other  Hypothyroidism   Renal/GU Renal InsufficiencyRenal disease     Musculoskeletal negative musculoskeletal ROS (+)   Abdominal   Peds  Hematology negative hematology ROS (+)   Anesthesia Other Findings Day of surgery medications reviewed with the patient.  Reproductive/Obstetrics                            Anesthesia Physical Anesthesia Plan  ASA: III  Anesthesia Plan: General   Post-op Pain Management:    Induction: Intravenous  PONV Risk Score and Plan: 2 and Ondansetron and Dexamethasone  Airway Management Planned: Oral ETT  Additional Equipment:   Intra-op Plan:   Post-operative Plan: Extubation in OR  Informed Consent: I have reviewed the patients History and Physical, chart, labs and discussed the procedure including the risks, benefits and alternatives for the proposed anesthesia with the patient or authorized representative who has indicated his/her understanding and acceptance.       Plan Discussed with: CRNA  Anesthesia Plan Comments:        Anesthesia Quick Evaluation

## 2018-12-28 NOTE — H&P (Signed)
History and Physical    Stephen Wilkins NFA:[id number] DOB: [birth year] DOA: 12/27/2018  PCP: Biagio Borg, MD  Patient coming from: Home.  Chief Complaint: Abdominal pain.  History obtained from patient's wife ER physician.  HPI: Stephen Wilkins is a 85 y.o. male with history of hypertension hypothyroidism, asthma/COPD and early dementia has been experiencing abdominal pain off and on for last few days which acutely worsened over the last 2 days.  Patient also started having nausea vomiting.  Pain became more constant and at this time patient contacted her primary care physician who advised to come to the ER.  Denies any chest pain or shortness of breath.  ED Course: In the ER patient had CT abdomen and pelvis done which shows obstructing stones in the CBD with intrahepatic and extrahepatic biliary ductal dilatation.  LFTs were normal.  Patient was mildly febrile.  On-call gastroenterologist Dr. Lyndel Safe was consulted and patient admitted for further management.  At the time of my exam patient states his abdominal pain is improved.  Abdomen appears soft nontender.  Review of Systems: As per HPI, rest all negative.   Past Medical History:  Diagnosis Date   ABNORMAL ELECTROCARDIOGRAM 06/21/2007   ALLERGIC RHINITIS 03/23/2007   Anxiety state, unspecified 09/06/2013   ASTHMATIC BRONCHITIS, ACUTE 04/27/2007   BENIGN PROSTATIC HYPERTROPHY 03/23/2007   BRONCHITIS NOT SPECIFIED AS ACUTE OR CHRONIC 04/21/2007   BURSITIS, RIGHT HIP 07/31/2008   COPD (chronic obstructive pulmonary disease) ([place]) 04/19/2016   Cough 01/29/2009   Dementia ([place]) 09/01/2010   ERECTILE DYSFUNCTION 03/23/2007   ERECTILE DYSFUNCTION, ORGANIC 04/17/2009   Esophageal stricture    FREQUENCY, URINARY 04/26/2010   GERD 03/23/2007   HIATAL HERNIA    HYPERLIPIDEMIA 03/23/2007   HYPERTENSION 03/23/2007   MILD COGNITIVE IMPAIRMENT SO STATED 05/19/2010   NEPHROLITHIASIS, HX OF 03/23/2007   PSA, INCREASED 06/27/2008     RASH-NONVESICULAR 03/23/2007   REACTIVE AIRWAY DISEASE 01/14/2010   SCHATZKI'S RING    SCIATICA, RIGHT 04/28/2008   Unspecified hypothyroidism 09/06/2013   Wheezing 04/17/2009    Past Surgical History:  Procedure Laterality Date   TONSILLECTOMY       reports that he has never smoked. He has never used smokeless tobacco. He reports that he does not drink alcohol or use drugs.  Allergies  Allergen Reactions   Atorvastatin     REACTION: myalgia   Doxazosin Mesylate     REACTION: dizziness   Hydrocodone-Homatropine Other (See Comments)    anxiety    Family History  Problem Relation Age of Onset   Lung cancer Brother        smoked   Cancer Other        lung cancer   Hypertension Other    Emphysema Brother        smoked   Asthma Sister     Prior to Admission medications   Medication Sig Start Date End Date Taking? Authorizing Provider  aspirin EC 81 MG tablet Take 1 tablet (81 mg total) by mouth daily. Patient taking differently: Take 81 mg by mouth 2 (two) times a week.  02/10/16  Yes Biagio Borg, MD  Cholecalciferol (VITAMIN D) 1000 UNITS capsule Take 1,000 Units by mouth every other day.    Yes [provider]  desoximetasone (TOPICORT) 0.25 % cream APPLY SPARINGLY TO THE AFFECTED AREA TWICE DAILY AS NEEDED AS DIRECTED Patient taking differently: Apply 1 application topically 2 (two) times daily. Itching 04/30/18  Yes Biagio Borg, MD  lansoprazole (PREVACID) 30 MG capsule Take 1 capsule (30 mg total) by mouth daily at 12 noon. Patient taking differently: Take 30 mg by mouth daily as needed (heart burn).  09/20/18  Yes Biagio Borg, MD  levothyroxine (SYNTHROID, LEVOTHROID) 112 MCG tablet Take 1 tablet (112 mcg total) by mouth daily. 01/22/18  Yes Marrian Salvage, FNP  losartan (COZAAR) 100 MG tablet Take 1 tablet (100 mg total) by mouth daily. 08/01/18  Yes Biagio Borg, MD  Multiple Vitamins-Minerals (CENTRUM SILVER PO) Take 1 tablet by  mouth daily.    Yes [provider]  polyethylene glycol powder (MIRALAX) powder Take 1/2 capful by mouth once daily Patient taking differently: Take 17 g by mouth 2 (two) times a week.  01/28/15  Yes Biagio Borg, MD  tamsulosin (FLOMAX) 0.4 MG CAPS capsule Take 1 capsule (0.4 mg total) by mouth at bedtime. 12/24/18  Yes Biagio Borg, MD  vitamin B-12 (CYANOCOBALAMIN) 100 MCG tablet Take 100 mcg by mouth once a week.   Yes [provider]  vitamin C (ASCORBIC ACID) 500 MG tablet Take 1,000 mg by mouth daily.    Yes [provider]  albuterol (PROVENTIL HFA;VENTOLIN HFA) 108 (90 Base) MCG/ACT inhaler Inhale 2 puffs into the lungs every 6 (six) hours as needed for wheezing. 07/21/16 10/26/17  Biagio Borg, MD  ALPRAZolam Duanne Moron) 0.5 MG tablet Take 1 tablet (0.5 mg total) by mouth daily as needed for anxiety. Patient not taking: Reported on 12/28/2018 07/29/14   Biagio Borg, MD  Azelastine HCl 0.15 % SOLN Place 2 sprays into both nostrils 2 (two) times daily. Patient not taking: Reported on 12/28/2018 04/01/16   Harrison Mons, PA  budesonide-formoterol (SYMBICORT) 80-4.5 MCG/ACT inhaler Take 2 puffs first thing in am and then another 2 puffs about 12 hours later. Patient not taking: Reported on 12/28/2018 05/09/17   Tanda Rockers, MD  cetirizine (ZYRTEC) 10 MG tablet Take 1 tablet (10 mg total) by mouth daily. 08/10/17 08/10/18  Biagio Borg, MD  meclizine (ANTIVERT) 12.5 MG tablet Take 1 tablet (12.5 mg total) by mouth 3 (three) times daily as needed for dizziness. Patient not taking: Reported on 12/28/2018 01/19/18   Marrian Salvage, FNP  montelukast (SINGULAIR) 10 MG tablet Take 1 tablet (10 mg total) by mouth at bedtime. Patient not taking: Reported on 12/28/2018 08/10/17   Biagio Borg, MD  predniSONE (DELTASONE) 10 MG tablet 3 tabs by mouth per day for 3 days,2tabs per day for 3 days,1tab per day for 3 days Patient not taking: Reported on 12/28/2018 04/09/18   Biagio Borg, MD  rosuvastatin (CRESTOR) 10 MG tablet TAKE 1 TABLET BY MOUTH EVERY DAY Patient not taking: Reported on 12/28/2018 10/26/17   Biagio Borg, MD  tiZANidine (ZANAFLEX) 2 MG tablet Take 1 tablet (2 mg total) by mouth every 6 (six) hours as needed for muscle spasms. Patient not taking: Reported on 12/28/2018 07/11/17   Biagio Borg, MD  traMADol (ULTRAM) 50 MG tablet Take 1 tablet (50 mg total) by mouth every 6 (six) hours as needed. Patient not taking: Reported on 12/28/2018 07/11/17   Biagio Borg, MD    Physical Exam: Constitutional: Moderately built and nourished. Vitals:   12/27/18 1638 12/27/18 1956 12/27/18 2200 12/28/18 0018  BP: (!) 157/93 (!) 192/128 (!) 168/87 (!) 154/89  Pulse: 86 96 (!) 116 (!) 103  Resp: 16 18 18    Temp: 98.2 F (36.8 C)  98.9 F (37.2 C) 99.1 F (37.3 C)  TempSrc:   Oral Oral  SpO2: 98% 98% 93% 97%  Weight:      Height:       Eyes: Anicteric no pallor. ENMT: No discharge from the ears eyes nose or mouth. Neck: No mass felt.  No neck rigidity. Respiratory: No rhonchi or crepitations. Cardiovascular: S1-S2 heard. Abdomen: Soft nontender bowel sounds present. Musculoskeletal: No edema. Skin: No rash. Neurologic: Patient was sleeping but on arrival wakings patient is alert awake and follows commands.  Oriented to name and place. Psychiatric: Was sleeping but on awakening was alert and oriented to his name and place.   Labs on Admission: I have personally reviewed following labs and imaging studies  CBC: Recent Labs  Lab 12/27/18 1723  WBC 15.4*  NEUTROABS 12.8*  HGB 13.2  HCT 40.6  MCV 95.1  PLT 161   Basic Metabolic Panel: Recent Labs  Lab 12/27/18 1723  NA 135  K 4.6  CL 99  CO2 24  GLUCOSE 155*  BUN 36*  CREATININE 1.97*  CALCIUM 9.6   GFR: Estimated Creatinine Clearance: 28.4 mL/min (A) (by C-G formula based on SCr of 1.97 mg/dL (H)). Liver Function Tests: Recent Labs  Lab 12/27/18 1723  AST 25  ALT 17  ALKPHOS 76   BILITOT 1.6*  PROT 7.7  ALBUMIN 4.2   Recent Labs  Lab 12/27/18 1723  LIPASE 19   No results for input(s): AMMONIA in the last 168 hours. Coagulation Profile: No results for input(s): INR, PROTIME in the last 168 hours. Cardiac Enzymes: No results for input(s): CKTOTAL, CKMB, CKMBINDEX, TROPONINI in the last 168 hours. BNP (last 3 results) No results for input(s): PROBNP in the last 8760 hours. HbA1C: No results for input(s): HGBA1C in the last 72 hours. CBG: No results for input(s): GLUCAP in the last 168 hours. Lipid Profile: No results for input(s): CHOL, HDL, LDLCALC, TRIG, CHOLHDL, LDLDIRECT in the last 72 hours. Thyroid Function Tests: No results for input(s): TSH, T4TOTAL, FREET4, T3FREE, THYROIDAB in the last 72 hours. Anemia Panel: No results for input(s): VITAMINB12, FOLATE, FERRITIN, TIBC, IRON, RETICCTPCT in the last 72 hours. Urine analysis:    Component Value Date/Time   COLORURINE YELLOW 12/27/2018 1953   APPEARANCEUR CLEAR 12/27/2018 1953   LABSPEC 1.025 12/27/2018 1953   PHURINE 6.0 12/27/2018 1953   GLUCOSEU NEGATIVE 12/27/2018 1953   GLUCOSEU NEGATIVE 08/29/2017 1601   HGBUR SMALL (A) 12/27/2018 1953   BILIRUBINUR NEGATIVE 12/27/2018 1953   KETONESUR NEGATIVE 12/27/2018 1953   PROTEINUR 100 (A) 12/27/2018 1953   UROBILINOGEN 0.2 08/29/2017 1601   NITRITE NEGATIVE 12/27/2018 1953   LEUKOCYTESUR NEGATIVE 12/27/2018 1953   Sepsis Labs: @LABRCNTIP (procalcitonin:4,lacticidven:4) )No results found for this or any previous visit (from the past 240 hour(s)).   Radiological Exams on Admission: Ct Abdomen Pelvis Wo Contrast  Result Date: 12/27/2018 CLINICAL DATA:  85 year old male with abdominal pain, nausea and vomiting. EXAM: CT ABDOMEN AND PELVIS WITHOUT CONTRAST TECHNIQUE: Multidetector CT imaging of the abdomen and pelvis was performed following the standard protocol without IV contrast. COMPARISON:  CT of the abdomen pelvis dated 02/15/2012 FINDINGS:  Evaluation of this exam is limited in the absence of intravenous contrast. Lower chest: The visualized lung bases are clear. There is multi vessel coronary vascular calcification. No intra-abdominal free air or free fluid. Hepatobiliary: The liver is grossly unremarkable. There is moderate intrahepatic biliary ductal dilatation, new since the prior CT. There is a 13 mm stone in  the central CBD. An additional 10 mm stone is noted in the region of the ampulla. The common bile duct is dilated and measures up to 2 cm. There is layering sludge or stones within the gallbladder. No pericholecystic fluid. Further evaluation with MRI/MRCP is recommended. Pancreas: The pancreas is unremarkable. Spleen: Normal in size without focal abnormality. Adrenals/Urinary Tract: The adrenal glands are unremarkable. Moderate left and mild right renal parenchyma atrophy. Multiple small nonobstructing bilateral renal calculi measure up to 4 mm in the upper pole of the right kidney. There is no hydronephrosis on either side. Several right renal hypodense lesions with the largest measuring up to 5 cm from the inferior pole of the right kidney. These lesions are suboptimally characterized on this noncontrast CT however demonstrate fluid attenuation, likely cysts. The visualized ureters and urinary bladder appear unremarkable. Stomach/Bowel: There is sigmoid diverticulosis without active inflammatory changes. There is no bowel obstruction or active inflammation. The appendix is normal. Vascular/Lymphatic: There is advanced aortoiliac atherosclerotic disease. Ectatic aorta measures up to 2.5 cm. Dilated common iliac arteries bilaterally measure up to 17 mm on the right. The IVC is unremarkable on this noncontrast CT. No portal venous gas. There is no adenopathy. Reproductive: Enlarged prostate gland measuring 5 cm in transverse axial diameter. The seminal vesicles are symmetric. Other: None Musculoskeletal: Degenerative changes of the spine. No  acute osseous pathology. IMPRESSION: 1. Obstructing stones in the central CBD/ampulla region with moderate intrahepatic and extrahepatic biliary ductal dilatation, new since the prior CT. Further evaluation with MRI/MRCP is recommended. 2. Small nonobstructing bilateral renal calculi. No hydronephrosis. 3. Sigmoid diverticulosis. No bowel obstruction or active inflammation. Normal appendix. Aortic Atherosclerosis (ICD10-I70.0). Electronically Signed   By: Anner Crete M.D.   On: 12/27/2018 19:34     Assessment/Plan Principal Problem:   Choledocholithiasis Active Problems:   Essential hypertension   Dementia ([facility])   COPD (chronic obstructive pulmonary disease) ([place])    1. Abdominal pain and choledocholithiasis -gastroenterologist Dr. Lyndel Safe has been consulted.  MRCP has been ordered.  We will keep patient n.p.o.  Empiric antibiotics for now.  Follow LFTs.  Further recommendations per gastroenterologist. 2. History of hypertension we will keep patient on PRN IV hydralazine for now. 3. History of COPD/asthma presently not wheezing. 4. History of hypothyroidism we will keep patient on IV Synthroid until patient can swallow. 5. Chronic kidney disease stage III -creatinine appears to be at baseline.  Given that patient has CBD stones and may require procedure and will need more than 2 midnight stay patient will need inpatient status.   DVT prophylaxis: SCDs in anticipation of procedure. Code Status: DNR confirmed with patient's wife. Family Communication: Patient's wife. Disposition Plan: Home. Consults called: Copywriter, advertising. Admission status: Inpatient.   Rise Patience MD [facility] Pager [phone].  If 7PM-7AM, please contact night-coverage [email or web address] Password [email or web address]  12/28/2018, 1:26 AM

## 2018-12-28 NOTE — H&P (View-Only) (Signed)
Consultation  Referring Provider:  Dr. Tana Coast    Primary Care Physician:  Biagio Borg, MD Primary Gastroenterologist: Previously Dr. Olevia Perches        Reason for Consultation: Choledocholithiasis             HPI:   Stephen Wilkins is a 85 y.o. male with a past medical history of asthma/COPD and early dementia, as well as others listed below, who presented to the ER 12/27/18 with abdominal found. We are consulted in regards to a finding of choledocholithiasis.    Today, it should be noted the patient is doing well, but has a somewhat decreased mentation, though he is able to provide history, it was limited.  Explains that he was having pain "right at that valve that lets things in the stomach", for the past 2 to 3 days.  He believes this came on because "I was eating cold ice cream with something hot on it and it just did not work".  Explains that he had severe nausea and vomiting for a few days which led him to his hospitalization.  Did have one episode of diarrhea when all this started 3 days ago, since then has not had a bowel movement but "I am never regular".  Denies any previous abdominal pain, heartburn or reflux.    Denies fever, chills, weight loss or blood in his stool.     ER Course:  CT abdomen pelvis with obstructing stones in the CBD with intrahepatic and extrahepatic biliary ductal dilation, LFT's normal (though increased overnight)  Past Medical History:  Diagnosis Date   ABNORMAL ELECTROCARDIOGRAM 06/21/2007   ALLERGIC RHINITIS 03/23/2007   Anxiety state, unspecified 09/06/2013   ASTHMATIC BRONCHITIS, ACUTE 04/27/2007   BENIGN PROSTATIC HYPERTROPHY 03/23/2007   BRONCHITIS NOT SPECIFIED AS ACUTE OR CHRONIC 04/21/2007   BURSITIS, RIGHT HIP 07/31/2008   COPD (chronic obstructive pulmonary disease) ([place]) 04/19/2016   Cough 01/29/2009   Dementia ([place]) 09/01/2010   ERECTILE DYSFUNCTION 03/23/2007   ERECTILE DYSFUNCTION, ORGANIC 04/17/2009   Esophageal stricture    FREQUENCY,  URINARY 04/26/2010   GERD 03/23/2007   HIATAL HERNIA    HYPERLIPIDEMIA 03/23/2007   HYPERTENSION 03/23/2007   MILD COGNITIVE IMPAIRMENT SO STATED 05/19/2010   NEPHROLITHIASIS, HX OF 03/23/2007   PSA, INCREASED 06/27/2008   RASH-NONVESICULAR 03/23/2007   REACTIVE AIRWAY DISEASE 01/14/2010   SCHATZKI'S RING    SCIATICA, RIGHT 04/28/2008   Unspecified hypothyroidism 09/06/2013   Wheezing 04/17/2009    Past Surgical History:  Procedure Laterality Date   TONSILLECTOMY      Family History  Problem Relation Age of Onset   Lung cancer Brother        smoked   Cancer Other        lung cancer   Hypertension Other    Emphysema Brother        smoked   Asthma Sister    Social History   Tobacco Use   Smoking status: Never Smoker   Smokeless tobacco: Never Used   Tobacco comment: tried smoking in HS  Substance Use Topics   Alcohol use: No   Drug use: No    Prior to Admission medications   Medication Sig Start Date End Date Taking? Authorizing Provider  aspirin EC 81 MG tablet Take 1 tablet (81 mg total) by mouth daily. Patient taking differently: Take 81 mg by mouth 2 (two) times a week.  02/10/16  Yes Biagio Borg, MD  Cholecalciferol (VITAMIN D) 1000 UNITS  UNITS capsule Take 1,000 Units by mouth every other day.    Yes [provider]  desoximetasone (TOPICORT) 0.25 % cream APPLY SPARINGLY TO THE AFFECTED AREA TWICE DAILY AS NEEDED AS DIRECTED Patient taking differently: Apply 1 application topically 2 (two) times daily. Itching 04/30/18  Yes Biagio Borg, MD  lansoprazole (PREVACID) 30 MG capsule Take 1 capsule (30 mg total) by mouth daily at 12 noon. Patient taking differently: Take 30 mg by mouth daily as needed (heart burn).  09/20/18  Yes Biagio Borg, MD  levothyroxine (SYNTHROID, LEVOTHROID) 112 MCG tablet Take 1 tablet (112 mcg total) by mouth daily. 01/22/18  Yes Marrian Salvage, FNP  losartan (COZAAR) 100 MG tablet Take 1 tablet (100 mg  total) by mouth daily. 08/01/18  Yes Biagio Borg, MD  Multiple Vitamins-Minerals (CENTRUM SILVER PO) Take 1 tablet by mouth daily.    Yes [provider]  polyethylene glycol powder (MIRALAX) powder Take 1/2 capful by mouth once daily Patient taking differently: Take 17 g by mouth 2 (two) times a week.  01/28/15  Yes Biagio Borg, MD  tamsulosin (FLOMAX) 0.4 MG CAPS capsule Take 1 capsule (0.4 mg total) by mouth at bedtime. 12/24/18  Yes Biagio Borg, MD  vitamin B-12 (CYANOCOBALAMIN) 100 MCG tablet Take 100 mcg by mouth once a week.   Yes [provider]  vitamin C (ASCORBIC ACID) 500 MG tablet Take 1,000 mg by mouth daily.    Yes [provider]  albuterol (PROVENTIL HFA;VENTOLIN HFA) 108 (90 Base) MCG/ACT inhaler Inhale 2 puffs into the lungs every 6 (six) hours as needed for wheezing. 07/21/16 10/26/17  Biagio Borg, MD  ALPRAZolam Duanne Moron) 0.5 MG tablet Take 1 tablet (0.5 mg total) by mouth daily as needed for anxiety. Patient not taking: Reported on 12/28/2018 07/29/14   Biagio Borg, MD  Azelastine HCl 0.15 % SOLN Place 2 sprays into both nostrils 2 (two) times daily. Patient not taking: Reported on 12/28/2018 04/01/16   Harrison Mons, PA  budesonide-formoterol (SYMBICORT) 80-4.5 MCG/ACT inhaler Take 2 puffs first thing in am and then another 2 puffs about 12 hours later. Patient not taking: Reported on 12/28/2018 05/09/17   Tanda Rockers, MD  cetirizine (ZYRTEC) 10 MG tablet Take 1 tablet (10 mg total) by mouth daily. 08/10/17 08/10/18  Biagio Borg, MD  meclizine (ANTIVERT) 12.5 MG tablet Take 1 tablet (12.5 mg total) by mouth 3 (three) times daily as needed for dizziness. Patient not taking: Reported on 12/28/2018 01/19/18   Marrian Salvage, FNP  montelukast (SINGULAIR) 10 MG tablet Take 1 tablet (10 mg total) by mouth at bedtime. Patient not taking: Reported on 12/28/2018 08/10/17   Biagio Borg, MD  predniSONE (DELTASONE) 10 MG tablet 3 tabs by mouth per day  for 3 days,2tabs per day for 3 days,1tab per day for 3 days Patient not taking: Reported on 12/28/2018 04/09/18   Biagio Borg, MD  rosuvastatin (CRESTOR) 10 MG tablet TAKE 1 TABLET BY MOUTH EVERY DAY Patient not taking: Reported on 12/28/2018 10/26/17   Biagio Borg, MD  tiZANidine (ZANAFLEX) 2 MG tablet Take 1 tablet (2 mg total) by mouth every 6 (six) hours as needed for muscle spasms. Patient not taking: Reported on 12/28/2018 07/11/17   Biagio Borg, MD  traMADol (ULTRAM) 50 MG tablet Take 1 tablet (50 mg total) by mouth every 6 (six) hours as needed. Patient not taking: Reported on 12/28/2018 07/11/17  Hunt Oris, MD    Current Facility-Administered Medications  Medication Dose Route Frequency Provider Last Rate Last Dose   acetaminophen (TYLENOL) tablet 650 mg  650 mg Oral Q6H PRN Rise Patience, MD       Or   acetaminophen (TYLENOL) suppository 650 mg  650 mg Rectal Q6H PRN Rise Patience, MD       albuterol (VENTOLIN HFA) 108 (90 Base) MCG/ACT inhaler 2 puff  2 puff Inhalation Q6H PRN Rise Patience, MD       dextrose 5 %-0.45 % sodium chloride infusion   Intravenous Continuous Rai, Ripudeep K, MD       hydrALAZINE (APRESOLINE) injection 10 mg  10 mg Intravenous Q4H PRN Rise Patience, MD       influenza vaccine adjuvanted (FLUAD) injection 0.5 mL  0.5 mL Intramuscular Tomorrow-1000 Rise Patience, MD       levothyroxine (SYNTHROID, LEVOTHROID) injection 56 mcg  56 mcg Intravenous Daily Rai, Ripudeep K, MD       ondansetron (ZOFRAN) tablet 4 mg  4 mg Oral Q6H PRN Rise Patience, MD       Or   ondansetron [facility]) injection 4 mg  4 mg Intravenous Q6H PRN Rise Patience, MD       piperacillin-tazobactam (ZOSYN) IVPB 3.375 g  3.375 g Intravenous Q8H Rise Patience, MD 12.5 mL/hr at 12/28/18 0528 3.375 g at 12/28/18 0528   tamsulosin (FLOMAX) capsule 0.4 mg  0.4 mg Oral QHS Rai, Ripudeep K, MD        Allergies as of 12/27/2018 -  Review Complete 12/27/2018  Allergen Reaction Noted   Atorvastatin  03/16/2009   Doxazosin mesylate  03/23/2007   Hydrocodone-homatropine Other (See Comments) 04/19/2016     Review of Systems:    Constitutional: No weight loss, fever or chills Skin: No rash  Cardiovascular: No chest pain Respiratory: No SOB Gastrointestinal: See HPI and otherwise negative Genitourinary: No dysuria  Neurological: No headache, dizziness or syncope Musculoskeletal: No new muscle or joint pain Hematologic: No bleeding  Psychiatric: No history of depression or anxiety    Physical Exam:  Vital signs in last 24 hours: Temp:  [98.2 F (36.8 C)-99.6 F (37.6 C)] 99.6 F (37.6 C) (09/25 0550) Pulse Rate:  [86-116] 99 (09/25 0550) Resp:  [16-18] 16 (09/25 0550) BP: (154-192)/(87-128) 154/91 (09/25 0550) SpO2:  [93 %-98 %] 93 % (09/25 0550) Weight:  [88.5 kg] 88.5 kg (09/24 1636) Last BM Date: 12/25/18 General:   Pleasant Caucasian male appears to be in NAD, Well developed, Well nourished, alert and cooperative Head:  Normocephalic and atraumatic. Eyes:   PEERL, EOMI. No icterus. Conjunctiva pink. Ears:  Normal auditory acuity. Neck:  Supple Throat: Oral cavity and pharynx without inflammation, swelling or lesion. Teeth in good condition. Lungs: Respirations even and unlabored. Lungs clear to auscultation bilaterally.   No wheezes, crackles, or rhonchi.  Heart: Normal S1, S2. No MRG. Regular rate and rhythm. No peripheral edema, cyanosis or pallor.  Abdomen:  Soft, nondistended, nontender. No rebound or guarding. Normal bowel sounds. No appreciable masses or hepatomegaly. Rectal:  Not performed.  Msk:  Symmetrical without gross deformities. Peripheral pulses intact.  Extremities:  Without edema, no deformity or joint abnormality.  Neurologic:  Alert and  oriented x4;  grossly normal neurologically.  Skin:   Dry and intact without significant lesions or rashes.+ Jaundiced Psychiatric: Demonstrates  good judgement and reason without abnormal affect or behaviors.   LAB RESULTS: Recent  Labs    12/27/18 1723 12/28/18 0205  WBC 15.4* 16.6*  HGB 13.2 12.4*  HCT 40.6 39.3  PLT 263 241   BMET Recent Labs    12/27/18 1723 12/28/18 0205  NA 135 140  K 4.6 5.1  CL 99 106  CO2 24 25  GLUCOSE 155* 121*  BUN 36* 35*  CREATININE 1.97* 1.97*  CALCIUM 9.6 9.2   LFT Recent Labs    12/28/18 0205  PROT 7.0  ALBUMIN 3.9  AST 68*  ALT 43  ALKPHOS 71  BILITOT 2.2*  BILIDIR 0.5*  IBILI 1.7*   STUDIES: Ct Abdomen Pelvis Wo Contrast  Result Date: 12/27/2018 CLINICAL DATA:  85 year old male with abdominal pain, nausea and vomiting. EXAM: CT ABDOMEN AND PELVIS WITHOUT CONTRAST TECHNIQUE: Multidetector CT imaging of the abdomen and pelvis was performed following the standard protocol without IV contrast. COMPARISON:  CT of the abdomen pelvis dated 02/15/2012 FINDINGS: Evaluation of this exam is limited in the absence of intravenous contrast. Lower chest: The visualized lung bases are clear. There is multi vessel coronary vascular calcification. No intra-abdominal free air or free fluid. Hepatobiliary: The liver is grossly unremarkable. There is moderate intrahepatic biliary ductal dilatation, new since the prior CT. There is a 13 mm stone in the central CBD. An additional 10 mm stone is noted in the region of the ampulla. The common bile duct is dilated and measures up to 2 cm. There is layering sludge or stones within the gallbladder. No pericholecystic fluid. Further evaluation with MRI/MRCP is recommended. Pancreas: The pancreas is unremarkable. Spleen: Normal in size without focal abnormality. Adrenals/Urinary Tract: The adrenal glands are unremarkable. Moderate left and mild right renal parenchyma atrophy. Multiple small nonobstructing bilateral renal calculi measure up to 4 mm in the upper pole of the right kidney. There is no hydronephrosis on either side. Several right renal hypodense  lesions with the largest measuring up to 5 cm from the inferior pole of the right kidney. These lesions are suboptimally characterized on this noncontrast CT however demonstrate fluid attenuation, likely cysts. The visualized ureters and urinary bladder appear unremarkable. Stomach/Bowel: There is sigmoid diverticulosis without active inflammatory changes. There is no bowel obstruction or active inflammation. The appendix is normal. Vascular/Lymphatic: There is advanced aortoiliac atherosclerotic disease. Ectatic aorta measures up to 2.5 cm. Dilated common iliac arteries bilaterally measure up to 17 mm on the right. The IVC is unremarkable on this noncontrast CT. No portal venous gas. There is no adenopathy. Reproductive: Enlarged prostate gland measuring 5 cm in transverse axial diameter. The seminal vesicles are symmetric. Other: None Musculoskeletal: Degenerative changes of the spine. No acute osseous pathology. IMPRESSION: 1. Obstructing stones in the central CBD/ampulla region with moderate intrahepatic and extrahepatic biliary ductal dilatation, new since the prior CT. Further evaluation with MRI/MRCP is recommended. 2. Small nonobstructing bilateral renal calculi. No hydronephrosis. 3. Sigmoid diverticulosis. No bowel obstruction or active inflammation. Normal appendix. Aortic Atherosclerosis (ICD10-I70.0). Electronically Signed   By: Anner Crete M.D.   On: 12/27/2018 19:34    Impression / Plan:   Impression: 1.  Abdominal pain with choledocholithiasis: CT showing choledocholithiasis, patient is not currently in any pain though previously was having epigastric discomfort, LFTs increasing overnight 2.  Increased LFTs: AST 25--> 68, ALT 17--> 43, alk phos 76--> 71, total bilirubin 1.6--> 2.2, likely from above 3.  COPD 4.  Chronic kidney disease stage III 5.  Leukocytosis:15.4--> 16.6, on Zosyn  Plan: 1.  Discussing plans for ERCP with Dr.  Danis. 2.  Patient to remain n.p.o. 3.  Agree with  antibiotics 4. Reordered rapis COVID test to be done within the hour for patient 5. Spoke with wife on the phone who consents to procedure for patient,would like to be called when it is done 6.  Please await any final recommendations from Dr. Loletha Carrow later today  Thank you for your kind consultation, we will continue to follow.  Lavone Nian [facility]  12/28/2018, 8:42 AM   I have reviewed the entire case in detail with the above APP and discussed the plan in detail.  Therefore, I agree with the diagnoses recorded above. In addition,  I have personally interviewed and examined the patient and have personally reviewed any abdominal/pelvic CT scan images.  My additional thoughts are as follows:  Choledocholithiasis, possible low-grade cholangitis with leukocytosis although he certainly does not appear septic.  ERCP is planned for today, exact timing depending upon COVID testing. It would be done by my partner Dr. Lucio Edward, with whom I have discussed the case as well.  I did explain this to Mr. Gilman, but due to his dementia we also discussed everything with his wife Stephen Wilkins.  The benefits and risks of the planned procedure were described in detail with the patient or (when appropriate) their health care proxy.  Risks were outlined as including, but not limited to, bleeding, infection, perforation, adverse medication reaction leading to cardiac or pulmonary decompensation.  Pancreatitis can occur from ERCP. The limitation of incomplete mucosal visualization was also discussed.  No guarantees or warranties were given. Patient at increased risk for cardiopulmonary complications of procedure due to medical comorbidities.   Continue antibiotics in the meantime.  Nelida Meuse III Office:[phone]

## 2018-12-28 NOTE — Progress Notes (Signed)
Pharmacy Antibiotic Note  NYSHAWN GOWDY is a 85 y.o. male admitted on 12/27/2018 with Intra-abdominal infection.  Pharmacy has been consulted for zosyn dosing.  Plan: Zosyn 3.375g IV q8h (4 hour infusion).  Height: 6' (182.9 cm) Weight: 195 lb (88.5 kg) IBW/kg (Calculated) : 77.6  Temp (24hrs), Avg:98.7 F (37.1 C), Min:98.2 F (36.8 C), Max:99.1 F (37.3 C)  Recent Labs  Lab 12/27/18 1723  WBC 15.4*  CREATININE 1.97*    Estimated Creatinine Clearance: 28.4 mL/min (A) (by C-G formula based on SCr of 1.97 mg/dL (H)).    Allergies  Allergen Reactions  . Atorvastatin     REACTION: myalgia  . Doxazosin Mesylate     REACTION: dizziness  . Hydrocodone-Homatropine Other (See Comments)    anxiety    Antimicrobials this admission: Zosyn 12/27/2018 >>   Dose adjustments this admission: -  Microbiology results: -  Thank you for allowing pharmacy to be a part of this patient's care.  Nani Skillern Crowford 12/28/2018 1:33 AM

## 2018-12-28 NOTE — Anesthesia Postprocedure Evaluation (Signed)
Anesthesia Post Note  Patient: Stephen Wilkins  Procedure(s) Performed: ENDOSCOPIC RETROGRADE CHOLANGIOPANCREATOGRAPHY (ERCP) (N/A ) SPHINCTEROTOMY REMOVAL OF STONES     Patient location during evaluation: PACU Anesthesia Type: General Level of consciousness: awake and alert Pain management: pain level controlled Vital Signs Assessment: post-procedure vital signs reviewed and stable Respiratory status: spontaneous breathing, nonlabored ventilation and respiratory function stable Cardiovascular status: blood pressure returned to baseline and stable Postop Assessment: no apparent nausea or vomiting Anesthetic complications: no    Last Vitals:  Vitals:   12/28/18 1320 12/28/18 1325  BP: 112/77   Pulse: 84 86  Resp: 18 13  Temp:    SpO2: 100% 92%    Last Pain:  Vitals:   12/28/18 1307  TempSrc: Oral  PainSc:                  Thecla Forgione A.

## 2018-12-28 NOTE — Progress Notes (Signed)
[facility]                                                                              Patient Demographics  Stephen Wilkins, is a 85 y.o. male, DOB - [birth year], MVE:[id number]  Admit date - 12/27/2018   Admitting Physician Rise Patience, MD  Outpatient Primary MD for the patient is Biagio Borg, MD  Outpatient specialists:   LOS - 1  days   Medical records reviewed and are as summarized below:    Chief Complaint  Patient presents with   Abdominal Pain       Brief summary   Patient is a 85 year old male with history of hypertension, hypothyroidism, asthma/COPD, early dementia presented with abdominal pain off and on for the last few days, acutely worsened over the last 2 days.  Patient also reported nausea and vomiting.  CT abdomen and pelvis showed obstructing stones in the CBD with intrahepatic and extrahepatic biliary duct dilatation.  GI consulted, patient admitted for further work-up.   Assessment & Plan    Principal Problem: Acute abdominal pain secondary to choledocholithiasis -CT abdomen showed obstructing stones in CBD, intrahepatic and extrahepatic biliary duct dilatation -Continue n.p.o. status, placed on IV fluid hydration, IV Zosyn -Gastroenterology consulted, follow MRCP, will likely need ERCP  Active Problems:   Essential hypertension -Currently n.p.o., placed on IV hydralazine as needed with parameters  Early dementia (HCC) -Currently stable, no acute issues    COPD (chronic obstructive pulmonary disease) (HCC) -No wheezing, currently stable, placed on albuterol nebs as needed  CKD stage III -Creatinine appears to be at baseline, 1.9   Hypothyroidism -Follow TSH, for now placed on IV Synthroid until patient able to tolerate p.o.  BPH Continue tamsulosin  Code Status: DNR DVT Prophylaxis:  SCDs Family Communication: Discussed all imaging results, lab results, explained to the patient    Disposition Plan: Plan for  MRCP, ERCP today, remains inpatient until cleared by GI and tolerating diet  Time Spent in minutes 35 minutes  Procedures:  CT abdomen  Consultants:   Gastroenterology  Antimicrobials:   Anti-infectives (From admission, onward)   Start     Dose/Rate Route Frequency Ordered Stop   12/28/18 0600  piperacillin-tazobactam (ZOSYN) IVPB 3.375 g     3.375 g 12.5 mL/hr over 240 Minutes Intravenous Every 8 hours 12/28/18 0132     12/27/18 2100  piperacillin-tazobactam (ZOSYN) IVPB 3.375 g     3.375 g 100 mL/hr over 30 Minutes Intravenous  Once 12/27/18 2056 12/27/18 2149          Medications  Scheduled Meds:  influenza vaccine adjuvanted  0.5 mL Intramuscular Tomorrow-1000   levothyroxine  56 mcg Intravenous Daily   tamsulosin  0.4 mg Oral QHS   Continuous Infusions:  dextrose 5 % and 0.45% NaCl 75 mL/hr at 12/28/18 0936   piperacillin-tazobactam (ZOSYN)  IV 3.375 g (12/28/18 0528)   PRN Meds:.acetaminophen **OR** acetaminophen, albuterol, hydrALAZINE, ondansetron **OR** ondansetron (ZOFRAN) IV      Subjective:   [name] was seen and examined today.  No nausea or vomiting, low-grade temp 99.6 F.  At the time of my  examination, minimal abdominal pain.  Patient denies dizziness, chest pain, shortness of breath, new weakness, numbess, tingling. No acute events overnight.    Objective:   Vitals:   12/27/18 1956 12/27/18 2200 12/28/18 0018 12/28/18 0550  BP: (!) 192/128 (!) 168/87 (!) 154/89 (!) 154/91  Pulse: 96 (!) 116 (!) 103 99  Resp: 18 18  16   Temp:  98.9 F (37.2 C) 99.1 F (37.3 C) 99.6 F (37.6 C)  TempSrc:  Oral Oral Oral  SpO2: 98% 93% 97% 93%  Weight:      Height:        Intake/Output Summary (Last 24 hours) at 12/28/2018 1114 Last data filed at 12/27/2018 2149 Gross per 24 hour  Intake 1052.21 ml  Output --  Net 1052.21 ml     Wt Readings from Last 3 Encounters:  12/27/18 88.5 kg  04/09/18 93 kg  01/19/18 91.6 kg      Exam  General: Alert and oriented x 3, NAD  Eyes:   HEENT:  Atraumatic, normocephalic, normal oropharynx  Cardiovascular: S1 S2 auscultated, no murmurs, RRR  Respiratory: Clear to auscultation bilaterally, no wheezing, rales or rhonchi  Gastrointestinal: Soft, nontender, nondistended, + bowel sounds  Ext: no pedal edema bilaterally  Neuro: No new FND  Musculoskeletal: No digital cyanosis, clubbing  Skin: No rashes  Psych: Normal affect and demeanor, alert and oriented x3    Data Reviewed:  I have personally reviewed following labs and imaging studies  Micro Results No results found for this or any previous visit (from the past 240 hour(s)).  Radiology Reports Ct Abdomen Pelvis Wo Contrast  Result Date: 12/27/2018 CLINICAL DATA:  85 year old male with abdominal pain, nausea and vomiting. EXAM: CT ABDOMEN AND PELVIS WITHOUT CONTRAST TECHNIQUE: Multidetector CT imaging of the abdomen and pelvis was performed following the standard protocol without IV contrast. COMPARISON:  CT of the abdomen pelvis dated 02/15/2012 FINDINGS: Evaluation of this exam is limited in the absence of intravenous contrast. Lower chest: The visualized lung bases are clear. There is multi vessel coronary vascular calcification. No intra-abdominal free air or free fluid. Hepatobiliary: The liver is grossly unremarkable. There is moderate intrahepatic biliary ductal dilatation, new since the prior CT. There is a 13 mm stone in the central CBD. An additional 10 mm stone is noted in the region of the ampulla. The common bile duct is dilated and measures up to 2 cm. There is layering sludge or stones within the gallbladder. No pericholecystic fluid. Further evaluation with MRI/MRCP is recommended. Pancreas: The pancreas is unremarkable. Spleen: Normal in size without focal abnormality. Adrenals/Urinary Tract: The adrenal glands are unremarkable. Moderate left and mild right renal parenchyma atrophy. Multiple  small nonobstructing bilateral renal calculi measure up to 4 mm in the upper pole of the right kidney. There is no hydronephrosis on either side. Several right renal hypodense lesions with the largest measuring up to 5 cm from the inferior pole of the right kidney. These lesions are suboptimally characterized on this noncontrast CT however demonstrate fluid attenuation, likely cysts. The visualized ureters and urinary bladder appear unremarkable. Stomach/Bowel: There is sigmoid diverticulosis without active inflammatory changes. There is no bowel obstruction or active inflammation. The appendix is normal. Vascular/Lymphatic: There is advanced aortoiliac atherosclerotic disease. Ectatic aorta measures up to 2.5 cm. Dilated common iliac arteries bilaterally measure up to 17 mm on the right. The IVC is unremarkable on this noncontrast CT. No portal venous gas. There is no adenopathy. Reproductive: Enlarged prostate gland measuring 5  cm in transverse axial diameter. The seminal vesicles are symmetric. Other: None Musculoskeletal: Degenerative changes of the spine. No acute osseous pathology. IMPRESSION: 1. Obstructing stones in the central CBD/ampulla region with moderate intrahepatic and extrahepatic biliary ductal dilatation, new since the prior CT. Further evaluation with MRI/MRCP is recommended. 2. Small nonobstructing bilateral renal calculi. No hydronephrosis. 3. Sigmoid diverticulosis. No bowel obstruction or active inflammation. Normal appendix. Aortic Atherosclerosis (ICD10-I70.0). Electronically Signed   By: Anner Crete M.D.   On: 12/27/2018 19:34    Lab Data:  CBC: Recent Labs  Lab 12/27/18 1723 12/28/18 0205  WBC 15.4* 16.6*  NEUTROABS 12.8* 13.5*  HGB 13.2 12.4*  HCT 40.6 39.3  MCV 95.1 97.8  PLT 263 536   Basic Metabolic Panel: Recent Labs  Lab 12/27/18 1723 12/28/18 0205  NA 135 140  K 4.6 5.1  CL 99 106  CO2 24 25  GLUCOSE 155* 121*  BUN 36* 35*  CREATININE 1.97* 1.97*   CALCIUM 9.6 9.2   GFR: Estimated Creatinine Clearance: 28.4 mL/min (A) (by C-G formula based on SCr of 1.97 mg/dL (H)). Liver Function Tests: Recent Labs  Lab 12/27/18 1723 12/28/18 0205  AST 25 68*  ALT 17 43  ALKPHOS 76 71  BILITOT 1.6* 2.2*  PROT 7.7 7.0  ALBUMIN 4.2 3.9   Recent Labs  Lab 12/27/18 1723  LIPASE 19   No results for input(s): AMMONIA in the last 168 hours. Coagulation Profile: No results for input(s): INR, PROTIME in the last 168 hours. Cardiac Enzymes: No results for input(s): CKTOTAL, CKMB, CKMBINDEX, TROPONINI in the last 168 hours. BNP (last 3 results) No results for input(s): PROBNP in the last 8760 hours. HbA1C: No results for input(s): HGBA1C in the last 72 hours. CBG: No results for input(s): GLUCAP in the last 168 hours. Lipid Profile: No results for input(s): CHOL, HDL, LDLCALC, TRIG, CHOLHDL, LDLDIRECT in the last 72 hours. Thyroid Function Tests: No results for input(s): TSH, T4TOTAL, FREET4, T3FREE, THYROIDAB in the last 72 hours. Anemia Panel: No results for input(s): VITAMINB12, FOLATE, FERRITIN, TIBC, IRON, RETICCTPCT in the last 72 hours. Urine analysis:    Component Value Date/Time   COLORURINE YELLOW 12/27/2018 1953   APPEARANCEUR CLEAR 12/27/2018 1953   LABSPEC 1.025 12/27/2018 1953   PHURINE 6.0 12/27/2018 1953   GLUCOSEU NEGATIVE 12/27/2018 1953   GLUCOSEU NEGATIVE 08/29/2017 1601   HGBUR SMALL (A) 12/27/2018 1953   BILIRUBINUR NEGATIVE 12/27/2018 1953   KETONESUR NEGATIVE 12/27/2018 1953   PROTEINUR 100 (A) 12/27/2018 1953   UROBILINOGEN 0.2 08/29/2017 1601   NITRITE NEGATIVE 12/27/2018 1953   LEUKOCYTESUR NEGATIVE 12/27/2018 1953     Estelle Skibicki M.D. [facility] 12/28/2018, 11:14 AM  Pager: [phone] Between 7am to 7pm - call Pager - [phone]  After 7pm go to [email or web address] - password [email or web address]  Call night coverage person covering after 7pm

## 2018-12-28 NOTE — Interval H&P Note (Signed)
History and Physical Interval Note:  12/28/2018 11:57 AM  Stephen Wilkins  has presented today for surgery, with the diagnosis of Choledocholithiasis.  The various methods of treatment have been discussed with the patient and family. After consideration of risks, benefits and other options for treatment, the patient has consented to  Procedure(s): ENDOSCOPIC RETROGRADE CHOLANGIOPANCREATOGRAPHY (ERCP) (N/A) as a surgical intervention.  The patient's history has been reviewed, patient examined, no change in status, stable for surgery.  I have reviewed the patient's chart and labs.  Questions were answered to the patient's satisfaction.     Pricilla Riffle. Fuller Plan

## 2018-12-28 NOTE — Op Note (Addendum)
[facility] Patient Name: Stephen Wilkins Procedure Date: 12/28/2018 MRN: [id number] Attending MD: Ladene Artist , MD Date of Birth: 27-Jun-1927 CSN: [id number] Age: 85 Admit Type: Inpatient Procedure:                ERCP Indications:              Bile duct stone(s), Abdominal pain of suspected                            biliary origin Providers:                Pricilla Riffle. Fuller Plan, MD, Cleda Daub, RN, Lina Sar, Technician, Cherylynn Ridges, Technician,                            Caryl Pina CRNA Referring MD:             [facility] Medicines:                General Anesthesia Complications:            No immediate complications. Estimated Blood Loss:     Estimated blood loss: none. Procedure:                Pre-Anesthesia Assessment:                           - Prior to the procedure, a History and Physical                            was performed, and patient medications and                            allergies were reviewed. The patient's tolerance of                            previous anesthesia was also reviewed. The risks                            and benefits of the procedure and the sedation                            options and risks were discussed with the patient.                            All questions were answered, and informed consent                            was obtained. Prior Anticoagulants: The patient has                            taken no previous anticoagulant or antiplatelet                            agents. ASA Grade Assessment:  III - A patient with                            severe systemic disease. After reviewing the risks                            and benefits, the patient was deemed in                            satisfactory condition to undergo the procedure.                           After obtaining informed consent, the scope was                            passed under direct vision.  Throughout the                            procedure, the patient's blood pressure, pulse, and                            oxygen saturations were monitored continuously. The                            Duodenoscope was introduced through the mouth, and                            used to inject contrast into and used to inject                            contrast into the bile duct. The ERCP was                            accomplished with minor difficulty due to a large                            periampullary diverticulum. The patient tolerated                            the procedure well. Scope In: Scope Out: Findings:      The scout film was normal. The esophagus was successfully intubated       under direct vision. The scope was advanced to a normal major papilla in       the descending duodenum without detailed examination of the pharynx,       larynx and associated structures, and upper GI tract. There was a large       periampullary diverticulum and the ampulla was located mostly within the       diverticulum with the orifice facing down and away from the duodenal       lumen and into the diverticulum. The upper GI tract was otherwise       grossly normal. After adjusting the position of ampulla with the       sphincterotome the orifice was located. A straight Roadrunner wire was  passed into the biliary tree. The short-nosed traction sphincterotome       was passed over the guidewire and the bile duct was then deeply       cannulated. Contrast was injected. I personally interpreted the bile       duct images. There was appropriate flow of contrast through the ducts.       The common bile duct was moderately dilated, with stones causing an       obstruction. The largest diameter was 13 mm. The left and right hepatic       ducts and all intrahepatic branches were mildly dilated, with stones       causing an obstruction. The common bile duct contained several filling        defects thought to be stones and sludge. Small amounts of pus noted       draining in the bile. A 9 mm biliary sphincterotomy was made with a       traction (standard) sphincterotome using ERBE electrocautery. There was       no post-sphincterotomy bleeding. The biliary tree was swept multiple       times with a 12 mm balloon starting at the bifurcation. Four stones were       removed measuring 8-57mm. Sludge was removed. No stones or sludge       remained. The PD was not cannulated or injected by intention. Impression:               - Multiple stones and sludge noted on the                            cholangiogram.                           - The common bile duct was moderately dilated, with                            stones causing an obstruction.                           - Choledocholithiasis was found. Complete removal                            was accomplished by biliary sphincterotomy and                            balloon extraction.                           - Large periampullary diverticulum.                           - Cholangitis.                           - A biliary sphincterotomy was performed.                           - The biliary tree was swept and cleared. Moderate Sedation:      Not Applicable - Patient had care per Anesthesia. Recommendation:           -  Avoid aspirin and nonsteroidal anti-inflammatory                            medicines for 1 week post Indocin.                           - Observe patient's clinical course following                            today's ERCP with therapeutic intervention.                           - IV antibiotics in hospital and PO antibiotics                            upon discharge. Procedure Code(s):        --- Professional ---                           [phone], Endoscopic retrograde                            cholangiopancreatography (ERCP); with removal of                            calculi/debris from biliary/pancreatic  duct(s)                           43262, Endoscopic retrograde                            cholangiopancreatography (ERCP); with                            sphincterotomy/papillotomy Diagnosis Code(s):        --- Professional ---                           K80.51, Calculus of bile duct without cholangitis                            or cholecystitis with obstruction                           R10.9, Unspecified abdominal pain                           R93.2, Abnormal findings on diagnostic imaging of                            liver and biliary tract CPT copyright 2019 [employer]. All rights reserved. The codes documented in this report are preliminary and upon coder review may  be revised to meet current compliance requirements. Ladene Artist, MD 12/28/2018 1:02:21 PM This report has been signed electronically. Number of Addenda: 0

## 2018-12-28 NOTE — Progress Notes (Signed)
Responded to spiritual care consult. Pt was alert and wife was at bedside. Stephen Wilkins stated he was thankful that they did not remove his gall bladder. He was praising the staff for the their professionalism and care. Stephen Wilkins stated he was looking forward to going home tomorrow. I offered spiritual care with compassionate listening, ministry of presence, words of encouragement, and prayer.   Chaplain resident  Fidel Levy [phone]

## 2018-12-28 NOTE — Transfer of Care (Signed)
Immediate Anesthesia Transfer of Care Note  Patient: Stephen Wilkins  Procedure(s) Performed: ENDOSCOPIC RETROGRADE CHOLANGIOPANCREATOGRAPHY (ERCP) (N/A ) SPHINCTEROTOMY REMOVAL OF STONES  Patient Location: Endoscopy Unit  Anesthesia Type:General  Level of Consciousness: awake and patient cooperative  Airway & Oxygen Therapy: Patient Spontanous Breathing and Patient connected to face mask  Post-op Assessment: Report given to RN and Post -op Vital signs reviewed and stable  Post vital signs: Reviewed and stable  Last Vitals:  Vitals Value Taken Time  BP    Temp    Pulse    Resp    SpO2      Last Pain:  Vitals:   12/28/18 1147  TempSrc: Oral  PainSc: 0-No pain         Complications: No apparent anesthesia complications

## 2018-12-28 NOTE — Consult Note (Addendum)
Consultation  Referring Provider:  Dr. Tana Coast    Primary Care Physician:  Biagio Borg, MD Primary Gastroenterologist: Previously Dr. Olevia Perches        Reason for Consultation: Choledocholithiasis             HPI:   Stephen Wilkins is a 85 y.o. male with a past medical history of asthma/COPD and early dementia, as well as others listed below, who presented to the ER 12/27/18 with abdominal found. We are consulted in regards to a finding of choledocholithiasis.    Today, it should be noted the patient is doing well, but has a somewhat decreased mentation, though he is able to provide history, it was limited.  Explains that he was having pain "right at that valve that lets things in the stomach", for the past 2 to 3 days.  He believes this came on because "I was eating cold ice cream with something hot on it and it just did not work".  Explains that he had severe nausea and vomiting for a few days which led him to his hospitalization.  Did have one episode of diarrhea when all this started 3 days ago, since then has not had a bowel movement but "I am never regular".  Denies any previous abdominal pain, heartburn or reflux.    Denies fever, chills, weight loss or blood in his stool.     ER Course:  CT abdomen pelvis with obstructing stones in the CBD with intrahepatic and extrahepatic biliary ductal dilation, LFT's normal (though increased overnight)  Past Medical History:  Diagnosis Date   ABNORMAL ELECTROCARDIOGRAM 06/21/2007   ALLERGIC RHINITIS 03/23/2007   Anxiety state, unspecified 09/06/2013   ASTHMATIC BRONCHITIS, ACUTE 04/27/2007   BENIGN PROSTATIC HYPERTROPHY 03/23/2007   BRONCHITIS NOT SPECIFIED AS ACUTE OR CHRONIC 04/21/2007   BURSITIS, RIGHT HIP 07/31/2008   COPD (chronic obstructive pulmonary disease) ([place]) 04/19/2016   Cough 01/29/2009   Dementia ([place]) 09/01/2010   ERECTILE DYSFUNCTION 03/23/2007   ERECTILE DYSFUNCTION, ORGANIC 04/17/2009   Esophageal stricture    FREQUENCY,  URINARY 04/26/2010   GERD 03/23/2007   HIATAL HERNIA    HYPERLIPIDEMIA 03/23/2007   HYPERTENSION 03/23/2007   MILD COGNITIVE IMPAIRMENT SO STATED 05/19/2010   NEPHROLITHIASIS, HX OF 03/23/2007   PSA, INCREASED 06/27/2008   RASH-NONVESICULAR 03/23/2007   REACTIVE AIRWAY DISEASE 01/14/2010   SCHATZKI'S RING    SCIATICA, RIGHT 04/28/2008   Unspecified hypothyroidism 09/06/2013   Wheezing 04/17/2009    Past Surgical History:  Procedure Laterality Date   TONSILLECTOMY      Family History  Problem Relation Age of Onset   Lung cancer Brother        smoked   Cancer Other        lung cancer   Hypertension Other    Emphysema Brother        smoked   Asthma Sister    Social History   Tobacco Use   Smoking status: Never Smoker   Smokeless tobacco: Never Used   Tobacco comment: tried smoking in HS  Substance Use Topics   Alcohol use: No   Drug use: No    Prior to Admission medications   Medication Sig Start Date End Date Taking? Authorizing Provider  aspirin EC 81 MG tablet Take 1 tablet (81 mg total) by mouth daily. Patient taking differently: Take 81 mg by mouth 2 (two) times a week.  02/10/16  Yes Biagio Borg, MD  Cholecalciferol (VITAMIN D) 1000 UNITS  UNITS capsule Take 1,000 Units by mouth every other day.    Yes [provider]  desoximetasone (TOPICORT) 0.25 % cream APPLY SPARINGLY TO THE AFFECTED AREA TWICE DAILY AS NEEDED AS DIRECTED Patient taking differently: Apply 1 application topically 2 (two) times daily. Itching 04/30/18  Yes Biagio Borg, MD  lansoprazole (PREVACID) 30 MG capsule Take 1 capsule (30 mg total) by mouth daily at 12 noon. Patient taking differently: Take 30 mg by mouth daily as needed (heart burn).  09/20/18  Yes Biagio Borg, MD  levothyroxine (SYNTHROID, LEVOTHROID) 112 MCG tablet Take 1 tablet (112 mcg total) by mouth daily. 01/22/18  Yes Marrian Salvage, FNP  losartan (COZAAR) 100 MG tablet Take 1 tablet (100 mg  total) by mouth daily. 08/01/18  Yes Biagio Borg, MD  Multiple Vitamins-Minerals (CENTRUM SILVER PO) Take 1 tablet by mouth daily.    Yes [provider]  polyethylene glycol powder (MIRALAX) powder Take 1/2 capful by mouth once daily Patient taking differently: Take 17 g by mouth 2 (two) times a week.  01/28/15  Yes Biagio Borg, MD  tamsulosin (FLOMAX) 0.4 MG CAPS capsule Take 1 capsule (0.4 mg total) by mouth at bedtime. 12/24/18  Yes Biagio Borg, MD  vitamin B-12 (CYANOCOBALAMIN) 100 MCG tablet Take 100 mcg by mouth once a week.   Yes [provider]  vitamin C (ASCORBIC ACID) 500 MG tablet Take 1,000 mg by mouth daily.    Yes [provider]  albuterol (PROVENTIL HFA;VENTOLIN HFA) 108 (90 Base) MCG/ACT inhaler Inhale 2 puffs into the lungs every 6 (six) hours as needed for wheezing. 07/21/16 10/26/17  Biagio Borg, MD  ALPRAZolam Duanne Moron) 0.5 MG tablet Take 1 tablet (0.5 mg total) by mouth daily as needed for anxiety. Patient not taking: Reported on 12/28/2018 07/29/14   Biagio Borg, MD  Azelastine HCl 0.15 % SOLN Place 2 sprays into both nostrils 2 (two) times daily. Patient not taking: Reported on 12/28/2018 04/01/16   Harrison Mons, PA  budesonide-formoterol (SYMBICORT) 80-4.5 MCG/ACT inhaler Take 2 puffs first thing in am and then another 2 puffs about 12 hours later. Patient not taking: Reported on 12/28/2018 05/09/17   Tanda Rockers, MD  cetirizine (ZYRTEC) 10 MG tablet Take 1 tablet (10 mg total) by mouth daily. 08/10/17 08/10/18  Biagio Borg, MD  meclizine (ANTIVERT) 12.5 MG tablet Take 1 tablet (12.5 mg total) by mouth 3 (three) times daily as needed for dizziness. Patient not taking: Reported on 12/28/2018 01/19/18   Marrian Salvage, FNP  montelukast (SINGULAIR) 10 MG tablet Take 1 tablet (10 mg total) by mouth at bedtime. Patient not taking: Reported on 12/28/2018 08/10/17   Biagio Borg, MD  predniSONE (DELTASONE) 10 MG tablet 3 tabs by mouth per day  for 3 days,2tabs per day for 3 days,1tab per day for 3 days Patient not taking: Reported on 12/28/2018 04/09/18   Biagio Borg, MD  rosuvastatin (CRESTOR) 10 MG tablet TAKE 1 TABLET BY MOUTH EVERY DAY Patient not taking: Reported on 12/28/2018 10/26/17   Biagio Borg, MD  tiZANidine (ZANAFLEX) 2 MG tablet Take 1 tablet (2 mg total) by mouth every 6 (six) hours as needed for muscle spasms. Patient not taking: Reported on 12/28/2018 07/11/17   Biagio Borg, MD  traMADol (ULTRAM) 50 MG tablet Take 1 tablet (50 mg total) by mouth every 6 (six) hours as needed. Patient not taking: Reported on 12/28/2018 07/11/17  Biagio Borg, MD    Current Facility-Administered Medications  Medication Dose Route Frequency Provider Last Rate Last Dose   acetaminophen (TYLENOL) tablet 650 mg  650 mg Oral Q6H PRN Rise Patience, MD       Or   acetaminophen (TYLENOL) suppository 650 mg  650 mg Rectal Q6H PRN Rise Patience, MD       albuterol (VENTOLIN HFA) 108 (90 Base) MCG/ACT inhaler 2 puff  2 puff Inhalation Q6H PRN Rise Patience, MD       dextrose 5 %-0.45 % sodium chloride infusion   Intravenous Continuous Rai, Ripudeep K, MD       hydrALAZINE (APRESOLINE) injection 10 mg  10 mg Intravenous Q4H PRN Rise Patience, MD       influenza vaccine adjuvanted (FLUAD) injection 0.5 mL  0.5 mL Intramuscular Tomorrow-1000 Rise Patience, MD       levothyroxine (SYNTHROID, LEVOTHROID) injection 56 mcg  56 mcg Intravenous Daily Rai, Ripudeep K, MD       ondansetron (ZOFRAN) tablet 4 mg  4 mg Oral Q6H PRN Rise Patience, MD       Or   ondansetron [facility]) injection 4 mg  4 mg Intravenous Q6H PRN Rise Patience, MD       piperacillin-tazobactam (ZOSYN) IVPB 3.375 g  3.375 g Intravenous Q8H Rise Patience, MD 12.5 mL/hr at 12/28/18 0528 3.375 g at 12/28/18 0528   tamsulosin (FLOMAX) capsule 0.4 mg  0.4 mg Oral QHS Rai, Ripudeep K, MD        Allergies as of 12/27/2018 -  Review Complete 12/27/2018  Allergen Reaction Noted   Atorvastatin  03/16/2009   Doxazosin mesylate  03/23/2007   Hydrocodone-homatropine Other (See Comments) 04/19/2016     Review of Systems:    Constitutional: No weight loss, fever or chills Skin: No rash  Cardiovascular: No chest pain Respiratory: No SOB Gastrointestinal: See HPI and otherwise negative Genitourinary: No dysuria  Neurological: No headache, dizziness or syncope Musculoskeletal: No new muscle or joint pain Hematologic: No bleeding  Psychiatric: No history of depression or anxiety    Physical Exam:  Vital signs in last 24 hours: Temp:  [98.2 F (36.8 C)-99.6 F (37.6 C)] 99.6 F (37.6 C) (09/25 0550) Pulse Rate:  [86-116] 99 (09/25 0550) Resp:  [16-18] 16 (09/25 0550) BP: (154-192)/(87-128) 154/91 (09/25 0550) SpO2:  [93 %-98 %] 93 % (09/25 0550) Weight:  [88.5 kg] 88.5 kg (09/24 1636) Last BM Date: 12/25/18 General:   Pleasant Caucasian male appears to be in NAD, Well developed, Well nourished, alert and cooperative Head:  Normocephalic and atraumatic. Eyes:   PEERL, EOMI. No icterus. Conjunctiva pink. Ears:  Normal auditory acuity. Neck:  Supple Throat: Oral cavity and pharynx without inflammation, swelling or lesion. Teeth in good condition. Lungs: Respirations even and unlabored. Lungs clear to auscultation bilaterally.   No wheezes, crackles, or rhonchi.  Heart: Normal S1, S2. No MRG. Regular rate and rhythm. No peripheral edema, cyanosis or pallor.  Abdomen:  Soft, nondistended, nontender. No rebound or guarding. Normal bowel sounds. No appreciable masses or hepatomegaly. Rectal:  Not performed.  Msk:  Symmetrical without gross deformities. Peripheral pulses intact.  Extremities:  Without edema, no deformity or joint abnormality.  Neurologic:  Alert and  oriented x4;  grossly normal neurologically.  Skin:   Dry and intact without significant lesions or rashes.+ Jaundiced Psychiatric: Demonstrates  good judgement and reason without abnormal affect or behaviors.   LAB RESULTS:  Labs    12/27/18 1723 12/28/18 0205  WBC 15.4* 16.6*  HGB 13.2 12.4*  HCT 40.6 39.3  PLT 263 241   BMET Recent Labs    12/27/18 1723 12/28/18 0205  NA 135 140  K 4.6 5.1  CL 99 106  CO2 24 25  GLUCOSE 155* 121*  BUN 36* 35*  CREATININE 1.97* 1.97*  CALCIUM 9.6 9.2   LFT Recent Labs    12/28/18 0205  PROT 7.0  ALBUMIN 3.9  AST 68*  ALT 43  ALKPHOS 71  BILITOT 2.2*  BILIDIR 0.5*  IBILI 1.7*   STUDIES: Ct Abdomen Pelvis Wo Contrast  Result Date: 12/27/2018 CLINICAL DATA:  85 year old male with abdominal pain, nausea and vomiting. EXAM: CT ABDOMEN AND PELVIS WITHOUT CONTRAST TECHNIQUE: Multidetector CT imaging of the abdomen and pelvis was performed following the standard protocol without IV contrast. COMPARISON:  CT of the abdomen pelvis dated 02/15/2012 FINDINGS: Evaluation of this exam is limited in the absence of intravenous contrast. Lower chest: The visualized lung bases are clear. There is multi vessel coronary vascular calcification. No intra-abdominal free air or free fluid. Hepatobiliary: The liver is grossly unremarkable. There is moderate intrahepatic biliary ductal dilatation, new since the prior CT. There is a 13 mm stone in the central CBD. An additional 10 mm stone is noted in the region of the ampulla. The common bile duct is dilated and measures up to 2 cm. There is layering sludge or stones within the gallbladder. No pericholecystic fluid. Further evaluation with MRI/MRCP is recommended. Pancreas: The pancreas is unremarkable. Spleen: Normal in size without focal abnormality. Adrenals/Urinary Tract: The adrenal glands are unremarkable. Moderate left and mild right renal parenchyma atrophy. Multiple small nonobstructing bilateral renal calculi measure up to 4 mm in the upper pole of the right kidney. There is no hydronephrosis on either side. Several right renal hypodense  lesions with the largest measuring up to 5 cm from the inferior pole of the right kidney. These lesions are suboptimally characterized on this noncontrast CT however demonstrate fluid attenuation, likely cysts. The visualized ureters and urinary bladder appear unremarkable. Stomach/Bowel: There is sigmoid diverticulosis without active inflammatory changes. There is no bowel obstruction or active inflammation. The appendix is normal. Vascular/Lymphatic: There is advanced aortoiliac atherosclerotic disease. Ectatic aorta measures up to 2.5 cm. Dilated common iliac arteries bilaterally measure up to 17 mm on the right. The IVC is unremarkable on this noncontrast CT. No portal venous gas. There is no adenopathy. Reproductive: Enlarged prostate gland measuring 5 cm in transverse axial diameter. The seminal vesicles are symmetric. Other: None Musculoskeletal: Degenerative changes of the spine. No acute osseous pathology. IMPRESSION: 1. Obstructing stones in the central CBD/ampulla region with moderate intrahepatic and extrahepatic biliary ductal dilatation, new since the prior CT. Further evaluation with MRI/MRCP is recommended. 2. Small nonobstructing bilateral renal calculi. No hydronephrosis. 3. Sigmoid diverticulosis. No bowel obstruction or active inflammation. Normal appendix. Aortic Atherosclerosis (ICD10-I70.0). Electronically Signed   By: Anner Crete M.D.   On: 12/27/2018 19:34    Impression / Plan:   Impression: 1.  Abdominal pain with choledocholithiasis: CT showing choledocholithiasis, patient is not currently in any pain though previously was having epigastric discomfort, LFTs increasing overnight 2.  Increased LFTs: AST 25--> 68, ALT 17--> 43, alk phos 76--> 71, total bilirubin 1.6--> 2.2, likely from above 3.  COPD 4.  Chronic kidney disease stage III 5.  Leukocytosis:15.4--> 16.6, on Zosyn  Plan: 1.  Discussing plans for ERCP with Dr.  Danis. 2.  Patient to remain n.p.o. 3.  Agree with  antibiotics 4. Reordered rapis COVID test to be done within the hour for patient 5. Spoke with wife on the phone who consents to procedure for patient,would like to be called when it is done 6.  Please await any final recommendations from Dr. Loletha Carrow later today  Thank you for your kind consultation, we will continue to follow.  Lavone Nian [facility]  12/28/2018, 8:42 AM   I have reviewed the entire case in detail with the above APP and discussed the plan in detail.  Therefore, I agree with the diagnoses recorded above. In addition,  I have personally interviewed and examined the patient and have personally reviewed any abdominal/pelvic CT scan images.  My additional thoughts are as follows:  Choledocholithiasis, possible low-grade cholangitis with leukocytosis although he certainly does not appear septic.  ERCP is planned for today, exact timing depending upon COVID testing. It would be done by my partner Dr. Lucio Edward, with whom I have discussed the case as well.  I did explain this to Stephen Wilkins, but due to his dementia we also discussed everything with his wife Stephen Wilkins.  The benefits and risks of the planned procedure were described in detail with the patient or (when appropriate) their health care proxy.  Risks were outlined as including, but not limited to, bleeding, infection, perforation, adverse medication reaction leading to cardiac or pulmonary decompensation.  Pancreatitis can occur from ERCP. The limitation of incomplete mucosal visualization was also discussed.  No guarantees or warranties were given. Patient at increased risk for cardiopulmonary complications of procedure due to medical comorbidities.   Continue antibiotics in the meantime.  Nelida Meuse III Office:[phone]

## 2018-12-28 NOTE — Anesthesia Procedure Notes (Signed)
Procedure Name: Intubation Date/Time: 12/28/2018 12:06 PM Performed by: Claudia Desanctis, CRNA Pre-anesthesia Checklist: Patient identified, Emergency Drugs available, Suction available and Patient being monitored Patient Re-evaluated:Patient Re-evaluated prior to induction Oxygen Delivery Method: Circle system utilized Preoxygenation: Pre-oxygenation with 100% oxygen Induction Type: IV induction Ventilation: Mask ventilation without difficulty Laryngoscope Size: Miller and 3 Grade View: Grade I Tube type: Oral Tube size: 7.5 mm Number of attempts: 1 Airway Equipment and Method: Stylet Placement Confirmation: ETT inserted through vocal cords under direct vision,  positive ETCO2 and breath sounds checked- equal and bilateral Secured at: 21 cm Tube secured with: Tape Dental Injury: Teeth and Oropharynx as per pre-operative assessment

## 2018-12-29 DIAGNOSIS — R945 Abnormal results of liver function studies: Secondary | ICD-10-CM

## 2018-12-29 DIAGNOSIS — K8309 Other cholangitis: Secondary | ICD-10-CM

## 2018-12-29 DIAGNOSIS — J411 Mucopurulent chronic bronchitis: Secondary | ICD-10-CM

## 2018-12-29 DIAGNOSIS — K8030 Calculus of bile duct with cholangitis, unspecified, without obstruction: Secondary | ICD-10-CM

## 2018-12-29 LAB — CBC
HCT: 33.5 % — ABNORMAL LOW (ref 39.0–52.0)
Hemoglobin: 10.5 g/dL — ABNORMAL LOW (ref 13.0–17.0)
MCH: 31 pg (ref 26.0–34.0)
MCHC: 31.3 g/dL (ref 30.0–36.0)
MCV: 98.8 fL (ref 80.0–100.0)
Platelets: 214 10*3/uL (ref 150–400)
RBC: 3.39 MIL/uL — ABNORMAL LOW (ref 4.22–5.81)
RDW: 11.9 % (ref 11.5–15.5)
WBC: 13 10*3/uL — ABNORMAL HIGH (ref 4.0–10.5)
nRBC: 0 % (ref 0.0–0.2)

## 2018-12-29 LAB — COMPREHENSIVE METABOLIC PANEL
ALT: 33 U/L (ref 0–44)
AST: 42 U/L — ABNORMAL HIGH (ref 15–41)
Albumin: 3.6 g/dL (ref 3.5–5.0)
Alkaline Phosphatase: 53 U/L (ref 38–126)
Anion gap: 8 (ref 5–15)
BUN: 37 mg/dL — ABNORMAL HIGH (ref 8–23)
CO2: 23 mmol/L (ref 22–32)
Calcium: 8.7 mg/dL — ABNORMAL LOW (ref 8.9–10.3)
Chloride: 106 mmol/L (ref 98–111)
Creatinine, Ser: 2.42 mg/dL — ABNORMAL HIGH (ref 0.61–1.24)
GFR calc Af Amer: 27 mL/min — ABNORMAL LOW (ref >60)
GFR calc non Af Amer: 23 mL/min — ABNORMAL LOW (ref >60)
Glucose, Bld: 132 mg/dL — ABNORMAL HIGH (ref 70–99)
Potassium: 5.1 mmol/L (ref 3.5–5.1)
Sodium: 137 mmol/L (ref 135–145)
Total Bilirubin: 1.3 mg/dL — ABNORMAL HIGH (ref 0.3–1.2)
Total Protein: 6.3 g/dL — ABNORMAL LOW (ref 6.5–8.1)

## 2018-12-29 LAB — TSH: TSH: 0.595 u[IU]/mL (ref 0.350–4.500)

## 2018-12-29 MED ORDER — LEVOTHYROXINE SODIUM 112 MCG PO TABS
112.0000 ug | ORAL_TABLET | Freq: Every day | ORAL | Status: DC
  Administered 2018-12-30 – 2019-01-01 (×3): 112 ug via ORAL
  Filled 2018-12-29 (×3): qty 1

## 2018-12-29 MED ORDER — SODIUM CHLORIDE 0.9 % IV SOLN
INTRAVENOUS | Status: DC
  Administered 2018-12-29 – 2019-01-01 (×4): via INTRAVENOUS

## 2018-12-29 NOTE — Progress Notes (Signed)
[facility]                                                                              Patient Demographics  Rembert Browe, is a 85 y.o. male, DOB - 1928/03/18, JIR:[id number]  Admit date - 12/27/2018   Admitting Physician Rise Patience, MD  Outpatient Primary MD for the patient is Biagio Borg, MD  Outpatient specialists:   LOS - 2  days   Medical records reviewed and are as summarized below:    Chief Complaint  Patient presents with  . Abdominal Pain       Brief summary   Patient is a 85 year old male with history of hypertension, hypothyroidism, asthma/COPD, early dementia presented with abdominal pain off and on for the last few days, acutely worsened over the last 2 days.  Patient also reported nausea and vomiting.  CT abdomen and pelvis showed obstructing stones in the CBD with intrahepatic and extrahepatic biliary duct dilatation.  GI consulted, patient admitted for further work-up.   Assessment & Plan    Principal Problem: Acute abdominal pain secondary to choledocholithiasis, cholangitis -CT abdomen showed obstructing stones in CBD, intrahepatic and extrahepatic biliary duct dilatation -Patient was placed on IV fluids, n.p.o., IV Zosyn -Gastroenterology was consulted, patient underwent ERCP on 9/25, showed multiple stones and sludge on the cholangiogram, CBD was moderately dilated with stones causing an obstruction relieved with biliary sphincterotomy and balloon extraction -Advance diet to soft solids today -GI recommended 5 to 7 days of antibiotics oral ciprofloxacin as an outpatient.  If blood cultures positive he will need a longer course, blood cultures so far negative.  Active Problems:   Essential hypertension -BP currently stable, not on any antihypertensives  Early dementia (HCC) -Currently stable, no acute issues    COPD (chronic obstructive pulmonary disease) (HCC) -No wheezing, currently stable, placed on albuterol nebs as  needed  Acute on chronic CKD stage III -Creatinine baseline, 1.9 -Creatinine trended up to 2.4 today possibly due to cholangitis -Placed on IV fluid hydration, recheck be met in a.m.   Hypothyroidism -TSH 0.5, patient now on diet, will restart oral Synthroid  BPH Continue tamsulosin  Code Status: DNR DVT Prophylaxis:  SCDs Family Communication: Discussed all imaging results, lab results, explained to the patient and wife    Disposition Plan: Possible DC home in a.m. if creatinine improving on oral antibiotics  Time Spent in minutes 35 minutes  Procedures:  CT abdomen  Consultants:   Gastroenterology  Antimicrobials:   Anti-infectives (From admission, onward)   Start     Dose/Rate Route Frequency Ordered Stop   12/28/18 0600  piperacillin-tazobactam (ZOSYN) IVPB 3.375 g     3.375 g 12.5 mL/hr over 240 Minutes Intravenous Every 8 hours 12/28/18 0132     12/27/18 2100  piperacillin-tazobactam (ZOSYN) IVPB 3.375 g     3.375 g 100 mL/hr over 30 Minutes Intravenous  Once 12/27/18 2056 12/27/18 2149         Medications  Scheduled Meds: . levothyroxine  56 mcg Intravenous Daily  . tamsulosin  0.4 mg Oral QHS   Continuous Infusions: . sodium chloride 125 mL/hr at  12/29/18 1450  . piperacillin-tazobactam (ZOSYN)  IV 3.375 g (12/29/18 1447)   PRN Meds:.acetaminophen **OR** acetaminophen, albuterol, hydrALAZINE, ondansetron **OR** ondansetron (ZOFRAN) IV      Subjective:   Jabbar Palmero was seen and examined today.  No acute issues this morning, abdominal pain improving, no fevers or chills.  On clear liquid diet.  Patient denies dizziness, chest pain, shortness of breath, new weakness, numbess, tingling. No acute events overnight.    Objective:   Vitals:   12/28/18 1335 12/28/18 1403 12/29/18 0539 12/29/18 1448  BP:  139/78 120/65 124/70  Pulse: 84 82 (!) 58 64  Resp: '17 20 18 '$ (!) 21  Temp:  98 F (36.7 C) 98 F (36.7 C) (!) 97.5 F (36.4 C)  TempSrc:   Oral Oral Oral  SpO2: 92% 91% 94% 96%  Weight:      Height:        Intake/Output Summary (Last 24 hours) at 12/29/2018 1935 Last data filed at 12/29/2018 1700 Gross per 24 hour  Intake 1826.32 ml  Output 1400 ml  Net 426.32 ml     Wt Readings from Last 3 Encounters:  12/27/18 88.5 kg  04/09/18 93 kg  01/19/18 91.6 kg   Physical Exam  General: Alert and oriented x 3, NAD  Eyes:   HEENT:  Atraumatic, normocephalic  Cardiovascular: S1 S2 clear, RRR. No pedal edema b/l  Respiratory: CTAB, no wheezing, rales or rhonchi  Gastrointestinal: Soft, nontender, nondistended, NBS  Ext: no pedal edema bilaterally  Neuro: no new deficits  Musculoskeletal: No cyanosis, clubbing  Skin: No rashes  Psych: Normal affect and demeanor, alert and oriented x3     Data Reviewed:  I have personally reviewed following labs and imaging studies  Micro Results Recent Results (from the past 240 hour(s))  Culture, blood (routine x 2)     Status: None (Preliminary result)   Collection Time: 12/28/18  2:05 AM   Specimen: BLOOD  Result Value Ref Range Status   Specimen Description   Final    BLOOD RIGHT HAND Performed at [facility] [address], [place], [place] [address]    Special Requests   Final    BOTTLES DRAWN AEROBIC AND ANAEROBIC Blood Culture adequate volume Performed at [facility] [address] [place], [place] [address]    Culture   Final    NO GROWTH 1 DAY Performed at [facility], [place] [address], [place], [place] [address]    Report Status PENDING  Incomplete  Culture, blood (routine x 2)     Status: None (Preliminary result)   Collection Time: 12/28/18  2:05 AM   Specimen: BLOOD  Result Value Ref Range Status   Specimen Description   Final    BLOOD LEFT HAND Performed at [facility] [address] [place], [place] [address]    Special Requests   Final    BOTTLES DRAWN AEROBIC ONLY Blood  Culture adequate volume Performed at [facility] [address] [place], [place] [address]    Culture   Final    NO GROWTH 1 DAY Performed at [facility], [place] [address], [place], [place] [address]    Report Status PENDING  Incomplete  SARS Coronavirus 2 [facility] order, Performed in [facility] hospital lab)     Status: None   Collection Time: 12/28/18 10:26 AM  Result Value Ref Range Status   SARS Coronavirus 2 NEGATIVE NEGATIVE Final  Comment: (NOTE) If result is NEGATIVE SARS-CoV-2 target nucleic acids are NOT DETECTED. The SARS-CoV-2 RNA is generally detectable in upper and lower  respiratory specimens during the acute phase of infection. The lowest  concentration of SARS-CoV-2 viral copies this assay can detect is 250  copies / mL. A negative result does not preclude SARS-CoV-2 infection  and should not be used as the sole basis for treatment or other  patient management decisions.  A negative result may occur with  improper specimen collection / handling, submission of specimen other  than nasopharyngeal swab, presence of viral mutation(s) within the  areas targeted by this assay, and inadequate number of viral copies  (<250 copies / mL). A negative result must be combined with clinical  observations, patient history, and epidemiological information. If result is POSITIVE SARS-CoV-2 target nucleic acids are DETECTED. The SARS-CoV-2 RNA is generally detectable in upper and lower  respiratory specimens dur ing the acute phase of infection.  Positive  results are indicative of active infection with SARS-CoV-2.  Clinical  correlation with patient history and other diagnostic information is  necessary to determine patient infection status.  Positive results do  not rule out bacterial infection or co-infection with other viruses. If result is PRESUMPTIVE POSTIVE SARS-CoV-2 nucleic acids MAY BE PRESENT.   A presumptive positive result was obtained  on the submitted specimen  and confirmed on repeat testing.  While 2019 novel coronavirus  (SARS-CoV-2) nucleic acids may be present in the submitted sample  additional confirmatory testing may be necessary for epidemiological  and / or clinical management purposes  to differentiate between  SARS-CoV-2 and other Sarbecovirus currently known to infect humans.  If clinically indicated additional testing with an alternate test  methodology [id number]) is advised. The SARS-CoV-2 RNA is generally  detectable in upper and lower respiratory sp ecimens during the acute  phase of infection. The expected result is Negative. Fact Sheet for Patients:  StrictlyIdeas.no Fact Sheet for Healthcare Providers: BankingDealers.co.za This test is not yet approved or cleared by the [place] FDA and has been authorized for detection and/or diagnosis of SARS-CoV-2 by FDA under an Emergency Use Authorization (EUA).  This EUA will remain in effect (meaning this test can be used) for the duration of the COVID-19 declaration under Section 564(b)(1) of the Act, 21 U.S.C. section 360bbb-3(b)(1), unless the authorization is terminated or revoked sooner. Performed at [facility], [place] [address], [place], [place] [address]     Radiology Reports Ct Abdomen Pelvis Wo Contrast  Result Date: 12/27/2018 CLINICAL DATA:  85 year old male with abdominal pain, nausea and vomiting. EXAM: CT ABDOMEN AND PELVIS WITHOUT CONTRAST TECHNIQUE: Multidetector CT imaging of the abdomen and pelvis was performed following the standard protocol without IV contrast. COMPARISON:  CT of the abdomen pelvis dated 02/15/2012 FINDINGS: Evaluation of this exam is limited in the absence of intravenous contrast. Lower chest: The visualized lung bases are clear. There is multi vessel coronary vascular calcification. No intra-abdominal free air or free fluid. Hepatobiliary: The liver  is grossly unremarkable. There is moderate intrahepatic biliary ductal dilatation, new since the prior CT. There is a 13 mm stone in the central CBD. An additional 10 mm stone is noted in the region of the ampulla. The common bile duct is dilated and measures up to 2 cm. There is layering sludge or stones within the gallbladder. No pericholecystic fluid. Further evaluation with MRI/MRCP is recommended. Pancreas: The pancreas is unremarkable. Spleen: Normal in size without focal abnormality.  Adrenals/Urinary Tract: The adrenal glands are unremarkable. Moderate left and mild right renal parenchyma atrophy. Multiple small nonobstructing bilateral renal calculi measure up to 4 mm in the upper pole of the right kidney. There is no hydronephrosis on either side. Several right renal hypodense lesions with the largest measuring up to 5 cm from the inferior pole of the right kidney. These lesions are suboptimally characterized on this noncontrast CT however demonstrate fluid attenuation, likely cysts. The visualized ureters and urinary bladder appear unremarkable. Stomach/Bowel: There is sigmoid diverticulosis without active inflammatory changes. There is no bowel obstruction or active inflammation. The appendix is normal. Vascular/Lymphatic: There is advanced aortoiliac atherosclerotic disease. Ectatic aorta measures up to 2.5 cm. Dilated common iliac arteries bilaterally measure up to 17 mm on the right. The IVC is unremarkable on this noncontrast CT. No portal venous gas. There is no adenopathy. Reproductive: Enlarged prostate gland measuring 5 cm in transverse axial diameter. The seminal vesicles are symmetric. Other: None Musculoskeletal: Degenerative changes of the spine. No acute osseous pathology. IMPRESSION: 1. Obstructing stones in the central CBD/ampulla region with moderate intrahepatic and extrahepatic biliary ductal dilatation, new since the prior CT. Further evaluation with MRI/MRCP is recommended. 2. Small  nonobstructing bilateral renal calculi. No hydronephrosis. 3. Sigmoid diverticulosis. No bowel obstruction or active inflammation. Normal appendix. Aortic Atherosclerosis (ICD10-I70.0). Electronically Signed   By: Anner Crete M.D.   On: 12/27/2018 19:34   Dg Ercp Biliary & Pancreatic Ducts  Result Date: 12/28/2018 CLINICAL DATA:  Intraop balloon occlusive retrograde cholangiogram with biliary sweeping and successful CBD stone removal. EXAM: ERCP TECHNIQUE: Multiple spot images obtained with the fluoroscopic device and submitted for interpretation post-procedure. COMPARISON:  CT abdomen pelvis-12/27/2018 FINDINGS: An ERCP probe overlies the right upper abdominal quadrant. There is selective cannulation opacification of the common bile duct which appears moderately dilated. There are 2 persistent filling defects within mid aspect of the CBD suggestive of the questioned choledocholithiasis on preceding abdominal CT scan. Subsequent image demonstrates insufflation of a balloon within the central aspect of the CBD with subsequent sweeping and biliary sphincterotomy. There is minimal opacification of the intrahepatic biliary tree which appears moderately dilated. There is no definitive opacification either the cystic or pancreatic ducts. IMPRESSION: ERCP with findings of choledocholithiasis with subsequent biliary sweeping and sphincterotomy. These images were submitted for radiologic interpretation only. Please see the procedural report for the amount of contrast and the fluoroscopy time utilized. Electronically Signed   By: Sandi Mariscal M.D.   On: 12/28/2018 13:10    Lab Data:  CBC: Recent Labs  Lab 12/27/18 1723 12/28/18 0205 12/29/18 0442  WBC 15.4* 16.6* 13.0*  NEUTROABS 12.8* 13.5*  --   HGB 13.2 12.4* 10.5*  HCT 40.6 39.3 33.5*  MCV 95.1 97.8 98.8  PLT 263 241 657   Basic Metabolic Panel: Recent Labs  Lab 12/27/18 1723 12/28/18 0205 12/29/18 0442  NA 135 140 137  K 4.6 5.1 5.1  CL  99 106 106  CO2 '24 25 23  '$ GLUCOSE 155* 121* 132*  BUN 36* 35* 37*  CREATININE 1.97* 1.97* 2.42*  CALCIUM 9.6 9.2 8.7*   GFR: Estimated Creatinine Clearance: 23.2 mL/min (A) (by C-G formula based on SCr of 2.42 mg/dL (H)). Liver Function Tests: Recent Labs  Lab 12/27/18 1723 12/28/18 0205 12/29/18 0442  AST 25 68* 42*  ALT 17 43 33  ALKPHOS 76 71 53  BILITOT 1.6* 2.2* 1.3*  PROT 7.7 7.0 6.3*  ALBUMIN 4.2 3.9 3.6   Recent  Labs  Lab 12/27/18 1723  LIPASE 19   No results for input(s): AMMONIA in the last 168 hours. Coagulation Profile: No results for input(s): INR, PROTIME in the last 168 hours. Cardiac Enzymes: No results for input(s): CKTOTAL, CKMB, CKMBINDEX, TROPONINI in the last 168 hours. BNP (last 3 results) No results for input(s): PROBNP in the last 8760 hours. HbA1C: No results for input(s): HGBA1C in the last 72 hours. CBG: No results for input(s): GLUCAP in the last 168 hours. Lipid Profile: No results for input(s): CHOL, HDL, LDLCALC, TRIG, CHOLHDL, LDLDIRECT in the last 72 hours. Thyroid Function Tests: Recent Labs    12/29/18 0442  TSH 0.595   Anemia Panel: No results for input(s): VITAMINB12, FOLATE, FERRITIN, TIBC, IRON, RETICCTPCT in the last 72 hours. Urine analysis:    Component Value Date/Time   COLORURINE YELLOW 12/27/2018 1953   APPEARANCEUR CLEAR 12/27/2018 1953   LABSPEC 1.025 12/27/2018 1953   PHURINE 6.0 12/27/2018 1953   GLUCOSEU NEGATIVE 12/27/2018 1953   GLUCOSEU NEGATIVE 08/29/2017 1601   HGBUR SMALL (A) 12/27/2018 1953   BILIRUBINUR NEGATIVE 12/27/2018 1953   KETONESUR NEGATIVE 12/27/2018 1953   PROTEINUR 100 (A) 12/27/2018 1953   UROBILINOGEN 0.2 08/29/2017 1601   NITRITE NEGATIVE 12/27/2018 1953   LEUKOCYTESUR NEGATIVE 12/27/2018 1953     Ripudeep Rai M.D. [facility] 12/29/2018, 7:35 PM  Pager: [phone] Between 7am to 7pm - call Pager - [phone]  After 7pm go to [email or web address] - password [email or web address]  Call  night coverage person covering after 7pm

## 2018-12-29 NOTE — Progress Notes (Addendum)
Progress Note   Subjective  Chief Complaint: Choledocholithiasis, status post ERCP 08/27/2018  This morning patient is in good spirits.  Tells me he has no abdominal pain, has not had a bowel movement yet but "hears some rumbling in there".  Tolerating his diet.   Review of systems: No chest pain, dyspnea or dysuria  Objective   Vital signs in last 24 hours: Temp:  [97.9 F (36.6 C)-99.1 F (37.3 C)] 98 F (36.7 C) (09/26 0539) Pulse Rate:  [58-90] 58 (09/26 0539) Resp:  [13-25] 18 (09/26 0539) BP: (112-162)/(63-81) 120/65 (09/26 0539) SpO2:  [91 %-100 %] 94 % (09/26 0539) Last BM Date: 12/25/18 General:    white male in NAD Heart:  Regular rate and rhythm; no murmurs Lungs: Respirations even and unlabored, lungs CTA bilaterally Abdomen:  Soft, nontender and nondistended. Normal bowel sounds. Extremities:  Without edema. Neurologic:  Alert and oriented,  grossly normal neurologically. Psych:  Cooperative. Normal mood and affect.  Intake/Output from previous day: 09/25 0701 - 09/26 0700 In: 1992.7 [I.V.:1678.7; IV Piggyback:314] Out: 1400 [Urine:1400]  Lab Results: Recent Labs    12/27/18 1723 12/28/18 0205 12/29/18 0442  WBC 15.4* 16.6* 13.0*  HGB 13.2 12.4* 10.5*  HCT 40.6 39.3 33.5*  PLT 263 241 214   BMET Recent Labs    12/27/18 1723 12/28/18 0205 12/29/18 0442  NA 135 140 137  K 4.6 5.1 5.1  CL 99 106 106  CO2 24 25 23   GLUCOSE 155* 121* 132*  BUN 36* 35* 37*  CREATININE 1.97* 1.97* 2.42*  CALCIUM 9.6 9.2 8.7*   LFT Recent Labs    12/28/18 0205 12/29/18 0442  PROT 7.0 6.3*  ALBUMIN 3.9 3.6  AST 68* 42*  ALT 43 33  ALKPHOS 71 53  BILITOT 2.2* 1.3*  BILIDIR 0.5*  --   IBILI 1.7*  --     Studies/Results: Ct Abdomen Pelvis Wo Contrast  Result Date: 12/27/2018 CLINICAL DATA:  85 year old male with abdominal pain, nausea and vomiting. EXAM: CT ABDOMEN AND PELVIS WITHOUT CONTRAST TECHNIQUE: Multidetector CT imaging of the abdomen and  pelvis was performed following the standard protocol without IV contrast. COMPARISON:  CT of the abdomen pelvis dated 02/15/2012 FINDINGS: Evaluation of this exam is limited in the absence of intravenous contrast. Lower chest: The visualized lung bases are clear. There is multi vessel coronary vascular calcification. No intra-abdominal free air or free fluid. Hepatobiliary: The liver is grossly unremarkable. There is moderate intrahepatic biliary ductal dilatation, new since the prior CT. There is a 13 mm stone in the central CBD. An additional 10 mm stone is noted in the region of the ampulla. The common bile duct is dilated and measures up to 2 cm. There is layering sludge or stones within the gallbladder. No pericholecystic fluid. Further evaluation with MRI/MRCP is recommended. Pancreas: The pancreas is unremarkable. Spleen: Normal in size without focal abnormality. Adrenals/Urinary Tract: The adrenal glands are unremarkable. Moderate left and mild right renal parenchyma atrophy. Multiple small nonobstructing bilateral renal calculi measure up to 4 mm in the upper pole of the right kidney. There is no hydronephrosis on either side. Several right renal hypodense lesions with the largest measuring up to 5 cm from the inferior pole of the right kidney. These lesions are suboptimally characterized on this noncontrast CT however demonstrate fluid attenuation, likely cysts. The visualized ureters and urinary bladder appear unremarkable. Stomach/Bowel: There is sigmoid diverticulosis without active inflammatory changes. There is no bowel obstruction or active  inflammation. The appendix is normal. Vascular/Lymphatic: There is advanced aortoiliac atherosclerotic disease. Ectatic aorta measures up to 2.5 cm. Dilated common iliac arteries bilaterally measure up to 17 mm on the right. The IVC is unremarkable on this noncontrast CT. No portal venous gas. There is no adenopathy. Reproductive: Enlarged prostate gland measuring  5 cm in transverse axial diameter. The seminal vesicles are symmetric. Other: None Musculoskeletal: Degenerative changes of the spine. No acute osseous pathology. IMPRESSION: 1. Obstructing stones in the central CBD/ampulla region with moderate intrahepatic and extrahepatic biliary ductal dilatation, new since the prior CT. Further evaluation with MRI/MRCP is recommended. 2. Small nonobstructing bilateral renal calculi. No hydronephrosis. 3. Sigmoid diverticulosis. No bowel obstruction or active inflammation. Normal appendix. Aortic Atherosclerosis (ICD10-I70.0). Electronically Signed   By: Anner Crete M.D.   On: 12/27/2018 19:34   Dg Ercp Biliary & Pancreatic Ducts  Result Date: 12/28/2018 CLINICAL DATA:  Intraop balloon occlusive retrograde cholangiogram with biliary sweeping and successful CBD stone removal. EXAM: ERCP TECHNIQUE: Multiple spot images obtained with the fluoroscopic device and submitted for interpretation post-procedure. COMPARISON:  CT abdomen pelvis-12/27/2018 FINDINGS: An ERCP probe overlies the right upper abdominal quadrant. There is selective cannulation opacification of the common bile duct which appears moderately dilated. There are 2 persistent filling defects within mid aspect of the CBD suggestive of the questioned choledocholithiasis on preceding abdominal CT scan. Subsequent image demonstrates insufflation of a balloon within the central aspect of the CBD with subsequent sweeping and biliary sphincterotomy. There is minimal opacification of the intrahepatic biliary tree which appears moderately dilated. There is no definitive opacification either the cystic or pancreatic ducts. IMPRESSION: ERCP with findings of choledocholithiasis with subsequent biliary sweeping and sphincterotomy. These images were submitted for radiologic interpretation only. Please see the procedural report for the amount of contrast and the fluoroscopy time utilized. Electronically Signed   By: Sandi Mariscal M.D.   On: 12/28/2018 13:10     Assessment / Plan:   Assessment: 1.  Abdominal pain with choledocholithiasis: ERCP 12/28/2018 with sphincterotomy 2.  Elevated LFTs: Downtrending after ERCP 3.  COPD 4.  CKD stage III 5.  Leukocytosis: Improved  Plan: 1.  Continue current supportive measures 2.  Continue soft diet 3.  Continue antibiotics 4.  Continue to trend LFTs 5.  Please await any further recommendations from Dr. Loletha Carrow later today  Thank you for kind consultation, we will continue to follow hospitalized.   LOS: 2 days   Levin Erp  12/29/2018, 11:14 AM  I have discussed the case with the PA, and that is the plan I formulated. I personally interviewed and examined the patient.  Choledocholithiasis with low-grade cholangitis with pus removed from bile duct along with stones during yesterday's ERCP. He is symptomatically improved, LFTs are improving as well, and he looks well.  If he is still feeling well tomorrow, he can be discharged home to follow-up with [place] as needed.  He should have 5 to 7 days of course antibiotics, oral ciprofloxacin as an outpatient would be sufficient.  If his blood cultures come up positive, he will need a longer course as determined by primary medical service.  At his age, with endoscopic relief of the stone impaction and jaundiced, and no cholecystitis clinically or on imaging, I would not pursue cholecystectomy for this patient. Signing off, call as needed.  Total time 25 minutes.  Nelida Meuse III Office: [phone]

## 2018-12-30 ENCOUNTER — Inpatient Hospital Stay (HOSPITAL_COMMUNITY): Payer: PPO

## 2018-12-30 LAB — CBC
HCT: 31.8 % — ABNORMAL LOW (ref 39.0–52.0)
Hemoglobin: 10.1 g/dL — ABNORMAL LOW (ref 13.0–17.0)
MCH: 31.4 pg (ref 26.0–34.0)
MCHC: 31.8 g/dL (ref 30.0–36.0)
MCV: 98.8 fL (ref 80.0–100.0)
Platelets: 217 10*3/uL (ref 150–400)
RBC: 3.22 MIL/uL — ABNORMAL LOW (ref 4.22–5.81)
RDW: 12.1 % (ref 11.5–15.5)
WBC: 11.1 10*3/uL — ABNORMAL HIGH (ref 4.0–10.5)
nRBC: 0 % (ref 0.0–0.2)

## 2018-12-30 LAB — BASIC METABOLIC PANEL
Anion gap: 7 (ref 5–15)
Anion gap: 7 (ref 5–15)
BUN: 42 mg/dL — ABNORMAL HIGH (ref 8–23)
BUN: 42 mg/dL — ABNORMAL HIGH (ref 8–23)
CO2: 22 mmol/L (ref 22–32)
CO2: 23 mmol/L (ref 22–32)
Calcium: 8.2 mg/dL — ABNORMAL LOW (ref 8.9–10.3)
Calcium: 8.6 mg/dL — ABNORMAL LOW (ref 8.9–10.3)
Chloride: 112 mmol/L — ABNORMAL HIGH (ref 98–111)
Chloride: 112 mmol/L — ABNORMAL HIGH (ref 98–111)
Creatinine, Ser: 2.57 mg/dL — ABNORMAL HIGH (ref 0.61–1.24)
Creatinine, Ser: 2.48 mg/dL — ABNORMAL HIGH (ref 0.61–1.24)
GFR calc Af Amer: 25 mL/min — ABNORMAL LOW (ref >60)
GFR calc Af Amer: 26 mL/min — ABNORMAL LOW (ref >60)
GFR calc non Af Amer: 21 mL/min — ABNORMAL LOW (ref >60)
GFR calc non Af Amer: 22 mL/min — ABNORMAL LOW (ref >60)
Glucose, Bld: 107 mg/dL — ABNORMAL HIGH (ref 70–99)
Glucose, Bld: 108 mg/dL — ABNORMAL HIGH (ref 70–99)
Potassium: 4.8 mmol/L (ref 3.5–5.1)
Potassium: 4.6 mmol/L (ref 3.5–5.1)
Sodium: 141 mmol/L (ref 135–145)
Sodium: 142 mmol/L (ref 135–145)

## 2018-12-30 NOTE — Progress Notes (Signed)
[facility]                                                                              Patient Demographics  Stephen Wilkins, is a 85 y.o. male, DOB - 1928-01-28, HFW:[id number]  Admit date - 12/27/2018   Admitting Physician Rise Patience, MD  Outpatient Primary MD for the patient is Biagio Borg, MD  Outpatient specialists:   LOS - 3  days   Medical records reviewed and are as summarized below:    Chief Complaint  Patient presents with   Abdominal Pain       Brief summary   Patient is a 85 year old male with history of hypertension, hypothyroidism, asthma/COPD, early dementia presented with abdominal pain off and on for the last few days, acutely worsened over the last 2 days.  Patient also reported nausea and vomiting.  CT abdomen and pelvis showed obstructing stones in the CBD with intrahepatic and extrahepatic biliary duct dilatation.  GI consulted, patient admitted for further work-up.   Assessment & Plan    Principal Problem: Acute abdominal pain secondary to choledocholithiasis, cholangitis -CT abdomen showed obstructing stones in CBD, intrahepatic and extrahepatic biliary duct dilatation -Patient was placed on IV fluids, n.p.o., IV Zosyn -Gastroenterology was consulted, patient underwent ERCP on 9/25, showed multiple stones and sludge on the cholangiogram, CBD was moderately dilated with stones causing an obstruction relieved with biliary sphincterotomy and balloon extraction -Advance diet to soft solids today -GI recommended 5 to 7 days of antibiotics oral ciprofloxacin as an outpatient.  If blood cultures positive he will need a longer course, blood cultures so far negative.  Active Problems:   Essential hypertension -BP currently stable, not on any antihypertensives  Early dementia (HCC) -Currently stable, no acute issues    COPD (chronic obstructive pulmonary disease) (HCC) -No wheezing, currently stable, placed on albuterol nebs as  needed  Acute on chronic CKD stage III -Creatinine baseline, 1.9 -Creatinine trended up and appears to have plateaued -Continue IV fluids at 75 ml/hr and recheck BMP in AM  Hypothyroidism -TSH 0.5, patient now on diet, Continue Synthroid  BPH Continue tamsulosin  Code Status: DNR DVT Prophylaxis:  SCDs Family Communication: Wife on telephone   Disposition Plan: Discharge home tomorrow if creatinine continues to improve   Procedures:  CT abdomen  Consultants:   Gastroenterology  Antimicrobials:   Anti-infectives (From admission, onward)   Start     Dose/Rate Route Frequency Ordered Stop   12/28/18 0600  piperacillin-tazobactam (ZOSYN) IVPB 3.375 g     3.375 g 12.5 mL/hr over 240 Minutes Intravenous Every 8 hours 12/28/18 0132     12/27/18 2100  piperacillin-tazobactam (ZOSYN) IVPB 3.375 g     3.375 g 100 mL/hr over 30 Minutes Intravenous  Once 12/27/18 2056 12/27/18 2149         Medications  Scheduled Meds:  levothyroxine  112 mcg Oral Daily   tamsulosin  0.4 mg Oral QHS   Continuous Infusions:  sodium chloride 125 mL/hr at 12/30/18 0200   piperacillin-tazobactam (ZOSYN)  IV 3.375 g (12/30/18 0525)   PRN Meds:.acetaminophen **OR** acetaminophen, albuterol, hydrALAZINE, ondansetron **OR** ondansetron (  ZOFRAN) IV      Subjective:   No issues this morning. Ready to go home.    Objective:   Vitals:   12/29/18 0539 12/29/18 1448 12/29/18 2044 12/30/18 0437  BP: 120/65 124/70 132/72 126/71  Pulse: (!) 58 64 66 66  Resp: 18 (!) 21 16 18   Temp: 98 F (36.7 C) (!) 97.5 F (36.4 C) 98.4 F (36.9 C) 98.1 F (36.7 C)  TempSrc: Oral Oral Oral Oral  SpO2: 94% 96% 97% 95%  Weight:      Height:        Intake/Output Summary (Last 24 hours) at 12/30/2018 1532 Last data filed at 12/30/2018 0458 Gross per 24 hour  Intake 2526.07 ml  Output 1500 ml  Net 1026.07 ml     Wt Readings from Last 3 Encounters:  12/27/18 88.5 kg  04/09/18 93 kg    01/19/18 91.6 kg   General exam: Appears calm and comfortable Respiratory system: Clear to auscultation. Respiratory effort normal. Cardiovascular system: S1 & S2 heard, RRR. No murmurs, rubs, gallops or clicks. Gastrointestinal system: Abdomen is nondistended, soft and nontender. No organomegaly or masses felt. Normal bowel sounds heard. Central nervous system: Alert and oriented. No focal neurological deficits. Extremities: No edema. No calf tenderness Skin: No cyanosis. No rashes  Psychiatry: Judgement and insight appear normal. Mood & affect appropriate.     Data Reviewed:  I have personally reviewed following labs and imaging studies  Micro Results Recent Results (from the past 240 hour(s))  Culture, blood (routine x 2)     Status: None (Preliminary result)   Collection Time: 12/28/18  2:05 AM   Specimen: BLOOD  Result Value Ref Range Status   Specimen Description   Final    BLOOD RIGHT HAND Performed at [facility] [address] [place], [place] [address]    Special Requests   Final    BOTTLES DRAWN AEROBIC AND ANAEROBIC Blood Culture adequate volume Performed at [facility] [address], [place], [place] [address]    Culture   Final    NO GROWTH 2 DAYS Performed at [facility] [address], [place], [place] [address]    Report Status PENDING  Incomplete  Culture, blood (routine x 2)     Status: None (Preliminary result)   Collection Time: 12/28/18  2:05 AM   Specimen: BLOOD  Result Value Ref Range Status   Specimen Description   Final    BLOOD LEFT HAND Performed at [facility] [address], [place], [place] [address]    Special Requests   Final    BOTTLES DRAWN AEROBIC ONLY Blood Culture adequate volume Performed at [facility] [address] [place], [place] [address]    Culture   Final    NO GROWTH 2 DAYS Performed at [facility] [address], [place], [place] [address]    Report Status PENDING  Incomplete  SARS Coronavirus 2 [facility] order, Performed in [facility] hospital lab)     Status: None   Collection Time: 12/28/18 10:26 AM  Result Value Ref Range Status   SARS Coronavirus 2 NEGATIVE NEGATIVE Final    Comment: (NOTE) If result is NEGATIVE SARS-CoV-2 target nucleic acids are NOT DETECTED. The SARS-CoV-2 RNA is generally detectable in upper and lower  respiratory specimens during the acute phase of infection. The lowest  concentration of SARS-CoV-2 viral copies this assay can detect is 250  copies / mL. A negative result does not preclude SARS-CoV-2 infection  and should not be used as the sole basis for treatment or other  patient management decisions.  A negative result may occur with  improper specimen collection / handling, submission of specimen other  than nasopharyngeal swab, presence of viral mutation(s) within the  areas targeted by this assay, and inadequate number of viral copies  (<250 copies / mL). A negative result must be combined with clinical  observations, patient history, and epidemiological information. If result is POSITIVE SARS-CoV-2 target nucleic acids are DETECTED. The SARS-CoV-2 RNA is generally detectable in upper and lower  respiratory specimens dur ing the acute phase of infection.  Positive  results are indicative of active infection with SARS-CoV-2.  Clinical  correlation with patient history and other diagnostic information is  necessary to determine patient infection status.  Positive results do  not rule out bacterial infection or co-infection with other viruses. If result is PRESUMPTIVE POSTIVE SARS-CoV-2 nucleic acids MAY BE PRESENT.   A presumptive positive result was obtained on the submitted specimen  and confirmed on repeat testing.  While 2019 novel coronavirus  (SARS-CoV-2) nucleic acids may be present in the submitted sample  additional confirmatory testing may be  necessary for epidemiological  and / or clinical management purposes  to differentiate between  SARS-CoV-2 and other Sarbecovirus currently known to infect humans.  If clinically indicated additional testing with an alternate test  methodology [phone]) is advised. The SARS-CoV-2 RNA is generally  detectable in upper and lower respiratory sp ecimens during the acute  phase of infection. The expected result is Negative. Fact Sheet for Patients:  StrictlyIdeas.no Fact Sheet for Healthcare Providers: BankingDealers.co.za This test is not yet approved or cleared by the [place] FDA and has been authorized for detection and/or diagnosis of SARS-CoV-2 by FDA under an Emergency Use Authorization (EUA).  This EUA will remain in effect (meaning this test can be used) for the duration of the COVID-19 declaration under Section 564(b)(1) of the Act, 21 U.S.C. section 360bbb-3(b)(1), unless the authorization is terminated or revoked sooner. Performed at [facility], [place] [address] [place], [place] [address]     Radiology Reports Ct Abdomen Pelvis Wo Contrast  Result Date: 12/27/2018 CLINICAL DATA:  85 year old male with abdominal pain, nausea and vomiting. EXAM: CT ABDOMEN AND PELVIS WITHOUT CONTRAST TECHNIQUE: Multidetector CT imaging of the abdomen and pelvis was performed following the standard protocol without IV contrast. COMPARISON:  CT of the abdomen pelvis dated 02/15/2012 FINDINGS: Evaluation of this exam is limited in the absence of intravenous contrast. Lower chest: The visualized lung bases are clear. There is multi vessel coronary vascular calcification. No intra-abdominal free air or free fluid. Hepatobiliary: The liver is grossly unremarkable. There is moderate intrahepatic biliary ductal dilatation, new since the prior CT. There is a 13 mm stone in the central CBD. An additional 10 mm stone is noted in the region of  the ampulla. The common bile duct is dilated and measures up to 2 cm. There is layering sludge or stones within the gallbladder. No pericholecystic fluid. Further evaluation with MRI/MRCP is recommended. Pancreas: The pancreas is unremarkable. Spleen: Normal in size without focal abnormality. Adrenals/Urinary Tract: The adrenal glands are unremarkable. Moderate left and mild right renal parenchyma atrophy. Multiple small nonobstructing bilateral renal calculi measure up to 4 mm in the upper pole of the right kidney. There is no hydronephrosis on either side. Several right renal hypodense lesions with  the largest measuring up to 5 cm from the inferior pole of the right kidney. These lesions are suboptimally characterized on this noncontrast CT however demonstrate fluid attenuation, likely cysts. The visualized ureters and urinary bladder appear unremarkable. Stomach/Bowel: There is sigmoid diverticulosis without active inflammatory changes. There is no bowel obstruction or active inflammation. The appendix is normal. Vascular/Lymphatic: There is advanced aortoiliac atherosclerotic disease. Ectatic aorta measures up to 2.5 cm. Dilated common iliac arteries bilaterally measure up to 17 mm on the right. The IVC is unremarkable on this noncontrast CT. No portal venous gas. There is no adenopathy. Reproductive: Enlarged prostate gland measuring 5 cm in transverse axial diameter. The seminal vesicles are symmetric. Other: None Musculoskeletal: Degenerative changes of the spine. No acute osseous pathology. IMPRESSION: 1. Obstructing stones in the central CBD/ampulla region with moderate intrahepatic and extrahepatic biliary ductal dilatation, new since the prior CT. Further evaluation with MRI/MRCP is recommended. 2. Small nonobstructing bilateral renal calculi. No hydronephrosis. 3. Sigmoid diverticulosis. No bowel obstruction or active inflammation. Normal appendix. Aortic Atherosclerosis (ICD10-I70.0). Electronically  Signed   By: Anner Crete M.D.   On: 12/27/2018 19:34   Dg Ercp Biliary & Pancreatic Ducts  Result Date: 12/28/2018 CLINICAL DATA:  Intraop balloon occlusive retrograde cholangiogram with biliary sweeping and successful CBD stone removal. EXAM: ERCP TECHNIQUE: Multiple spot images obtained with the fluoroscopic device and submitted for interpretation post-procedure. COMPARISON:  CT abdomen pelvis-12/27/2018 FINDINGS: An ERCP probe overlies the right upper abdominal quadrant. There is selective cannulation opacification of the common bile duct which appears moderately dilated. There are 2 persistent filling defects within mid aspect of the CBD suggestive of the questioned choledocholithiasis on preceding abdominal CT scan. Subsequent image demonstrates insufflation of a balloon within the central aspect of the CBD with subsequent sweeping and biliary sphincterotomy. There is minimal opacification of the intrahepatic biliary tree which appears moderately dilated. There is no definitive opacification either the cystic or pancreatic ducts. IMPRESSION: ERCP with findings of choledocholithiasis with subsequent biliary sweeping and sphincterotomy. These images were submitted for radiologic interpretation only. Please see the procedural report for the amount of contrast and the fluoroscopy time utilized. Electronically Signed   By: Sandi Mariscal M.D.   On: 12/28/2018 13:10    Lab Data:  CBC: Recent Labs  Lab 12/27/18 1723 12/28/18 0205 12/29/18 0442 12/30/18 0358  WBC 15.4* 16.6* 13.0* 11.1*  NEUTROABS 12.8* 13.5*  --   --   HGB 13.2 12.4* 10.5* 10.1*  HCT 40.6 39.3 33.5* 31.8*  MCV 95.1 97.8 98.8 98.8  PLT 263 241 214 222   Basic Metabolic Panel: Recent Labs  Lab 12/27/18 1723 12/28/18 0205 12/29/18 0442 12/30/18 0358 12/30/18 1033  NA 135 140 137 141 142  K 4.6 5.1 5.1 4.8 4.6  CL 99 106 106 112* 112*  CO2 24 25 23 22 23   GLUCOSE 155* 121* 132* 107* 108*  BUN 36* 35* 37* 42* 42*    CREATININE 1.97* 1.97* 2.42* 2.57* 2.48*  CALCIUM 9.6 9.2 8.7* 8.2* 8.6*   GFR: Estimated Creatinine Clearance: 22.6 mL/min (A) (by C-G formula based on SCr of 2.48 mg/dL (H)). Liver Function Tests: Recent Labs  Lab 12/27/18 1723 12/28/18 0205 12/29/18 0442  AST 25 68* 42*  ALT 17 43 33  ALKPHOS 76 71 53  BILITOT 1.6* 2.2* 1.3*  PROT 7.7 7.0 6.3*  ALBUMIN 4.2 3.9 3.6   Recent Labs  Lab 12/27/18 1723  LIPASE 19   No results for input(s): AMMONIA in  the last 168 hours. Coagulation Profile: No results for input(s): INR, PROTIME in the last 168 hours. Cardiac Enzymes: No results for input(s): CKTOTAL, CKMB, CKMBINDEX, TROPONINI in the last 168 hours. BNP (last 3 results) No results for input(s): PROBNP in the last 8760 hours. HbA1C: No results for input(s): HGBA1C in the last 72 hours. CBG: No results for input(s): GLUCAP in the last 168 hours. Lipid Profile: No results for input(s): CHOL, HDL, LDLCALC, TRIG, CHOLHDL, LDLDIRECT in the last 72 hours. Thyroid Function Tests: Recent Labs    12/29/18 0442  TSH 0.595   Anemia Panel: No results for input(s): VITAMINB12, FOLATE, FERRITIN, TIBC, IRON, RETICCTPCT in the last 72 hours. Urine analysis:    Component Value Date/Time   COLORURINE YELLOW 12/27/2018 1953   APPEARANCEUR CLEAR 12/27/2018 1953   LABSPEC 1.025 12/27/2018 1953   PHURINE 6.0 12/27/2018 1953   GLUCOSEU NEGATIVE 12/27/2018 1953   GLUCOSEU NEGATIVE 08/29/2017 1601   HGBUR SMALL (A) 12/27/2018 1953   BILIRUBINUR NEGATIVE 12/27/2018 1953   KETONESUR NEGATIVE 12/27/2018 1953   PROTEINUR 100 (A) 12/27/2018 1953   UROBILINOGEN 0.2 08/29/2017 1601   NITRITE NEGATIVE 12/27/2018 1953   LEUKOCYTESUR NEGATIVE 12/27/2018 1953     Cordelia Poche M.D. [facility] 12/30/2018, 3:32 PM  Pager: [phone] Between 7am to 7pm - call Pager - [phone]  After 7pm go to [email or web address] - password [email or web address]  Call night coverage person covering after 7pm

## 2018-12-31 ENCOUNTER — Encounter (HOSPITAL_COMMUNITY): Payer: Self-pay | Admitting: Gastroenterology

## 2018-12-31 LAB — BASIC METABOLIC PANEL
Anion gap: 11 (ref 5–15)
BUN: 38 mg/dL — ABNORMAL HIGH (ref 8–23)
CO2: 19 mmol/L — ABNORMAL LOW (ref 22–32)
Calcium: 8.9 mg/dL (ref 8.9–10.3)
Chloride: 112 mmol/L — ABNORMAL HIGH (ref 98–111)
Creatinine, Ser: 2.43 mg/dL — ABNORMAL HIGH (ref 0.61–1.24)
GFR calc Af Amer: 27 mL/min — ABNORMAL LOW (ref >60)
GFR calc non Af Amer: 23 mL/min — ABNORMAL LOW (ref >60)
Glucose, Bld: 94 mg/dL (ref 70–99)
Potassium: 4.7 mmol/L (ref 3.5–5.1)
Sodium: 142 mmol/L (ref 135–145)

## 2018-12-31 LAB — CBC
HCT: 37.4 % — ABNORMAL LOW (ref 39.0–52.0)
Hemoglobin: 11.7 g/dL — ABNORMAL LOW (ref 13.0–17.0)
MCH: 31 pg (ref 26.0–34.0)
MCHC: 31.3 g/dL (ref 30.0–36.0)
MCV: 98.9 fL (ref 80.0–100.0)
Platelets: 262 10*3/uL (ref 150–400)
RBC: 3.78 MIL/uL — ABNORMAL LOW (ref 4.22–5.81)
RDW: 12.4 % (ref 11.5–15.5)
WBC: 11.7 10*3/uL — ABNORMAL HIGH (ref 4.0–10.5)
nRBC: 0 % (ref 0.0–0.2)

## 2018-12-31 NOTE — Progress Notes (Signed)
Pharmacy Antibiotic Note  Stephen Wilkins is a 85 y.o. male admitted on 12/27/2018 with Intra-abdominal infection.  Pharmacy has been consulted for zosyn dosing.  12/31/2018:  Day #4 abx   Afebrile  Scr improved slightly today- 2.43, est CrCl 54ml/min  Plan: Continue Zosyn 3.375g IV q8h (4 hour infusion).  Plan transition to Cipro at discharge for total of 5-7 days.  Recommend Cipro 500mg  po q24h for CrCl<79ml/min.     Height: 6' (182.9 cm) Weight: 195 lb (88.5 kg) IBW/kg (Calculated) : 77.6  Temp (24hrs), Avg:98.7 F (37.1 C), Min:98.4 F (36.9 C), Max:98.9 F (37.2 C)  Recent Labs  Lab 12/27/18 1723 12/28/18 0205 12/29/18 0442 12/30/18 0358 12/30/18 1033 12/31/18 0501  WBC 15.4* 16.6* 13.0* 11.1*  --  11.7*  CREATININE 1.97* 1.97* 2.42* 2.57* 2.48* 2.43*    Estimated Creatinine Clearance: 23.1 mL/min (A) (by C-G formula based on SCr of 2.43 mg/dL (H)).    Allergies  Allergen Reactions  . Atorvastatin     REACTION: myalgia  . Doxazosin Mesylate     REACTION: dizziness  . Hydrocodone-Homatropine Other (See Comments)    anxiety    Antimicrobials this admission: Zosyn 12/27/2018 >>   Dose adjustments this admission: -  Microbiology results: 9/25 BCx: NGTD  Thank you for allowing pharmacy to be a part of this patient's care.  Netta Cedars, PharmD, BCPS 12/31/2018 9:47 AM

## 2018-12-31 NOTE — Progress Notes (Signed)
PROGRESS NOTE    Stephen Wilkins  UVO:[id number] DOB: May 10, 1927 DOA: 12/27/2018 PCP: Biagio Borg, MD    Brief Narrative:  Patient is 85 year old male with history of hypertension, hypothyroidism, asthma COPD, and dementia presented with abdominal pain on and off for past few days, acutely worsened over last 2 days.  Patient also had nausea and vomiting.  CT abdomen pelvis showed obstructing stones in the common bile duct with intrahepatic and extrahepatic biliary duct dilation.  Admitted with GI consultation.   Assessment & Plan:   Principal Problem:   Choledocholithiasis Active Problems:   Essential hypertension   Dementia (HCC)   COPD (chronic obstructive pulmonary disease) (HCC)  Acute abdominal pain secondary to choledocholithiasis and cholangitis with cholecystitis: Treated with IV fluids, IV antibiotics with Zosyn Underwent ERCP on 9/25, multiple stones and sludge, status post biliary sphincterotomy and balloon extraction Clinically improving.  Advancing diet and tolerating. We will continue IV Zosyn today, will convert to oral fluoroquinolone on discharge. GI recommended that patient does not need cholecystectomy.  Acute kidney injury on chronic kidney disease stage III: Baseline creatinine 1.9. Still remains elevated at 2.4. No urinary obstruction. Continue maintenance IV fluids today, will recheck levels tomorrow morning to ensure stabilization.  Hypothyroidism: Stable on Synthroid.  BPH: On Flomax.  Continue.  Hypertension: Not on treatment.  Stable.   DVT prophylaxis: SCD Code Status: DNR Family Communication: Wife on phone  Disposition Plan: Home.  Anticipate tomorrow.   Consultants:   Gastroenterology  Procedures:   ERCP, 9/25  Antimicrobials:   Zosyn, 9/25 2020---   Subjective: Patient seen and examined.  He had some confusion and sundowning last night.  Now well composed.  Denies any complaints.  Denies any abdominal pain.  Was able to eat  soft diet.  Objective: Vitals:   12/30/18 1627 12/30/18 2032 12/31/18 0615 12/31/18 1241  BP: (!) 154/80 (!) 154/89 (!) 175/89 (!) 174/91  Pulse: 77 79 82 83  Resp: 18 16 16 18   Temp: 98.4 F (36.9 C) 98.7 F (37.1 C) 98.9 F (37.2 C) 98.6 F (37 C)  TempSrc: Oral Oral Oral Oral  SpO2: 94% 93% 91% 95%  Weight:      Height:        Intake/Output Summary (Last 24 hours) at 12/31/2018 1634 Last data filed at 12/31/2018 1347 Gross per 24 hour  Intake 413.64 ml  Output 4050 ml  Net -3636.36 ml   Filed Weights   12/27/18 1636  Weight: 88.5 kg    Examination:  General exam: Appears calm and comfortable, on room air. Respiratory system: Clear to auscultation. Respiratory effort normal. Cardiovascular system: S1 & S2 heard, RRR. No JVD, murmurs, rubs, gallops or clicks. No pedal edema. Gastrointestinal system: Abdomen is nondistended, soft and nontender. No organomegaly or masses felt. Normal bowel sounds heard. Central nervous system: Alert and oriented x2. No focal neurological deficits. Extremities: Symmetric 5 x 5 power. Skin: No rashes, lesions or ulcers Psychiatry: Judgement and insight appear impaired. Mood & affect appropriate.     Data Reviewed: I have personally reviewed following labs and imaging studies  CBC: Recent Labs  Lab 12/27/18 1723 12/28/18 0205 12/29/18 0442 12/30/18 0358 12/31/18 0501  WBC 15.4* 16.6* 13.0* 11.1* 11.7*  NEUTROABS 12.8* 13.5*  --   --   --   HGB 13.2 12.4* 10.5* 10.1* 11.7*  HCT 40.6 39.3 33.5* 31.8* 37.4*  MCV 95.1 97.8 98.8 98.8 98.9  PLT 263 241 214 217 742   Basic Metabolic  Panel: Recent Labs  Lab 12/28/18 0205 12/29/18 0442 12/30/18 0358 12/30/18 1033 12/31/18 0501  NA 140 137 141 142 142  K 5.1 5.1 4.8 4.6 4.7  CL 106 106 112* 112* 112*  CO2 25 23 22 23  19*  GLUCOSE 121* 132* 107* 108* 94  BUN 35* 37* 42* 42* 38*  CREATININE 1.97* 2.42* 2.57* 2.48* 2.43*  CALCIUM 9.2 8.7* 8.2* 8.6* 8.9   GFR: Estimated  Creatinine Clearance: 23.1 mL/min (A) (by C-G formula based on SCr of 2.43 mg/dL (H)). Liver Function Tests: Recent Labs  Lab 12/27/18 1723 12/28/18 0205 12/29/18 0442  AST 25 68* 42*  ALT 17 43 33  ALKPHOS 76 71 53  BILITOT 1.6* 2.2* 1.3*  PROT 7.7 7.0 6.3*  ALBUMIN 4.2 3.9 3.6   Recent Labs  Lab 12/27/18 1723  LIPASE 19   No results for input(s): AMMONIA in the last 168 hours. Coagulation Profile: No results for input(s): INR, PROTIME in the last 168 hours. Cardiac Enzymes: No results for input(s): CKTOTAL, CKMB, CKMBINDEX, TROPONINI in the last 168 hours. BNP (last 3 results) No results for input(s): PROBNP in the last 8760 hours. HbA1C: No results for input(s): HGBA1C in the last 72 hours. CBG: No results for input(s): GLUCAP in the last 168 hours. Lipid Profile: No results for input(s): CHOL, HDL, LDLCALC, TRIG, CHOLHDL, LDLDIRECT in the last 72 hours. Thyroid Function Tests: Recent Labs    12/29/18 0442  TSH 0.595   Anemia Panel: No results for input(s): VITAMINB12, FOLATE, FERRITIN, TIBC, IRON, RETICCTPCT in the last 72 hours. Sepsis Labs: No results for input(s): PROCALCITON, LATICACIDVEN in the last 168 hours.  Recent Results (from the past 240 hour(s))  Culture, blood (routine x 2)     Status: None (Preliminary result)   Collection Time: 12/28/18  2:05 AM   Specimen: BLOOD  Result Value Ref Range Status   Specimen Description   Final    BLOOD RIGHT HAND Performed at [facility] [address] [place], [place] [address]    Special Requests   Final    BOTTLES DRAWN AEROBIC AND ANAEROBIC Blood Culture adequate volume Performed at [facility] [address], [place], [place] [address]    Culture   Final    NO GROWTH 3 DAYS Performed at [facility], [place] [address], [place], [place] [address]    Report Status PENDING  Incomplete  Culture, blood (routine x 2)     Status: None (Preliminary result)    Collection Time: 12/28/18  2:05 AM   Specimen: BLOOD  Result Value Ref Range Status   Specimen Description   Final    BLOOD LEFT HAND Performed at [facility] [address], [place], [place] [address]    Special Requests   Final    BOTTLES DRAWN AEROBIC ONLY Blood Culture adequate volume Performed at [facility] [address], [place], [place] [address]    Culture   Final    NO GROWTH 3 DAYS Performed at [facility], [place] [address], [place], [place] [address]    Report Status PENDING  Incomplete  SARS Coronavirus 2 [facility] order, Performed in [facility] hospital lab)     Status: None   Collection Time: 12/28/18 10:26 AM  Result Value Ref Range Status   SARS Coronavirus 2 NEGATIVE NEGATIVE Final    Comment: (NOTE) If result is NEGATIVE SARS-CoV-2 target nucleic acids are NOT DETECTED. The SARS-CoV-2  RNA is generally detectable in upper and lower  respiratory specimens during the acute phase of infection. The lowest  concentration of SARS-CoV-2 viral copies this assay can detect is 250  copies / mL. A negative result does not preclude SARS-CoV-2 infection  and should not be used as the sole basis for treatment or other  patient management decisions.  A negative result may occur with  improper specimen collection / handling, submission of specimen other  than nasopharyngeal swab, presence of viral mutation(s) within the  areas targeted by this assay, and inadequate number of viral copies  (<250 copies / mL). A negative result must be combined with clinical  observations, patient history, and epidemiological information. If result is POSITIVE SARS-CoV-2 target nucleic acids are DETECTED. The SARS-CoV-2 RNA is generally detectable in upper and lower  respiratory specimens dur ing the acute phase of infection.  Positive  results are indicative of active infection with SARS-CoV-2.  Clinical  correlation with patient history and  other diagnostic information is  necessary to determine patient infection status.  Positive results do  not rule out bacterial infection or co-infection with other viruses. If result is PRESUMPTIVE POSTIVE SARS-CoV-2 nucleic acids MAY BE PRESENT.   A presumptive positive result was obtained on the submitted specimen  and confirmed on repeat testing.  While 2019 novel coronavirus  (SARS-CoV-2) nucleic acids may be present in the submitted sample  additional confirmatory testing may be necessary for epidemiological  and / or clinical management purposes  to differentiate between  SARS-CoV-2 and other Sarbecovirus currently known to infect humans.  If clinically indicated additional testing with an alternate test  methodology [phone]) is advised. The SARS-CoV-2 RNA is generally  detectable in upper and lower respiratory sp ecimens during the acute  phase of infection. The expected result is Negative. Fact Sheet for Patients:  StrictlyIdeas.no Fact Sheet for Healthcare Providers: BankingDealers.co.za This test is not yet approved or cleared by the [place] FDA and has been authorized for detection and/or diagnosis of SARS-CoV-2 by FDA under an Emergency Use Authorization (EUA).  This EUA will remain in effect (meaning this test can be used) for the duration of the COVID-19 declaration under Section 564(b)(1) of the Act, 21 U.S.C. section 360bbb-3(b)(1), unless the authorization is terminated or revoked sooner. Performed at [facility], [place] [address], [place], [place] [address]          Radiology Studies: Dg Chest Port 1 View  Result Date: 12/30/2018 CLINICAL DATA:  Shortness of breath, history of acute respiratory failure EXAM: PORTABLE CHEST 1 VIEW COMPARISON:  Most recent radiograph 06/06/2017. FINDINGS: Coalescent opacity is present in the right lung base and infrahilar right lung with few air bronchograms.  Background of more hazy basilar opacities likely reflecting atelectasis in the setting of diminished lung volumes. No pneumothorax. No visible effusion. Cardiomediastinal contours are similar to prior including a calcified aorta. Degenerative changes are present in the imaged spine and shoulders. No acute osseous or soft tissue abnormality. IMPRESSION: Coalescent opacity in the right lung base and infrahilar right lung, suspicious for pneumonia. Electronically Signed   By: Lovena Le M.D.   On: 12/30/2018 20:21        Scheduled Meds: . levothyroxine  112 mcg Oral Daily  . tamsulosin  0.4 mg Oral QHS   Continuous Infusions: . sodium chloride 50 mL/hr at 12/31/18 1020  . piperacillin-tazobactam (ZOSYN)  IV 3.375 g (12/31/18 1345)     LOS: 4 days  Time spent: 25 minutes    Barb Merino, MD [facility] Pager [phone]  If 7PM-7AM, please contact night-coverage [email or web address] Password [facility] 12/31/2018, 4:34 PM

## 2019-01-01 LAB — CBC WITH DIFFERENTIAL/PLATELET
Abs Immature Granulocytes: 0.05 10*3/uL (ref 0.00–0.07)
Basophils Absolute: 0.1 10*3/uL (ref 0.0–0.1)
Basophils Relative: 1 %
Eosinophils Absolute: 0.9 10*3/uL — ABNORMAL HIGH (ref 0.0–0.5)
Eosinophils Relative: 9 %
HCT: 33.8 % — ABNORMAL LOW (ref 39.0–52.0)
Hemoglobin: 10.7 g/dL — ABNORMAL LOW (ref 13.0–17.0)
Immature Granulocytes: 1 %
Lymphocytes Relative: 18 %
Lymphs Abs: 1.9 10*3/uL (ref 0.7–4.0)
MCH: 31.2 pg (ref 26.0–34.0)
MCHC: 31.7 g/dL (ref 30.0–36.0)
MCV: 98.5 fL (ref 80.0–100.0)
Monocytes Absolute: 0.9 10*3/uL (ref 0.1–1.0)
Monocytes Relative: 8 %
Neutro Abs: 6.5 10*3/uL (ref 1.7–7.7)
Neutrophils Relative %: 63 %
Platelets: 245 10*3/uL (ref 150–400)
RBC: 3.43 MIL/uL — ABNORMAL LOW (ref 4.22–5.81)
RDW: 12.3 % (ref 11.5–15.5)
WBC: 10.3 10*3/uL (ref 4.0–10.5)
nRBC: 0 % (ref 0.0–0.2)

## 2019-01-01 LAB — BASIC METABOLIC PANEL
Anion gap: 8 (ref 5–15)
BUN: 31 mg/dL — ABNORMAL HIGH (ref 8–23)
CO2: 22 mmol/L (ref 22–32)
Calcium: 8.8 mg/dL — ABNORMAL LOW (ref 8.9–10.3)
Chloride: 110 mmol/L (ref 98–111)
Creatinine, Ser: 2.13 mg/dL — ABNORMAL HIGH (ref 0.61–1.24)
GFR calc Af Amer: 31 mL/min — ABNORMAL LOW (ref >60)
GFR calc non Af Amer: 27 mL/min — ABNORMAL LOW (ref >60)
Glucose, Bld: 102 mg/dL — ABNORMAL HIGH (ref 70–99)
Potassium: 4.4 mmol/L (ref 3.5–5.1)
Sodium: 140 mmol/L (ref 135–145)

## 2019-01-01 NOTE — Plan of Care (Signed)
Discharge instructions reviewed with patient and his wife, questions answered, verbalized understanding.  Patient transported via wheelchair to main entrance to be taken home by wife.

## 2019-01-01 NOTE — Discharge Summary (Signed)
Physician Discharge Summary  Stephen Wilkins TUU:[id number] DOB: 22-Feb-1928 DOA: 12/27/2018  PCP: Biagio Borg, MD  Admit date: 12/27/2018 Discharge date: 01/01/2019  Admitted From: Home. Disposition: Home  Recommendations for Outpatient Follow-up:  1. Follow up with PCP in 1-2 weeks 2. Please obtain BMP/CBC in one week   Home Health: Not applicable Equipment/Devices: Not applicable  Discharge Condition: Stable CODE STATUS: DNR Diet recommendation: Low-salt, low-fat diet  Discharge summary: Patient is 85 year old male with history of hypertension, hypothyroidism, asthma COPD, and dementia presented with abdominal pain on and off for past few days, acutely worsened over last 2 days.  Patient also had nausea and vomiting.  CT abdomen pelvis showed obstructing stones in the common bile duct with intrahepatic and extrahepatic biliary duct dilation.  Patient was admitted to hospital with GI consultation.  Acute abdominal pain secondary to choledocholithiasis and cholangitis with cholecystitis: Patient was treated with IV fluids, IV antibiotics with Zosyn, underwent ERCP on 12/28/2018 and found to have multiple stones and sludge, underwent biliary sphincterectomy and balloon extraction of the stones.  Did very good clinical recovery.  Currently with normal bowel functions.  Afebrile.  LFTs normalized.  GI recommended no need for cholecystectomy.  Had some acute kidney injury after procedure, treated with IV fluids and improved to around his baseline. Patient will finish 5 days of IV Zosyn today, blood cultures negative.  He will be finishing total antibiotic therapy today in the hospital before discharge. Resume his blood pressure medications at home.  Other chronic medical issues remained stable and he will be resuming his medications.  Discharge Diagnoses:  Principal Problem:   Choledocholithiasis Active Problems:   Essential hypertension   Dementia (HCC)   COPD (chronic obstructive  pulmonary disease) ([place])    Discharge Instructions  Discharge Instructions    Call MD for:  persistant nausea and vomiting   Complete by: As directed    Call MD for:  severe uncontrolled pain   Complete by: As directed    Diet - low sodium heart healthy   Complete by: As directed    Increase activity slowly   Complete by: As directed      Allergies as of 01/01/2019      Reactions   Atorvastatin    REACTION: myalgia   Doxazosin Mesylate    REACTION: dizziness   Hydrocodone-homatropine Other (See Comments)   anxiety      Medication List    STOP taking these medications   ALPRAZolam 0.5 MG tablet Commonly known as: Xanax   Azelastine HCl 0.15 % Soln   budesonide-formoterol 80-4.5 MCG/ACT inhaler Commonly known as: Symbicort   cetirizine 10 MG tablet Commonly known as: ZYRTEC   meclizine 12.5 MG tablet Commonly known as: ANTIVERT   montelukast 10 MG tablet Commonly known as: Singulair   predniSONE 10 MG tablet Commonly known as: DELTASONE   rosuvastatin 10 MG tablet Commonly known as: CRESTOR   tiZANidine 2 MG tablet Commonly known as: ZANAFLEX   traMADol 50 MG tablet Commonly known as: ULTRAM     TAKE these medications   albuterol 108 (90 Base) MCG/ACT inhaler Commonly known as: VENTOLIN HFA Inhale 2 puffs into the lungs every 6 (six) hours as needed for wheezing.   aspirin EC 81 MG tablet Take 1 tablet (81 mg total) by mouth daily. What changed: when to take this   CENTRUM SILVER PO Take 1 tablet by mouth daily.   desoximetasone 0.25 % cream Commonly known as: TOPICORT APPLY SPARINGLY TO  THE AFFECTED AREA TWICE DAILY AS NEEDED AS DIRECTED What changed:   how much to take  how to take this  when to take this  additional instructions   lansoprazole 30 MG capsule Commonly known as: Prevacid Take 1 capsule (30 mg total) by mouth daily at 12 noon. What changed:   when to take this  reasons to take this   levothyroxine 112 MCG  tablet Commonly known as: SYNTHROID Take 1 tablet (112 mcg total) by mouth daily.   losartan 100 MG tablet Commonly known as: COZAAR Take 1 tablet (100 mg total) by mouth daily.   polyethylene glycol powder 17 GM/SCOOP powder Commonly known as: MiraLax Take 1/2 capful by mouth once daily What changed:   how much to take  how to take this  when to take this  additional instructions   tamsulosin 0.4 MG Caps capsule Commonly known as: FLOMAX Take 1 capsule (0.4 mg total) by mouth at bedtime.   vitamin B-12 100 MCG tablet Commonly known as: CYANOCOBALAMIN Take 100 mcg by mouth once a week.   vitamin C 500 MG tablet Commonly known as: ASCORBIC ACID Take 1,000 mg by mouth daily.   Vitamin D 1000 units capsule Take 1,000 Units by mouth every other day.       Allergies  Allergen Reactions  . Atorvastatin     REACTION: myalgia  . Doxazosin Mesylate     REACTION: dizziness  . Hydrocodone-Homatropine Other (See Comments)    anxiety    Consultations:  Gastroenterology   Procedures/Studies: Ct Abdomen Pelvis Wo Contrast  Result Date: 12/27/2018 CLINICAL DATA:  85 year old male with abdominal pain, nausea and vomiting. EXAM: CT ABDOMEN AND PELVIS WITHOUT CONTRAST TECHNIQUE: Multidetector CT imaging of the abdomen and pelvis was performed following the standard protocol without IV contrast. COMPARISON:  CT of the abdomen pelvis dated 02/15/2012 FINDINGS: Evaluation of this exam is limited in the absence of intravenous contrast. Lower chest: The visualized lung bases are clear. There is multi vessel coronary vascular calcification. No intra-abdominal free air or free fluid. Hepatobiliary: The liver is grossly unremarkable. There is moderate intrahepatic biliary ductal dilatation, new since the prior CT. There is a 13 mm stone in the central CBD. An additional 10 mm stone is noted in the region of the ampulla. The common bile duct is dilated and measures up to 2 cm. There is  layering sludge or stones within the gallbladder. No pericholecystic fluid. Further evaluation with MRI/MRCP is recommended. Pancreas: The pancreas is unremarkable. Spleen: Normal in size without focal abnormality. Adrenals/Urinary Tract: The adrenal glands are unremarkable. Moderate left and mild right renal parenchyma atrophy. Multiple small nonobstructing bilateral renal calculi measure up to 4 mm in the upper pole of the right kidney. There is no hydronephrosis on either side. Several right renal hypodense lesions with the largest measuring up to 5 cm from the inferior pole of the right kidney. These lesions are suboptimally characterized on this noncontrast CT however demonstrate fluid attenuation, likely cysts. The visualized ureters and urinary bladder appear unremarkable. Stomach/Bowel: There is sigmoid diverticulosis without active inflammatory changes. There is no bowel obstruction or active inflammation. The appendix is normal. Vascular/Lymphatic: There is advanced aortoiliac atherosclerotic disease. Ectatic aorta measures up to 2.5 cm. Dilated common iliac arteries bilaterally measure up to 17 mm on the right. The IVC is unremarkable on this noncontrast CT. No portal venous gas. There is no adenopathy. Reproductive: Enlarged prostate gland measuring 5 cm in transverse axial diameter. The seminal  vesicles are symmetric. Other: None Musculoskeletal: Degenerative changes of the spine. No acute osseous pathology. IMPRESSION: 1. Obstructing stones in the central CBD/ampulla region with moderate intrahepatic and extrahepatic biliary ductal dilatation, new since the prior CT. Further evaluation with MRI/MRCP is recommended. 2. Small nonobstructing bilateral renal calculi. No hydronephrosis. 3. Sigmoid diverticulosis. No bowel obstruction or active inflammation. Normal appendix. Aortic Atherosclerosis (ICD10-I70.0). Electronically Signed   By: Anner Crete M.D.   On: 12/27/2018 19:34   Dg Chest Port 1  View  Result Date: 12/30/2018 CLINICAL DATA:  Shortness of breath, history of acute respiratory failure EXAM: PORTABLE CHEST 1 VIEW COMPARISON:  Most recent radiograph 06/06/2017. FINDINGS: Coalescent opacity is present in the right lung base and infrahilar right lung with few air bronchograms. Background of more hazy basilar opacities likely reflecting atelectasis in the setting of diminished lung volumes. No pneumothorax. No visible effusion. Cardiomediastinal contours are similar to prior including a calcified aorta. Degenerative changes are present in the imaged spine and shoulders. No acute osseous or soft tissue abnormality. IMPRESSION: Coalescent opacity in the right lung base and infrahilar right lung, suspicious for pneumonia. Electronically Signed   By: Lovena Le M.D.   On: 12/30/2018 20:21   Dg Ercp Biliary & Pancreatic Ducts  Result Date: 12/28/2018 CLINICAL DATA:  Intraop balloon occlusive retrograde cholangiogram with biliary sweeping and successful CBD stone removal. EXAM: ERCP TECHNIQUE: Multiple spot images obtained with the fluoroscopic device and submitted for interpretation post-procedure. COMPARISON:  CT abdomen pelvis-12/27/2018 FINDINGS: An ERCP probe overlies the right upper abdominal quadrant. There is selective cannulation opacification of the common bile duct which appears moderately dilated. There are 2 persistent filling defects within mid aspect of the CBD suggestive of the questioned choledocholithiasis on preceding abdominal CT scan. Subsequent image demonstrates insufflation of a balloon within the central aspect of the CBD with subsequent sweeping and biliary sphincterotomy. There is minimal opacification of the intrahepatic biliary tree which appears moderately dilated. There is no definitive opacification either the cystic or pancreatic ducts. IMPRESSION: ERCP with findings of choledocholithiasis with subsequent biliary sweeping and sphincterotomy. These images were  submitted for radiologic interpretation only. Please see the procedural report for the amount of contrast and the fluoroscopy time utilized. Electronically Signed   By: Sandi Mariscal M.D.   On: 12/28/2018 13:10      Subjective: Patient was seen and examined.  No overnight events.  Denies any nausea vomiting.  Less confused than before.  Eating well.   Discharge Exam: Vitals:   12/31/18 2257 01/01/19 0521  BP: (!) 167/75 (!) 180/94  Pulse: 84 80  Resp: (!) 22 (!) 22  Temp: 98.9 F (37.2 C) 97.9 F (36.6 C)  SpO2: 91% 95%   Vitals:   12/31/18 0615 12/31/18 1241 12/31/18 2257 01/01/19 0521  BP: (!) 175/89 (!) 174/91 (!) 167/75 (!) 180/94  Pulse: 82 83 84 80  Resp: 16 18 (!) 22 (!) 22  Temp: 98.9 F (37.2 C) 98.6 F (37 C) 98.9 F (37.2 C) 97.9 F (36.6 C)  TempSrc: Oral Oral  Oral  SpO2: 91% 95% 91% 95%  Weight:      Height:        General: Pt is alert, awake, not in acute distress Cardiovascular: RRR, S1/S2 +, no rubs, no gallops Respiratory: CTA bilaterally, no wheezing, no rhonchi Abdominal: Soft, NT, ND, bowel sounds + Extremities: no edema, no cyanosis    The results of significant diagnostics from this hospitalization (including imaging, microbiology, ancillary and  laboratory) are listed below for reference.     Microbiology: Recent Results (from the past 240 hour(s))  Culture, blood (routine x 2)     Status: None (Preliminary result)   Collection Time: 12/28/18  2:05 AM   Specimen: BLOOD  Result Value Ref Range Status   Specimen Description   Final    BLOOD RIGHT HAND Performed at [facility] [address] [place], [place] [address]    Special Requests   Final    BOTTLES DRAWN AEROBIC AND ANAEROBIC Blood Culture adequate volume Performed at [facility] [address], [place], [place] [address]    Culture   Final    NO GROWTH 4 DAYS Performed at [facility], [place] [address], [place], [place] [address]     Report Status PENDING  Incomplete  Culture, blood (routine x 2)     Status: None (Preliminary result)   Collection Time: 12/28/18  2:05 AM   Specimen: BLOOD  Result Value Ref Range Status   Specimen Description   Final    BLOOD LEFT HAND Performed at [facility] [address] [place], [place] [address]    Special Requests   Final    BOTTLES DRAWN AEROBIC ONLY Blood Culture adequate volume Performed at [facility] [address] [place], [place] [address]    Culture   Final    NO GROWTH 4 DAYS Performed at [facility], [place] [address] [place],  [address]    Report Status PENDING  Incomplete  SARS Coronavirus 2 [facility] order, Performed in [place] hospital lab)     Status: None   Collection Time: 12/28/18 10:26 AM  Result Value Ref Range Status   SARS Coronavirus 2 NEGATIVE NEGATIVE Final    Comment: (NOTE) If result is NEGATIVE SARS-CoV-2 target nucleic acids are NOT DETECTED. The SARS-CoV-2 RNA is generally detectable in upper and lower  respiratory specimens during the acute phase of infection. The lowest  concentration of SARS-CoV-2 viral copies this assay can detect is 250  copies / mL. A negative result does not preclude SARS-CoV-2 infection  and should not be used as the sole basis for treatment or other  patient management decisions.  A negative result may occur with  improper specimen collection / handling, submission of specimen other  than nasopharyngeal swab, presence of viral mutation(s) within the  areas targeted by this assay, and inadequate number of viral copies  (<250 copies / mL). A negative result must be combined with clinical  observations, patient history, and epidemiological information. If result is POSITIVE SARS-CoV-2 target nucleic acids are DETECTED. The SARS-CoV-2 RNA is generally detectable in upper and lower  respiratory specimens dur ing the acute phase of infection.  Positive   results are indicative of active infection with SARS-CoV-2.  Clinical  correlation with patient history and other diagnostic information is  necessary to determine patient infection status.  Positive results do  not rule out bacterial infection or co-infection with other viruses. If result is PRESUMPTIVE POSTIVE SARS-CoV-2 nucleic acids MAY BE PRESENT.   A presumptive positive result was obtained on the submitted specimen  and confirmed on repeat testing.  While 2019 novel coronavirus  (SARS-CoV-2) nucleic acids may be present in the submitted sample  additional confirmatory testing may be necessary for epidemiological  and / or clinical management purposes  to differentiate between  SARS-CoV-2 and other Sarbecovirus currently known to infect humans.  If  clinically indicated additional testing with an alternate test  methodology [phone]) is advised. The SARS-CoV-2 RNA is generally  detectable in upper and lower respiratory sp ecimens during the acute  phase of infection. The expected result is Negative. Fact Sheet for Patients:  StrictlyIdeas.no Fact Sheet for Healthcare Providers: BankingDealers.co.za This test is not yet approved or cleared by the [place] FDA and has been authorized for detection and/or diagnosis of SARS-CoV-2 by FDA under an Emergency Use Authorization (EUA).  This EUA will remain in effect (meaning this test can be used) for the duration of the COVID-19 declaration under Section 564(b)(1) of the Act, 21 U.S.C. section 360bbb-3(b)(1), unless the authorization is terminated or revoked sooner. Performed at [facility], [place] [address] [place], [place] [address]      Labs: BNP (last 3 results) No results for input(s): BNP in the last 8760 hours. Basic Metabolic Panel: Recent Labs  Lab 12/29/18 0442 12/30/18 0358 12/30/18 1033 12/31/18 0501 01/01/19 0414  NA 137 141 142 142 140  K  5.1 4.8 4.6 4.7 4.4  CL 106 112* 112* 112* 110  CO2 23 22 23  19* 22  GLUCOSE 132* 107* 108* 94 102*  BUN 37* 42* 42* 38* 31*  CREATININE 2.42* 2.57* 2.48* 2.43* 2.13*  CALCIUM 8.7* 8.2* 8.6* 8.9 8.8*   Liver Function Tests: Recent Labs  Lab 12/27/18 1723 12/28/18 0205 12/29/18 0442  AST 25 68* 42*  ALT 17 43 33  ALKPHOS 76 71 53  BILITOT 1.6* 2.2* 1.3*  PROT 7.7 7.0 6.3*  ALBUMIN 4.2 3.9 3.6   Recent Labs  Lab 12/27/18 1723  LIPASE 19   No results for input(s): AMMONIA in the last 168 hours. CBC: Recent Labs  Lab 12/27/18 1723 12/28/18 0205 12/29/18 0442 12/30/18 0358 12/31/18 0501 01/01/19 0414  WBC 15.4* 16.6* 13.0* 11.1* 11.7* 10.3  NEUTROABS 12.8* 13.5*  --   --   --  6.5  HGB 13.2 12.4* 10.5* 10.1* 11.7* 10.7*  HCT 40.6 39.3 33.5* 31.8* 37.4* 33.8*  MCV 95.1 97.8 98.8 98.8 98.9 98.5  PLT 263 241 214 217 262 245   Cardiac Enzymes: No results for input(s): CKTOTAL, CKMB, CKMBINDEX, TROPONINI in the last 168 hours. BNP: Invalid input(s): POCBNP CBG: No results for input(s): GLUCAP in the last 168 hours. D-Dimer No results for input(s): DDIMER in the last 72 hours. Hgb A1c No results for input(s): HGBA1C in the last 72 hours. Lipid Profile No results for input(s): CHOL, HDL, LDLCALC, TRIG, CHOLHDL, LDLDIRECT in the last 72 hours. Thyroid function studies No results for input(s): TSH, T4TOTAL, T3FREE, THYROIDAB in the last 72 hours.  Invalid input(s): FREET3 Anemia work up No results for input(s): VITAMINB12, FOLATE, FERRITIN, TIBC, IRON, RETICCTPCT in the last 72 hours. Urinalysis    Component Value Date/Time   COLORURINE YELLOW 12/27/2018 1953   APPEARANCEUR CLEAR 12/27/2018 1953   LABSPEC 1.025 12/27/2018 1953   PHURINE 6.0 12/27/2018 1953   GLUCOSEU NEGATIVE 12/27/2018 1953   GLUCOSEU NEGATIVE 08/29/2017 1601   HGBUR SMALL (A) 12/27/2018 1953   BILIRUBINUR NEGATIVE 12/27/2018 1953   KETONESUR NEGATIVE 12/27/2018 1953   PROTEINUR 100 (A)  12/27/2018 1953   UROBILINOGEN 0.2 08/29/2017 1601   NITRITE NEGATIVE 12/27/2018 1953   LEUKOCYTESUR NEGATIVE 12/27/2018 1953   Sepsis Labs Invalid input(s): PROCALCITONIN,  WBC,  LACTICIDVEN Microbiology Recent Results (from the past 240 hour(s))  Culture, blood (routine x 2)     Status: None (Preliminary result)   Collection  Time: 12/28/18  2:05 AM   Specimen: BLOOD  Result Value Ref Range Status   Specimen Description   Final    BLOOD RIGHT HAND Performed at [facility] [address], [place], [place] [address]    Special Requests   Final    BOTTLES DRAWN AEROBIC AND ANAEROBIC Blood Culture adequate volume Performed at [facility] [address] [place], [place] [address]    Culture   Final    NO GROWTH 4 DAYS Performed at [facility], [place] [address], [place], [place] [address]    Report Status PENDING  Incomplete  Culture, blood (routine x 2)     Status: None (Preliminary result)   Collection Time: 12/28/18  2:05 AM   Specimen: BLOOD  Result Value Ref Range Status   Specimen Description   Final    BLOOD LEFT HAND Performed at [facility] [address], [place], [place] [address]    Special Requests   Final    BOTTLES DRAWN AEROBIC ONLY Blood Culture adequate volume Performed at [facility] [address] [place], [place] [address]    Culture   Final    NO GROWTH 4 DAYS Performed at [facility], [place] [address], [place], [place] [address]    Report Status PENDING  Incomplete  SARS Coronavirus 2 [facility] order, Performed in [facility] [place] [facility] hospital lab)     Status: None   Collection Time: 12/28/18 10:26 AM  Result Value Ref Range Status   SARS Coronavirus 2 NEGATIVE NEGATIVE Final    Comment: (NOTE) If result is NEGATIVE SARS-CoV-2 target nucleic acids are NOT DETECTED. The SARS-CoV-2 RNA is generally detectable in upper and lower  respiratory specimens during the  acute phase of infection. The lowest  concentration of SARS-CoV-2 viral copies this assay can detect is 250  copies / mL. A negative result does not preclude SARS-CoV-2 infection  and should not be used as the sole basis for treatment or other  patient management decisions.  A negative result may occur with  improper specimen collection / handling, submission of specimen other  than nasopharyngeal swab, presence of viral mutation(s) within the  areas targeted by this assay, and inadequate number of viral copies  (<250 copies / mL). A negative result must be combined with clinical  observations, patient history, and epidemiological information. If result is POSITIVE SARS-CoV-2 target nucleic acids are DETECTED. The SARS-CoV-2 RNA is generally detectable in upper and lower  respiratory specimens dur ing the acute phase of infection.  Positive  results are indicative of active infection with SARS-CoV-2.  Clinical  correlation with patient history and other diagnostic information is  necessary to determine patient infection status.  Positive results do  not rule out bacterial infection or co-infection with other viruses. If result is PRESUMPTIVE POSTIVE SARS-CoV-2 nucleic acids MAY BE PRESENT.   A presumptive positive result was obtained on the submitted specimen  and confirmed on repeat testing.  While 2019 novel coronavirus  (SARS-CoV-2) nucleic acids may be present in the submitted sample  additional confirmatory testing may be necessary for epidemiological  and / or clinical management purposes  to differentiate between  SARS-CoV-2 and other Sarbecovirus currently known to infect humans.  If clinically indicated additional testing with an alternate test  methodology [phone]) is advised. The SARS-CoV-2 RNA is generally  detectable in upper and lower respiratory sp ecimens during the acute  phase of infection. The expected  result is Negative. Fact Sheet for Patients:   StrictlyIdeas.no Fact Sheet for Healthcare Providers: BankingDealers.co.za This test is not yet approved or cleared by the [place] FDA and has been authorized for detection and/or diagnosis of SARS-CoV-2 by FDA under an Emergency Use Authorization (EUA).  This EUA will remain in effect (meaning this test can be used) for the duration of the COVID-19 declaration under Section 564(b)(1) of the Act, 21 U.S.C. section 360bbb-3(b)(1), unless the authorization is terminated or revoked sooner. Performed at [facility], [place] [address] [place], [place] [address]      Time coordinating discharge:  35 minutes  SIGNED:   Barb Merino, MD  [facility] 01/01/2019, 12:07 PM

## 2019-01-02 ENCOUNTER — Telehealth: Payer: Self-pay | Admitting: *Deleted

## 2019-01-02 LAB — CULTURE, BLOOD (ROUTINE X 2)
Culture: NO GROWTH
Culture: NO GROWTH
Special Requests: ADEQUATE
Special Requests: ADEQUATE

## 2019-01-02 NOTE — Telephone Encounter (Signed)
Transition Care Management Follow-up Telephone Call   Date discharged? 01/01/19   How have you been since you were released from the hospital? Spoke w/wife she states husband is doing just fine. He is not confuse anymore and is up walking    Do you understand why you were in the hospital? YES   Do you understand the discharge instructions? YES   Where were you discharged to? Home   Items Reviewed:  Medications reviewed: YES  Allergies reviewed: YES  Dietary changes reviewed: YES  Referrals reviewed: No referral recommeded   Functional Questionnaire:   Activities of Daily Living (ADLs):   She states he are independent in the following: ambulation, bathing and hygiene, feeding, continence, grooming, toileting and dressing States he require assistance with the following: ambulation at times to get his balance   Any transportation issues/concerns?: NO   Any patient concerns? NO   Confirmed importance and date/time of follow-up visits scheduled YES, appt 01/09/19 Provider Appointment booked with  Dr. Jenny Reichmann   Confirmed with patient if condition begins to worsen call PCP or go to the ER.  Patient was given the office number and encouraged to call back with question or concerns.  : YES

## 2019-01-09 ENCOUNTER — Ambulatory Visit (INDEPENDENT_AMBULATORY_CARE_PROVIDER_SITE_OTHER): Payer: PPO | Admitting: Internal Medicine

## 2019-01-09 ENCOUNTER — Other Ambulatory Visit: Payer: Self-pay

## 2019-01-09 ENCOUNTER — Other Ambulatory Visit (INDEPENDENT_AMBULATORY_CARE_PROVIDER_SITE_OTHER): Payer: PPO

## 2019-01-09 ENCOUNTER — Encounter: Payer: Self-pay | Admitting: Internal Medicine

## 2019-01-09 VITALS — BP 128/84 | HR 83 | Temp 98.0°F | Ht 72.0 in | Wt 199.0 lb

## 2019-01-09 DIAGNOSIS — Z23 Encounter for immunization: Secondary | ICD-10-CM | POA: Diagnosis not present

## 2019-01-09 DIAGNOSIS — E611 Iron deficiency: Secondary | ICD-10-CM | POA: Diagnosis not present

## 2019-01-09 DIAGNOSIS — K8050 Calculus of bile duct without cholangitis or cholecystitis without obstruction: Secondary | ICD-10-CM | POA: Diagnosis not present

## 2019-01-09 DIAGNOSIS — E538 Deficiency of other specified B group vitamins: Secondary | ICD-10-CM

## 2019-01-09 DIAGNOSIS — E559 Vitamin D deficiency, unspecified: Secondary | ICD-10-CM | POA: Diagnosis not present

## 2019-01-09 DIAGNOSIS — E890 Postprocedural hypothyroidism: Secondary | ICD-10-CM | POA: Diagnosis not present

## 2019-01-09 DIAGNOSIS — Z0000 Encounter for general adult medical examination without abnormal findings: Secondary | ICD-10-CM

## 2019-01-09 DIAGNOSIS — J411 Mucopurulent chronic bronchitis: Secondary | ICD-10-CM | POA: Diagnosis not present

## 2019-01-09 LAB — BASIC METABOLIC PANEL
BUN: 31 mg/dL — ABNORMAL HIGH (ref 6–23)
CO2: 30 mEq/L (ref 19–32)
Calcium: 9.7 mg/dL (ref 8.4–10.5)
Chloride: 105 mEq/L (ref 96–112)
Creatinine, Ser: 2.32 mg/dL — ABNORMAL HIGH (ref 0.40–1.50)
GFR: 26.65 mL/min — ABNORMAL LOW (ref >60.00)
Glucose, Bld: 114 mg/dL — ABNORMAL HIGH (ref 70–99)
Potassium: 4.9 mEq/L (ref 3.5–5.1)
Sodium: 142 mEq/L (ref 135–145)

## 2019-01-09 LAB — HEPATIC FUNCTION PANEL
ALT: 10 U/L (ref 0–53)
AST: 15 U/L (ref 0–37)
Albumin: 3.9 g/dL (ref 3.5–5.2)
Alkaline Phosphatase: 71 U/L (ref 39–117)
Bilirubin, Direct: 0.1 mg/dL (ref 0.0–0.3)
Total Bilirubin: 0.7 mg/dL (ref 0.2–1.2)
Total Protein: 6.8 g/dL (ref 6.0–8.3)

## 2019-01-09 LAB — CBC WITH DIFFERENTIAL/PLATELET
Basophils Absolute: 0.1 10*3/uL (ref 0.0–0.1)
Basophils Relative: 0.7 % (ref 0.0–3.0)
Eosinophils Absolute: 1.5 10*3/uL — ABNORMAL HIGH (ref 0.0–0.7)
Eosinophils Relative: 14.5 % — ABNORMAL HIGH (ref 0.0–5.0)
HCT: 35.7 % — ABNORMAL LOW (ref 39.0–52.0)
Hemoglobin: 11.7 g/dL — ABNORMAL LOW (ref 13.0–17.0)
Lymphocytes Relative: 20.2 % (ref 12.0–46.0)
Lymphs Abs: 2 10*3/uL (ref 0.7–4.0)
MCHC: 32.7 g/dL (ref 30.0–36.0)
MCV: 95.2 fl (ref 78.0–100.0)
Monocytes Absolute: 0.8 10*3/uL (ref 0.1–1.0)
Monocytes Relative: 7.5 % (ref 3.0–12.0)
Neutro Abs: 5.8 10*3/uL (ref 1.4–7.7)
Neutrophils Relative %: 57.1 % (ref 43.0–77.0)
Platelets: 282 10*3/uL (ref 150.0–400.0)
RBC: 3.75 Mil/uL — ABNORMAL LOW (ref 4.22–5.81)
RDW: 13.1 % (ref 11.5–15.5)
WBC: 10.1 10*3/uL (ref 4.0–10.5)

## 2019-01-09 LAB — VITAMIN B12: Vitamin B-12: 863 pg/mL (ref 211–911)

## 2019-01-09 LAB — VITAMIN D 25 HYDROXY (VIT D DEFICIENCY, FRACTURES): VITD: 34.64 ng/mL (ref 30.00–100.00)

## 2019-01-09 LAB — IBC PANEL
Iron: 97 ug/dL (ref 42–165)
Saturation Ratios: 40.5 % (ref 20.0–50.0)
Transferrin: 171 mg/dL — ABNORMAL LOW (ref 212.0–360.0)

## 2019-01-09 NOTE — Assessment & Plan Note (Signed)
Stable, for f/u lab today 

## 2019-01-09 NOTE — Patient Instructions (Addendum)
You had the Tdap tetanus shot today  Please continue all other medications as before, and refills have been done if requested.  Please have the pharmacy call with any other refills you may need.  Please continue your efforts at being more active, low cholesterol diet, and weight control.  Please keep your appointments with your specialists as you may have planned  Please return in 6 months, or sooner if needed, with Lab testing done 3-5 days before

## 2019-01-09 NOTE — Assessment & Plan Note (Signed)
stable overall by history and exam, recent data reviewed with pt, and pt to continue medical treatment as before,  to f/u any worsening symptoms or concerns  

## 2019-01-09 NOTE — Progress Notes (Signed)
Subjective:    Patient ID: Stephen Wilkins, male    DOB: 1927/09/01, 85 y.o.   MRN: [id number]  HPI   Here to f/u recent hospn sept 42 - sept 93; Patient is 85 year old male with history of hypertension, hypothyroidism, asthma COPD, ad dementia presented with abdominal pain on and off for past few days, acutely worsened over 2 days. CT abdomen pelvis showed obstructing stones in the common bile duct with intrahepatic and extrahepatic biliary duct dilation.  Patient was admitted to hospital with GI consultation.  Acute abdominal pain secondary to choledocholithiasis and cholangitis with cholecystitis: Patient was treated with IV fluids, IV antibiotics with Zosyn, underwent ERCP on 12/28/2018 and found to have multiple stones and sludge, underwent biliary sphincterectomy and balloon extraction of the stones.  Did very good clinical recovery. Patient finished 5 days of IV Zosyn today, blood cultures negative. Resume his blood pressure medications at home.  Other chronic medical issues remained stable and he will be resuming his medications Today - Denies worsening reflux, abd pain, dysphagia, n/v, bowel change or blood.  Pt denies chest pain, increased sob or doe, wheezing, orthopnea, PND, increased LE swelling, palpitations, dizziness or syncope. Denies hyper or hypo thyroid symptoms such as voice, skin or hair change.  Past Medical History:  Diagnosis Date  . ABNORMAL ELECTROCARDIOGRAM 06/21/2007  . ALLERGIC RHINITIS 03/23/2007  . Anxiety state, unspecified 09/06/2013  . ASTHMATIC BRONCHITIS, ACUTE 04/27/2007  . BENIGN PROSTATIC HYPERTROPHY 03/23/2007  . BRONCHITIS NOT SPECIFIED AS ACUTE OR CHRONIC 04/21/2007  . BURSITIS, RIGHT HIP 07/31/2008  . COPD (chronic obstructive pulmonary disease) ([place]) 04/19/2016  . Cough 01/29/2009  . Dementia ([place]) 09/01/2010  . ERECTILE DYSFUNCTION 03/23/2007  . ERECTILE DYSFUNCTION, ORGANIC 04/17/2009  . Esophageal stricture   . FREQUENCY, URINARY 04/26/2010  . GERD  03/23/2007  . HIATAL HERNIA   . HYPERLIPIDEMIA 03/23/2007  . HYPERTENSION 03/23/2007  . MILD COGNITIVE IMPAIRMENT SO STATED 05/19/2010  . NEPHROLITHIASIS, HX OF 03/23/2007  . PSA, INCREASED 06/27/2008  . RASH-NONVESICULAR 03/23/2007  . REACTIVE AIRWAY DISEASE 01/14/2010  . SCHATZKI'S RING   . SCIATICA, RIGHT 04/28/2008  . Unspecified hypothyroidism 09/06/2013  . Wheezing 04/17/2009   Past Surgical History:  Procedure Laterality Date  . ERCP N/A 12/28/2018   Procedure: ENDOSCOPIC RETROGRADE CHOLANGIOPANCREATOGRAPHY (ERCP);  Surgeon: Ladene Artist, MD;  Location: [facility];  Service: Endoscopy;  Laterality: N/A;  . REMOVAL OF STONES  12/28/2018   Procedure: REMOVAL OF STONES;  Surgeon: Ladene Artist, MD;  Location: [facility];  Service: Endoscopy;;  balloon sweep  . SPHINCTEROTOMY  12/28/2018   Procedure: SPHINCTEROTOMY;  Surgeon: Ladene Artist, MD;  Location: [facility];  Service: Endoscopy;;  . TONSILLECTOMY      reports that he has never smoked. He has never used smokeless tobacco. He reports that he does not drink alcohol or use drugs. family history includes Asthma in his sister; Cancer in an other family member; Emphysema in his brother; Hypertension in an other family member; Lung cancer in his brother. Allergies  Allergen Reactions  . Atorvastatin     REACTION: myalgia  . Doxazosin Mesylate     REACTION: dizziness  . Hydrocodone-Homatropine Other (See Comments)    anxiety   Current Outpatient Medications on File Prior to Visit  Medication Sig Dispense Refill  . aspirin EC 81 MG tablet Take 1 tablet (81 mg total) by mouth daily. (Patient taking differently: Take 81 mg by mouth 2 (two) times a week. ) 90 tablet  11  . Cholecalciferol (VITAMIN D) 1000 UNITS capsule Take 1,000 Units by mouth every other day.     . desoximetasone (TOPICORT) 0.25 % cream APPLY SPARINGLY TO THE AFFECTED AREA TWICE DAILY AS NEEDED AS DIRECTED (Patient taking differently: Apply 1 application  topically 2 (two) times daily. Itching) 30 g 1  . lansoprazole (PREVACID) 30 MG capsule Take 1 capsule (30 mg total) by mouth daily at 12 noon. (Patient taking differently: Take 30 mg by mouth daily as needed (heart burn). ) 90 capsule 3  . levothyroxine (SYNTHROID, LEVOTHROID) 112 MCG tablet Take 1 tablet (112 mcg total) by mouth daily. 90 tablet 0  . losartan (COZAAR) 100 MG tablet Take 1 tablet (100 mg total) by mouth daily. 90 tablet 1  . Multiple Vitamins-Minerals (CENTRUM SILVER PO) Take 1 tablet by mouth daily.     . polyethylene glycol powder (MIRALAX) powder Take 1/2 capful by mouth once daily (Patient taking differently: Take 17 g by mouth 2 (two) times a week. ) 850 g 5  . tamsulosin (FLOMAX) 0.4 MG CAPS capsule Take 1 capsule (0.4 mg total) by mouth at bedtime. 90 capsule 0  . vitamin B-12 (CYANOCOBALAMIN) 100 MCG tablet Take 100 mcg by mouth once a week.    . vitamin C (ASCORBIC ACID) 500 MG tablet Take 1,000 mg by mouth daily.     Marland Kitchen albuterol (PROVENTIL HFA;VENTOLIN HFA) 108 (90 Base) MCG/ACT inhaler Inhale 2 puffs into the lungs every 6 (six) hours as needed for wheezing. 1 Inhaler 11   No current facility-administered medications on file prior to visit.    Review of Systems  Constitutional: Negative for other unusual diaphoresis or sweats HENT: Negative for ear discharge or swelling Eyes: Negative for other worsening visual disturbances Respiratory: Negative for stridor or other swelling  Gastrointestinal: Negative for worsening distension or other blood Genitourinary: Negative for retention or other urinary change Musculoskeletal: Negative for other MSK pain or swelling Skin: Negative for color change or other new lesions Neurological: Negative for worsening tremors and other numbness  Psychiatric/Behavioral: Negative for worsening agitation or other fatigue All otherwise neg per pt    Objective:   Physical Exam BP 128/84   Pulse 83   Temp 98 F (36.7 C) (Oral)   Ht  6' (1.829 m)   Wt 199 lb (90.3 kg)   SpO2 93%   BMI 26.99 kg/m  VS noted,  Constitutional: Pt appears in NAD HENT: Head: NCAT.  Right Ear: External ear normal.  Left Ear: External ear normal.  Eyes: . Pupils are equal, round, and reactive to light. Conjunctivae and EOM are normal Nose: without d/c or deformity Neck: Neck supple. Gross normal ROM Cardiovascular: Normal rate and regular rhythm.   Pulmonary/Chest: Effort normal and breath sounds without rales or wheezing.  Abd:  Soft, NT, ND, + BS, no organomegaly Neurological: Pt is alert. At baseline orientation, motor grossly intact Skin: Skin is warm. No rashes, other new lesions, no LE edema Psychiatric: Pt behavior is normal without agitation  All otherwise neg per pt Lab Results  Component Value Date   WBC 10.3 01/01/2019   HGB 10.7 (L) 01/01/2019   HCT 33.8 (L) 01/01/2019   PLT 245 01/01/2019   GLUCOSE 102 (H) 01/01/2019   CHOL 138 08/29/2017   TRIG 201.0 (H) 08/29/2017   HDL 47.80 08/29/2017   LDLDIRECT 68.0 08/29/2017   LDLCALC 119 (H) 07/21/2016   ALT 33 12/29/2018   AST 42 (H) 12/29/2018   NA 140  01/01/2019   K 4.4 01/01/2019   CL 110 01/01/2019   CREATININE 2.13 (H) 01/01/2019   BUN 31 (H) 01/01/2019   CO2 22 01/01/2019   TSH 0.595 12/29/2018   PSA 3.94 03/12/2014   HGBA1C 6.4 08/29/2017      Assessment & Plan:

## 2019-01-14 ENCOUNTER — Encounter: Payer: Self-pay | Admitting: Internal Medicine

## 2019-01-16 NOTE — Telephone Encounter (Signed)
Staff to urge pt to go to UC for acute resp failure (low o2 sats) now

## 2019-01-16 NOTE — Telephone Encounter (Signed)
Pt has been informed and going to UC now.

## 2019-02-13 DIAGNOSIS — L820 Inflamed seborrheic keratosis: Secondary | ICD-10-CM | POA: Diagnosis not present

## 2019-02-13 DIAGNOSIS — D485 Neoplasm of uncertain behavior of skin: Secondary | ICD-10-CM | POA: Diagnosis not present

## 2019-02-13 DIAGNOSIS — L570 Actinic keratosis: Secondary | ICD-10-CM | POA: Diagnosis not present

## 2019-02-13 DIAGNOSIS — L821 Other seborrheic keratosis: Secondary | ICD-10-CM | POA: Diagnosis not present

## 2019-02-13 DIAGNOSIS — D692 Other nonthrombocytopenic purpura: Secondary | ICD-10-CM | POA: Diagnosis not present

## 2019-02-13 DIAGNOSIS — L2089 Other atopic dermatitis: Secondary | ICD-10-CM | POA: Diagnosis not present

## 2019-02-13 DIAGNOSIS — D1801 Hemangioma of skin and subcutaneous tissue: Secondary | ICD-10-CM | POA: Diagnosis not present

## 2019-02-13 DIAGNOSIS — L400 Psoriasis vulgaris: Secondary | ICD-10-CM | POA: Diagnosis not present

## 2019-02-13 DIAGNOSIS — D2261 Melanocytic nevi of right upper limb, including shoulder: Secondary | ICD-10-CM | POA: Diagnosis not present

## 2019-02-14 DIAGNOSIS — N401 Enlarged prostate with lower urinary tract symptoms: Secondary | ICD-10-CM | POA: Diagnosis not present

## 2019-02-14 DIAGNOSIS — N5201 Erectile dysfunction due to arterial insufficiency: Secondary | ICD-10-CM | POA: Diagnosis not present

## 2019-02-14 DIAGNOSIS — R351 Nocturia: Secondary | ICD-10-CM | POA: Diagnosis not present

## 2019-02-18 ENCOUNTER — Other Ambulatory Visit: Payer: Self-pay | Admitting: Internal Medicine

## 2019-02-18 NOTE — Telephone Encounter (Signed)
Pound for routine med refill request per shirron

## 2019-02-18 NOTE — Telephone Encounter (Signed)
Pt's spouse called in for levothyroxine (SYNTHROID, LEVOTHROID) 112 MCG tablet refill    Pharmacy:  [facility] [id number] [place], [place] [phone] (Phone) [phone] (Fax)

## 2019-02-19 MED ORDER — LEVOTHYROXINE SODIUM 112 MCG PO TABS: 112.0000 ug | ORAL_TABLET | Freq: Every day | ORAL | 0 refills | Status: DC

## 2019-03-04 DIAGNOSIS — H18529 Epithelial (juvenile) corneal dystrophy, unspecified eye: Secondary | ICD-10-CM | POA: Insufficient documentation

## 2019-03-04 DIAGNOSIS — H524 Presbyopia: Secondary | ICD-10-CM | POA: Diagnosis not present

## 2019-03-04 DIAGNOSIS — H52203 Unspecified astigmatism, bilateral: Secondary | ICD-10-CM | POA: Diagnosis not present

## 2019-03-04 DIAGNOSIS — H16223 Keratoconjunctivitis sicca, not specified as Sjogren's, bilateral: Secondary | ICD-10-CM | POA: Diagnosis not present

## 2019-03-04 DIAGNOSIS — H43813 Vitreous degeneration, bilateral: Secondary | ICD-10-CM | POA: Diagnosis not present

## 2019-03-04 DIAGNOSIS — H18593 Other hereditary corneal dystrophies, bilateral: Secondary | ICD-10-CM | POA: Diagnosis not present

## 2019-03-04 DIAGNOSIS — H5201 Hypermetropia, right eye: Secondary | ICD-10-CM | POA: Diagnosis not present

## 2019-03-04 DIAGNOSIS — Z961 Presence of intraocular lens: Secondary | ICD-10-CM | POA: Insufficient documentation

## 2019-03-04 DIAGNOSIS — Z83518 Family history of other specified eye disorder: Secondary | ICD-10-CM | POA: Diagnosis not present

## 2019-03-19 ENCOUNTER — Other Ambulatory Visit: Payer: Self-pay | Admitting: Internal Medicine

## 2019-03-19 MED ORDER — LOSARTAN POTASSIUM 100 MG PO TABS: 100.0000 mg | ORAL_TABLET | Freq: Every day | ORAL | 0 refills | Status: DC

## 2019-03-19 NOTE — Telephone Encounter (Signed)
Medication Refill - Medication: losartan   Has the patient contacted their pharmacy? Yes.   (Agent: If no, request that the patient contact the pharmacy for the refill.) (Agent: If yes, when and what did the pharmacy advise?)  Preferred Pharmacy (with phone number or street name):  [id number] [place], [place]  [address] [place] [address]  Phone: [phone] Fax: [phone]  Not a 24 hour pharmacy; exact hours not known.     Agent: Please be advised that RX refills may take up to 3 business days. We ask that you follow-up with your pharmacy.

## 2019-04-18 ENCOUNTER — Other Ambulatory Visit: Payer: Self-pay | Admitting: Internal Medicine

## 2019-04-18 MED ORDER — TAMSULOSIN HCL 0.4 MG PO CAPS: 0.4000 mg | ORAL_CAPSULE | Freq: Every day | ORAL | 0 refills | Status: DC

## 2019-04-18 NOTE — Telephone Encounter (Signed)
Pt wife said the pharm called for a refill. Pt need a refill on tamsulsoin. [facility] [address]

## 2019-05-17 DIAGNOSIS — M65311 Trigger thumb, right thumb: Secondary | ICD-10-CM | POA: Insufficient documentation

## 2019-05-17 DIAGNOSIS — G8929 Other chronic pain: Secondary | ICD-10-CM | POA: Diagnosis not present

## 2019-05-17 DIAGNOSIS — M1811 Unilateral primary osteoarthritis of first carpometacarpal joint, right hand: Secondary | ICD-10-CM | POA: Diagnosis not present

## 2019-05-17 DIAGNOSIS — M79644 Pain in right finger(s): Secondary | ICD-10-CM | POA: Insufficient documentation

## 2019-05-26 ENCOUNTER — Other Ambulatory Visit: Payer: Self-pay | Admitting: Internal Medicine

## 2019-05-26 NOTE — Telephone Encounter (Signed)
Please refill as per office routine med refill policy (all routine meds refilled for 3 mo or monthly per pt preference up to one year from last visit, then month to month grace period for 3 mo, then further med refills will have to be denied)  

## 2019-06-24 ENCOUNTER — Other Ambulatory Visit: Payer: Self-pay | Admitting: Internal Medicine

## 2019-08-12 ENCOUNTER — Other Ambulatory Visit: Payer: Self-pay | Admitting: Internal Medicine

## 2019-08-12 NOTE — Telephone Encounter (Signed)
Please refill as per office routine med refill policy (all routine meds refilled for 3 mo or monthly per pt preference up to one year from last visit, then month to month grace period for 3 mo, then further med refills will have to be denied)  

## 2019-11-07 ENCOUNTER — Ambulatory Visit (INDEPENDENT_AMBULATORY_CARE_PROVIDER_SITE_OTHER): Payer: PPO

## 2019-11-07 ENCOUNTER — Other Ambulatory Visit: Payer: Self-pay

## 2019-11-07 VITALS — BP 130/80 | HR 77 | Temp 98.3°F | Resp 16 | Ht 72.0 in | Wt 201.8 lb

## 2019-11-07 DIAGNOSIS — Z0000 Encounter for general adult medical examination without abnormal findings: Secondary | ICD-10-CM

## 2019-11-07 NOTE — Progress Notes (Signed)
Subjective:   Stephen Wilkins is a 86 y.o. male who presents for Medicare Annual/Subsequent preventive examination.  Review of Systems   No ROS. Medicare Wellness Visit Cardiac Risk Factors include: advanced age (>69men, >41 women);dyslipidemia;family history of premature cardiovascular disease;hypertension;male gender     Objective:    Today's Vitals   11/07/19 0859 11/07/19 0905  BP: 130/80   Pulse: 77   Resp: 16   Temp: 98.3 F (36.8 C)   SpO2: 97%   Weight: 201 lb 12.8 oz (91.5 kg)   Height: 6' (1.829 m)   PainSc: 0-No pain 0-No pain   Body mass index is 27.37 kg/m.  Advanced Directives 11/07/2019 12/28/2018 12/28/2018 12/28/2018 12/27/2018 04/13/2017 01/10/2017  Does Patient Have a Medical Advance Directive? Yes Yes Yes Yes No Yes Yes  Type of Paramedic of [place];Living will Living will Living will Living will - [place];Living will [place];Living will  Does patient want to make changes to medical advance directive? No - Patient declined - No - Patient declined - - - -  Copy of [place] in Chart? No - copy requested - - - - No - copy requested -    Current Medications (verified) Outpatient Encounter Medications as of 11/07/2019  Medication Sig  . aspirin EC 81 MG tablet Take 1 tablet (81 mg total) by mouth daily. (Patient taking differently: Take 81 mg by mouth 2 (two) times a week. )  . Cholecalciferol (VITAMIN D) 1000 UNITS capsule Take 1,000 Units by mouth every other day.   . desoximetasone (TOPICORT) 0.25 % cream APPLY SPARINGLY TO THE AFFECTED AREA TWICE DAILY AS NEEDED AS DIRECTED (Patient taking differently: Apply 1 application topically 2 (two) times daily. Itching)  . lansoprazole (PREVACID) 30 MG capsule Take 1 capsule (30 mg total) by mouth daily at 12 noon. (Patient taking differently: Take 30 mg by mouth daily as needed (heart burn). )  . levothyroxine (SYNTHROID) 112 MCG tablet  TAKE 1 TABLET(112 MCG) BY MOUTH DAILY  . levothyroxine (SYNTHROID) 112 MCG tablet TAKE 1 TABLET(112 MCG) BY MOUTH DAILY  . losartan (COZAAR) 100 MG tablet TAKE 1 TABLET(100 MG) BY MOUTH DAILY  . Multiple Vitamins-Minerals (CENTRUM SILVER PO) Take 1 tablet by mouth daily.   . polyethylene glycol powder (MIRALAX) powder Take 1/2 capful by mouth once daily (Patient taking differently: Take 17 g by mouth 2 (two) times a week. )  . tamsulosin (FLOMAX) 0.4 MG CAPS capsule TAKE 1 CAPSULE(0.4 MG) BY MOUTH AT BEDTIME  . vitamin B-12 (CYANOCOBALAMIN) 100 MCG tablet Take 100 mcg by mouth once a week.  . vitamin C (ASCORBIC ACID) 500 MG tablet Take 1,000 mg by mouth daily.   Marland Kitchen albuterol (PROVENTIL HFA;VENTOLIN HFA) 108 (90 Base) MCG/ACT inhaler Inhale 2 puffs into the lungs every 6 (six) hours as needed for wheezing.   No facility-administered encounter medications on file as of 11/07/2019.    Allergies (verified) Atorvastatin, Doxazosin mesylate, and Hydrocodone-homatropine   History: Past Medical History:  Diagnosis Date  . ABNORMAL ELECTROCARDIOGRAM 06/21/2007  . ALLERGIC RHINITIS 03/23/2007  . Anxiety state, unspecified 09/06/2013  . ASTHMATIC BRONCHITIS, ACUTE 04/27/2007  . BENIGN PROSTATIC HYPERTROPHY 03/23/2007  . BRONCHITIS NOT SPECIFIED AS ACUTE OR CHRONIC 04/21/2007  . BURSITIS, RIGHT HIP 07/31/2008  . COPD (chronic obstructive pulmonary disease) ([place]) 04/19/2016  . Cough 01/29/2009  . Dementia ([place]) 09/01/2010  . ERECTILE DYSFUNCTION 03/23/2007  . ERECTILE DYSFUNCTION, ORGANIC 04/17/2009  .  Esophageal stricture   . FREQUENCY, URINARY 04/26/2010  . GERD 03/23/2007  . HIATAL HERNIA   . HYPERLIPIDEMIA 03/23/2007  . HYPERTENSION 03/23/2007  . MILD COGNITIVE IMPAIRMENT SO STATED 05/19/2010  . NEPHROLITHIASIS, HX OF 03/23/2007  . PSA, INCREASED 06/27/2008  . RASH-NONVESICULAR 03/23/2007  . REACTIVE AIRWAY DISEASE 01/14/2010  . SCHATZKI'S RING   . SCIATICA, RIGHT 04/28/2008  . Unspecified  hypothyroidism 09/06/2013  . Wheezing 04/17/2009   Past Surgical History:  Procedure Laterality Date  . ERCP N/A 12/28/2018   Procedure: ENDOSCOPIC RETROGRADE CHOLANGIOPANCREATOGRAPHY (ERCP);  Surgeon: Ladene Artist, MD;  Location: [facility];  Service: Endoscopy;  Laterality: N/A;  . REMOVAL OF STONES  12/28/2018   Procedure: REMOVAL OF STONES;  Surgeon: Ladene Artist, MD;  Location: [facility];  Service: Endoscopy;;  balloon sweep  . SPHINCTEROTOMY  12/28/2018   Procedure: SPHINCTEROTOMY;  Surgeon: Ladene Artist, MD;  Location: [facility];  Service: Endoscopy;;  . TONSILLECTOMY     Family History  Problem Relation Age of Onset  . Lung cancer Brother        smoked  . Cancer Other        lung cancer  . Hypertension Other   . Emphysema Brother        smoked  . Asthma Sister    Social History   Socioeconomic History  . Marital status: Married    Spouse name: Not on file  . Number of children: Not on file  . Years of education: Not on file  . Highest education level: Not on file  Occupational History  . 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    Employer: RETIRED  Tobacco Use  . Smoking status: Never Smoker  . Smokeless tobacco: Never Used  . Tobacco comment: tried smoking in HS  Vaping Use  . Vaping Use: Never used  Substance and Sexual Activity  . Alcohol use: No  . Drug use: No  . Sexual activity: Never  Other Topics Concern  . Not on file  Social History Narrative  . Not on file   Social Determinants of Health   Financial Resource Strain: Low Risk   . Difficulty of Paying Living Expenses: Not hard at all  Food Insecurity: No Food Insecurity  . Worried About Charity fundraiser in the Last Year: Never true  . Ran Out of Food in the Last Year: Never true  Transportation Needs: No Transportation Needs  . Lack of Transportation (Medical): No  . Lack of Transportation (Non-Medical): No  Physical Activity: Sufficiently  Active  . Days of Exercise per Week: 5 days  . Minutes of Exercise per Session: 30 min  Stress: No Stress Concern Present  . Feeling of Stress : Not at all  Social Connections: Unknown  . Frequency of Communication with Friends and Family: More than three times a week  . Frequency of Social Gatherings with Friends and Family: Once a week  . Attends Religious Services: Patient refused  . Active Member of Clubs or Organizations: Patient refused  . Attends Archivist Meetings: Patient refused  . Marital Status: Married    Tobacco Counseling Counseling given: No Comment: tried smoking in HS   Clinical Intake:  Pre-visit preparation completed: Yes  Pain : No/denies pain Pain Score: 0-No pain     BMI - recorded: 27.37 Nutritional Status: BMI 25 -29 Overweight Nutritional Risks: None Diabetes: No  How often do you need to have  someone help you when you read instructions, pamphlets, or other written materials from your doctor or pharmacy?: 1 - Never What is the last grade level you completed in school?: [employer], some college  Diabetic? no  Interpreter Needed?: No  Information entered by :: Merilee Wible N. Takahiro Godinho, LPN   Activities of Daily Living In your present state of health, do you have any difficulty performing the following activities: 11/07/2019 12/28/2018  Hearing? Y -  Comment wears hearing aids -  Vision? N -  Difficulty concentrating or making decisions? Y -  Comment has slight STM loss -  Walking or climbing stairs? Y -  Dressing or bathing? N -  Doing errands, shopping? N N  Preparing Food and eating ? N -  Using the Toilet? N -  In the past six months, have you accidently leaked urine? N -  Do you have problems with loss of bowel control? N -  Managing your Medications? N -  Managing your Finances? N -  Housekeeping or managing your Housekeeping? N -  Some recent data might be hidden    Patient Care Team: Biagio Borg, MD as PCP -  General  Indicate any recent Medical Services you may have received from other than [facility] providers in the past year (date may be approximate).     Assessment:   This is a routine wellness examination for Stephen Wilkins.  Hearing/Vision screen No exam data present  Dietary issues and exercise activities discussed: Current Exercise Habits: Home exercise routine, Type of exercise: walking, Time (Minutes): 30, Frequency (Times/Week): 5, Weekly Exercise (Minutes/Week): 150, Intensity: Mild, Exercise limited by: None identified  Goals    . Patient Stated     Stay as active and as independent as possible. Continue to go to the gym several times per week.      Depression Screen PHQ 2/9 Scores 11/07/2019 08/29/2017 04/13/2017 04/01/2016 07/29/2015 04/30/2015 03/12/2014  PHQ - 2 Score 0 0 0 0 0 0 0  PHQ- 9 Score - - 4 - - - -    Fall Risk Fall Risk  11/07/2019 11/02/2018 08/29/2017 04/13/2017 04/01/2016  Falls in the past year? 0 0 Yes Yes No  Comment - Emmi Telephone Survey: data to providers prior to load - - -  Number falls in past yr: 0 - 1 1 -  Injury with Fall? 0 - No Yes -  Risk for fall due to : Impaired balance/gait - - Impaired mobility;Impaired balance/gait -  Follow up Falls evaluation completed;Education provided - - Falls prevention discussed;Education provided -    Any stairs in or around the home? No  If so, are there any without handrails? No  Home free of loose throw rugs in walkways, pet beds, electrical cords, etc? Yes  Adequate lighting in your home to reduce risk of falls? Yes   ASSISTIVE DEVICES UTILIZED TO PREVENT FALLS:  Life alert? No  Use of a cane, walker or w/c? Yes  Grab bars in the bathroom? Yes  Shower chair or bench in shower? No  Elevated toilet seat or a handicapped toilet? No   TIMED UP AND GO:  Was the test performed? No .  Length of time to ambulate 10 feet: 0 sec.   Gait steady and fast without use of assistive device  Cognitive Function: MMSE - Mini  Mental State Exam 04/13/2017  Orientation to time 5  Orientation to Place 5  Registration 3  Attention/ Calculation 5  Recall 1  Language- name 2 objects  2  Language- repeat 1  Language- follow 3 step command 3  Language- read & follow direction 1  Write a sentence 1  Copy design 1  Total score 28     6CIT Screen 11/07/2019  What Year? 0 points  What month? 0 points  What time? 3 points  Count back from 20 0 points  Months in reverse 0 points  Repeat phrase 4 points  Total Score 7    Immunizations Immunization History  Administered Date(s) Administered  . Fluad Quad(high Dose 65+) 12/29/2018  . Influenza Split 01/09/2012  . Influenza Whole 03/23/2007, 02/02/2009, 01/13/2010  . Influenza, High Dose Seasonal PF 02/07/2014, 01/28/2015, 02/28/2017, 01/19/2018  . Influenza,inj,Quad PF,6+ Mos 12/05/2012  . Pneumococcal Conjugate-13 06/05/2013  . Pneumococcal Polysaccharide-23 03/23/2007  . Td 07/31/2008  . Tdap 01/09/2019    TDAP status: Up to date Flu Vaccine status: Up to date Pneumococcal vaccine status: Up to date Covid-19 vaccine status: Completed vaccines  Qualifies for Shingles Vaccine? Yes   Zostavax completed Yes   Shingrix Completed?: No.    Education has been provided regarding the importance of this vaccine. Patient has been advised to call insurance company to determine out of pocket expense if they have not yet received this vaccine. Advised may also receive vaccine at local pharmacy or Health Dept. Verbalized acceptance and understanding.  Screening Tests Health Maintenance  Topic Date Due  . COVID-19 Vaccine (1) Never done  . INFLUENZA VACCINE  11/03/2019  . TETANUS/TDAP  01/08/2029  . PNA vac Low Risk Adult  Completed    Health Maintenance  Health Maintenance Due  Topic Date Due  . COVID-19 Vaccine (1) Never done  . INFLUENZA VACCINE  11/03/2019    Colorectal cancer screening: No longer required.   Lung Cancer Screening: (Low Dose CT Chest  recommended if Age 83-80 years, 30 pack-year currently smoking OR have quit w/in 15years.) does not qualify.   Lung Cancer Screening Referral: no  Additional Screening:  Hepatitis C Screening: does not qualify; Completed no  Vision Screening: Recommended annual ophthalmology exams for early detection of glaucoma and other disorders of the eye. Is the patient up to date with their annual eye exam?  Yes  Who is the provider or what is the name of the office in which the patient attends annual eye exams? Jola Schmidt, MD If pt is not established with a provider, would they like to be referred to a provider to establish care? No .   Dental Screening: Recommended annual dental exams for proper oral hygiene  Community Resource Referral / Chronic Care Management: CRR required this visit?  No   CCM required this visit?  No      Plan:     I have personally reviewed and noted the following in the patient's chart:   . Medical and social history . Use of alcohol, tobacco or illicit drugs  . Current medications and supplements . Functional ability and status . Nutritional status . Physical activity . Advanced directives . List of other physicians . Hospitalizations, surgeries, and ER visits in previous 12 months . Vitals . Screenings to include cognitive, depression, and falls . Referrals and appointments  In addition, I have reviewed and discussed with patient certain preventive protocols, quality metrics, and best practice recommendations. A written personalized care plan for preventive services as well as general preventive health recommendations were provided to patient.     Sheral Flow, LPN   04/13/5091   Nurse Notes: n/a

## 2019-11-07 NOTE — Patient Instructions (Addendum)
Stephen Wilkins , Thank you for taking time to come for your Medicare Wellness Visit. I appreciate your ongoing commitment to your health goals. Please review the following plan we discussed and let me know if I can assist you in the future.   Screening recommendations/referrals: Colonoscopy: not a candidate for colon cancer screening due to age Recommended yearly ophthalmology/optometry visit for glaucoma screening and checkup Recommended yearly dental visit for hygiene and checkup  Vaccinations: Influenza vaccine: 12/29/2018; due every year     Pneumococcal vaccine: completed Tdap vaccine: 01/09/2019; due every 10 years Shingles vaccine: never done   Covid-19: completed  Advanced directives: Please bring a copy of your health care power of attorney and living will to the office at your convenience.  Conditions/risks identified: Yes; Please continue to do your personal lifestyle choices by: daily care of teeth and gums, regular physical activity (goal should be 5 days a week for 30 minutes), eat a healthy diet, avoid tobacco and drug use, limiting any alcohol intake, taking a low-dose aspirin (if not allergic or have been advised by your provider otherwise) and taking vitamins and minerals as recommended by your provider. Continue doing brain stimulating activities (puzzles, reading, adult coloring books, staying active) to keep memory sharp. Continue to eat heart healthy diet (full of fruits, vegetables, whole grains, lean protein, water--limit salt, fat, and sugar intake) and increase physical activity as tolerated.  Next appointment: Please schedule your next Medicare Wellness Visit with your Nurse Health Advisor in 1 year.  Preventive Care 38 Years and Older, Male Preventive care refers to lifestyle choices and visits with your health care provider that can promote health and wellness. What does preventive care include?  A yearly physical exam. This is also called an annual well  check.  Dental exams once or twice a year.  Routine eye exams. Ask your health care provider how often you should have your eyes checked.  Personal lifestyle choices, including:  Daily care of your teeth and gums.  Regular physical activity.  Eating a healthy diet.  Avoiding tobacco and drug use.  Limiting alcohol use.  Practicing safe sex.  Taking low doses of aspirin every day.  Taking vitamin and mineral supplements as recommended by your health care provider. What happens during an annual well check? The services and screenings done by your health care provider during your annual well check will depend on your age, overall health, lifestyle risk factors, and family history of disease. Counseling  Your health care provider may ask you questions about your:  Alcohol use.  Tobacco use.  Drug use.  Emotional well-being.  Home and relationship well-being.  Sexual activity.  Eating habits.  History of falls.  Memory and ability to understand (cognition).  Work and work Statistician. Screening  You may have the following tests or measurements:  Height, weight, and BMI.  Blood pressure.  Lipid and cholesterol levels. These may be checked every 5 years, or more frequently if you are over 32 years old.  Skin check.  Lung cancer screening. You may have this screening every year starting at age 45 if you have a 30-pack-year history of smoking and currently smoke or have quit within the past 15 years.  Fecal occult blood test (FOBT) of the stool. You may have this test every year starting at age 24.  Flexible sigmoidoscopy or colonoscopy. You may have a sigmoidoscopy every 5 years or a colonoscopy every 10 years starting at age 22.  Prostate cancer screening. Recommendations will  vary depending on your family history and other risks.  Hepatitis C blood test.  Hepatitis B blood test.  Sexually transmitted disease (STD) testing.  Diabetes screening. This is  done by checking your blood sugar (glucose) after you have not eaten for a while (fasting). You may have this done every 1-3 years.  Abdominal aortic aneurysm (AAA) screening. You may need this if you are a current or former smoker.  Osteoporosis. You may be screened starting at age 20 if you are at high risk. Talk with your health care provider about your test results, treatment options, and if necessary, the need for more tests. Vaccines  Your health care provider may recommend certain vaccines, such as:  Influenza vaccine. This is recommended every year.  Tetanus, diphtheria, and acellular pertussis (Tdap, Td) vaccine. You may need a Td booster every 10 years.  Zoster vaccine. You may need this after age 50.  Pneumococcal 13-valent conjugate (PCV13) vaccine. One dose is recommended after age 54.  Pneumococcal polysaccharide (PPSV23) vaccine. One dose is recommended after age 35. Talk to your health care provider about which screenings and vaccines you need and how often you need them. This information is not intended to replace advice given to you by your health care provider. Make sure you discuss any questions you have with your health care provider. Document Released: 04/17/2015 Document Revised: 12/09/2015 Document Reviewed: 01/20/2015 Elsevier Interactive Patient Education  2017 [place] Prevention in the Home Falls can cause injuries. They can happen to people of all ages. There are many things you can do to make your home safe and to help prevent falls. What can I do on the outside of my home?  Regularly fix the edges of walkways and driveways and fix any cracks.  Remove anything that might make you trip as you walk through a door, such as a raised step or threshold.  Trim any bushes or trees on the path to your home.  Use bright outdoor lighting.  Clear any walking paths of anything that might make someone trip, such as rocks or tools.  Regularly check to  see if handrails are loose or broken. Make sure that both sides of any steps have handrails.  Any raised decks and porches should have guardrails on the edges.  Have any leaves, snow, or ice cleared regularly.  Use sand or salt on walking paths during winter.  Clean up any spills in your garage right away. This includes oil or grease spills. What can I do in the bathroom?  Use night lights.  Install grab bars by the toilet and in the tub and shower. Do not use towel bars as grab bars.  Use non-skid mats or decals in the tub or shower.  If you need to sit down in the shower, use a plastic, non-slip stool.  Keep the floor dry. Clean up any water that spills on the floor as soon as it happens.  Remove soap buildup in the tub or shower regularly.  Attach bath mats securely with double-sided non-slip rug tape.  Do not have throw rugs and other things on the floor that can make you trip. What can I do in the bedroom?  Use night lights.  Make sure that you have a light by your bed that is easy to reach.  Do not use any sheets or blankets that are too big for your bed. They should not hang down onto the floor.  Have a firm chair that has  side arms. You can use this for support while you get dressed.  Do not have throw rugs and other things on the floor that can make you trip. What can I do in the kitchen?  Clean up any spills right away.  Avoid walking on wet floors.  Keep items that you use a lot in easy-to-reach places.  If you need to reach something above you, use a strong step stool that has a grab bar.  Keep electrical cords out of the way.  Do not use floor polish or wax that makes floors slippery. If you must use wax, use non-skid floor wax.  Do not have throw rugs and other things on the floor that can make you trip. What can I do with my stairs?  Do not leave any items on the stairs.  Make sure that there are handrails on both sides of the stairs and use  them. Fix handrails that are broken or loose. Make sure that handrails are as long as the stairways.  Check any carpeting to make sure that it is firmly attached to the stairs. Fix any carpet that is loose or worn.  Avoid having throw rugs at the top or bottom of the stairs. If you do have throw rugs, attach them to the floor with carpet tape.  Make sure that you have a light switch at the top of the stairs and the bottom of the stairs. If you do not have them, ask someone to add them for you. What else can I do to help prevent falls?  Wear shoes that:  Do not have high heels.  Have rubber bottoms.  Are comfortable and fit you well.  Are closed at the toe. Do not wear sandals.  If you use a stepladder:  Make sure that it is fully opened. Do not climb a closed stepladder.  Make sure that both sides of the stepladder are locked into place.  Ask someone to hold it for you, if possible.  Clearly mark and make sure that you can see:  Any grab bars or handrails.  First and last steps.  Where the edge of each step is.  Use tools that help you move around (mobility aids) if they are needed. These include:  Canes.  Walkers.  Scooters.  Crutches.  Turn on the lights when you go into a dark area. Replace any light bulbs as soon as they burn out.  Set up your furniture so you have a clear path. Avoid moving your furniture around.  If any of your floors are uneven, fix them.  If there are any pets around you, be aware of where they are.  Review your medicines with your doctor. Some medicines can make you feel dizzy. This can increase your chance of falling. Ask your doctor what other things that you can do to help prevent falls. This information is not intended to replace advice given to you by your health care provider. Make sure you discuss any questions you have with your health care provider. Document Released: 01/15/2009 Document Revised: 08/27/2015 Document Reviewed:  04/25/2014 Elsevier Interactive Patient Education  2017 [employer].

## 2019-11-19 ENCOUNTER — Ambulatory Visit: Payer: PPO | Admitting: Internal Medicine

## 2019-11-22 ENCOUNTER — Ambulatory Visit (INDEPENDENT_AMBULATORY_CARE_PROVIDER_SITE_OTHER): Payer: PPO | Admitting: Internal Medicine

## 2019-11-22 ENCOUNTER — Other Ambulatory Visit: Payer: Self-pay

## 2019-11-22 ENCOUNTER — Encounter: Payer: Self-pay | Admitting: Internal Medicine

## 2019-11-22 VITALS — BP 130/60 | HR 87 | Temp 98.2°F | Ht 72.0 in | Wt 200.0 lb

## 2019-11-22 DIAGNOSIS — E559 Vitamin D deficiency, unspecified: Secondary | ICD-10-CM

## 2019-11-22 DIAGNOSIS — Z0000 Encounter for general adult medical examination without abnormal findings: Secondary | ICD-10-CM | POA: Diagnosis not present

## 2019-11-22 DIAGNOSIS — M25511 Pain in right shoulder: Secondary | ICD-10-CM | POA: Diagnosis not present

## 2019-11-22 DIAGNOSIS — M25512 Pain in left shoulder: Secondary | ICD-10-CM | POA: Diagnosis not present

## 2019-11-22 DIAGNOSIS — E538 Deficiency of other specified B group vitamins: Secondary | ICD-10-CM

## 2019-11-22 DIAGNOSIS — R269 Unspecified abnormalities of gait and mobility: Secondary | ICD-10-CM

## 2019-11-22 DIAGNOSIS — M791 Myalgia, unspecified site: Secondary | ICD-10-CM

## 2019-11-22 DIAGNOSIS — R739 Hyperglycemia, unspecified: Secondary | ICD-10-CM

## 2019-11-22 LAB — COMPLETE METABOLIC PANEL WITH GFR
AG Ratio: 1.7 (calc) (ref 1.0–2.5)
ALT: 9 U/L (ref 9–46)
AST: 16 U/L (ref 10–35)
Albumin: 4.3 g/dL (ref 3.6–5.1)
Alkaline phosphatase (APISO): 79 U/L (ref 35–144)
BUN/Creatinine Ratio: 16 (calc) (ref 6–22)
BUN: 34 mg/dL — ABNORMAL HIGH (ref 7–25)
CO2: 29 mmol/L (ref 20–32)
Calcium: 9.5 mg/dL (ref 8.6–10.3)
Chloride: 104 mmol/L (ref 98–110)
Creat: 2.09 mg/dL — ABNORMAL HIGH (ref 0.70–1.11)
GFR, Est African American: 32 mL/min/{1.73_m2} — ABNORMAL LOW (ref >=60)
GFR, Est Non African American: 27 mL/min/{1.73_m2} — ABNORMAL LOW (ref >=60)
Globulin: 2.6 g/dL (calc) (ref 1.9–3.7)
Glucose, Bld: 96 mg/dL (ref 65–99)
Potassium: 5.2 mmol/L (ref 3.5–5.3)
Sodium: 140 mmol/L (ref 135–146)
Total Bilirubin: 1 mg/dL (ref 0.2–1.2)
Total Protein: 6.9 g/dL (ref 6.1–8.1)

## 2019-11-22 NOTE — Patient Instructions (Addendum)
Please continue all other medications as before, and refills have been done if requested.  Please have the pharmacy call with any other refills you may need.  Please continue your efforts at being more active, low cholesterol diet, and weight control.  You are otherwise up to date with prevention measures today.  Please keep your appointments with your specialists as you may have planned  You will be contacted regarding the referral for: Physical Therapy  Please make an appt for the shoulders with Sports Medicine on the first floor  Please go to the LAB at the blood drawing area for the tests to be done  You will be contacted by phone if any changes need to be made immediately.  Otherwise, you will receive a letter about your results with an explanation, but please check with MyChart first.  Please remember to sign up for MyChart if you have not done so, as this will be important to you in the future with finding out test results, communicating by private email, and scheduling acute appointments online when needed.  Please make an Appointment to return in 6 months, or sooner if needed

## 2019-11-22 NOTE — Progress Notes (Signed)
Subjective:    Patient ID: Stephen Wilkins, male    DOB: April 22, 1927, 86 y.o.   MRN: [id number]  HPI  Here for wellness and f/u;  Overall doing ok;  Pt denies Chest pain, worsening SOB, DOE, wheezing, orthopnea, PND, worsening LE edema, palpitations, dizziness or syncope.  Pt denies neurological change such as new headache, facial or extremity weakness.  Pt denies polydipsia, polyuria, or low sugar symptoms. Pt states overall good compliance with treatment and medications, good tolerability, and has been trying to follow appropriate diet.  Pt denies worsening depressive symptoms, suicidal ideation or panic. No fever, night sweats, wt loss, loss of appetite, or other constitutional symptoms.  Pt states good ability with ADL's, has low fall risk, home safety reviewed and adequate, no other significant changes in hearing or vision.  Walks with cane today.  Has bilateral shoulder pain worse in the past wk and cannot raise arms overhead.  Has also more diffuse non specific myalgias.  Also has ongoing gait issue maybe worsening balance recently, asks for PT eval Past Medical History:  Diagnosis Date  . ABNORMAL ELECTROCARDIOGRAM 06/21/2007  . ALLERGIC RHINITIS 03/23/2007  . Anxiety state, unspecified 09/06/2013  . ASTHMATIC BRONCHITIS, ACUTE 04/27/2007  . BENIGN PROSTATIC HYPERTROPHY 03/23/2007  . BRONCHITIS NOT SPECIFIED AS ACUTE OR CHRONIC 04/21/2007  . BURSITIS, RIGHT HIP 07/31/2008  . COPD (chronic obstructive pulmonary disease) ([place]) 04/19/2016  . Cough 01/29/2009  . Dementia ([place]) 09/01/2010  . ERECTILE DYSFUNCTION 03/23/2007  . ERECTILE DYSFUNCTION, ORGANIC 04/17/2009  . Esophageal stricture   . FREQUENCY, URINARY 04/26/2010  . GERD 03/23/2007  . HIATAL HERNIA   . HYPERLIPIDEMIA 03/23/2007  . HYPERTENSION 03/23/2007  . MILD COGNITIVE IMPAIRMENT SO STATED 05/19/2010  . NEPHROLITHIASIS, HX OF 03/23/2007  . PSA, INCREASED 06/27/2008  . RASH-NONVESICULAR 03/23/2007  . REACTIVE AIRWAY DISEASE 01/14/2010    . SCHATZKI'S RING   . SCIATICA, RIGHT 04/28/2008  . Unspecified hypothyroidism 09/06/2013  . Wheezing 04/17/2009   Past Surgical History:  Procedure Laterality Date  . ERCP N/A 12/28/2018   Procedure: ENDOSCOPIC RETROGRADE CHOLANGIOPANCREATOGRAPHY (ERCP);  Surgeon: Ladene Artist, MD;  Location: [facility];  Service: Endoscopy;  Laterality: N/A;  . REMOVAL OF STONES  12/28/2018   Procedure: REMOVAL OF STONES;  Surgeon: Ladene Artist, MD;  Location: [facility];  Service: Endoscopy;;  balloon sweep  . SPHINCTEROTOMY  12/28/2018   Procedure: SPHINCTEROTOMY;  Surgeon: Ladene Artist, MD;  Location: [facility];  Service: Endoscopy;;  . TONSILLECTOMY      reports that he has never smoked. He has never used smokeless tobacco. He reports that he does not drink alcohol and does not use drugs. family history includes Asthma in his sister; Cancer in an other family member; Emphysema in his brother; Hypertension in an other family member; Lung cancer in his brother. Allergies  Allergen Reactions  . Atorvastatin     REACTION: myalgia  . Doxazosin Mesylate     REACTION: dizziness  . Hydrocodone-Homatropine Other (See Comments)    anxiety   Current Outpatient Medications on File Prior to Visit  Medication Sig Dispense Refill  . amoxicillin (AMOXIL) 500 MG tablet Take 500 mg by mouth 3 (three) times daily.    Marland Kitchen aspirin EC 81 MG tablet Take 1 tablet (81 mg total) by mouth daily. (Patient taking differently: Take 81 mg by mouth 2 (two) times a week. ) 90 tablet 11  . Cholecalciferol (VITAMIN D) 1000 UNITS capsule Take 1,000 Units by mouth every  other day.     . desoximetasone (TOPICORT) 0.25 % cream APPLY SPARINGLY TO THE AFFECTED AREA TWICE DAILY AS NEEDED AS DIRECTED (Patient taking differently: Apply 1 application topically 2 (two) times daily. Itching) 30 g 1  . lansoprazole (PREVACID) 30 MG capsule Take 1 capsule (30 mg total) by mouth daily at 12 noon. (Patient taking differently: Take 30  mg by mouth daily as needed (heart burn). ) 90 capsule 3  . levothyroxine (SYNTHROID) 112 MCG tablet TAKE 1 TABLET(112 MCG) BY MOUTH DAILY 90 tablet 0  . losartan (COZAAR) 100 MG tablet TAKE 1 TABLET(100 MG) BY MOUTH DAILY 90 tablet 2  . Multiple Vitamins-Minerals (CENTRUM SILVER PO) Take 1 tablet by mouth daily.     . polyethylene glycol powder (MIRALAX) powder Take 1/2 capful by mouth once daily (Patient taking differently: Take 17 g by mouth 2 (two) times a week. ) 850 g 5  . tamsulosin (FLOMAX) 0.4 MG CAPS capsule TAKE 1 CAPSULE(0.4 MG) BY MOUTH AT BEDTIME 90 capsule 1  . vitamin B-12 (CYANOCOBALAMIN) 100 MCG tablet Take 100 mcg by mouth once a week.    . vitamin C (ASCORBIC ACID) 500 MG tablet Take 1,000 mg by mouth daily.     Marland Kitchen albuterol (PROVENTIL HFA;VENTOLIN HFA) 108 (90 Base) MCG/ACT inhaler Inhale 2 puffs into the lungs every 6 (six) hours as needed for wheezing. 1 Inhaler 11   No current facility-administered medications on file prior to visit.   Review of Systems All otherwise neg per pt    Objective:   Physical Exam BP 130/60 (BP Location: Left Arm, Patient Position: Sitting, Cuff Size: Large)   Pulse 87   Temp 98.2 F (36.8 C) (Oral)   Ht 6' (1.829 m)   Wt 200 lb (90.7 kg)   SpO2 93%   BMI 27.12 kg/m  VS noted,  Constitutional: Pt appears in NAD HENT: Head: NCAT.  Right Ear: External ear normal.  Left Ear: External ear normal.  Eyes: . Pupils are equal, round, and reactive to light. Conjunctivae and EOM are normal Nose: without d/c or deformity Neck: Neck supple. Gross normal ROM Cardiovascular: Normal rate and regular rhythm.   Pulmonary/Chest: Effort normal and breath sounds without rales or wheezing.  Abd:  Soft, NT, ND, + BS, no organomegaly Neurological: Pt is alert. At baseline orientation, motor grossly intact Skin: Skin is warm. No rashes, other new lesions, no LE edema Psychiatric: Pt behavior is normal without agitation  All otherwise neg per pt Lab  Results  Component Value Date   WBC 9.1 11/22/2019   HGB 12.2 (L) 11/22/2019   HCT 36.5 (L) 11/22/2019   PLT 255 11/22/2019   GLUCOSE 96 11/22/2019   CHOL 212 (H) 11/22/2019   TRIG 182 (H) 11/22/2019   HDL 43 11/22/2019   LDLDIRECT 68.0 08/29/2017   LDLCALC 137 (H) 11/22/2019   ALT 9 11/22/2019   AST 16 11/22/2019   NA 140 11/22/2019   K 5.2 11/22/2019   CL 104 11/22/2019   CREATININE 2.09 (H) 11/22/2019   BUN 34 (H) 11/22/2019   CO2 29 11/22/2019   TSH 3.86 11/22/2019   PSA 3.94 03/12/2014   HGBA1C 6.0 (H) 11/22/2019      Assessment & Plan:

## 2019-11-23 ENCOUNTER — Encounter: Payer: Self-pay | Admitting: Internal Medicine

## 2019-11-23 LAB — VITAMIN D 25 HYDROXY (VIT D DEFICIENCY, FRACTURES): Vit D, 25-Hydroxy: 40 ng/mL (ref 30–100)

## 2019-11-23 LAB — CK: Total CK: 77 U/L (ref 44–196)

## 2019-11-23 LAB — URINALYSIS, ROUTINE W REFLEX MICROSCOPIC
Bacteria, UA: NONE SEEN /HPF
Bilirubin Urine: NEGATIVE
Glucose, UA: NEGATIVE
Hgb urine dipstick: NEGATIVE
Hyaline Cast: NONE SEEN /LPF
Ketones, ur: NEGATIVE
Leukocytes,Ua: NEGATIVE
Nitrite: NEGATIVE
RBC / HPF: NONE SEEN /HPF (ref 0–2)
Specific Gravity, Urine: 1.017 (ref 1.001–1.03)
Squamous Epithelial / HPF: NONE SEEN /HPF (ref <=5)
WBC, UA: NONE SEEN /HPF (ref 0–5)
pH: 6 (ref 5.0–8.0)

## 2019-11-23 LAB — VITAMIN B12: Vitamin B-12: 664 pg/mL (ref 200–1100)

## 2019-11-23 LAB — LIPID PANEL
Cholesterol: 212 mg/dL — ABNORMAL HIGH (ref <200)
HDL: 43 mg/dL (ref >=40)
LDL Cholesterol (Calc): 137 mg/dL (calc) — ABNORMAL HIGH
Non-HDL Cholesterol (Calc): 169 mg/dL (calc) — ABNORMAL HIGH (ref <130)
Total CHOL/HDL Ratio: 4.9 (calc) (ref <5.0)
Triglycerides: 182 mg/dL — ABNORMAL HIGH (ref <150)

## 2019-11-23 LAB — CBC WITH DIFFERENTIAL/PLATELET
Absolute Monocytes: 701 cells/uL (ref 200–950)
Basophils Absolute: 64 cells/uL (ref 0–200)
Basophils Relative: 0.7 %
Eosinophils Absolute: 191 cells/uL (ref 15–500)
Eosinophils Relative: 2.1 %
HCT: 36.5 % — ABNORMAL LOW (ref 38.5–50.0)
Hemoglobin: 12.2 g/dL — ABNORMAL LOW (ref 13.2–17.1)
Lymphs Abs: 1647 cells/uL (ref 850–3900)
MCH: 31.9 pg (ref 27.0–33.0)
MCHC: 33.4 g/dL (ref 32.0–36.0)
MCV: 95.5 fL (ref 80.0–100.0)
MPV: 10.5 fL (ref 7.5–12.5)
Monocytes Relative: 7.7 %
Neutro Abs: 6497 cells/uL (ref 1500–7800)
Neutrophils Relative %: 71.4 %
Platelets: 255 10*3/uL (ref 140–400)
RBC: 3.82 10*6/uL — ABNORMAL LOW (ref 4.20–5.80)
RDW: 11.6 % (ref 11.0–15.0)
Total Lymphocyte: 18.1 %
WBC: 9.1 10*3/uL (ref 3.8–10.8)

## 2019-11-23 LAB — HEMOGLOBIN A1C
Hgb A1c MFr Bld: 6 % of total Hgb — ABNORMAL HIGH (ref <5.7)
Mean Plasma Glucose: 126 (calc)
eAG (mmol/L): 7 (calc)

## 2019-11-23 LAB — TSH: TSH: 3.86 mIU/L (ref 0.40–4.50)

## 2019-11-23 NOTE — Assessment & Plan Note (Signed)
For PT eval

## 2019-11-23 NOTE — Assessment & Plan Note (Signed)

## 2019-11-23 NOTE — Assessment & Plan Note (Signed)
For esr, and refer sports med

## 2019-11-23 NOTE — Assessment & Plan Note (Signed)
stable overall by history and exam, recent data reviewed with pt, and pt to continue medical treatment as before,  to f/u any worsening symptoms or concerns  

## 2019-11-25 DIAGNOSIS — H524 Presbyopia: Secondary | ICD-10-CM | POA: Diagnosis not present

## 2019-11-25 DIAGNOSIS — Z961 Presence of intraocular lens: Secondary | ICD-10-CM | POA: Diagnosis not present

## 2019-11-25 DIAGNOSIS — H04123 Dry eye syndrome of bilateral lacrimal glands: Secondary | ICD-10-CM | POA: Diagnosis not present

## 2019-11-26 ENCOUNTER — Other Ambulatory Visit: Payer: Self-pay

## 2019-11-26 ENCOUNTER — Ambulatory Visit: Payer: PPO | Admitting: Family Medicine

## 2019-11-26 ENCOUNTER — Encounter: Payer: Self-pay | Admitting: Family Medicine

## 2019-11-26 ENCOUNTER — Ambulatory Visit: Payer: Self-pay

## 2019-11-26 VITALS — BP 124/76 | HR 80 | Ht 72.0 in | Wt 201.4 lb

## 2019-11-26 DIAGNOSIS — R296 Repeated falls: Secondary | ICD-10-CM | POA: Diagnosis not present

## 2019-11-26 DIAGNOSIS — M25511 Pain in right shoulder: Secondary | ICD-10-CM | POA: Diagnosis not present

## 2019-11-26 DIAGNOSIS — M5416 Radiculopathy, lumbar region: Secondary | ICD-10-CM

## 2019-11-26 DIAGNOSIS — M25512 Pain in left shoulder: Secondary | ICD-10-CM | POA: Diagnosis not present

## 2019-11-26 NOTE — Progress Notes (Signed)
Subjective:    CC: B shoulder pain, R>L and R calf  I, Molly Weber, LAT, ATC, am serving as scribe for Dr. [name].  HPI: Pt is a 86 y/o male presenting w/ c/o B shoulder pain, R >L, x 2-3 weeks w/ no known MOI.  Pain started in May.  He received his Covid shot in January and February of this year.  He is not sure if they are related.  He did not have pain for months after his Covid shot.    He locates his pain to his R lateral, superior shoulder  Radiating pain: No Neck pain: No Shoulder mechanical symptoms: No Aggravating factors: donning/doffing clothes; raising arm overhead Treatments tried: Tylenol; Aleve; baby Aspirin  R leg pain: Pain from the R lateral thigh radiating into his R lateral calf.  He states that he was seen a few years ago and was diagnosed w/ sciatica and had an "injection."  He denies any weakness or numbness/tingling in his R LE.  Pertinent review of Systems: No fevers or chills  Relevant historical information: COPD   Objective:    Vitals:   11/26/19 1042  BP: 124/76  Pulse: 80  SpO2: 96%   General: Well Developed, well nourished, and in no acute distress.   MSK: Right shoulder decreased muscle bulk bilaterally right equal to left. Nontender. Range of motion abduction 140 degrees external rotation full internal rotation lumbar spine. Intact strength abduction external and internal rotation.  Minimally positive Hawkins and Neer's test. Negative Yergason's and speeds test.  L-spine nontender midline.  Decreased range of motion to extension. Negative slump test bilaterally. Intact strength bilateral lower extremities.  Right leg normal-appearing nontender no palpable cords in calf.  Lab and Radiology Results Diagnostic Limited MSK Ultrasound of: Right shoulder Biceps tendon intact. Subscapularis tendon is intact with hyperechoic change distally. Supraspinatus tendon is intact with mild increase subacromial bursa  thickness. Infraspinatus tendon is intact. AC joint degenerative. Impression: Rotator cuff tendinitis and subacromial bursitis mildly   MRI summary L-spine dated April 2016 IMPRESSION: 1. Disc protrusion at L4-5 asymmetric to the right compressing the right L5 nerve in the lateral recess. 2. Small broad-based disc protrusion at L2-3 asymmetric to the right. 3. Small disc protrusion into the left lateral recess at L3-4. 4. Severe left and moderate right facet arthritis at L5-S1. Possible pars defects at L5.   Electronically Signed   By: Lorriane Shire M.D.   On: 07/13/2014 00:43  Images personally independently reviewed.  Agree with radiology most dominant finding is probable nerve compression at right L5  Impression and Recommendations:    Assessment and Plan: 86 y.o. male with right shoulder pain ongoing for a few months worsening recently.  However over the last few weeks patient notes his pain has been improving quite a bit.  Today's exam is largely benign.  Discussed options.  He would like to try some physical therapy first and Voltaren gel which is reasonable.  Check back in 4 to 6 weeks if not better would consider steroid injection.  Right leg pain.  Likely L5 radiculopathy.  Patient had similar episode 5 years ago.  MRI from 2016 supports current pain complaint.  Again a little bit of watchful waiting and physical therapy.  If not better would consider either gabapentin or epidural steroid injection.  Check back again in 4 to 6 weeks as above.   Orders Placed This Encounter  Procedures  . [place] LIMITED JOINT SPACE STRUCTURES UP BILAT(NO  LINKED CHARGES)    Order Specific Question:   Reason for Exam (SYMPTOM  OR DIAGNOSIS REQUIRED)    Answer:   B shoulder pain    Order Specific Question:   Preferred imaging location?    Answer:   [facility]  . Ambulatory referral to Physical Therapy    Referral Priority:   Routine    Referral Type:   Physical  Medicine    Referral Reason:   Specialty Services Required    Requested Specialty:   Physical Therapy    Number of Visits Requested:   1   No orders of the defined types were placed in this encounter.   Discussed warning signs or symptoms. Please see discharge instructions. Patient expresses understanding.   The above documentation has been reviewed and is accurate and complete [name], M.D.

## 2019-11-26 NOTE — Patient Instructions (Signed)
Thank you for coming in today. Plan for Physical therapy.  Can do injection into the shoulder if needed here in the office.  Can arrange for back injection for leg pain if needed. Let me know.   Recheck with me in about 1 month or so.  Return or contact me sooner if needed.

## 2019-12-19 ENCOUNTER — Other Ambulatory Visit: Payer: Self-pay | Admitting: Internal Medicine

## 2019-12-19 NOTE — Telephone Encounter (Signed)
Please refill as per office routine med refill policy (all routine meds refilled for 3 mo or monthly per pt preference up to one year from last visit, then month to month grace period for 3 mo, then further med refills will have to be denied)  

## 2019-12-23 DIAGNOSIS — Z961 Presence of intraocular lens: Secondary | ICD-10-CM | POA: Diagnosis not present

## 2019-12-23 DIAGNOSIS — H04123 Dry eye syndrome of bilateral lacrimal glands: Secondary | ICD-10-CM | POA: Diagnosis not present

## 2019-12-30 ENCOUNTER — Other Ambulatory Visit: Payer: Self-pay

## 2019-12-30 ENCOUNTER — Ambulatory Visit (INDEPENDENT_AMBULATORY_CARE_PROVIDER_SITE_OTHER): Payer: PPO

## 2019-12-30 ENCOUNTER — Encounter: Payer: Self-pay | Admitting: Family Medicine

## 2019-12-30 ENCOUNTER — Ambulatory Visit: Payer: PPO | Admitting: Family Medicine

## 2019-12-30 ENCOUNTER — Ambulatory Visit: Payer: Self-pay

## 2019-12-30 VITALS — BP 130/78 | HR 79 | Ht 72.0 in | Wt 201.0 lb

## 2019-12-30 DIAGNOSIS — M25511 Pain in right shoulder: Secondary | ICD-10-CM | POA: Diagnosis not present

## 2019-12-30 DIAGNOSIS — M25512 Pain in left shoulder: Secondary | ICD-10-CM | POA: Diagnosis not present

## 2019-12-30 NOTE — Patient Instructions (Signed)
Thank you for coming in today. Call or go to the ER if you develop a large red swollen joint with extreme pain or oozing puss.   Please get an Xray today before you leave  Recheck as needed.  If not better can do physical therapy.

## 2019-12-30 NOTE — Progress Notes (Signed)
I, [name], LAT, ATC, am serving as scribe for Dr. [name].  Stephen Wilkins is a 86 y.o. male who presents to [place] at [facility] today for f/u of B shoulder pain and LBP w/ radiating pain into his R LE.  He was last seen by Dr. Georgina Snell on 11/26/19 and was referred for PT at [facility].  Since his last visit, pt reports improved B shoulder pain but still has pain R>L.  He notes that his low back pain and R LE pain has resolved.  He did not go to PT.  He con't to have pain at night when he's in bed but notes that his shoulder pain resolves once he gets up and moves around.  He has been taking 2 IBU/day.   Pertinent review of systems: No fevers or chills  Relevant historical information: Hypertension, COPD, hiatal hernia   Exam:  BP 130/78 (BP Location: Right Arm, Patient Position: Sitting, Cuff Size: Large)   Pulse 79   Ht 6' (1.829 m)   Wt 201 lb (91.2 kg)   SpO2 96%   BMI 27.26 kg/m  General: Well Developed, well nourished, and in no acute distress.   MSK: Right shoulder normal-appearing Range of motion abduction 140 degrees.  External rotation full internal rotation lumbar spine.  Intact strength.  Mildly positive Hawkins and Neer's test.  Positive empty can test. Negative Yergason's and speeds test.    Lab and Radiology Results  X-ray images right shoulder obtained today personally and independently interpreted No significant glenohumeral DJD.  Mild to moderate AC DJD.  No fracture or malalignment. Await formal radiology review  Procedure: Real-time Ultrasound Guided Injection of right shoulder subacromial bursa Device: Philips Affiniti 50G Images permanently stored and available for review in PACS Verbal informed consent obtained.  Discussed risks and benefits of procedure. Warned about infection bleeding damage to structures skin hypopigmentation and fat atrophy among others. Patient expresses understanding and agreement Time-out conducted.    Noted no overlying erythema, induration, or other signs of local infection.   Skin prepped in a sterile fashion.   Local anesthesia: Topical Ethyl chloride.   With sterile technique and under real time ultrasound guidance:  40 mg of Kenalog and 2 mL of Marcaine injected into subacromial bursa sac. Fluid seen entering the bursa.   Completed without difficulty   Pain partially resolved suggesting accurate placement of the medication.   Advised to call if fevers/chills, erythema, induration, drainage, or persistent bleeding.   Images permanently stored and available for review in the ultrasound unit.  Impression: Technically successful ultrasound guided injection.       Assessment and Plan: 86 y.o. male with right shoulder pain due to subacromial bursitis or impingement.  Plan to proceed with injection as above today.  Patient already had some improvement.  Plan to continue home exercise program.  Consider repeat order for physical therapy in the future if needed.  Recheck back as needed.   PDMP not reviewed this encounter. Orders Placed This Encounter  Procedures  . DG Shoulder Right    Standing Status:   Future    Number of Occurrences:   1    Standing Expiration Date:   12/29/2020    Order Specific Question:   Reason for Exam (SYMPTOM  OR DIAGNOSIS REQUIRED)    Answer:   eval shoulder pain    Order Specific Question:   Preferred imaging location?    Answer:   [facility]  Order Specific Question:   Radiology Contrast Protocol - do NOT remove file path    Answer:   [email or web address]  . [place] LIMITED JOINT SPACE STRUCTURES UP RIGHT(NO LINKED CHARGES)    Order Specific Question:   Reason for Exam (SYMPTOM  OR DIAGNOSIS REQUIRED)    Answer:   eval shoulder    Order Specific Question:   Preferred imaging location?    Answer:   [place]   No orders of the defined types were placed in this  encounter.    Discussed warning signs or symptoms. Please see discharge instructions. Patient expresses understanding.   The above documentation has been reviewed and is accurate and complete [name], M.D.

## 2019-12-31 NOTE — Progress Notes (Signed)
X-ray right shoulder normal-appearing to radiology

## 2020-01-07 DIAGNOSIS — Z23 Encounter for immunization: Secondary | ICD-10-CM

## 2020-01-08 ENCOUNTER — Other Ambulatory Visit (INDEPENDENT_AMBULATORY_CARE_PROVIDER_SITE_OTHER): Payer: PPO

## 2020-01-08 DIAGNOSIS — Z23 Encounter for immunization: Secondary | ICD-10-CM

## 2020-01-13 ENCOUNTER — Other Ambulatory Visit: Payer: Self-pay

## 2020-01-13 ENCOUNTER — Ambulatory Visit: Payer: PPO | Admitting: Family Medicine

## 2020-01-13 ENCOUNTER — Encounter: Payer: Self-pay | Admitting: Family Medicine

## 2020-01-13 ENCOUNTER — Ambulatory Visit (INDEPENDENT_AMBULATORY_CARE_PROVIDER_SITE_OTHER): Payer: PPO

## 2020-01-13 VITALS — BP 118/80 | HR 86 | Ht 72.0 in | Wt 201.0 lb

## 2020-01-13 DIAGNOSIS — M25512 Pain in left shoulder: Secondary | ICD-10-CM

## 2020-01-13 DIAGNOSIS — R296 Repeated falls: Secondary | ICD-10-CM

## 2020-01-13 MED ORDER — TRAMADOL HCL 50 MG PO TABS: 50.0000 mg | ORAL_TABLET | Freq: Three times a day (TID) | ORAL | 0 refills | Status: DC | PRN

## 2020-01-13 NOTE — Patient Instructions (Addendum)
Thank you for coming in today.  Plan for PT.  Use the sling as needed but start range of motion exercises.   Use tramadol sparingly.  Take with laxative to avoid constipation.   Recheck with me in 3-4 weeks.  Let me know sooner if this is not working.

## 2020-01-13 NOTE — Progress Notes (Signed)
Stephen Wilkins, am serving as a Education administrator for Dr. [name].  Stephen Wilkins is a 86 y.o. male who presents to [place] at [facility] today for a fall that has re-injured shoulder. Patient was last seen by Dr. Georgina Snell on 12/30/2019 for bilateral shoulder pain. right shoulder pain due to subacromial bursitis or impingement. Plan to proceed with injection as above today.  Patient already had some improvement.  Plan to continue home exercise program.  Consider repeat order for physical therapy in the future if needed.  Recheck back as needed. Patient fell in kitchen on left side when his foot twisted. Patient states the pain is located to front of shoulder and hurts with any movement. Patient states he fell on Friday, but did not want to go to the hospital. Patient is talking tylenol, ibuprofen, and ice as well as wearing a sling.  Since last visit patient reports,   Pertinent review of systems: No fevers or chills  Relevant historical information: Hypertension, COPD   Exam:  BP 118/80 (BP Location: Left Arm, Patient Position: Sitting, Cuff Size: Normal)    Pulse 86    Ht 6' (1.829 m)    Wt 201 lb (91.2 kg)    SpO2 99%    BMI 27.26 kg/m  General: Well Developed, well nourished, and in no acute distress.   MSK: Left shoulder normal-appearing Tender palpation anterior chest wall otherwise nontender. Range of motion abduction full external rotation full internal rotation lumbar spine. Strength 4+/5 abduction 4+/5 external rotation 5/5 internal rotation. Pulses cap refill and sensation are intact distally.    Lab and Radiology Results  X-ray images left shoulder obtained today personally and independently interpreted. No fractures visible in shoulder.  No obvious rib fractures visible. Await formal radiology review    Assessment and Plan: 86 y.o. male with left shoulder pain after fall.  Likely rotator cuff strain or injury.  Doubtful for significant full-thickness  rotator cuff tear given reasonably intact strength.  We discussed patient states that he probably would not have surgery if he did have a rotator cuff.  Plan for conservative management with physical therapy.  Limited tramadol.  Recheck in 3 weeks or so.  Return sooner if needed.  Reviewed fall risk and medication safety.  He is having severe pain and I have prescribed some limited tramadol.  Tramadol will increase his fall risk but he is having considerable pain and not treated with Tylenol.   PDMP reviewed during this encounter. Orders Placed This Encounter  Procedures   DG Shoulder Left    Standing Status:   Future    Number of Occurrences:   1    Standing Expiration Date:   01/12/2021    Order Specific Question:   Reason for Exam (SYMPTOM  OR DIAGNOSIS REQUIRED)    Answer:   eval fx    Order Specific Question:   Preferred imaging location?    Answer:   [facility]   Ambulatory referral to Physical Therapy    Referral Priority:   Routine    Referral Type:   Physical Medicine    Referral Reason:   Specialty Services Required    Requested Specialty:   Physical Therapy    Number of Visits Requested:   1   Meds ordered this encounter  Medications   traMADol (ULTRAM) 50 MG tablet    Sig: Take 1 tablet (50 mg total) by mouth every 8 (eight) hours as needed for severe pain.  Dispense:  15 tablet    Refill:  0     Discussed warning signs or symptoms. Please see discharge instructions. Patient expresses understanding.   The above documentation has been reviewed and is accurate and complete [name], M.D.

## 2020-01-15 NOTE — Progress Notes (Signed)
X-ray left shoulder shows a little bit of arthritis of the small joint of the top of the shoulder otherwise looks normal to radiology.

## 2020-01-23 DIAGNOSIS — M25512 Pain in left shoulder: Secondary | ICD-10-CM | POA: Diagnosis not present

## 2020-01-28 DIAGNOSIS — H04123 Dry eye syndrome of bilateral lacrimal glands: Secondary | ICD-10-CM | POA: Diagnosis not present

## 2020-02-04 ENCOUNTER — Encounter: Payer: Self-pay | Admitting: Family Medicine

## 2020-02-04 ENCOUNTER — Other Ambulatory Visit: Payer: Self-pay

## 2020-02-04 ENCOUNTER — Ambulatory Visit: Payer: PPO | Admitting: Family Medicine

## 2020-02-04 ENCOUNTER — Other Ambulatory Visit: Payer: Self-pay | Admitting: Internal Medicine

## 2020-02-04 ENCOUNTER — Ambulatory Visit: Payer: Self-pay

## 2020-02-04 ENCOUNTER — Telehealth: Payer: Self-pay | Admitting: Internal Medicine

## 2020-02-04 VITALS — BP 110/74 | HR 88 | Ht 72.0 in | Wt 199.8 lb

## 2020-02-04 DIAGNOSIS — M25512 Pain in left shoulder: Secondary | ICD-10-CM

## 2020-02-04 DIAGNOSIS — M542 Cervicalgia: Secondary | ICD-10-CM

## 2020-02-04 DIAGNOSIS — M25511 Pain in right shoulder: Secondary | ICD-10-CM | POA: Diagnosis not present

## 2020-02-04 MED ORDER — ALBUTEROL SULFATE HFA 108 (90 BASE) MCG/ACT IN AERS: 2.0000 | INHALATION_SPRAY | Freq: Four times a day (QID) | RESPIRATORY_TRACT | 11 refills | Status: DC | PRN

## 2020-02-04 NOTE — Progress Notes (Signed)
I, [name], LAT, ATC, am serving as scribe for Dr. [name].  Stephen Wilkins is a 86 y.o. male who presents to [place] at [facility] today for f/u of L shoulder pain after re-injuring his L shoulder when he fell in the kitchen.  He was last seen by Dr. Georgina Snell on 01/13/20 and was advised to wean out of his sling and to use Tramadol for pain.  He was also referred to PT.  Since his last visit, pt reports that his L shoulder is improving but now notes that he is having pain in his neck and into his R shoulder.  He attended one PT session ([facility]) and was d/c-ed.  He was shown a HEP and has been doing those intermittently.  Diagnostic imaging: L shoulder XR- 01/13/20  Pertinent review of systems: No fevers or chills  Relevant historical information: COPD   Exam:  BP 110/74 (BP Location: Right Arm, Patient Position: Sitting, Cuff Size: Large)   Pulse 88   Ht 6' (1.829 m)   Wt 199 lb 12.8 oz (90.6 kg)   SpO2 96%   BMI 27.10 kg/m  General: Well Developed, well nourished, and in no acute distress.   MSK: C-spine normal-appearing nontender midline.  Tender palpation paraspinal musculature and trapezius.  Decreased cervical motion. Upper extremity strength intact throughout except noted below. Reflexes and sensation intact distally. Left shoulder normal-appearing Nontender. Range of motion full abduction internal rotation lumbar spine X rotation full.  Some pain with abduction. Strength abduction 4+/5.  External rotation and internal rotation full. Positive Hawkins and Neer's test.    Lab and Radiology Results EXAM: LEFT SHOULDER - 2+ VIEW  COMPARISON:  None.  FINDINGS: There is no acute bony or joint abnormality. Mild acromioclavicular osteoarthritis noted. Imaged lung parenchyma clear.  IMPRESSION: No acute abnormality.   Electronically Signed   By: Inge Rise M.D.   On: 01/14/2020 15:54 I, [name], personally  (independently) visualized and performed the interpretation of the images attached in this note.  Procedure: Real-time Ultrasound Guided Injection of left shoulder subacromial bursa Device: Philips Affiniti 50G Images permanently stored and available for review in PACS Verbal informed consent obtained.  Discussed risks and benefits of procedure. Warned about infection bleeding damage to structures skin hypopigmentation and fat atrophy among others. Patient expresses understanding and agreement Time-out conducted.   Noted no overlying erythema, induration, or other signs of local infection.   Skin prepped in a sterile fashion.   Local anesthesia: Topical Ethyl chloride.   With sterile technique and under real time ultrasound guidance:  40 mg of Kenalog and 2 mL of Marcaine injected into subacromial bursa. Fluid seen entering the bursa.   Completed without difficulty   Pain immediately resolved suggesting accurate placement of the medication.   Advised to call if fevers/chills, erythema, induration, drainage, or persistent bleeding.   Images permanently stored and available for review in the ultrasound unit.  Impression: Technically successful ultrasound guided injection.        Assessment and Plan: 86 y.o. male with left shoulder pain multifactorial.  Some of the pain is trapezius and paraspinal musculature cervical spine dysfunction.  Some of the pain is also rotator cuff related.  He should benefit significantly from physical therapy. However we will proceed with subacromial injection today as it should help the rotator cuff related pain. Recheck back in about 6 weeks.  Return sooner if needed.  We will switch physical therapy locations.  I  did not intend for Kayl to have 1 session with PT and home exercise program.  He will very likely need ongoing physical therapy.  Refer to [facility] [employer] I can communicate more easily with the physical therapist.   [place] not reviewed this  encounter. Orders Placed This Encounter  Procedures  . [place] LIMITED JOINT SPACE STRUCTURES UP BILAT(NO LINKED CHARGES)    Order Specific Question:   Reason for Exam (SYMPTOM  OR DIAGNOSIS REQUIRED)    Answer:   B shoulder pain    Order Specific Question:   Preferred imaging location?    Answer:   [place]  . Ambulatory referral to Physical Therapy    Referral Priority:   Routine    Referral Type:   Physical Medicine    Referral Reason:   Specialty Services Required    Requested Specialty:   Physical Therapy   No orders of the defined types were placed in this encounter.    Discussed warning signs or symptoms. Please see discharge instructions. Patient expresses understanding.   The above documentation has been reviewed and is accurate and complete [name], M.D.

## 2020-02-04 NOTE — Telephone Encounter (Signed)
Patients wife is requesting a med refill for albuterol (PROVENTIL HFA;VENTOLIN HFA) 108 (90 Base) MCG/ACT inhaler  It is currently discontinued from patients med list  It can be sent to [id number] - [place], [place]

## 2020-02-04 NOTE — Telephone Encounter (Signed)
Ok this is done 

## 2020-02-04 NOTE — Patient Instructions (Signed)
Thank you for coming in today.  Plan for PT at [facility].  Recheck in about 6 weeks.  Return sooner if not doing well.   Call or go to the ER if you develop a large red swollen joint with extreme pain or oozing puss.

## 2020-02-04 NOTE — Telephone Encounter (Signed)
Please refill as per office routine med refill policy (all routine meds refilled for 3 mo or monthly per pt preference up to one year from last visit, then month to month grace period for 3 mo, then further med refills will have to be denied)  

## 2020-02-04 NOTE — Telephone Encounter (Signed)
Sent to Dr. John. 

## 2020-02-06 NOTE — Telephone Encounter (Signed)
    Spouse calling to report albuterol (VENTOLIN HFA) 108 (90 Base) MCG/ACT inhaler too costly ($77)

## 2020-02-07 MED ORDER — ALBUTEROL SULFATE HFA 108 (90 BASE) MCG/ACT IN AERS: 2.0000 | INHALATION_SPRAY | Freq: Four times a day (QID) | RESPIRATORY_TRACT | 3 refills | Status: DC | PRN

## 2020-02-07 NOTE — Telephone Encounter (Signed)
Sent to Dr. John. 

## 2020-02-07 NOTE — Addendum Note (Signed)
Addended by: Biagio Borg on: 02/07/2020 09:58 PM   Modules accepted: Orders

## 2020-02-07 NOTE — Telephone Encounter (Signed)
I resent rx for any generic

## 2020-02-12 ENCOUNTER — Encounter: Payer: Self-pay | Admitting: Physical Therapy

## 2020-02-12 ENCOUNTER — Ambulatory Visit: Payer: PPO | Attending: Family Medicine | Admitting: Physical Therapy

## 2020-02-12 ENCOUNTER — Other Ambulatory Visit: Payer: Self-pay

## 2020-02-12 DIAGNOSIS — M25612 Stiffness of left shoulder, not elsewhere classified: Secondary | ICD-10-CM | POA: Insufficient documentation

## 2020-02-12 DIAGNOSIS — M25512 Pain in left shoulder: Secondary | ICD-10-CM | POA: Insufficient documentation

## 2020-02-12 DIAGNOSIS — M25511 Pain in right shoulder: Secondary | ICD-10-CM | POA: Insufficient documentation

## 2020-02-12 DIAGNOSIS — R252 Cramp and spasm: Secondary | ICD-10-CM | POA: Insufficient documentation

## 2020-02-12 NOTE — Therapy (Signed)
[address] [place], [place], [address] Phone: [phone]   Fax:  [phone]  Physical Therapy Evaluation  Patient Details  Name: Stephen Wilkins MRN: [id number] Date of Birth: [birth year] Referring Provider (PT): Georgina Snell   Encounter Date: 02/12/2020   PT End of Session - 02/12/20 1339    Visit Number 1    Date for PT Re-Evaluation 04/13/20    PT Start Time 1253    PT Stop Time 1331    PT Time Calculation (min) 38 min    Activity Tolerance Patient tolerated treatment well    Behavior During Therapy [facility] for tasks assessed/performed           Past Medical History:  Diagnosis Date   ABNORMAL ELECTROCARDIOGRAM 06/21/2007   ALLERGIC RHINITIS 03/23/2007   Anxiety state, unspecified 09/06/2013   ASTHMATIC BRONCHITIS, ACUTE 04/27/2007   BENIGN PROSTATIC HYPERTROPHY 03/23/2007   BRONCHITIS NOT SPECIFIED AS ACUTE OR CHRONIC 04/21/2007   BURSITIS, RIGHT HIP 07/31/2008   COPD (chronic obstructive pulmonary disease) ([place]) 04/19/2016   Cough 01/29/2009   Dementia ([place]) 09/01/2010   ERECTILE DYSFUNCTION 03/23/2007   ERECTILE DYSFUNCTION, ORGANIC 04/17/2009   Esophageal stricture    FREQUENCY, URINARY 04/26/2010   GERD 03/23/2007   HIATAL HERNIA    HYPERLIPIDEMIA 03/23/2007   HYPERTENSION 03/23/2007   MILD COGNITIVE IMPAIRMENT SO STATED 05/19/2010   NEPHROLITHIASIS, HX OF 03/23/2007   PSA, INCREASED 06/27/2008   RASH-NONVESICULAR 03/23/2007   REACTIVE AIRWAY DISEASE 01/14/2010   SCHATZKI'S RING    SCIATICA, RIGHT 04/28/2008   Unspecified hypothyroidism 09/06/2013   Wheezing 04/17/2009    Past Surgical History:  Procedure Laterality Date   ERCP N/A 12/28/2018   Procedure: ENDOSCOPIC RETROGRADE CHOLANGIOPANCREATOGRAPHY (ERCP);  Surgeon: Ladene Artist, MD;  Location: [facility];  Service: Endoscopy;  Laterality: N/A;   REMOVAL OF STONES  12/28/2018   Procedure: REMOVAL OF STONES;  Surgeon:  Ladene Artist, MD;  Location: [facility];  Service: Endoscopy;;  balloon sweep   SPHINCTEROTOMY  12/28/2018   Procedure: Joan Mayans;  Surgeon: Ladene Artist, MD;  Location: [facility];  Service: Endoscopy;;   TONSILLECTOMY      There were no vitals filed for this visit.    Subjective Assessment - 02/12/20 1254    Subjective Pt reports fall on L side six weeks ago which resulted in L shoulder pain. Pt states that since the fall L shoulder pain is getting a little better but is now having R shoulder pain and neck pain now. Pt states that gel has been helping with sleep. Pt reports that he has been having trouble pulling shirt over head and reaching behind back, slowly improving. Pt denies N/T, radiating pain, and new HA.    Pertinent History asthma, COPD    Diagnostic tests xrays    Patient Stated Goals reduce pain    Currently in Pain? Yes    Pain Score 5     Pain Location Shoulder   states a little nagging pain in neck and R shoulder   Pain Orientation Left    Pain Descriptors / Indicators Sharp;Dull;Aching    Pain Type Acute pain    Pain Onset More than a month ago    Pain Frequency Intermittent    Aggravating Factors  pulling with L arm, reaching overhead    Pain Relieving Factors rest, gel              [facility] PT Assessment - 02/12/20 0001  Assessment   Medical Diagnosis B shoulder pain and neck pain    Referring Provider (PT) Georgina Snell    Hand Dominance Right    Next MD Visit 03/09/2020      Precautions   Precautions None      Restrictions   Weight Bearing Restrictions No      Balance Screen   Has the patient fallen in the past 6 months Yes    How many times? 1    Has the patient had a decrease in activity level because of a fear of falling?  Yes    Is the patient reluctant to leave their home because of a fear of falling?  No      Home Environment   Additional Comments some housework      Prior Function   Level of Independence Independent     Vocation Retired      Art therapist   Posture/Postural Control Postural limitations    Postural Limitations Rounded Shoulders;Forward head      ROM / Strength   AROM / PROM / Strength AROM;PROM;Strength      AROM   Overall AROM Comments some pain at end ranges of shoulder ROM    AROM Assessment Site Shoulder;Cervical    Right/Left Shoulder Right;Left    Right Shoulder Flexion 160 Degrees    Right Shoulder ABduction 160 Degrees    Right Shoulder Internal Rotation --   T12   Right Shoulder External Rotation --   to C7   Left Shoulder Flexion 160 Degrees    Left Shoulder ABduction 160 Degrees    Left Shoulder Internal Rotation --   to C7   Left Shoulder External Rotation --   T10   Cervical Flexion 35    Cervical Extension 30    Cervical - Right Side Bend 40    Cervical - Left Side Bend 40    Cervical - Right Rotation 55    Cervical - Left Rotation 55      Strength   Overall Strength Comments B shoulder strength WFL; weakness of scap stabilizers      Palpation   Palpation comment mild tenderness to palpation L UT and L biceps tendon                      Objective measurements completed on examination: See above findings.       [facility] Adult PT Treatment/Exercise - 02/12/20 0001      Exercises   Exercises Shoulder      Shoulder Exercises: Standing   External Rotation Both;10 reps    Theraband Level (Shoulder External Rotation) Level 2 (Red)    Row Both;10 reps    Theraband Level (Shoulder Row) Level 2 (Red)      Shoulder Exercises: Stretch   Internal Rotation Stretch 5 reps    Internal Rotation Stretch Limitations with towel    Other Shoulder Stretches UT stretch, cervical flex/ext ROM                  PT Education - 02/12/20 1339    Education Details Pt educated on POC and HEP    Person(s) Educated Patient    Methods Explanation;Demonstration;Handout    Comprehension Verbalized understanding;Returned demonstration             PT Short Term Goals - 02/12/20 1350      PT SHORT TERM GOAL #1   Title Pt will be I with HEP    Time 2  Period Weeks    Status New    Target Date 02/26/20             PT Long Term Goals - 02/12/20 1350      PT LONG TERM GOAL #1   Title Pt will be I with advanced HEP    Time 6    Period Weeks    Status New    Target Date 03/25/20      PT LONG TERM GOAL #2   Title Pt will report 50% reduced shoulder pain    Time 6    Period Weeks    Status New    Target Date 03/25/20      PT LONG TERM GOAL #3   Title Pt will report no pain with end range L shoulder ER/IR    Time 6    Status New    Target Date 03/25/20                  Plan - 02/12/20 1339    Clinical Impression Statement Pt presents to clinic with reports of B shoulder pain L>R and neck pain following a fall ~6 weeks ago. Pain was initially localized to L shoulder and pt reports he has begun experiencing R shoulder and neck pain. Xrays show mild arthritis, no acute bony abnormalities. Pt demos strength deficit B shoulder IR/ER, cervical AROM deficits, pain with end range shoulder ER/IR B L>R, tenderness L shoulder/UT, and reports of decreased functional use of LUE esp with lifting/reaching overhead. Pt would benefit from skilled PT to address the above impairments.    Personal Factors and Comorbidities Comorbidity 2    Comorbidities asthma, HTN    Examination-Activity Limitations Lift;Reach Overhead    Examination-Participation Restrictions Community Activity;Interpersonal Relationship    Stability/Clinical Decision Making Stable/Uncomplicated    Clinical Decision Making Low    Rehab Potential Good    PT Frequency 1x / week    PT Duration 6 weeks    PT Treatment/Interventions ADLs/Self Care Home Management;Electrical Stimulation;Iontophoresis 4mg /ml Dexamethasone;Moist Heat;Neuromuscular re-education;Therapeutic exercise;Therapeutic activities;Patient/family education;Manual techniques;Passive range of  motion    PT Next Visit Plan scap stab ex's, UE/cervical flexibility/strength, manual/modalities as needed. Progress HEP as indicated    PT Home Exercise Plan UT stretch, cervical flex/ext, rows with TB, ER with TB    Consulted and Agree with Plan of Care Patient           Patient will benefit from skilled therapeutic intervention in order to improve the following deficits and impairments:  Decreased range of motion, Impaired UE functional use, Increased muscle spasms, Pain, Impaired flexibility, Decreased strength, Postural dysfunction  Visit Diagnosis: Acute pain of left shoulder  Acute pain of right shoulder  Stiffness of left shoulder, not elsewhere classified  Cramp and spasm     Problem List Patient Active Problem List   Diagnosis Date Noted   Bilateral shoulder pain 11/22/2019   Myalgia 11/22/2019   Gait disorder 11/22/2019   Febrile illness 12/27/2018   Choledocholithiasis 12/27/2018   Low back pain 07/11/2017   Non compliance w medication regimen 08/23/2016   Peripheral neuropathy 08/23/2016   COPD (chronic obstructive pulmonary disease) ([place]) 04/19/2016   Hyperglycemia 02/10/2016   Preventative health care 07/29/2015   CAP (community acquired pneumonia) 05/30/2015   Abnormal CXR 05/30/2015   Cough 04/22/2015   Asthma with exacerbation 04/22/2015   Urine abnormality 04/22/2015   Abdominal pain 09/11/2014   Abnormal liver function test 09/11/2014   Dysphagia 07/29/2014   Anxiety  state 09/06/2013   CKD (chronic kidney disease) 09/06/2013   Hypothyroidism 09/06/2013   Rash and nonspecific skin eruption 02/27/2013   Dizziness and giddiness 01/04/2013   Abnormal TSH 06/04/2012   Gross hematuria 01/09/2012   Bruising 06/05/2011   Dementia ([place]) 09/01/2010   Cough variant asthma vs UACS  01/14/2010   ERECTILE DYSFUNCTION, ORGANIC 04/17/2009   FATIGUE 06/27/2008   PSA, INCREASED 06/27/2008   SCIATICA, RIGHT 04/28/2008    SCHATZKI'S RING 07/18/2007   HIATAL HERNIA 07/18/2007   ABNORMAL ELECTROCARDIOGRAM 06/21/2007   Hyperlipidemia 03/23/2007   Essential hypertension 03/23/2007   Allergic rhinitis 03/23/2007   GERD 03/23/2007   BENIGN PROSTATIC HYPERTROPHY 03/23/2007   NEPHROLITHIASIS, HX OF 03/23/2007   Amador Cunas, PT, DPT Donald Prose Horald Birky 02/12/2020, 1:55 PM  [address]. [place], [place], [address] Phone: [phone]   Fax:  [phone]  Name: Stephen Wilkins MRN: [id number] Date of Birth: 11-Jul-1927

## 2020-02-12 NOTE — Patient Instructions (Signed)
Access Code: [email or web address] URL: [email or web address] Date: 02/12/2020 Prepared by: Amador Cunas  Exercises Standing Shoulder Internal Rotation Stretch with Towel - 1 x daily - 7 x weekly - 3 sets - 10 reps - 5 sec hold Seated Cervical Extension AROM - 1 x daily - 7 x weekly - 3 sets - 10 reps Seated Upper Trapezius Stretch - 1 x daily - 7 x weekly - 3 sets - 10 reps Scapular Retraction with Resistance - 1 x daily - 7 x weekly - 3 sets - 10 reps Shoulder External Rotation and Scapular Retraction with Resistance - 1 x daily - 7 x weekly - 3 sets - 10 reps

## 2020-02-18 ENCOUNTER — Ambulatory Visit: Payer: PPO | Admitting: Physical Therapy

## 2020-02-18 ENCOUNTER — Other Ambulatory Visit: Payer: Self-pay

## 2020-02-18 ENCOUNTER — Encounter: Payer: Self-pay | Admitting: Physical Therapy

## 2020-02-18 DIAGNOSIS — M25512 Pain in left shoulder: Secondary | ICD-10-CM | POA: Diagnosis not present

## 2020-02-18 DIAGNOSIS — M25612 Stiffness of left shoulder, not elsewhere classified: Secondary | ICD-10-CM

## 2020-02-18 DIAGNOSIS — M25511 Pain in right shoulder: Secondary | ICD-10-CM

## 2020-02-18 NOTE — Therapy (Signed)
[place] [place], [place], [address] Phone: [phone]   Fax:  [phone]  Physical Therapy Treatment  Patient Details  Name: Stephen Wilkins MRN: [id number] Date of Birth: 1927-10-06 Referring Provider (PT): Marikay Alar Date: 02/18/2020   PT End of Session - 02/18/20 1422    Visit Number 2    Date for PT Re-Evaluation 04/13/20    PT Start Time 8182    PT Stop Time 1425    PT Time Calculation (min) 40 min    Activity Tolerance Patient tolerated treatment well    Behavior During Therapy [facility] for tasks assessed/performed           Past Medical History:  Diagnosis Date  . ABNORMAL ELECTROCARDIOGRAM 06/21/2007  . ALLERGIC RHINITIS 03/23/2007  . Anxiety state, unspecified 09/06/2013  . ASTHMATIC BRONCHITIS, ACUTE 04/27/2007  . BENIGN PROSTATIC HYPERTROPHY 03/23/2007  . BRONCHITIS NOT SPECIFIED AS ACUTE OR CHRONIC 04/21/2007  . BURSITIS, RIGHT HIP 07/31/2008  . COPD (chronic obstructive pulmonary disease) ([place]) 04/19/2016  . Cough 01/29/2009  . Dementia ([place]) 09/01/2010  . ERECTILE DYSFUNCTION 03/23/2007  . ERECTILE DYSFUNCTION, ORGANIC 04/17/2009  . Esophageal stricture   . FREQUENCY, URINARY 04/26/2010  . GERD 03/23/2007  . HIATAL HERNIA   . HYPERLIPIDEMIA 03/23/2007  . HYPERTENSION 03/23/2007  . MILD COGNITIVE IMPAIRMENT SO STATED 05/19/2010  . NEPHROLITHIASIS, HX OF 03/23/2007  . PSA, INCREASED 06/27/2008  . RASH-NONVESICULAR 03/23/2007  . REACTIVE AIRWAY DISEASE 01/14/2010  . SCHATZKI'S RING   . SCIATICA, RIGHT 04/28/2008  . Unspecified hypothyroidism 09/06/2013  . Wheezing 04/17/2009    Past Surgical History:  Procedure Laterality Date  . ERCP N/A 12/28/2018   Procedure: ENDOSCOPIC RETROGRADE CHOLANGIOPANCREATOGRAPHY (ERCP);  Surgeon: Ladene Artist, MD;  Location: [facility];  Service: Endoscopy;  Laterality: N/A;  . REMOVAL OF STONES  12/28/2018   Procedure: REMOVAL OF STONES;  Surgeon: Ladene Artist, MD;  Location: [facility];  Service: Endoscopy;;  balloon sweep  . SPHINCTEROTOMY  12/28/2018   Procedure: SPHINCTEROTOMY;  Surgeon: Ladene Artist, MD;  Location: [facility];  Service: Endoscopy;;  . TONSILLECTOMY      There were no vitals filed for this visit.   Subjective Assessment - 02/18/20 1345    Subjective Pt reports less pain overall in both shoulders.    Currently in Pain? No/denies                             [facility] Adult PT Treatment/Exercise - 02/18/20 0001      Shoulder Exercises: Standing   Horizontal ABduction Strengthening;Both;10 reps;Theraband    External Rotation Both;20 reps;Theraband;Strengthening    Theraband Level (Shoulder External Rotation) Level 2 (Red)    Flexion Strengthening;Both;Weights;10 reps    Shoulder Flexion Weight (lbs) 2    Extension Strengthening;Both;Theraband;15 reps;10 reps    Theraband Level (Shoulder Extension) Level 3 (Green)    Row 15 reps;10 reps;Both;Strengthening    Theraband Level (Shoulder Row) Level 3 (Green)    Other Standing Exercises Flex, Ext, IR up back AAROM x10       Shoulder Exercises: ROM/Strengthening   UBE (Upper Arm Bike) L1 x 2 min each    Nustep L4 x5 min       Manual Therapy   Manual Therapy Soft tissue mobilization    Manual therapy comments Levator stretch    Soft tissue mobilization Upper traps into cervical spine  PT Short Term Goals - 02/12/20 1350      PT SHORT TERM GOAL #1   Title Pt will be I with HEP    Time 2    Period Weeks    Status New    Target Date 02/26/20             PT Long Term Goals - 02/12/20 1350      PT LONG TERM GOAL #1   Title Pt will be I with advanced HEP    Time 6    Period Weeks    Status New    Target Date 03/25/20      PT LONG TERM GOAL #2   Title Pt will report 50% reduced shoulder pain    Time 6    Period Weeks    Status New    Target Date 03/25/20      PT LONG TERM GOAL #3   Title Pt  will report no pain with end range L shoulder ER/IR    Time 6    Status New    Target Date 03/25/20                 Plan - 02/18/20 1423    Clinical Impression Statement Pt tolerated an initial progression to TE well. Cues given throughout for pacing and to slow down with exercises. Tactile cues to chest given to maintain good posture with shoulder extensions. Tactile cues given to both elbows to keep arms to side with external rotation. TP in the upper L trap noted with MT. Positive response to MT.    Personal Factors and Comorbidities Comorbidity 2    Comorbidities asthma, HTN    Examination-Activity Limitations Lift;Reach Overhead    Examination-Participation Restrictions Community Activity;Interpersonal Relationship    Stability/Clinical Decision Making Stable/Uncomplicated    Rehab Potential Good    PT Frequency 1x / week    PT Duration 6 weeks    PT Treatment/Interventions ADLs/Self Care Home Management;Electrical Stimulation;Iontophoresis 4mg /ml Dexamethasone;Moist Heat;Neuromuscular re-education;Therapeutic exercise;Therapeutic activities;Patient/family education;Manual techniques;Passive range of motion    PT Next Visit Plan scap stab ex's, UE/cervical flexibility/strength, manual/modalities as needed.           Patient will benefit from skilled therapeutic intervention in order to improve the following deficits and impairments:  Decreased range of motion, Impaired UE functional use, Increased muscle spasms, Pain, Impaired flexibility, Decreased strength, Postural dysfunction  Visit Diagnosis: Stiffness of left shoulder, not elsewhere classified  Acute pain of right shoulder  Acute pain of left shoulder     Problem List Patient Active Problem List   Diagnosis Date Noted  . Bilateral shoulder pain 11/22/2019  . Myalgia 11/22/2019  . Gait disorder 11/22/2019  . Febrile illness 12/27/2018  . Choledocholithiasis 12/27/2018  . Low back pain 07/11/2017  . Non  compliance w medication regimen 08/23/2016  . Peripheral neuropathy 08/23/2016  . COPD (chronic obstructive pulmonary disease) ([place]) 04/19/2016  . Hyperglycemia 02/10/2016  . Preventative health care 07/29/2015  . CAP (community acquired pneumonia) 05/30/2015  . Abnormal CXR 05/30/2015  . Cough 04/22/2015  . Asthma with exacerbation 04/22/2015  . Urine abnormality 04/22/2015  . Abdominal pain 09/11/2014  . Abnormal liver function test 09/11/2014  . Dysphagia 07/29/2014  . Anxiety state 09/06/2013  . CKD (chronic kidney disease) 09/06/2013  . Hypothyroidism 09/06/2013  . Rash and nonspecific skin eruption 02/27/2013  . Dizziness and giddiness 01/04/2013  . Abnormal TSH 06/04/2012  . Gross hematuria 01/09/2012  . Bruising 06/05/2011  .  Dementia ([name]) 09/01/2010  . Cough variant asthma vs UACS  01/14/2010  . ERECTILE DYSFUNCTION, ORGANIC 04/17/2009  . FATIGUE 06/27/2008  . PSA, INCREASED 06/27/2008  . SCIATICA, RIGHT 04/28/2008  . SCHATZKI'S RING 07/18/2007  . HIATAL HERNIA 07/18/2007  . ABNORMAL ELECTROCARDIOGRAM 06/21/2007  . Hyperlipidemia 03/23/2007  . Essential hypertension 03/23/2007  . Allergic rhinitis 03/23/2007  . GERD 03/23/2007  . BENIGN PROSTATIC HYPERTROPHY 03/23/2007  . NEPHROLITHIASIS, HX OF 03/23/2007    Scot Jun 02/18/2020, 2:27 PM  [place] [place], [place], [address] Phone: [phone]   Fax:  [phone]  Name: Stephen Wilkins MRN: [id number] Date of Birth: 11-Dec-1927

## 2020-02-25 ENCOUNTER — Ambulatory Visit: Payer: PPO | Admitting: Physical Therapy

## 2020-02-25 ENCOUNTER — Encounter: Payer: Self-pay | Admitting: Physical Therapy

## 2020-02-25 ENCOUNTER — Other Ambulatory Visit: Payer: Self-pay

## 2020-02-25 DIAGNOSIS — M25612 Stiffness of left shoulder, not elsewhere classified: Secondary | ICD-10-CM

## 2020-02-25 DIAGNOSIS — M25512 Pain in left shoulder: Secondary | ICD-10-CM

## 2020-02-25 DIAGNOSIS — M25511 Pain in right shoulder: Secondary | ICD-10-CM

## 2020-02-25 DIAGNOSIS — R252 Cramp and spasm: Secondary | ICD-10-CM

## 2020-02-25 NOTE — Therapy (Signed)
[address]. [place], [place], [address] Phone: [phone]   Fax:  [phone]  Physical Therapy Treatment  Patient Details  Name: Stephen Wilkins MRN: [id number] Date of Birth: 1927/10/23 Referring Provider (PT): Georgina Snell   Encounter Date: 02/25/2020   PT End of Session - 02/25/20 1056    Visit Number 3    Date for PT Re-Evaluation 04/13/20    PT Start Time 7893    PT Stop Time 1057    PT Time Calculation (min) 34 min    Activity Tolerance Patient tolerated treatment well    Behavior During Therapy [facility] for tasks assessed/performed           Past Medical History:  Diagnosis Date  . ABNORMAL ELECTROCARDIOGRAM 06/21/2007  . ALLERGIC RHINITIS 03/23/2007  . Anxiety state, unspecified 09/06/2013  . ASTHMATIC BRONCHITIS, ACUTE 04/27/2007  . BENIGN PROSTATIC HYPERTROPHY 03/23/2007  . BRONCHITIS NOT SPECIFIED AS ACUTE OR CHRONIC 04/21/2007  . BURSITIS, RIGHT HIP 07/31/2008  . COPD (chronic obstructive pulmonary disease) ([place]) 04/19/2016  . Cough 01/29/2009  . Dementia ([place]) 09/01/2010  . ERECTILE DYSFUNCTION 03/23/2007  . ERECTILE DYSFUNCTION, ORGANIC 04/17/2009  . Esophageal stricture   . FREQUENCY, URINARY 04/26/2010  . GERD 03/23/2007  . HIATAL HERNIA   . HYPERLIPIDEMIA 03/23/2007  . HYPERTENSION 03/23/2007  . MILD COGNITIVE IMPAIRMENT SO STATED 05/19/2010  . NEPHROLITHIASIS, HX OF 03/23/2007  . PSA, INCREASED 06/27/2008  . RASH-NONVESICULAR 03/23/2007  . REACTIVE AIRWAY DISEASE 01/14/2010  . SCHATZKI'S RING   . SCIATICA, RIGHT 04/28/2008  . Unspecified hypothyroidism 09/06/2013  . Wheezing 04/17/2009    Past Surgical History:  Procedure Laterality Date  . ERCP N/A 12/28/2018   Procedure: ENDOSCOPIC RETROGRADE CHOLANGIOPANCREATOGRAPHY (ERCP);  Surgeon: Ladene Artist, MD;  Location: [facility];  Service: Endoscopy;  Laterality: N/A;  . REMOVAL OF STONES  12/28/2018   Procedure: REMOVAL OF STONES;  Surgeon: Ladene Artist, MD;  Location: [facility];  Service: Endoscopy;;  balloon sweep  . SPHINCTEROTOMY  12/28/2018   Procedure: SPHINCTEROTOMY;  Surgeon: Ladene Artist, MD;  Location: [facility];  Service: Endoscopy;;  . TONSILLECTOMY      There were no vitals filed for this visit.   Subjective Assessment - 02/25/20 1026    Subjective Pt reports no pain this morning    Currently in Pain? No/denies                             [facility] Adult PT Treatment/Exercise - 02/25/20 0001      Self-Care   Self-Care Other Self-Care Comments    Other Self-Care Comments  education on independent gym program      Shoulder Exercises: Standing   External Rotation Both;20 reps    Theraband Level (Shoulder External Rotation) Level 2 (Red)    Internal Rotation Both;20 reps    Theraband Level (Shoulder Internal Rotation) Level 2 (Red)    Extension Both;10 reps    Theraband Level (Shoulder Extension) Level 3 (Green)    Row Both;20 reps    Theraband Level (Shoulder Row) Level 3 (Green)    Other Standing Exercises Flex, Ext, IR up back AAROM x10     Other Standing Exercises shoulder abd/flex 2x10 2#; red scap stab 3 ways x5 B      Shoulder Exercises: ROM/Strengthening   UBE (Upper Arm Bike) L2 x 2 min each    Nustep L5 x 6 min  PT Short Term Goals - 02/25/20 1058      PT SHORT TERM GOAL #1   Title Pt will be I with HEP    Time 2    Period Weeks    Status Achieved    Target Date 02/26/20             PT Long Term Goals - 02/12/20 1350      PT LONG TERM GOAL #1   Title Pt will be I with advanced HEP    Time 6    Period Weeks    Status New    Target Date 03/25/20      PT LONG TERM GOAL #2   Title Pt will report 50% reduced shoulder pain    Time 6    Period Weeks    Status New    Target Date 03/25/20      PT LONG TERM GOAL #3   Title Pt will report no pain with end range L shoulder ER/IR    Time 6    Status New    Target Date 03/25/20                  Plan - 02/25/20 1057    Clinical Impression Statement Pt arrived to tx session ~8 min late this rx. Pt tolerated progression of ex's well with no complaints of shoulder/neck pain with any ex's. Pt required cuing for slow, eccentric control and posture/form especially with standing ex's. Pt expressed interest in independent gym program folowing d/c; follow up on this next rx.    PT Treatment/Interventions ADLs/Self Care Home Management;Electrical Stimulation;Iontophoresis 4mg /ml Dexamethasone;Moist Heat;Neuromuscular re-education;Therapeutic exercise;Therapeutic activities;Patient/family education;Manual techniques;Passive range of motion    PT Next Visit Plan scap stab ex's, UE/cervical flexibility/strength, manual/modalities as needed.    Consulted and Agree with Plan of Care Patient           Patient will benefit from skilled therapeutic intervention in order to improve the following deficits and impairments:  Decreased range of motion, Impaired UE functional use, Increased muscle spasms, Pain, Impaired flexibility, Decreased strength, Postural dysfunction  Visit Diagnosis: Stiffness of left shoulder, not elsewhere classified  Acute pain of right shoulder  Acute pain of left shoulder  Cramp and spasm     Problem List Patient Active Problem List   Diagnosis Date Noted  . Bilateral shoulder pain 11/22/2019  . Myalgia 11/22/2019  . Gait disorder 11/22/2019  . Febrile illness 12/27/2018  . Choledocholithiasis 12/27/2018  . Low back pain 07/11/2017  . Non compliance w medication regimen 08/23/2016  . Peripheral neuropathy 08/23/2016  . COPD (chronic obstructive pulmonary disease) ([place]) 04/19/2016  . Hyperglycemia 02/10/2016  . Preventative health care 07/29/2015  . CAP (community acquired pneumonia) 05/30/2015  . Abnormal CXR 05/30/2015  . Cough 04/22/2015  . Asthma with exacerbation 04/22/2015  . Urine abnormality 04/22/2015  . Abdominal pain  09/11/2014  . Abnormal liver function test 09/11/2014  . Dysphagia 07/29/2014  . Anxiety state 09/06/2013  . CKD (chronic kidney disease) 09/06/2013  . Hypothyroidism 09/06/2013  . Rash and nonspecific skin eruption 02/27/2013  . Dizziness and giddiness 01/04/2013  . Abnormal TSH 06/04/2012  . Gross hematuria 01/09/2012  . Bruising 06/05/2011  . Dementia ([place]) 09/01/2010  . Cough variant asthma vs UACS  01/14/2010  . ERECTILE DYSFUNCTION, ORGANIC 04/17/2009  . FATIGUE 06/27/2008  . PSA, INCREASED 06/27/2008  . SCIATICA, RIGHT 04/28/2008  . SCHATZKI'S RING 07/18/2007  . HIATAL HERNIA 07/18/2007  . ABNORMAL ELECTROCARDIOGRAM 06/21/2007  .  Hyperlipidemia 03/23/2007  . Essential hypertension 03/23/2007  . Allergic rhinitis 03/23/2007  . GERD 03/23/2007  . BENIGN PROSTATIC HYPERTROPHY 03/23/2007  . NEPHROLITHIASIS, HX OF 03/23/2007   Amador Cunas, PT, DPT Donald Prose Krystian Ferrentino 02/25/2020, 10:59 AM  [place]. [place], [place], [address] Phone: [phone]   Fax:  [phone]  Name: Stephen Wilkins MRN: [id number] Date of Birth: [birth year]

## 2020-03-03 ENCOUNTER — Encounter: Payer: Self-pay | Admitting: Physical Therapy

## 2020-03-03 ENCOUNTER — Ambulatory Visit: Payer: PPO | Admitting: Physical Therapy

## 2020-03-03 ENCOUNTER — Other Ambulatory Visit: Payer: Self-pay

## 2020-03-03 DIAGNOSIS — R252 Cramp and spasm: Secondary | ICD-10-CM

## 2020-03-03 DIAGNOSIS — M25612 Stiffness of left shoulder, not elsewhere classified: Secondary | ICD-10-CM

## 2020-03-03 DIAGNOSIS — M25511 Pain in right shoulder: Secondary | ICD-10-CM

## 2020-03-03 DIAGNOSIS — M25512 Pain in left shoulder: Secondary | ICD-10-CM | POA: Diagnosis not present

## 2020-03-03 NOTE — Therapy (Signed)
[place]. [place], [place], [address] Phone: [phone]   Fax:  [phone]  Physical Therapy Treatment  Patient Details  Name: Stephen Wilkins MRN: [id number] Date of Birth: [birth year] Referring Provider (PT): Georgina Snell   Encounter Date: 03/03/2020   PT End of Session - 03/03/20 1057    Visit Number 4    Date for PT Re-Evaluation 04/13/20    PT Start Time 6378    PT Stop Time 1059    PT Time Calculation (min) 43 min    Activity Tolerance Patient tolerated treatment well    Behavior During Therapy [facility] for tasks assessed/performed           Past Medical History:  Diagnosis Date   ABNORMAL ELECTROCARDIOGRAM 06/21/2007   ALLERGIC RHINITIS 03/23/2007   Anxiety state, unspecified 09/06/2013   ASTHMATIC BRONCHITIS, ACUTE 04/27/2007   BENIGN PROSTATIC HYPERTROPHY 03/23/2007   BRONCHITIS NOT SPECIFIED AS ACUTE OR CHRONIC 04/21/2007   BURSITIS, RIGHT HIP 07/31/2008   COPD (chronic obstructive pulmonary disease) ([place]) 04/19/2016   Cough 01/29/2009   Dementia ([place]) 09/01/2010   ERECTILE DYSFUNCTION 03/23/2007   ERECTILE DYSFUNCTION, ORGANIC 04/17/2009   Esophageal stricture    FREQUENCY, URINARY 04/26/2010   GERD 03/23/2007   HIATAL HERNIA    HYPERLIPIDEMIA 03/23/2007   HYPERTENSION 03/23/2007   MILD COGNITIVE IMPAIRMENT SO STATED 05/19/2010   NEPHROLITHIASIS, HX OF 03/23/2007   PSA, INCREASED 06/27/2008   RASH-NONVESICULAR 03/23/2007   REACTIVE AIRWAY DISEASE 01/14/2010   SCHATZKI'S RING    SCIATICA, RIGHT 04/28/2008   Unspecified hypothyroidism 09/06/2013   Wheezing 04/17/2009    Past Surgical History:  Procedure Laterality Date   ERCP N/A 12/28/2018   Procedure: ENDOSCOPIC RETROGRADE CHOLANGIOPANCREATOGRAPHY (ERCP);  Surgeon: Ladene Artist, MD;  Location: [facility];  Service: Endoscopy;  Laterality: N/A;   REMOVAL OF STONES  12/28/2018   Procedure: REMOVAL OF STONES;  Surgeon: Ladene Artist, MD;  Location: [facility];  Service: Endoscopy;;  balloon sweep   SPHINCTEROTOMY  12/28/2018   Procedure: Joan Mayans;  Surgeon: Ladene Artist, MD;  Location: [facility];  Service: Endoscopy;;   TONSILLECTOMY      There were no vitals filed for this visit.   Subjective Assessment - 03/03/20 1020    Subjective Reports shoulders a little better, reports worst is in the morning getting up    Currently in Pain? No/denies                             [facility] Adult PT Treatment/Exercise - 03/03/20 0001      Shoulder Exercises: Standing   External Rotation Both;20 reps    Theraband Level (Shoulder External Rotation) Level 2 (Red)    Internal Rotation Both;20 reps    Theraband Level (Shoulder Internal Rotation) Level 2 (Red)    Other Standing Exercises back to wall weighted ball overhead lift with PT over pressure      Shoulder Exercises: ROM/Strengthening   UBE (Upper Arm Bike) L2 x 3 min each    Nustep L5 x 6 min    Lat Pull 2 plate;20 reps    Cybex Row 2 plate;20 reps    "W" Arms with PT overpressure fpor stretch, 2x10    Other ROM/Strengthening Exercises 10# standing rows on the pulleys and 5# straight arm pulls      Shoulder Exercises: Stretch   Corner Stretch 3 reps;20 seconds  PT Short Term Goals - 02/25/20 1058      PT SHORT TERM GOAL #1   Title Pt will be I with HEP    Time 2    Period Weeks    Status Achieved    Target Date 02/26/20             PT Long Term Goals - 02/12/20 1350      PT LONG TERM GOAL #1   Title Pt will be I with advanced HEP    Time 6    Period Weeks    Status New    Target Date 03/25/20      PT LONG TERM GOAL #2   Title Pt will report 50% reduced shoulder pain    Time 6    Period Weeks    Status New    Target Date 03/25/20      PT LONG TERM GOAL #3   Title Pt will report no pain with end range L shoulder ER/IR    Time 6    Status New    Target Date 03/25/20                  Plan - 03/03/20 1057    Clinical Impression Statement Patient reports shoulders doing well, he however is very unsteady with his gait and seemed to have some instability with transfers from sitting.  Almost losing balance, reports only one fall in the past 6 months.  He does need a lot of cues with the exercises due to rounded shoulders and weak posterior shoulder and upper back    PT Next Visit Plan he sees Dr. Georgina Snell next week, I think we should ask to see him for LE strength and balance as this seems to be very unsafe at this point    Consulted and Agree with Plan of Care Patient           Patient will benefit from skilled therapeutic intervention in order to improve the following deficits and impairments:  Decreased range of motion, Impaired UE functional use, Increased muscle spasms, Pain, Impaired flexibility, Decreased strength, Postural dysfunction  Visit Diagnosis: Stiffness of left shoulder, not elsewhere classified  Acute pain of right shoulder  Acute pain of left shoulder  Cramp and spasm     Problem List Patient Active Problem List   Diagnosis Date Noted   Bilateral shoulder pain 11/22/2019   Myalgia 11/22/2019   Gait disorder 11/22/2019   Febrile illness 12/27/2018   Choledocholithiasis 12/27/2018   Low back pain 07/11/2017   Non compliance w medication regimen 08/23/2016   Peripheral neuropathy 08/23/2016   COPD (chronic obstructive pulmonary disease) ([place]) 04/19/2016   Hyperglycemia 02/10/2016   Preventative health care 07/29/2015   CAP (community acquired pneumonia) 05/30/2015   Abnormal CXR 05/30/2015   Cough 04/22/2015   Asthma with exacerbation 04/22/2015   Urine abnormality 04/22/2015   Abdominal pain 09/11/2014   Abnormal liver function test 09/11/2014   Dysphagia 07/29/2014   Anxiety state 09/06/2013   CKD (chronic kidney disease) 09/06/2013   Hypothyroidism 09/06/2013   Rash and nonspecific skin  eruption 02/27/2013   Dizziness and giddiness 01/04/2013   Abnormal TSH 06/04/2012   Gross hematuria 01/09/2012   Bruising 06/05/2011   Dementia ([place]) 09/01/2010   Cough variant asthma vs UACS  01/14/2010   ERECTILE DYSFUNCTION, ORGANIC 04/17/2009   FATIGUE 06/27/2008   PSA, INCREASED 06/27/2008   SCIATICA, RIGHT 04/28/2008   SCHATZKI'S RING 07/18/2007   HIATAL HERNIA 07/18/2007  ABNORMAL ELECTROCARDIOGRAM 06/21/2007   Hyperlipidemia 03/23/2007   Essential hypertension 03/23/2007   Allergic rhinitis 03/23/2007   GERD 03/23/2007   BENIGN PROSTATIC HYPERTROPHY 03/23/2007   NEPHROLITHIASIS, HX OF 03/23/2007    Sumner Boast., PT 03/03/2020, 11:01 AM  [place]. [place], [place], [address] Phone: [phone]   Fax:  [phone]  Name: DAIVIK OVERLEY MRN: [id number] Date of Birth: [birth year]

## 2020-03-09 ENCOUNTER — Ambulatory Visit: Payer: PPO | Admitting: Family Medicine

## 2020-03-09 DIAGNOSIS — H52203 Unspecified astigmatism, bilateral: Secondary | ICD-10-CM | POA: Diagnosis not present

## 2020-03-09 DIAGNOSIS — H43813 Vitreous degeneration, bilateral: Secondary | ICD-10-CM | POA: Diagnosis not present

## 2020-03-09 DIAGNOSIS — Z83518 Family history of other specified eye disorder: Secondary | ICD-10-CM | POA: Diagnosis not present

## 2020-03-09 DIAGNOSIS — H16223 Keratoconjunctivitis sicca, not specified as Sjogren's, bilateral: Secondary | ICD-10-CM | POA: Diagnosis not present

## 2020-03-09 DIAGNOSIS — Z961 Presence of intraocular lens: Secondary | ICD-10-CM | POA: Diagnosis not present

## 2020-03-09 DIAGNOSIS — H524 Presbyopia: Secondary | ICD-10-CM | POA: Diagnosis not present

## 2020-03-09 DIAGNOSIS — H3554 Dystrophies primarily involving the retinal pigment epithelium: Secondary | ICD-10-CM | POA: Diagnosis not present

## 2020-03-09 DIAGNOSIS — H353131 Nonexudative age-related macular degeneration, bilateral, early dry stage: Secondary | ICD-10-CM | POA: Diagnosis not present

## 2020-03-09 DIAGNOSIS — H5203 Hypermetropia, bilateral: Secondary | ICD-10-CM | POA: Diagnosis not present

## 2020-03-10 ENCOUNTER — Encounter: Payer: Self-pay | Admitting: Physical Therapy

## 2020-03-10 ENCOUNTER — Ambulatory Visit: Payer: PPO | Attending: Family Medicine | Admitting: Physical Therapy

## 2020-03-10 ENCOUNTER — Other Ambulatory Visit: Payer: Self-pay

## 2020-03-10 DIAGNOSIS — M25512 Pain in left shoulder: Secondary | ICD-10-CM | POA: Insufficient documentation

## 2020-03-10 DIAGNOSIS — R2681 Unsteadiness on feet: Secondary | ICD-10-CM | POA: Diagnosis not present

## 2020-03-10 DIAGNOSIS — R252 Cramp and spasm: Secondary | ICD-10-CM | POA: Diagnosis not present

## 2020-03-10 DIAGNOSIS — M25612 Stiffness of left shoulder, not elsewhere classified: Secondary | ICD-10-CM | POA: Insufficient documentation

## 2020-03-10 DIAGNOSIS — M25511 Pain in right shoulder: Secondary | ICD-10-CM | POA: Insufficient documentation

## 2020-03-10 NOTE — Progress Notes (Unsigned)
   I, Stephen Wilkins, LAT, ATC acting as a scribe for Stephen Leader, MD.  Stephen Wilkins is a 86 y.o. male who presents to [place] at [facility] today for f/u L shoulder pain after re-injuring his L shoulder when he fell in the kitchen. Pt was last seen by Dr. Georgina Snell on 02/04/20 and was given a subacromial injection and referred to a different PT location ([place]) of which he's completed 4 visits. Today, pt reports shoulder is doing great.  He notes his right shoulder started hurting a little bit last night but overall he thinks he is doing pretty well.  He notes physical therapy is transitioning to working on his gait.  He and his wife notes he has a shuffling gait and is at risk for falls.  Dx imaging: 01/13/20 L shoulder XR           12/30/19 R shoulder XR   Pertinent review of systems: No fevers or chills  Relevant historical information: Hypertension, COPD, mild dementia   Exam:  BP 140/90   Pulse 77   Ht 6' (1.829 m)   Wt 197 lb 6.4 oz (89.5 kg)   SpO2 97%   BMI 26.77 kg/m  General: Well Developed, well nourished, and in no acute distress.   MSK: Shoulders bilaterally normal-appearing normal motion normal strength Gait shuffling broad-based gait      Assessment and Plan: 86 y.o. male with shoulder pain much improved with physical therapy and injections.  Continue home exercise program and recheck back with me as needed.  Gait and fall risk: Agree with ongoing PT.   PDMP not reviewed this encounter. No orders of the defined types were placed in this encounter.  No orders of the defined types were placed in this encounter.    Discussed warning signs or symptoms. Please see discharge instructions. Patient expresses understanding.   The above documentation has been reviewed and is accurate and complete Stephen Wilkins, M.D.

## 2020-03-10 NOTE — Therapy (Signed)
[place]. [place], [place], [address] Phone: [phone]   Fax:  [phone]  Physical Therapy Treatment  Patient Details  Name: Stephen Wilkins MRN: [id number] Date of Birth: [birth year] Referring Provider (PT): Georgina Snell   Encounter Date: 03/10/2020   PT End of Session - 03/10/20 1400    Visit Number 5    Date for PT Re-Evaluation 04/13/20    PT Start Time 1320    PT Stop Time 1400    PT Time Calculation (min) 40 min    Activity Tolerance Patient tolerated treatment well    Behavior During Therapy [facility] for tasks assessed/performed           Past Medical History:  Diagnosis Date  . ABNORMAL ELECTROCARDIOGRAM 06/21/2007  . ALLERGIC RHINITIS 03/23/2007  . Anxiety state, unspecified 09/06/2013  . ASTHMATIC BRONCHITIS, ACUTE 04/27/2007  . BENIGN PROSTATIC HYPERTROPHY 03/23/2007  . BRONCHITIS NOT SPECIFIED AS ACUTE OR CHRONIC 04/21/2007  . BURSITIS, RIGHT HIP 07/31/2008  . COPD (chronic obstructive pulmonary disease) ([place]) 04/19/2016  . Cough 01/29/2009  . Dementia ([place]) 09/01/2010  . ERECTILE DYSFUNCTION 03/23/2007  . ERECTILE DYSFUNCTION, ORGANIC 04/17/2009  . Esophageal stricture   . FREQUENCY, URINARY 04/26/2010  . GERD 03/23/2007  . HIATAL HERNIA   . HYPERLIPIDEMIA 03/23/2007  . HYPERTENSION 03/23/2007  . MILD COGNITIVE IMPAIRMENT SO STATED 05/19/2010  . NEPHROLITHIASIS, HX OF 03/23/2007  . PSA, INCREASED 06/27/2008  . RASH-NONVESICULAR 03/23/2007  . REACTIVE AIRWAY DISEASE 01/14/2010  . SCHATZKI'S RING   . SCIATICA, RIGHT 04/28/2008  . Unspecified hypothyroidism 09/06/2013  . Wheezing 04/17/2009    Past Surgical History:  Procedure Laterality Date  . ERCP N/A 12/28/2018   Procedure: ENDOSCOPIC RETROGRADE CHOLANGIOPANCREATOGRAPHY (ERCP);  Surgeon: Ladene Artist, MD;  Location[facility];  Service: Endoscopy;  Laterality: N/A;  . REMOVAL OF STONES  12/28/2018   Procedure: REMOVAL OF STONES;  Surgeon: Ladene Artist, MD;  Location: [facility];  Service: Endoscopy;;  balloon sweep  . SPHINCTEROTOMY  12/28/2018   Procedure: SPHINCTEROTOMY;  Surgeon: Ladene Artist, MD;  Location: [facility];  Service: Endoscopy;;  . TONSILLECTOMY      There were no vitals filed for this visit.   Subjective Assessment - 03/10/20 1328    Subjective Patient reports that he is doing well, less shoulder pain.    Currently in Pain? No/denies                             [facility] Adult PT Treatment/Exercise - 03/10/20 0001      Shoulder Exercises: Standing   External Rotation Both;20 reps    Theraband Level (Shoulder External Rotation) Level 2 (Red)    Internal Rotation Both;20 reps    Theraband Level (Shoulder Internal Rotation) Level 2 (Red)    Other Standing Exercises back to wall weighted ball overhead lift with PT over pressure      Shoulder Exercises: ROM/Strengthening   UBE (Upper Arm Bike) L2 x 3 min each    Nustep L5 x 6 min    Lat Pull 2 plate;20 reps    Cybex Row 2 plate;20 reps    "W" Arms with PT overpressure fpor stretch, 2x10    Other ROM/Strengthening Exercises 10# standing rows on the pulleys and 5# straight arm pulls    Other ROM/Strengthening Exercises 5# Leg extension, 25# leg curls 2x10      Shoulder Exercises: Stretch  Corner Stretch 3 reps;20 seconds    Internal Rotation Stretch 5 reps    Internal Rotation Stretch Limitations with towel and with cane behind the back                    PT Short Term Goals - 02/25/20 1058      PT SHORT TERM GOAL #1   Title Pt will be I with HEP    Time 2    Period Weeks    Status Achieved    Target Date 02/26/20             PT Long Term Goals - 03/10/20 1405      PT LONG TERM GOAL #2   Title Pt will report 50% reduced shoulder pain    Status Partially Met      PT LONG TERM GOAL #3   Title Pt will report no pain with end range L shoulder ER/IR    Status Partially Met                 Plan -  03/10/20 1400    Clinical Impression Statement Patient reports that his shoulders are better, reports that he is feeling pretty good, some pain at times, but feels like this is going in the right direction, has to have cues to slow down and do correctly.    PT Next Visit Plan I sent Dr. Georgina Snell a note to try to get okay to treat for the balance and unsteadiness    Consulted and Agree with Plan of Care Patient           Patient will benefit from skilled therapeutic intervention in order to improve the following deficits and impairments:  Decreased range of motion, Impaired UE functional use, Increased muscle spasms, Pain, Impaired flexibility, Decreased strength, Postural dysfunction  Visit Diagnosis: Stiffness of left shoulder, not elsewhere classified  Acute pain of right shoulder  Acute pain of left shoulder  Cramp and spasm     Problem List Patient Active Problem List   Diagnosis Date Noted  . Bilateral shoulder pain 11/22/2019  . Myalgia 11/22/2019  . Gait disorder 11/22/2019  . Febrile illness 12/27/2018  . Choledocholithiasis 12/27/2018  . Low back pain 07/11/2017  . Non compliance w medication regimen 08/23/2016  . Peripheral neuropathy 08/23/2016  . COPD (chronic obstructive pulmonary disease) ([place]) 04/19/2016  . Hyperglycemia 02/10/2016  . Preventative health care 07/29/2015  . CAP (community acquired pneumonia) 05/30/2015  . Abnormal CXR 05/30/2015  . Cough 04/22/2015  . Asthma with exacerbation 04/22/2015  . Urine abnormality 04/22/2015  . Abdominal pain 09/11/2014  . Abnormal liver function test 09/11/2014  . Dysphagia 07/29/2014  . Anxiety state 09/06/2013  . CKD (chronic kidney disease) 09/06/2013  . Hypothyroidism 09/06/2013  . Rash and nonspecific skin eruption 02/27/2013  . Dizziness and giddiness 01/04/2013  . Abnormal TSH 06/04/2012  . Gross hematuria 01/09/2012  . Bruising 06/05/2011  . Dementia ([place]) 09/01/2010  . Cough variant asthma vs UACS   01/14/2010  . ERECTILE DYSFUNCTION, ORGANIC 04/17/2009  . FATIGUE 06/27/2008  . PSA, INCREASED 06/27/2008  . SCIATICA, RIGHT 04/28/2008  . SCHATZKI'S RING 07/18/2007  . HIATAL HERNIA 07/18/2007  . ABNORMAL ELECTROCARDIOGRAM 06/21/2007  . Hyperlipidemia 03/23/2007  . Essential hypertension 03/23/2007  . Allergic rhinitis 03/23/2007  . GERD 03/23/2007  . BENIGN PROSTATIC HYPERTROPHY 03/23/2007  . NEPHROLITHIASIS, HX OF 03/23/2007    Sumner Boast., PT 03/10/2020, 2:06 PM  [place]  [place]. [place], [place], [address] Phone: [phone]   Fax:  [phone]  Name: Stephen Wilkins MRN: [id number] Date of Birth: [birth year]

## 2020-03-11 ENCOUNTER — Ambulatory Visit: Payer: PPO | Admitting: Family Medicine

## 2020-03-11 ENCOUNTER — Other Ambulatory Visit: Payer: Self-pay

## 2020-03-11 VITALS — BP 140/90 | HR 77 | Ht 72.0 in | Wt 197.4 lb

## 2020-03-11 DIAGNOSIS — R269 Unspecified abnormalities of gait and mobility: Secondary | ICD-10-CM

## 2020-03-11 DIAGNOSIS — R42 Dizziness and giddiness: Secondary | ICD-10-CM | POA: Diagnosis not present

## 2020-03-11 DIAGNOSIS — M25512 Pain in left shoulder: Secondary | ICD-10-CM

## 2020-03-11 DIAGNOSIS — G8929 Other chronic pain: Secondary | ICD-10-CM | POA: Diagnosis not present

## 2020-03-11 DIAGNOSIS — M25511 Pain in right shoulder: Secondary | ICD-10-CM

## 2020-03-11 NOTE — Patient Instructions (Signed)
Thank you for coming in today.  Continue the home exercises for your shoulder  Ok to transition PT to balance and gait.  Recheck with me as needed.

## 2020-03-17 ENCOUNTER — Ambulatory Visit: Payer: PPO | Admitting: Physical Therapy

## 2020-03-17 ENCOUNTER — Other Ambulatory Visit: Payer: Self-pay

## 2020-03-17 ENCOUNTER — Encounter: Payer: Self-pay | Admitting: Physical Therapy

## 2020-03-17 DIAGNOSIS — M25511 Pain in right shoulder: Secondary | ICD-10-CM

## 2020-03-17 DIAGNOSIS — M25612 Stiffness of left shoulder, not elsewhere classified: Secondary | ICD-10-CM | POA: Diagnosis not present

## 2020-03-17 DIAGNOSIS — M25512 Pain in left shoulder: Secondary | ICD-10-CM

## 2020-03-17 DIAGNOSIS — R252 Cramp and spasm: Secondary | ICD-10-CM

## 2020-03-17 DIAGNOSIS — R2681 Unsteadiness on feet: Secondary | ICD-10-CM

## 2020-03-17 NOTE — Therapy (Signed)
[place]. [place], [place], [address] Phone: [phone]   Fax:  [phone]  Physical Therapy Evaluation  Patient Details  Name: Stephen Wilkins MRN: [id number] Date of Birth: 1927-06-25 Referring Provider (PT): Georgina Snell   Encounter Date: 03/17/2020   PT End of Session - 03/17/20 4742    Visit Number 6    Date for PT Re-Evaluation 04/13/20    PT Start Time 1315    PT Stop Time 1359    PT Time Calculation (min) 44 min    Activity Tolerance Patient tolerated treatment well    Behavior During Therapy [facility] for tasks assessed/performed           Past Medical History:  Diagnosis Date   ABNORMAL ELECTROCARDIOGRAM 06/21/2007   ALLERGIC RHINITIS 03/23/2007   Anxiety state, unspecified 09/06/2013   ASTHMATIC BRONCHITIS, ACUTE 04/27/2007   BENIGN PROSTATIC HYPERTROPHY 03/23/2007   BRONCHITIS NOT SPECIFIED AS ACUTE OR CHRONIC 04/21/2007   BURSITIS, RIGHT HIP 07/31/2008   COPD (chronic obstructive pulmonary disease) ([place]) 04/19/2016   Cough 01/29/2009   Dementia ([place]) 09/01/2010   ERECTILE DYSFUNCTION 03/23/2007   ERECTILE DYSFUNCTION, ORGANIC 04/17/2009   Esophageal stricture    FREQUENCY, URINARY 04/26/2010   GERD 03/23/2007   HIATAL HERNIA    HYPERLIPIDEMIA 03/23/2007   HYPERTENSION 03/23/2007   MILD COGNITIVE IMPAIRMENT SO STATED 05/19/2010   NEPHROLITHIASIS, HX OF 03/23/2007   PSA, INCREASED 06/27/2008   RASH-NONVESICULAR 03/23/2007   REACTIVE AIRWAY DISEASE 01/14/2010   SCHATZKI'S RING    SCIATICA, RIGHT 04/28/2008   Unspecified hypothyroidism 09/06/2013   Wheezing 04/17/2009    Past Surgical History:  Procedure Laterality Date   ERCP N/A 12/28/2018   Procedure: ENDOSCOPIC RETROGRADE CHOLANGIOPANCREATOGRAPHY (ERCP);  Surgeon: Ladene Artist, MD;  Location[facility];  Service: Endoscopy;  Laterality: N/A;   REMOVAL OF STONES  12/28/2018   Procedure: REMOVAL OF STONES;  Surgeon:  Ladene Artist, MD;  [facility];  Service: Endoscopy;;  balloon sweep   SPHINCTEROTOMY  12/28/2018   Procedure: Joan Mayans;  Surgeon: Ladene Artist, MD;  [facility];  Service: Endoscopy;;   TONSILLECTOMY      There were no vitals filed for this visit.    Subjective Assessment - 03/17/20 1317    Subjective Patient reports that he had to get something on a low shelf and he sat on the floor, he reports that he could not get up, he had to crawl to a place to use his arms to get up, denies any shoulder pain.  Dr. Georgina Snell agreed for me to check his balance    Currently in Pain? No/denies              [facility] PT Assessment - 03/17/20 0001      Ambulation/Gait   Gait Comments slow shuffling gait, he is unsteady no device, steps are so small the heel does not clear the other foots toe      Balance   Balance Assessed Yes      Standardized Balance Assessment   Standardized Balance Assessment Timed Up and Go Test;Berg Balance Test      Berg Balance Test   Sit to Stand Able to stand without using hands and stabilize independently    Standing Unsupported Able to stand safely 2 minutes    Sitting with Back Unsupported but Feet Supported on Floor or Stool Able to sit safely and securely 2 minutes    Stand to Sit Controls  descent by using hands    Transfers Able to transfer safely, definite need of hands    Standing Unsupported with Eyes Closed Able to stand 10 seconds safely    Standing Unsupported with Feet Together Able to place feet together independently and stand for 1 minute with supervision    From Standing, Reach Forward with Outstretched Arm Can reach forward >12 cm safely (5")    From Standing Position, Pick up Object from [place] to pick up shoe safely and easily    From Standing Position, Turn to Look Behind Over each Shoulder Looks behind from both sides and weight shifts well    Turn 360 Degrees Able to turn 360 degrees safely but slowly     Standing Unsupported, Alternately Place Feet on Step/Stool Able to complete 4 steps without aid or supervision    Standing Unsupported, One Foot in Horse Shoe to take small step independently and hold 30 seconds    Standing on One Leg Tries to lift leg/unable to hold 3 seconds but remains standing independently    Total Score 43      Timed Up and Go Test   Normal TUG (seconds) 23    TUG Comments no device                      Objective measurements completed on examination: See above findings.       [facility] Adult PT Treatment/Exercise - 03/17/20 0001      High Level Balance   High Level Balance Activities Negotiating over obstacles    High Level Balance Comments worked on kneeling and getting up from floor with use of hands and then tried without unable, did some walking outside on uneven terrain      Shoulder Exercises: ROM/Strengthening   UBE (Upper Arm Bike) L2 x 3 min each    Nustep L5 x 6 min    Lat Pull 2 plate;20 reps    Cybex Row 2 plate;20 reps                    PT Short Term Goals - 02/25/20 1058      PT SHORT TERM GOAL #1   Title Pt will be I with HEP    Time 2    Period Weeks    Status Achieved    Target Date 02/26/20             PT Long Term Goals - 03/17/20 1400      PT LONG TERM GOAL #5   Title increase Berg balance score to 47/56    Time 8    Status New                  Plan - 03/17/20 1357    Clinical Impression Statement I assesd his balance today with the okay from Dr. Georgina Snell, he shuffles his feet when he walks, his heel does not clear the the other foot.  His Berg balance test was 43/56, his [place] Section was 22 seconds, both put him at a very high risk for falls.  He has a lot of difficulty with dynamic surfaces and difficulty getting up from the floor, reports that his legs feel weak.    PT Treatment/Interventions ADLs/Self Care Home Management;Electrical Stimulation;Iontophoresis 4mg /ml Dexamethasone;Moist  Heat;Neuromuscular re-education;Therapeutic exercise;Therapeutic activities;Patient/family education;Manual techniques;Passive range of motion;Balance training;Functional mobility training;Stair training;Gait training    PT Next Visit Plan added balance and LE strength and gait for the treatment  plan    Consulted and Agree with Plan of Care Patient           Patient will benefit from skilled therapeutic intervention in order to improve the following deficits and impairments:  Decreased range of motion,Impaired UE functional use,Increased muscle spasms,Pain,Impaired flexibility,Decreased strength,Postural dysfunction,Decreased balance,Abnormal gait  Visit Diagnosis: Stiffness of left shoulder, not elsewhere classified  Unsteadiness on feet  Acute pain of right shoulder  Acute pain of left shoulder  Cramp and spasm     Problem List Patient Active Problem List   Diagnosis Date Noted   Bilateral shoulder pain 11/22/2019   Myalgia 11/22/2019   Gait disorder 11/22/2019   Febrile illness 12/27/2018   Choledocholithiasis 12/27/2018   Low back pain 07/11/2017   Non compliance w medication regimen 08/23/2016   Peripheral neuropathy 08/23/2016   COPD (chronic obstructive pulmonary disease) ([place]) 04/19/2016   Hyperglycemia 02/10/2016   Preventative health care 07/29/2015   CAP (community acquired pneumonia) 05/30/2015   Abnormal CXR 05/30/2015   Cough 04/22/2015   Asthma with exacerbation 04/22/2015   Urine abnormality 04/22/2015   Abdominal pain 09/11/2014   Abnormal liver function test 09/11/2014   Dysphagia 07/29/2014   Anxiety state 09/06/2013   CKD (chronic kidney disease) 09/06/2013   Hypothyroidism 09/06/2013   Rash and nonspecific skin eruption 02/27/2013   Dizziness and giddiness 01/04/2013   Abnormal TSH 06/04/2012   Gross hematuria 01/09/2012   Bruising 06/05/2011   Dementia ([place]) 09/01/2010   Cough variant asthma vs UACS  01/14/2010    ERECTILE DYSFUNCTION, ORGANIC 04/17/2009   FATIGUE 06/27/2008   PSA, INCREASED 06/27/2008   SCIATICA, RIGHT 04/28/2008   SCHATZKI'S RING 07/18/2007   HIATAL HERNIA 07/18/2007   ABNORMAL ELECTROCARDIOGRAM 06/21/2007   Hyperlipidemia 03/23/2007   Essential hypertension 03/23/2007   Allergic rhinitis 03/23/2007   GERD 03/23/2007   BENIGN PROSTATIC HYPERTROPHY 03/23/2007   NEPHROLITHIASIS, HX OF 03/23/2007    Sumner Boast., PT 03/17/2020, 2:04 PM  [place]. [place], [place], [address] Phone: [phone]   Fax:  [phone]  Name: Stephen Wilkins MRN: [id number] Date of Birth: [birth year]

## 2020-03-20 ENCOUNTER — Other Ambulatory Visit: Payer: Self-pay | Admitting: Internal Medicine

## 2020-03-20 NOTE — Telephone Encounter (Signed)
Please refill as per office routine med refill policy (all routine meds refilled for 3 mo or monthly per pt preference up to one year from last visit, then month to month grace period for 3 mo, then further med refills will have to be denied)  

## 2020-03-25 ENCOUNTER — Other Ambulatory Visit: Payer: Self-pay

## 2020-03-25 ENCOUNTER — Encounter: Payer: Self-pay | Admitting: Physical Therapy

## 2020-03-25 ENCOUNTER — Ambulatory Visit: Payer: PPO | Admitting: Physical Therapy

## 2020-03-25 DIAGNOSIS — M25512 Pain in left shoulder: Secondary | ICD-10-CM

## 2020-03-25 DIAGNOSIS — M25612 Stiffness of left shoulder, not elsewhere classified: Secondary | ICD-10-CM | POA: Diagnosis not present

## 2020-03-25 DIAGNOSIS — R2681 Unsteadiness on feet: Secondary | ICD-10-CM

## 2020-03-25 DIAGNOSIS — M25511 Pain in right shoulder: Secondary | ICD-10-CM

## 2020-03-25 DIAGNOSIS — R252 Cramp and spasm: Secondary | ICD-10-CM

## 2020-03-25 NOTE — Therapy (Signed)
[place]. [place], [place], [address] Phone: [phone]   Fax:  [phone]  Physical Therapy Treatment  Patient Details  Name: Stephen Wilkins MRN: [id number] Date of Birth: [birth year] Referring Provider (PT): Georgina Snell   Encounter Date: 03/25/2020   PT End of Session - 03/25/20 1400    Visit Number 7    Date for PT Re-Evaluation 04/13/20    PT Start Time 1320    PT Stop Time 1400    PT Time Calculation (min) 40 min    Activity Tolerance Patient tolerated treatment well    Behavior During Therapy [facility] for tasks assessed/performed           Past Medical History:  Diagnosis Date   ABNORMAL ELECTROCARDIOGRAM 06/21/2007   ALLERGIC RHINITIS 03/23/2007   Anxiety state, unspecified 09/06/2013   ASTHMATIC BRONCHITIS, ACUTE 04/27/2007   BENIGN PROSTATIC HYPERTROPHY 03/23/2007   BRONCHITIS NOT SPECIFIED AS ACUTE OR CHRONIC 04/21/2007   BURSITIS, RIGHT HIP 07/31/2008   COPD (chronic obstructive pulmonary disease) ([place]) 04/19/2016   Cough 01/29/2009   Dementia ([place]) 09/01/2010   ERECTILE DYSFUNCTION 03/23/2007   ERECTILE DYSFUNCTION, ORGANIC 04/17/2009   Esophageal stricture    FREQUENCY, URINARY 04/26/2010   GERD 03/23/2007   HIATAL HERNIA    HYPERLIPIDEMIA 03/23/2007   HYPERTENSION 03/23/2007   MILD COGNITIVE IMPAIRMENT SO STATED 05/19/2010   NEPHROLITHIASIS, HX OF 03/23/2007   PSA, INCREASED 06/27/2008   RASH-NONVESICULAR 03/23/2007   REACTIVE AIRWAY DISEASE 01/14/2010   SCHATZKI'S RING    SCIATICA, RIGHT 04/28/2008   Unspecified hypothyroidism 09/06/2013   Wheezing 04/17/2009    Past Surgical History:  Procedure Laterality Date   ERCP N/A 12/28/2018   Procedure: ENDOSCOPIC RETROGRADE CHOLANGIOPANCREATOGRAPHY (ERCP);  Surgeon: Ladene Artist, MD;  Location[facility];  Service: Endoscopy;  Laterality: N/A;   REMOVAL OF STONES  12/28/2018   Procedure: REMOVAL OF STONES;  Surgeon: Ladene Artist, MD;  [facility];  Service: Endoscopy;;  balloon sweep   SPHINCTEROTOMY  12/28/2018   Procedure: Joan Mayans;  Surgeon: Ladene Artist, MD;  [facility];  Service: Endoscopy;;   TONSILLECTOMY      There were no vitals filed for this visit.   Subjective Assessment - 03/25/20 1326    Subjective Patient reports that he has some increase right shoulder pain.    Currently in Pain? Yes    Pain Score 5     Pain Location Shoulder    Pain Orientation Right    Pain Descriptors / Indicators Aching;Sore    Aggravating Factors  at night the pain is bad at times 8/10    Pain Relieving Factors heat, rub some medicine on it helps                             [facility] Adult PT Treatment/Exercise - 03/25/20 0001      Shoulder Exercises: Seated   Other Seated Exercises sit to stand without hands      Shoulder Exercises: Standing   External Rotation Both;20 reps    Theraband Level (Shoulder External Rotation) Level 2 (Red)    Internal Rotation Both;20 reps    Theraband Level (Shoulder Internal Rotation) Level 2 (Red)      Shoulder Exercises: ROM/Strengthening   UBE (Upper Arm Bike) L2 x 3 min each    Nustep L5 x 6 min    Lat Pull 2 plate;20 reps  Cybex Row 2 plate;20 reps    "W" Arms with PT overpressure fpor stretch, 2x10    Other ROM/Strengthening Exercises 10# Leg extension, 25# leg curls 2x10      Modalities   Modalities Iontophoresis      Iontophoresis   Type of Iontophoresis Dexamethasone    Location right shoulder anterolateral    Dose 37mA    Time 4 hour patch                    PT Short Term Goals - 02/25/20 1058      PT SHORT TERM GOAL #1   Title Pt will be I with HEP    Time 2    Period Weeks    Status Achieved    Target Date 02/26/20             PT Long Term Goals - 03/25/20 1402      PT LONG TERM GOAL #1   Title Pt will be I with advanced HEP    Status On-going      PT LONG TERM GOAL #2    Title Pt will report 50% reduced shoulder pain    Status On-going      PT LONG TERM GOAL #3   Title Pt will report no pain with end range L shoulder ER/IR    Status On-going                 Plan - 03/25/20 1400    Clinical Impression Statement patient reports shoulder pain mostly at night and then some with reaching, he feels like it is better since PT but asked about a shot.  I did iontophoresis, today, he denied any of the exercises causing the pain.  He reports liking the leg exercises and the practice from getting up as this is what bothers him most at home.  He has only been able to come in 1x/week    PT Next Visit Plan added balance and LE strength and gait for the treatment plan, see if inot helped    Consulted and Agree with Plan of Care Patient           Patient will benefit from skilled therapeutic intervention in order to improve the following deficits and impairments:  Decreased range of motion,Impaired UE functional use,Increased muscle spasms,Pain,Impaired flexibility,Decreased strength,Postural dysfunction,Decreased balance,Abnormal gait  Visit Diagnosis: Stiffness of left shoulder, not elsewhere classified  Unsteadiness on feet  Acute pain of right shoulder  Acute pain of left shoulder  Cramp and spasm     Problem List Patient Active Problem List   Diagnosis Date Noted   Bilateral shoulder pain 11/22/2019   Myalgia 11/22/2019   Gait disorder 11/22/2019   Febrile illness 12/27/2018   Choledocholithiasis 12/27/2018   Low back pain 07/11/2017   Non compliance w medication regimen 08/23/2016   Peripheral neuropathy 08/23/2016   COPD (chronic obstructive pulmonary disease) ([place]) 04/19/2016   Hyperglycemia 02/10/2016   Preventative health care 07/29/2015   CAP (community acquired pneumonia) 05/30/2015   Abnormal CXR 05/30/2015   Cough 04/22/2015   Asthma with exacerbation 04/22/2015   Urine abnormality 04/22/2015   Abdominal pain  09/11/2014   Abnormal liver function test 09/11/2014   Dysphagia 07/29/2014   Anxiety state 09/06/2013   CKD (chronic kidney disease) 09/06/2013   Hypothyroidism 09/06/2013   Rash and nonspecific skin eruption 02/27/2013   Dizziness and giddiness 01/04/2013   Abnormal TSH 06/04/2012   Gross hematuria 01/09/2012   Bruising  06/05/2011   Dementia ([place]) 09/01/2010   Cough variant asthma vs UACS  01/14/2010   ERECTILE DYSFUNCTION, ORGANIC 04/17/2009   FATIGUE 06/27/2008   PSA, INCREASED 06/27/2008   SCIATICA, RIGHT 04/28/2008   SCHATZKI'S RING 07/18/2007   HIATAL HERNIA 07/18/2007   ABNORMAL ELECTROCARDIOGRAM 06/21/2007   Hyperlipidemia 03/23/2007   Essential hypertension 03/23/2007   Allergic rhinitis 03/23/2007   GERD 03/23/2007   BENIGN PROSTATIC HYPERTROPHY 03/23/2007   NEPHROLITHIASIS, HX OF 03/23/2007    Sumner Boast., PT 03/25/2020, 2:04 PM  [place]. [place], [place], [address] Phone: [phone]   Fax:  [phone]  Name: DEVESH MONFORTE MRN: [id number] Date of Birth: 02/26/1928

## 2020-03-31 ENCOUNTER — Ambulatory Visit: Payer: PPO | Admitting: Physical Therapy

## 2020-04-07 ENCOUNTER — Telehealth (INDEPENDENT_AMBULATORY_CARE_PROVIDER_SITE_OTHER): Payer: PPO | Admitting: Internal Medicine

## 2020-04-07 DIAGNOSIS — J4541 Moderate persistent asthma with (acute) exacerbation: Secondary | ICD-10-CM | POA: Diagnosis not present

## 2020-04-07 DIAGNOSIS — R739 Hyperglycemia, unspecified: Secondary | ICD-10-CM

## 2020-04-07 DIAGNOSIS — J309 Allergic rhinitis, unspecified: Secondary | ICD-10-CM | POA: Diagnosis not present

## 2020-04-07 MED ORDER — PREDNISONE 10 MG PO TABS: ORAL_TABLET | ORAL | 0 refills | Status: DC

## 2020-04-07 NOTE — Progress Notes (Signed)
Patient ID: Stephen Wilkins, male   DOB: 07-26-1927, 87 y.o.   MRN: [id number]  Virtual Visit via Video Note  I connected with Stephen Wilkins on 04/07/20 at 10:00 AM EST by a video enabled telemedicine application and verified that I am speaking with the correct person using two identifiers.  Location of all participants today Patient: at home Provider: at office   I discussed the limitations of evaluation and management by telemedicine and the availability of in person appointments. The patient expressed understanding and agreed to proceed.  History of Present Illness: Here with 3 days onset cough, with sob/wheezing worse at night and talking on the phone, seems to occur at same time as nasal sinus allergies worsening for unclear reason as well, denies fever, chills, ST, and Pt denies chest pain, orthopnea, PND, increased LE swelling, palpitations, dizziness or syncope.  Symbicort helps but makes him nervous and is expensive  Pt denies new neurological symptoms such as new headache, or facial or extremity weakness or numbness   Pt denies polydipsia, polyuria, Past Medical History:  Diagnosis Date  . ABNORMAL ELECTROCARDIOGRAM 06/21/2007  . ALLERGIC RHINITIS 03/23/2007  . Anxiety state, unspecified 09/06/2013  . ASTHMATIC BRONCHITIS, ACUTE 04/27/2007  . BENIGN PROSTATIC HYPERTROPHY 03/23/2007  . BRONCHITIS NOT SPECIFIED AS ACUTE OR CHRONIC 04/21/2007  . BURSITIS, RIGHT HIP 07/31/2008  . COPD (chronic obstructive pulmonary disease) ([place]) 04/19/2016  . Cough 01/29/2009  . Dementia ([place]) 09/01/2010  . ERECTILE DYSFUNCTION 03/23/2007  . ERECTILE DYSFUNCTION, ORGANIC 04/17/2009  . Esophageal stricture   . FREQUENCY, URINARY 04/26/2010  . GERD 03/23/2007  . HIATAL HERNIA   . HYPERLIPIDEMIA 03/23/2007  . HYPERTENSION 03/23/2007  . MILD COGNITIVE IMPAIRMENT SO STATED 05/19/2010  . NEPHROLITHIASIS, HX OF 03/23/2007  . PSA, INCREASED 06/27/2008  . RASH-NONVESICULAR 03/23/2007  . REACTIVE AIRWAY DISEASE  01/14/2010  . SCHATZKI'S RING   . SCIATICA, RIGHT 04/28/2008  . Unspecified hypothyroidism 09/06/2013  . Wheezing 04/17/2009   Past Surgical History:  Procedure Laterality Date  . ERCP N/A 12/28/2018   Procedure: ENDOSCOPIC RETROGRADE CHOLANGIOPANCREATOGRAPHY (ERCP);  Surgeon: Ladene Artist, MD;  Location: [facility];  Service: Endoscopy;  Laterality: N/A;  . REMOVAL OF STONES  12/28/2018   Procedure: REMOVAL OF STONES;  Surgeon: Ladene Artist, MD;  Location: [facility];  Service: Endoscopy;;  balloon sweep  . SPHINCTEROTOMY  12/28/2018   Procedure: SPHINCTEROTOMY;  Surgeon: Ladene Artist, MD;  Location: [facility];  Service: Endoscopy;;  . TONSILLECTOMY      reports that he has never smoked. He has never used smokeless tobacco. He reports that he does not drink alcohol and does not use drugs. family history includes Asthma in his sister; Cancer in an other family member; Emphysema in his brother; Hypertension in an other family member; Lung cancer in his brother. Allergies  Allergen Reactions  . Atorvastatin     REACTION: myalgia  . Doxazosin Mesylate     REACTION: dizziness  . Hydrocodone-Homatropine Other (See Comments)    anxiety   Current Outpatient Medications on File Prior to Visit  Medication Sig Dispense Refill  . albuterol (VENTOLIN HFA) 108 (90 Base) MCG/ACT inhaler Inhale 2 puffs into the lungs every 6 (six) hours as needed for wheezing or shortness of breath. 24 g 3  . amoxicillin (AMOXIL) 500 MG tablet Take 500 mg by mouth 3 (three) times daily.    Marland Kitchen aspirin EC 81 MG tablet Take 1 tablet (81 mg total) by mouth daily. (Patient taking  differently: Take 81 mg by mouth 2 (two) times a week. ) 90 tablet 11  . Cholecalciferol (VITAMIN D) 1000 UNITS capsule Take 1,000 Units by mouth every other day.     . desoximetasone (TOPICORT) 0.25 % cream APPLY SPARINGLY TO THE AFFECTED AREA TWICE DAILY AS NEEDED AS DIRECTED (Patient taking differently: Apply 1 application  topically 2 (two) times daily. Itching) 30 g 1  . lansoprazole (PREVACID) 30 MG capsule TAKE 1 CAPSULE BY MOUTH EVERY DAY AT 12 NOON 90 capsule 3  . levothyroxine (SYNTHROID) 112 MCG tablet TAKE 1 TABLET(112 MCG) BY MOUTH DAILY 90 tablet 0  . losartan (COZAAR) 100 MG tablet TAKE 1 TABLET(100 MG) BY MOUTH DAILY 90 tablet 2  . Multiple Vitamins-Minerals (CENTRUM SILVER PO) Take 1 tablet by mouth daily.     . polyethylene glycol powder (MIRALAX) powder Take 1/2 capful by mouth once daily (Patient taking differently: Take 17 g by mouth 2 (two) times a week. ) 850 g 5  . tamsulosin (FLOMAX) 0.4 MG CAPS capsule TAKE 1 CAPSULE(0.4 MG) BY MOUTH AT BEDTIME 90 capsule 1  . traMADol (ULTRAM) 50 MG tablet Take 1 tablet (50 mg total) by mouth every 8 (eight) hours as needed for severe pain. 15 tablet 0  . vitamin B-12 (CYANOCOBALAMIN) 100 MCG tablet Take 100 mcg by mouth once a week.    . vitamin C (ASCORBIC ACID) 500 MG tablet Take 1,000 mg by mouth daily.      No current facility-administered medications on file prior to visit.    Observations/Objective: Alert, NAD, appropriate mood and affect, resps normal, cn 2-12 intact, moves all 4s, no visible rash or swelling Lab Results  Component Value Date   WBC 9.1 11/22/2019   HGB 12.2 (L) 11/22/2019   HCT 36.5 (L) 11/22/2019   PLT 255 11/22/2019   GLUCOSE 96 11/22/2019   CHOL 212 (H) 11/22/2019   TRIG 182 (H) 11/22/2019   HDL 43 11/22/2019   LDLDIRECT 68.0 08/29/2017   LDLCALC 137 (H) 11/22/2019   ALT 9 11/22/2019   AST 16 11/22/2019   NA 140 11/22/2019   K 5.2 11/22/2019   CL 104 11/22/2019   CREATININE 2.09 (H) 11/22/2019   BUN 34 (H) 11/22/2019   CO2 29 11/22/2019   TSH 3.86 11/22/2019   PSA 3.94 03/12/2014   HGBA1C 6.0 (H) 11/22/2019   Assessment and Plan: See notes  Follow Up Instructions: See notes   I discussed the assessment and treatment plan with the patient. The patient was provided an opportunity to ask questions and all were  answered. The patient agreed with the plan and demonstrated an understanding of the instructions.   The patient was advised to call back or seek an in-person evaluation if the symptoms worsen or if the condition fails to improve as anticipated.   Cathlean Cower, MD

## 2020-04-08 ENCOUNTER — Ambulatory Visit: Payer: PPO | Admitting: Physical Therapy

## 2020-04-14 ENCOUNTER — Ambulatory Visit: Payer: PPO | Attending: Family Medicine | Admitting: Physical Therapy

## 2020-04-14 ENCOUNTER — Other Ambulatory Visit: Payer: Self-pay

## 2020-04-14 ENCOUNTER — Encounter: Payer: Self-pay | Admitting: Physical Therapy

## 2020-04-14 DIAGNOSIS — M25512 Pain in left shoulder: Secondary | ICD-10-CM | POA: Insufficient documentation

## 2020-04-14 DIAGNOSIS — R252 Cramp and spasm: Secondary | ICD-10-CM | POA: Diagnosis not present

## 2020-04-14 DIAGNOSIS — R2681 Unsteadiness on feet: Secondary | ICD-10-CM | POA: Insufficient documentation

## 2020-04-14 DIAGNOSIS — M25511 Pain in right shoulder: Secondary | ICD-10-CM | POA: Diagnosis not present

## 2020-04-14 DIAGNOSIS — M25612 Stiffness of left shoulder, not elsewhere classified: Secondary | ICD-10-CM | POA: Insufficient documentation

## 2020-04-14 NOTE — Therapy (Signed)
[address] [place], [place], [address] Phone: [phone]   Fax:  [phone]  Physical Therapy Treatment  Patient Details  Name: Stephen Wilkins MRN: [id number] Date of Birth: [birth year] Referring Provider (PT): Marikay Alar Date: 04/14/2020   PT End of Session - 04/14/20 4193    Visit Number 8    Date for PT Re-Evaluation 05/15/20    PT Start Time 7902    PT Stop Time 1440    PT Time Calculation (min) 43 min    Activity Tolerance Patient tolerated treatment well    Behavior During Therapy [facility] for tasks assessed/performed           Past Medical History:  Diagnosis Date  . ABNORMAL ELECTROCARDIOGRAM 06/21/2007  . ALLERGIC RHINITIS 03/23/2007  . Anxiety state, unspecified 09/06/2013  . ASTHMATIC BRONCHITIS, ACUTE 04/27/2007  . BENIGN PROSTATIC HYPERTROPHY 03/23/2007  . BRONCHITIS NOT SPECIFIED AS ACUTE OR CHRONIC 04/21/2007  . BURSITIS, RIGHT HIP 07/31/2008  . COPD (chronic obstructive pulmonary disease) ([place]) 04/19/2016  . Cough 01/29/2009  . Dementia ([place]) 09/01/2010  . ERECTILE DYSFUNCTION 03/23/2007  . ERECTILE DYSFUNCTION, ORGANIC 04/17/2009  . Esophageal stricture   . FREQUENCY, URINARY 04/26/2010  . GERD 03/23/2007  . HIATAL HERNIA   . HYPERLIPIDEMIA 03/23/2007  . HYPERTENSION 03/23/2007  . MILD COGNITIVE IMPAIRMENT SO STATED 05/19/2010  . NEPHROLITHIASIS, HX OF 03/23/2007  . PSA, INCREASED 06/27/2008  . RASH-NONVESICULAR 03/23/2007  . REACTIVE AIRWAY DISEASE 01/14/2010  . SCHATZKI'S RING   . SCIATICA, RIGHT 04/28/2008  . Unspecified hypothyroidism 09/06/2013  . Wheezing 04/17/2009    Past Surgical History:  Procedure Laterality Date  . ERCP N/A 12/28/2018   Procedure: ENDOSCOPIC RETROGRADE CHOLANGIOPANCREATOGRAPHY (ERCP);  Surgeon: Ladene Artist, MD;  Location: [facility];  Service: Endoscopy;  Laterality: N/A;  . REMOVAL OF STONES  12/28/2018   Procedure: REMOVAL OF STONES;  Surgeon: Ladene Artist, MD;  Location: [facility];  Service: Endoscopy;;  balloon sweep  . SPHINCTEROTOMY  12/28/2018   Procedure: SPHINCTEROTOMY;  Surgeon: Ladene Artist, MD;  Location: [facility];  Service: Endoscopy;;  . TONSILLECTOMY      There were no vitals filed for this visit.   Subjective Assessment - 04/14/20 1406    Subjective Patient has not been in to see [place] in 3 weeks.  He reports that he has continued to have shoulder pain, he reports that he has had "thyrodi problems"  my neck hurts today.  reports that his balance is not very good    Currently in Pain? Yes    Pain Score 3     Pain Location Shoulder    Pain Orientation Right    Aggravating Factors  activity              [facility] PT Assessment - 04/14/20 0001      Assessment   Medical Diagnosis B shoulder pain and neck pain    Referring Provider (PT) Galen Daft Adult PT Treatment/Exercise - 04/14/20 0001      Ambulation/Gait   Gait Comments gait outside on grass, uneven terrain, HHA cues to pick up feet and take bigger steps.      High Level Balance   High Level Balance Activities Negotiating over obstacles    High Level Balance Comments fwd backward, airex balance beam,  ball toss solid surface, then on airex, side stepping on airex balance beam,      Shoulder Exercises: ROM/Strengthening   Nustep L5 x 6 min                    PT Short Term Goals - 02/25/20 1058      PT SHORT TERM GOAL #1   Title Pt will be I with HEP    Time 2    Period Weeks    Status Achieved    Target Date 02/26/20             PT Long Term Goals - 04/14/20 1539      PT LONG TERM GOAL #1   Title Pt will be I with advanced HEP    Status On-going      PT LONG TERM GOAL #2   Title Pt will report 50% reduced shoulder pain    Status On-going      PT LONG TERM GOAL #3   Title Pt will report no pain with end range L shoulder ER/IR    Status Partially Met      PT LONG TERM GOAL #4    Title Patient will be able to verbalize a plan for continued community-based activity upon discharge from PT    Status On-going      PT LONG TERM GOAL #5   Title increase Berg balance score to 47/56    Status On-going                 Plan - 04/14/20 1531    Clinical Impression Statement Patient did not c/o shoulder pain today, his biggest c/o was loss of balance and shuffling his feet.  I worked with him on stepping over objects he really struggled with this not taking big enough steps and catching heel, I walkwed iwth him outside and with HHA and cues to speed up and alittle assist he was walking faster with bigger steps but still issues with the shuffles.    PT Next Visit Plan work on balance and gait    Consulted and Agree with Plan of Care Patient           Patient will benefit from skilled therapeutic intervention in order to improve the following deficits and impairments:  Decreased range of motion,Impaired UE functional use,Increased muscle spasms,Pain,Impaired flexibility,Decreased strength,Postural dysfunction,Decreased balance,Abnormal gait  Visit Diagnosis: Stiffness of left shoulder, not elsewhere classified - Plan: PT plan of care cert/re-cert  Unsteadiness on feet - Plan: PT plan of care cert/re-cert  Acute pain of right shoulder - Plan: PT plan of care cert/re-cert  Acute pain of left shoulder - Plan: PT plan of care cert/re-cert  Cramp and spasm - Plan: PT plan of care cert/re-cert     Problem List Patient Active Problem List   Diagnosis Date Noted  . Bilateral shoulder pain 11/22/2019  . Myalgia 11/22/2019  . Gait disorder 11/22/2019  . Febrile illness 12/27/2018  . Choledocholithiasis 12/27/2018  . Low back pain 07/11/2017  . Non compliance w medication regimen 08/23/2016  . Peripheral neuropathy 08/23/2016  . COPD (chronic obstructive pulmonary disease) ([place]) 04/19/2016  . Hyperglycemia 02/10/2016  . Preventative health care 07/29/2015  .  CAP (community acquired pneumonia) 05/30/2015  . Abnormal CXR 05/30/2015  . Cough 04/22/2015  . Asthma with exacerbation 04/22/2015  . Urine abnormality 04/22/2015  . Abdominal pain 09/11/2014  . Abnormal liver function test 09/11/2014  . Dysphagia 07/29/2014  . Anxiety  state 09/06/2013  . CKD (chronic kidney disease) 09/06/2013  . Hypothyroidism 09/06/2013  . Rash and nonspecific skin eruption 02/27/2013  . Dizziness and giddiness 01/04/2013  . Abnormal TSH 06/04/2012  . Gross hematuria 01/09/2012  . Bruising 06/05/2011  . Dementia ([place]) 09/01/2010  . Cough variant asthma vs UACS  01/14/2010  . ERECTILE DYSFUNCTION, ORGANIC 04/17/2009  . FATIGUE 06/27/2008  . PSA, INCREASED 06/27/2008  . SCIATICA, RIGHT 04/28/2008  . SCHATZKI'S RING 07/18/2007  . HIATAL HERNIA 07/18/2007  . ABNORMAL ELECTROCARDIOGRAM 06/21/2007  . Hyperlipidemia 03/23/2007  . Essential hypertension 03/23/2007  . Allergic rhinitis 03/23/2007  . GERD 03/23/2007  . BENIGN PROSTATIC HYPERTROPHY 03/23/2007  . NEPHROLITHIASIS, HX OF 03/23/2007    Sumner Boast., PT 04/14/2020, 3:42 PM  [address] [place], [place], [address] Phone: [phone]   Fax:  [phone]  Name: MONTAGUE CORELLA MRN: [id number] Date of Birth: 31-Mar-1928

## 2020-04-20 ENCOUNTER — Encounter: Payer: Self-pay | Admitting: Internal Medicine

## 2020-04-20 NOTE — Assessment & Plan Note (Signed)
Mild, for contd albuterol hfa prn, also predpac asd, and change symbicort to dulera or advair if less expensive for him

## 2020-04-20 NOTE — Assessment & Plan Note (Signed)
Mild, also to improve with predpac asd, to f/u any worsening symptoms or concerns

## 2020-04-20 NOTE — Assessment & Plan Note (Signed)
Lab Results  Component Value Date   HGBA1C 6.0 (H) 11/22/2019   Stable, pt to continue current medical treatment - diet

## 2020-04-20 NOTE — Patient Instructions (Signed)
Please take all new medication as prescribed 

## 2020-04-28 ENCOUNTER — Ambulatory Visit: Payer: PPO | Admitting: Physical Therapy

## 2020-05-11 ENCOUNTER — Other Ambulatory Visit: Payer: Self-pay | Admitting: Internal Medicine

## 2020-05-11 NOTE — Telephone Encounter (Signed)
Please refill as per office routine med refill policy (all routine meds refilled for 3 mo or monthly per pt preference up to one year from last visit, then month to month grace period for 3 mo, then further med refills will have to be denied)  

## 2020-05-21 ENCOUNTER — Telehealth: Payer: Self-pay | Admitting: Internal Medicine

## 2020-05-21 NOTE — Telephone Encounter (Signed)
1.Medication Requested: rosuvastatin (CRESTOR) 10 MG tablet    2. Pharmacy (Name, Street, [place]): [facility] [phone] - [place], [place]  3. On Med List: no  4. Last Visit with PCP:  1.4.22  5. Next visit date with PCP: n/a    Agent: Please be advised that RX refills may take up to 3 business days. We ask that you follow-up with your pharmacy.

## 2020-05-21 NOTE — Telephone Encounter (Signed)
Ok to hold on refill, thanks

## 2020-07-28 DIAGNOSIS — H04123 Dry eye syndrome of bilateral lacrimal glands: Secondary | ICD-10-CM | POA: Diagnosis not present

## 2020-09-08 ENCOUNTER — Telehealth: Payer: Self-pay | Admitting: Internal Medicine

## 2020-09-08 MED ORDER — BUDESONIDE-FORMOTEROL FUMARATE 160-4.5 MCG/ACT IN AERO: 2.0000 | INHALATION_SPRAY | Freq: Two times a day (BID) | RESPIRATORY_TRACT | 3 refills | Status: DC

## 2020-09-08 NOTE — Telephone Encounter (Signed)
Ok this is done  Pt due for ROV - please advise pt wife to make appt as pt has dementia

## 2020-09-08 NOTE — Telephone Encounter (Signed)
Spouse requesting order for Symbicort (not on med list)to be sent to [place], [place] She states he has been taking symbicort for a long time.  Spouse states she will have patient call back to schedule appointment

## 2020-09-09 NOTE — Telephone Encounter (Signed)
Message left for patient to call back and scheduled ROV

## 2020-09-09 NOTE — Telephone Encounter (Signed)
Error

## 2020-09-22 ENCOUNTER — Other Ambulatory Visit: Payer: Self-pay

## 2020-09-22 ENCOUNTER — Encounter: Payer: Self-pay | Admitting: Internal Medicine

## 2020-09-22 ENCOUNTER — Ambulatory Visit (INDEPENDENT_AMBULATORY_CARE_PROVIDER_SITE_OTHER): Payer: PPO | Admitting: Internal Medicine

## 2020-09-22 VITALS — BP 106/62 | HR 90 | Temp 98.5°F | Ht 72.0 in | Wt 199.8 lb

## 2020-09-22 DIAGNOSIS — E559 Vitamin D deficiency, unspecified: Secondary | ICD-10-CM | POA: Diagnosis not present

## 2020-09-22 DIAGNOSIS — J45991 Cough variant asthma: Secondary | ICD-10-CM

## 2020-09-22 DIAGNOSIS — Z0000 Encounter for general adult medical examination without abnormal findings: Secondary | ICD-10-CM | POA: Diagnosis not present

## 2020-09-22 DIAGNOSIS — E890 Postprocedural hypothyroidism: Secondary | ICD-10-CM

## 2020-09-22 DIAGNOSIS — E538 Deficiency of other specified B group vitamins: Secondary | ICD-10-CM

## 2020-09-22 DIAGNOSIS — R739 Hyperglycemia, unspecified: Secondary | ICD-10-CM | POA: Diagnosis not present

## 2020-09-22 MED ORDER — ALPRAZOLAM 0.5 MG PO TABS: ORAL_TABLET | ORAL | 2 refills | Status: DC

## 2020-09-22 MED ORDER — ALBUTEROL SULFATE HFA 108 (90 BASE) MCG/ACT IN AERS: 2.0000 | INHALATION_SPRAY | Freq: Four times a day (QID) | RESPIRATORY_TRACT | 11 refills | Status: DC | PRN

## 2020-09-22 MED ORDER — FLUTICASONE PROPIONATE HFA 220 MCG/ACT IN AERO: 1.0000 | INHALATION_SPRAY | Freq: Two times a day (BID) | RESPIRATORY_TRACT | 12 refills | Status: DC

## 2020-09-22 NOTE — Patient Instructions (Signed)
Ok to stop the symbicort  Please take all new medication as prescribed - the flovent and albuterol inhaler as needed, and the xanax as needed  Please continue all other medications as before, and refills have been done if requested.  Please have the pharmacy call with any other refills you may need.  Please continue your efforts at being more active, low cholesterol diet, and weight control.  You are otherwise up to date with prevention measures today.  Please keep your appointments with your specialists as you may have planned  Please go to the LAB at the blood drawing area for the tests to be done  You will be contacted by phone if any changes need to be made immediately.  Otherwise, you will receive a letter about your results with an explanation, but please check with MyChart first.  Please remember to sign up for MyChart if you have not done so, as this will be important to you in the future with finding out test results, communicating by private email, and scheduling acute appointments online when needed.  Please make an Appointment to return in 6 months, or sooner if needed

## 2020-09-22 NOTE — Progress Notes (Signed)
Patient ID: Stephen Wilkins, male   DOB: 12-21-1927, 87 y.o.   MRN: [id number]         Chief Complaint:: wellness exam and Follow-up  Asthma, anxiety,        HPI:  Stephen Wilkins is a 87 y.o. male here for wellness exam; declines shingrix, o/w up to date with preventive referrals and immunizations.                        Also has tried symbicort several times but makes him very nervous for hours after each try. Still needs with occasional sob/wheezing mild intermittent.  Refuses to take anymore, needs change.  Pt denies chest pain, orthopnea, PND, increased LE swelling, palpitations, dizziness or syncope.  Denies worsening depressive symptoms, suicidal ideation, or panic; has ongoing anxiety, takes the xanax infrequently.   Pt denies polydipsia, polyuria, or new focal neuro s/s.  No other new complaints  Wt Readings from Last 3 Encounters:  09/22/20 199 lb 12.8 oz (90.6 kg)  03/11/20 197 lb 6.4 oz (89.5 kg)  02/04/20 199 lb 12.8 oz (90.6 kg)   BP Readings from Last 3 Encounters:  09/22/20 106/62  03/11/20 140/90  02/04/20 110/74   Immunization History  Administered Date(s) Administered   Fluad Quad(high Dose 65+) 12/29/2018, 01/07/2020   Influenza Split 01/09/2012   Influenza Whole 03/23/2007, 02/02/2009, 01/13/2010   Influenza, High Dose Seasonal PF 02/07/2014, 01/28/2015, 02/28/2017, 01/19/2018   Influenza,inj,Quad PF,6+ Mos 12/05/2012   Moderna Sars-Covid-2 Vaccination 04/16/2019, 05/14/2019, 02/18/2020, 08/01/2020   Pneumococcal Conjugate-13 06/05/2013   Pneumococcal Polysaccharide-23 03/23/2007   Td 07/31/2008   Tdap 01/09/2019  There are no preventive care reminders to display for this patient.    Past Medical History:  Diagnosis Date   ABNORMAL ELECTROCARDIOGRAM 06/21/2007   ALLERGIC RHINITIS 03/23/2007   Anxiety state, unspecified 09/06/2013   ASTHMATIC BRONCHITIS, ACUTE 04/27/2007   BENIGN PROSTATIC HYPERTROPHY 03/23/2007   BRONCHITIS NOT SPECIFIED AS ACUTE OR CHRONIC  04/21/2007   BURSITIS, RIGHT HIP 07/31/2008   COPD (chronic obstructive pulmonary disease) ([place]) 04/19/2016   Cough 01/29/2009   Dementia ([place]) 09/01/2010   ERECTILE DYSFUNCTION 03/23/2007   ERECTILE DYSFUNCTION, ORGANIC 04/17/2009   Esophageal stricture    FREQUENCY, URINARY 04/26/2010   GERD 03/23/2007   HIATAL HERNIA    HYPERLIPIDEMIA 03/23/2007   HYPERTENSION 03/23/2007   MILD COGNITIVE IMPAIRMENT SO STATED 05/19/2010   NEPHROLITHIASIS, HX OF 03/23/2007   PSA, INCREASED 06/27/2008   RASH-NONVESICULAR 03/23/2007   REACTIVE AIRWAY DISEASE 01/14/2010   SCHATZKI'S RING    SCIATICA, RIGHT 04/28/2008   Unspecified hypothyroidism 09/06/2013   Wheezing 04/17/2009   Past Surgical History:  Procedure Laterality Date   ERCP N/A 12/28/2018   Procedure: ENDOSCOPIC RETROGRADE CHOLANGIOPANCREATOGRAPHY (ERCP);  Surgeon: Ladene Artist, MD;  Location: [facility];  Service: Endoscopy;  Laterality: N/A;   REMOVAL OF STONES  12/28/2018   Procedure: REMOVAL OF STONES;  Surgeon: Ladene Artist, MD;  Location: [facility];  Service: Endoscopy;;  balloon sweep   SPHINCTEROTOMY  12/28/2018   Procedure: Joan Mayans;  Surgeon: Ladene Artist, MD;  Location: [facility];  Service: Endoscopy;;   TONSILLECTOMY      reports that he has never smoked. He has never used smokeless tobacco. He reports that he does not drink alcohol and does not use drugs. family history includes Asthma in his sister; Cancer in an other family member; Emphysema in his brother; Hypertension in an other family member; Lung cancer  in his brother. Allergies  Allergen Reactions   Atorvastatin     REACTION: myalgia   Doxazosin Mesylate     REACTION: dizziness   Hydrocodone Bit-Homatrop Mbr Other (See Comments)    anxiety   Current Outpatient Medications on File Prior to Visit  Medication Sig Dispense Refill   aspirin EC 81 MG tablet Take 1 tablet (81 mg total) by mouth daily. (Patient taking differently: Take 81 mg by mouth 2  (two) times a week.) 90 tablet 11   Cholecalciferol (VITAMIN D) 1000 UNITS capsule Take 1,000 Units by mouth every other day.      desoximetasone (TOPICORT) 0.25 % cream APPLY SPARINGLY TO THE AFFECTED AREA TWICE DAILY AS NEEDED AS DIRECTED (Patient taking differently: Apply 1 application topically 2 (two) times daily. Itching) 30 g 1   lansoprazole (PREVACID) 30 MG capsule TAKE 1 CAPSULE BY MOUTH EVERY DAY AT 12 NOON 90 capsule 3   levothyroxine (SYNTHROID) 112 MCG tablet TAKE 1 TABLET(112 MCG) BY MOUTH DAILY 90 tablet 0   losartan (COZAAR) 100 MG tablet Take 1 tablet (100 mg total) by mouth daily. 90 tablet 2   Multiple Vitamins-Minerals (CENTRUM SILVER PO) Take 1 tablet by mouth daily.      polyethylene glycol powder (MIRALAX) powder Take 1/2 capful by mouth once daily (Patient taking differently: Take 17 g by mouth 2 (two) times a week.) 850 g 5   tamsulosin (FLOMAX) 0.4 MG CAPS capsule TAKE 1 CAPSULE(0.4 MG) BY MOUTH AT BEDTIME 90 capsule 1   vitamin B-12 (CYANOCOBALAMIN) 100 MCG tablet Take 100 mcg by mouth once a week.     vitamin C (ASCORBIC ACID) 500 MG tablet Take 1,000 mg by mouth daily.      amoxicillin (AMOXIL) 500 MG tablet Take 500 mg by mouth 3 (three) times daily. (Patient not taking: Reported on 09/22/2020)     budesonide-formoterol (SYMBICORT) 160-4.5 MCG/ACT inhaler Inhale 2 puffs into the lungs 2 (two) times daily. (Patient not taking: Reported on 09/22/2020) 1 each 3   MODERNA COVID-19 VACCINE 100 MCG/0.5ML injection      predniSONE (DELTASONE) 10 MG tablet 3 tabs by mouth per day for 3 days,2tabs per day for 3 days,1tab per day for 3 days (Patient not taking: Reported on 09/22/2020) 18 tablet 0   traMADol (ULTRAM) 50 MG tablet Take 1 tablet (50 mg total) by mouth every 8 (eight) hours as needed for severe pain. (Patient not taking: Reported on 09/22/2020) 15 tablet 0   No current facility-administered medications on file prior to visit.        ROS:  All others reviewed and  negative.  Objective        PE:  BP 106/62 (BP Location: Left Arm, Patient Position: Sitting, Cuff Size: Large)   Pulse 90   Temp 98.5 F (36.9 C) (Oral)   Ht 6' (1.829 m)   Wt 199 lb 12.8 oz (90.6 kg)   SpO2 94%   BMI 27.10 kg/m                 Constitutional: Pt appears in NAD               HENT: Head: NCAT.                Right Ear: External ear normal.                 Left Ear: External ear normal.  Eyes: . Pupils are equal, round, and reactive to light. Conjunctivae and EOM are normal               Nose: without d/c or deformity               Neck: Neck supple. Gross normal ROM               Cardiovascular: Normal rate and regular rhythm.                 Pulmonary/Chest: Effort normal and breath sounds without rales or wheezing.                Abd:  Soft, NT, ND, + BS, no organomegaly               Neurological: Pt is alert. At baseline orientation, motor grossly intact               Skin: Skin is warm. No rashes, no other new lesions, LE edema - none               Psychiatric: Pt behavior is normal without agitation   Micro: none  Cardiac tracings I have personally interpreted today:  none  Pertinent Radiological findings (summarize): none   Lab Results  Component Value Date   WBC 9.1 11/22/2019   HGB 12.2 (L) 11/22/2019   HCT 36.5 (L) 11/22/2019   PLT 255 11/22/2019   GLUCOSE 96 11/22/2019   CHOL 212 (H) 11/22/2019   TRIG 182 (H) 11/22/2019   HDL 43 11/22/2019   LDLDIRECT 68.0 08/29/2017   LDLCALC 137 (H) 11/22/2019   ALT 9 11/22/2019   AST 16 11/22/2019   NA 140 11/22/2019   K 5.2 11/22/2019   CL 104 11/22/2019   CREATININE 2.09 (H) 11/22/2019   BUN 34 (H) 11/22/2019   CO2 29 11/22/2019   TSH 3.86 11/22/2019   PSA 3.94 03/12/2014   HGBA1C 6.0 (H) 11/22/2019   Assessment/Plan:  Stephen Wilkins is a 39 y.o. White or Caucasian [1] male with  has a past medical history of ABNORMAL ELECTROCARDIOGRAM (06/21/2007), ALLERGIC RHINITIS (03/23/2007),  Anxiety state, unspecified (09/06/2013), ASTHMATIC BRONCHITIS, ACUTE (04/27/2007), BENIGN PROSTATIC HYPERTROPHY (03/23/2007), BRONCHITIS NOT SPECIFIED AS ACUTE OR CHRONIC (04/21/2007), BURSITIS, RIGHT HIP (07/31/2008), COPD (chronic obstructive pulmonary disease) ([place]) (04/19/2016), Cough (01/29/2009), Dementia ([place]) (09/01/2010), ERECTILE DYSFUNCTION (03/23/2007), ERECTILE DYSFUNCTION, ORGANIC (04/17/2009), Esophageal stricture, FREQUENCY, URINARY (04/26/2010), GERD (03/23/2007), HIATAL HERNIA, HYPERLIPIDEMIA (03/23/2007), HYPERTENSION (03/23/2007), MILD COGNITIVE IMPAIRMENT SO STATED (05/19/2010), NEPHROLITHIASIS, HX OF (03/23/2007), PSA, INCREASED (06/27/2008), RASH-NONVESICULAR (03/23/2007), REACTIVE AIRWAY DISEASE (01/14/2010), SCHATZKI'S RING, SCIATICA, RIGHT (04/28/2008), Unspecified hypothyroidism (09/06/2013), and Wheezing (04/17/2009).  Preventative health care Age and sex appropriate education and counseling updated with regular exercise and diet Referrals for preventative services - none needed Immunizations addressed - declines shingrix Smoking counseling  - none needed Evidence for depression or other mood disorder - none significant Most recent labs reviewed. I have personally reviewed and have noted: 1) the patient's medical and social history 2) The patient's current medications and supplements 3) The patient's height, weight, and BMI have been recorded in the chart   Cough variant asthma vs UACS  Unfortunately sensitive to the formoterol element of the symbicort apparently, ok to change symbicort to simple inhaled steroid such as flovent.   to f/u any worsening symptoms or concerns  Hyperglycemia Lab Results  Component Value Date   HGBA1C 6.0 (H) 11/22/2019   Stable, pt to continue current medical treatment  -  diet   Hypothyroidism Lab Results  Component Value Date   TSH 3.86 11/22/2019   Stable, pt to continue levothyroxine  Followup: Return in about 6 months (around  03/24/2021).  Cathlean Cower, MD 09/24/2020 7:55 PM [facility]

## 2020-09-24 ENCOUNTER — Encounter: Payer: Self-pay | Admitting: Internal Medicine

## 2020-09-24 NOTE — Assessment & Plan Note (Signed)
Unfortunately sensitive to the formoterol element of the symbicort apparently, ok to change symbicort to simple inhaled steroid such as flovent.   to f/u any worsening symptoms or concerns

## 2020-09-24 NOTE — Assessment & Plan Note (Signed)
Lab Results  Component Value Date   TSH 3.86 11/22/2019   Stable, pt to continue levothyroxine

## 2020-09-24 NOTE — Assessment & Plan Note (Signed)
Lab Results  Component Value Date   HGBA1C 6.0 (H) 11/22/2019   Stable, pt to continue current medical treatment  - diet

## 2020-09-24 NOTE — Assessment & Plan Note (Signed)
Age and sex appropriate education and counseling updated with regular exercise and diet Referrals for preventative services - none needed Immunizations addressed - declines shingrix Smoking counseling  - none needed Evidence for depression or other mood disorder - none significant Most recent labs reviewed. I have personally reviewed and have noted: 1) the patient's medical and social history 2) The patient's current medications and supplements 3) The patient's height, weight, and BMI have been recorded in the chart  

## 2020-09-29 ENCOUNTER — Other Ambulatory Visit: Payer: Self-pay | Admitting: Internal Medicine

## 2020-09-29 MED ORDER — FLOVENT DISKUS 250 MCG/BLIST IN AEPB: INHALATION_SPRAY | RESPIRATORY_TRACT | 11 refills | Status: DC

## 2020-09-29 NOTE — Telephone Encounter (Signed)
Ok to let pt know  The flovent inhaler was not covered by his insurance, so I sent the flovent DISCUS which is the same medication but in a disc that should be covered by the insurance

## 2020-10-01 ENCOUNTER — Other Ambulatory Visit (INDEPENDENT_AMBULATORY_CARE_PROVIDER_SITE_OTHER): Payer: PPO

## 2020-10-01 DIAGNOSIS — E890 Postprocedural hypothyroidism: Secondary | ICD-10-CM

## 2020-10-01 DIAGNOSIS — E559 Vitamin D deficiency, unspecified: Secondary | ICD-10-CM

## 2020-10-01 DIAGNOSIS — E538 Deficiency of other specified B group vitamins: Secondary | ICD-10-CM | POA: Diagnosis not present

## 2020-10-01 DIAGNOSIS — Z0000 Encounter for general adult medical examination without abnormal findings: Secondary | ICD-10-CM

## 2020-10-01 DIAGNOSIS — R739 Hyperglycemia, unspecified: Secondary | ICD-10-CM

## 2020-10-01 LAB — URINALYSIS, ROUTINE W REFLEX MICROSCOPIC
Bilirubin Urine: NEGATIVE
Hgb urine dipstick: NEGATIVE
Ketones, ur: NEGATIVE
Leukocytes,Ua: NEGATIVE
Nitrite: NEGATIVE
RBC / HPF: NONE SEEN (ref 0–?)
Specific Gravity, Urine: 1.015 (ref 1.000–1.030)
Total Protein, Urine: NEGATIVE
Urine Glucose: NEGATIVE
Urobilinogen, UA: 1 (ref 0.0–1.0)
pH: 6 (ref 5.0–8.0)

## 2020-10-01 LAB — CBC WITH DIFFERENTIAL/PLATELET
Basophils Absolute: 0.1 10*3/uL (ref 0.0–0.1)
Basophils Relative: 0.9 % (ref 0.0–3.0)
Eosinophils Absolute: 0.2 10*3/uL (ref 0.0–0.7)
Eosinophils Relative: 2.4 % (ref 0.0–5.0)
HCT: 36.9 % — ABNORMAL LOW (ref 39.0–52.0)
Hemoglobin: 12.2 g/dL — ABNORMAL LOW (ref 13.0–17.0)
Lymphocytes Relative: 27.4 % (ref 12.0–46.0)
Lymphs Abs: 1.8 10*3/uL (ref 0.7–4.0)
MCHC: 33.2 g/dL (ref 30.0–36.0)
MCV: 94.4 fl (ref 78.0–100.0)
Monocytes Absolute: 0.5 10*3/uL (ref 0.1–1.0)
Monocytes Relative: 7.7 % (ref 3.0–12.0)
Neutro Abs: 4.1 10*3/uL (ref 1.4–7.7)
Neutrophils Relative %: 61.6 % (ref 43.0–77.0)
Platelets: 221 10*3/uL (ref 150.0–400.0)
RBC: 3.9 Mil/uL — ABNORMAL LOW (ref 4.22–5.81)
RDW: 12.6 % (ref 11.5–15.5)
WBC: 6.6 10*3/uL (ref 4.0–10.5)

## 2020-10-01 LAB — BASIC METABOLIC PANEL
BUN: 36 mg/dL — ABNORMAL HIGH (ref 6–23)
CO2: 29 mEq/L (ref 19–32)
Calcium: 9.3 mg/dL (ref 8.4–10.5)
Chloride: 104 mEq/L (ref 96–112)
Creatinine, Ser: 2.16 mg/dL — ABNORMAL HIGH (ref 0.40–1.50)
GFR: 26.28 mL/min — ABNORMAL LOW (ref >60.00)
Glucose, Bld: 113 mg/dL — ABNORMAL HIGH (ref 70–99)
Potassium: 4.5 mEq/L (ref 3.5–5.1)
Sodium: 140 mEq/L (ref 135–145)

## 2020-10-01 LAB — VITAMIN B12: Vitamin B-12: 1102 pg/mL — ABNORMAL HIGH (ref 211–911)

## 2020-10-01 LAB — LIPID PANEL
Cholesterol: 206 mg/dL — ABNORMAL HIGH (ref 0–200)
HDL: 40 mg/dL (ref >39.00)
NonHDL: 166.3
Total CHOL/HDL Ratio: 5
Triglycerides: 203 mg/dL — ABNORMAL HIGH (ref 0.0–149.0)
VLDL: 40.6 mg/dL — ABNORMAL HIGH (ref 0.0–40.0)

## 2020-10-01 LAB — T4, FREE: Free T4: 0.73 ng/dL (ref 0.60–1.60)

## 2020-10-01 LAB — HEPATIC FUNCTION PANEL
ALT: 10 U/L (ref 0–53)
AST: 21 U/L (ref 0–37)
Albumin: 4 g/dL (ref 3.5–5.2)
Alkaline Phosphatase: 62 U/L (ref 39–117)
Bilirubin, Direct: 0.2 mg/dL (ref 0.0–0.3)
Total Bilirubin: 1.1 mg/dL (ref 0.2–1.2)
Total Protein: 6.7 g/dL (ref 6.0–8.3)

## 2020-10-01 LAB — HEMOGLOBIN A1C: Hgb A1c MFr Bld: 6.3 % (ref 4.6–6.5)

## 2020-10-01 LAB — LDL CHOLESTEROL, DIRECT: Direct LDL: 138 mg/dL

## 2020-10-01 LAB — TSH: TSH: 9.65 u[IU]/mL — ABNORMAL HIGH (ref 0.35–5.50)

## 2020-10-01 LAB — VITAMIN D 25 HYDROXY (VIT D DEFICIENCY, FRACTURES): VITD: 36.56 ng/mL (ref 30.00–100.00)

## 2020-10-06 ENCOUNTER — Encounter: Payer: Self-pay | Admitting: Internal Medicine

## 2020-11-02 ENCOUNTER — Telehealth: Payer: Self-pay | Admitting: Internal Medicine

## 2020-11-02 NOTE — Telephone Encounter (Signed)
Avis Epley .Marland Kitchen PA at patient home to do wellness visit  Vaughan Basta wanted to advise patient TSH was at a 10 & also wanted to advise [place] that patient has not been taking his levothyroxine (SYNTHROID) 112 MCG tablet .  Patient advised nurse that he will start taking rx daily but there are no refills listed on rx...   .1.Medication Requested: levothyroxine (SYNTHROID) 112 MCG tablet .  2. Pharmacy (Name, [id number] [place]): [place], [place]  Phone:  [phone] Fax:  [phone]  3. On Med List: yes  4. Last Visit with PCP: 09/22/20  5. Next visit date with PCP: n/a   Agent: Please be advised that RX refills may take up to 3 business days. We ask that you follow-up with your pharmacy.

## 2020-11-03 MED ORDER — LEVOTHYROXINE SODIUM 112 MCG PO TABS: ORAL_TABLET | ORAL | 1 refills | Status: DC

## 2020-12-24 ENCOUNTER — Other Ambulatory Visit: Payer: Self-pay

## 2020-12-24 ENCOUNTER — Ambulatory Visit (INDEPENDENT_AMBULATORY_CARE_PROVIDER_SITE_OTHER): Payer: PPO | Admitting: Internal Medicine

## 2020-12-24 ENCOUNTER — Encounter: Payer: Self-pay | Admitting: Internal Medicine

## 2020-12-24 VITALS — BP 116/72 | HR 99 | Temp 99.1°F | Ht 72.0 in | Wt 197.0 lb

## 2020-12-24 DIAGNOSIS — J4541 Moderate persistent asthma with (acute) exacerbation: Secondary | ICD-10-CM | POA: Diagnosis not present

## 2020-12-24 DIAGNOSIS — N184 Chronic kidney disease, stage 4 (severe): Secondary | ICD-10-CM

## 2020-12-24 DIAGNOSIS — R21 Rash and other nonspecific skin eruption: Secondary | ICD-10-CM

## 2020-12-24 DIAGNOSIS — Z23 Encounter for immunization: Secondary | ICD-10-CM | POA: Diagnosis not present

## 2020-12-24 DIAGNOSIS — Z0000 Encounter for general adult medical examination without abnormal findings: Secondary | ICD-10-CM | POA: Diagnosis not present

## 2020-12-24 MED ORDER — TAMSULOSIN HCL 0.4 MG PO CAPS: ORAL_CAPSULE | ORAL | 3 refills | Status: DC

## 2020-12-24 MED ORDER — FLOVENT DISKUS 250 MCG/BLIST IN AEPB: INHALATION_SPRAY | RESPIRATORY_TRACT | 11 refills | Status: DC

## 2020-12-24 MED ORDER — TRIAMCINOLONE ACETONIDE 0.1 % EX CREA: 1.0000 "application " | TOPICAL_CREAM | Freq: Two times a day (BID) | CUTANEOUS | 0 refills | Status: AC

## 2020-12-24 NOTE — Patient Instructions (Addendum)
Please take all new medication as prescribed - the steroid inhaler, and the steroid cream  Please continue all other medications as before, and refills have been done if requested - the flomax  Please have the pharmacy call with any other refills you may need.  Please continue your efforts at being more active, low cholesterol diet, and weight control.  You are otherwise up to date with prevention measures today.  Please keep your appointments with your specialists as you may have planned  You will be contacted regarding the referral for: Medical kidney doctor  Please make an Appointment to return in 6 months, or sooner if needed, also with Lab Appointment for testing done 3-5 days before at the [facility] (so this is for TWO appointments - please see the scheduling desk as you leave)  Due to the ongoing Covid 19 pandemic, our lab now requires an appointment for any labs done at our office.  If you need labs done and do not have an appointment, please call our office ahead of time to schedule before presenting to the lab for your testing.

## 2020-12-24 NOTE — Progress Notes (Signed)
Patient ID: Stephen Wilkins, male   DOB: [name], 1928, 88 y.o.   MRN: [id number]         Chief Complaint:: wellness exam and asthma uncontrolled, ckd4, right leg rash       HPI:  Stephen Wilkins is a 87 y.o. male here for wellness exam with wife present due to hx of dementia; pt decliens shingrix, o/w up to date with preventive referrals and immunizations                        Also pt continues to c/o mild uncontrolled asthma in the setting of dementia with ongoing noncompliance with controller therapy, though he has been counseled on this many many times, and refuses to comply once he gets home.  Pt denies chest pain, increased sob or doe, orthopnea, PND, increased LE swelling, palpitations, dizziness or syncope, except for mild intermittent wheezing worse at night..   Pt denies polydipsia, polyuria, or new focal neuro symptoms. Today also with an itchy red nontender non raised rash to right lateral leg, just cant stop scratching, but no pain, fever, red streaks or drainage.     Wt Readings from Last 3 Encounters:  12/24/20 197 lb (89.4 kg)  09/22/20 199 lb 12.8 oz (90.6 kg)  03/11/20 197 lb 6.4 oz (89.5 kg)   BP Readings from Last 3 Encounters:  12/24/20 116/72  09/22/20 106/62  03/11/20 140/90   Immunization History  Administered Date(s) Administered   Fluad Quad(high Dose 65+) 12/29/2018, 01/07/2020, 12/24/2020   Influenza Split 01/09/2012   Influenza Whole 03/23/2007, 02/02/2009, 01/13/2010   Influenza, High Dose Seasonal PF 02/07/2014, 01/28/2015, 02/28/2017, 01/19/2018   Influenza,inj,Quad PF,6+ Mos 12/05/2012   Moderna Sars-Covid-2 Vaccination 04/16/2019, 05/14/2019, 02/18/2020, 08/01/2020   Pneumococcal Conjugate-13 06/05/2013   Pneumococcal Polysaccharide-23 03/23/2007   Td 07/31/2008   Tdap 01/09/2019   There are no preventive care reminders to display for this patient.     Past Medical History:  Diagnosis Date   ABNORMAL ELECTROCARDIOGRAM 06/21/2007   ALLERGIC RHINITIS  03/23/2007   Anxiety state, unspecified 09/06/2013   ASTHMATIC BRONCHITIS, ACUTE 04/27/2007   BENIGN PROSTATIC HYPERTROPHY 03/23/2007   BRONCHITIS NOT SPECIFIED AS ACUTE OR CHRONIC 04/21/2007   BURSITIS, RIGHT HIP 07/31/2008   COPD (chronic obstructive pulmonary disease) ([place]) 04/19/2016   Cough 01/29/2009   Dementia ([place]) 09/01/2010   ERECTILE DYSFUNCTION 03/23/2007   ERECTILE DYSFUNCTION, ORGANIC 04/17/2009   Esophageal stricture    FREQUENCY, URINARY 04/26/2010   GERD 03/23/2007   HIATAL HERNIA    HYPERLIPIDEMIA 03/23/2007   HYPERTENSION 03/23/2007   MILD COGNITIVE IMPAIRMENT SO STATED 05/19/2010   NEPHROLITHIASIS, HX OF 03/23/2007   PSA, INCREASED 06/27/2008   RASH-NONVESICULAR 03/23/2007   REACTIVE AIRWAY DISEASE 01/14/2010   SCHATZKI'S RING    SCIATICA, RIGHT 04/28/2008   Unspecified hypothyroidism 09/06/2013   Wheezing 04/17/2009   Past Surgical History:  Procedure Laterality Date   ERCP N/A 12/28/2018   Procedure: ENDOSCOPIC RETROGRADE CHOLANGIOPANCREATOGRAPHY (ERCP);  Surgeon: Ladene Artist, MD;  Location: [facility];  Service: Endoscopy;  Laterality: N/A;   REMOVAL OF STONES  12/28/2018   Procedure: REMOVAL OF STONES;  Surgeon: Ladene Artist, MD;  Location: [facility];  Service: Endoscopy;;  balloon sweep   SPHINCTEROTOMY  12/28/2018   Procedure: Joan Mayans;  Surgeon: Ladene Artist, MD;  Location: [facility];  Service: Endoscopy;;   TONSILLECTOMY      reports that he has never smoked. He has never used smokeless tobacco.  He reports that he does not drink alcohol and does not use drugs. family history includes Asthma in his sister; Cancer in an other family member; Emphysema in his brother; Hypertension in an other family member; Lung cancer in his brother. Allergies  Allergen Reactions   Atorvastatin     REACTION: myalgia   Doxazosin Mesylate     REACTION: dizziness   Hydrocodone Bit-Homatrop Mbr Other (See Comments)    anxiety   Current Outpatient  Medications on File Prior to Visit  Medication Sig Dispense Refill   albuterol (VENTOLIN HFA) 108 (90 Base) MCG/ACT inhaler Inhale 2 puffs into the lungs every 6 (six) hours as needed for wheezing or shortness of breath. 8 g 11   ALPRAZolam (XANAX) 0.5 MG tablet 1/2 - 1 tab by mouth twice per day as needed 60 tablet 2   Cholecalciferol (VITAMIN D) 1000 UNITS capsule Take 1,000 Units by mouth every other day.      desoximetasone (TOPICORT) 0.25 % cream APPLY SPARINGLY TO THE AFFECTED AREA TWICE DAILY AS NEEDED AS DIRECTED (Patient taking differently: Apply 1 application topically 2 (two) times daily. Itching) 30 g 1   lansoprazole (PREVACID) 30 MG capsule TAKE 1 CAPSULE BY MOUTH EVERY DAY AT 12 NOON 90 capsule 3   levothyroxine (SYNTHROID) 112 MCG tablet TAKE 1 TABLET(112 MCG) BY MOUTH DAILY 90 tablet 1   losartan (COZAAR) 100 MG tablet Take 1 tablet (100 mg total) by mouth daily. 90 tablet 2   Multiple Vitamins-Minerals (CENTRUM SILVER PO) Take 1 tablet by mouth daily.      polyethylene glycol powder (MIRALAX) powder Take 1/2 capful by mouth once daily (Patient taking differently: Take 17 g by mouth 2 (two) times a week.) 850 g 5   vitamin B-12 (CYANOCOBALAMIN) 100 MCG tablet Take 100 mcg by mouth once a week.     vitamin C (ASCORBIC ACID) 500 MG tablet Take 1,000 mg by mouth daily.      No current facility-administered medications on file prior to visit.        ROS:  All others reviewed and negative.  Objective        PE:  BP 116/72 (BP Location: Right Arm, Patient Position: Sitting, Cuff Size: Normal)   Pulse 99   Temp 99.1 F (37.3 C) (Oral)   Ht 6' (1.829 m)   Wt 197 lb (89.4 kg)   SpO2 95%   BMI 26.72 kg/m                 Constitutional: Pt appears in NAD               HENT: Head: NCAT.                Right Ear: External ear normal.                 Left Ear: External ear normal.                Eyes: . Pupils are equal, round, and reactive to light. Conjunctivae and EOM are  normal               Nose: without d/c or deformity               Neck: Neck supple. Gross normal ROM               Cardiovascular: Normal rate and regular rhythm.  Pulmonary/Chest: Effort normal and breath sounds decreased without rales or wheezing.                Abd:  Soft, NT, ND, + BS, no organomegaly               Neurological: Pt is alert. At baseline orientation, motor grossly intact               Skin: Skin is warm. + eczema type rash to right lateral mid leg nontender, no other new lesions, LE edema - none               Psychiatric: Pt behavior is normal without agitation   Micro: none  Cardiac tracings I have personally interpreted today:  none  Pertinent Radiological findings (summarize): none   Lab Results  Component Value Date   WBC 6.6 10/01/2020   HGB 12.2 (L) 10/01/2020   HCT 36.9 (L) 10/01/2020   PLT 221.0 10/01/2020   GLUCOSE 113 (H) 10/01/2020   CHOL 206 (H) 10/01/2020   TRIG 203.0 (H) 10/01/2020   HDL 40.00 10/01/2020   LDLDIRECT 138.0 10/01/2020   LDLCALC 137 (H) 11/22/2019   ALT 10 10/01/2020   AST 21 10/01/2020   NA 140 10/01/2020   K 4.5 10/01/2020   CL 104 10/01/2020   CREATININE 2.16 (H) 10/01/2020   BUN 36 (H) 10/01/2020   CO2 29 10/01/2020   TSH 9.65 (H) 10/01/2020   PSA 3.94 03/12/2014   HGBA1C 6.3 10/01/2020   Assessment/Plan:  FARREN NELLES is a 87 y.o. White or Caucasian [1] male with  has a past medical history of ABNORMAL ELECTROCARDIOGRAM (06/21/2007), ALLERGIC RHINITIS (03/23/2007), Anxiety state, unspecified (09/06/2013), ASTHMATIC BRONCHITIS, ACUTE (04/27/2007), BENIGN PROSTATIC HYPERTROPHY (03/23/2007), BRONCHITIS NOT SPECIFIED AS ACUTE OR CHRONIC (04/21/2007), BURSITIS, RIGHT HIP (07/31/2008), COPD (chronic obstructive pulmonary disease) ([place]) (04/19/2016), Cough (01/29/2009), Dementia ([employer]) (09/01/2010), ERECTILE DYSFUNCTION (03/23/2007), ERECTILE DYSFUNCTION, ORGANIC (04/17/2009), Esophageal stricture, FREQUENCY, URINARY  (04/26/2010), GERD (03/23/2007), HIATAL HERNIA, HYPERLIPIDEMIA (03/23/2007), HYPERTENSION (03/23/2007), MILD COGNITIVE IMPAIRMENT SO STATED (05/19/2010), NEPHROLITHIASIS, HX OF (03/23/2007), PSA, INCREASED (06/27/2008), RASH-NONVESICULAR (03/23/2007), REACTIVE AIRWAY DISEASE (01/14/2010), SCHATZKI'S RING, SCIATICA, RIGHT (04/28/2008), Unspecified hypothyroidism (09/06/2013), and Wheezing (04/17/2009).  Preventative health care Age and sex appropriate education and counseling updated with regular exercise and diet Referrals for preventative services - none needed Immunizations addressed - declines shingrix Smoking counseling  - none needed Evidence for depression or other mood disorder - none significant Most recent labs reviewed. I have personally reviewed and have noted: 1) the patient's medical and social history 2) The patient's current medications and supplements 3) The patient's height, weight, and BMI have been recorded in the chart   Asthma with exacerbation Mild, d/w pt to take steroid inhaler controller one more time, as we have done many times over the past several years  CKD (chronic kidney disease) Lab Results  Component Value Date   CREATININE 2.16 (H) 10/01/2020   Stable overall, cont to avoid nephrotoxins, also refer renal   Rash and nonspecific skin eruption C/w mild eczema type - for triamcinolone cr prn,  to f/u any worsening symptoms or concerns  Followup: Return in about 6 months (around 06/23/2021).  Cathlean Cower, MD 12/27/2020 4:53 PM [facility]

## 2020-12-27 ENCOUNTER — Encounter: Payer: Self-pay | Admitting: Internal Medicine

## 2020-12-27 NOTE — Assessment & Plan Note (Signed)
Age and sex appropriate education and counseling updated with regular exercise and diet Referrals for preventative services - none needed Immunizations addressed - declines shingrix Smoking counseling  - none needed Evidence for depression or other mood disorder - none significant Most recent labs reviewed. I have personally reviewed and have noted: 1) the patient's medical and social history 2) The patient's current medications and supplements 3) The patient's height, weight, and BMI have been recorded in the chart  

## 2020-12-27 NOTE — Assessment & Plan Note (Signed)
Mild, d/w pt to take steroid inhaler controller one more time, as we have done many times over the past several years

## 2020-12-27 NOTE — Assessment & Plan Note (Signed)
C/w mild eczema type - for triamcinolone cr prn,  to f/u any worsening symptoms or concerns

## 2020-12-27 NOTE — Assessment & Plan Note (Signed)
Lab Results  Component Value Date   CREATININE 2.16 (H) 10/01/2020   Stable overall, cont to avoid nephrotoxins, also refer renal

## 2021-01-28 DIAGNOSIS — H524 Presbyopia: Secondary | ICD-10-CM | POA: Diagnosis not present

## 2021-01-28 DIAGNOSIS — Z961 Presence of intraocular lens: Secondary | ICD-10-CM | POA: Diagnosis not present

## 2021-02-05 ENCOUNTER — Telehealth: Payer: Self-pay | Admitting: Internal Medicine

## 2021-02-05 NOTE — Telephone Encounter (Signed)
Left message for patient to call me back at [phone] to schedule Medicare Annual Wellness Visit   Last AWV  11/06/20  Please schedule at anytime with [facility] if patient calls the office back.    40 Minutes appointment   Any questions, please call me at [phone]

## 2021-02-22 ENCOUNTER — Other Ambulatory Visit: Payer: Self-pay | Admitting: Internal Medicine

## 2021-02-22 NOTE — Telephone Encounter (Signed)
Please refill as per office routine med refill policy (all routine meds to be refilled for 3 mo or monthly (per pt preference) up to one year from last visit, then month to month grace period for 3 mo, then further med refills will have to be denied) ? ?

## 2021-02-23 DIAGNOSIS — D631 Anemia in chronic kidney disease: Secondary | ICD-10-CM | POA: Diagnosis not present

## 2021-02-23 DIAGNOSIS — N189 Chronic kidney disease, unspecified: Secondary | ICD-10-CM | POA: Diagnosis not present

## 2021-02-23 DIAGNOSIS — N184 Chronic kidney disease, stage 4 (severe): Secondary | ICD-10-CM | POA: Diagnosis not present

## 2021-02-23 DIAGNOSIS — I129 Hypertensive chronic kidney disease with stage 1 through stage 4 chronic kidney disease, or unspecified chronic kidney disease: Secondary | ICD-10-CM | POA: Diagnosis not present

## 2021-02-23 DIAGNOSIS — N400 Enlarged prostate without lower urinary tract symptoms: Secondary | ICD-10-CM | POA: Diagnosis not present

## 2021-03-02 ENCOUNTER — Other Ambulatory Visit: Payer: Self-pay | Admitting: Internal Medicine

## 2021-03-02 NOTE — Telephone Encounter (Signed)
Please refill as per office routine med refill policy (all routine meds to be refilled for 3 mo or monthly (per pt preference) up to one year from last visit, then month to month grace period for 3 mo, then further med refills will have to be denied) ? ?

## 2021-03-28 ENCOUNTER — Emergency Department (HOSPITAL_COMMUNITY)
Admission: EM | Admit: 2021-03-28 | Discharge: 2021-03-28 | Disposition: A | Discharge status: Home / Self Care | Payer: PPO | Attending: Emergency Medicine | Admitting: Emergency Medicine

## 2021-03-28 DIAGNOSIS — F0390 Unspecified dementia without behavioral disturbance: Secondary | ICD-10-CM | POA: Diagnosis not present

## 2021-03-28 DIAGNOSIS — N189 Chronic kidney disease, unspecified: Secondary | ICD-10-CM | POA: Diagnosis not present

## 2021-03-28 DIAGNOSIS — Z20822 Contact with and (suspected) exposure to covid-19: Secondary | ICD-10-CM | POA: Diagnosis not present

## 2021-03-28 DIAGNOSIS — E039 Hypothyroidism, unspecified: Secondary | ICD-10-CM | POA: Insufficient documentation

## 2021-03-28 DIAGNOSIS — Z79899 Other long term (current) drug therapy: Secondary | ICD-10-CM | POA: Diagnosis not present

## 2021-03-28 DIAGNOSIS — R42 Dizziness and giddiness: Secondary | ICD-10-CM | POA: Diagnosis not present

## 2021-03-28 DIAGNOSIS — J449 Chronic obstructive pulmonary disease, unspecified: Secondary | ICD-10-CM | POA: Insufficient documentation

## 2021-03-28 DIAGNOSIS — J45909 Unspecified asthma, uncomplicated: Secondary | ICD-10-CM | POA: Diagnosis not present

## 2021-03-28 DIAGNOSIS — R531 Weakness: Secondary | ICD-10-CM | POA: Diagnosis not present

## 2021-03-28 DIAGNOSIS — I129 Hypertensive chronic kidney disease with stage 1 through stage 4 chronic kidney disease, or unspecified chronic kidney disease: Secondary | ICD-10-CM | POA: Diagnosis not present

## 2021-03-28 DIAGNOSIS — R0902 Hypoxemia: Secondary | ICD-10-CM | POA: Diagnosis not present

## 2021-03-28 DIAGNOSIS — I959 Hypotension, unspecified: Secondary | ICD-10-CM | POA: Diagnosis not present

## 2021-03-28 LAB — BASIC METABOLIC PANEL
Anion gap: 7 (ref 5–15)
BUN: 38 mg/dL — ABNORMAL HIGH (ref 8–23)
CO2: 25 mmol/L (ref 22–32)
Calcium: 8.9 mg/dL (ref 8.9–10.3)
Chloride: 107 mmol/L (ref 98–111)
Creatinine, Ser: 2.56 mg/dL — ABNORMAL HIGH (ref 0.61–1.24)
GFR, Estimated: 23 mL/min — ABNORMAL LOW (ref >60)
Glucose, Bld: 117 mg/dL — ABNORMAL HIGH (ref 70–99)
Potassium: 4.7 mmol/L (ref 3.5–5.1)
Sodium: 139 mmol/L (ref 135–145)

## 2021-03-28 LAB — CBC
HCT: 37.9 % — ABNORMAL LOW (ref 39.0–52.0)
Hemoglobin: 12.2 g/dL — ABNORMAL LOW (ref 13.0–17.0)
MCH: 31.8 pg (ref 26.0–34.0)
MCHC: 32.2 g/dL (ref 30.0–36.0)
MCV: 98.7 fL (ref 80.0–100.0)
Platelets: 234 10*3/uL (ref 150–400)
RBC: 3.84 MIL/uL — ABNORMAL LOW (ref 4.22–5.81)
RDW: 12.2 % (ref 11.5–15.5)
WBC: 8.7 10*3/uL (ref 4.0–10.5)
nRBC: 0 % (ref 0.0–0.2)

## 2021-03-28 LAB — CBG MONITORING, ED: Glucose-Capillary: 111 mg/dL — ABNORMAL HIGH (ref 70–99)

## 2021-03-28 LAB — RESP PANEL BY RT-PCR (FLU A&B, COVID) ARPGX2
Influenza A by PCR: NEGATIVE
Influenza B by PCR: NEGATIVE
SARS Coronavirus 2 by RT PCR: NEGATIVE

## 2021-03-28 MED ORDER — LACTATED RINGERS IV SOLN: INTRAVENOUS | Status: DC

## 2021-03-28 MED ORDER — LACTATED RINGERS IV BOLUS
1000.0000 mL | Freq: Once | INTRAVENOUS | Status: AC
  Administered 2021-03-28: 13:00:00 1000 mL via INTRAVENOUS

## 2021-03-28 NOTE — ED Triage Notes (Signed)
Pt arrives via ems from home. Pt had a syncopal episode after getting out of the shower.no injury, pt was able to take himself to the ground. Initial bp with ems was 86/59. Pt arrives alert and oriented x4. States he has been feeling dizzy and weak all over this morning.

## 2021-03-28 NOTE — ED Provider Notes (Signed)
[facility] DEPT Provider Note   CSN: [id number] Arrival date & time: 03/28/21  1136     History Chief Complaint  Patient presents with   Near Syncope   Dizziness    Stephen Wilkins is a 87 y.o. male.  87 year old male presents after having syncopal event at home.  Patient states that this occurred after he got a shower.  Patient has been feeling weak and dizzy all morning.  He denies any vomiting or diarrhea.  Does have a history of chronic kidney disease.  No fevers or chills.  No trauma when he passed out.  No reported seizure activity.  EMS called and patient's blood pressure was 86/59.  Denies any new medication use.  Given IV fluids and transported here.      Past Medical History:  Diagnosis Date   ABNORMAL ELECTROCARDIOGRAM 06/21/2007   ALLERGIC RHINITIS 03/23/2007   Anxiety state, unspecified 09/06/2013   ASTHMATIC BRONCHITIS, ACUTE 04/27/2007   BENIGN PROSTATIC HYPERTROPHY 03/23/2007   BRONCHITIS NOT SPECIFIED AS ACUTE OR CHRONIC 04/21/2007   BURSITIS, RIGHT HIP 07/31/2008   COPD (chronic obstructive pulmonary disease) ([place]) 04/19/2016   Cough 01/29/2009   Dementia ([place]) 09/01/2010   ERECTILE DYSFUNCTION 03/23/2007   ERECTILE DYSFUNCTION, ORGANIC 04/17/2009   Esophageal stricture    FREQUENCY, URINARY 04/26/2010   GERD 03/23/2007   HIATAL HERNIA    HYPERLIPIDEMIA 03/23/2007   HYPERTENSION 03/23/2007   MILD COGNITIVE IMPAIRMENT SO STATED 05/19/2010   NEPHROLITHIASIS, HX OF 03/23/2007   PSA, INCREASED 06/27/2008   RASH-NONVESICULAR 03/23/2007   REACTIVE AIRWAY DISEASE 01/14/2010   SCHATZKI'S RING    SCIATICA, RIGHT 04/28/2008   Unspecified hypothyroidism 09/06/2013   Wheezing 04/17/2009    Patient Active Problem List   Diagnosis Date Noted   Pain in joint of left shoulder 01/23/2020   Bilateral shoulder pain 11/22/2019   Myalgia 11/22/2019   Gait disorder 11/22/2019   Chronic pain of right thumb 05/17/2019   Primary osteoarthritis of  first carpometacarpal joint of right hand 05/17/2019   Trigger finger of right thumb 05/17/2019   Corneal epithelial basement membrane dystrophy 03/04/2019   Keratoconjunctivitis sicca of both eyes not specified as Sjogren's 03/04/2019   Posterior vitreous detachment of both eyes 03/04/2019   Presbyopia of both eyes 03/04/2019   Pseudophakia of both eyes 03/04/2019   Febrile illness 12/27/2018   Choledocholithiasis 12/27/2018   Low back pain 07/11/2017   Non compliance w medication regimen 08/23/2016   Peripheral neuropathy 08/23/2016   COPD (chronic obstructive pulmonary disease) ([place]) 04/19/2016   Hyperglycemia 02/10/2016   Preventative health care 07/29/2015   CAP (community acquired pneumonia) 05/30/2015   Abnormal CXR 05/30/2015   Cough 04/22/2015   Asthma with exacerbation 04/22/2015   Urine abnormality 04/22/2015   Abdominal pain 09/11/2014   Abnormal liver function test 09/11/2014   Dysphagia 07/29/2014   Anxiety state 09/06/2013   CKD (chronic kidney disease) 09/06/2013   Hypothyroidism 09/06/2013   Rash and nonspecific skin eruption 02/27/2013   Dizziness and giddiness 01/04/2013   Abnormal TSH 06/04/2012   Gross hematuria 01/09/2012   Bruising 06/05/2011   Dementia ([place]) 09/01/2010   Cough variant asthma vs UACS  01/14/2010   ERECTILE DYSFUNCTION, ORGANIC 04/17/2009   FATIGUE 06/27/2008   PSA, INCREASED 06/27/2008   SCIATICA, RIGHT 04/28/2008   SCHATZKI'S RING 07/18/2007   HIATAL HERNIA 07/18/2007   ABNORMAL ELECTROCARDIOGRAM 06/21/2007   Hyperlipidemia 03/23/2007   Essential hypertension 03/23/2007   Allergic rhinitis 03/23/2007   GERD  03/23/2007   BENIGN PROSTATIC HYPERTROPHY 03/23/2007   NEPHROLITHIASIS, HX OF 03/23/2007    Past Surgical History:  Procedure Laterality Date   ERCP N/A 12/28/2018   Procedure: ENDOSCOPIC RETROGRADE CHOLANGIOPANCREATOGRAPHY (ERCP);  Surgeon: Ladene Artist, MD;  Location: [facility];  Service: Endoscopy;  Laterality:  N/A;   REMOVAL OF STONES  12/28/2018   Procedure: REMOVAL OF STONES;  Surgeon: Ladene Artist, MD;  Location: [facility];  Service: Endoscopy;;  balloon sweep   SPHINCTEROTOMY  12/28/2018   Procedure: Joan Mayans;  Surgeon: Ladene Artist, MD;  Location: [facility];  Service: Endoscopy;;   TONSILLECTOMY         Family History  Problem Relation Age of Onset   Lung cancer Brother        smoked   Cancer Other        lung cancer   Hypertension Other    Emphysema Brother        smoked   Asthma Sister     Social History   Tobacco Use   Smoking status: Never   Smokeless tobacco: Never   Tobacco comments:    tried smoking in HS  Vaping Use   Vaping Use: Never used  Substance Use Topics   Alcohol use: No   Drug use: No    Home Medications Prior to Admission medications   Medication Sig Start Date End Date Taking? Authorizing Provider  albuterol (VENTOLIN HFA) 108 (90 Base) MCG/ACT inhaler Inhale 2 puffs into the lungs every 6 (six) hours as needed for wheezing or shortness of breath. 09/22/20   Biagio Borg, MD  ALPRAZolam Duanne Moron) 0.5 MG tablet 1/2 - 1 tab by mouth twice per day as needed 09/22/20   Biagio Borg, MD  Cholecalciferol (VITAMIN D) 1000 UNITS capsule Take 1,000 Units by mouth every other day.     [provider]  desoximetasone (TOPICORT) 0.25 % cream APPLY SPARINGLY TO THE AFFECTED AREA TWICE DAILY AS NEEDED AS DIRECTED Patient taking differently: Apply 1 application topically 2 (two) times daily. Itching 04/30/18   Biagio Borg, MD  Fluticasone Propionate, Inhal, (FLOVENT DISKUS) 250 MCG/BLIST AEPB 1 puff twice per day 12/24/20   Biagio Borg, MD  lansoprazole (PREVACID) 30 MG capsule TAKE 1 CAPSULE BY MOUTH ONCE DAILY AT  12  NOON 02/22/21   Biagio Borg, MD  levothyroxine (SYNTHROID) 112 MCG tablet TAKE 1 TABLET(112 MCG) BY MOUTH DAILY 11/03/20   Biagio Borg, MD  losartan (COZAAR) 100 MG tablet Take 1 tablet by mouth once daily 03/02/21    Biagio Borg, MD  Multiple Vitamins-Minerals (CENTRUM SILVER PO) Take 1 tablet by mouth daily.     [provider]  polyethylene glycol powder (MIRALAX) powder Take 1/2 capful by mouth once daily Patient taking differently: Take 17 g by mouth 2 (two) times a week. 01/28/15   Biagio Borg, MD  tamsulosin (FLOMAX) 0.4 MG CAPS capsule TAKE 1 CAPSULE(0.4 MG) BY MOUTH AT BEDTIME 12/24/20   Biagio Borg, MD  triamcinolone cream (KENALOG) 0.1 % Apply 1 application topically 2 (two) times daily. 12/24/20 12/24/21  Biagio Borg, MD  vitamin B-12 (CYANOCOBALAMIN) 100 MCG tablet Take 100 mcg by mouth once a week.    [provider]  vitamin C (ASCORBIC ACID) 500 MG tablet Take 1,000 mg by mouth daily.     [provider]    Allergies    Atorvastatin, Doxazosin mesylate, and Hydrocodone bit-homatrop mbr  Review  of Systems   Review of Systems  All other systems reviewed and are negative.  Physical Exam Updated Vital Signs BP 136/84    Pulse 78    Temp 97.6 F (36.4 C) (Oral)    Resp 16    SpO2 100%   Physical Exam Vitals and nursing note reviewed.  Constitutional:      General: He is not in acute distress.    Appearance: Normal appearance. He is well-developed. He is not toxic-appearing.  HENT:     Head: Normocephalic and atraumatic.  Eyes:     General: Lids are normal.     Conjunctiva/sclera: Conjunctivae normal.     Pupils: Pupils are equal, round, and reactive to light.  Neck:     Thyroid: No thyroid mass.     Trachea: No tracheal deviation.  Cardiovascular:     Rate and Rhythm: Normal rate and regular rhythm.     Heart sounds: Normal heart sounds. No murmur heard.   No gallop.  Pulmonary:     Effort: Pulmonary effort is normal. No respiratory distress.     Breath sounds: Normal breath sounds. No stridor. No decreased breath sounds, wheezing, rhonchi or rales.  Abdominal:     General: There is no distension.     Palpations: Abdomen is soft.      Tenderness: There is no abdominal tenderness. There is no rebound.  Musculoskeletal:        General: No tenderness. Normal range of motion.     Cervical back: Normal range of motion and neck supple.  Skin:    General: Skin is warm and dry.     Findings: No abrasion or rash.  Neurological:     Mental Status: He is alert and oriented to person, place, and time. Mental status is at baseline.     GCS: GCS eye subscore is 4. GCS verbal subscore is 5. GCS motor subscore is 6.     Cranial Nerves: No cranial nerve deficit.     Sensory: No sensory deficit.     Motor: Motor function is intact.  Psychiatric:        Attention and Perception: Attention normal.        Speech: Speech normal.        Behavior: Behavior normal.    ED Results / Procedures / Treatments   Labs (all labs ordered are listed, but only abnormal results are displayed) Labs Reviewed  BASIC METABOLIC PANEL - Abnormal; Notable for the following components:      Result Value   Glucose, Bld 117 (*)    BUN 38 (*)    Creatinine, Ser 2.56 (*)    GFR, Estimated 23 (*)    All other components within normal limits  CBC - Abnormal; Notable for the following components:   RBC 3.84 (*)    Hemoglobin 12.2 (*)    HCT 37.9 (*)    All other components within normal limits  CBG MONITORING, ED - Abnormal; Notable for the following components:   Glucose-Capillary 111 (*)    All other components within normal limits  RESP PANEL BY RT-PCR (FLU A&B, COVID) ARPGX2  URINALYSIS, ROUTINE W REFLEX MICROSCOPIC    EKG EKG Interpretation  Date/Time:  Sunday March 28 2021 11:48:15 EST Ventricular Rate:  68 PR Interval:  170 QRS Duration: 94 QT Interval:  384 QTC Calculation: 409 R Axis:   25 Text Interpretation: Sinus rhythm Atrial premature complex Confirmed by Lacretia Leigh ([id number]) on 03/28/2021 1:49:47 PM  Radiology  No results found.  Procedures Procedures   Medications Ordered in ED Medications  lactated ringers infusion  ( Intravenous New Bag/Given 03/28/21 1241)  lactated ringers bolus 1,000 mL (0 mLs Intravenous Stopped 03/28/21 1350)    ED Course  I have reviewed the triage vital signs and the nursing notes.  Pertinent labs & imaging results that were available during my care of the patient were reviewed by me and considered in my medical decision making (see chart for details).    MDM Rules/Calculators/A&P                         Patient's creatinine is 2.56 here.  Last creatinine 6 months ago was about 2.1.  He was IV hydrated here and feels much better at this time.  He was initially orthostatic and those were repeated by me and that is since resolved.  States he feels well not to go home.  Patient does take losartan which could be affecting his creatinine.  He has a nephrologist and I have encouraged him to follow-up with them.  I will have him hold his losartan until he talks to his nephrologist  CRITICAL CARE Performed by: Leota Jacobsen Total critical care time: 50 minutes Critical care time was exclusive of separately billable procedures and treating other patients. Critical care was necessary to treat or prevent imminent or life-threatening deterioration. Critical care was time spent personally by me on the following activities: development of treatment plan with patient and/or surrogate as well as nursing, discussions with consultants, evaluation of patient's response to treatment, examination of patient, obtaining history from patient or surrogate, ordering and performing treatments and interventions, ordering and review of laboratory studies, ordering and review of radiographic studies, pulse oximetry and re-evaluation of patient's condition.     Final Clinical Impression(s) / ED Diagnoses Final diagnoses:  None    Rx / DC Orders ED Discharge Orders     None        Lacretia Leigh, MD 03/28/21 1448

## 2021-03-28 NOTE — Discharge Instructions (Signed)
Do not take your losartan until you speak with your kidney doctor

## 2021-04-06 DIAGNOSIS — I129 Hypertensive chronic kidney disease with stage 1 through stage 4 chronic kidney disease, or unspecified chronic kidney disease: Secondary | ICD-10-CM | POA: Diagnosis not present

## 2021-04-06 DIAGNOSIS — D631 Anemia in chronic kidney disease: Secondary | ICD-10-CM | POA: Diagnosis not present

## 2021-04-06 DIAGNOSIS — E559 Vitamin D deficiency, unspecified: Secondary | ICD-10-CM | POA: Diagnosis not present

## 2021-04-06 DIAGNOSIS — N400 Enlarged prostate without lower urinary tract symptoms: Secondary | ICD-10-CM | POA: Diagnosis not present

## 2021-04-06 DIAGNOSIS — E875 Hyperkalemia: Secondary | ICD-10-CM | POA: Diagnosis not present

## 2021-04-06 DIAGNOSIS — N184 Chronic kidney disease, stage 4 (severe): Secondary | ICD-10-CM | POA: Diagnosis not present

## 2021-04-12 ENCOUNTER — Other Ambulatory Visit: Payer: Self-pay | Admitting: Internal Medicine

## 2021-04-12 NOTE — Telephone Encounter (Signed)
Please refill as per office routine med refill policy (all routine meds to be refilled for 3 mo or monthly (per pt preference) up to one year from last visit, then month to month grace period for 3 mo, then further med refills will have to be denied) ? ?

## 2021-04-16 ENCOUNTER — Encounter: Payer: Self-pay | Admitting: Internal Medicine

## 2021-04-16 ENCOUNTER — Telehealth: Payer: Self-pay | Admitting: Internal Medicine

## 2021-04-16 ENCOUNTER — Other Ambulatory Visit: Payer: Self-pay

## 2021-04-16 ENCOUNTER — Ambulatory Visit (INDEPENDENT_AMBULATORY_CARE_PROVIDER_SITE_OTHER): Payer: PPO | Admitting: Internal Medicine

## 2021-04-16 VITALS — BP 122/72 | HR 75 | Temp 97.8°F | Ht 72.0 in | Wt 193.0 lb

## 2021-04-16 DIAGNOSIS — I10 Essential (primary) hypertension: Secondary | ICD-10-CM | POA: Diagnosis not present

## 2021-04-16 DIAGNOSIS — N184 Chronic kidney disease, stage 4 (severe): Secondary | ICD-10-CM | POA: Diagnosis not present

## 2021-04-16 DIAGNOSIS — E7800 Pure hypercholesterolemia, unspecified: Secondary | ICD-10-CM

## 2021-04-16 DIAGNOSIS — Z0000 Encounter for general adult medical examination without abnormal findings: Secondary | ICD-10-CM

## 2021-04-16 DIAGNOSIS — E559 Vitamin D deficiency, unspecified: Secondary | ICD-10-CM

## 2021-04-16 MED ORDER — CHOLECALCIFEROL 50 MCG (2000 UT) PO TABS: ORAL_TABLET | ORAL | 99 refills | Status: DC

## 2021-04-16 MED ORDER — ROSUVASTATIN CALCIUM 10 MG PO TABS: 10.0000 mg | ORAL_TABLET | Freq: Every day | ORAL | 3 refills | Status: DC

## 2021-04-16 MED ORDER — LOSARTAN POTASSIUM 100 MG PO TABS
50.0000 mg | ORAL_TABLET | Freq: Every day | ORAL | 3 refills | Status: DC
Start: 2021-04-16 — End: 2021-04-16

## 2021-04-16 MED ORDER — LOSARTAN POTASSIUM 100 MG PO TABS: 50.0000 mg | ORAL_TABLET | Freq: Every day | ORAL | 3 refills | Status: DC

## 2021-04-16 NOTE — Telephone Encounter (Signed)
Verbal auth given

## 2021-04-16 NOTE — Patient Instructions (Signed)
Please take all new medication as prescribed - the crestor 10 mg per day  Please take OTC Vitamin D3 at 2000 units per day, indefinitely  Be sure to get up slowly as you could fall down with low BP if you stand too quickly  Please continue all other medications as before, and refills have been done if requested.  Please have the pharmacy call with any other refills you may need.  Please continue your efforts at being more active, low cholesterol diet, and weight control.  You are otherwise up to date with prevention measures today.  Please keep your appointments with your specialists as you may have planned  Please make an Appointment to return in 3 months, or sooner if needed

## 2021-04-16 NOTE — Assessment & Plan Note (Signed)
Uncontrolled  Lab Results  Component Value Date   LDLCALC 137 (H) 11/22/2019   Pt with lipitor intolerance, but willing to try crestor 10

## 2021-04-16 NOTE — Progress Notes (Signed)
Patient ID: Stephen Wilkins, male   DOB: 11-03-1927, 88 y.o.   MRN: [id number]         Chief Complaint:: wellness exam and Office Visit (Patient c/o having some dizziness. BP medication has been cut in half by kidney doctor)         HPI:  Stephen Wilkins is a 88 y.o. male here for wellness exam; decliens covid booster, shingrix, o/w up to date                        Also lower BP improved today after recent decreased losartan to 50 mg; has regular renal f/u, gets dizzy with standing too quickly form lying down, not taking Vit d, but willing to start as well as statin for cholesterol.  Pt denies chest pain, increased sob or doe, wheezing, orthopnea, PND, increased LE swelling, palpitations, dizziness or syncope.  Pt denies polydipsia, polyuria    Pt denies fever, wt loss, night sweats, loss of appetite, or other constitutional symptoms  Wt Readings from Last 3 Encounters:  04/16/21 193 lb (87.5 kg)  12/24/20 197 lb (89.4 kg)  09/22/20 199 lb 12.8 oz (90.6 kg)   BP Readings from Last 3 Encounters:  04/16/21 122/72  03/28/21 119/70  12/24/20 116/72   Immunization History  Administered Date(s) Administered   Fluad Quad(high Dose 65+) 12/29/2018, 01/07/2020, 12/24/2020   Influenza Split 01/09/2012   Influenza Whole 03/23/2007, 02/02/2009, 01/13/2010   Influenza, High Dose Seasonal PF 02/07/2014, 01/28/2015, 02/28/2017, 01/19/2018   Influenza,inj,Quad PF,6+ Mos 12/05/2012   Moderna Sars-Covid-2 Vaccination 04/16/2019, 05/14/2019, 02/18/2020, 08/01/2020   Pneumococcal Conjugate-13 06/05/2013   Pneumococcal Polysaccharide-23 03/23/2007   Td 07/31/2008   Tdap 01/09/2019   There are no preventive care reminders to display for this patient.     Past Medical History:  Diagnosis Date   ABNORMAL ELECTROCARDIOGRAM 06/21/2007   ALLERGIC RHINITIS 03/23/2007   Anxiety state, unspecified 09/06/2013   ASTHMATIC BRONCHITIS, ACUTE 04/27/2007   BENIGN PROSTATIC HYPERTROPHY 03/23/2007   BRONCHITIS NOT  SPECIFIED AS ACUTE OR CHRONIC 04/21/2007   BURSITIS, RIGHT HIP 07/31/2008   COPD (chronic obstructive pulmonary disease) ([place]) 04/19/2016   Cough 01/29/2009   Dementia ([place]) 09/01/2010   ERECTILE DYSFUNCTION 03/23/2007   ERECTILE DYSFUNCTION, ORGANIC 04/17/2009   Esophageal stricture    FREQUENCY, URINARY 04/26/2010   GERD 03/23/2007   HIATAL HERNIA    HYPERLIPIDEMIA 03/23/2007   HYPERTENSION 03/23/2007   MILD COGNITIVE IMPAIRMENT SO STATED 05/19/2010   NEPHROLITHIASIS, HX OF 03/23/2007   PSA, INCREASED 06/27/2008   RASH-NONVESICULAR 03/23/2007   REACTIVE AIRWAY DISEASE 01/14/2010   SCHATZKI'S RING    SCIATICA, RIGHT 04/28/2008   Unspecified hypothyroidism 09/06/2013   Wheezing 04/17/2009   Past Surgical History:  Procedure Laterality Date   ERCP N/A 12/28/2018   Procedure: ENDOSCOPIC RETROGRADE CHOLANGIOPANCREATOGRAPHY (ERCP);  Surgeon: Ladene Artist, MD;  Location: [facility];  Service: Endoscopy;  Laterality: N/A;   REMOVAL OF STONES  12/28/2018   Procedure: REMOVAL OF STONES;  Surgeon: Ladene Artist, MD;  Location: [facility];  Service: Endoscopy;;  balloon sweep   SPHINCTEROTOMY  12/28/2018   Procedure: Joan Mayans;  Surgeon: Ladene Artist, MD;  Location: [facility];  Service: Endoscopy;;   TONSILLECTOMY      reports that he has never smoked. He has never used smokeless tobacco. He reports that he does not drink alcohol and does not use drugs. family history includes Asthma in his sister; Cancer in an other  family member; Emphysema in his brother; Hypertension in an other family member; Lung cancer in his brother. Allergies  Allergen Reactions   Atorvastatin     REACTION: myalgia   Doxazosin Mesylate     REACTION: dizziness   Hydrocodone Bit-Homatrop Mbr Other (See Comments)    anxiety   Current Outpatient Medications on File Prior to Visit  Medication Sig Dispense Refill   albuterol (VENTOLIN HFA) 108 (90 Base) MCG/ACT inhaler Inhale 2 puffs into the lungs every  6 (six) hours as needed for wheezing or shortness of breath. 8 g 11   ALPRAZolam (XANAX) 0.5 MG tablet 1/2 - 1 tab by mouth twice per day as needed 60 tablet 2   Cholecalciferol (VITAMIN D) 1000 UNITS capsule Take 1,000 Units by mouth every other day.      desoximetasone (TOPICORT) 0.25 % cream APPLY SPARINGLY TO THE AFFECTED AREA TWICE DAILY AS NEEDED AS DIRECTED (Patient taking differently: Apply 1 application topically 2 (two) times daily. Itching) 30 g 1   Fluticasone Propionate, Inhal, (FLOVENT DISKUS) 250 MCG/BLIST AEPB 1 puff twice per day 60 each 11   lansoprazole (PREVACID) 30 MG capsule TAKE 1 CAPSULE BY MOUTH ONCE DAILY AT  12  NOON 90 capsule 2   levothyroxine (SYNTHROID) 112 MCG tablet Take 1 tablet by mouth once daily 30 tablet 0   Multiple Vitamins-Minerals (CENTRUM SILVER PO) Take 1 tablet by mouth daily.      polyethylene glycol powder (MIRALAX) powder Take 1/2 capful by mouth once daily (Patient taking differently: Take 17 g by mouth 2 (two) times a week.) 850 g 5   tamsulosin (FLOMAX) 0.4 MG CAPS capsule TAKE 1 CAPSULE(0.4 MG) BY MOUTH AT BEDTIME 90 capsule 3   triamcinolone cream (KENALOG) 0.1 % Apply 1 application topically 2 (two) times daily. 30 g 0   vitamin B-12 (CYANOCOBALAMIN) 100 MCG tablet Take 100 mcg by mouth once a week.     vitamin C (ASCORBIC ACID) 500 MG tablet Take 1,000 mg by mouth daily.      No current facility-administered medications on file prior to visit.        ROS:  All others reviewed and negative.  Objective        PE:  BP 122/72 (BP Location: Right Arm, Patient Position: Sitting, Cuff Size: Large)    Pulse 75    Temp 97.8 F (36.6 C) (Oral)    Ht 6' (1.829 m)    Wt 193 lb (87.5 kg)    SpO2 95%    BMI 26.18 kg/m                 Constitutional: Pt appears in NAD               HENT: Head: NCAT.                Right Ear: External ear normal.                 Left Ear: External ear normal.                Eyes: . Pupils are equal, round, and  reactive to light. Conjunctivae and EOM are normal               Nose: without d/c or deformity               Neck: Neck supple. Gross normal ROM  Cardiovascular: Normal rate and regular rhythm.                 Pulmonary/Chest: Effort normal and breath sounds without rales or wheezing.                Abd:  Soft, NT, ND, + BS, no organomegaly               Neurological: Pt is alert. At baseline orientation, motor grossly intact               Skin: Skin is warm. No rashes, no other new lesions, LE edema - none               Psychiatric: Pt behavior is normal without agitation   Micro: none  Cardiac tracings I have personally interpreted today:  none  Pertinent Radiological findings (summarize): none   Lab Results  Component Value Date   WBC 8.7 03/28/2021   HGB 12.2 (L) 03/28/2021   HCT 37.9 (L) 03/28/2021   PLT 234 03/28/2021   GLUCOSE 117 (H) 03/28/2021   CHOL 206 (H) 10/01/2020   TRIG 203.0 (H) 10/01/2020   HDL 40.00 10/01/2020   LDLDIRECT 138.0 10/01/2020   LDLCALC 137 (H) 11/22/2019   ALT 10 10/01/2020   AST 21 10/01/2020   NA 139 03/28/2021   K 4.7 03/28/2021   CL 107 03/28/2021   CREATININE 2.56 (H) 03/28/2021   BUN 38 (H) 03/28/2021   CO2 25 03/28/2021   TSH 9.65 (H) 10/01/2020   PSA 3.94 03/12/2014   HGBA1C 6.3 10/01/2020   Assessment/Plan:  KELVIN SENNETT is a 88 y.o. White or Caucasian [1] male with  has a past medical history of ABNORMAL ELECTROCARDIOGRAM (06/21/2007), ALLERGIC RHINITIS (03/23/2007), Anxiety state, unspecified (09/06/2013), ASTHMATIC BRONCHITIS, ACUTE (04/27/2007), BENIGN PROSTATIC HYPERTROPHY (03/23/2007), BRONCHITIS NOT SPECIFIED AS ACUTE OR CHRONIC (04/21/2007), BURSITIS, RIGHT HIP (07/31/2008), COPD (chronic obstructive pulmonary disease) ([place]) (04/19/2016), Cough (01/29/2009), Dementia ([place]) (09/01/2010), ERECTILE DYSFUNCTION (03/23/2007), ERECTILE DYSFUNCTION, ORGANIC (04/17/2009), Esophageal stricture, FREQUENCY, URINARY (04/26/2010), GERD  (03/23/2007), HIATAL HERNIA, HYPERLIPIDEMIA (03/23/2007), HYPERTENSION (03/23/2007), MILD COGNITIVE IMPAIRMENT SO STATED (05/19/2010), NEPHROLITHIASIS, HX OF (03/23/2007), PSA, INCREASED (06/27/2008), RASH-NONVESICULAR (03/23/2007), REACTIVE AIRWAY DISEASE (01/14/2010), SCHATZKI'S RING, SCIATICA, RIGHT (04/28/2008), Unspecified hypothyroidism (09/06/2013), and Wheezing (04/17/2009).  Vitamin D deficiency Last vitamin D Lab Results  Component Value Date   VD25OH 36.56 10/01/2020   Low, to start oral replacement   Hyperlipidemia Uncontrolled  Lab Results  Component Value Date   LDLCALC 137 (H) 11/22/2019   Pt with lipitor intolerance, but willing to try crestor 10    Preventative health care Age and sex appropriate education and counseling updated with regular exercise and diet Referrals for preventative services - none needed Immunizations addressed - declines covid booster, shingirx Smoking counseling  - none needed Evidence for depression or other mood disorder - none significant Most recent labs reviewed. I have personally reviewed and have noted: 1) the patient's medical and social history 2) The patient's current medications and supplements 3) The patient's height, weight, and BMI have been recorded in the chart   CKD (chronic kidney disease) Lab Results  Component Value Date   CREATININE 2.56 (H) 03/28/2021   Stable overall, cont to avoid nephrotoxins   Essential hypertension BP Readings from Last 3 Encounters:  04/16/21 122/72  03/28/21 119/70  12/24/20 116/72   Stable, pt to continue medical treatment *  Followup: Return in about 3 months (around 07/15/2021).  Cathlean Cower, MD 04/17/2021 4:23 PM [facility]

## 2021-04-16 NOTE — Assessment & Plan Note (Signed)
Last vitamin D Lab Results  Component Value Date   VD25OH 36.56 10/01/2020   Low, to start oral replacement

## 2021-04-16 NOTE — Telephone Encounter (Signed)
Pharmacy requesting verbal authorization to use a different manufacture for patient's levothyroxine (SYNTHROID) 112 MCG tablet rx  Please advise

## 2021-04-16 NOTE — Telephone Encounter (Signed)
Ok for verbal 

## 2021-04-17 ENCOUNTER — Encounter: Payer: Self-pay | Admitting: Internal Medicine

## 2021-04-17 NOTE — Assessment & Plan Note (Signed)
Age and sex appropriate education and counseling updated with regular exercise and diet Referrals for preventative services - none needed Immunizations addressed - declines covid booster, shingirx Smoking counseling  - none needed Evidence for depression or other mood disorder - none significant Most recent labs reviewed. I have personally reviewed and have noted: 1) the patient's medical and social history 2) The patient's current medications and supplements 3) The patient's height, weight, and BMI have been recorded in the chart

## 2021-04-17 NOTE — Assessment & Plan Note (Signed)
Lab Results  Component Value Date   CREATININE 2.56 (H) 03/28/2021   Stable overall, cont to avoid nephrotoxins

## 2021-04-17 NOTE — Assessment & Plan Note (Signed)
BP Readings from Last 3 Encounters:  04/16/21 122/72  03/28/21 119/70  12/24/20 116/72   Stable, pt to continue medical treatment *

## 2021-04-28 DIAGNOSIS — I129 Hypertensive chronic kidney disease with stage 1 through stage 4 chronic kidney disease, or unspecified chronic kidney disease: Secondary | ICD-10-CM | POA: Diagnosis not present

## 2021-06-29 DIAGNOSIS — N184 Chronic kidney disease, stage 4 (severe): Secondary | ICD-10-CM | POA: Diagnosis not present

## 2021-07-07 ENCOUNTER — Other Ambulatory Visit: Payer: Self-pay | Admitting: Internal Medicine

## 2021-07-07 NOTE — Telephone Encounter (Signed)
Please refill as per office routine med refill policy (all routine meds to be refilled for 3 mo or monthly (per pt preference) up to one year from last visit, then month to month grace period for 3 mo, then further med refills will have to be denied) ? ?

## 2021-07-15 ENCOUNTER — Encounter: Payer: Self-pay | Admitting: Internal Medicine

## 2021-07-15 ENCOUNTER — Ambulatory Visit (INDEPENDENT_AMBULATORY_CARE_PROVIDER_SITE_OTHER): Payer: PPO | Admitting: Internal Medicine

## 2021-07-15 VITALS — BP 120/60 | HR 79 | Temp 97.9°F | Ht 72.0 in | Wt 196.0 lb

## 2021-07-15 DIAGNOSIS — R42 Dizziness and giddiness: Secondary | ICD-10-CM | POA: Diagnosis not present

## 2021-07-15 DIAGNOSIS — N184 Chronic kidney disease, stage 4 (severe): Secondary | ICD-10-CM | POA: Diagnosis not present

## 2021-07-15 DIAGNOSIS — R739 Hyperglycemia, unspecified: Secondary | ICD-10-CM | POA: Diagnosis not present

## 2021-07-15 DIAGNOSIS — R269 Unspecified abnormalities of gait and mobility: Secondary | ICD-10-CM

## 2021-07-15 DIAGNOSIS — E559 Vitamin D deficiency, unspecified: Secondary | ICD-10-CM

## 2021-07-15 MED ORDER — ALBUTEROL SULFATE HFA 108 (90 BASE) MCG/ACT IN AERS: 2.0000 | INHALATION_SPRAY | Freq: Four times a day (QID) | RESPIRATORY_TRACT | 11 refills | Status: DC | PRN

## 2021-07-15 NOTE — Progress Notes (Signed)
Patient ID: Stephen Wilkins, male   DOB: Sep 01, 1927, 88 y.o.   MRN: [id number] ? ? ? ?    Chief Complaint: follow up vertigo, gait disorder, hyperglycemi, low vit d, ckd ? ?     HPI:  Stephen Wilkins is a 88 y.o. male here with c/o persistently recurring dizziness mostly vertigo it seems but also occasionally lightheaded as well. Only taking 1/2 losartan but not clear if helping.   Asks if flomax needs to be changed, but also states otc dramamine helped the dizziness.  Pt denies chest pain, increased sob or doe, wheezing, orthopnea, PND, increased LE swelling, palpitations, or syncope.Has had several near falls due worsening off balance.      Pt denies polydipsia, polyuria, or new focal neuro s/s.   Pt denies fever, wt loss, night sweats, loss of appetite, or other constitutional symptoms  ?Wt Readings from Last 3 Encounters:  ?07/15/21 196 lb (88.9 kg)  ?04/16/21 193 lb (87.5 kg)  ?12/24/20 197 lb (89.4 kg)  ? ?BP Readings from Last 3 Encounters:  ?07/15/21 120/60  ?04/16/21 122/72  ?03/28/21 119/70  ? ?      ?Past Medical History:  ?Diagnosis Date  ? ABNORMAL ELECTROCARDIOGRAM 06/21/2007  ? ALLERGIC RHINITIS 03/23/2007  ? Anxiety state, unspecified 09/06/2013  ? ASTHMATIC BRONCHITIS, ACUTE 04/27/2007  ? BENIGN PROSTATIC HYPERTROPHY 03/23/2007  ? BRONCHITIS NOT SPECIFIED AS ACUTE OR CHRONIC 04/21/2007  ? BURSITIS, RIGHT HIP 07/31/2008  ? COPD (chronic obstructive pulmonary disease) ([place]) 04/19/2016  ? Cough 01/29/2009  ? Dementia ([place]) 09/01/2010  ? ERECTILE DYSFUNCTION 03/23/2007  ? ERECTILE DYSFUNCTION, ORGANIC 04/17/2009  ? Esophageal stricture   ? FREQUENCY, URINARY 04/26/2010  ? GERD 03/23/2007  ? HIATAL HERNIA   ? HYPERLIPIDEMIA 03/23/2007  ? HYPERTENSION 03/23/2007  ? MILD COGNITIVE IMPAIRMENT SO STATED 05/19/2010  ? NEPHROLITHIASIS, HX OF 03/23/2007  ? PSA, INCREASED 06/27/2008  ? RASH-NONVESICULAR 03/23/2007  ? REACTIVE AIRWAY DISEASE 01/14/2010  ? SCHATZKI'S RING   ? SCIATICA, RIGHT 04/28/2008  ? Unspecified hypothyroidism  09/06/2013  ? Wheezing 04/17/2009  ? ?Past Surgical History:  ?Procedure Laterality Date  ? ERCP N/A 12/28/2018  ? Procedure: ENDOSCOPIC RETROGRADE CHOLANGIOPANCREATOGRAPHY (ERCP);  Surgeon: Ladene Artist, MD;  Location: [facility];  Service: Endoscopy;  Laterality: N/A;  ? REMOVAL OF STONES  12/28/2018  ? Procedure: REMOVAL OF STONES;  Surgeon: Ladene Artist, MD;  Location: [facility];  Service: Endoscopy;;  balloon sweep  ? SPHINCTEROTOMY  12/28/2018  ? Procedure: SPHINCTEROTOMY;  Surgeon: Ladene Artist, MD;  Location: [facility];  Service: Endoscopy;;  ? TONSILLECTOMY    ? ? reports that he has never smoked. He has never used smokeless tobacco. He reports that he does not drink alcohol and does not use drugs. ?family history includes Asthma in his sister; Cancer in an other family member; Emphysema in his brother; Hypertension in an other family member; Lung cancer in his brother. ?Allergies  ?Allergen Reactions  ? Atorvastatin   ?  REACTION: myalgia  ? Doxazosin Mesylate   ?  REACTION: dizziness  ? Hydrocodone Bit-Homatrop Mbr Other (See Comments)  ?  anxiety  ? ?Current Outpatient Medications on File Prior to Visit  ?Medication Sig Dispense Refill  ? ALPRAZolam (XANAX) 0.5 MG tablet 1/2 - 1 tab by mouth twice per day as needed 60 tablet 2  ? Cholecalciferol (VITAMIN D) 1000 UNITS capsule Take 1,000 Units by mouth every other day.     ? Cholecalciferol 50 MCG (2000 UT)  TABS 1 tab by mouth once daily 30 tablet 99  ? desoximetasone (TOPICORT) 0.25 % cream APPLY SPARINGLY TO THE AFFECTED AREA TWICE DAILY AS NEEDED AS DIRECTED (Patient taking differently: Apply 1 application. topically 2 (two) times daily. Itching) 30 g 1  ? Fluticasone Propionate, Inhal, (FLOVENT DISKUS) 250 MCG/BLIST AEPB 1 puff twice per day 60 each 11  ? lansoprazole (PREVACID) 30 MG capsule TAKE 1 CAPSULE BY MOUTH ONCE DAILY AT  12  NOON 90 capsule 2  ? levothyroxine (SYNTHROID) 112 MCG tablet Take 1 tablet by mouth once daily 90  tablet 3  ? losartan (COZAAR) 100 MG tablet Take 0.5 tablets (50 mg total) by mouth daily. 45 tablet 3  ? Multiple Vitamins-Minerals (CENTRUM SILVER PO) Take 1 tablet by mouth daily.     ? polyethylene glycol powder (MIRALAX) powder Take 1/2 capful by mouth once daily (Patient taking differently: Take 17 g by mouth 2 (two) times a week.) 850 g 5  ? rosuvastatin (CRESTOR) 10 MG tablet Take 1 tablet (10 mg total) by mouth daily. 90 tablet 3  ? tamsulosin (FLOMAX) 0.4 MG CAPS capsule TAKE 1 CAPSULE(0.4 MG) BY MOUTH AT BEDTIME 90 capsule 3  ? triamcinolone cream (KENALOG) 0.1 % Apply 1 application topically 2 (two) times daily. 30 g 0  ? vitamin B-12 (CYANOCOBALAMIN) 100 MCG tablet Take 100 mcg by mouth once a week.    ? vitamin C (ASCORBIC ACID) 500 MG tablet Take 1,000 mg by mouth daily.     ? ?No current facility-administered medications on file prior to visit.  ? ?     ROS:  All others reviewed and negative. ? ?Objective  ? ?     PE:  BP 120/60 (BP Location: Right Arm, Patient Position: Sitting, Cuff Size: Large)   Pulse 79   Temp 97.9 ?F (36.6 ?C) (Oral)   Ht 6' (1.829 m)   Wt 196 lb (88.9 kg)   SpO2 96%   BMI 26.58 kg/m?  ? ?              Constitutional: Pt appears in NAD ?              HENT: Head: NCAT.  ?              Right Ear: External ear normal.   ?              Left Ear: External ear normal.  ?              Eyes: . Pupils are equal, round, and reactive to light. Conjunctivae and EOM are normal ?              Nose: without d/c or deformity ?              Neck: Neck supple. Gross normal ROM ?              Cardiovascular: Normal rate and regular rhythm.   ?              Pulmonary/Chest: Effort normal and breath sounds without rales or wheezing.  ?              Abd:  Soft, NT, ND, + BS, no organomegaly ?              Neurological: Pt is alert. At baseline orientation, motor grossly intact ?              Skin: Skin is  warm. No rashes, no other new lesions, LE edema - none ?              Psychiatric: Pt  behavior is normal without agitation  ? ?Micro: none ? ?Cardiac tracings I have personally interpreted today:  none ? ?Pertinent Radiological findings (summarize): none  ? ?Lab Results  ?Component Value Date  ? WBC 8.7 03/28/2021  ? HGB 12.2 (L) 03/28/2021  ? HCT 37.9 (L) 03/28/2021  ? PLT 234 03/28/2021  ? GLUCOSE 117 (H) 03/28/2021  ? CHOL 206 (H) 10/01/2020  ? TRIG 203.0 (H) 10/01/2020  ? HDL 40.00 10/01/2020  ? LDLDIRECT 138.0 10/01/2020  ? LDLCALC 137 (H) 11/22/2019  ? ALT 10 10/01/2020  ? AST 21 10/01/2020  ? NA 139 03/28/2021  ? K 4.7 03/28/2021  ? CL 107 03/28/2021  ? CREATININE 2.56 (H) 03/28/2021  ? BUN 38 (H) 03/28/2021  ? CO2 25 03/28/2021  ? TSH 9.65 (H) 10/01/2020  ? PSA 3.94 03/12/2014  ? HGBA1C 6.3 10/01/2020  ? ?Assessment/Plan:  ?MASE DHONDT is a 88 y.o. White or Caucasian [1] male with  has a past medical history of ABNORMAL ELECTROCARDIOGRAM (06/21/2007), ALLERGIC RHINITIS (03/23/2007), Anxiety state, unspecified (09/06/2013), ASTHMATIC BRONCHITIS, ACUTE (04/27/2007), BENIGN PROSTATIC HYPERTROPHY (03/23/2007), BRONCHITIS NOT SPECIFIED AS ACUTE OR CHRONIC (04/21/2007), BURSITIS, RIGHT HIP (07/31/2008), COPD (chronic obstructive pulmonary disease) ([place]) (04/19/2016), Cough (01/29/2009), Dementia ([place]) (09/01/2010), ERECTILE DYSFUNCTION (03/23/2007), ERECTILE DYSFUNCTION, ORGANIC (04/17/2009), Esophageal stricture, FREQUENCY, URINARY (04/26/2010), GERD (03/23/2007), HIATAL HERNIA, HYPERLIPIDEMIA (03/23/2007), HYPERTENSION (03/23/2007), MILD COGNITIVE IMPAIRMENT SO STATED (05/19/2010), NEPHROLITHIASIS, HX OF (03/23/2007), PSA, INCREASED (06/27/2008), RASH-NONVESICULAR (03/23/2007), REACTIVE AIRWAY DISEASE (01/14/2010), SCHATZKI'S RING, SCIATICA, RIGHT (04/28/2008), Unspecified hypothyroidism (09/06/2013), and Wheezing (04/17/2009). ? ?CKD (chronic kidney disease) ?Lab Results  ?Component Value Date  ? CREATININE 2.56 (H) 03/28/2021  ? ?Stable overall, cont to avoid nephrotoxins ? ? ?Gait disorder ?Also for standard  walker rx,  to f/u any worsening symptoms or concerns, declines PT referral ? ?Hyperglycemia ?Lab Results  ?Component Value Date  ? HGBA1C 6.3 10/01/2020  ? ?Stable, pt to continue current medical treatment - diet

## 2021-07-15 NOTE — Patient Instructions (Signed)
Ok to use the OTC dramamine for the dizziness ? ?You are given the prescription for the Standard Walker today ? ?Please continue all other medications as before, and refills have been done if requested. ? ?Please have the pharmacy call with any other refills you may need. ? ?Please continue your efforts at being more active, low cholesterol diet, and weight control ? ?Please keep your appointments with your specialists as you may have planned ? ?Please make an Appointment to return in 3 months, or sooner if needed ? ?

## 2021-07-20 ENCOUNTER — Encounter: Payer: Self-pay | Admitting: Internal Medicine

## 2021-07-20 NOTE — Assessment & Plan Note (Signed)
Ok for otc dramamine as this seems to have helped recently ?

## 2021-07-20 NOTE — Assessment & Plan Note (Signed)
Last vitamin D ?Lab Results  ?Component Value Date  ? VD25OH 36.56 10/01/2020  ? ?Low, reminded to start oral replacement ? ?

## 2021-07-20 NOTE — Assessment & Plan Note (Signed)
Lab Results  ?Component Value Date  ? CREATININE 2.56 (H) 03/28/2021  ? ?Stable overall, cont to avoid nephrotoxins ? ?

## 2021-07-20 NOTE — Assessment & Plan Note (Signed)
Lab Results  ?Component Value Date  ? HGBA1C 6.3 10/01/2020  ? ?Stable, pt to continue current medical treatment - diet ? ?

## 2021-07-20 NOTE — Assessment & Plan Note (Signed)
Also for standard walker rx,  to f/u any worsening symptoms or concerns, declines PT referral ?

## 2021-08-09 DIAGNOSIS — N184 Chronic kidney disease, stage 4 (severe): Secondary | ICD-10-CM | POA: Diagnosis not present

## 2021-08-09 DIAGNOSIS — E875 Hyperkalemia: Secondary | ICD-10-CM | POA: Diagnosis not present

## 2021-08-09 DIAGNOSIS — N400 Enlarged prostate without lower urinary tract symptoms: Secondary | ICD-10-CM | POA: Diagnosis not present

## 2021-08-09 DIAGNOSIS — I129 Hypertensive chronic kidney disease with stage 1 through stage 4 chronic kidney disease, or unspecified chronic kidney disease: Secondary | ICD-10-CM | POA: Diagnosis not present

## 2021-08-09 DIAGNOSIS — D631 Anemia in chronic kidney disease: Secondary | ICD-10-CM | POA: Diagnosis not present

## 2021-08-12 DIAGNOSIS — B351 Tinea unguium: Secondary | ICD-10-CM | POA: Diagnosis not present

## 2021-10-14 ENCOUNTER — Ambulatory Visit: Payer: PPO | Admitting: Internal Medicine

## 2021-10-21 ENCOUNTER — Encounter: Payer: Self-pay | Admitting: Internal Medicine

## 2021-10-21 ENCOUNTER — Ambulatory Visit (INDEPENDENT_AMBULATORY_CARE_PROVIDER_SITE_OTHER): Payer: PPO | Admitting: Internal Medicine

## 2021-10-21 VITALS — BP 142/80 | HR 74 | Temp 97.9°F | Ht 72.0 in | Wt 192.4 lb

## 2021-10-21 DIAGNOSIS — R739 Hyperglycemia, unspecified: Secondary | ICD-10-CM

## 2021-10-21 DIAGNOSIS — R5383 Other fatigue: Secondary | ICD-10-CM | POA: Insufficient documentation

## 2021-10-21 DIAGNOSIS — R350 Frequency of micturition: Secondary | ICD-10-CM

## 2021-10-21 DIAGNOSIS — N401 Enlarged prostate with lower urinary tract symptoms: Secondary | ICD-10-CM | POA: Diagnosis not present

## 2021-10-21 DIAGNOSIS — E538 Deficiency of other specified B group vitamins: Secondary | ICD-10-CM | POA: Diagnosis not present

## 2021-10-21 DIAGNOSIS — E559 Vitamin D deficiency, unspecified: Secondary | ICD-10-CM

## 2021-10-21 DIAGNOSIS — E7800 Pure hypercholesterolemia, unspecified: Secondary | ICD-10-CM | POA: Diagnosis not present

## 2021-10-21 DIAGNOSIS — I10 Essential (primary) hypertension: Secondary | ICD-10-CM | POA: Diagnosis not present

## 2021-10-21 DIAGNOSIS — R011 Cardiac murmur, unspecified: Secondary | ICD-10-CM

## 2021-10-21 LAB — CBC WITH DIFFERENTIAL/PLATELET
Basophils Absolute: 0 10*3/uL (ref 0.0–0.1)
Basophils Relative: 0.7 % (ref 0.0–3.0)
Eosinophils Absolute: 0.2 10*3/uL (ref 0.0–0.7)
Eosinophils Relative: 3.3 % (ref 0.0–5.0)
HCT: 36.8 % — ABNORMAL LOW (ref 39.0–52.0)
Hemoglobin: 12.2 g/dL — ABNORMAL LOW (ref 13.0–17.0)
Lymphocytes Relative: 28.8 % (ref 12.0–46.0)
Lymphs Abs: 1.9 10*3/uL (ref 0.7–4.0)
MCHC: 33.3 g/dL (ref 30.0–36.0)
MCV: 94.8 fl (ref 78.0–100.0)
Monocytes Absolute: 0.5 10*3/uL (ref 0.1–1.0)
Monocytes Relative: 7.1 % (ref 3.0–12.0)
Neutro Abs: 3.9 10*3/uL (ref 1.4–7.7)
Neutrophils Relative %: 60.1 % (ref 43.0–77.0)
Platelets: 208 10*3/uL (ref 150.0–400.0)
RBC: 3.88 Mil/uL — ABNORMAL LOW (ref 4.22–5.81)
RDW: 12.8 % (ref 11.5–15.5)
WBC: 6.5 10*3/uL (ref 4.0–10.5)

## 2021-10-21 LAB — URINALYSIS, ROUTINE W REFLEX MICROSCOPIC
Bilirubin Urine: NEGATIVE
Hgb urine dipstick: NEGATIVE
Ketones, ur: NEGATIVE
Leukocytes,Ua: NEGATIVE
Nitrite: NEGATIVE
RBC / HPF: NONE SEEN (ref 0–?)
Specific Gravity, Urine: 1.01 (ref 1.000–1.030)
Total Protein, Urine: 30 — AB
Urine Glucose: NEGATIVE
Urobilinogen, UA: 0.2 (ref 0.0–1.0)
pH: 7 (ref 5.0–8.0)

## 2021-10-21 LAB — LIPID PANEL
Cholesterol: 138 mg/dL (ref 0–200)
HDL: 46.4 mg/dL (ref >39.00)
LDL Cholesterol: 63 mg/dL (ref 0–99)
NonHDL: 91.63
Total CHOL/HDL Ratio: 3
Triglycerides: 143 mg/dL (ref 0.0–149.0)
VLDL: 28.6 mg/dL (ref 0.0–40.0)

## 2021-10-21 LAB — TSH: TSH: 4.22 u[IU]/mL (ref 0.35–5.50)

## 2021-10-21 LAB — VITAMIN D 25 HYDROXY (VIT D DEFICIENCY, FRACTURES): VITD: 45.82 ng/mL (ref 30.00–100.00)

## 2021-10-21 LAB — VITAMIN B12: Vitamin B-12: 561 pg/mL (ref 211–911)

## 2021-10-21 LAB — BASIC METABOLIC PANEL
BUN: 34 mg/dL — ABNORMAL HIGH (ref 6–23)
CO2: 30 mEq/L (ref 19–32)
Calcium: 9.6 mg/dL (ref 8.4–10.5)
Chloride: 104 mEq/L (ref 96–112)
Creatinine, Ser: 1.88 mg/dL — ABNORMAL HIGH (ref 0.40–1.50)
GFR: 30.82 mL/min — ABNORMAL LOW (ref >60.00)
Glucose, Bld: 100 mg/dL — ABNORMAL HIGH (ref 70–99)
Potassium: 5.2 mEq/L — ABNORMAL HIGH (ref 3.5–5.1)
Sodium: 141 mEq/L (ref 135–145)

## 2021-10-21 LAB — HEPATIC FUNCTION PANEL
ALT: 10 U/L (ref 0–53)
AST: 19 U/L (ref 0–37)
Albumin: 4.4 g/dL (ref 3.5–5.2)
Alkaline Phosphatase: 66 U/L (ref 39–117)
Bilirubin, Direct: 0.2 mg/dL (ref 0.0–0.3)
Total Bilirubin: 0.9 mg/dL (ref 0.2–1.2)
Total Protein: 7 g/dL (ref 6.0–8.3)

## 2021-10-21 LAB — HEMOGLOBIN A1C: Hgb A1c MFr Bld: 6.4 % (ref 4.6–6.5)

## 2021-10-21 MED ORDER — AMLODIPINE BESYLATE 2.5 MG PO TABS: 2.5000 mg | ORAL_TABLET | Freq: Every day | ORAL | 3 refills | Status: DC

## 2021-10-21 MED ORDER — SOLIFENACIN SUCCINATE 5 MG PO TABS: 5.0000 mg | ORAL_TABLET | Freq: Every day | ORAL | 3 refills | Status: DC

## 2021-10-21 NOTE — Patient Instructions (Addendum)
Please take all new medication as prescribed - the vesicare 5 mg per day, and the amlodipine 2.5 mg per day for blood pressure  Please continue all other medications as before, and refills have been done if requested.  Please have the pharmacy call with any other refills you may need.  Please continue your efforts at being more active, low cholesterol diet, and weight control.  Please keep your appointments with your specialists as you may have planned  You will be contacted regarding the referral for: Echocardiogram, and urology  Please go to the LAB at the blood drawing area for the tests to be done  You will be contacted by phone if any changes need to be made immediately.  Otherwise, you will receive a letter about your results with an explanation, but please check with MyChart first.  Please remember to sign up for MyChart if you have not done so, as this will be important to you in the future with finding out test results, communicating by private email, and scheduling acute appointments online when needed.  Please make an Appointment to return in 6 months, or sooner if needed

## 2021-10-21 NOTE — Progress Notes (Signed)
Patient ID: Stephen Wilkins, male   DOB: 12/29/1927, 88 y.o.   MRN: [id number]        Chief Complaint: follow up urinary frequency, bph, htn, heart murmur, low energy and fatigue       HPI:  Stephen Wilkins is a 88 y.o. male here with c/o 2 mo worsenig urinary frequency but Denies urinary symptoms such as dysuria, urgency, flank pain, hematuria or n/v, fever, chills. Flomax has helped slow urination.  Pt denies chest pain, increased sob or doe, wheezing, orthopnea, PND, increased LE swelling, palpitations, dizziness or syncope.   Pt denies polydipsia, polyuria, or new focal neuro s/s.          Wt Readings from Last 3 Encounters:  10/21/21 192 lb 6.4 oz (87.3 kg)  07/15/21 196 lb (88.9 kg)  04/16/21 193 lb (87.5 kg)   BP Readings from Last 3 Encounters:  10/21/21 (!) 142/80  07/15/21 120/60  04/16/21 122/72         Past Medical History:  Diagnosis Date   ABNORMAL ELECTROCARDIOGRAM 06/21/2007   ALLERGIC RHINITIS 03/23/2007   Anxiety state, unspecified 09/06/2013   ASTHMATIC BRONCHITIS, ACUTE 04/27/2007   BENIGN PROSTATIC HYPERTROPHY 03/23/2007   BRONCHITIS NOT SPECIFIED AS ACUTE OR CHRONIC 04/21/2007   BURSITIS, RIGHT HIP 07/31/2008   COPD (chronic obstructive pulmonary disease) ([place]) 04/19/2016   Cough 01/29/2009   Dementia ([place]) 09/01/2010   ERECTILE DYSFUNCTION 03/23/2007   ERECTILE DYSFUNCTION, ORGANIC 04/17/2009   Esophageal stricture    FREQUENCY, URINARY 04/26/2010   GERD 03/23/2007   HIATAL HERNIA    HYPERLIPIDEMIA 03/23/2007   HYPERTENSION 03/23/2007   MILD COGNITIVE IMPAIRMENT SO STATED 05/19/2010   NEPHROLITHIASIS, HX OF 03/23/2007   PSA, INCREASED 06/27/2008   RASH-NONVESICULAR 03/23/2007   REACTIVE AIRWAY DISEASE 01/14/2010   SCHATZKI'S RING    SCIATICA, RIGHT 04/28/2008   Unspecified hypothyroidism 09/06/2013   Wheezing 04/17/2009   Past Surgical History:  Procedure Laterality Date   ERCP N/A 12/28/2018   Procedure: ENDOSCOPIC RETROGRADE CHOLANGIOPANCREATOGRAPHY (ERCP);   Surgeon: Ladene Artist, MD;  Location: [facility];  Service: Endoscopy;  Laterality: N/A;   REMOVAL OF STONES  12/28/2018   Procedure: REMOVAL OF STONES;  Surgeon: Ladene Artist, MD;  Location: [facility];  Service: Endoscopy;;  balloon sweep   SPHINCTEROTOMY  12/28/2018   Procedure: [facility];  Surgeon: Ladene Artist, MD;  Location: [facility];  Service: Endoscopy;;   TONSILLECTOMY      reports that he has never smoked. He has never used smokeless tobacco. He reports that he does not drink alcohol and does not use drugs. family history includes Asthma in his sister; Cancer in an other family member; Emphysema in his brother; Hypertension in an other family member; Lung cancer in his brother. Allergies  Allergen Reactions   Atorvastatin     REACTION: myalgia   Doxazosin Mesylate     REACTION: dizziness   Hydrocodone Bit-Homatrop Mbr Other (See Comments)    anxiety   Current Outpatient Medications on File Prior to Visit  Medication Sig Dispense Refill   albuterol (VENTOLIN HFA) 108 (90 Base) MCG/ACT inhaler Inhale 2 puffs into the lungs every 6 (six) hours as needed for wheezing or shortness of breath. 8 g 11   ALPRAZolam (XANAX) 0.5 MG tablet 1/2 - 1 tab by mouth twice per day as needed 60 tablet 2   Cholecalciferol (VITAMIN D) 1000 UNITS capsule Take 1,000 Units by mouth every other day.      Cholecalciferol  50 MCG (2000 UT) TABS 1 tab by mouth once daily 30 tablet 99   desoximetasone (TOPICORT) 0.25 % cream APPLY SPARINGLY TO THE AFFECTED AREA TWICE DAILY AS NEEDED AS DIRECTED (Patient taking differently: Apply 1 application  topically 2 (two) times daily. Itching) 30 g 1   Fluticasone Propionate, Inhal, (FLOVENT DISKUS) 250 MCG/BLIST AEPB 1 puff twice per day 60 each 11   lansoprazole (PREVACID) 30 MG capsule TAKE 1 CAPSULE BY MOUTH ONCE DAILY AT  12  NOON 90 capsule 2   levothyroxine (SYNTHROID) 112 MCG tablet Take 1 tablet by mouth once daily 90 tablet 3   losartan  (COZAAR) 100 MG tablet Take 0.5 tablets (50 mg total) by mouth daily. 45 tablet 3   Multiple Vitamins-Minerals (CENTRUM SILVER PO) Take 1 tablet by mouth daily.      polyethylene glycol powder (MIRALAX) powder Take 1/2 capful by mouth once daily (Patient taking differently: Take 17 g by mouth 2 (two) times a week.) 850 g 5   rosuvastatin (CRESTOR) 10 MG tablet Take 1 tablet (10 mg total) by mouth daily. 90 tablet 3   tamsulosin (FLOMAX) 0.4 MG CAPS capsule TAKE 1 CAPSULE(0.4 MG) BY MOUTH AT BEDTIME 90 capsule 3   triamcinolone cream (KENALOG) 0.1 % Apply 1 application topically 2 (two) times daily. 30 g 0   vitamin B-12 (CYANOCOBALAMIN) 100 MCG tablet Take 100 mcg by mouth once a week.     vitamin C (ASCORBIC ACID) 500 MG tablet Take 1,000 mg by mouth daily.      No current facility-administered medications on file prior to visit.        ROS:  All others reviewed and negative.  Objective        PE:  BP (!) 142/80 (BP Location: Right Arm, Patient Position: Sitting, Cuff Size: Large)   Pulse 74   Temp 97.9 F (36.6 C) (Oral)   Ht 6' (1.829 m)   Wt 192 lb 6.4 oz (87.3 kg)   SpO2 95%   BMI 26.09 kg/m                 Constitutional: Pt appears in NAD               HENT: Head: NCAT.                Right Ear: External ear normal.                 Left Ear: External ear normal.                Eyes: . Pupils are equal, round, and reactive to light. Conjunctivae and EOM are normal               Nose: without d/c or deformity               Neck: Neck supple. Gross normal ROM               Cardiovascular: Normal rate and regular rhythm.  With gr 3/6 murmur RUSB               Pulmonary/Chest: Effort normal and breath sounds without rales or wheezing.                Abd:  Soft, NT, ND, + BS, no organomegaly               Neurological: Pt is alert. At baseline orientation, motor grossly intact  Skin: Skin is warm. No rashes, no other new lesions, LE edema - none                Psychiatric: Pt behavior is normal without agitation   Micro: none  Cardiac tracings I have personally interpreted today:  none  Pertinent Radiological findings (summarize): none   Lab Results  Component Value Date   WBC 6.5 10/21/2021   HGB 12.2 (L) 10/21/2021   HCT 36.8 (L) 10/21/2021   PLT 208.0 10/21/2021   GLUCOSE 100 (H) 10/21/2021   CHOL 138 10/21/2021   TRIG 143.0 10/21/2021   HDL 46.40 10/21/2021   LDLDIRECT 138.0 10/01/2020   LDLCALC 63 10/21/2021   ALT 10 10/21/2021   AST 19 10/21/2021   NA 141 10/21/2021   K 5.2 (H) 10/21/2021   CL 104 10/21/2021   CREATININE 1.88 (H) 10/21/2021   BUN 34 (H) 10/21/2021   CO2 30 10/21/2021   TSH 4.22 10/21/2021   PSA 3.94 03/12/2014   HGBA1C 6.4 10/21/2021   Assessment/Plan:  Stephen Wilkins is a 3 y.o. White or Caucasian [1] male with  has a past medical history of ABNORMAL ELECTROCARDIOGRAM (06/21/2007), ALLERGIC RHINITIS (03/23/2007), Anxiety state, unspecified (09/06/2013), ASTHMATIC BRONCHITIS, ACUTE (04/27/2007), BENIGN PROSTATIC HYPERTROPHY (03/23/2007), BRONCHITIS NOT SPECIFIED AS ACUTE OR CHRONIC (04/21/2007), BURSITIS, RIGHT HIP (07/31/2008), COPD (chronic obstructive pulmonary disease) ([place]) (04/19/2016), Cough (01/29/2009), Dementia ([place]) (09/01/2010), ERECTILE DYSFUNCTION (03/23/2007), ERECTILE DYSFUNCTION, ORGANIC (04/17/2009), Esophageal stricture, FREQUENCY, URINARY (04/26/2010), GERD (03/23/2007), HIATAL HERNIA, HYPERLIPIDEMIA (03/23/2007), HYPERTENSION (03/23/2007), MILD COGNITIVE IMPAIRMENT SO STATED (05/19/2010), NEPHROLITHIASIS, HX OF (03/23/2007), PSA, INCREASED (06/27/2008), RASH-NONVESICULAR (03/23/2007), REACTIVE AIRWAY DISEASE (01/14/2010), SCHATZKI'S RING, SCIATICA, RIGHT (04/28/2008), Unspecified hypothyroidism (09/06/2013), and Wheezing (04/17/2009).  Vitamin D deficiency Last vitamin D Lab Results  Component Value Date   VD25OH 45.82 10/21/2021   Stable, cont oral replacement   Hyperlipidemia Lab Results  Component  Value Date   LDLCALC 63 10/21/2021   Stable, pt to continue current statin crestor 10 qd   Hyperglycemia Lab Results  Component Value Date   HGBA1C 6.4 10/21/2021   Stable, pt to continue current medical treatment  - diet, wt control   Essential hypertension BP Readings from Last 3 Encounters:  10/21/21 (!) 142/80  07/15/21 120/60  04/16/21 122/72   Uncontrolled, pt to continue medical treatment losartan 100 gm qd, and add amlodipine 2.5 mg qd   Urinary frequency C/w probable OAB - for vesicare 5 mg qd, and refer urology  Low energy Stable, for lab today as ordered,  to f/u any worsening symptoms or concerns  Heart murmur ? Worsening - for echo  Benign prostatic hyperplasia with urinary frequency Improved, continue flomax 1 qd  Followup: No follow-ups on file.  Cathlean Cower, MD 10/24/2021 11:53 AM [facility]

## 2021-10-24 DIAGNOSIS — R350 Frequency of micturition: Secondary | ICD-10-CM | POA: Insufficient documentation

## 2021-10-24 DIAGNOSIS — N401 Enlarged prostate with lower urinary tract symptoms: Secondary | ICD-10-CM | POA: Insufficient documentation

## 2021-10-24 DIAGNOSIS — R011 Cardiac murmur, unspecified: Secondary | ICD-10-CM | POA: Insufficient documentation

## 2021-10-24 NOTE — Assessment & Plan Note (Signed)
Lab Results  Component Value Date   HGBA1C 6.4 10/21/2021   Stable, pt to continue current medical treatment  - diet, wt control  

## 2021-10-24 NOTE — Assessment & Plan Note (Signed)
Improved, continue flomax 1 qd

## 2021-10-24 NOTE — Assessment & Plan Note (Signed)
Lab Results  Component Value Date   LDLCALC 63 10/21/2021   Stable, pt to continue current statin crestor 10 qd

## 2021-10-24 NOTE — Assessment & Plan Note (Addendum)
C/w probable OAB - for vesicare 5 mg qd, and refer urology

## 2021-10-24 NOTE — Assessment & Plan Note (Signed)
Last vitamin D Lab Results  Component Value Date   VD25OH 45.82 10/21/2021   Stable, cont oral replacement  

## 2021-10-24 NOTE — Assessment & Plan Note (Signed)
?   Worsening - for echo

## 2021-10-24 NOTE — Assessment & Plan Note (Signed)
Stable, for lab today as ordered,  to f/u any worsening symptoms or concerns

## 2021-10-24 NOTE — Assessment & Plan Note (Signed)
BP Readings from Last 3 Encounters:  10/21/21 (!) 142/80  07/15/21 120/60  04/16/21 122/72   Uncontrolled, pt to continue medical treatment losartan 100 gm qd, and add amlodipine 2.5 mg qd

## 2021-10-29 ENCOUNTER — Ambulatory Visit
Admission: EM | Admit: 2021-10-29 | Discharge: 2021-10-29 | Disposition: A | Discharge status: Home / Self Care | Payer: PPO | Attending: Emergency Medicine | Admitting: Emergency Medicine

## 2021-10-29 DIAGNOSIS — J4541 Moderate persistent asthma with (acute) exacerbation: Secondary | ICD-10-CM | POA: Diagnosis not present

## 2021-10-29 DIAGNOSIS — T48906S Underdosing of unspecified agents primarily acting on the respiratory system, sequela: Secondary | ICD-10-CM

## 2021-10-29 MED ORDER — ALBUTEROL SULFATE (2.5 MG/3ML) 0.083% IN NEBU
2.5000 mg | INHALATION_SOLUTION | Freq: Once | RESPIRATORY_TRACT | Status: AC
  Administered 2021-10-29: 2.5 mg via RESPIRATORY_TRACT

## 2021-10-29 MED ORDER — FLUTICASONE PROPIONATE (INHAL) 250 MCG/ACT IN AEPB: 1.0000 | INHALATION_SPRAY | Freq: Two times a day (BID) | RESPIRATORY_TRACT | 3 refills | Status: DC

## 2021-10-29 MED ORDER — TRIAMCINOLONE ACETONIDE 40 MG/ML IJ SUSP
40.0000 mg | Freq: Once | INTRAMUSCULAR | Status: AC
  Administered 2021-10-29: 40 mg via INTRAMUSCULAR

## 2021-10-29 MED ORDER — METHYLPREDNISOLONE 4 MG PO TBPK: ORAL_TABLET | ORAL | 0 refills | Status: DC

## 2021-10-29 NOTE — ED Triage Notes (Addendum)
The patient c/o wheezing and cough that began  about a week ago.

## 2021-10-29 NOTE — ED Provider Notes (Signed)
[facility]    CSN: [id number] Arrival date & time: 10/29/21  1123    HISTORY   Chief Complaint  Patient presents with   Wheezing   HPI Stephen Wilkins is a pleasant, 88 y.o. male with a history of moderate persistent asthma and mild dementia who presents to urgent care with his wife today. Patient complains of nonproductive cough and wheezing that began about a week ago.  Patient states he is aware he has asthma and uses his albuterol inhaler at nighttime when he feels like he needs it.  When asked about whether or not he is using Flovent, patient states he was never prescribed that medication and is not currently using it.  Wife corroborates this response.  Patient states that he usually has episodes similar to what he is having at this time twice yearly and that a shot always makes him better.  EMR reviewed, per primary care provider note, patient is historically noncompliant with his maintenance inhaler, Flovent.  The history is provided by the patient and the spouse.   Past Medical History:  Diagnosis Date   ABNORMAL ELECTROCARDIOGRAM 06/21/2007   ALLERGIC RHINITIS 03/23/2007   Anxiety state, unspecified 09/06/2013   ASTHMATIC BRONCHITIS, ACUTE 04/27/2007   BENIGN PROSTATIC HYPERTROPHY 03/23/2007   BRONCHITIS NOT SPECIFIED AS ACUTE OR CHRONIC 04/21/2007   BURSITIS, RIGHT HIP 07/31/2008   COPD (chronic obstructive pulmonary disease) ([place]) 04/19/2016   Cough 01/29/2009   Dementia ([place]) 09/01/2010   ERECTILE DYSFUNCTION 03/23/2007   ERECTILE DYSFUNCTION, ORGANIC 04/17/2009   Esophageal stricture    FREQUENCY, URINARY 04/26/2010   GERD 03/23/2007   HIATAL HERNIA    HYPERLIPIDEMIA 03/23/2007   HYPERTENSION 03/23/2007   MILD COGNITIVE IMPAIRMENT SO STATED 05/19/2010   NEPHROLITHIASIS, HX OF 03/23/2007   PSA, INCREASED 06/27/2008   RASH-NONVESICULAR 03/23/2007   REACTIVE AIRWAY DISEASE 01/14/2010   SCHATZKI'S RING    SCIATICA, RIGHT 04/28/2008   Unspecified hypothyroidism  09/06/2013   Wheezing 04/17/2009   Patient Active Problem List   Diagnosis Date Noted   Urinary frequency 10/24/2021   Heart murmur 10/24/2021   Benign prostatic hyperplasia with urinary frequency 10/24/2021   Low energy 10/21/2021   Vitamin D deficiency 04/16/2021   Pain in joint of left shoulder 01/23/2020   Bilateral shoulder pain 11/22/2019   Myalgia 11/22/2019   Gait disorder 11/22/2019   Chronic pain of right thumb 05/17/2019   Primary osteoarthritis of first carpometacarpal joint of right hand 05/17/2019   Trigger finger of right thumb 05/17/2019   Corneal epithelial basement membrane dystrophy 03/04/2019   Keratoconjunctivitis sicca of both eyes not specified as Sjogren's 03/04/2019   Posterior vitreous detachment of both eyes 03/04/2019   Presbyopia of both eyes 03/04/2019   Pseudophakia of both eyes 03/04/2019   Febrile illness 12/27/2018   Choledocholithiasis 12/27/2018   Low back pain 07/11/2017   Non compliance w medication regimen 08/23/2016   Peripheral neuropathy 08/23/2016   COPD (chronic obstructive pulmonary disease) ([place]) 04/19/2016   Hyperglycemia 02/10/2016   Preventative health care 07/29/2015   CAP (community acquired pneumonia) 05/30/2015   Abnormal CXR 05/30/2015   Cough 04/22/2015   Asthma with exacerbation 04/22/2015   Urine abnormality 04/22/2015   Abdominal pain 09/11/2014   Abnormal liver function test 09/11/2014   Dysphagia 07/29/2014   Anxiety state 09/06/2013   CKD (chronic kidney disease) 09/06/2013   Hypothyroidism 09/06/2013   Rash and nonspecific skin eruption 02/27/2013   Dizziness and giddiness 01/04/2013   Abnormal TSH 06/04/2012  Gross hematuria 01/09/2012   Bruising 06/05/2011   Dementia ([place]) 09/01/2010   Cough variant asthma vs UACS  01/14/2010   ERECTILE DYSFUNCTION, ORGANIC 04/17/2009   FATIGUE 06/27/2008   PSA, INCREASED 06/27/2008   SCIATICA, RIGHT 04/28/2008   SCHATZKI'S RING 07/18/2007   HIATAL HERNIA 07/18/2007    Schatzki's ring 07/18/2007   ABNORMAL ELECTROCARDIOGRAM 06/21/2007   Hyperlipidemia 03/23/2007   Essential hypertension 03/23/2007   Allergic rhinitis 03/23/2007   GERD 03/23/2007   BENIGN PROSTATIC HYPERTROPHY 03/23/2007   NEPHROLITHIASIS, HX OF 03/23/2007   Past Surgical History:  Procedure Laterality Date   ERCP N/A 12/28/2018   Procedure: ENDOSCOPIC RETROGRADE CHOLANGIOPANCREATOGRAPHY (ERCP);  Surgeon: Ladene Artist, MD;  Location: [facility];  Service: Endoscopy;  Laterality: N/A;   REMOVAL OF STONES  12/28/2018   Procedure: REMOVAL OF STONES;  Surgeon: Ladene Artist, MD;  Location: [facility];  Service: Endoscopy;;  balloon sweep   SPHINCTEROTOMY  12/28/2018   Procedure: Joan Mayans;  Surgeon: Ladene Artist, MD;  Location: [facility];  Service: Endoscopy;;   TONSILLECTOMY      Home Medications    Prior to Admission medications   Medication Sig Start Date End Date Taking? Authorizing Provider  Fluticasone Propionate, Inhal, 250 MCG/ACT AEPB Inhale 1 puff into the lungs 2 (two) times daily. 10/29/21 10/24/22 Yes Lynden Oxford Scales, PA-C  albuterol (VENTOLIN HFA) 108 (90 Base) MCG/ACT inhaler Inhale 2 puffs into the lungs every 6 (six) hours as needed for wheezing or shortness of breath. 07/15/21   Biagio Borg, MD  ALPRAZolam Duanne Moron) 0.5 MG tablet 1/2 - 1 tab by mouth twice per day as needed 09/22/20   Biagio Borg, MD  amLODipine (NORVASC) 2.5 MG tablet Take 1 tablet (2.5 mg total) by mouth daily. 10/21/21   Biagio Borg, MD  Cholecalciferol (VITAMIN D) 1000 UNITS capsule Take 1,000 Units by mouth every other day.     [provider]  Cholecalciferol 50 MCG (2000 UT) TABS 1 tab by mouth once daily 04/16/21   Biagio Borg, MD  desoximetasone (TOPICORT) 0.25 % cream APPLY SPARINGLY TO THE AFFECTED AREA TWICE DAILY AS NEEDED AS DIRECTED Patient taking differently: Apply 1 application  topically 2 (two) times daily. Itching 04/30/18   Biagio Borg, MD   lansoprazole (PREVACID) 30 MG capsule TAKE 1 CAPSULE BY MOUTH ONCE DAILY AT  12  NOON 02/22/21   Biagio Borg, MD  levothyroxine (SYNTHROID) 112 MCG tablet Take 1 tablet by mouth once daily 07/07/21   Biagio Borg, MD  losartan (COZAAR) 100 MG tablet Take 0.5 tablets (50 mg total) by mouth daily. 04/16/21   Biagio Borg, MD  Multiple Vitamins-Minerals (CENTRUM SILVER PO) Take 1 tablet by mouth daily.     [provider]  polyethylene glycol powder (MIRALAX) powder Take 1/2 capful by mouth once daily Patient taking differently: Take 17 g by mouth 2 (two) times a week. 01/28/15   Biagio Borg, MD  rosuvastatin (CRESTOR) 10 MG tablet Take 1 tablet (10 mg total) by mouth daily. 04/16/21   Biagio Borg, MD  solifenacin (VESICARE) 5 MG tablet Take 1 tablet (5 mg total) by mouth daily. 10/21/21   Biagio Borg, MD  tamsulosin (FLOMAX) 0.4 MG CAPS capsule TAKE 1 CAPSULE(0.4 MG) BY MOUTH AT BEDTIME 12/24/20   Biagio Borg, MD  triamcinolone cream (KENALOG) 0.1 % Apply 1 application topically 2 (two) times daily. 12/24/20 12/24/21  Biagio Borg, MD  vitamin B-12 (CYANOCOBALAMIN) 100 MCG tablet Take 100 mcg by mouth once a week.    [provider]  vitamin C (ASCORBIC ACID) 500 MG tablet Take 1,000 mg by mouth daily.     [provider]    Family History Family History  Problem Relation Age of Onset   Lung cancer Brother        smoked   Cancer Other        lung cancer   Hypertension Other    Emphysema Brother        smoked   Asthma Sister    Social History Social History   Tobacco Use   Smoking status: Never   Smokeless tobacco: Never   Tobacco comments:    tried smoking in HS  Vaping Use   Vaping Use: Never used  Substance Use Topics   Alcohol use: No   Drug use: No   Allergies   Atorvastatin, Doxazosin mesylate, and Hydrocodone bit-homatrop mbr  Review of Systems Review of Systems Pertinent findings revealed after performing a 14 point review of systems  has been noted in the history of present illness.  Physical Exam Triage Vital Signs ED Triage Vitals  Enc Vitals Group     BP 01/29/21 0827 (!) 147/82     Pulse Rate 01/29/21 0827 72     Resp 01/29/21 0827 18     Temp 01/29/21 0827 98.3 F (36.8 C)     Temp Source 01/29/21 0827 Oral     SpO2 01/29/21 0827 98 %     Weight --      Height --      Head Circumference --      Peak Flow --      Pain Score 01/29/21 0826 5     Pain Loc --      Pain Edu? --      Excl. in [place]? --   No data found.  Updated Vital Signs BP 111/71 (BP Location: Right Arm)   Pulse 88   Temp 98 F (36.7 C) (Oral)   Resp 16   SpO2 91%   Physical Exam Vitals and nursing note reviewed.  Constitutional:      General: He is not in acute distress.    Appearance: Normal appearance. He is not ill-appearing.  HENT:     Head: Normocephalic and atraumatic.     Salivary Glands: Right salivary gland is not diffusely enlarged or tender. Left salivary gland is not diffusely enlarged or tender.     Right Ear: Tympanic membrane, ear canal and external ear normal. No drainage. No middle ear effusion. There is no impacted cerumen. Tympanic membrane is not erythematous or bulging.     Left Ear: Tympanic membrane, ear canal and external ear normal. No drainage.  No middle ear effusion. There is no impacted cerumen. Tympanic membrane is not erythematous or bulging.     Nose: Nose normal. No nasal deformity, septal deviation, mucosal edema, congestion or rhinorrhea.     Right Turbinates: Not enlarged, swollen or pale.     Left Turbinates: Not enlarged, swollen or pale.     Right Sinus: No maxillary sinus tenderness or frontal sinus tenderness.     Left Sinus: No maxillary sinus tenderness or frontal sinus tenderness.     Mouth/Throat:     Lips: Pink. No lesions.     Mouth: Mucous membranes are moist. No oral lesions.     Pharynx: Oropharynx is clear. Uvula midline. No posterior oropharyngeal erythema  or uvula swelling.      Tonsils: No tonsillar exudate. 0 on the right. 0 on the left.  Eyes:     General: Lids are normal.        Right eye: No discharge.        Left eye: No discharge.     Extraocular Movements: Extraocular movements intact.     Conjunctiva/sclera: Conjunctivae normal.     Right eye: Right conjunctiva is not injected.     Left eye: Left conjunctiva is not injected.  Neck:     Trachea: Trachea and phonation normal.  Cardiovascular:     Rate and Rhythm: Normal rate and regular rhythm.     Pulses: Normal pulses.     Heart sounds: Normal heart sounds. No murmur heard.    No friction rub. No gallop.  Pulmonary:     Effort: Pulmonary effort is normal. Prolonged expiration present. No tachypnea, bradypnea, accessory muscle usage, respiratory distress or retractions.     Breath sounds: No stridor, decreased air movement or transmitted upper airway sounds. Examination of the right-upper field reveals wheezing. Examination of the left-upper field reveals wheezing. Examination of the right-middle field reveals wheezing. Examination of the left-middle field reveals wheezing. Examination of the right-lower field reveals wheezing. Examination of the left-lower field reveals wheezing. Wheezing present. No decreased breath sounds, rhonchi or rales.     Comments: Pulmonary exam post nebulized albuterol treatment revealed mild improvement of wheezing and upper lung fields, no improvement in lower lung fields.  Patient reported improved work of breathing. Chest:     Chest wall: No tenderness.  Musculoskeletal:        General: Normal range of motion.     Cervical back: Normal range of motion and neck supple. Normal range of motion.  Lymphadenopathy:     Cervical: No cervical adenopathy.  Skin:    General: Skin is warm and dry.     Findings: No erythema or rash.  Neurological:     General: No focal deficit present.     Mental Status: He is alert and oriented to person, place, and time.  Psychiatric:         Mood and Affect: Mood normal.        Behavior: Behavior normal.     Visual Acuity Right Eye Distance:   Left Eye Distance:   Bilateral Distance:    Right Eye Near:   Left Eye Near:    Bilateral Near:     UC Couse / Diagnostics / Procedures:     Radiology No results found.  Procedures Procedures (including critical care time) EKG  Pending results:  Labs Reviewed - No data to display  Medications Ordered in UC: Medications  albuterol (PROVENTIL) (2.5 MG/3ML) 0.083% nebulizer solution 2.5 mg (2.5 mg Nebulization Given 10/29/21 1224)  triamcinolone acetonide (KENALOG-40) injection 40 mg (40 mg Intramuscular Given 10/29/21 1224)    UC Diagnoses / Final Clinical Impressions(s)   I have reviewed the triage vital signs and the nursing notes.  Pertinent labs & imaging results that were available during my care of the patient were reviewed by me and considered in my medical decision making (see chart for details).    Final diagnoses:  Moderate persistent asthma with acute exacerbation in adult  Underdosing of agent primarily affecting respiratory system, sequela   Patient was provided with a refill of his fluticasone inhaler and advised to use daily.  Patient was given instructions 3 times during his visit and asked to verbally  teach back of his instructions to me.  Patient was advised to continue using his albuterol inhaler as needed when feeling short of breath.  Patient was also advised to begin Medrol Dosepak, 1 row of tablets daily until complete.  Return precautions advised.  ED Prescriptions     Medication Sig Dispense Auth. Provider   Fluticasone Propionate, Inhal, 250 MCG/ACT AEPB Inhale 1 puff into the lungs 2 (two) times daily. 3 each Lynden Oxford Scales, PA-C   methylPREDNISolone (MEDROL DOSEPAK) 4 MG TBPK tablet Take 24 mg on day 1, 20 mg on day 2, 16 mg on day 3, 12 mg on day 4, 8 mg on day 5, 4 mg on day 6.  Take all tablets in each row at once, do not spread  tablets out throughout the day. 21 tablet Lynden Oxford Scales, PA-C      PDMP not reviewed this encounter.  Disposition Upon Discharge:  Condition: stable for discharge home Home: take medications as prescribed; routine discharge instructions as discussed; follow up as advised.  Patient presented with an acute illness with associated systemic symptoms and significant discomfort requiring urgent management. In my opinion, this is a condition that a prudent lay person (someone who possesses an average knowledge of health and medicine) may potentially expect to result in complications if not addressed urgently such as respiratory distress, impairment of bodily function or dysfunction of bodily organs.   Routine symptom specific, illness specific and/or disease specific instructions were discussed with the patient and/or caregiver at length.   As such, the patient has been evaluated and assessed, work-up was performed and treatment was provided in alignment with urgent care protocols and evidence based medicine.  Patient/parent/caregiver has been advised that the patient may require follow up for further testing and treatment if the symptoms continue in spite of treatment, as clinically indicated and appropriate.  If the patient was tested for COVID-19, Influenza and/or RSV, then the patient/parent/guardian was advised to isolate at home pending the results of his/her diagnostic coronavirus test and potentially longer if they're positive. I have also advised pt that if his/her COVID-19 test returns positive, it's recommended to self-isolate for at least 10 days after symptoms first appeared AND until fever-free for 24 hours without fever reducer AND other symptoms have improved or resolved. Discussed self-isolation recommendations as well as instructions for household member/close contacts as per the [facility] and [place] DHHS, and also gave patient the [place] packet with this  information.  Patient/parent/caregiver has been advised to return to the [facility] or PCP in 3-5 days if no better; to PCP or the Emergency Department if new signs and symptoms develop, or if the current signs or symptoms continue to change or worsen for further workup, evaluation and treatment as clinically indicated and appropriate  The patient will follow up with their current PCP if and as advised. If the patient does not currently have a PCP we will assist them in obtaining one.   The patient may need specialty follow up if the symptoms continue, in spite of conservative treatment and management, for further workup, evaluation, consultation and treatment as clinically indicated and appropriate.  Patient/parent/caregiver verbalized understanding and agreement of plan as discussed.  All questions were addressed during visit.  Please see discharge instructions below for further details of plan.  Discharge Instructions:   Discharge Instructions      Please see the list below for recommended medications, dosages and frequencies to provide relief of your current symptoms:   Your symptoms  and my physical exam findings are concerning for reactive airway disease with an acute exacerbation.  Reactive airways disease is when your lower airway becomes inflamed due to viral infection, bacterial infection or allergy exposure.    Please see the list below for recommended medications, dosages and frequencies to provide relief of your current symptoms:     ProAir, Ventolin, Proventil (albuterol): This inhaled medication contains a short acting beta agonist bronchodilator.  This is a rescue inhaler and should be used when you are having breakthrough wheezing, shortness of breath or coughing.  This medication works on the smooth muscle of your airways by relaxing the muscles.  The result of relaxation of the smooth muscle is increased air movement and improved work of breathing.  This is a short acting medication  that can be used every 4-6 hours as needed for increased work of breathing, shortness of breath, wheezing and excessive coughing.      Flovent (fluticasone): This is a maintenance asthma medication meaning it keeps her asthma under control and it should be used every day whether you are having symptoms of wheezing or not.  Please inhale 1 puffs twice daily.  This inhaled medication contains a corticosteroid.  The inhaled steroid is not absorbed into the body and will not cause side effects such as increased blood sugar levels, irritability, sleeplessness or weight gain.  Inhaled corticosteroid are sort of like topical steroid creams but, as you can imagine, it is not practical to attempt to rub a steroid cream inside of your lungs.  Please feel free to continue using your short acting rescue inhaler as often as needed throughout the day for shortness of breath, wheezing, and cough.   Not taking your inhaled medications as prescribed can increase your risk of more frequent upper respiratory infections, lower respiratory disorders, skin reactions, and eye irritations that may or may not require the use of antibiotics and steroids and can result in loss of time at work, celebrations with family and friends as well as missed social opportunities.   Oral steroids can cause elevated blood sugar levels, high blood pressure, muscle weakness, swelling in the extremities, mood swings, upset stomach, difficulty sleeping, headaches, fatigue and fragile skin.  Oral steroids can also increase your risk of bacterial infections.   Please follow-up with either your primary care provider or with urgent care for repeat evaluation you of your lungs in the next 3 to 4 days to ensure that you are improving and also to evaluate whether or not your treatment regimen needs to be adjusted.    Thank you for visiting urgent care today.  We appreciate the opportunity to participate in your care.       This office note has been  dictated using Museum/gallery curator.  Unfortunately, this method of dictation can sometimes lead to typographical or grammatical errors.  I apologize for your inconvenience in advance if this occurs.  Please do not hesitate to reach out to me if clarification is needed.      Lynden Oxford Scales, PA-C 10/29/21 1252

## 2021-10-29 NOTE — Discharge Instructions (Addendum)
Please see the list below for recommended medications, dosages and frequencies to provide relief of your current symptoms:   Your symptoms and my physical exam findings are concerning for reactive airway disease with an acute exacerbation.  Reactive airways disease is when your lower airway becomes inflamed due to viral infection, bacterial infection or allergy exposure.    Please see the list below for recommended medications, dosages and frequencies to provide relief of your current symptoms:     ProAir, Ventolin, Proventil (albuterol): This inhaled medication contains a short acting beta agonist bronchodilator.  This is a rescue inhaler and should be used when you are having breakthrough wheezing, shortness of breath or coughing.  This medication works on the smooth muscle of your airways by relaxing the muscles.  The result of relaxation of the smooth muscle is increased air movement and improved work of breathing.  This is a short acting medication that can be used every 4-6 hours as needed for increased work of breathing, shortness of breath, wheezing and excessive coughing.      Flovent (fluticasone): This is a maintenance asthma medication meaning it keeps her asthma under control and it should be used every day whether you are having symptoms of wheezing or not.  Please inhale 1 puffs twice daily.  This inhaled medication contains a corticosteroid.  The inhaled steroid is not absorbed into the body and will not cause side effects such as increased blood sugar levels, irritability, sleeplessness or weight gain.  Inhaled corticosteroid are sort of like topical steroid creams but, as you can imagine, it is not practical to attempt to rub a steroid cream inside of your lungs.  Please feel free to continue using your short acting rescue inhaler as often as needed throughout the day for shortness of breath, wheezing, and cough.   Not taking your inhaled medications as prescribed can increase your risk of  more frequent upper respiratory infections, lower respiratory disorders, skin reactions, and eye irritations that may or may not require the use of antibiotics and steroids and can result in loss of time at work, celebrations with family and friends as well as missed social opportunities.   Oral steroids can cause elevated blood sugar levels, high blood pressure, muscle weakness, swelling in the extremities, mood swings, upset stomach, difficulty sleeping, headaches, fatigue and fragile skin.  Oral steroids can also increase your risk of bacterial infections.   Please follow-up with either your primary care provider or with urgent care for repeat evaluation you of your lungs in the next 3 to 4 days to ensure that you are improving and also to evaluate whether or not your treatment regimen needs to be adjusted.    Thank you for visiting urgent care today.  We appreciate the opportunity to participate in your care.

## 2021-11-15 DIAGNOSIS — L603 Nail dystrophy: Secondary | ICD-10-CM | POA: Diagnosis not present

## 2021-11-15 DIAGNOSIS — L03032 Cellulitis of left toe: Secondary | ICD-10-CM | POA: Diagnosis not present

## 2021-11-15 DIAGNOSIS — L03031 Cellulitis of right toe: Secondary | ICD-10-CM | POA: Diagnosis not present

## 2021-11-15 DIAGNOSIS — M79671 Pain in right foot: Secondary | ICD-10-CM | POA: Diagnosis not present

## 2021-11-15 DIAGNOSIS — M79672 Pain in left foot: Secondary | ICD-10-CM | POA: Diagnosis not present

## 2021-11-15 DIAGNOSIS — I739 Peripheral vascular disease, unspecified: Secondary | ICD-10-CM | POA: Diagnosis not present

## 2021-11-15 DIAGNOSIS — B351 Tinea unguium: Secondary | ICD-10-CM | POA: Diagnosis not present

## 2021-11-18 ENCOUNTER — Ambulatory Visit (HOSPITAL_COMMUNITY): Payer: PPO | Attending: Cardiology

## 2021-11-18 DIAGNOSIS — R011 Cardiac murmur, unspecified: Secondary | ICD-10-CM

## 2021-11-18 LAB — ECHOCARDIOGRAM COMPLETE
Area-P 1/2: 3.68 cm2
Calc EF: 58.3 %
S' Lateral: 2.4 cm
Single Plane A2C EF: 64.9 %
Single Plane A4C EF: 55.3 %

## 2021-11-25 DIAGNOSIS — R3914 Feeling of incomplete bladder emptying: Secondary | ICD-10-CM | POA: Diagnosis not present

## 2021-11-25 DIAGNOSIS — R350 Frequency of micturition: Secondary | ICD-10-CM | POA: Diagnosis not present

## 2021-11-25 DIAGNOSIS — R3915 Urgency of urination: Secondary | ICD-10-CM | POA: Diagnosis not present

## 2021-11-25 DIAGNOSIS — N401 Enlarged prostate with lower urinary tract symptoms: Secondary | ICD-10-CM | POA: Diagnosis not present

## 2021-12-02 ENCOUNTER — Telehealth: Payer: Self-pay | Admitting: Emergency Medicine

## 2021-12-02 NOTE — Telephone Encounter (Signed)
Please disregard, encounter opened in error.

## 2021-12-16 ENCOUNTER — Encounter: Payer: Self-pay | Admitting: Internal Medicine

## 2021-12-17 MED ORDER — CETIRIZINE HCL 10 MG PO TABS: 10.0000 mg | ORAL_TABLET | Freq: Every day | ORAL | 11 refills | Status: DC

## 2021-12-21 ENCOUNTER — Ambulatory Visit (INDEPENDENT_AMBULATORY_CARE_PROVIDER_SITE_OTHER): Payer: PPO | Admitting: Internal Medicine

## 2021-12-21 ENCOUNTER — Ambulatory Visit (INDEPENDENT_AMBULATORY_CARE_PROVIDER_SITE_OTHER): Payer: PPO

## 2021-12-21 ENCOUNTER — Encounter: Payer: Self-pay | Admitting: Internal Medicine

## 2021-12-21 VITALS — BP 142/84 | HR 85 | Temp 97.8°F | Ht 72.0 in | Wt 185.0 lb

## 2021-12-21 DIAGNOSIS — I10 Essential (primary) hypertension: Secondary | ICD-10-CM | POA: Diagnosis not present

## 2021-12-21 DIAGNOSIS — J4541 Moderate persistent asthma with (acute) exacerbation: Secondary | ICD-10-CM | POA: Diagnosis not present

## 2021-12-21 DIAGNOSIS — N184 Chronic kidney disease, stage 4 (severe): Secondary | ICD-10-CM | POA: Diagnosis not present

## 2021-12-21 DIAGNOSIS — R0602 Shortness of breath: Secondary | ICD-10-CM | POA: Diagnosis not present

## 2021-12-21 DIAGNOSIS — F01B4 Vascular dementia, moderate, with anxiety: Secondary | ICD-10-CM

## 2021-12-21 DIAGNOSIS — R062 Wheezing: Secondary | ICD-10-CM | POA: Diagnosis not present

## 2021-12-21 DIAGNOSIS — R350 Frequency of micturition: Secondary | ICD-10-CM

## 2021-12-21 MED ORDER — ALBUTEROL SULFATE (2.5 MG/3ML) 0.083% IN NEBU: 2.5000 mg | INHALATION_SOLUTION | Freq: Four times a day (QID) | RESPIRATORY_TRACT | 2 refills | Status: DC | PRN

## 2021-12-21 MED ORDER — PREDNISONE 10 MG PO TABS: ORAL_TABLET | ORAL | 0 refills | Status: DC

## 2021-12-21 MED ORDER — METHYLPREDNISOLONE ACETATE 80 MG/ML IJ SUSP
80.0000 mg | Freq: Once | INTRAMUSCULAR | Status: AC
  Administered 2021-12-21: 80 mg via INTRAMUSCULAR

## 2021-12-21 NOTE — Assessment & Plan Note (Addendum)
Mild to mod exacerbation, afeb, doubt infectious related, for depomedrol im80 mg, prednisone taper course,  Albuterol neb  6 prn,  to f/u any worsening symptoms or concerns, also for cxr

## 2021-12-21 NOTE — Patient Instructions (Signed)
You had the steroid shot today  Please take all new medication as prescribed - the nebulizer medication, and prednisone  Please continue all other medications as before, and refills have been done if requested.  Please have the pharmacy call with any other refills you may need.  Please keep your appointments with your specialists as you may have planned  Please go to the XRAY Department in the first floor for the x-ray testing  You will be contacted by phone if any changes need to be made immediately.  Otherwise, you will receive a letter about your results with an explanation, but please check with MyChart first.  Please remember to sign up for MyChart if you have not done so, as this will be important to you in the future with finding out test results, communicating by private email, and scheduling acute appointments online when needed.

## 2021-12-21 NOTE — Assessment & Plan Note (Signed)
Overall stable, declines aricept start or neuro referral,  to f/u any worsening symptoms or concerns

## 2021-12-21 NOTE — Assessment & Plan Note (Signed)
Lab Results  Component Value Date   CREATININE 1.88 (H) 10/21/2021   Stable overall, cont to avoid nephrotoxins, declines further lab today

## 2021-12-21 NOTE — Progress Notes (Signed)
Patient ID: Stephen Wilkins, male   DOB: 03-14-1928, 88 y.o.   MRN: [id number]        Chief Complaint: follow up cough and wheezing x 3 days       HPI:  Stephen Wilkins is a 88 y.o. male here with c/o worsening acute cough and wheezing sob doe with getting outdoors on Saturday, Pt denies chest pain, increased sob or doe, wheezing, orthopnea, PND, increased LE swelling, palpitations, dizziness or syncope.  Pt denies fever, wt loss, night sweats, loss of appetite, or other constitutional symptoms.   Pt denies polydipsia, polyuria, or new focal neuro s/s.   Asking for depo 80 mg as this has helped in past  Dementia overall stable symptomatically, and not assoc with behavioral changes such as hallucinations, paranoia, or agitation.       Wt Readings from Last 3 Encounters:  12/21/21 185 lb (83.9 kg)  10/21/21 192 lb 6.4 oz (87.3 kg)  07/15/21 196 lb (88.9 kg)   BP Readings from Last 3 Encounters:  12/21/21 (!) 142/84  10/29/21 111/71  10/21/21 (!) 142/80         Past Medical History:  Diagnosis Date   ABNORMAL ELECTROCARDIOGRAM 06/21/2007   ALLERGIC RHINITIS 03/23/2007   Anxiety state, unspecified 09/06/2013   ASTHMATIC BRONCHITIS, ACUTE 04/27/2007   BENIGN PROSTATIC HYPERTROPHY 03/23/2007   BRONCHITIS NOT SPECIFIED AS ACUTE OR CHRONIC 04/21/2007   BURSITIS, RIGHT HIP 07/31/2008   COPD (chronic obstructive pulmonary disease) ([place]) 04/19/2016   Cough 01/29/2009   Dementia ([place]) 09/01/2010   ERECTILE DYSFUNCTION 03/23/2007   ERECTILE DYSFUNCTION, ORGANIC 04/17/2009   Esophageal stricture    FREQUENCY, URINARY 04/26/2010   GERD 03/23/2007   HIATAL HERNIA    HYPERLIPIDEMIA 03/23/2007   HYPERTENSION 03/23/2007   MILD COGNITIVE IMPAIRMENT SO STATED 05/19/2010   NEPHROLITHIASIS, HX OF 03/23/2007   PSA, INCREASED 06/27/2008   RASH-NONVESICULAR 03/23/2007   REACTIVE AIRWAY DISEASE 01/14/2010   SCHATZKI'S RING    SCIATICA, RIGHT 04/28/2008   Unspecified hypothyroidism 09/06/2013   Wheezing 04/17/2009    Past Surgical History:  Procedure Laterality Date   ERCP N/A 12/28/2018   Procedure: ENDOSCOPIC RETROGRADE CHOLANGIOPANCREATOGRAPHY (ERCP);  Surgeon: Ladene Artist, MD;  Location: [facility];  Service: Endoscopy;  Laterality: N/A;   REMOVAL OF STONES  12/28/2018   Procedure: REMOVAL OF STONES;  Surgeon: Ladene Artist, MD;  Location: [facility];  Service: Endoscopy;;  balloon sweep   SPHINCTEROTOMY  12/28/2018   Procedure: Joan Mayans;  Surgeon: Ladene Artist, MD;  Location: [facility];  Service: Endoscopy;;   TONSILLECTOMY      reports that he has never smoked. He has never used smokeless tobacco. He reports that he does not drink alcohol and does not use drugs. family history includes Asthma in his sister; Cancer in an other family member; Emphysema in his brother; Hypertension in an other family member; Lung cancer in his brother. Allergies  Allergen Reactions   Atorvastatin     REACTION: myalgia   Doxazosin Mesylate     REACTION: dizziness   Hydrocodone Bit-Homatrop Mbr Other (See Comments)    anxiety   Current Outpatient Medications on File Prior to Visit  Medication Sig Dispense Refill   albuterol (VENTOLIN HFA) 108 (90 Base) MCG/ACT inhaler Inhale 2 puffs into the lungs every 6 (six) hours as needed for wheezing or shortness of breath. 8 g 11   ALPRAZolam (XANAX) 0.5 MG tablet 1/2 - 1 tab by mouth twice per day as needed  60 tablet 2   amLODipine (NORVASC) 2.5 MG tablet Take 1 tablet (2.5 mg total) by mouth daily. 90 tablet 3   cetirizine (ZYRTEC) 10 MG tablet Take 1 tablet (10 mg total) by mouth daily. 30 tablet 11   Cholecalciferol (VITAMIN D) 1000 UNITS capsule Take 1,000 Units by mouth every other day.      Cholecalciferol 50 MCG (2000 UT) TABS 1 tab by mouth once daily 30 tablet 99   desoximetasone (TOPICORT) 0.25 % cream APPLY SPARINGLY TO THE AFFECTED AREA TWICE DAILY AS NEEDED AS DIRECTED (Patient taking differently: Apply 1 application  topically 2 (two)  times daily. Itching) 30 g 1   Fluticasone Propionate, Inhal, 250 MCG/ACT AEPB Inhale 1 puff into the lungs 2 (two) times daily. 3 each 3   lansoprazole (PREVACID) 30 MG capsule TAKE 1 CAPSULE BY MOUTH ONCE DAILY AT  12  NOON 90 capsule 2   levothyroxine (SYNTHROID) 112 MCG tablet Take 1 tablet by mouth once daily 90 tablet 3   losartan (COZAAR) 100 MG tablet Take 0.5 tablets (50 mg total) by mouth daily. 45 tablet 3   methylPREDNISolone (MEDROL DOSEPAK) 4 MG TBPK tablet Take 24 mg on day 1, 20 mg on day 2, 16 mg on day 3, 12 mg on day 4, 8 mg on day 5, 4 mg on day 6.  Take all tablets in each row at once, do not spread tablets out throughout the day. 21 tablet 0   Multiple Vitamins-Minerals (CENTRUM SILVER PO) Take 1 tablet by mouth daily.      polyethylene glycol powder (MIRALAX) powder Take 1/2 capful by mouth once daily (Patient taking differently: Take 17 g by mouth 2 (two) times a week.) 850 g 5   rosuvastatin (CRESTOR) 10 MG tablet Take 1 tablet (10 mg total) by mouth daily. 90 tablet 3   solifenacin (VESICARE) 5 MG tablet Take 1 tablet (5 mg total) by mouth daily. 90 tablet 3   tamsulosin (FLOMAX) 0.4 MG CAPS capsule TAKE 1 CAPSULE(0.4 MG) BY MOUTH AT BEDTIME 90 capsule 3   triamcinolone cream (KENALOG) 0.1 % Apply 1 application topically 2 (two) times daily. 30 g 0   vitamin B-12 (CYANOCOBALAMIN) 100 MCG tablet Take 100 mcg by mouth once a week.     vitamin C (ASCORBIC ACID) 500 MG tablet Take 1,000 mg by mouth daily.      No current facility-administered medications on file prior to visit.        ROS:  All others reviewed and negative.  Objective        PE:  BP (!) 142/84 (BP Location: Left Arm, Patient Position: Sitting, Cuff Size: Large)   Pulse 85   Temp 97.8 F (36.6 C) (Oral)   Ht 6' (1.829 m)   Wt 185 lb (83.9 kg)   SpO2 93%   BMI 25.09 kg/m                 Constitutional: Pt appears in NAD               HENT: Head: NCAT.                Right Ear: External ear normal.                  Left Ear: External ear normal.                Eyes: . Pupils are equal, round, and reactive to light. Conjunctivae and EOM  are normal               Nose: without d/c or deformity               Neck: Neck supple. Gross normal ROM               Cardiovascular: Normal rate and regular rhythm.                 Pulmonary/Chest: Effort normal and breath sounds without rales but with midl diffuse wheezing.                Abd:  Soft, NT, ND, + BS, no organomegaly               Neurological: Pt is alert. At baseline orientation, motor grossly intact               Skin: Skin is warm. No rashes, no other new lesions, LE edema - none               Psychiatric: Pt behavior is normal without agitation   Micro: none  Cardiac tracings I have personally interpreted today:  none  Pertinent Radiological findings (summarize): none   Lab Results  Component Value Date   WBC 6.5 10/21/2021   HGB 12.2 (L) 10/21/2021   HCT 36.8 (L) 10/21/2021   PLT 208.0 10/21/2021   GLUCOSE 100 (H) 10/21/2021   CHOL 138 10/21/2021   TRIG 143.0 10/21/2021   HDL 46.40 10/21/2021   LDLDIRECT 138.0 10/01/2020   LDLCALC 63 10/21/2021   ALT 10 10/21/2021   AST 19 10/21/2021   NA 141 10/21/2021   K 5.2 (H) 10/21/2021   CL 104 10/21/2021   CREATININE 1.88 (H) 10/21/2021   BUN 34 (H) 10/21/2021   CO2 30 10/21/2021   TSH 4.22 10/21/2021   PSA 3.94 03/12/2014   HGBA1C 6.4 10/21/2021   Assessment/Plan:  Stephen Wilkins is a 58 y.o. White or Caucasian [1] male with  has a past medical history of ABNORMAL ELECTROCARDIOGRAM (06/21/2007), ALLERGIC RHINITIS (03/23/2007), Anxiety state, unspecified (09/06/2013), ASTHMATIC BRONCHITIS, ACUTE (04/27/2007), BENIGN PROSTATIC HYPERTROPHY (03/23/2007), BRONCHITIS NOT SPECIFIED AS ACUTE OR CHRONIC (04/21/2007), BURSITIS, RIGHT HIP (07/31/2008), COPD (chronic obstructive pulmonary disease) ([place]) (04/19/2016), Cough (01/29/2009), Dementia ([place]) (09/01/2010), ERECTILE DYSFUNCTION  (03/23/2007), ERECTILE DYSFUNCTION, ORGANIC (04/17/2009), Esophageal stricture, FREQUENCY, URINARY (04/26/2010), GERD (03/23/2007), HIATAL HERNIA, HYPERLIPIDEMIA (03/23/2007), HYPERTENSION (03/23/2007), MILD COGNITIVE IMPAIRMENT SO STATED (05/19/2010), NEPHROLITHIASIS, HX OF (03/23/2007), PSA, INCREASED (06/27/2008), RASH-NONVESICULAR (03/23/2007), REACTIVE AIRWAY DISEASE (01/14/2010), SCHATZKI'S RING, SCIATICA, RIGHT (04/28/2008), Unspecified hypothyroidism (09/06/2013), and Wheezing (04/17/2009).  Asthma with exacerbation Mild to mod exacerbation, afeb, doubt infectious related, for depomedrol im80 mg, prednisone taper course,  Albuterol neb  6 prn,  to f/u any worsening symptoms or concerns, also for cxr  Dementia (HCC) Overall stable, declines aricept start or neuro referral,  to f/u any worsening symptoms or concerns  CKD (chronic kidney disease) Lab Results  Component Value Date   CREATININE 1.88 (H) 10/21/2021   Stable overall, cont to avoid nephrotoxins, declines further lab today  Essential hypertension BP Readings from Last 3 Encounters:  12/21/21 (!) 142/84  10/29/21 111/71  10/21/21 (!) 142/80   Mild uncontrolled, pt to continue medical treatment norvasc 2.5 mg qd, losartan 50 mg qd as likely reactive,  to f/u any worsening symptoms or concerns  Followup: Return in about 6 months (around 06/21/2022).  Cathlean Cower, MD 12/21/2021 7:37 PM [place]  [facility]

## 2021-12-21 NOTE — Assessment & Plan Note (Signed)
BP Readings from Last 3 Encounters:  12/21/21 (!) 142/84  10/29/21 111/71  10/21/21 (!) 142/80   Mild uncontrolled, pt to continue medical treatment norvasc 2.5 mg qd, losartan 50 mg qd as likely reactive,  to f/u any worsening symptoms or concerns

## 2021-12-25 DIAGNOSIS — J441 Chronic obstructive pulmonary disease with (acute) exacerbation: Secondary | ICD-10-CM | POA: Diagnosis not present

## 2022-01-29 ENCOUNTER — Other Ambulatory Visit: Payer: Self-pay | Admitting: Internal Medicine

## 2022-02-14 DIAGNOSIS — H1820 Unspecified corneal edema: Secondary | ICD-10-CM | POA: Diagnosis not present

## 2022-02-14 DIAGNOSIS — H524 Presbyopia: Secondary | ICD-10-CM | POA: Diagnosis not present

## 2022-02-18 ENCOUNTER — Encounter: Payer: Self-pay | Admitting: Internal Medicine

## 2022-02-21 NOTE — Telephone Encounter (Signed)
Do you recommend the RSV vaccine for this patient? He is experiencing some wheezing

## 2022-02-21 NOTE — Telephone Encounter (Signed)
Ok to let pt know he could do the shot, but has less than 6000 out of 350 million persons in the [place] chance of dying from the RSV pneumonia.  I would not take the shot if he is not around young people (such as under 86 yo)

## 2022-02-22 DIAGNOSIS — B351 Tinea unguium: Secondary | ICD-10-CM | POA: Diagnosis not present

## 2022-02-22 DIAGNOSIS — M79674 Pain in right toe(s): Secondary | ICD-10-CM | POA: Diagnosis not present

## 2022-02-22 DIAGNOSIS — L600 Ingrowing nail: Secondary | ICD-10-CM | POA: Diagnosis not present

## 2022-02-22 DIAGNOSIS — L603 Nail dystrophy: Secondary | ICD-10-CM | POA: Diagnosis not present

## 2022-02-22 DIAGNOSIS — M79675 Pain in left toe(s): Secondary | ICD-10-CM | POA: Diagnosis not present

## 2022-02-22 DIAGNOSIS — I739 Peripheral vascular disease, unspecified: Secondary | ICD-10-CM | POA: Diagnosis not present

## 2022-02-28 LAB — LAB REPORT - SCANNED
A1c: 6.3
EGFR: 31
TSH: 6.58 — AB (ref 0.41–5.90)

## 2022-03-09 ENCOUNTER — Other Ambulatory Visit (HOSPITAL_BASED_OUTPATIENT_CLINIC_OR_DEPARTMENT_OTHER): Payer: Self-pay

## 2022-03-09 MED ORDER — AREXVY 120 MCG/0.5ML IM SUSR
INTRAMUSCULAR | 0 refills | Status: DC
  Filled 2022-03-09: qty 0.5, 1d supply, fill #0

## 2022-04-25 ENCOUNTER — Ambulatory Visit (INDEPENDENT_AMBULATORY_CARE_PROVIDER_SITE_OTHER): Payer: PPO | Admitting: Internal Medicine

## 2022-04-25 VITALS — BP 126/68 | HR 76 | Temp 97.8°F | Ht 72.0 in | Wt 185.0 lb

## 2022-04-25 DIAGNOSIS — R739 Hyperglycemia, unspecified: Secondary | ICD-10-CM

## 2022-04-25 DIAGNOSIS — E890 Postprocedural hypothyroidism: Secondary | ICD-10-CM | POA: Diagnosis not present

## 2022-04-25 DIAGNOSIS — M791 Myalgia, unspecified site: Secondary | ICD-10-CM

## 2022-04-25 DIAGNOSIS — J411 Mucopurulent chronic bronchitis: Secondary | ICD-10-CM

## 2022-04-25 DIAGNOSIS — I10 Essential (primary) hypertension: Secondary | ICD-10-CM

## 2022-04-25 DIAGNOSIS — E559 Vitamin D deficiency, unspecified: Secondary | ICD-10-CM | POA: Diagnosis not present

## 2022-04-25 DIAGNOSIS — G629 Polyneuropathy, unspecified: Secondary | ICD-10-CM | POA: Diagnosis not present

## 2022-04-25 DIAGNOSIS — E538 Deficiency of other specified B group vitamins: Secondary | ICD-10-CM

## 2022-04-25 DIAGNOSIS — H9190 Unspecified hearing loss, unspecified ear: Secondary | ICD-10-CM | POA: Insufficient documentation

## 2022-04-25 DIAGNOSIS — Z0001 Encounter for general adult medical examination with abnormal findings: Secondary | ICD-10-CM | POA: Diagnosis not present

## 2022-04-25 DIAGNOSIS — E7800 Pure hypercholesterolemia, unspecified: Secondary | ICD-10-CM | POA: Diagnosis not present

## 2022-04-25 DIAGNOSIS — Z461 Encounter for fitting and adjustment of hearing aid: Secondary | ICD-10-CM | POA: Insufficient documentation

## 2022-04-25 DIAGNOSIS — E782 Mixed hyperlipidemia: Secondary | ICD-10-CM | POA: Insufficient documentation

## 2022-04-25 DIAGNOSIS — J45909 Unspecified asthma, uncomplicated: Secondary | ICD-10-CM | POA: Insufficient documentation

## 2022-04-25 DIAGNOSIS — N400 Enlarged prostate without lower urinary tract symptoms: Secondary | ICD-10-CM | POA: Insufficient documentation

## 2022-04-25 DIAGNOSIS — F01B4 Vascular dementia, moderate, with anxiety: Secondary | ICD-10-CM | POA: Diagnosis not present

## 2022-04-25 LAB — HEPATIC FUNCTION PANEL
ALT: 10 U/L (ref 0–53)
AST: 18 U/L (ref 0–37)
Albumin: 4.3 g/dL (ref 3.5–5.2)
Alkaline Phosphatase: 58 U/L (ref 39–117)
Bilirubin, Direct: 0.2 mg/dL (ref 0.0–0.3)
Total Bilirubin: 0.9 mg/dL (ref 0.2–1.2)
Total Protein: 7 g/dL (ref 6.0–8.3)

## 2022-04-25 LAB — BASIC METABOLIC PANEL
BUN: 37 mg/dL — ABNORMAL HIGH (ref 6–23)
CO2: 32 mEq/L (ref 19–32)
Calcium: 9.5 mg/dL (ref 8.4–10.5)
Chloride: 105 mEq/L (ref 96–112)
Creatinine, Ser: 1.91 mg/dL — ABNORMAL HIGH (ref 0.40–1.50)
GFR: 30.13 mL/min — ABNORMAL LOW (ref >60.00)
Glucose, Bld: 101 mg/dL — ABNORMAL HIGH (ref 70–99)
Potassium: 5 mEq/L (ref 3.5–5.1)
Sodium: 143 mEq/L (ref 135–145)

## 2022-04-25 LAB — LIPID PANEL
Cholesterol: 149 mg/dL (ref 0–200)
HDL: 52 mg/dL (ref >39.00)
NonHDL: 97.33
Total CHOL/HDL Ratio: 3
Triglycerides: 207 mg/dL — ABNORMAL HIGH (ref 0.0–149.0)
VLDL: 41.4 mg/dL — ABNORMAL HIGH (ref 0.0–40.0)

## 2022-04-25 LAB — CBC WITH DIFFERENTIAL/PLATELET
Basophils Absolute: 0 10*3/uL (ref 0.0–0.1)
Basophils Relative: 0.5 % (ref 0.0–3.0)
Eosinophils Absolute: 0.3 10*3/uL (ref 0.0–0.7)
Eosinophils Relative: 3.1 % (ref 0.0–5.0)
HCT: 38.5 % — ABNORMAL LOW (ref 39.0–52.0)
Hemoglobin: 12.8 g/dL — ABNORMAL LOW (ref 13.0–17.0)
Lymphocytes Relative: 24.8 % (ref 12.0–46.0)
Lymphs Abs: 2.1 10*3/uL (ref 0.7–4.0)
MCHC: 33.3 g/dL (ref 30.0–36.0)
MCV: 95.1 fl (ref 78.0–100.0)
Monocytes Absolute: 0.7 10*3/uL (ref 0.1–1.0)
Monocytes Relative: 7.7 % (ref 3.0–12.0)
Neutro Abs: 5.5 10*3/uL (ref 1.4–7.7)
Neutrophils Relative %: 63.9 % (ref 43.0–77.0)
Platelets: 255 10*3/uL (ref 150.0–400.0)
RBC: 4.04 Mil/uL — ABNORMAL LOW (ref 4.22–5.81)
RDW: 13.1 % (ref 11.5–15.5)
WBC: 8.7 10*3/uL (ref 4.0–10.5)

## 2022-04-25 LAB — LDL CHOLESTEROL, DIRECT: Direct LDL: 74 mg/dL

## 2022-04-25 LAB — VITAMIN B12: Vitamin B-12: 420 pg/mL (ref 211–911)

## 2022-04-25 LAB — TSH: TSH: 2.55 u[IU]/mL (ref 0.35–5.50)

## 2022-04-25 LAB — HEMOGLOBIN A1C: Hgb A1c MFr Bld: 6.3 % (ref 4.6–6.5)

## 2022-04-25 LAB — VITAMIN D 25 HYDROXY (VIT D DEFICIENCY, FRACTURES): VITD: 36.66 ng/mL (ref 30.00–100.00)

## 2022-04-25 MED ORDER — SOLIFENACIN SUCCINATE 5 MG PO TABS: 5.0000 mg | ORAL_TABLET | Freq: Every day | ORAL | 3 refills | Status: DC

## 2022-04-25 MED ORDER — TAMSULOSIN HCL 0.4 MG PO CAPS: ORAL_CAPSULE | ORAL | 3 refills | Status: DC

## 2022-04-25 NOTE — Patient Instructions (Addendum)
Please have your Shingrix (shingles) shots done at your local pharmacy.  Ok to stay off the amlodipine for now  Please call if you wish to change your mind about further Physical Therapy  Please continue all other medications as before, and refills have been done if requested.  Please have the pharmacy call with any other refills you may need.  Please continue your efforts at being more active, low cholesterol diet, and weight control.  You are otherwise up to date with prevention measures today.  Please keep your appointments with your specialists as you may have planned  Please go to the LAB at the blood drawing area for the tests to be done  You will be contacted by phone if any changes need to be made immediately.  Otherwise, you will receive a letter about your results with an explanation, but please check with MyChart first.  Please remember to sign up for MyChart if you have not done so, as this will be important to you in the future with finding out test results, communicating by private email, and scheduling acute appointments online when needed.  Please make an Appointment to return in 6 months, or sooner if needed, also with Lab Appointment for testing done 3-5 days before at the [facility] (so this is for TWO appointments - please see the scheduling desk as you leave)

## 2022-04-25 NOTE — Progress Notes (Signed)
Patient ID: Stephen Wilkins, male   DOB: July 05, 1927, 89 y.o.   MRN: [id number]         Chief Complaint:: wellness exam and cod feeling feet and legs, htn, copd, hld, low thyroid - with wife today       HPI:  Stephen Wilkins is a 89 y.o. male here for wellness exam; decliens covid booster, but may have shingrix at pharmacy, o/w up to date                        Also Pt denies chest pain, increased sob or doe, wheezing, orthopnea, PND, increased LE swelling, palpitations, dizziness or syncope.   Pt denies polydipsia, polyuria, or new focal neuro s/s.    Pt denies fever, wt loss, night sweats, loss of appetite, or other constitutional symptoms  Not taking amlodipine for some reason, and does not want restart.  Does have cold feeling to feet and legs but legs do not feel cold.to touch.  Denies hyper or hypo thyroid symptoms such as voice, skin or hair change.  Dementia overall stable symptomatically, and not assoc with behavioral changes such as hallucinations, paranoia, or agitation.   Wt Readings from Last 3 Encounters:  04/25/22 185 lb (83.9 kg)  12/21/21 185 lb (83.9 kg)  10/21/21 192 lb 6.4 oz (87.3 kg)   BP Readings from Last 3 Encounters:  04/25/22 126/68  12/21/21 (!) 142/84  10/29/21 111/71   Immunization History  Administered Date(s) Administered   Fluad Quad(high Dose 65+) 12/29/2018, 01/07/2020, 12/24/2020   Influenza Split 01/09/2012   Influenza Whole 03/23/2007, 02/02/2009, 01/13/2010   Influenza, High Dose Seasonal PF 02/07/2014, 01/28/2015, 02/28/2017, 01/19/2018, 01/14/2022   Influenza,inj,Quad PF,6+ Mos 12/05/2012   Moderna Sars-Covid-2 Vaccination 04/16/2019, 05/14/2019, 02/28/2020, 08/01/2020   Pneumococcal Conjugate-13 06/05/2013   Pneumococcal Polysaccharide-23 03/23/2007   Respiratory Syncytial Virus Vaccine,Recomb Aduvanted(Arexvy) 03/09/2022   Td 07/31/2008   Tdap 01/09/2019   Health Maintenance Due  Topic Date Due   Medicare Annual Wellness (AWV)  11/06/2020    COVID-19 Vaccine (5 - 2023-24 season) 12/03/2021      Past Medical History:  Diagnosis Date   ABNORMAL ELECTROCARDIOGRAM 06/21/2007   ALLERGIC RHINITIS 03/23/2007   Anxiety state, unspecified 09/06/2013   ASTHMATIC BRONCHITIS, ACUTE 04/27/2007   BENIGN PROSTATIC HYPERTROPHY 03/23/2007   BRONCHITIS NOT SPECIFIED AS ACUTE OR CHRONIC 04/21/2007   BURSITIS, RIGHT HIP 07/31/2008   COPD (chronic obstructive pulmonary disease) ([place]) 04/19/2016   Cough 01/29/2009   Dementia ([place]) 09/01/2010   ERECTILE DYSFUNCTION 03/23/2007   ERECTILE DYSFUNCTION, ORGANIC 04/17/2009   Esophageal stricture    FREQUENCY, URINARY 04/26/2010   GERD 03/23/2007   HIATAL HERNIA    HYPERLIPIDEMIA 03/23/2007   HYPERTENSION 03/23/2007   MILD COGNITIVE IMPAIRMENT SO STATED 05/19/2010   NEPHROLITHIASIS, HX OF 03/23/2007   PSA, INCREASED 06/27/2008   RASH-NONVESICULAR 03/23/2007   REACTIVE AIRWAY DISEASE 01/14/2010   SCHATZKI'S RING    SCIATICA, RIGHT 04/28/2008   Unspecified hypothyroidism 09/06/2013   Wheezing 04/17/2009   Past Surgical History:  Procedure Laterality Date   ERCP N/A 12/28/2018   Procedure: ENDOSCOPIC RETROGRADE CHOLANGIOPANCREATOGRAPHY (ERCP);  Surgeon: Ladene Artist, MD;  Location: [facility];  Service: Endoscopy;  Laterality: N/A;   REMOVAL OF STONES  12/28/2018   Procedure: REMOVAL OF STONES;  Surgeon: Ladene Artist, MD;  Location: [facility];  Service: Endoscopy;;  balloon sweep   SPHINCTEROTOMY  12/28/2018   Procedure: Joan Mayans;  Surgeon: Ladene Artist, MD;  Location: [facility];  Service: Endoscopy;;   TONSILLECTOMY      reports that he has never smoked. He has never used smokeless tobacco. He reports that he does not drink alcohol and does not use drugs. family history includes Asthma in his sister; Cancer in an other family member; Emphysema in his brother; Hypertension in an other family member; Lung cancer in his brother. Allergies  Allergen Reactions   Atorvastatin      REACTION: myalgia   Doxazosin Mesylate     REACTION: dizziness   Hydrocodone Bit-Homatrop Mbr Other (See Comments)    anxiety   Hydrocodone Anxiety   Current Outpatient Medications on File Prior to Visit  Medication Sig Dispense Refill   albuterol (PROVENTIL) (2.5 MG/3ML) 0.083% nebulizer solution Take 3 mLs (2.5 mg total) by nebulization every 6 (six) hours as needed for wheezing or shortness of breath. 150 mL 2   albuterol (VENTOLIN HFA) 108 (90 Base) MCG/ACT inhaler Inhale 2 puffs into the lungs every 6 (six) hours as needed for wheezing or shortness of breath. 8 g 11   ALPRAZolam (XANAX) 0.5 MG tablet 1/2 - 1 tab by mouth twice per day as needed 60 tablet 2   cetirizine (ZYRTEC) 10 MG tablet Take 1 tablet (10 mg total) by mouth daily. 30 tablet 11   Cholecalciferol (VITAMIN D) 1000 UNITS capsule Take 1,000 Units by mouth every other day.      Cholecalciferol 50 MCG (2000 UT) TABS 1 tab by mouth once daily 30 tablet 99   cyanocobalamin 50 MCG tablet TAKE 100MCG BY MOUTH EVERY 7 DAYS     desoximetasone (TOPICORT) 0.25 % cream APPLY SPARINGLY TO THE AFFECTED AREA TWICE DAILY AS NEEDED AS DIRECTED (Patient taking differently: Apply 1 application  topically 2 (two) times daily. Itching) 30 g 1   fluticasone (FLOVENT HFA) 110 MCG/ACT inhaler INHALE 1 PUFF BY MOUTH TWICE A DAY     Fluticasone Propionate, Inhal, 250 MCG/ACT AEPB Inhale 1 puff into the lungs 2 (two) times daily. 3 each 3   lansoprazole (PREVACID) 30 MG capsule TAKE 1 CAPSULE BY MOUTH ONCE DAILY AT  12  NOON 90 capsule 2   levothyroxine (SYNTHROID) 112 MCG tablet Take 1 tablet by mouth once daily 90 tablet 3   losartan (COZAAR) 100 MG tablet Take 0.5 tablets (50 mg total) by mouth daily. 45 tablet 3   methylPREDNISolone (MEDROL DOSEPAK) 4 MG TBPK tablet Take 24 mg on day 1, 20 mg on day 2, 16 mg on day 3, 12 mg on day 4, 8 mg on day 5, 4 mg on day 6.  Take all tablets in each row at once, do not spread tablets out throughout the day.  21 tablet 0   Multiple Vitamin (MULTIVITAMINS PO) Take 1 tablet by mouth daily.     Multiple Vitamins-Minerals (CENTRUM SILVER PO) Take 1 tablet by mouth daily.      polyethylene glycol powder (MIRALAX) powder Take 1/2 capful by mouth once daily (Patient taking differently: Take 17 g by mouth 2 (two) times a week.) 850 g 5   rosuvastatin (CRESTOR) 10 MG tablet Take 1 tablet (10 mg total) by mouth daily. 90 tablet 3   RSV vaccine recomb adjuvanted (AREXVY) 120 MCG/0.5ML injection Inject into the muscle. 0.5 mL 0   vitamin B-12 (CYANOCOBALAMIN) 100 MCG tablet Take 100 mcg by mouth once a week.     vitamin C (ASCORBIC ACID) 500 MG tablet Take 1,000 mg by mouth daily.  No current facility-administered medications on file prior to visit.        ROS:  All others reviewed and negative.  Objective        PE:  BP 126/68 (BP Location: Right Arm, Patient Position: Sitting, Cuff Size: Large)   Pulse 76   Temp 97.8 F (36.6 C) (Oral)   Ht 6' (1.829 m)   Wt 185 lb (83.9 kg)   SpO2 94%   BMI 25.09 kg/m                 Constitutional: Pt appears in NAD               HENT: Head: NCAT.                Right Ear: External ear normal.                 Left Ear: External ear normal.                Eyes: . Pupils are equal, round, and reactive to light. Conjunctivae and EOM are normal               Nose: without d/c or deformity               Neck: Neck supple. Gross normal ROM               Cardiovascular: Normal rate and regular rhythm.                 Pulmonary/Chest: Effort normal and breath sounds without rales or wheezing.                Abd:  Soft, NT, ND, + BS, no organomegaly               Neurological: Pt is alert. At baseline orientation, motor grossly intact, decreased sens to LT to distal feet and ankles               Skin: Skin is warm. No rashes, no other new lesions, LE edema - none               Psychiatric: Pt behavior is normal without agitation   Micro: none  Cardiac tracings  I have personally interpreted today:  none  Pertinent Radiological findings (summarize): none   Lab Results  Component Value Date   WBC 6.5 10/21/2021   HGB 12.2 (L) 10/21/2021   HCT 36.8 (L) 10/21/2021   PLT 208.0 10/21/2021   GLUCOSE 100 (H) 10/21/2021   CHOL 138 10/21/2021   TRIG 143.0 10/21/2021   HDL 46.40 10/21/2021   LDLDIRECT 138.0 10/01/2020   LDLCALC 63 10/21/2021   ALT 10 10/21/2021   AST 19 10/21/2021   NA 141 10/21/2021   K 5.2 (H) 10/21/2021   CL 104 10/21/2021   CREATININE 1.88 (H) 10/21/2021   BUN 34 (H) 10/21/2021   CO2 30 10/21/2021   TSH 4.22 10/21/2021   PSA 3.94 03/12/2014   HGBA1C 6.4 10/21/2021   Assessment/Plan:  Stephen Wilkins is a 60 y.o. White or Caucasian [1] male with  has a past medical history of ABNORMAL ELECTROCARDIOGRAM (06/21/2007), ALLERGIC RHINITIS (03/23/2007), Anxiety state, unspecified (09/06/2013), ASTHMATIC BRONCHITIS, ACUTE (04/27/2007), BENIGN PROSTATIC HYPERTROPHY (03/23/2007), BRONCHITIS NOT SPECIFIED AS ACUTE OR CHRONIC (04/21/2007), BURSITIS, RIGHT HIP (07/31/2008), COPD (chronic obstructive pulmonary disease) ([place]) (04/19/2016), Cough (01/29/2009), Dementia ([place]) (09/01/2010), ERECTILE DYSFUNCTION (03/23/2007), ERECTILE DYSFUNCTION, ORGANIC (04/17/2009), Esophageal stricture, FREQUENCY, URINARY (04/26/2010), GERD (03/23/2007),  HIATAL HERNIA, HYPERLIPIDEMIA (03/23/2007), HYPERTENSION (03/23/2007), MILD COGNITIVE IMPAIRMENT SO STATED (05/19/2010), NEPHROLITHIASIS, HX OF (03/23/2007), PSA, INCREASED (06/27/2008), RASH-NONVESICULAR (03/23/2007), REACTIVE AIRWAY DISEASE (01/14/2010), SCHATZKI'S RING, SCIATICA, RIGHT (04/28/2008), Unspecified hypothyroidism (09/06/2013), and Wheezing (04/17/2009).  COPD (chronic obstructive pulmonary disease) (HCC) Stable, to continue inhaler prn  Encounter for well adult exam with abnormal findings Age and sex appropriate education and counseling updated with regular exercise and diet Referrals for preventative services -  none needed Immunizations addressed - declines covid booster, for shingrx at pharmacy Smoking counseling  - none needed Evidence for depression or other mood disorder - none significant Most recent labs reviewed. I have personally reviewed and have noted: 1) the patient's medical and social history 2) The patient's current medications and supplements 3) The patient's height, weight, and BMI have been recorded in the chart   Hyperglycemia Lab Results  Component Value Date   HGBA1C 6.4 10/21/2021   Stable, pt to continue current medical treatment  - diet, wt control, excercsie   Hyperlipidemia Lab Results  Component Value Date   LDLCALC 63 10/21/2021   Stable, pt to continue current statin crestor 10 mg qd   Hypothyroidism Lab Results  Component Value Date   TSH 4.22 10/21/2021   Stable, pt to continue levothyroxine 112 mcg qd   Vitamin D deficiency Last vitamin D Lab Results  Component Value Date   VD25OH 45.82 10/21/2021   Stable, cont oral replacement   Peripheral neuropathy Pt reminded of this finding, no evidence for lack of circulation, o/w mild and non painful, cont to follow  Dementia (HCC) Stable overall, to continue as is, declines aricept  Essential hypertension BP Readings from Last 3 Encounters:  04/25/22 126/68  12/21/21 (!) 142/84  10/29/21 111/71   Improved, pt to continue medical treatment losartan 50 mg qd, ok to remain off the amlodipine  Followup: No follow-ups on file.  Cathlean Cower, MD 04/26/2022 4:49 AM [facility]

## 2022-04-26 ENCOUNTER — Encounter: Payer: Self-pay | Admitting: Internal Medicine

## 2022-04-26 LAB — URINALYSIS, ROUTINE W REFLEX MICROSCOPIC
Bilirubin Urine: NEGATIVE
Hgb urine dipstick: NEGATIVE
Ketones, ur: NEGATIVE
Leukocytes,Ua: NEGATIVE
Nitrite: NEGATIVE
Specific Gravity, Urine: 1.02 (ref 1.000–1.030)
Total Protein, Urine: 30 — AB
Urine Glucose: NEGATIVE
Urobilinogen, UA: 0.2 (ref 0.0–1.0)
pH: 6.5 (ref 5.0–8.0)

## 2022-04-26 NOTE — Assessment & Plan Note (Signed)
Last vitamin D Lab Results  Component Value Date   VD25OH 45.82 10/21/2021   Stable, cont oral replacement

## 2022-04-26 NOTE — Assessment & Plan Note (Signed)
BP Readings from Last 3 Encounters:  04/25/22 126/68  12/21/21 (!) 142/84  10/29/21 111/71   Improved, pt to continue medical treatment losartan 50 mg qd, ok to remain off the amlodipine

## 2022-04-26 NOTE — Assessment & Plan Note (Signed)
Lab Results  Component Value Date   HGBA1C 6.4 10/21/2021   Stable, pt to continue current medical treatment  - diet, wt control, excercsie

## 2022-04-26 NOTE — Assessment & Plan Note (Signed)
Pt reminded of this finding, no evidence for lack of circulation, o/w mild and non painful, cont to follow

## 2022-04-26 NOTE — Assessment & Plan Note (Signed)
Lab Results  Component Value Date   LDLCALC 63 10/21/2021   Stable, pt to continue current statin crestor 10 mg qd  

## 2022-04-26 NOTE — Assessment & Plan Note (Signed)
Stable overall, to continue as is, declines aricept

## 2022-04-26 NOTE — Assessment & Plan Note (Signed)
Age and sex appropriate education and counseling updated with regular exercise and diet Referrals for preventative services - none needed Immunizations addressed - declines covid booster, for shingrx at pharmacy Smoking counseling  - none needed Evidence for depression or other mood disorder - none significant Most recent labs reviewed. I have personally reviewed and have noted: 1) the patient's medical and social history 2) The patient's current medications and supplements 3) The patient's height, weight, and BMI have been recorded in the chart  

## 2022-04-26 NOTE — Assessment & Plan Note (Signed)
Stable, to continue inhaler prn

## 2022-04-26 NOTE — Assessment & Plan Note (Signed)
Lab Results  Component Value Date   TSH 4.22 10/21/2021   Stable, pt to continue levothyroxine 112 mcg qd

## 2022-05-07 ENCOUNTER — Other Ambulatory Visit: Payer: Self-pay | Admitting: Internal Medicine

## 2022-05-07 NOTE — Telephone Encounter (Signed)
Please refill as per office routine med refill policy (all routine meds to be refilled for 3 mo or monthly (per pt preference) up to one year from last visit, then month to month grace period for 3 mo, then further med refills will have to be denied) ? ?

## 2022-06-02 ENCOUNTER — Ambulatory Visit (INDEPENDENT_AMBULATORY_CARE_PROVIDER_SITE_OTHER): Payer: PPO | Admitting: Internal Medicine

## 2022-06-02 ENCOUNTER — Encounter: Payer: Self-pay | Admitting: Internal Medicine

## 2022-06-02 VITALS — BP 130/68 | HR 78 | Temp 97.8°F | Ht 72.0 in | Wt 186.0 lb

## 2022-06-02 DIAGNOSIS — R739 Hyperglycemia, unspecified: Secondary | ICD-10-CM

## 2022-06-02 DIAGNOSIS — H1820 Unspecified corneal edema: Secondary | ICD-10-CM | POA: Insufficient documentation

## 2022-06-02 DIAGNOSIS — J4551 Severe persistent asthma with (acute) exacerbation: Secondary | ICD-10-CM | POA: Diagnosis not present

## 2022-06-02 DIAGNOSIS — E7800 Pure hypercholesterolemia, unspecified: Secondary | ICD-10-CM | POA: Diagnosis not present

## 2022-06-02 DIAGNOSIS — I10 Essential (primary) hypertension: Secondary | ICD-10-CM | POA: Diagnosis not present

## 2022-06-02 DIAGNOSIS — N184 Chronic kidney disease, stage 4 (severe): Secondary | ICD-10-CM | POA: Diagnosis not present

## 2022-06-02 MED ORDER — ALBUTEROL SULFATE (2.5 MG/3ML) 0.083% IN NEBU: 2.5000 mg | INHALATION_SOLUTION | Freq: Four times a day (QID) | RESPIRATORY_TRACT | 2 refills | Status: DC | PRN

## 2022-06-02 MED ORDER — METHYLPREDNISOLONE ACETATE 80 MG/ML IJ SUSP
80.0000 mg | Freq: Once | INTRAMUSCULAR | Status: AC
  Administered 2022-06-02: 80 mg via INTRAMUSCULAR

## 2022-06-02 MED ORDER — PREDNISONE 10 MG PO TABS: ORAL_TABLET | ORAL | 0 refills | Status: DC

## 2022-06-02 MED ORDER — HYDROCODONE BIT-HOMATROP MBR 5-1.5 MG/5ML PO SOLN: 5.0000 mL | Freq: Four times a day (QID) | ORAL | 0 refills | Status: AC | PRN

## 2022-06-02 NOTE — Progress Notes (Signed)
Patient ID: Stephen Wilkins, male   DOB: 1927/12/07, 89 y.o.   MRN: [id number]        Chief Complaint: follow up asthma exacerbation, htn, ckd4, hyperglycemia, hld       HPI:  Stephen Wilkins is a 89 y.o. male here with son with c/o 4 days onset gradually worsening non prod cough, wheezing, sob doe not improved with increased inhaler use or otc cough med.  Pt denies chest pain, orthopnea, PND, increased LE swelling, palpitations, dizziness or syncope.   Pt denies polydipsia, polyuria, or new focal neuro s/s.    Pt denies fever, wt loss, night sweats, loss of appetite, or other constitutional symptoms  No sick contacts.         Wt Readings from Last 3 Encounters:  06/02/22 186 lb (84.4 kg)  04/25/22 185 lb (83.9 kg)  12/21/21 185 lb (83.9 kg)   BP Readings from Last 3 Encounters:  06/02/22 130/68  04/25/22 126/68  12/21/21 (!) 142/84         Past Medical History:  Diagnosis Date   ABNORMAL ELECTROCARDIOGRAM 06/21/2007   ALLERGIC RHINITIS 03/23/2007   Anxiety state, unspecified 09/06/2013   ASTHMATIC BRONCHITIS, ACUTE 04/27/2007   BENIGN PROSTATIC HYPERTROPHY 03/23/2007   BRONCHITIS NOT SPECIFIED AS ACUTE OR CHRONIC 04/21/2007   BURSITIS, RIGHT HIP 07/31/2008   COPD (chronic obstructive pulmonary disease) ([place]) 04/19/2016   Cough 01/29/2009   Dementia ([place]) 09/01/2010   ERECTILE DYSFUNCTION 03/23/2007   ERECTILE DYSFUNCTION, ORGANIC 04/17/2009   Esophageal stricture    FREQUENCY, URINARY 04/26/2010   GERD 03/23/2007   HIATAL HERNIA    HYPERLIPIDEMIA 03/23/2007   HYPERTENSION 03/23/2007   MILD COGNITIVE IMPAIRMENT SO STATED 05/19/2010   NEPHROLITHIASIS, HX OF 03/23/2007   PSA, INCREASED 06/27/2008   RASH-NONVESICULAR 03/23/2007   REACTIVE AIRWAY DISEASE 01/14/2010   SCHATZKI'S RING    SCIATICA, RIGHT 04/28/2008   Unspecified hypothyroidism 09/06/2013   Wheezing 04/17/2009   Past Surgical History:  Procedure Laterality Date   ERCP N/A 12/28/2018   Procedure: ENDOSCOPIC RETROGRADE  CHOLANGIOPANCREATOGRAPHY (ERCP);  Surgeon: Ladene Artist, MD;  Location: [facility];  Service: Endoscopy;  Laterality: N/A;   REMOVAL OF STONES  12/28/2018   Procedure: REMOVAL OF STONES;  Surgeon: Ladene Artist, MD;  Location: [facility];  Service: Endoscopy;;  balloon sweep   SPHINCTEROTOMY  12/28/2018   Procedure: Joan Mayans;  Surgeon: Ladene Artist, MD;  Location: [facility];  Service: Endoscopy;;   TONSILLECTOMY      reports that he has never smoked. He has never used smokeless tobacco. He reports that he does not drink alcohol and does not use drugs. family history includes Asthma in his sister; Cancer in an other family member; Emphysema in his brother; Hypertension in an other family member; Lung cancer in his brother. Allergies  Allergen Reactions   Atorvastatin     REACTION: myalgia   Doxazosin Mesylate     REACTION: dizziness   Hydrocodone Bit-Homatrop Mbr Other (See Comments)    anxiety   Hydrocodone Anxiety   Current Outpatient Medications on File Prior to Visit  Medication Sig Dispense Refill   albuterol (VENTOLIN HFA) 108 (90 Base) MCG/ACT inhaler Inhale 2 puffs into the lungs every 6 (six) hours as needed for wheezing or shortness of breath. 8 g 11   ALPRAZolam (XANAX) 0.5 MG tablet 1/2 - 1 tab by mouth twice per day as needed 60 tablet 2   aspirin EC 81 MG tablet Take by mouth.  budesonide-formoterol (SYMBICORT) 80-4.5 MCG/ACT inhaler      cetirizine (ZYRTEC) 10 MG tablet Take 1 tablet (10 mg total) by mouth daily. 30 tablet 11   Cholecalciferol (VITAMIN D) 1000 UNITS capsule Take 1,000 Units by mouth every other day.      Cholecalciferol 50 MCG (2000 UT) TABS 1 tab by mouth once daily 30 tablet 99   cyanocobalamin 50 MCG tablet TAKE 100MCG BY MOUTH EVERY 7 DAYS     desoximetasone (TOPICORT) 0.25 % cream APPLY SPARINGLY TO THE AFFECTED AREA TWICE DAILY AS NEEDED AS DIRECTED (Patient taking differently: Apply 1 application  topically 2 (two) times  daily. Itching) 30 g 1   fluticasone (FLOVENT HFA) 110 MCG/ACT inhaler INHALE 1 PUFF BY MOUTH TWICE A DAY     Fluticasone Propionate, Inhal, 250 MCG/ACT AEPB Inhale 1 puff into the lungs 2 (two) times daily. 3 each 3   lansoprazole (PREVACID) 30 MG capsule TAKE 1 CAPSULE BY MOUTH ONCE DAILY AT  12  NOON 90 capsule 2   levothyroxine (SYNTHROID) 112 MCG tablet Take 1 tablet by mouth once daily 90 tablet 3   losartan (COZAAR) 100 MG tablet Take 0.5 tablets (50 mg total) by mouth daily. 45 tablet 3   LYCOPENE PO Take by mouth.     methylPREDNISolone (MEDROL DOSEPAK) 4 MG TBPK tablet Take 24 mg on day 1, 20 mg on day 2, 16 mg on day 3, 12 mg on day 4, 8 mg on day 5, 4 mg on day 6.  Take all tablets in each row at once, do not spread tablets out throughout the day. 21 tablet 0   Multiple Vitamin (MULTIVITAMINS PO) Take 1 tablet by mouth daily.     Multiple Vitamins-Minerals (CENTRUM SILVER PO) Take 1 tablet by mouth daily.      polyethylene glycol powder (MIRALAX) powder Take 1/2 capful by mouth once daily (Patient taking differently: Take 17 g by mouth 2 (two) times a week.) 850 g 5   rosuvastatin (CRESTOR) 10 MG tablet Take 1 tablet by mouth once daily 90 tablet 0   RSV vaccine recomb adjuvanted (AREXVY) 120 MCG/0.5ML injection Inject into the muscle. 0.5 mL 0   sodium chloride (MURO 128) 5 % ophthalmic solution INSTILL 1 DROP IN BOTH EYES FOUR TIMES A DAY FOR CORNEAL EDEMA     solifenacin (VESICARE) 5 MG tablet Take 1 tablet (5 mg total) by mouth daily. 90 tablet 3   tamsulosin (FLOMAX) 0.4 MG CAPS capsule TAKE 1 CAPSULE(0.4 MG) BY MOUTH AT BEDTIME 90 capsule 3   vitamin B-12 (CYANOCOBALAMIN) 100 MCG tablet Take 100 mcg by mouth once a week.     vitamin C (ASCORBIC ACID) 500 MG tablet Take 1,000 mg by mouth daily.      No current facility-administered medications on file prior to visit.        ROS:  All others reviewed and negative.  Objective        PE:  BP 130/68 (BP Location: Left Arm,  Patient Position: Sitting, Cuff Size: Large)   Pulse 78   Temp 97.8 F (36.6 C) (Oral)   Ht 6' (1.829 m)   Wt 186 lb (84.4 kg)   SpO2 92%   BMI 25.23 kg/m                 Constitutional: Pt appears not ill appearing, but fatigued               HENT: Head: NCAT.  Right Ear: External ear normal.                 Left Ear: External ear normal.                Eyes: . Pupils are equal, round, and reactive to light. Conjunctivae and EOM are normal               Nose: without d/c or deformity               Neck: Neck supple. Gross normal ROM               Cardiovascular: Normal rate and regular rhythm.                 Pulmonary/Chest: Effort normal and breath sounds without rales but with few bilateral wheezing.                Abd:  Soft, NT, ND, + BS, no organomegaly               Neurological: Pt is alert. At baseline orientation, motor grossly intact               Skin: Skin is warm. No rashes, no other new lesions, LE edema - none               Psychiatric: Pt behavior is normal without agitation   Micro: none  Cardiac tracings I have personally interpreted today:  none  Pertinent Radiological findings (summarize): none   Lab Results  Component Value Date   WBC 8.7 04/25/2022   HGB 12.8 (L) 04/25/2022   HCT 38.5 (L) 04/25/2022   PLT 255.0 04/25/2022   GLUCOSE 101 (H) 04/25/2022   CHOL 149 04/25/2022   TRIG 207.0 (H) 04/25/2022   HDL 52.00 04/25/2022   LDLDIRECT 74.0 04/25/2022   LDLCALC 63 10/21/2021   ALT 10 04/25/2022   AST 18 04/25/2022   NA 143 04/25/2022   K 5.0 04/25/2022   CL 105 04/25/2022   CREATININE 1.91 (H) 04/25/2022   BUN 37 (H) 04/25/2022   CO2 32 04/25/2022   TSH 2.55 04/25/2022   PSA 3.94 03/12/2014   HGBA1C 6.3 04/25/2022   Assessment/Plan:  Stephen Wilkins is a 48 y.o. White or Caucasian [1] male with  has a past medical history of ABNORMAL ELECTROCARDIOGRAM (06/21/2007), ALLERGIC RHINITIS (03/23/2007), Anxiety state, unspecified  (09/06/2013), ASTHMATIC BRONCHITIS, ACUTE (04/27/2007), BENIGN PROSTATIC HYPERTROPHY (03/23/2007), BRONCHITIS NOT SPECIFIED AS ACUTE OR CHRONIC (04/21/2007), BURSITIS, RIGHT HIP (07/31/2008), COPD (chronic obstructive pulmonary disease) ([place]) (04/19/2016), Cough (01/29/2009), Dementia ([place]) (09/01/2010), ERECTILE DYSFUNCTION (03/23/2007), ERECTILE DYSFUNCTION, ORGANIC (04/17/2009), Esophageal stricture, FREQUENCY, URINARY (04/26/2010), GERD (03/23/2007), HIATAL HERNIA, HYPERLIPIDEMIA (03/23/2007), HYPERTENSION (03/23/2007), MILD COGNITIVE IMPAIRMENT SO STATED (05/19/2010), NEPHROLITHIASIS, HX OF (03/23/2007), PSA, INCREASED (06/27/2008), RASH-NONVESICULAR (03/23/2007), REACTIVE AIRWAY DISEASE (01/14/2010), SCHATZKI'S RING, SCIATICA, RIGHT (04/28/2008), Unspecified hypothyroidism (09/06/2013), and Wheezing (04/17/2009).  CKD (chronic kidney disease) Lab Results  Component Value Date   CREATININE 1.91 (H) 04/25/2022   Stable overall, cont to avoid nephrotoxins  Essential hypertension BP Readings from Last 3 Encounters:  06/02/22 130/68  04/25/22 126/68  12/21/21 (!) 142/84   Stable, pt to continue medical treatment losartan 100 mg qd   Hyperglycemia Lab Results  Component Value Date   HGBA1C 6.3 04/25/2022   Stable, pt to continue current medical treatment  - diet, wt control   Hyperlipidemia Lab Results  Component Value Date   LDLCALC 63 10/21/2021   Stable, pt to  continue current statin crestor 10 mg qd   Severe persistent asthma with exacerbation Moderate, for depomedrol 80 mg IM, prednisone taper asd, cough med prn  Followup: Return if symptoms worsen or fail to improve.  Cathlean Cower, MD 06/05/2022 11:55 AM [facility]

## 2022-06-02 NOTE — Patient Instructions (Addendum)
You had the steroid shot today  Please take all new medication as prescribed - the prednisone, and the cough medicine as needed  Ok to use the nebulizer up to 4 times per day  Please continue all other medications as before, and refills have been done if requested.  Please have the pharmacy call with any other refills you may need.  Please keep your appointments with your specialists as you may have planned

## 2022-06-05 ENCOUNTER — Encounter: Payer: Self-pay | Admitting: Internal Medicine

## 2022-06-05 DIAGNOSIS — J4551 Severe persistent asthma with (acute) exacerbation: Secondary | ICD-10-CM | POA: Insufficient documentation

## 2022-06-05 NOTE — Assessment & Plan Note (Signed)
Lab Results  Component Value Date   CREATININE 1.91 (H) 04/25/2022   Stable overall, cont to avoid nephrotoxins

## 2022-06-05 NOTE — Assessment & Plan Note (Signed)
Moderate, for depomedrol 80 mg IM, prednisone taper asd, cough med prn

## 2022-06-05 NOTE — Assessment & Plan Note (Signed)
BP Readings from Last 3 Encounters:  06/02/22 130/68  04/25/22 126/68  12/21/21 (!) 142/84   Stable, pt to continue medical treatment losartan 100 mg qd

## 2022-06-05 NOTE — Assessment & Plan Note (Signed)
Lab Results  Component Value Date   LDLCALC 63 10/21/2021   Stable, pt to continue current statin crestor 10 mg qd

## 2022-06-05 NOTE — Assessment & Plan Note (Signed)
Lab Results  Component Value Date   HGBA1C 6.3 04/25/2022   Stable, pt to continue current medical treatment  - diet, wt control

## 2022-06-09 ENCOUNTER — Encounter (HOSPITAL_BASED_OUTPATIENT_CLINIC_OR_DEPARTMENT_OTHER): Payer: Self-pay | Admitting: Pediatrics

## 2022-06-09 ENCOUNTER — Emergency Department (HOSPITAL_BASED_OUTPATIENT_CLINIC_OR_DEPARTMENT_OTHER): Payer: PPO

## 2022-06-09 ENCOUNTER — Emergency Department (HOSPITAL_BASED_OUTPATIENT_CLINIC_OR_DEPARTMENT_OTHER)
Admission: EM | Admit: 2022-06-09 | Discharge: 2022-06-09 | Disposition: A | Discharge status: Home / Self Care | Payer: PPO | Attending: Emergency Medicine | Admitting: Emergency Medicine

## 2022-06-09 ENCOUNTER — Other Ambulatory Visit: Payer: Self-pay

## 2022-06-09 DIAGNOSIS — W010XXA Fall on same level from slipping, tripping and stumbling without subsequent striking against object, initial encounter: Secondary | ICD-10-CM | POA: Insufficient documentation

## 2022-06-09 DIAGNOSIS — S299XXA Unspecified injury of thorax, initial encounter: Secondary | ICD-10-CM | POA: Diagnosis present

## 2022-06-09 DIAGNOSIS — R0781 Pleurodynia: Secondary | ICD-10-CM | POA: Diagnosis not present

## 2022-06-09 DIAGNOSIS — Z7951 Long term (current) use of inhaled steroids: Secondary | ICD-10-CM | POA: Diagnosis not present

## 2022-06-09 DIAGNOSIS — J449 Chronic obstructive pulmonary disease, unspecified: Secondary | ICD-10-CM | POA: Diagnosis not present

## 2022-06-09 DIAGNOSIS — Z7982 Long term (current) use of aspirin: Secondary | ICD-10-CM | POA: Diagnosis not present

## 2022-06-09 DIAGNOSIS — S20212A Contusion of left front wall of thorax, initial encounter: Secondary | ICD-10-CM | POA: Diagnosis not present

## 2022-06-09 MED ORDER — LIDOCAINE 5 % EX PTCH: 1.0000 | MEDICATED_PATCH | CUTANEOUS | 0 refills | Status: DC

## 2022-06-09 NOTE — ED Provider Notes (Signed)
[facility] Provider Note   CSN: [id number] Arrival date & time: 06/09/22  1332     History  Chief Complaint  Patient presents with   Lytle Michaels    Stephen Wilkins is a 89 y.o. male history of BPH, COPD, hiatal hernia here presenting with chest wall pain.  Patient states that he was trying to reach the ceiling light to turn off the fan and slipped and fell and hit the left chest wall on the sofa.  He has been taking Tylenol and Motrin and states that it has been helping but he still has some pain when he takes a deep breath.  Patient denies any chest pain.  Patient denies any fevers.  He is not on any blood thinners.  He wants to be checked out to make sure he has no fractures  The history is provided by the patient.       Home Medications Prior to Admission medications   Medication Sig Start Date End Date Taking? Authorizing Provider  albuterol (PROVENTIL) (2.5 MG/3ML) 0.083% nebulizer solution Take 3 mLs (2.5 mg total) by nebulization every 6 (six) hours as needed for wheezing or shortness of breath. 06/02/22   Biagio Borg, MD  albuterol (VENTOLIN HFA) 108 (90 Base) MCG/ACT inhaler Inhale 2 puffs into the lungs every 6 (six) hours as needed for wheezing or shortness of breath. 07/15/21   Biagio Borg, MD  ALPRAZolam Duanne Moron) 0.5 MG tablet 1/2 - 1 tab by mouth twice per day as needed 09/22/20   Biagio Borg, MD  aspirin EC 81 MG tablet Take by mouth. 02/10/16   [provider]  budesonide-formoterol (SYMBICORT) 80-4.5 MCG/ACT inhaler  05/09/17   [provider]  cetirizine (ZYRTEC) 10 MG tablet Take 1 tablet (10 mg total) by mouth daily. 12/17/21 12/17/22  Biagio Borg, MD  Cholecalciferol (VITAMIN D) 1000 UNITS capsule Take 1,000 Units by mouth every other day.     [provider]  Cholecalciferol 50 MCG (2000 UT) TABS 1 tab by mouth once daily 04/16/21   Biagio Borg, MD  cyanocobalamin 50 MCG tablet TAKE 100MCG BY MOUTH  EVERY 7 DAYS 03/02/22   [provider]  desoximetasone (TOPICORT) 0.25 % cream APPLY SPARINGLY TO THE AFFECTED AREA TWICE DAILY AS NEEDED AS DIRECTED Patient taking differently: Apply 1 application  topically 2 (two) times daily. Itching 04/30/18   Biagio Borg, MD  fluticasone (FLOVENT HFA) 110 MCG/ACT inhaler INHALE 1 PUFF BY MOUTH TWICE A DAY 03/02/22   [provider]  Fluticasone Propionate, Inhal, 250 MCG/ACT AEPB Inhale 1 puff into the lungs 2 (two) times daily. 10/29/21 10/24/22  Lynden Oxford Scales, PA-C  HYDROcodone bit-homatropine (HYCODAN) 5-1.5 MG/5ML syrup Take 5 mLs by mouth every 6 (six) hours as needed for up to 10 days. 06/02/22 06/12/22  Biagio Borg, MD  lansoprazole (PREVACID) 30 MG capsule TAKE 1 CAPSULE BY MOUTH ONCE DAILY AT  12  NOON 02/22/21   Biagio Borg, MD  levothyroxine (SYNTHROID) 112 MCG tablet Take 1 tablet by mouth once daily 07/07/21   Biagio Borg, MD  losartan (COZAAR) 100 MG tablet Take 0.5 tablets (50 mg total) by mouth daily. 04/16/21   Biagio Borg, MD  LYCOPENE PO Take by mouth. 03/04/19   [provider]  methylPREDNISolone (MEDROL DOSEPAK) 4 MG TBPK tablet Take 24 mg on day 1, 20 mg on day 2, 16 mg on day 3,  12 mg on day 4, 8 mg on day 5, 4 mg on day 6.  Take all tablets in each row at once, do not spread tablets out throughout the day. 10/29/21   Lynden Oxford Scales, PA-C  Multiple Vitamin (MULTIVITAMINS PO) Take 1 tablet by mouth daily. 03/02/22   [provider]  Multiple Vitamins-Minerals (CENTRUM SILVER PO) Take 1 tablet by mouth daily.     [provider]  polyethylene glycol powder (MIRALAX) powder Take 1/2 capful by mouth once daily Patient taking differently: Take 17 g by mouth 2 (two) times a week. 01/28/15   Biagio Borg, MD  predniSONE (DELTASONE) 10 MG tablet 3 tabs by mouth per day for 3 days,2tabs per day for 3 days,1tab per day for 3 days 06/02/22   Biagio Borg, MD  rosuvastatin (CRESTOR) 10  MG tablet Take 1 tablet by mouth once daily 05/09/22   Biagio Borg, MD  RSV vaccine recomb adjuvanted (AREXVY) 120 MCG/0.5ML injection Inject into the muscle. 03/09/22   Carlyle Basques, MD  sodium chloride (MURO 128) 5 % ophthalmic solution INSTILL 1 DROP IN BOTH EYES FOUR TIMES A DAY FOR CORNEAL EDEMA 05/24/22   [provider]  solifenacin (VESICARE) 5 MG tablet Take 1 tablet (5 mg total) by mouth daily. 04/25/22   Biagio Borg, MD  tamsulosin (FLOMAX) 0.4 MG CAPS capsule TAKE 1 CAPSULE(0.4 MG) BY MOUTH AT BEDTIME 04/25/22   Biagio Borg, MD  vitamin B-12 (CYANOCOBALAMIN) 100 MCG tablet Take 100 mcg by mouth once a week.    [provider]  vitamin C (ASCORBIC ACID) 500 MG tablet Take 1,000 mg by mouth daily.     [provider]      Allergies    Atorvastatin, Doxazosin mesylate, Hydrocodone bit-homatrop mbr, and Hydrocodone    Review of Systems   Review of Systems  Musculoskeletal:        Left rib pain  All other systems reviewed and are negative.   Physical Exam Updated Vital Signs BP 120/77 (BP Location: Right Arm)   Pulse 78   Temp 97.8 F (36.6 C) (Oral)   Resp 18   Ht 6' (1.829 m)   Wt 84.4 kg   SpO2 96%   BMI 25.23 kg/m  Physical Exam Vitals and nursing note reviewed.  Constitutional:      Appearance: Normal appearance.  HENT:     Head: Normocephalic and atraumatic.     Nose: Nose normal.     Mouth/Throat:     Mouth: Mucous membranes are moist.  Eyes:     Extraocular Movements: Extraocular movements intact.     Pupils: Pupils are equal, round, and reactive to light.  Cardiovascular:     Rate and Rhythm: Normal rate and regular rhythm.     Pulses: Normal pulses.     Heart sounds: Normal heart sounds.  Pulmonary:     Effort: Pulmonary effort is normal.     Breath sounds: Normal breath sounds.     Comments: Tenderness in the left chest wall.  There is no ecchymosis.  No obvious deformity or flail chest Abdominal:     General: Abdomen  is flat.     Palpations: Abdomen is soft.  Musculoskeletal:        General: Normal range of motion.     Cervical back: Normal range of motion and neck supple.  Skin:    General: Skin is warm.     Capillary Refill: Capillary refill takes  less than 2 seconds.  Neurological:     General: No focal deficit present.     Mental Status: He is alert and oriented to person, place, and time.  Psychiatric:        Mood and Affect: Mood normal.        Behavior: Behavior normal.     ED Results / Procedures / Treatments   Labs (all labs ordered are listed, but only abnormal results are displayed) Labs Reviewed - No data to display  EKG None  Radiology DG Ribs Unilateral W/Chest Left  Result Date: 06/09/2022 CLINICAL DATA:  Left chest pain after fall. EXAM: LEFT RIBS AND CHEST - 3+ VIEW COMPARISON:  December 21, 2021. FINDINGS: No fracture or other bone lesions are seen involving the ribs. There is no evidence of pneumothorax or pleural effusion. Both lungs are clear. Heart size and mediastinal contours are within normal limits. IMPRESSION: Negative. Electronically Signed   By: Marijo Conception M.D.   On: 06/09/2022 14:30    Procedures Procedures    Medications Ordered in ED Medications - No data to display  ED Course/ Medical Decision Making/ A&P                             Medical Decision Making Stephen Wilkins is a 89 y.o. male here presenting with chest wall injury.  Patient had an injury about 4 days ago.  Patient is not hypoxic.  Patient is well-appearing.  X-ray showed no fracture or pneumothorax or hemothorax.  Stable for discharge.  Will give incentive spirometer and told him to continue Tylenol Motrin and I will add lidocaine patch.   Problems Addressed: Contusion of left chest wall, initial encounter: acute illness or injury  Amount and/or Complexity of Data Reviewed Radiology: ordered and independent interpretation performed. Decision-making details documented in ED  Course.    Final Clinical Impression(s) / ED Diagnoses Final diagnoses:  None    Rx / DC Orders ED Discharge Orders     None         Drenda Freeze, MD 06/09/22 1615

## 2022-06-09 NOTE — Discharge Instructions (Signed)
As we discussed, your x-rays did not show any fracture or pneumonias.  I recommend that you use incentive spirometer every 2 hours during the day to help prevent pneumonia  Take Tylenol or Motrin for pain and use lidocaine patch as needed  See your doctor for follow-up  Return to ER if you have worse chest pain, shortness of breath, fever

## 2022-06-09 NOTE — ED Triage Notes (Signed)
Reported tripped and fall on Sunday night and hit chest / breast area on left side against the wooden part of the armrest. -head injury, -thinners

## 2022-06-21 DIAGNOSIS — M79674 Pain in right toe(s): Secondary | ICD-10-CM | POA: Diagnosis not present

## 2022-06-21 DIAGNOSIS — L600 Ingrowing nail: Secondary | ICD-10-CM | POA: Diagnosis not present

## 2022-06-21 DIAGNOSIS — B351 Tinea unguium: Secondary | ICD-10-CM | POA: Diagnosis not present

## 2022-06-21 DIAGNOSIS — I739 Peripheral vascular disease, unspecified: Secondary | ICD-10-CM | POA: Diagnosis not present

## 2022-06-21 DIAGNOSIS — M79675 Pain in left toe(s): Secondary | ICD-10-CM | POA: Diagnosis not present

## 2022-06-21 DIAGNOSIS — L603 Nail dystrophy: Secondary | ICD-10-CM | POA: Diagnosis not present

## 2022-06-23 ENCOUNTER — Telehealth: Payer: Self-pay

## 2022-06-23 NOTE — Telephone Encounter (Signed)
Called patient to schedule Medicare Annual Wellness Visit (AWV). Unable to reach patient.  Last date of AWV: 11/07/19  Please schedule an appointment at any time with [facility].   Norton Blizzard, [place] ([facility])  [facility] [phone]

## 2022-06-28 ENCOUNTER — Encounter: Payer: Self-pay | Admitting: Internal Medicine

## 2022-06-28 ENCOUNTER — Emergency Department (HOSPITAL_COMMUNITY)
Admission: EM | Admit: 2022-06-28 | Discharge: 2022-06-28 | Disposition: A | Discharge status: Home / Self Care | Payer: PPO | Attending: Emergency Medicine | Admitting: Emergency Medicine

## 2022-06-28 ENCOUNTER — Emergency Department (HOSPITAL_COMMUNITY): Payer: PPO

## 2022-06-28 ENCOUNTER — Other Ambulatory Visit: Payer: Self-pay

## 2022-06-28 ENCOUNTER — Ambulatory Visit (INDEPENDENT_AMBULATORY_CARE_PROVIDER_SITE_OTHER): Payer: PPO | Admitting: Internal Medicine

## 2022-06-28 VITALS — BP 136/84 | HR 103 | Temp 97.8°F | Ht 72.0 in | Wt 181.4 lb

## 2022-06-28 DIAGNOSIS — J9811 Atelectasis: Secondary | ICD-10-CM | POA: Diagnosis not present

## 2022-06-28 DIAGNOSIS — Z1152 Encounter for screening for COVID-19: Secondary | ICD-10-CM | POA: Diagnosis not present

## 2022-06-28 DIAGNOSIS — J4551 Severe persistent asthma with (acute) exacerbation: Secondary | ICD-10-CM | POA: Diagnosis not present

## 2022-06-28 DIAGNOSIS — I700 Atherosclerosis of aorta: Secondary | ICD-10-CM | POA: Diagnosis not present

## 2022-06-28 DIAGNOSIS — Z7982 Long term (current) use of aspirin: Secondary | ICD-10-CM | POA: Insufficient documentation

## 2022-06-28 DIAGNOSIS — J9601 Acute respiratory failure with hypoxia: Secondary | ICD-10-CM | POA: Diagnosis not present

## 2022-06-28 DIAGNOSIS — J441 Chronic obstructive pulmonary disease with (acute) exacerbation: Secondary | ICD-10-CM | POA: Diagnosis not present

## 2022-06-28 DIAGNOSIS — R739 Hyperglycemia, unspecified: Secondary | ICD-10-CM

## 2022-06-28 DIAGNOSIS — R0602 Shortness of breath: Secondary | ICD-10-CM | POA: Diagnosis not present

## 2022-06-28 DIAGNOSIS — I7121 Aneurysm of the ascending aorta, without rupture: Secondary | ICD-10-CM | POA: Diagnosis not present

## 2022-06-28 DIAGNOSIS — Z7951 Long term (current) use of inhaled steroids: Secondary | ICD-10-CM | POA: Diagnosis not present

## 2022-06-28 LAB — CBC WITH DIFFERENTIAL/PLATELET
Abs Immature Granulocytes: 0.02 10*3/uL (ref 0.00–0.07)
Basophils Absolute: 0.1 10*3/uL (ref 0.0–0.1)
Basophils Relative: 1 %
Eosinophils Absolute: 0.6 10*3/uL — ABNORMAL HIGH (ref 0.0–0.5)
Eosinophils Relative: 8 %
HCT: 39.4 % (ref 39.0–52.0)
Hemoglobin: 12.5 g/dL — ABNORMAL LOW (ref 13.0–17.0)
Immature Granulocytes: 0 %
Lymphocytes Relative: 20 %
Lymphs Abs: 1.5 10*3/uL (ref 0.7–4.0)
MCH: 31.5 pg (ref 26.0–34.0)
MCHC: 31.7 g/dL (ref 30.0–36.0)
MCV: 99.2 fL (ref 80.0–100.0)
Monocytes Absolute: 0.5 10*3/uL (ref 0.1–1.0)
Monocytes Relative: 7 %
Neutro Abs: 4.9 10*3/uL (ref 1.7–7.7)
Neutrophils Relative %: 64 %
Platelets: 202 10*3/uL (ref 150–400)
RBC: 3.97 MIL/uL — ABNORMAL LOW (ref 4.22–5.81)
RDW: 11.9 % (ref 11.5–15.5)
WBC: 7.7 10*3/uL (ref 4.0–10.5)
nRBC: 0 % (ref 0.0–0.2)

## 2022-06-28 LAB — BASIC METABOLIC PANEL
Anion gap: 9 (ref 5–15)
BUN: 38 mg/dL — ABNORMAL HIGH (ref 8–23)
CO2: 29 mmol/L (ref 22–32)
Calcium: 9.1 mg/dL (ref 8.9–10.3)
Chloride: 102 mmol/L (ref 98–111)
Creatinine, Ser: 1.83 mg/dL — ABNORMAL HIGH (ref 0.61–1.24)
GFR, Estimated: 34 mL/min — ABNORMAL LOW (ref >60)
Glucose, Bld: 119 mg/dL — ABNORMAL HIGH (ref 70–99)
Potassium: 4.6 mmol/L (ref 3.5–5.1)
Sodium: 140 mmol/L (ref 135–145)

## 2022-06-28 LAB — RESP PANEL BY RT-PCR (RSV, FLU A&B, COVID)  RVPGX2
Influenza A by PCR: NEGATIVE
Influenza B by PCR: NEGATIVE
Resp Syncytial Virus by PCR: NEGATIVE
SARS Coronavirus 2 by RT PCR: NEGATIVE

## 2022-06-28 LAB — BRAIN NATRIURETIC PEPTIDE: B Natriuretic Peptide: 53 pg/mL (ref 0.0–100.0)

## 2022-06-28 MED ORDER — PREDNISONE 20 MG PO TABS: 60.0000 mg | ORAL_TABLET | Freq: Every day | ORAL | 0 refills | Status: DC

## 2022-06-28 MED ORDER — ALBUTEROL SULFATE (2.5 MG/3ML) 0.083% IN NEBU: 2.5000 mg | INHALATION_SOLUTION | Freq: Once | RESPIRATORY_TRACT | Status: DC

## 2022-06-28 MED ORDER — MAGNESIUM SULFATE 2 GM/50ML IV SOLN
2.0000 g | Freq: Once | INTRAVENOUS | Status: AC
  Administered 2022-06-28: 2 g via INTRAVENOUS
  Filled 2022-06-28: qty 50

## 2022-06-28 MED ORDER — LOSARTAN POTASSIUM 100 MG PO TABS: 50.0000 mg | ORAL_TABLET | Freq: Every day | ORAL | 3 refills | Status: DC

## 2022-06-28 MED ORDER — DOXYCYCLINE HYCLATE 100 MG PO CAPS: 100.0000 mg | ORAL_CAPSULE | Freq: Two times a day (BID) | ORAL | 0 refills | Status: DC

## 2022-06-28 MED ORDER — METHYLPREDNISOLONE ACETATE 80 MG/ML IJ SUSP
80.0000 mg | Freq: Once | INTRAMUSCULAR | Status: AC
  Administered 2022-06-28: 80 mg via INTRAMUSCULAR

## 2022-06-28 MED ORDER — IPRATROPIUM-ALBUTEROL 0.5-2.5 (3) MG/3ML IN SOLN
3.0000 mL | Freq: Once | RESPIRATORY_TRACT | Status: AC
  Administered 2022-06-28: 3 mL via RESPIRATORY_TRACT
  Filled 2022-06-28: qty 3

## 2022-06-28 NOTE — Assessment & Plan Note (Signed)
Lab Results  Component Value Date   HGBA1C 6.3 04/25/2022   Stable, pt to continue current medical treatment  - diet, wt control  

## 2022-06-28 NOTE — Progress Notes (Signed)
Patient ID: Stephen Wilkins, male   DOB: May 11, 1927, 89 y.o.   MRN: [id number]        Chief Complaint: 3 days cough, wheezing, sob doe fatigue       HPI:  Stephen Wilkins is a 89 y.o. male here with c/o above, has long hx of milder asthma exacerbations, some provoked by less than perfect med compliance and hx of dementia, today here with wife (who walks with walker but is driving and competent) who corroborates his hx, denies fever, may have had a chill or two yesterday, but mostly more difficulty with cough, wheezing, sob doe fatigue x 3 days gradually worsening, despite using albuterol inhaler and nebulizer at home (though admits did not use either this am so far);   Pt denies polydipsia, polyuria, or new focal neuro s/s.   Pt denies chest pain, orthopnea, PND, increased LE swelling, palpitations, dizziness or syncope.  Amb o2 sat is 86% in th office this am       Wt Readings from Last 3 Encounters:  06/28/22 181 lb 6.4 oz (82.3 kg)  06/09/22 186 lb (84.4 kg)  06/02/22 186 lb (84.4 kg)   BP Readings from Last 3 Encounters:  06/28/22 136/84  06/09/22 (!) 153/83  06/02/22 130/68         Past Medical History:  Diagnosis Date   ABNORMAL ELECTROCARDIOGRAM 06/21/2007   ALLERGIC RHINITIS 03/23/2007   Anxiety state, unspecified 09/06/2013   ASTHMATIC BRONCHITIS, ACUTE 04/27/2007   BENIGN PROSTATIC HYPERTROPHY 03/23/2007   BRONCHITIS NOT SPECIFIED AS ACUTE OR CHRONIC 04/21/2007   BURSITIS, RIGHT HIP 07/31/2008   COPD (chronic obstructive pulmonary disease) ([place]) 04/19/2016   Cough 01/29/2009   Dementia ([place]) 09/01/2010   ERECTILE DYSFUNCTION 03/23/2007   ERECTILE DYSFUNCTION, ORGANIC 04/17/2009   Esophageal stricture    FREQUENCY, URINARY 04/26/2010   GERD 03/23/2007   HIATAL HERNIA    HYPERLIPIDEMIA 03/23/2007   HYPERTENSION 03/23/2007   MILD COGNITIVE IMPAIRMENT SO STATED 05/19/2010   NEPHROLITHIASIS, HX OF 03/23/2007   PSA, INCREASED 06/27/2008   RASH-NONVESICULAR 03/23/2007   REACTIVE AIRWAY  DISEASE 01/14/2010   SCHATZKI'S RING    SCIATICA, RIGHT 04/28/2008   Unspecified hypothyroidism 09/06/2013   Wheezing 04/17/2009   Past Surgical History:  Procedure Laterality Date   ERCP N/A 12/28/2018   Procedure: ENDOSCOPIC RETROGRADE CHOLANGIOPANCREATOGRAPHY (ERCP);  Surgeon: Ladene Artist, MD;  Location: [facility];  Service: Endoscopy;  Laterality: N/A;   REMOVAL OF STONES  12/28/2018   Procedure: REMOVAL OF STONES;  Surgeon: Ladene Artist, MD;  Location: [facility];  Service: Endoscopy;;  balloon sweep   SPHINCTEROTOMY  12/28/2018   Procedure: Joan Mayans;  Surgeon: Ladene Artist, MD;  Location: [facility];  Service: Endoscopy;;   TONSILLECTOMY      reports that he has never smoked. He has never used smokeless tobacco. He reports that he does not drink alcohol and does not use drugs. family history includes Asthma in his sister; Cancer in an other family member; Emphysema in his brother; Hypertension in an other family member; Lung cancer in his brother. Allergies  Allergen Reactions   Atorvastatin     REACTION: myalgia   Doxazosin Mesylate     REACTION: dizziness   Hydrocodone Bit-Homatrop Mbr Other (See Comments)    anxiety   Hydrocodone Anxiety   Current Outpatient Medications on File Prior to Visit  Medication Sig Dispense Refill   albuterol (PROVENTIL) (2.5 MG/3ML) 0.083% nebulizer solution Take 3 mLs (2.5 mg total) by  nebulization every 6 (six) hours as needed for wheezing or shortness of breath. 150 mL 2   albuterol (VENTOLIN HFA) 108 (90 Base) MCG/ACT inhaler Inhale 2 puffs into the lungs every 6 (six) hours as needed for wheezing or shortness of breath. 8 g 11   ALPRAZolam (XANAX) 0.5 MG tablet 1/2 - 1 tab by mouth twice per day as needed 60 tablet 2   aspirin EC 81 MG tablet Take by mouth.     budesonide-formoterol (SYMBICORT) 80-4.5 MCG/ACT inhaler      cetirizine (ZYRTEC) 10 MG tablet Take 1 tablet (10 mg total) by mouth daily. 30 tablet 11    Cholecalciferol (VITAMIN D) 1000 UNITS capsule Take 1,000 Units by mouth every other day.      Cholecalciferol 50 MCG (2000 UT) TABS 1 tab by mouth once daily 30 tablet 99   cyanocobalamin 50 MCG tablet TAKE 100MCG BY MOUTH EVERY 7 DAYS     desoximetasone (TOPICORT) 0.25 % cream APPLY SPARINGLY TO THE AFFECTED AREA TWICE DAILY AS NEEDED AS DIRECTED (Patient taking differently: Apply 1 application  topically 2 (two) times daily. Itching) 30 g 1   fluticasone (FLOVENT HFA) 110 MCG/ACT inhaler INHALE 1 PUFF BY MOUTH TWICE A DAY     Fluticasone Propionate, Inhal, 250 MCG/ACT AEPB Inhale 1 puff into the lungs 2 (two) times daily. 3 each 3   lansoprazole (PREVACID) 30 MG capsule TAKE 1 CAPSULE BY MOUTH ONCE DAILY AT  12  NOON 90 capsule 2   levothyroxine (SYNTHROID) 112 MCG tablet Take 1 tablet by mouth once daily 90 tablet 3   lidocaine (LIDODERM) 5 % Place 1 patch onto the skin daily. Remove & Discard patch within 12 hours or as directed by MD 30 patch 0   LYCOPENE PO Take by mouth.     methylPREDNISolone (MEDROL DOSEPAK) 4 MG TBPK tablet Take 24 mg on day 1, 20 mg on day 2, 16 mg on day 3, 12 mg on day 4, 8 mg on day 5, 4 mg on day 6.  Take all tablets in each row at once, do not spread tablets out throughout the day. 21 tablet 0   Multiple Vitamin (MULTIVITAMINS PO) Take 1 tablet by mouth daily.     Multiple Vitamins-Minerals (CENTRUM SILVER PO) Take 1 tablet by mouth daily.      polyethylene glycol powder (MIRALAX) powder Take 1/2 capful by mouth once daily (Patient taking differently: Take 17 g by mouth 2 (two) times a week.) 850 g 5   predniSONE (DELTASONE) 10 MG tablet 3 tabs by mouth per day for 3 days,2tabs per day for 3 days,1tab per day for 3 days 18 tablet 0   rosuvastatin (CRESTOR) 10 MG tablet Take 1 tablet by mouth once daily 90 tablet 0   RSV vaccine recomb adjuvanted (AREXVY) 120 MCG/0.5ML injection Inject into the muscle. 0.5 mL 0   sodium chloride (MURO 128) 5 % ophthalmic solution  INSTILL 1 DROP IN BOTH EYES FOUR TIMES A DAY FOR CORNEAL EDEMA     solifenacin (VESICARE) 5 MG tablet Take 1 tablet (5 mg total) by mouth daily. 90 tablet 3   tamsulosin (FLOMAX) 0.4 MG CAPS capsule TAKE 1 CAPSULE(0.4 MG) BY MOUTH AT BEDTIME 90 capsule 3   vitamin B-12 (CYANOCOBALAMIN) 100 MCG tablet Take 100 mcg by mouth once a week.     vitamin C (ASCORBIC ACID) 500 MG tablet Take 1,000 mg by mouth daily.      No current facility-administered medications  on file prior to visit.        ROS:  All others reviewed and negative.  Objective        PE:  BP 136/84   Pulse (!) 103   Temp 97.8 F (36.6 C) (Oral)   Ht 6' (1.829 m)   Wt 181 lb 6.4 oz (82.3 kg)   SpO2 90%   BMI 24.60 kg/m   Ambulatory o2 sat - 86% on RA  at 100 ft                Constitutional: Pt appears in NAD, non toxic               HENT: Head: NCAT.                Right Ear: External ear normal.                 Left Ear: External ear normal.                Eyes: . Pupils are equal, round, and reactive to light. Conjunctivae and EOM are normal               Nose: without d/c or deformity               Neck: Neck supple. Gross normal ROM               Cardiovascular: Normal rate and regular rhythm.                 Pulmonary/Chest: Effort normal and breath sounds decreased without rales but loud bilat wheezing.                Abd:  Soft, NT, ND, + BS, no organomegaly               Neurological: Pt is alert. At baseline orientation, motor grossly intact               Skin: Skin is warm. No rashes, no other new lesions, LE edema - none               Psychiatric: Pt behavior is normal without agitation   Micro: none  Cardiac tracings I have personally interpreted today:  none  Pertinent Radiological findings (summarize): none   Lab Results  Component Value Date   WBC 8.7 04/25/2022   HGB 12.8 (L) 04/25/2022   HCT 38.5 (L) 04/25/2022   PLT 255.0 04/25/2022   GLUCOSE 101 (H) 04/25/2022   CHOL 149 04/25/2022    TRIG 207.0 (H) 04/25/2022   HDL 52.00 04/25/2022   LDLDIRECT 74.0 04/25/2022   LDLCALC 63 10/21/2021   ALT 10 04/25/2022   AST 18 04/25/2022   NA 143 04/25/2022   K 5.0 04/25/2022   CL 105 04/25/2022   CREATININE 1.91 (H) 04/25/2022   BUN 37 (H) 04/25/2022   CO2 32 04/25/2022   TSH 2.55 04/25/2022   PSA 3.94 03/12/2014   HGBA1C 6.3 04/25/2022   Assessment/Plan:  BERYL TAVIZON is a 89 y.o. White or Caucasian [1] male with  has a past medical history of ABNORMAL ELECTROCARDIOGRAM (06/21/2007), ALLERGIC RHINITIS (03/23/2007), Anxiety state, unspecified (09/06/2013), ASTHMATIC BRONCHITIS, ACUTE (04/27/2007), BENIGN PROSTATIC HYPERTROPHY (03/23/2007), BRONCHITIS NOT SPECIFIED AS ACUTE OR CHRONIC (04/21/2007), BURSITIS, RIGHT HIP (07/31/2008), COPD (chronic obstructive pulmonary disease) ([place]) (04/19/2016), Cough (01/29/2009), Dementia ([place]) (09/01/2010), ERECTILE DYSFUNCTION (03/23/2007), ERECTILE DYSFUNCTION, ORGANIC (04/17/2009), Esophageal stricture, FREQUENCY, URINARY (04/26/2010), GERD (03/23/2007), HIATAL HERNIA, HYPERLIPIDEMIA (  03/23/2007), HYPERTENSION (03/23/2007), MILD COGNITIVE IMPAIRMENT SO STATED (05/19/2010), NEPHROLITHIASIS, HX OF (03/23/2007), PSA, INCREASED (06/27/2008), RASH-NONVESICULAR (03/23/2007), REACTIVE AIRWAY DISEASE (01/14/2010), SCHATZKI'S RING, SCIATICA, RIGHT (04/28/2008), Unspecified hypothyroidism (09/06/2013), and Wheezing (04/17/2009).  Severe persistent asthma with exacerbation D/w pt and wife - with low o2 sat this episode is worse than prior managed with depomedrol, prednisone and inhaler at home; today for office Albuterol Neb x 1 if able, and depomedrol IM 80 mg, then wife to transport to [facility] ED for further evaluation  Acute hypoxemic respiratory failure ([place]) Mild but significant with amb o2 sat to 86% on RA - for referral to [facility] ED for further eval  Hyperglycemia Lab Results  Component Value Date   HGBA1C 6.3 04/25/2022   Stable, pt to continue current medical treatment   - diet, wt control  Followup: Return if symptoms worsen or fail to improve.  Cathlean Cower, MD 06/28/2022 10:49 AM [facility]

## 2022-06-28 NOTE — Addendum Note (Signed)
Addended by: Max Sane on: 06/28/2022 10:55 AM   Modules accepted: Orders

## 2022-06-28 NOTE — ED Provider Notes (Signed)
[place] EMERGENCY DEPARTMENT AT [facility] Provider Note   CSN: [id number] Arrival date & time: 06/28/22  1208     History  Chief Complaint  Patient presents with   Shortness of Stephen Wilkins is a 89 y.o. male.  He has a history of asthma COPD.  He has had increased shortness of breath going on over a month.  He had a difficult night last night and was unable to sleep much.  Nonproductive cough.  No fevers.  No hemoptysis.  He went to his primary care doctor today who found his saturations to be low at 86%.  He was given a steroid shot and breathing treatment and sent over here for further evaluation.  He denies any leg swelling.  The history is provided by the patient and the spouse.  Shortness of Breath Severity:  Moderate Onset quality:  Gradual Duration:  1 month Timing:  Intermittent Progression:  Unchanged Chronicity:  Recurrent Relieved by:  Nothing Worsened by:  Activity and coughing Ineffective treatments:  Inhaler Associated symptoms: cough and wheezing   Associated symptoms: no abdominal pain, no chest pain, no fever, no hemoptysis, no sputum production and no vomiting   Risk factors: no tobacco use        Home Medications Prior to Admission medications   Medication Sig Start Date End Date Taking? Authorizing Provider  albuterol (PROVENTIL) (2.5 MG/3ML) 0.083% nebulizer solution Take 3 mLs (2.5 mg total) by nebulization every 6 (six) hours as needed for wheezing or shortness of breath. 06/02/22   Biagio Borg, MD  albuterol (VENTOLIN HFA) 108 (90 Base) MCG/ACT inhaler Inhale 2 puffs into the lungs every 6 (six) hours as needed for wheezing or shortness of breath. 07/15/21   Biagio Borg, MD  ALPRAZolam Duanne Moron) 0.5 MG tablet 1/2 - 1 tab by mouth twice per day as needed 09/22/20   Biagio Borg, MD  aspirin EC 81 MG tablet Take by mouth. 02/10/16   [provider]  budesonide-formoterol (SYMBICORT) 80-4.5 MCG/ACT inhaler  05/09/17    [provider]  cetirizine (ZYRTEC) 10 MG tablet Take 1 tablet (10 mg total) by mouth daily. 12/17/21 12/17/22  Biagio Borg, MD  Cholecalciferol (VITAMIN D) 1000 UNITS capsule Take 1,000 Units by mouth every other day.     [provider]  Cholecalciferol 50 MCG (2000 UT) TABS 1 tab by mouth once daily 04/16/21   Biagio Borg, MD  cyanocobalamin 50 MCG tablet TAKE 100MCG BY MOUTH EVERY 7 DAYS 03/02/22   [provider]  desoximetasone (TOPICORT) 0.25 % cream APPLY SPARINGLY TO THE AFFECTED AREA TWICE DAILY AS NEEDED AS DIRECTED Patient taking differently: Apply 1 application  topically 2 (two) times daily. Itching 04/30/18   Biagio Borg, MD  fluticasone (FLOVENT HFA) 110 MCG/ACT inhaler INHALE 1 PUFF BY MOUTH TWICE A DAY 03/02/22   [provider]  Fluticasone Propionate, Inhal, 250 MCG/ACT AEPB Inhale 1 puff into the lungs 2 (two) times daily. 10/29/21 10/24/22  Lynden Oxford Scales, PA-C  lansoprazole (PREVACID) 30 MG capsule TAKE 1 CAPSULE BY MOUTH ONCE DAILY AT  12  NOON 02/22/21   Biagio Borg, MD  levothyroxine (SYNTHROID) 112 MCG tablet Take 1 tablet by mouth once daily 07/07/21   Biagio Borg, MD  lidocaine (LIDODERM) 5 % Place 1 patch onto the skin daily. Remove & Discard patch within 12 hours or as directed by MD 06/09/22   Shirlyn Goltz  Hsienta, MD  losartan (COZAAR) 100 MG tablet Take 0.5 tablets (50 mg total) by mouth daily. 06/28/22   Biagio Borg, MD  LYCOPENE PO Take by mouth. 03/04/19   [provider]  methylPREDNISolone (MEDROL DOSEPAK) 4 MG TBPK tablet Take 24 mg on day 1, 20 mg on day 2, 16 mg on day 3, 12 mg on day 4, 8 mg on day 5, 4 mg on day 6.  Take all tablets in each row at once, do not spread tablets out throughout the day. 10/29/21   Lynden Oxford Scales, PA-C  Multiple Vitamin (MULTIVITAMINS PO) Take 1 tablet by mouth daily. 03/02/22   [provider]  Multiple Vitamins-Minerals (CENTRUM SILVER PO) Take 1 tablet by  mouth daily.     [provider]  polyethylene glycol powder (MIRALAX) powder Take 1/2 capful by mouth once daily Patient taking differently: Take 17 g by mouth 2 (two) times a week. 01/28/15   Biagio Borg, MD  predniSONE (DELTASONE) 10 MG tablet 3 tabs by mouth per day for 3 days,2tabs per day for 3 days,1tab per day for 3 days 06/02/22   Biagio Borg, MD  rosuvastatin (CRESTOR) 10 MG tablet Take 1 tablet by mouth once daily 05/09/22   Biagio Borg, MD  RSV vaccine recomb adjuvanted (AREXVY) 120 MCG/0.5ML injection Inject into the muscle. 03/09/22   Carlyle Basques, MD  sodium chloride (MURO 128) 5 % ophthalmic solution INSTILL 1 DROP IN BOTH EYES FOUR TIMES A DAY FOR CORNEAL EDEMA 05/24/22   [provider]  solifenacin (VESICARE) 5 MG tablet Take 1 tablet (5 mg total) by mouth daily. 04/25/22   Biagio Borg, MD  tamsulosin (FLOMAX) 0.4 MG CAPS capsule TAKE 1 CAPSULE(0.4 MG) BY MOUTH AT BEDTIME 04/25/22   Biagio Borg, MD  vitamin B-12 (CYANOCOBALAMIN) 100 MCG tablet Take 100 mcg by mouth once a week.    [provider]  vitamin C (ASCORBIC ACID) 500 MG tablet Take 1,000 mg by mouth daily.     [provider]      Allergies    Atorvastatin, Doxazosin mesylate, Hydrocodone bit-homatrop mbr, and Hydrocodone    Review of Systems   Review of Systems  Constitutional:  Negative for fever.  Respiratory:  Positive for cough, shortness of breath and wheezing. Negative for hemoptysis and sputum production.   Cardiovascular:  Negative for chest pain.  Gastrointestinal:  Negative for abdominal pain and vomiting.    Physical Exam Updated Vital Signs BP (!) 142/87   Pulse 100   Temp 97.8 F (36.6 C)   Resp 18   SpO2 93%  Physical Exam Vitals and nursing note reviewed.  Constitutional:      General: He is not in acute distress.    Appearance: He is well-developed.  HENT:     Head: Normocephalic and atraumatic.  Eyes:     Conjunctiva/sclera: Conjunctivae  normal.  Cardiovascular:     Rate and Rhythm: Normal rate and regular rhythm.     Heart sounds: No murmur heard. Pulmonary:     Effort: Accessory muscle usage present. No respiratory distress.     Breath sounds: Wheezing present.  Abdominal:     Palpations: Abdomen is soft.     Tenderness: There is no abdominal tenderness.  Musculoskeletal:        General: No swelling.     Cervical back: Neck supple.     Right lower leg: No tenderness. No edema.  Left lower leg: No tenderness. No edema.  Skin:    General: Skin is warm and dry.     Capillary Refill: Capillary refill takes less than 2 seconds.  Neurological:     General: No focal deficit present.     Mental Status: He is alert.     ED Results / Procedures / Treatments   Labs (all labs ordered are listed, but only abnormal results are displayed) Labs Reviewed  BASIC METABOLIC PANEL - Abnormal; Notable for the following components:      Result Value   Glucose, Bld 119 (*)    BUN 38 (*)    Creatinine, Ser 1.83 (*)    GFR, Estimated 34 (*)    All other components within normal limits  CBC WITH DIFFERENTIAL/PLATELET - Abnormal; Notable for the following components:   RBC 3.97 (*)    Hemoglobin 12.5 (*)    Eosinophils Absolute 0.6 (*)    All other components within normal limits  RESP PANEL BY RT-PCR (RSV, FLU A&B, COVID)  RVPGX2  BRAIN NATRIURETIC PEPTIDE    EKG None  Radiology CT Chest Wo Contrast  Result Date: 06/28/2022 CLINICAL DATA:  Shortness of breath, cough. EXAM: CT CHEST WITHOUT CONTRAST TECHNIQUE: Multidetector CT imaging of the chest was performed following the standard protocol without IV contrast. RADIATION DOSE REDUCTION: This exam was performed according to the departmental dose-optimization program which includes automated exposure control, adjustment of the mA and/or kV according to patient size and/or use of iterative reconstruction technique. COMPARISON:  Radiograph of same day. FINDINGS:  Cardiovascular: Atherosclerosis of thoracic aorta is noted. 4 cm ascending thoracic aortic aneurysm is noted. Normal cardiac size. No pericardial effusion. Extensive coronary artery calcifications are noted. Mediastinum/Nodes: No enlarged mediastinal or axillary lymph nodes. Thyroid gland, trachea, and esophagus demonstrate no significant findings. Lungs/Pleura: Left lung is unremarkable. Minimal right posterior basilar subsegmental atelectasis is noted. No pleural effusion or pneumothorax. Upper Abdomen: No acute abnormality. Musculoskeletal: No chest wall mass or suspicious bone lesions identified. IMPRESSION: 4.0 cm ascending thoracic aortic aneurysm. Recommend annual imaging followup by CTA or MRA. This recommendation follows 2010 ACCF/AHA/AATS/ACR/ASA/SCA/SCAI/SIR/STS/SVM Guidelines for the Diagnosis and Management of Patients with Thoracic Aortic Disease. Circulation. 2010; [id number]. Aortic aneurysm NOS (ICD10-I71.9). Minimal right posterior basilar subsegmental atelectasis. Extensive coronary artery calcifications are noted suggesting coronary artery disease. Aortic Atherosclerosis (ICD10-I70.0). Electronically Signed   By: Marijo Conception M.D.   On: 06/28/2022 15:02   DG Chest Port 1 View  Result Date: 06/28/2022 CLINICAL DATA:  Shortness of breath EXAM: PORTABLE CHEST 1 VIEW COMPARISON:  CXR 06/09/22 FINDINGS: No pleural effusion. No pneumothorax. Hazy bibasilar airspace opacity could represent atelectasis or infection. Opacity. Normal cardiac and mediastinal contours. No radiographically apparent displaced rib fractures. Degenerative changes of the bilateral AC joints. Visualized upper abdomen is unremarkable. IMPRESSION: Hazy bibasilar airspace opacity could represent atelectasis or infection. No pleural effusion or pneumothorax. Electronically Signed   By: Marin Roberts M.D.   On: 06/28/2022 12:48    Procedures Procedures    Medications Ordered in ED Medications  magnesium sulfate IVPB 2  g 50 mL (0 g Intravenous Stopped 06/28/22 1346)  ipratropium-albuterol (DUONEB) 0.5-2.5 (3) MG/3ML nebulizer solution 3 mL (3 mLs Nebulization Given 06/28/22 1246)    ED Course/ Medical Decision Making/ A&P Clinical Course as of 06/28/22 1649  Tue Jun 28, 2022  1250 Chest x-ray interpreted by me as no gross infiltrates.  Awaiting radiology reading. [MB]  1508 CT Chest Wo Contrast [MB]  1509 CT does not show any definite evidence of his shortness of breath.  He does have an incidental thoracic aneurysm that we will need to be followed.  Some atelectasis and some coronary calcification.  He said he feels better.  Saturations hanging around 90% on room air.  Asked the nurse if we can do a trending pulse ox to see what happens. [MB]  1520 Patient sats were 92% on room air and increased to 94% with ambulation.  He is comfortable plan for discharge. [MB]    Clinical Course User Index [MB] Hayden Rasmussen, MD                             Medical Decision Making Amount and/or Complexity of Data Reviewed Labs: ordered. Radiology: ordered. Decision-making details documented in ED Course.  Risk Prescription drug management.   This patient complains of short of breath wheezing hypoxia cough; this involves an extensive number of treatment Options and is a complaint that carries with it a high risk of complications and morbidity. The differential includes pneumonia, bronchitis, COPD, asthma, reactive airway disease, COVID, flu, hypoxia, anemia  I ordered, reviewed and interpreted labs, which included CBC with normal white count stable hemoglobin, chemistries with chronic CKD, COVID and flu negative, BNP normal I ordered medication IV magnesium and DuoNeb, had already received steroids at PCP office and reviewed PMP when indicated. I ordered imaging studies which included chest x-ray and CT chest without contrast and I independently    visualized and interpreted imaging which showed nonruptured  thoracic aneurysm, atelectasis, coronary calcification Additional history obtained from patient's wife Previous records obtained and reviewed in epic including PCP note  Cardiac monitoring reviewed, sinus rhythm Social determinants considered, no significant barriers Critical Interventions: None  After the interventions stated above, I reevaluated the patient and found patient's wheezing resolved and no increased work of breathing.  Saturations have remained between 92 and 94% Admission and further testing considered, patient was ambulated in the department with no increased work of breathing.  He would like to go home and I do not see any definite contraindication to that at this time.  Will prescribe him steroids and antibiotics for possible COPD exacerbation.  Return instructions discussed         Final Clinical Impression(s) / ED Diagnoses Final diagnoses:  COPD exacerbation ([place])  Aneurysm of ascending aorta without rupture ([place])    Rx / DC Orders ED Discharge Orders          Ordered    predniSONE (DELTASONE) 20 MG tablet  Daily        06/28/22 1522    doxycycline (VIBRAMYCIN) 100 MG capsule  2 times daily        06/28/22 1522              Hayden Rasmussen, MD 06/28/22 1652

## 2022-06-28 NOTE — Assessment & Plan Note (Signed)
Mild but significant with amb o2 sat to 86% on RA - for referral to [facility] ED for further eval

## 2022-06-28 NOTE — Discharge Instructions (Signed)
You were seen in the emergency department for cough shortness of breath.  Your COVID and flu test were negative and your chest x-ray and CAT scan did not show any definite pneumonia.  You did have a thoracic aneurysm and this will need follow-up.  We are starting you on prednisone and an antibiotic.  Please continue your inhaler treatment.  Follow-up with your primary care doctor.  Return to the emergency department if any worsening or concerning symptoms.

## 2022-06-28 NOTE — ED Notes (Signed)
RN ambulated patient O2 was 92% HR 95 after ambulation O2 94% HR 99 he denies SOB, chest discomfort or pain.

## 2022-06-28 NOTE — Patient Instructions (Signed)
You had the steroid shot today  You are given the Albuterol Neb x 1 today  Please continue all other medications as before, and refills have been done if requested.  Please have the pharmacy call with any other refills you may need.  Please keep your appointments with your specialists as you may have planned  Please go to [facility] ED now for further evaluation and treatment

## 2022-06-28 NOTE — ED Triage Notes (Addendum)
C/o sob, worse with coughing, last night used home neb w/o relief.  Also c/o runny nose, chills, fatigue, cough x3 days Denies cp.  Sent from PCP due to 86% with ambulation.  Albuterol neb and depomedrol PTA.  Hx asthma

## 2022-06-28 NOTE — Assessment & Plan Note (Signed)
D/w pt and wife - with low o2 sat this episode is worse than prior managed with depomedrol, prednisone and inhaler at home; today for office Albuterol Neb x 1 if able, and depomedrol IM 80 mg, then wife to transport to [facility] ED for further evaluation

## 2022-06-30 ENCOUNTER — Telehealth: Payer: Self-pay

## 2022-06-30 NOTE — Telephone Encounter (Signed)
     Patient  visit on 3/26  at [place] you been able to follow up with your primary care physician? No    The patient was or was not able to obtain any needed medicine or equipment. Yes   Are there diet recommendations that you are having difficulty following? na  Patient expresses understanding of discharge instructions and education provided has no other needs at this time.  Yes    [facility] [phone] [address], [place], [place] [address] Phone: [phone] Email: [email or web address]

## 2022-07-19 ENCOUNTER — Other Ambulatory Visit (HOSPITAL_COMMUNITY): Payer: Self-pay | Admitting: Internal Medicine

## 2022-07-19 DIAGNOSIS — I739 Peripheral vascular disease, unspecified: Secondary | ICD-10-CM

## 2022-08-02 ENCOUNTER — Ambulatory Visit (INDEPENDENT_AMBULATORY_CARE_PROVIDER_SITE_OTHER): Payer: PPO | Admitting: Internal Medicine

## 2022-08-02 ENCOUNTER — Encounter: Payer: Self-pay | Admitting: Internal Medicine

## 2022-08-02 ENCOUNTER — Ambulatory Visit (HOSPITAL_COMMUNITY)
Admission: RE | Admit: 2022-08-02 | Discharge: 2022-08-02 | Disposition: A | Discharge status: Home / Self Care | Payer: No Typology Code available for payment source | Source: Ambulatory Visit | Attending: Internal Medicine | Admitting: Internal Medicine

## 2022-08-02 VITALS — BP 124/76 | HR 76 | Temp 98.4°F | Ht 72.0 in | Wt 180.0 lb

## 2022-08-02 DIAGNOSIS — R739 Hyperglycemia, unspecified: Secondary | ICD-10-CM | POA: Diagnosis not present

## 2022-08-02 DIAGNOSIS — I712 Thoracic aortic aneurysm, without rupture, unspecified: Secondary | ICD-10-CM | POA: Insufficient documentation

## 2022-08-02 DIAGNOSIS — I739 Peripheral vascular disease, unspecified: Secondary | ICD-10-CM | POA: Insufficient documentation

## 2022-08-02 DIAGNOSIS — E559 Vitamin D deficiency, unspecified: Secondary | ICD-10-CM | POA: Diagnosis not present

## 2022-08-02 DIAGNOSIS — R0602 Shortness of breath: Secondary | ICD-10-CM | POA: Insufficient documentation

## 2022-08-02 DIAGNOSIS — E890 Postprocedural hypothyroidism: Secondary | ICD-10-CM | POA: Diagnosis not present

## 2022-08-02 DIAGNOSIS — J4531 Mild persistent asthma with (acute) exacerbation: Secondary | ICD-10-CM

## 2022-08-02 DIAGNOSIS — H18523 Epithelial (juvenile) corneal dystrophy, bilateral: Secondary | ICD-10-CM | POA: Insufficient documentation

## 2022-08-02 DIAGNOSIS — J45909 Unspecified asthma, uncomplicated: Secondary | ICD-10-CM | POA: Insufficient documentation

## 2022-08-02 LAB — VAS US ABI WITH/WO TBI
Left ABI: 1.11
Right ABI: 0.67

## 2022-08-02 MED ORDER — METHYLPREDNISOLONE ACETATE 80 MG/ML IJ SUSP
80.0000 mg | Freq: Once | INTRAMUSCULAR | Status: AC
  Administered 2022-08-02: 80 mg via INTRAMUSCULAR

## 2022-08-02 MED ORDER — PREDNISONE 10 MG PO TABS: ORAL_TABLET | ORAL | 0 refills | Status: DC

## 2022-08-02 MED ORDER — DOXYCYCLINE HYCLATE 100 MG PO CAPS: 100.0000 mg | ORAL_CAPSULE | Freq: Two times a day (BID) | ORAL | 0 refills | Status: DC

## 2022-08-02 NOTE — Patient Instructions (Addendum)
You had the steroid shot today  Please take all new medication as prescribed - the antibiotic, and prednisone  Please continue all other medications as before, including the cough medicine and albuterol you have at home  Please have the pharmacy call with any other refills you may need  Please keep your appointments with your specialists as you may have planned  We can hold on xray or lab work today  Please make an Appointment to return in July 22, or sooner if needed

## 2022-08-02 NOTE — Progress Notes (Unsigned)
Patient ID: Stephen Wilkins, male   DOB: [birth year], 89 y.o.   MRN: [id number]        Chief Complaint: follow up prod cough with wheezing sob, hyperglycemia, low vit d, low thyroid       HPI:  Stephen Wilkins is a 89 y.o. male Here with acute onset mild to mod 2-3 days ST, HA, general weakness and malaise, with prod cough greenish sputum with worsening sob, doe wheezing despite neb use at home, but Pt denies chest pain, orthopnea, PND, increased LE swelling, palpitations, dizziness or syncope.   Pt denies polydipsia, polyuria, or new focal neuro s/s.    Pt denies fever, wt loss, night sweats, loss of appetite, or other constitutional symptoms  .       Wt Readings from Last 3 Encounters:  08/02/22 180 lb (81.6 kg)  06/28/22 181 lb 6.4 oz (82.3 kg)  06/09/22 186 lb (84.4 kg)   BP Readings from Last 3 Encounters:  08/02/22 124/76  06/28/22 (!) 142/87  06/28/22 136/84         Past Medical History:  Diagnosis Date   ABNORMAL ELECTROCARDIOGRAM 06/21/2007   ALLERGIC RHINITIS 03/23/2007   Anxiety state, unspecified 09/06/2013   ASTHMATIC BRONCHITIS, ACUTE 04/27/2007   BENIGN PROSTATIC HYPERTROPHY 03/23/2007   BRONCHITIS NOT SPECIFIED AS ACUTE OR CHRONIC 04/21/2007   BURSITIS, RIGHT HIP 07/31/2008   COPD (chronic obstructive pulmonary disease) (HCC) 04/19/2016   Cough 01/29/2009   Dementia (HCC) 09/01/2010   ERECTILE DYSFUNCTION 03/23/2007   ERECTILE DYSFUNCTION, ORGANIC 04/17/2009   Esophageal stricture    FREQUENCY, URINARY 04/26/2010   GERD 03/23/2007   HIATAL HERNIA    HYPERLIPIDEMIA 03/23/2007   HYPERTENSION 03/23/2007   MILD COGNITIVE IMPAIRMENT SO STATED 05/19/2010   NEPHROLITHIASIS, HX OF 03/23/2007   PSA, INCREASED 06/27/2008   RASH-NONVESICULAR 03/23/2007   REACTIVE AIRWAY DISEASE 01/14/2010   SCHATZKI'S RING    SCIATICA, RIGHT 04/28/2008   Unspecified hypothyroidism 09/06/2013   Wheezing 04/17/2009   Past Surgical History:  Procedure Laterality Date   ERCP N/A 12/28/2018   Procedure:  ENDOSCOPIC RETROGRADE CHOLANGIOPANCREATOGRAPHY (ERCP);  Surgeon: Meryl Dare, MD;  Location: [facility];  Service: Endoscopy;  Laterality: N/A;   REMOVAL OF STONES  12/28/2018   Procedure: REMOVAL OF STONES;  Surgeon: Meryl Dare, MD;  Location: [facility];  Service: Endoscopy;;  balloon sweep   SPHINCTEROTOMY  12/28/2018   Procedure: Dennison Mascot;  Surgeon: Meryl Dare, MD;  Location: [facility] ENDOSCOPY;  Service: Endoscopy;;   TONSILLECTOMY      reports that he has never smoked. He has never used smokeless tobacco. He reports that he does not drink alcohol and does not use drugs. family history includes Asthma in his sister; Cancer in an other family member; Emphysema in his brother; Hypertension in an other family member; Lung cancer in his brother. Allergies  Allergen Reactions   Atorvastatin     REACTION: myalgia   Doxazosin Mesylate     REACTION: dizziness   Hydrocodone Bit-Homatrop Mbr Other (See Comments)    anxiety   Hydrocodone Anxiety   Current Outpatient Medications on File Prior to Visit  Medication Sig Dispense Refill   albuterol (PROVENTIL) (2.5 MG/3ML) 0.083% nebulizer solution Take 3 mLs (2.5 mg total) by nebulization every 6 (six) hours as needed for wheezing or shortness of breath. 150 mL 2   albuterol (VENTOLIN HFA) 108 (90 Base) MCG/ACT inhaler Inhale 2 puffs into the lungs every 6 (six) hours as needed for wheezing  or shortness of breath. 8 g 11   ALPRAZolam (XANAX) 0.5 MG tablet 1/2 - 1 tab by mouth twice per day as needed 60 tablet 2   aspirin EC 81 MG tablet Take by mouth.     budesonide-formoterol (SYMBICORT) 80-4.5 MCG/ACT inhaler      cetirizine (ZYRTEC) 10 MG tablet Take 1 tablet (10 mg total) by mouth daily. 30 tablet 11   Cholecalciferol (VITAMIN D) 1000 UNITS capsule Take 1,000 Units by mouth every other day.      Cholecalciferol 50 MCG (2000 UT) TABS 1 tab by mouth once daily 30 tablet 99   cyanocobalamin 50 MCG tablet TAKE BY MOUTH  EVERY 7 DAYS     desoximetasone (TOPICORT) 0.25 % cream APPLY SPARINGLY TO THE AFFECTED AREA TWICE DAILY AS NEEDED AS DIRECTED (Patient taking differently: Apply 1 application  topically 2 (two) times daily. Itching) 30 g 1   fluticasone (FLOVENT HFA) 110 MCG/ACT inhaler INHALE 1 PUFF BY MOUTH TWICE A DAY     Fluticasone Propionate, Inhal, 250 MCG/ACT AEPB Inhale 1 puff into the lungs 2 (two) times daily. 3 each 3   lansoprazole (PREVACID) 30 MG capsule TAKE 1 CAPSULE BY MOUTH ONCE DAILY AT  12  NOON 90 capsule 2   levothyroxine (SYNTHROID) 112 MCG tablet Take 1 tablet by mouth once daily 90 tablet 3   lidocaine (LIDODERM) 5 % Place 1 patch onto the skin daily. Remove & Discard patch within 12 hours or as directed by MD 30 patch 0   losartan (COZAAR) 100 MG tablet Take 0.5 tablets (50 mg total) by mouth daily. 45 tablet 3   LYCOPENE PO Take by mouth.     methylPREDNISolone (MEDROL DOSEPAK) 4 MG TBPK tablet Take 24 mg on day 1, 20 mg on day 2, 16 mg on day 3, 12 mg on day 4, 8 mg on day 5, 4 mg on day 6.  Take all tablets in each row at once, do not spread tablets out throughout the day. 21 tablet 0   Multiple Vitamin (MULTIVITAMINS PO) Take 1 tablet by mouth daily.     Multiple Vitamins-Minerals (CENTRUM SILVER PO) Take 1 tablet by mouth daily.      polyethylene glycol powder (MIRALAX) powder Take 1/2 capful by mouth once daily (Patient taking differently: Take 17 g by mouth 2 (two) times a week.) 850 g 5   predniSONE (DELTASONE) 20 MG tablet Take 3 tablets (60 mg total) by mouth daily. 12 tablet 0   rosuvastatin (CRESTOR) 10 MG tablet Take 1 tablet by mouth once daily 90 tablet 0   RSV vaccine recomb adjuvanted (AREXVY) 120 MCG/0.5ML injection Inject into the muscle. 0.5 mL 0   sodium chloride (MURO 128) 5 % ophthalmic solution INSTILL 1 DROP IN BOTH EYES FOUR TIMES A DAY FOR CORNEAL EDEMA     solifenacin (VESICARE) 5 MG tablet Take 1 tablet (5 mg total) by mouth daily. 90 tablet 3   tamsulosin  (FLOMAX) 0.4 MG CAPS capsule TAKE 1 CAPSULE(0.4 MG) BY MOUTH AT BEDTIME 90 capsule 3   vitamin B-12 (CYANOCOBALAMIN) 100 MCG tablet Take 100 mcg by mouth once a week.     vitamin C (ASCORBIC ACID) 500 MG tablet Take 1,000 mg by mouth daily.      Current Facility-Administered Medications on File Prior to Visit  Medication Dose Route Frequency Provider Last Rate Last Admin   albuterol (PROVENTIL) (2.5 MG/3ML) 0.083% nebulizer solution 2.5 mg  2.5 mg Nebulization Once Stephen Wilkins,  Len Blalock, MD            ROS:  All others reviewed and negative.  Objective        PE:  BP 124/76 (BP Location: Right Arm, Patient Position: Sitting, Cuff Size: Normal)   Pulse 76   Temp 98.4 F (36.9 C) (Oral)   Ht 6' (1.829 m)   Wt 180 lb (81.6 kg)   SpO2 94%   BMI 24.41 kg/m                 Constitutional: Pt appears in NAD, mild ill               HENT: Head: NCAT.                Right Ear: External ear normal.                 Left Ear: External ear normal. Bilat tm's with mild erythema.  Max sinus areas none tender.  Pharynx with mild erythema, no exudate               Eyes: . Pupils are equal, round, and reactive to light. Conjunctivae and EOM are normal               Nose: without d/c or deformity               Neck: Neck supple. Gross normal ROM               Cardiovascular: Normal rate and regular rhythm.                 Pulmonary/Chest: Effort normal and breath sounds without rales or wheezing.                Abd:  Soft, NT, ND, + BS, no organomegaly               Neurological: Pt is alert. At baseline orientation, motor grossly intact               Skin: Skin is warm. No rashes, no other new lesions, LE edema - none               Psychiatric: Pt behavior is normal without agitation   Micro: none  Cardiac tracings I have personally interpreted today:  none  Pertinent Radiological findings (summarize): none   Lab Results  Component Value Date   WBC 7.7 06/28/2022   HGB 12.5 (L) 06/28/2022   HCT  39.4 06/28/2022   PLT 202 06/28/2022   GLUCOSE 119 (H) 06/28/2022   CHOL 149 04/25/2022   TRIG 207.0 (H) 04/25/2022   HDL 52.00 04/25/2022   LDLDIRECT 74.0 04/25/2022   LDLCALC 63 10/21/2021   ALT 10 04/25/2022   AST 18 04/25/2022   NA 140 06/28/2022   K 4.6 06/28/2022   CL 102 06/28/2022   CREATININE 1.83 (H) 06/28/2022   BUN 38 (H) 06/28/2022   CO2 29 06/28/2022   TSH 2.55 04/25/2022   PSA 3.94 03/12/2014   HGBA1C 6.3 04/25/2022   Assessment/Plan:  Stephen Wilkins is a 73 y.o. White or Caucasian [1] male with  has a past medical history of ABNORMAL ELECTROCARDIOGRAM (06/21/2007), ALLERGIC RHINITIS (03/23/2007), Anxiety state, unspecified (09/06/2013), ASTHMATIC BRONCHITIS, ACUTE (04/27/2007), BENIGN PROSTATIC HYPERTROPHY (03/23/2007), BRONCHITIS NOT SPECIFIED AS ACUTE OR CHRONIC (04/21/2007), BURSITIS, RIGHT HIP (07/31/2008), COPD (chronic obstructive pulmonary disease) (HCC) (04/19/2016), Cough (01/29/2009), Dementia (HCC) (09/01/2010), ERECTILE DYSFUNCTION (03/23/2007), ERECTILE DYSFUNCTION, ORGANIC (04/17/2009),  Esophageal stricture, FREQUENCY, URINARY (04/26/2010), GERD (03/23/2007), HIATAL HERNIA, HYPERLIPIDEMIA (03/23/2007), HYPERTENSION (03/23/2007), MILD COGNITIVE IMPAIRMENT SO STATED (05/19/2010), NEPHROLITHIASIS, HX OF (03/23/2007), PSA, INCREASED (06/27/2008), RASH-NONVESICULAR (03/23/2007), REACTIVE AIRWAY DISEASE (01/14/2010), SCHATZKI'S RING, SCIATICA, RIGHT (04/28/2008), Unspecified hypothyroidism (09/06/2013), and Wheezing (04/17/2009).  Asthmatic bronchitis Mild to mod, for antibx course doxycycine 100 bid, predsnisone taper, depomedrol IM 80 mg, cont albuterol nebs to f/u any worsening symptoms or concerns  Hyperglycemia Lab Results  Component Value Date   HGBA1C 6.3 04/25/2022   Stable, pt to continue current medical treatment  - diet, wt control   Hypothyroidism Lab Results  Component Value Date   TSH 2.55 04/25/2022   Stable, pt to continue levothyroxine 112 mcg  qd   Vitamin D deficiency Last vitamin D Lab Results  Component Value Date   VD25OH 36.66 04/25/2022   Low, to start oral replacement  Followup: Return in about 3 months (around 10/24/2022).  Oliver Barre, MD 08/04/2022 4:16 PM [facility]

## 2022-08-03 ENCOUNTER — Other Ambulatory Visit: Payer: Self-pay | Admitting: Internal Medicine

## 2022-08-04 ENCOUNTER — Encounter: Payer: Self-pay | Admitting: Internal Medicine

## 2022-08-04 NOTE — Assessment & Plan Note (Signed)
Lab Results  Component Value Date   HGBA1C 6.3 04/25/2022   Stable, pt to continue current medical treatment  - diet, wt control  

## 2022-08-04 NOTE — Assessment & Plan Note (Signed)
Mild to mod, for antibx course doxycycine 100 bid, predsnisone taper, depomedrol IM 80 mg, cont albuterol nebs to f/u any worsening symptoms or concerns

## 2022-08-04 NOTE — Assessment & Plan Note (Signed)
Last vitamin D Lab Results  Component Value Date   VD25OH 36.66 04/25/2022   Low, to start oral replacement

## 2022-08-04 NOTE — Assessment & Plan Note (Signed)
Lab Results  Component Value Date   TSH 2.55 04/25/2022   Stable, pt to continue levothyroxine 112 mcg qd

## 2022-08-08 ENCOUNTER — Other Ambulatory Visit: Payer: Self-pay | Admitting: Internal Medicine

## 2022-08-11 ENCOUNTER — Other Ambulatory Visit: Payer: Self-pay

## 2022-08-12 ENCOUNTER — Other Ambulatory Visit: Payer: Self-pay

## 2022-08-30 LAB — LAB REPORT - SCANNED
A1c: 6.4
Albumin, Urine POC: 28.3
Creatinine, POC: 91.7 mg/dL
EGFR: 36
Microalb Creat Ratio: 308.5

## 2022-09-22 ENCOUNTER — Ambulatory Visit (INDEPENDENT_AMBULATORY_CARE_PROVIDER_SITE_OTHER): Payer: PPO

## 2022-09-22 VITALS — Ht 72.0 in | Wt 180.0 lb

## 2022-09-22 DIAGNOSIS — Z0000 Encounter for general adult medical examination without abnormal findings: Secondary | ICD-10-CM

## 2022-09-22 NOTE — Patient Instructions (Addendum)
Stephen Wilkins , Thank you for taking time to come for your Medicare Wellness Visit. I appreciate your ongoing commitment to your health goals. Please review the following plan we discussed and let me know if I can assist you in the future.   These are the goals we discussed:  Goals       No current goals (pt-stated)      Patient Stated      Stay as active and as independent as possible. Continue to go to the gym several times per week.        This is a list of the screening recommended for you and due dates:  Health Maintenance  Topic Date Due   COVID-19 Vaccine (5 - 2023-24 season) 10/08/2022*   Zoster (Shingles) Vaccine (1 of 2) 11/01/2022*   Flu Shot  11/03/2022   Medicare Annual Wellness Visit  09/22/2023   DTaP/Tdap/Td vaccine (3 - Td or Tdap) 01/08/2029   Pneumonia Vaccine  Completed   HPV Vaccine  Aged Out  *Topic was postponed. The date shown is not the original due date.    Advanced directives: Please bring a copy of your health care power of attorney and living will to the office to be added to your chart at your convenience.   Conditions/risks identified: None  Next appointment: Follow up in one year for your annual wellness visit.   Preventive Care 25 Years and Older, Male  Preventive care refers to lifestyle choices and visits with your health care provider that can promote health and wellness. What does preventive care include? A yearly physical exam. This is also called an annual well check. Dental exams once or twice a year. Routine eye exams. Ask your health care provider how often you should have your eyes checked. Personal lifestyle choices, including: Daily care of your teeth and gums. Regular physical activity. Eating a healthy diet. Avoiding tobacco and drug use. Limiting alcohol use. Practicing safe sex. Taking low doses of aspirin every day. Taking vitamin and mineral supplements as recommended by your health care provider. What happens during an  annual well check? The services and screenings done by your health care provider during your annual well check will depend on your age, overall health, lifestyle risk factors, and family history of disease. Counseling  Your health care provider may ask you questions about your: Alcohol use. Tobacco use. Drug use. Emotional well-being. Home and relationship well-being. Sexual activity. Eating habits. History of falls. Memory and ability to understand (cognition). Work and work Astronomer. Screening  You may have the following tests or measurements: Height, weight, and BMI. Blood pressure. Lipid and cholesterol levels. These may be checked every 5 years, or more frequently if you are over 110 years old. Skin check. Lung cancer screening. You may have this screening every year starting at age 61 if you have a 30-pack-year history of smoking and currently smoke or have quit within the past 15 years. Fecal occult blood test (FOBT) of the stool. You may have this test every year starting at age 76. Flexible sigmoidoscopy or colonoscopy. You may have a sigmoidoscopy every 5 years or a colonoscopy every 10 years starting at age 53. Prostate cancer screening. Recommendations will vary depending on your family history and other risks. Hepatitis C blood test. Hepatitis B blood test. Sexually transmitted disease (STD) testing. Diabetes screening. This is done by checking your blood sugar (glucose) after you have not eaten for a while (fasting). You may have this done every 1-3  years. Abdominal aortic aneurysm (AAA) screening. You may need this if you are a current or former smoker. Osteoporosis. You may be screened starting at age 11 if you are at high risk. Talk with your health care provider about your test results, treatment options, and if necessary, the need for more tests. Vaccines  Your health care provider may recommend certain vaccines, such as: Influenza vaccine. This is recommended  every year. Tetanus, diphtheria, and acellular pertussis (Tdap, Td) vaccine. You may need a Td booster every 10 years. Zoster vaccine. You may need this after age 1. Pneumococcal 13-valent conjugate (PCV13) vaccine. One dose is recommended after age 80. Pneumococcal polysaccharide (PPSV23) vaccine. One dose is recommended after age 67. Talk to your health care provider about which screenings and vaccines you need and how often you need them. This information is not intended to replace advice given to you by your health care provider. Make sure you discuss any questions you have with your health care provider. Document Released: 04/17/2015 Document Revised: 12/09/2015 Document Reviewed: 01/20/2015 Elsevier Interactive Patient Education  2017 [employer] Prevention in the Home Falls can cause injuries. They can happen to people of all ages. There are many things you can do to make your home safe and to help prevent falls. What can I do on the outside of my home? Regularly fix the edges of walkways and driveways and fix any cracks. Remove anything that might make you trip as you walk through a door, such as a raised step or threshold. Trim any bushes or trees on the path to your home. Use bright outdoor lighting. Clear any walking paths of anything that might make someone trip, such as rocks or tools. Regularly check to see if handrails are loose or broken. Make sure that both sides of any steps have handrails. Any raised decks and porches should have guardrails on the edges. Have any leaves, snow, or ice cleared regularly. Use sand or salt on walking paths during winter. Clean up any spills in your garage right away. This includes oil or grease spills. What can I do in the bathroom? Use night lights. Install grab bars by the toilet and in the tub and shower. Do not use towel bars as grab bars. Use non-skid mats or decals in the tub or shower. If you need to sit down in the shower,  use a plastic, non-slip stool. Keep the floor dry. Clean up any water that spills on the floor as soon as it happens. Remove soap buildup in the tub or shower regularly. Attach bath mats securely with double-sided non-slip rug tape. Do not have throw rugs and other things on the floor that can make you trip. What can I do in the bedroom? Use night lights. Make sure that you have a light by your bed that is easy to reach. Do not use any sheets or blankets that are too big for your bed. They should not hang down onto the floor. Have a firm chair that has side arms. You can use this for support while you get dressed. Do not have throw rugs and other things on the floor that can make you trip. What can I do in the kitchen? Clean up any spills right away. Avoid walking on wet floors. Keep items that you use a lot in easy-to-reach places. If you need to reach something above you, use a strong step stool that has a grab bar. Keep electrical cords out of the way. Do  not use floor polish or wax that makes floors slippery. If you must use wax, use non-skid floor wax. Do not have throw rugs and other things on the floor that can make you trip. What can I do with my stairs? Do not leave any items on the stairs. Make sure that there are handrails on both sides of the stairs and use them. Fix handrails that are broken or loose. Make sure that handrails are as long as the stairways. Check any carpeting to make sure that it is firmly attached to the stairs. Fix any carpet that is loose or worn. Avoid having throw rugs at the top or bottom of the stairs. If you do have throw rugs, attach them to the floor with carpet tape. Make sure that you have a light switch at the top of the stairs and the bottom of the stairs. If you do not have them, ask someone to add them for you. What else can I do to help prevent falls? Wear shoes that: Do not have high heels. Have rubber bottoms. Are comfortable and fit you  well. Are closed at the toe. Do not wear sandals. If you use a stepladder: Make sure that it is fully opened. Do not climb a closed stepladder. Make sure that both sides of the stepladder are locked into place. Ask someone to hold it for you, if possible. Clearly mark and make sure that you can see: Any grab bars or handrails. First and last steps. Where the edge of each step is. Use tools that help you move around (mobility aids) if they are needed. These include: Canes. Walkers. Scooters. Crutches. Turn on the lights when you go into a dark area. Replace any light bulbs as soon as they burn out. Set up your furniture so you have a clear path. Avoid moving your furniture around. If any of your floors are uneven, fix them. If there are any pets around you, be aware of where they are. Review your medicines with your doctor. Some medicines can make you feel dizzy. This can increase your chance of falling. Ask your doctor what other things that you can do to help prevent falls. This information is not intended to replace advice given to you by your health care provider. Make sure you discuss any questions you have with your health care provider. Document Released: 01/15/2009 Document Revised: 08/27/2015 Document Reviewed: 04/25/2014 Elsevier Interactive Patient Education  2017 [employer].

## 2022-09-22 NOTE — Progress Notes (Signed)
Subjective:   Stephen Wilkins is a 89 y.o. male who presents for Medicare Annual/Subsequent preventive examination.  Visit Complete: Virtual  I connected with  Melina Copa on 09/22/22 by a audio enabled telemedicine application and verified that I am speaking with the correct person using two identifiers.  Patient Location: Home  Provider Location: Home Office  I discussed the limitations of evaluation and management by telemedicine. The patient expressed understanding and agreed to proceed.  Patient Medicare AWV questionnaire was completed by the patient on ; I have confirmed that all information answered by patient is correct and no changes since this date.  Review of Systems     Cardiac Risk Factors include: advanced age (>15men, >61 women);male gender;hypertension     Objective:    Today's Vitals   09/22/22 1412  Weight: 180 lb (81.6 kg)  Height: 6' (1.829 m)   Body mass index is 24.41 kg/m.     09/22/2022    2:21 PM 06/09/2022    1:42 PM 03/28/2021   11:45 AM 11/07/2019    9:37 AM 12/28/2018   11:45 AM 12/28/2018    1:38 AM 12/28/2018    1:15 AM  Advanced Directives  Does Patient Have a Medical Advance Directive? Yes No Yes Yes Yes Yes Yes  Type of Estate agent of [place];Living will   Healthcare Power of [place];Living will Living will Living will Living will  Does patient want to make changes to medical advance directive?    No - Patient declined  No - Patient declined   Copy of Healthcare Power of Attorney in Chart? No - copy requested   No - copy requested     Would patient like information on creating a medical advance directive?  No - Patient declined         Current Medications (verified) Outpatient Encounter Medications as of 09/22/2022  Medication Sig   rosuvastatin (CRESTOR) 10 MG tablet Take 1 tablet by mouth once daily   albuterol (PROVENTIL) (2.5 MG/3ML) 0.083% nebulizer solution Take 3 mLs (2.5 mg total) by nebulization every 6  (six) hours as needed for wheezing or shortness of breath.   albuterol (VENTOLIN HFA) 108 (90 Base) MCG/ACT inhaler INHALE 2 PUFFS BY MOUTH EVERY 6 HOURS AS NEEDED FOR WHEEZING FOR SHORTNESS OF BREATH   ALPRAZolam (XANAX) 0.5 MG tablet 1/2 - 1 tab by mouth twice per day as needed   aspirin EC 81 MG tablet Take by mouth.   budesonide-formoterol (SYMBICORT) 80-4.5 MCG/ACT inhaler    cetirizine (ZYRTEC) 10 MG tablet Take 1 tablet (10 mg total) by mouth daily.   Cholecalciferol (VITAMIN D) 1000 UNITS capsule Take 1,000 Units by mouth every other day.    Cholecalciferol 50 MCG (2000 UT) TABS 1 tab by mouth once daily   cyanocobalamin 50 MCG tablet TAKE BY MOUTH EVERY 7 DAYS   desoximetasone (TOPICORT) 0.25 % cream APPLY SPARINGLY TO THE AFFECTED AREA TWICE DAILY AS NEEDED AS DIRECTED (Patient taking differently: Apply 1 application  topically 2 (two) times daily. Itching)   doxycycline (VIBRAMYCIN) 100 MG capsule Take 1 capsule (100 mg total) by mouth 2 (two) times daily.   fluticasone (FLOVENT HFA) 110 MCG/ACT inhaler INHALE 1 PUFF BY MOUTH TWICE A DAY   Fluticasone Propionate, Inhal, 250 MCG/ACT AEPB Inhale 1 puff into the lungs 2 (two) times daily.   lansoprazole (PREVACID) 30 MG capsule TAKE 1 CAPSULE BY MOUTH ONCE DAILY AT  12  NOON   levothyroxine (  SYNTHROID) 112 MCG tablet Take 1 tablet by mouth once daily   lidocaine (LIDODERM) 5 % Place 1 patch onto the skin daily. Remove & Discard patch within 12 hours or as directed by MD   losartan (COZAAR) 100 MG tablet Take 0.5 tablets (50 mg total) by mouth daily.   LYCOPENE PO Take by mouth.   methylPREDNISolone (MEDROL DOSEPAK) 4 MG TBPK tablet Take 24 mg on day 1, 20 mg on day 2, 16 mg on day 3, 12 mg on day 4, 8 mg on day 5, 4 mg on day 6.  Take all tablets in each row at once, do not spread tablets out throughout the day.   Multiple Vitamin (MULTIVITAMINS PO) Take 1 tablet by mouth daily.   Multiple Vitamins-Minerals (CENTRUM SILVER PO) Take  1 tablet by mouth daily.    polyethylene glycol powder (MIRALAX) powder Take 1/2 capful by mouth once daily (Patient taking differently: Take 17 g by mouth 2 (two) times a week.)   predniSONE (DELTASONE) 10 MG tablet 3 tabs by mouth per day for 3 days,2tabs per day for 3 days,1tab per day for 3 days   predniSONE (DELTASONE) 20 MG tablet Take 3 tablets (60 mg total) by mouth daily.   RSV vaccine recomb adjuvanted (AREXVY) 120 MCG/0.5ML injection Inject into the muscle.   sodium chloride (MURO 128) 5 % ophthalmic solution INSTILL 1 DROP IN BOTH EYES FOUR TIMES A DAY FOR CORNEAL EDEMA   solifenacin (VESICARE) 5 MG tablet Take 1 tablet (5 mg total) by mouth daily.   tamsulosin (FLOMAX) 0.4 MG CAPS capsule TAKE 1 CAPSULE(0.4 MG) BY MOUTH AT BEDTIME   vitamin B-12 (CYANOCOBALAMIN) 100 MCG tablet Take 100 mcg by mouth once a week.   vitamin C (ASCORBIC ACID) 500 MG tablet Take 1,000 mg by mouth daily.    Facility-Administered Encounter Medications as of 09/22/2022  Medication   albuterol (PROVENTIL) (2.5 MG/3ML) 0.083% nebulizer solution 2.5 mg    Allergies (verified) Atorvastatin, Doxazosin mesylate, Hydrocodone bit-homatrop mbr, and Hydrocodone   History: Past Medical History:  Diagnosis Date   ABNORMAL ELECTROCARDIOGRAM 06/21/2007   ALLERGIC RHINITIS 03/23/2007   Anxiety state, unspecified 09/06/2013   ASTHMATIC BRONCHITIS, ACUTE 04/27/2007   BENIGN PROSTATIC HYPERTROPHY 03/23/2007   BRONCHITIS NOT SPECIFIED AS ACUTE OR CHRONIC 04/21/2007   BURSITIS, RIGHT HIP 07/31/2008   COPD (chronic obstructive pulmonary disease) (HCC) 04/19/2016   Cough 01/29/2009   Dementia (HCC) 09/01/2010   ERECTILE DYSFUNCTION 03/23/2007   ERECTILE DYSFUNCTION, ORGANIC 04/17/2009   Esophageal stricture    FREQUENCY, URINARY 04/26/2010   GERD 03/23/2007   HIATAL HERNIA    HYPERLIPIDEMIA 03/23/2007   HYPERTENSION 03/23/2007   MILD COGNITIVE IMPAIRMENT SO STATED 05/19/2010   NEPHROLITHIASIS, HX OF 03/23/2007   PSA,  INCREASED 06/27/2008   RASH-NONVESICULAR 03/23/2007   REACTIVE AIRWAY DISEASE 01/14/2010   SCHATZKI'S RING    SCIATICA, RIGHT 04/28/2008   Unspecified hypothyroidism 09/06/2013   Wheezing 04/17/2009   Past Surgical History:  Procedure Laterality Date   ERCP N/A 12/28/2018   Procedure: ENDOSCOPIC RETROGRADE CHOLANGIOPANCREATOGRAPHY (ERCP);  Surgeon: Meryl Dare, MD;  Location: [facility];  Service: Endoscopy;  Laterality: N/A;   REMOVAL OF STONES  12/28/2018   Procedure: REMOVAL OF STONES;  Surgeon: Meryl Dare, MD;  Location: [facility];  Service: Endoscopy;;  balloon sweep   SPHINCTEROTOMY  12/28/2018   Procedure: Dennison Mascot;  Surgeon: Meryl Dare, MD;  Location: [facility];  Service: Endoscopy;;   TONSILLECTOMY  Family History  Problem Relation Age of Onset   Lung cancer Brother        smoked   Cancer Other        lung cancer   Hypertension Other    Emphysema Brother        smoked   Asthma Sister    Social History   Socioeconomic History   Marital status: Married    Spouse name: Not on file   Number of children: Not on file   Years of education: Not on file   Highest education level: Not on file  Occupational History   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    Employer: RETIRED  Tobacco Use   Smoking status: Never   Smokeless tobacco: Never   Tobacco comments:    tried smoking in HS  Vaping Use   Vaping Use: Never used  Substance and Sexual Activity   Alcohol use: No   Drug use: No   Sexual activity: Never  Other Topics Concern   Not on file  Social History Narrative   Not on file   Social Determinants of Health   Financial Resource Strain: Low Risk  (09/22/2022)   Overall Financial Resource Strain (CARDIA)    Difficulty of Paying Living Expenses: Not hard at all  Food Insecurity: No Food Insecurity (09/22/2022)   Hunger Vital Sign    Worried About Running Out of Food in the Last Year: Never true     Ran Out of Food in the Last Year: Never true  Transportation Needs: No Transportation Needs (09/22/2022)   PRAPARE - Administrator, Civil Service (Medical): No    Lack of Transportation (Non-Medical): No  Physical Activity: Inactive (09/22/2022)   Exercise Vital Sign    Days of Exercise per Week: 0 days    Minutes of Exercise per Session: 0 min  Stress: No Stress Concern Present (09/22/2022)   [employer] of Occupational Health - Occupational Stress Questionnaire    Feeling of Stress : Not at all  Social Connections: Socially Integrated (09/22/2022)   Social Connection and Isolation Panel [NHANES]    Frequency of Communication with Friends and Family: More than three times a week    Frequency of Social Gatherings with Friends and Family: More than three times a week    Attends Religious Services: More than 4 times per year    Active Member of [employer] or Organizations: Yes    Attends Engineer, structural: More than 4 times per year    Marital Status: Married    Tobacco Counseling Counseling given: Not Answered Tobacco comments: tried smoking in HS   Clinical Intake:  Pre-visit preparation completed: No  Pain : No/denies pain     BMI - recorded: 24.41 Nutritional Status: BMI of 19-24  Normal Nutritional Risks: None Diabetes: No  How often do you need to have someone help you when you read instructions, pamphlets, or other written materials from your doctor or pharmacy?: 3 - Sometimes (Wife assist)  Interpreter Needed?: No  Information entered by :: Theresa Mulligan LPN   Activities of Daily Living    09/22/2022    2:19 PM  In your present state of health, do you have any difficulty performing the following activities:  Hearing? 1  Comment Wears hearing aides  Vision? 0  Difficulty concentrating or making decisions? 0  Walking or climbing stairs? 1  Comment Uses a cane  Dressing or bathing?  0  Doing errands, shopping? 0  Preparing Food and  eating ? N  Using the Toilet? N  In the past six months, have you accidently leaked urine? N  Do you have problems with loss of bowel control? N  Managing your Medications? N  Managing your Finances? N  Housekeeping or managing your Housekeeping? N    Patient Care Team: Corwin Levins, MD as PCP - General  Indicate any recent Medical Services you may have received from other than [facility] providers in the past year (date may be approximate).     Assessment:   This is a routine wellness examination for Brant.  Hearing/Vision screen Hearing Screening - Comments:: Wears hearing aids Vision Screening - Comments:: Wears rx glasses - up to date with routine eye exams with  Dr Cathey Endow  Dietary issues and exercise activities discussed:     Goals Addressed               This Visit's Progress     No current goals (pt-stated)         Depression Screen    09/22/2022    2:18 PM 06/02/2022    3:10 PM 04/25/2022    2:47 PM 04/25/2022    2:12 PM 04/16/2021    2:38 PM 09/22/2020    4:33 PM 11/07/2019    9:34 AM  PHQ 2/9 Scores  PHQ - 2 Score 0 0 0 0 0 0 0  PHQ- 9 Score  0         Fall Risk    09/22/2022    2:20 PM 06/02/2022    3:10 PM 04/25/2022    2:47 PM 04/25/2022    2:12 PM 04/16/2021    2:38 PM  Fall Risk   Falls in the past year? 0 0 0 1 1  Number falls in past yr: 0 0 0 0 0  Injury with Fall? 0 0 0 0 0  Risk for fall due to : No Fall Risks No Fall Risks  No Fall Risks   Follow up Falls prevention discussed Falls evaluation completed;Education provided  Falls evaluation completed     MEDICARE RISK AT HOME:  Medicare Risk at Home - 09/22/22 1424     Any stairs in or around the home? No    If so, are there any without handrails? No    Home free of loose throw rugs in walkways, pet beds, electrical cords, etc? Yes    Adequate lighting in your home to reduce risk of falls? Yes    Life alert? No    Use of a cane, walker or w/c? Yes    Grab bars in the bathroom? Yes    Shower  chair or bench in shower? No    Elevated toilet seat or a handicapped toilet? Yes             TIMED UP AND GO:  Was the test performed?  No    Cognitive Function:    04/13/2017    4:04 PM  MMSE - Mini Mental State Exam  Orientation to time 5  Orientation to Place 5  Registration 3  Attention/ Calculation 5  Recall 1  Language- name 2 objects 2  Language- repeat 1  Language- follow 3 step command 3  Language- read & follow direction 1  Write a sentence 1  Copy design 1  Total score 28        09/22/2022    2:21 PM 11/07/2019  9:39 AM  6CIT Screen  What Year? 0 points 0 points  What month? 0 points 0 points  What time? 0 points 3 points  Count back from 20 2 points 0 points  Months in reverse 2 points 0 points  Repeat phrase 4 points 4 points  Total Score 8 points 7 points    Immunizations Immunization History  Administered Date(s) Administered   Fluad Quad(high Dose 65+) 12/29/2018, 01/07/2020, 12/24/2020   Influenza Split 01/09/2012   Influenza Whole 03/23/2007, 02/02/2009, 01/13/2010   Influenza, High Dose Seasonal PF 02/07/2014, 01/28/2015, 02/28/2017, 01/19/2018, 01/14/2022   Influenza,inj,Quad PF,6+ Mos 12/05/2012   Moderna Sars-Covid-2 Vaccination 04/16/2019, 05/14/2019, 02/28/2020, 08/01/2020   Pneumococcal Conjugate-13 06/05/2013   Pneumococcal Polysaccharide-23 03/23/2007   Respiratory Syncytial Virus Vaccine,Recomb Aduvanted(Arexvy) 03/09/2022   Td 07/31/2008   Tdap 01/09/2019    TDAP status: Up to date  Flu Vaccine status: Up to date  Pneumococcal vaccine status: Up to date  Covid-19 vaccine status: Completed vaccines  Qualifies for Shingles Vaccine? Yes   Zostavax completed No   Shingrix Completed?: No.    Education has been provided regarding the importance of this vaccine. Patient has been advised to call insurance company to determine out of pocket expense if they have not yet received this vaccine. Advised may also receive vaccine at  local pharmacy or Health Dept. Verbalized acceptance and understanding.  Screening Tests Health Maintenance  Topic Date Due   COVID-19 Vaccine (5 - 2023-24 season) 10/08/2022 (Originally 12/03/2021)   Zoster Vaccines- Shingrix (1 of 2) 11/01/2022 (Originally 03/01/1978)   INFLUENZA VACCINE  11/03/2022   Medicare Annual Wellness (AWV)  09/22/2023   DTaP/Tdap/Td (3 - Td or Tdap) 01/08/2029   Pneumonia Vaccine 55+ Years old  Completed   HPV VACCINES  Aged Out    Health Maintenance  There are no preventive care reminders to display for this patient.   Colorectal cancer screening: No longer required.   Lung Cancer Screening: (Low Dose CT Chest recommended if Age 24-80 years, 20 pack-year currently smoking OR have quit w/in 15years.) does not qualify.     Additional Screening:  Hepatitis C Screening: does not qualify; Completed   Vision Screening: Recommended annual ophthalmology exams for early detection of glaucoma and other disorders of the eye. Is the patient up to date with their annual eye exam?  Yes  Who is the provider or what is the name of the office in which the patient attends annual eye exams? Dr Cathey Endow If pt is not established with a provider, would they like to be referred to a provider to establish care? No .   Dental Screening: Recommended annual dental exams for proper oral hygiene   Community Resource Referral / Chronic Care Management:  CRR required this visit?  No   CCM required this visit?  No     Plan:     I have personally reviewed and noted the following in the patient's chart:   Medical and social history Use of alcohol, tobacco or illicit drugs  Current medications and supplements including opioid prescriptions. Patient is not currently taking opioid prescriptions. Functional ability and status Nutritional status Physical activity Advanced directives List of other physicians Hospitalizations, surgeries, and ER visits in previous 12  months Vitals Screenings to include cognitive, depression, and falls Referrals and appointments  In addition, I have reviewed and discussed with patient certain preventive protocols, quality metrics, and best practice recommendations. A written personalized care plan for preventive services as well as general preventive  health recommendations were provided to patient.     Tillie Rung, LPN   [id number]/61/0960   After Visit Summary: (MyChart) Due to this being a telephonic visit, the after visit summary with patients personalized plan was offered to patient via MyChart   Nurse Notes: None

## 2022-10-03 ENCOUNTER — Encounter: Payer: Self-pay | Admitting: Internal Medicine

## 2022-10-03 ENCOUNTER — Ambulatory Visit (INDEPENDENT_AMBULATORY_CARE_PROVIDER_SITE_OTHER): Payer: PPO | Admitting: Internal Medicine

## 2022-10-03 VITALS — BP 120/74 | HR 88 | Temp 98.3°F | Ht 72.0 in | Wt 178.0 lb

## 2022-10-03 DIAGNOSIS — R739 Hyperglycemia, unspecified: Secondary | ICD-10-CM | POA: Diagnosis not present

## 2022-10-03 DIAGNOSIS — J4541 Moderate persistent asthma with (acute) exacerbation: Secondary | ICD-10-CM | POA: Diagnosis not present

## 2022-10-03 DIAGNOSIS — I10 Essential (primary) hypertension: Secondary | ICD-10-CM | POA: Diagnosis not present

## 2022-10-03 DIAGNOSIS — E559 Vitamin D deficiency, unspecified: Secondary | ICD-10-CM | POA: Diagnosis not present

## 2022-10-03 MED ORDER — METHYLPREDNISOLONE ACETATE 80 MG/ML IJ SUSP
80.0000 mg | Freq: Once | INTRAMUSCULAR | Status: AC
  Administered 2022-10-03: 80 mg via INTRAMUSCULAR

## 2022-10-03 MED ORDER — MONTELUKAST SODIUM 10 MG PO TABS: 10.0000 mg | ORAL_TABLET | Freq: Every day | ORAL | 3 refills | Status: DC

## 2022-10-03 MED ORDER — PREDNISONE 10 MG PO TABS: ORAL_TABLET | ORAL | 0 refills | Status: DC

## 2022-10-03 MED ORDER — FLUTICASONE PROPIONATE HFA 220 MCG/ACT IN AERO: 2.0000 | INHALATION_SPRAY | Freq: Two times a day (BID) | RESPIRATORY_TRACT | 12 refills | Status: DC

## 2022-10-03 NOTE — Assessment & Plan Note (Signed)
Lab Results  Component Value Date   HGBA1C 6.3 04/25/2022   Stable, pt to continue current medical treatment  - diet, wt control  

## 2022-10-03 NOTE — Assessment & Plan Note (Signed)
Mild to mod, for depomedrol 80 mg IM, prednisone taper, add singulari 10 every day, increase flovent to 220, cont albuterol nebs,  to f/u any worsening symptoms or concerns

## 2022-10-03 NOTE — Progress Notes (Signed)
Patient ID: Stephen Wilkins, male   DOB: 10/24/1927, 89 y.o.   MRN: [id number]        Chief Complaint: follow up uncontrolled asthma       HPI:  Stephen Wilkins is a 89 y.o. male here with c/o asthma worsening again after so much imrpoved with last steroid tx, just seems to keep getting worse again;  has had significant difficulty with med adherence and dementia in past but states again he is taking all listed meds including flovent and albuterol nebs.  Pt denies chest pain,  orthopnea, PND, increased LE swelling, palpitations, dizziness or syncope.   Pt denies polydipsia, polyuria, or new focal neuro s/s.    Pt denies fever, wt loss, night sweats, loss of appetite, or other constitutional symptoms         Wt Readings from Last 3 Encounters:  10/03/22 178 lb (80.7 kg)  09/22/22 180 lb (81.6 kg)  08/02/22 180 lb (81.6 kg)   BP Readings from Last 3 Encounters:  10/03/22 120/74  08/02/22 124/76  06/28/22 (!) 142/87         Past Medical History:  Diagnosis Date   ABNORMAL ELECTROCARDIOGRAM 06/21/2007   ALLERGIC RHINITIS 03/23/2007   Anxiety state, unspecified 09/06/2013   ASTHMATIC BRONCHITIS, ACUTE 04/27/2007   BENIGN PROSTATIC HYPERTROPHY 03/23/2007   BRONCHITIS NOT SPECIFIED AS ACUTE OR CHRONIC 04/21/2007   BURSITIS, RIGHT HIP 07/31/2008   COPD (chronic obstructive pulmonary disease) (HCC) 04/19/2016   Cough 01/29/2009   Dementia (HCC) 09/01/2010   ERECTILE DYSFUNCTION 03/23/2007   ERECTILE DYSFUNCTION, ORGANIC 04/17/2009   Esophageal stricture    FREQUENCY, URINARY 04/26/2010   GERD 03/23/2007   HIATAL HERNIA    HYPERLIPIDEMIA 03/23/2007   HYPERTENSION 03/23/2007   MILD COGNITIVE IMPAIRMENT SO STATED 05/19/2010   NEPHROLITHIASIS, HX OF 03/23/2007   PSA, INCREASED 06/27/2008   RASH-NONVESICULAR 03/23/2007   REACTIVE AIRWAY DISEASE 01/14/2010   SCHATZKI'S RING    SCIATICA, RIGHT 04/28/2008   Unspecified hypothyroidism 09/06/2013   Wheezing 04/17/2009   Past Surgical History:  Procedure  Laterality Date   ERCP N/A 12/28/2018   Procedure: ENDOSCOPIC RETROGRADE CHOLANGIOPANCREATOGRAPHY (ERCP);  Surgeon: Meryl Dare, MD;  Location: [facility];  Service: Endoscopy;  Laterality: N/A;   REMOVAL OF STONES  12/28/2018   Procedure: REMOVAL OF STONES;  Surgeon: Meryl Dare, MD;  Location: [facility];  Service: Endoscopy;;  balloon sweep   SPHINCTEROTOMY  12/28/2018   Procedure: Dennison Mascot;  Surgeon: Meryl Dare, MD;  Location: [facility];  Service: Endoscopy;;   TONSILLECTOMY      reports that he has never smoked. He has never used smokeless tobacco. He reports that he does not drink alcohol and does not use drugs. family history includes Asthma in his sister; Cancer in an other family member; Emphysema in his brother; Hypertension in an other family member; Lung cancer in his brother. Allergies  Allergen Reactions   Atorvastatin     REACTION: myalgia   Doxazosin Mesylate     REACTION: dizziness   Hydrocodone Bit-Homatrop Mbr Other (See Comments)    anxiety   Hydrocodone Anxiety   Current Outpatient Medications on File Prior to Visit  Medication Sig Dispense Refill   albuterol (PROVENTIL) (2.5 MG/3ML) 0.083% nebulizer solution Take 3 mLs (2.5 mg total) by nebulization every 6 (six) hours as needed for wheezing or shortness of breath. 150 mL 2   albuterol (VENTOLIN HFA) 108 (90 Base) MCG/ACT inhaler INHALE 2 PUFFS BY MOUTH EVERY 6  HOURS AS NEEDED FOR WHEEZING FOR SHORTNESS OF BREATH 9 g 0   ALPRAZolam (XANAX) 0.5 MG tablet 1/2 - 1 tab by mouth twice per day as needed 60 tablet 2   aspirin EC 81 MG tablet Take by mouth.     budesonide-formoterol (SYMBICORT) 80-4.5 MCG/ACT inhaler      cetirizine (ZYRTEC) 10 MG tablet Take 1 tablet (10 mg total) by mouth daily. 30 tablet 11   Cholecalciferol (VITAMIN D) 1000 UNITS capsule Take 1,000 Units by mouth every other day.      Cholecalciferol 50 MCG (2000 UT) TABS 1 tab by mouth once daily 30 tablet 99    cyanocobalamin 50 MCG tablet TAKE BY MOUTH EVERY 7 DAYS     desoximetasone (TOPICORT) 0.25 % cream APPLY SPARINGLY TO THE AFFECTED AREA TWICE DAILY AS NEEDED AS DIRECTED (Patient taking differently: Apply 1 application  topically 2 (two) times daily. Itching) 30 g 1   Fluticasone Propionate, Inhal, 250 MCG/ACT AEPB Inhale 1 puff into the lungs 2 (two) times daily. 3 each 3   lansoprazole (PREVACID) 30 MG capsule TAKE 1 CAPSULE BY MOUTH ONCE DAILY AT  12  NOON 90 capsule 2   levothyroxine (SYNTHROID) 112 MCG tablet Take 1 tablet by mouth once daily 90 tablet 3   lidocaine (LIDODERM) 5 % Place 1 patch onto the skin daily. Remove & Discard patch within 12 hours or as directed by MD 30 patch 0   losartan (COZAAR) 100 MG tablet Take 0.5 tablets (50 mg total) by mouth daily. 45 tablet 3   LYCOPENE PO Take by mouth.     methylPREDNISolone (MEDROL DOSEPAK) 4 MG TBPK tablet Take 24 mg on day 1, 20 mg on day 2, 16 mg on day 3, 12 mg on day 4, 8 mg on day 5, 4 mg on day 6.  Take all tablets in each row at once, do not spread tablets out throughout the day. 21 tablet 0   Multiple Vitamin (MULTIVITAMINS PO) Take 1 tablet by mouth daily.     Multiple Vitamins-Minerals (CENTRUM SILVER PO) Take 1 tablet by mouth daily.      polyethylene glycol powder (MIRALAX) powder Take 1/2 capful by mouth once daily (Patient taking differently: Take 17 g by mouth 2 (two) times a week.) 850 g 5   rosuvastatin (CRESTOR) 10 MG tablet Take 1 tablet by mouth once daily 90 tablet 3   RSV vaccine recomb adjuvanted (AREXVY) 120 MCG/0.5ML injection Inject into the muscle. 0.5 mL 0   sodium chloride (MURO 128) 5 % ophthalmic solution INSTILL 1 DROP IN BOTH EYES FOUR TIMES A DAY FOR CORNEAL EDEMA     solifenacin (VESICARE) 5 MG tablet Take 1 tablet (5 mg total) by mouth daily. 90 tablet 3   tamsulosin (FLOMAX) 0.4 MG CAPS capsule TAKE 1 CAPSULE(0.4 MG) BY MOUTH AT BEDTIME 90 capsule 3   vitamin B-12 (CYANOCOBALAMIN) 100 MCG tablet  Take 100 mcg by mouth once a week.     vitamin C (ASCORBIC ACID) 500 MG tablet Take 1,000 mg by mouth daily.      Current Facility-Administered Medications on File Prior to Visit  Medication Dose Route Frequency Provider Last Rate Last Admin   albuterol (PROVENTIL) (2.5 MG/3ML) 0.083% nebulizer solution 2.5 mg  2.5 mg Nebulization Once Corwin Levins, MD            ROS:  All others reviewed and negative.  Objective        PE:  BP 120/74 (BP Location: Right Arm, Patient Position: Sitting, Cuff Size: Normal)   Pulse 88   Temp 98.3 F (36.8 C) (Oral)   Ht 6' (1.829 m)   Wt 178 lb (80.7 kg)   SpO2 93%   BMI 24.14 kg/m                 Constitutional: Pt appears in NAD               HENT: Head: NCAT.                Right Ear: External ear normal.                 Left Ear: External ear normal.                Eyes: . Pupils are equal, round, and reactive to light. Conjunctivae and EOM are normal               Nose: without d/c or deformity               Neck: Neck supple. Gross normal ROM               Cardiovascular: Normal rate and regular rhythm.                 Pulmonary/Chest: Effort normal and breath sounds decreased without rales but with bilat wheezing.                Abd:  Soft, NT, ND, + BS, no organomegaly               Neurological: Pt is alert. At baseline orientation, motor grossly intact               Skin: Skin is warm. No rashes, no other new lesions, LE edema - none               Psychiatric: Pt behavior is normal without agitation   Micro: none  Cardiac tracings I have personally interpreted today:  none  Pertinent Radiological findings (summarize): none   Lab Results  Component Value Date   WBC 7.7 06/28/2022   HGB 12.5 (L) 06/28/2022   HCT 39.4 06/28/2022   PLT 202 06/28/2022   GLUCOSE 119 (H) 06/28/2022   CHOL 149 04/25/2022   TRIG 207.0 (H) 04/25/2022   HDL 52.00 04/25/2022   LDLDIRECT 74.0 04/25/2022   LDLCALC 63 10/21/2021   ALT 10 04/25/2022    AST 18 04/25/2022   NA 140 06/28/2022   K 4.6 06/28/2022   CL 102 06/28/2022   CREATININE 1.83 (H) 06/28/2022   BUN 38 (H) 06/28/2022   CO2 29 06/28/2022   TSH 2.55 04/25/2022   PSA 3.94 03/12/2014   HGBA1C 6.3 04/25/2022   Assessment/Plan:  Stephen Wilkins is a 4 y.o. White or Caucasian [1] male with  has a past medical history of ABNORMAL ELECTROCARDIOGRAM (06/21/2007), ALLERGIC RHINITIS (03/23/2007), Anxiety state, unspecified (09/06/2013), ASTHMATIC BRONCHITIS, ACUTE (04/27/2007), BENIGN PROSTATIC HYPERTROPHY (03/23/2007), BRONCHITIS NOT SPECIFIED AS ACUTE OR CHRONIC (04/21/2007), BURSITIS, RIGHT HIP (07/31/2008), COPD (chronic obstructive pulmonary disease) (HCC) (04/19/2016), Cough (01/29/2009), Dementia (HCC) (09/01/2010), ERECTILE DYSFUNCTION (03/23/2007), ERECTILE DYSFUNCTION, ORGANIC (04/17/2009), Esophageal stricture, FREQUENCY, URINARY (04/26/2010), GERD (03/23/2007), HIATAL HERNIA, HYPERLIPIDEMIA (03/23/2007), HYPERTENSION (03/23/2007), MILD COGNITIVE IMPAIRMENT SO STATED (05/19/2010), NEPHROLITHIASIS, HX OF (03/23/2007), PSA, INCREASED (06/27/2008), RASH-NONVESICULAR (03/23/2007), REACTIVE AIRWAY DISEASE (01/14/2010), SCHATZKI'S RING, SCIATICA, RIGHT (04/28/2008), Unspecified hypothyroidism (09/06/2013), and Wheezing (04/17/2009).  Asthma with exacerbation Mild  to mod, for depomedrol 80 mg IM, prednisone taper, add singulari 10 every day, increase flovent to 220, cont albuterol nebs,  to f/u any worsening symptoms or concerns  Essential hypertension BP Readings from Last 3 Encounters:  10/03/22 120/74  08/02/22 124/76  06/28/22 (!) 142/87   Stable, pt to continue medical treatment losartam 50 qd   Hyperglycemia Lab Results  Component Value Date   HGBA1C 6.3 04/25/2022   Stable, pt to continue current medical treatment  - diet, wt control   Vitamin D deficiency Last vitamin D Lab Results  Component Value Date   VD25OH 36.66 04/25/2022   Low, to start oral replacement  Followup:  Return if symptoms worsen or fail to improve.  Oliver Barre, MD 10/03/2022 8:14 PM [facility]

## 2022-10-03 NOTE — Assessment & Plan Note (Signed)
BP Readings from Last 3 Encounters:  10/03/22 120/74  08/02/22 124/76  06/28/22 (!) 142/87   Stable, pt to continue medical treatment losartam 50 qd

## 2022-10-03 NOTE — Patient Instructions (Signed)
You had the steroid shot today  Please take all new medication as prescribed - the prednisone, and also singulair 10 mg per day, and the higher strength Flovent twice per day  Please continue all other medications as before, and refills have been done if requested.  Please have the pharmacy call with any other refills you may need.  Please keep your appointments with your specialists as you may have planned  Please let me know if you change your mind about chest xray or pulmonary referral

## 2022-10-03 NOTE — Assessment & Plan Note (Signed)
Last vitamin D Lab Results  Component Value Date   VD25OH 36.66 04/25/2022   Low, to start oral replacement  

## 2022-10-18 DIAGNOSIS — B351 Tinea unguium: Secondary | ICD-10-CM | POA: Diagnosis not present

## 2022-10-18 DIAGNOSIS — M79674 Pain in right toe(s): Secondary | ICD-10-CM | POA: Diagnosis not present

## 2022-10-18 DIAGNOSIS — M79675 Pain in left toe(s): Secondary | ICD-10-CM | POA: Diagnosis not present

## 2022-10-18 DIAGNOSIS — L97511 Non-pressure chronic ulcer of other part of right foot limited to breakdown of skin: Secondary | ICD-10-CM | POA: Diagnosis not present

## 2022-10-18 DIAGNOSIS — L603 Nail dystrophy: Secondary | ICD-10-CM | POA: Diagnosis not present

## 2022-10-18 DIAGNOSIS — I739 Peripheral vascular disease, unspecified: Secondary | ICD-10-CM | POA: Diagnosis not present

## 2022-10-18 DIAGNOSIS — L600 Ingrowing nail: Secondary | ICD-10-CM | POA: Diagnosis not present

## 2022-10-24 ENCOUNTER — Ambulatory Visit: Payer: PPO | Admitting: Internal Medicine

## 2022-11-01 ENCOUNTER — Ambulatory Visit: Payer: PPO | Admitting: Internal Medicine

## 2022-11-08 ENCOUNTER — Ambulatory Visit (INDEPENDENT_AMBULATORY_CARE_PROVIDER_SITE_OTHER): Payer: PPO | Admitting: Internal Medicine

## 2022-11-08 VITALS — BP 128/90 | HR 94 | Temp 98.4°F | Ht 72.0 in | Wt 178.4 lb

## 2022-11-08 DIAGNOSIS — R739 Hyperglycemia, unspecified: Secondary | ICD-10-CM

## 2022-11-08 DIAGNOSIS — E7800 Pure hypercholesterolemia, unspecified: Secondary | ICD-10-CM

## 2022-11-08 DIAGNOSIS — N3281 Overactive bladder: Secondary | ICD-10-CM

## 2022-11-08 DIAGNOSIS — H9390 Unspecified disorder of ear, unspecified ear: Secondary | ICD-10-CM

## 2022-11-08 DIAGNOSIS — E559 Vitamin D deficiency, unspecified: Secondary | ICD-10-CM | POA: Diagnosis not present

## 2022-11-08 DIAGNOSIS — J411 Mucopurulent chronic bronchitis: Secondary | ICD-10-CM

## 2022-11-08 DIAGNOSIS — I10 Essential (primary) hypertension: Secondary | ICD-10-CM | POA: Diagnosis not present

## 2022-11-08 NOTE — Patient Instructions (Signed)
Please continue all other medications as before, and refills have been done if requested.  Please have the pharmacy call with any other refills you may need.  Please continue your efforts at being more active, low cholesterol diet, and weight control.  Please keep your appointments with your specialists as you may have planned  You will be contacted regarding the referral for: dermatology  Please go to the LAB at the blood drawing area for the tests to be done  You will be contacted by phone if any changes need to be made immediately.  Otherwise, you will receive a letter about your results with an explanation, but please check with MyChart first.  Please make an Appointment to return in 6 months, or sooner if needed

## 2022-11-08 NOTE — Progress Notes (Unsigned)
Patient ID: Stephen Wilkins, male   DOB: 06-20-1927, 89 y.o.   MRN: [id number]        Chief Complaint: follow up asthma copd, right ear lesion, oab,        HPI:  Stephen Wilkins is a 89 y.o. male here with wife, Pt denies chest pain, increased sob or doe, wheezing, orthopnea, PND, increased LE swelling, palpitations, dizziness or syncope.   Pt denies polydipsia, polyuria, or new focal neuro s/s.    Pt denies fever, wt loss, night sweats, loss of appetite, or other constitutional symptoms  Does have right ear non healing red raised lesion to lower aspect edge of pinna.  Denies urinary symptoms such as dysuria, urgency, flank pain, hematuria or n/v, fever, chills. But has worsening frequency again after stopped his vesicare for some reason, but willing to restart.        Wt Readings from Last 3 Encounters:  11/08/22 178 lb 6.4 oz (80.9 kg)  10/03/22 178 lb (80.7 kg)  09/22/22 180 lb (81.6 kg)   BP Readings from Last 3 Encounters:  11/08/22 (!) 128/90  10/03/22 120/74  08/02/22 124/76         Past Medical History:  Diagnosis Date   ABNORMAL ELECTROCARDIOGRAM 06/21/2007   ALLERGIC RHINITIS 03/23/2007   Anxiety state, unspecified 09/06/2013   ASTHMATIC BRONCHITIS, ACUTE 04/27/2007   BENIGN PROSTATIC HYPERTROPHY 03/23/2007   BRONCHITIS NOT SPECIFIED AS ACUTE OR CHRONIC 04/21/2007   BURSITIS, RIGHT HIP 07/31/2008   COPD (chronic obstructive pulmonary disease) (HCC) 04/19/2016   Cough 01/29/2009   Dementia (HCC) 09/01/2010   ERECTILE DYSFUNCTION 03/23/2007   ERECTILE DYSFUNCTION, ORGANIC 04/17/2009   Esophageal stricture    FREQUENCY, URINARY 04/26/2010   GERD 03/23/2007   HIATAL HERNIA    HYPERLIPIDEMIA 03/23/2007   HYPERTENSION 03/23/2007   MILD COGNITIVE IMPAIRMENT SO STATED 05/19/2010   NEPHROLITHIASIS, HX OF 03/23/2007   PSA, INCREASED 06/27/2008   RASH-NONVESICULAR 03/23/2007   REACTIVE AIRWAY DISEASE 01/14/2010   SCHATZKI'S RING    SCIATICA, RIGHT 04/28/2008   Unspecified hypothyroidism  09/06/2013   Wheezing 04/17/2009   Past Surgical History:  Procedure Laterality Date   ERCP N/A 12/28/2018   Procedure: ENDOSCOPIC RETROGRADE CHOLANGIOPANCREATOGRAPHY (ERCP);  Surgeon: Meryl Dare, MD;  Location: [facility];  Service: Endoscopy;  Laterality: N/A;   REMOVAL OF STONES  12/28/2018   Procedure: REMOVAL OF STONES;  Surgeon: Meryl Dare, MD;  Location: [facility];  Service: Endoscopy;;  balloon sweep   SPHINCTEROTOMY  12/28/2018   Procedure: Dennison Mascot;  Surgeon: Meryl Dare, MD;  Location: [facility];  Service: Endoscopy;;   TONSILLECTOMY      reports that he has never smoked. He has never used smokeless tobacco. He reports that he does not drink alcohol and does not use drugs. family history includes Asthma in his sister; Cancer in an other family member; Emphysema in his brother; Hypertension in an other family member; Lung cancer in his brother. Allergies  Allergen Reactions   Atorvastatin     REACTION: myalgia   Doxazosin Mesylate     REACTION: dizziness   Hydrocodone Bit-Homatrop Mbr Other (See Comments)    anxiety   Hydrocodone Anxiety   Current Outpatient Medications on File Prior to Visit  Medication Sig Dispense Refill   albuterol (PROVENTIL) (2.5 MG/3ML) 0.083% nebulizer solution Take 3 mLs (2.5 mg total) by nebulization every 6 (six) hours as needed for wheezing or shortness of breath. 150 mL 2   albuterol (VENTOLIN HFA)  108 (90 Base) MCG/ACT inhaler INHALE 2 PUFFS BY MOUTH EVERY 6 HOURS AS NEEDED FOR WHEEZING FOR SHORTNESS OF BREATH 9 g 0   ALPRAZolam (XANAX) 0.5 MG tablet 1/2 - 1 tab by mouth twice per day as needed 60 tablet 2   aspirin EC 81 MG tablet Take by mouth.     budesonide-formoterol (SYMBICORT) 80-4.5 MCG/ACT inhaler      cetirizine (ZYRTEC) 10 MG tablet Take 1 tablet (10 mg total) by mouth daily. 30 tablet 11   Cholecalciferol (VITAMIN D) 1000 UNITS capsule Take 1,000 Units by mouth every other day.      Cholecalciferol 50 MCG  (2000 UT) TABS 1 tab by mouth once daily 30 tablet 99   cyanocobalamin 50 MCG tablet TAKE BY MOUTH EVERY 7 DAYS     desoximetasone (TOPICORT) 0.25 % cream APPLY SPARINGLY TO THE AFFECTED AREA TWICE DAILY AS NEEDED AS DIRECTED (Patient taking differently: Apply 1 application  topically 2 (two) times daily. Itching) 30 g 1   fluticasone (FLOVENT HFA) 220 MCG/ACT inhaler Inhale 2 puffs into the lungs in the morning and at bedtime. 1 each 12   lansoprazole (PREVACID) 30 MG capsule TAKE 1 CAPSULE BY MOUTH ONCE DAILY AT  12  NOON 90 capsule 2   levothyroxine (SYNTHROID) 112 MCG tablet Take 1 tablet by mouth once daily 90 tablet 3   lidocaine (LIDODERM) 5 % Place 1 patch onto the skin daily. Remove & Discard patch within 12 hours or as directed by MD 30 patch 0   losartan (COZAAR) 100 MG tablet Take 0.5 tablets (50 mg total) by mouth daily. 45 tablet 3   LYCOPENE PO Take by mouth.     methylPREDNISolone (MEDROL DOSEPAK) 4 MG TBPK tablet Take 24 mg on day 1, 20 mg on day 2, 16 mg on day 3, 12 mg on day 4, 8 mg on day 5, 4 mg on day 6.  Take all tablets in each row at once, do not spread tablets out throughout the day. 21 tablet 0   montelukast (SINGULAIR) 10 MG tablet Take 1 tablet (10 mg total) by mouth at bedtime. 30 tablet 3   Multiple Vitamin (MULTIVITAMINS PO) Take 1 tablet by mouth daily.     Multiple Vitamins-Minerals (CENTRUM SILVER PO) Take 1 tablet by mouth daily.      polyethylene glycol powder (MIRALAX) powder Take 1/2 capful by mouth once daily (Patient taking differently: Take 17 g by mouth 2 (two) times a week.) 850 g 5   predniSONE (DELTASONE) 10 MG tablet 3 tabs by mouth per day for 3 days,2tabs per day for 3 days,1tab per day for 3 days 18 tablet 0   rosuvastatin (CRESTOR) 10 MG tablet Take 1 tablet by mouth once daily 90 tablet 3   RSV vaccine recomb adjuvanted (AREXVY) 120 MCG/0.5ML injection Inject into the muscle. 0.5 mL 0   sodium chloride (MURO 128) 5 % ophthalmic solution  INSTILL 1 DROP IN BOTH EYES FOUR TIMES A DAY FOR CORNEAL EDEMA     solifenacin (VESICARE) 5 MG tablet Take 1 tablet (5 mg total) by mouth daily. 90 tablet 3   tamsulosin (FLOMAX) 0.4 MG CAPS capsule TAKE 1 CAPSULE(0.4 MG) BY MOUTH AT BEDTIME 90 capsule 3   vitamin B-12 (CYANOCOBALAMIN) 100 MCG tablet Take 100 mcg by mouth once a week.     vitamin C (ASCORBIC ACID) 500 MG tablet Take 1,000 mg by mouth daily.      Fluticasone Propionate, Inhal, 250  MCG/ACT AEPB Inhale 1 puff into the lungs 2 (two) times daily. 3 each 3   Current Facility-Administered Medications on File Prior to Visit  Medication Dose Route Frequency Provider Last Rate Last Admin   albuterol (PROVENTIL) (2.5 MG/3ML) 0.083% nebulizer solution 2.5 mg  2.5 mg Nebulization Once Corwin Levins, MD            ROS:  All others reviewed and negative.  Objective        PE:  BP (!) 128/90 (BP Location: Right Arm, Patient Position: Sitting, Cuff Size: Normal)   Pulse 94   Temp 98.4 F (36.9 C) (Oral)   Ht 6' (1.829 m)   Wt 178 lb 6.4 oz (80.9 kg)   SpO2 95%   BMI 24.20 kg/m                 Constitutional: Pt appears in NAD               HENT: Head: NCAT.                Right Ear: External ear normal.                 Left Ear: External ear normal.                Eyes: . Pupils are equal, round, and reactive to light. Conjunctivae and EOM are normal               Nose: without d/c or deformity               Neck: Neck supple. Gross normal ROM               Cardiovascular: Normal rate and regular rhythm.                 Pulmonary/Chest: Effort normal and breath sounds without rales or wheezing.                Abd:  Soft, NT, ND, + BS, no organomegaly               Neurological: Pt is alert. At baseline orientation, motor grossly intact               Skin: Skin is warm. Right pinna lesion approx 15 mm red raised nontender to lower pinna , LE edema - none               Psychiatric: Pt behavior is normal without agitation   Micro:  none  Cardiac tracings I have personally interpreted today:  none  Pertinent Radiological findings (summarize): none   Lab Results  Component Value Date   WBC 8.3 11/08/2022   HGB 12.7 (L) 11/08/2022   HCT 39.8 11/08/2022   PLT 247.0 11/08/2022   GLUCOSE 97 11/08/2022   CHOL 212 (H) 11/08/2022   TRIG 146.0 11/08/2022   HDL 63.70 11/08/2022   LDLDIRECT 74.0 04/25/2022   LDLCALC 119 (H) 11/08/2022   ALT 11 11/08/2022   AST 19 11/08/2022   NA 140 11/08/2022   K 4.2 11/08/2022   CL 102 11/08/2022   CREATININE 1.75 (H) 11/08/2022   BUN 34 (H) 11/08/2022   CO2 30 11/08/2022   TSH 2.55 04/25/2022   PSA 3.94 03/12/2014   HGBA1C 6.2 11/08/2022   Assessment/Plan:  Stephen Wilkins is a 74 y.o. White or Caucasian [1] male with  has a past medical history of ABNORMAL ELECTROCARDIOGRAM (06/21/2007), ALLERGIC RHINITIS (03/23/2007),  Anxiety state, unspecified (09/06/2013), ASTHMATIC BRONCHITIS, ACUTE (04/27/2007), BENIGN PROSTATIC HYPERTROPHY (03/23/2007), BRONCHITIS NOT SPECIFIED AS ACUTE OR CHRONIC (04/21/2007), BURSITIS, RIGHT HIP (07/31/2008), COPD (chronic obstructive pulmonary disease) (HCC) (04/19/2016), Cough (01/29/2009), Dementia (HCC) (09/01/2010), ERECTILE DYSFUNCTION (03/23/2007), ERECTILE DYSFUNCTION, ORGANIC (04/17/2009), Esophageal stricture, FREQUENCY, URINARY (04/26/2010), GERD (03/23/2007), HIATAL HERNIA, HYPERLIPIDEMIA (03/23/2007), HYPERTENSION (03/23/2007), MILD COGNITIVE IMPAIRMENT SO STATED (05/19/2010), NEPHROLITHIASIS, HX OF (03/23/2007), PSA, INCREASED (06/27/2008), RASH-NONVESICULAR (03/23/2007), REACTIVE AIRWAY DISEASE (01/14/2010), SCHATZKI'S RING, SCIATICA, RIGHT (04/28/2008), Unspecified hypothyroidism (09/06/2013), and Wheezing (04/17/2009).  COPD (chronic obstructive pulmonary disease) (HCC) Stable overall improved, cont current inhaler  Essential hypertension BP Readings from Last 3 Encounters:  11/08/22 (!) 128/90  10/03/22 120/74  08/02/22 124/76   Stable, pt to continue  medical treatment losartan 100 mg qd   Hyperglycemia Lab Results  Component Value Date   HGBA1C 6.2 11/08/2022   Stable, pt to continue current medical treatment  - diet, wt control   Vitamin D deficiency Last vitamin D Lab Results  Component Value Date   VD25OH 36.66 04/25/2022   Low, to start oral replacement   OAB (overactive bladder) Mild to mod, For reason he cannot say has gotten away from taking vesicare , but willing to restart,   Lesion of ear C/w probable skin ca - for derm referral  Hyperlipidemia Lab Results  Component Value Date   LDLCALC 119 (H) 11/08/2022   Uncontrolled, , pt to continue current statin crestor 10 every day, delcines change today   Followup: Return in about 6 months (around 05/11/2023).  Oliver Barre, MD 11/09/2022 7:38 PM [facility] [place] [facility] - [facility]

## 2022-11-09 ENCOUNTER — Encounter: Payer: Self-pay | Admitting: Internal Medicine

## 2022-11-09 DIAGNOSIS — N3281 Overactive bladder: Secondary | ICD-10-CM | POA: Insufficient documentation

## 2022-11-09 DIAGNOSIS — H9390 Unspecified disorder of ear, unspecified ear: Secondary | ICD-10-CM | POA: Insufficient documentation

## 2022-11-09 NOTE — Assessment & Plan Note (Signed)
Last vitamin D Lab Results  Component Value Date   VD25OH 36.66 04/25/2022   Low, to start oral replacement

## 2022-11-09 NOTE — Assessment & Plan Note (Signed)
Mild to mod, For reason he cannot say has gotten away from taking vesicare , but willing to restart,

## 2022-11-09 NOTE — Assessment & Plan Note (Signed)
BP Readings from Last 3 Encounters:  11/08/22 (!) 128/90  10/03/22 120/74  08/02/22 124/76   Stable, pt to continue medical treatment losartan 100 mg qd

## 2022-11-09 NOTE — Progress Notes (Signed)
The test results show that your current treatment is OK, as the tests are stable.  Please continue the same plan.  There is no other need for change of treatment or further evaluation based on these results, at this time.  thanks 

## 2022-11-09 NOTE — Assessment & Plan Note (Signed)
C/w probable skin ca - for derm referral

## 2022-11-09 NOTE — Assessment & Plan Note (Signed)
Lab Results  Component Value Date   HGBA1C 6.2 11/08/2022   Stable, pt to continue current medical treatment  - diet, wt control

## 2022-11-09 NOTE — Assessment & Plan Note (Signed)
Lab Results  Component Value Date   LDLCALC 119 (H) 11/08/2022   Uncontrolled, , pt to continue current statin crestor 10 every day, delcines change today

## 2022-11-09 NOTE — Assessment & Plan Note (Signed)
Stable overall improved, cont current inhaler

## 2022-11-11 ENCOUNTER — Encounter (HOSPITAL_COMMUNITY): Payer: Self-pay | Admitting: Internal Medicine

## 2022-11-11 ENCOUNTER — Other Ambulatory Visit: Payer: Self-pay

## 2022-11-11 ENCOUNTER — Emergency Department (HOSPITAL_COMMUNITY): Payer: No Typology Code available for payment source

## 2022-11-11 ENCOUNTER — Inpatient Hospital Stay (HOSPITAL_COMMUNITY)
Admission: EM | Admit: 2022-11-11 | Discharge: 2022-11-13 | DRG: 348 | Disposition: A | Discharge status: Home / Self Care | Payer: No Typology Code available for payment source | Attending: Internal Medicine | Admitting: Internal Medicine

## 2022-11-11 DIAGNOSIS — D120 Benign neoplasm of cecum: Secondary | ICD-10-CM | POA: Diagnosis not present

## 2022-11-11 DIAGNOSIS — Z825 Family history of asthma and other chronic lower respiratory diseases: Secondary | ICD-10-CM

## 2022-11-11 DIAGNOSIS — E039 Hypothyroidism, unspecified: Secondary | ICD-10-CM | POA: Diagnosis present

## 2022-11-11 DIAGNOSIS — Z885 Allergy status to narcotic agent status: Secondary | ICD-10-CM

## 2022-11-11 DIAGNOSIS — K5909 Other constipation: Secondary | ICD-10-CM | POA: Diagnosis present

## 2022-11-11 DIAGNOSIS — R7989 Other specified abnormal findings of blood chemistry: Secondary | ICD-10-CM | POA: Diagnosis present

## 2022-11-11 DIAGNOSIS — I700 Atherosclerosis of aorta: Secondary | ICD-10-CM | POA: Diagnosis present

## 2022-11-11 DIAGNOSIS — K5731 Diverticulosis of large intestine without perforation or abscess with bleeding: Principal | ICD-10-CM | POA: Diagnosis present

## 2022-11-11 DIAGNOSIS — Z79899 Other long term (current) drug therapy: Secondary | ICD-10-CM

## 2022-11-11 DIAGNOSIS — K921 Melena: Secondary | ICD-10-CM | POA: Diagnosis not present

## 2022-11-11 DIAGNOSIS — D124 Benign neoplasm of descending colon: Secondary | ICD-10-CM | POA: Diagnosis present

## 2022-11-11 DIAGNOSIS — Z888 Allergy status to other drugs, medicaments and biological substances status: Secondary | ICD-10-CM

## 2022-11-11 DIAGNOSIS — Z66 Do not resuscitate: Secondary | ICD-10-CM | POA: Diagnosis present

## 2022-11-11 DIAGNOSIS — J4550 Severe persistent asthma, uncomplicated: Secondary | ICD-10-CM | POA: Diagnosis present

## 2022-11-11 DIAGNOSIS — N1832 Chronic kidney disease, stage 3b: Secondary | ICD-10-CM | POA: Diagnosis present

## 2022-11-11 DIAGNOSIS — Z860100 Personal history of colon polyps, unspecified: Secondary | ICD-10-CM

## 2022-11-11 DIAGNOSIS — R634 Abnormal weight loss: Secondary | ICD-10-CM | POA: Diagnosis not present

## 2022-11-11 DIAGNOSIS — K7689 Other specified diseases of liver: Secondary | ICD-10-CM | POA: Diagnosis present

## 2022-11-11 DIAGNOSIS — N189 Chronic kidney disease, unspecified: Secondary | ICD-10-CM | POA: Diagnosis present

## 2022-11-11 DIAGNOSIS — Z7951 Long term (current) use of inhaled steroids: Secondary | ICD-10-CM

## 2022-11-11 DIAGNOSIS — D123 Benign neoplasm of transverse colon: Secondary | ICD-10-CM | POA: Diagnosis not present

## 2022-11-11 DIAGNOSIS — I10 Essential (primary) hypertension: Secondary | ICD-10-CM | POA: Diagnosis present

## 2022-11-11 DIAGNOSIS — K641 Second degree hemorrhoids: Secondary | ICD-10-CM | POA: Diagnosis present

## 2022-11-11 DIAGNOSIS — I129 Hypertensive chronic kidney disease with stage 1 through stage 4 chronic kidney disease, or unspecified chronic kidney disease: Secondary | ICD-10-CM | POA: Diagnosis present

## 2022-11-11 DIAGNOSIS — D62 Acute posthemorrhagic anemia: Secondary | ICD-10-CM | POA: Diagnosis present

## 2022-11-11 DIAGNOSIS — F0390 Unspecified dementia without behavioral disturbance: Secondary | ICD-10-CM | POA: Diagnosis present

## 2022-11-11 DIAGNOSIS — Z7982 Long term (current) use of aspirin: Secondary | ICD-10-CM

## 2022-11-11 DIAGNOSIS — Z8719 Personal history of other diseases of the digestive system: Secondary | ICD-10-CM

## 2022-11-11 DIAGNOSIS — D128 Benign neoplasm of rectum: Secondary | ICD-10-CM | POA: Diagnosis not present

## 2022-11-11 DIAGNOSIS — Z87891 Personal history of nicotine dependence: Secondary | ICD-10-CM

## 2022-11-11 DIAGNOSIS — J449 Chronic obstructive pulmonary disease, unspecified: Secondary | ICD-10-CM | POA: Diagnosis present

## 2022-11-11 DIAGNOSIS — Z8249 Family history of ischemic heart disease and other diseases of the circulatory system: Secondary | ICD-10-CM

## 2022-11-11 DIAGNOSIS — J4489 Other specified chronic obstructive pulmonary disease: Secondary | ICD-10-CM | POA: Diagnosis present

## 2022-11-11 DIAGNOSIS — E785 Hyperlipidemia, unspecified: Secondary | ICD-10-CM | POA: Diagnosis present

## 2022-11-11 DIAGNOSIS — K922 Gastrointestinal hemorrhage, unspecified: Secondary | ICD-10-CM | POA: Diagnosis not present

## 2022-11-11 DIAGNOSIS — Z87442 Personal history of urinary calculi: Secondary | ICD-10-CM

## 2022-11-11 DIAGNOSIS — K5900 Constipation, unspecified: Secondary | ICD-10-CM | POA: Diagnosis not present

## 2022-11-11 DIAGNOSIS — Z801 Family history of malignant neoplasm of trachea, bronchus and lung: Secondary | ICD-10-CM

## 2022-11-11 DIAGNOSIS — K219 Gastro-esophageal reflux disease without esophagitis: Secondary | ICD-10-CM | POA: Diagnosis present

## 2022-11-11 DIAGNOSIS — D122 Benign neoplasm of ascending colon: Secondary | ICD-10-CM | POA: Diagnosis not present

## 2022-11-11 DIAGNOSIS — N401 Enlarged prostate with lower urinary tract symptoms: Secondary | ICD-10-CM | POA: Diagnosis present

## 2022-11-11 DIAGNOSIS — Z6824 Body mass index (BMI) 24.0-24.9, adult: Secondary | ICD-10-CM

## 2022-11-11 DIAGNOSIS — I712 Thoracic aortic aneurysm, without rupture, unspecified: Secondary | ICD-10-CM | POA: Diagnosis present

## 2022-11-11 DIAGNOSIS — K5730 Diverticulosis of large intestine without perforation or abscess without bleeding: Secondary | ICD-10-CM | POA: Diagnosis not present

## 2022-11-11 DIAGNOSIS — D649 Anemia, unspecified: Secondary | ICD-10-CM | POA: Diagnosis present

## 2022-11-11 LAB — CBC WITH DIFFERENTIAL/PLATELET
Abs Immature Granulocytes: 0.02 10*3/uL (ref 0.00–0.07)
Basophils Absolute: 0.1 10*3/uL (ref 0.0–0.1)
Basophils Relative: 1 %
Eosinophils Absolute: 0.2 10*3/uL (ref 0.0–0.5)
Eosinophils Relative: 3 %
HCT: 37.1 % — ABNORMAL LOW (ref 39.0–52.0)
Hemoglobin: 11.8 g/dL — ABNORMAL LOW (ref 13.0–17.0)
Immature Granulocytes: 0 %
Lymphocytes Relative: 21 %
Lymphs Abs: 1.8 10*3/uL (ref 0.7–4.0)
MCH: 31.3 pg (ref 26.0–34.0)
MCHC: 31.8 g/dL (ref 30.0–36.0)
MCV: 98.4 fL (ref 80.0–100.0)
Monocytes Absolute: 0.6 10*3/uL (ref 0.1–1.0)
Monocytes Relative: 7 %
Neutro Abs: 5.9 10*3/uL (ref 1.7–7.7)
Neutrophils Relative %: 68 %
Platelets: 242 10*3/uL (ref 150–400)
RBC: 3.77 MIL/uL — ABNORMAL LOW (ref 4.22–5.81)
RDW: 11.9 % (ref 11.5–15.5)
WBC: 8.5 10*3/uL (ref 4.0–10.5)
nRBC: 0 % (ref 0.0–0.2)

## 2022-11-11 LAB — COMPREHENSIVE METABOLIC PANEL
ALT: 14 U/L (ref 0–44)
AST: 17 U/L (ref 15–41)
Albumin: 3.3 g/dL — ABNORMAL LOW (ref 3.5–5.0)
Alkaline Phosphatase: 52 U/L (ref 38–126)
Anion gap: 7 (ref 5–15)
BUN: 30 mg/dL — ABNORMAL HIGH (ref 8–23)
CO2: 26 mmol/L (ref 22–32)
Calcium: 8.7 mg/dL — ABNORMAL LOW (ref 8.9–10.3)
Chloride: 107 mmol/L (ref 98–111)
Creatinine, Ser: 1.75 mg/dL — ABNORMAL HIGH (ref 0.61–1.24)
GFR, Estimated: 36 mL/min — ABNORMAL LOW (ref >60)
Glucose, Bld: 99 mg/dL (ref 70–99)
Potassium: 4.2 mmol/L (ref 3.5–5.1)
Sodium: 140 mmol/L (ref 135–145)
Total Bilirubin: 1.2 mg/dL (ref 0.3–1.2)
Total Protein: 5.9 g/dL — ABNORMAL LOW (ref 6.5–8.1)

## 2022-11-11 LAB — TYPE AND SCREEN
ABO/RH(D): O NEG
Antibody Screen: NEGATIVE

## 2022-11-11 LAB — POC OCCULT BLOOD, ED: Fecal Occult Bld: POSITIVE — AB

## 2022-11-11 MED ORDER — PEG-KCL-NACL-NASULF-NA ASC-C 100 G PO SOLR
0.5000 | Freq: Once | ORAL | Status: AC
  Administered 2022-11-11: 100 g via ORAL

## 2022-11-11 MED ORDER — ONDANSETRON HCL 4 MG PO TABS: 4.0000 mg | ORAL_TABLET | Freq: Four times a day (QID) | ORAL | Status: DC | PRN

## 2022-11-11 MED ORDER — BISACODYL 5 MG PO TBEC
10.0000 mg | DELAYED_RELEASE_TABLET | Freq: Once | ORAL | Status: AC
  Administered 2022-11-11: 10 mg via ORAL
  Filled 2022-11-11: qty 2

## 2022-11-11 MED ORDER — TAMSULOSIN HCL 0.4 MG PO CAPS
0.4000 mg | ORAL_CAPSULE | Freq: Every day | ORAL | Status: DC
  Administered 2022-11-11 – 2022-11-12 (×2): 0.4 mg via ORAL
  Filled 2022-11-11 (×2): qty 1

## 2022-11-11 MED ORDER — PEG-KCL-NACL-NASULF-NA ASC-C 100 G PO SOLR
1.0000 | Freq: Once | ORAL | Status: DC
  Administered 2022-11-11: 200 g via ORAL

## 2022-11-11 MED ORDER — IOHEXOL 300 MG/ML  SOLN
100.0000 mL | Freq: Once | INTRAMUSCULAR | Status: AC | PRN
  Administered 2022-11-11: 75 mL via INTRAVENOUS

## 2022-11-11 MED ORDER — PEG-KCL-NACL-NASULF-NA ASC-C 100 G PO SOLR
0.5000 | Freq: Once | ORAL | Status: AC
  Filled 2022-11-11: qty 1

## 2022-11-11 MED ORDER — PANTOPRAZOLE SODIUM 40 MG IV SOLR
40.0000 mg | Freq: Once | INTRAVENOUS | Status: AC
  Administered 2022-11-11: 40 mg via INTRAVENOUS
  Filled 2022-11-11: qty 10

## 2022-11-11 MED ORDER — ACETAMINOPHEN 650 MG RE SUPP: 650.0000 mg | Freq: Four times a day (QID) | RECTAL | Status: DC | PRN

## 2022-11-11 MED ORDER — BOOST / RESOURCE BREEZE PO LIQD CUSTOM
1.0000 | Freq: Three times a day (TID) | ORAL | Status: DC
  Administered 2022-11-12 (×2): 1 via ORAL

## 2022-11-11 MED ORDER — ONDANSETRON HCL 4 MG/2ML IJ SOLN: 4.0000 mg | Freq: Four times a day (QID) | INTRAMUSCULAR | Status: DC | PRN

## 2022-11-11 MED ORDER — ACETAMINOPHEN 325 MG PO TABS: 650.0000 mg | ORAL_TABLET | Freq: Four times a day (QID) | ORAL | Status: DC | PRN

## 2022-11-11 MED ORDER — ROSUVASTATIN CALCIUM 10 MG PO TABS
10.0000 mg | ORAL_TABLET | Freq: Every day | ORAL | Status: DC
  Administered 2022-11-11 – 2022-11-12 (×2): 10 mg via ORAL
  Filled 2022-11-11 (×2): qty 1

## 2022-11-11 MED ORDER — SODIUM CHLORIDE 0.9 % IV SOLN: INTRAVENOUS | Status: DC

## 2022-11-11 NOTE — H&P (Signed)
History and Physical    Patient: Stephen Wilkins NWG:[id number] DOB: 1927/12/02 DOA: 11/11/2022 DOS: the patient was seen and examined on 11/11/2022 PCP: Corwin Levins, MD  Patient coming from: Home  Chief Complaint:  Chief Complaint  Patient presents with   Rectal Bleeding   HPI: Stephen Wilkins is a 89 y.o. male with medical history significant of normal EKG, large rhinitis, satiety, dementia, COPD, asthmatic bronchitis, BPH, right hip bursitis, rectal dysfunction, esophageal stricture,  Schatzki's ring, n, GERD/hiatal hernia, hyperlipidemia, hypertension, nephrolithiasis, nonvesicular rash, urinary frequency, unspecified hypothyroidism who presented to the emergency department complaints of 2 episodes of hematochezia this morning with the last one being large-volume bright red blood, but also stated he has lost 20 pounds unintentionally in the last few months as well.  He gets frequently constipated.  Mild abdominal discomfort, but no significant abdominal pain, nausea, emesis or diarrhea.  He gets nocturia several times at night, but no flank pain, dysuria or hematuria. He denied fever, chills, rhinorrhea, sore throat, wheezing or hemoptysis.  No chest pain, palpitations, diaphoresis, PND, orthopnea or pitting edema of the lower extremities.  No polyuria, polydipsia, polyphagia or blurred vision.   Lab work: Fecal occult blood was positive.  CBC showed a white count of 8.5, hemoglobin 11.8 g/dL and platelets 578.  CMP showed an albumin of 3.3 g/dL and total protein 5.9 g/dL.  BUN was 30 and creatinine 1.75 mg/dL.  The rest of the hepatic functions, glucose and electrolytes are normal after calcium was corrected.  Imaging: CT abdomen/pelvis with contrast no bowel obstruction, free air or free fluid.  Normal appendix.  Scattered bowel diverticula, gallstone.  Bilateral renal atrophy with a benign cyst and nonobstructing renal stones.  Diffuse atherosclerotic changes identified including areas of irregular  soft plaque along the abdominal aorta.   ED course: Initial vital signs were temperature 97.6 F, pulse 92, respirations 17, BP 152/97 mmHg O2 sat 95% on room air.  Patient received pantoprazole 40 mg IVP while in the emergency department.  Review of Systems: As mentioned in the history of present illness. All other systems reviewed and are negative.  Past Medical History:  Diagnosis Date   ABNORMAL ELECTROCARDIOGRAM 06/21/2007   ALLERGIC RHINITIS 03/23/2007   Anxiety state, unspecified 09/06/2013   ASTHMATIC BRONCHITIS, ACUTE 04/27/2007   BENIGN PROSTATIC HYPERTROPHY 03/23/2007   BRONCHITIS NOT SPECIFIED AS ACUTE OR CHRONIC 04/21/2007   BURSITIS, RIGHT HIP 07/31/2008   COPD (chronic obstructive pulmonary disease) (HCC) 04/19/2016   Cough 01/29/2009   Dementia (HCC) 09/01/2010   ERECTILE DYSFUNCTION 03/23/2007   ERECTILE DYSFUNCTION, ORGANIC 04/17/2009   Esophageal stricture    FREQUENCY, URINARY 04/26/2010   GERD 03/23/2007   HIATAL HERNIA    HYPERLIPIDEMIA 03/23/2007   HYPERTENSION 03/23/2007   MILD COGNITIVE IMPAIRMENT SO STATED 05/19/2010   NEPHROLITHIASIS, HX OF 03/23/2007   PSA, INCREASED 06/27/2008   RASH-NONVESICULAR 03/23/2007   REACTIVE AIRWAY DISEASE 01/14/2010   SCHATZKI'S RING    SCIATICA, RIGHT 04/28/2008   Unspecified hypothyroidism 09/06/2013   Wheezing 04/17/2009   Past Surgical History:  Procedure Laterality Date   ERCP N/A 12/28/2018   Procedure: ENDOSCOPIC RETROGRADE CHOLANGIOPANCREATOGRAPHY (ERCP);  Surgeon: Meryl Dare, MD;  Location: [facility];  Service: Endoscopy;  Laterality: N/A;   REMOVAL OF STONES  12/28/2018   Procedure: REMOVAL OF STONES;  Surgeon: Meryl Dare, MD;  Location: [facility];  Service: Endoscopy;;  balloon sweep   SPHINCTEROTOMY  12/28/2018   Procedure: Dennison Mascot;  Surgeon: Claudette Head  T, MD;  Location: [facility];  Service: Endoscopy;;   TONSILLECTOMY     Social History:  reports that he has never smoked. He has never used  smokeless tobacco. He reports that he does not drink alcohol and does not use drugs.  Allergies  Allergen Reactions   Atorvastatin     REACTION: myalgia   Doxazosin Mesylate     REACTION: dizziness   Hydrocodone Bit-Homatrop Mbr Other (See Comments)    anxiety   Hydrocodone Anxiety    Family History  Problem Relation Age of Onset   Lung cancer Brother        smoked   Cancer Other        lung cancer   Hypertension Other    Emphysema Brother        smoked   Asthma Sister     Prior to Admission medications   Medication Sig Start Date End Date Taking? Authorizing Provider  albuterol (PROVENTIL) (2.5 MG/3ML) 0.083% nebulizer solution Take 3 mLs (2.5 mg total) by nebulization every 6 (six) hours as needed for wheezing or shortness of breath. 06/02/22  Yes Corwin Levins, MD  albuterol (VENTOLIN HFA) 108 (90 Base) MCG/ACT inhaler INHALE 2 PUFFS BY MOUTH EVERY 6 HOURS AS NEEDED FOR WHEEZING FOR SHORTNESS OF BREATH Patient taking differently: Inhale 2 puffs into the lungs every 6 (six) hours as needed for shortness of breath. 08/12/22  Yes Corwin Levins, MD  ALPRAZolam Prudy Feeler) 0.5 MG tablet 1/2 - 1 tab by mouth twice per day as needed 09/22/20  Yes Corwin Levins, MD  aspirin EC 81 MG tablet Take by mouth. 02/10/16  Yes [provider]  budesonide-formoterol (SYMBICORT) 80-4.5 MCG/ACT inhaler Inhale 2 puffs into the lungs daily as needed (shortness of breath). 05/09/17  Yes [provider]  Cholecalciferol (VITAMIN D) 1000 UNITS capsule Take 1,000 Units by mouth every other day.    Yes [provider]  Cholecalciferol 50 MCG (2000 UT) TABS 1 tab by mouth once daily Patient taking differently: Take 1 tablet by mouth daily. 04/16/21  Yes Corwin Levins, MD  cyanocobalamin 50 MCG tablet Take 100 mcg by mouth daily. 03/02/22  Yes [provider]  desoximetasone (TOPICORT) 0.25 % cream APPLY SPARINGLY TO THE AFFECTED AREA TWICE DAILY AS NEEDED AS DIRECTED Patient  taking differently: Apply 1 application  topically 2 (two) times daily. Itching 04/30/18  Yes Corwin Levins, MD  methylPREDNISolone (MEDROL DOSEPAK) 4 MG TBPK tablet Take 24 mg on day 1, 20 mg on day 2, 16 mg on day 3, 12 mg on day 4, 8 mg on day 5, 4 mg on day 6.  Take all tablets in each row at once, do not spread tablets out throughout the day. 10/29/21  Yes Theadora Rama Scales, PA-C  montelukast (SINGULAIR) 10 MG tablet Take 1 tablet (10 mg total) by mouth at bedtime. 10/03/22  Yes Corwin Levins, MD  Multiple Vitamin (MULTIVITAMINS PO) Take 1 tablet by mouth daily. 03/02/22  Yes [provider]  Multiple Vitamins-Minerals (CENTRUM SILVER PO) Take 1 tablet by mouth daily.    Yes [provider]  polyethylene glycol powder (MIRALAX) powder Take 1/2 capful by mouth once daily Patient taking differently: Take 17 g by mouth 2 (two) times a week. 01/28/15  Yes Corwin Levins, MD  predniSONE (DELTASONE) 10 MG tablet 3 tabs by mouth per day for 3 days,2tabs per day for 3 days,1tab per day for 3 days 10/03/22  Yes Corwin Levins, MD  rosuvastatin (CRESTOR) 10 MG tablet Take 1 tablet by mouth once daily 08/11/22  Yes Corwin Levins, MD  sodium chloride (MURO 128) 5 % ophthalmic solution Place 1 drop into both eyes 4 (four) times daily as needed for eye irritation. 05/24/22  Yes [provider]  solifenacin (VESICARE) 5 MG tablet Take 1 tablet (5 mg total) by mouth daily. 04/25/22  Yes Corwin Levins, MD  tamsulosin (FLOMAX) 0.4 MG CAPS capsule TAKE 1 CAPSULE(0.4 MG) BY MOUTH AT BEDTIME Patient taking differently: Take 0.4 mg by mouth at bedtime. 04/25/22  Yes Corwin Levins, MD  vitamin B-12 (CYANOCOBALAMIN) 100 MCG tablet Take 100 mcg by mouth once a week.   Yes [provider]  vitamin C (ASCORBIC ACID) 500 MG tablet Take 1,000 mg by mouth daily.    Yes [provider]  cetirizine (ZYRTEC) 10 MG tablet Take 1 tablet (10 mg total) by mouth daily. Patient not taking:  Reported on 11/11/2022 12/17/21 12/17/22  Corwin Levins, MD  fluticasone [facility] HFA) 220 MCG/ACT inhaler Inhale 2 puffs into the lungs in the morning and at bedtime. Patient not taking: Reported on 11/11/2022 10/03/22   Corwin Levins, MD  Fluticasone Propionate, Inhal, 250 MCG/ACT AEPB Inhale 1 puff into the lungs 2 (two) times daily. Patient not taking: Reported on 11/11/2022 10/29/21 10/24/22  Theadora Rama Scales, PA-C  lansoprazole (PREVACID) 30 MG capsule TAKE 1 CAPSULE BY MOUTH ONCE DAILY AT  12  NOON Patient not taking: Reported on 11/11/2022 02/22/21   Corwin Levins, MD  levothyroxine (SYNTHROID) 112 MCG tablet Take 1 tablet by mouth once daily Patient not taking: Reported on 11/11/2022 07/07/21   Corwin Levins, MD  lidocaine (LIDODERM) 5 % Place 1 patch onto the skin daily. Remove & Discard patch within 12 hours or as directed by MD Patient not taking: Reported on 11/11/2022 06/09/22   Charlynne Pander, MD  losartan (COZAAR) 100 MG tablet Take 0.5 tablets (50 mg total) by mouth daily. Patient not taking: Reported on 11/11/2022 06/28/22   Corwin Levins, MD  RSV vaccine recomb adjuvanted (AREXVY) 120 MCG/0.5ML injection Inject into the muscle. 03/09/22   Judyann Munson, MD    Physical Exam: Vitals:   11/11/22 1022 11/11/22 1026 11/11/22 1215  BP:  (!) 152/97 (!) 166/94  Pulse:  92 88  Resp:  17 (!) 21  Temp:  97.6 F (36.4 C)   TempSrc:  Oral   SpO2:  95% 95%  Weight: 80.9 kg    Height: 6' (1.829 m)     Physical Exam Vitals and nursing note reviewed.  Constitutional:      Appearance: Normal appearance.  HENT:     Head: Normocephalic.     Nose: No rhinorrhea.     Mouth/Throat:     Mouth: Mucous membranes are moist.  Eyes:     General: No scleral icterus.    Pupils: Pupils are equal, round, and reactive to light.  Cardiovascular:     Rate and Rhythm: Normal rate and regular rhythm.  Pulmonary:     Effort: Pulmonary effort is normal.     Breath sounds: Normal breath sounds.  Abdominal:      General: Bowel sounds are normal. There is no distension.     Palpations: Abdomen is soft.  Musculoskeletal:     Cervical back: Neck supple.     Right lower leg: No edema.     Left lower leg:  No edema.  Skin:    General: Skin is warm and dry.  Neurological:     General: No focal deficit present.     Mental Status: He is alert and oriented to person, place, and time.  Psychiatric:        Mood and Affect: Mood normal.        Behavior: Behavior normal.     Data Reviewed:  Results are pending, will review when available.  Assessment and Plan: Principal Problem:   Acute on chronic anemia In the setting of:   Lower GI bleed Observation/PCU. Clear liquid diet. N.p.o. after midnight. Analgesics as needed. Antiemetics as needed. Pantoprazole 40 mg IVP given. Monitor H&H. Transfuse as needed. GI consult appreciated. -Will follow the recommendations. -If bleeding recurs obtain CTA GI bleed. -If positive consult interventional radiology. -Colonoscopy tomorrow.  Active Problems:   Unintentional weight loss Associated with:   Chronic constipation Given hematochezia and these symptoms Colonoscopy tomorrow to rule out malignancy    Hypothyroidism   Abnormal TSH Not taking levothyroxine. Will check his TSH level.    Hyperlipidemia/aortic atherosclerosis Continue rosuvastatin 10 mg p.o. daily.    GERD Pantoprazole as above.    CKD (chronic kidney disease) Monitor renal function/electrolytes.    COPD (chronic obstructive pulmonary disease) (HCC) Bronchodilators as needed.    HTN (hypertension) Monitor blood pressure. Currently not on therapy. As needed low-dose antihypertensives.      Advance Care Planning:   Code Status: DNR   Consults: [facility] Corliss Parish, MD)  Family Communication: His wife was at bedside.  Severity of Illness: The appropriate patient status for this patient is OBSERVATION. Observation status is judged to be reasonable and  necessary in order to provide the required intensity of service to ensure the patient's safety. The patient's presenting symptoms, physical exam findings, and initial radiographic and laboratory data in the context of their medical condition is felt to place them at decreased risk for further clinical deterioration. Furthermore, it is anticipated that the patient will be medically stable for discharge from the hospital within 2 midnights of admission.   Author: Bobette Mo, MD 11/11/2022 2:54 PM  For on call review [email or web address].   This document was prepared using Dragon voice recognition software and may contain some unintended transcription errors.

## 2022-11-11 NOTE — ED Notes (Signed)
ED TO INPATIENT HANDOFF REPORT  ED Nurse Name and Phone #: Richarda Osmond Name/Age/Gender Stephen Wilkins 89 y.o. male Room/Bed: WA12/WA12  Code Status   Code Status: Prior  Home/SNF/Other Home Patient oriented to: self, place, time, and situation Is this baseline? Yes   Triage Complete: Triage complete  Chief Complaint Lower GI bleed [K92.2]  Triage Note Pt reports bright red blood in stool this morning. Denies pain    Allergies Allergies  Allergen Reactions   Atorvastatin     REACTION: myalgia   Doxazosin Mesylate     REACTION: dizziness   Hydrocodone Bit-Homatrop Mbr Other (See Comments)    anxiety   Hydrocodone Anxiety    Level of Care/Admitting Diagnosis ED Disposition     ED Disposition  Admit   Condition  --   Comment  Hospital Area: [facility] [[id number]]  Level of Care: Progressive [102]  Admit to Progressive based on following criteria: GI, ENDOCRINE disease patients with GI bleeding, acute liver failure or pancreatitis, stable with diabetic ketoacidosis or thyrotoxicosis (hypothyroid) state.  May place patient in observation at [facility] or [facility] if equivalent level of care is available:: No  Covid Evaluation: Asymptomatic - no recent exposure (last 10 days) testing not required  Diagnosis: Lower GI bleed [638756]  Admitting Physician: Bobette Mo [[id number]]  Attending Physician: Bobette Mo [[id number]]          B Medical/Surgery History Past Medical History:  Diagnosis Date   ABNORMAL ELECTROCARDIOGRAM 06/21/2007   ALLERGIC RHINITIS 03/23/2007   Anxiety state, unspecified 09/06/2013   ASTHMATIC BRONCHITIS, ACUTE 04/27/2007   BENIGN PROSTATIC HYPERTROPHY 03/23/2007   BRONCHITIS NOT SPECIFIED AS ACUTE OR CHRONIC 04/21/2007   BURSITIS, RIGHT HIP 07/31/2008   COPD (chronic obstructive pulmonary disease) (HCC) 04/19/2016   Cough 01/29/2009   Dementia (HCC) 09/01/2010   ERECTILE DYSFUNCTION 03/23/2007   ERECTILE  DYSFUNCTION, ORGANIC 04/17/2009   Esophageal stricture    FREQUENCY, URINARY 04/26/2010   GERD 03/23/2007   HIATAL HERNIA    HYPERLIPIDEMIA 03/23/2007   HYPERTENSION 03/23/2007   MILD COGNITIVE IMPAIRMENT SO STATED 05/19/2010   NEPHROLITHIASIS, HX OF 03/23/2007   PSA, INCREASED 06/27/2008   RASH-NONVESICULAR 03/23/2007   REACTIVE AIRWAY DISEASE 01/14/2010   SCHATZKI'S RING    SCIATICA, RIGHT 04/28/2008   Unspecified hypothyroidism 09/06/2013   Wheezing 04/17/2009   Past Surgical History:  Procedure Laterality Date   ERCP N/A 12/28/2018   Procedure: ENDOSCOPIC RETROGRADE CHOLANGIOPANCREATOGRAPHY (ERCP);  Surgeon: Meryl Dare, MD;  Location: [facility];  Service: Endoscopy;  Laterality: N/A;   REMOVAL OF STONES  12/28/2018   Procedure: REMOVAL OF STONES;  Surgeon: Meryl Dare, MD;  Location: [facility];  Service: Endoscopy;;  balloon sweep   SPHINCTEROTOMY  12/28/2018   Procedure: SPHINCTEROTOMY;  Surgeon: Meryl Dare, MD;  Location: [facility];  Service: Endoscopy;;   TONSILLECTOMY       A IV Location/Drains/Wounds Patient Lines/Drains/Airways Status     Active Line/Drains/Airways     Name Placement date Placement time Site Days   Peripheral IV 11/11/22 Anterior;Left Forearm 11/11/22  1044  Forearm  less than 1   Wound / Incision (Open or Dehisced) 12/01/16 Other (Comment) Knee Left 12/01/16  0549  Knee  2171            Intake/Output Last 24 hours No intake or output data in the 24 hours ending 11/11/22 1536  Labs/Imaging Results for orders placed or performed during the hospital encounter  of 11/11/22 (from the past 48 hour(s))  Type and screen [facility]     Status: None   Collection Time: 11/11/22 10:47 AM  Result Value Ref Range   ABO/RH(D) O NEG    Antibody Screen NEG    Sample Expiration      11/14/2022,2359 Performed at [facility], [address], [place], [place] [address]   CBC with  Differential/Platelet     Status: Abnormal   Collection Time: 11/11/22 10:47 AM  Result Value Ref Range   WBC 8.5 4.0 - 10.5 K/uL   RBC 3.77 (L) 4.22 - 5.81 MIL/uL   Hemoglobin 11.8 (L) 13.0 - 17.0 g/dL   HCT 60.4 (L) 54.0 - 98.1 %   MCV 98.4 80.0 - 100.0 fL   MCH 31.3 26.0 - 34.0 pg   MCHC 31.8 30.0 - 36.0 g/dL   RDW 19.1 47.8 - 29.5 %   Platelets 242 150 - 400 K/uL   nRBC 0.0 0.0 - 0.2 %   Neutrophils Relative % 68 %   Neutro Abs 5.9 1.7 - 7.7 K/uL   Lymphocytes Relative 21 %   Lymphs Abs 1.8 0.7 - 4.0 K/uL   Monocytes Relative 7 %   Monocytes Absolute 0.6 0.1 - 1.0 K/uL   Eosinophils Relative 3 %   Eosinophils Absolute 0.2 0.0 - 0.5 K/uL   Basophils Relative 1 %   Basophils Absolute 0.1 0.0 - 0.1 K/uL   Immature Granulocytes 0 %   Abs Immature Granulocytes 0.02 0.00 - 0.07 K/uL    Comment: Performed at [facility], [address] [place], [place] [address]  Comprehensive metabolic panel     Status: Abnormal   Collection Time: 11/11/22 10:47 AM  Result Value Ref Range   Sodium 140 135 - 145 mmol/L   Potassium 4.2 3.5 - 5.1 mmol/L   Chloride 107 98 - 111 mmol/L   CO2 26 22 - 32 mmol/L   Glucose, Bld 99 70 - 99 mg/dL    Comment: Glucose reference range applies only to samples taken after fasting for at least 8 hours.   BUN 30 (H) 8 - 23 mg/dL   Creatinine, Ser 8.65 (H) 0.61 - 1.24 mg/dL   Calcium 8.7 (L) 8.9 - 10.3 mg/dL   Total Protein 5.9 (L) 6.5 - 8.1 g/dL   Albumin 3.3 (L) 3.5 - 5.0 g/dL   AST 17 15 - 41 U/L   ALT 14 0 - 44 U/L   Alkaline Phosphatase 52 38 - 126 U/L   Total Bilirubin 1.2 0.3 - 1.2 mg/dL   GFR, Estimated 36 (L) >60 mL/min    Comment: (NOTE) Calculated using the CKD-EPI Creatinine Equation (2021)    Anion gap 7 5 - 15    Comment: Performed at [facility], [address], [place], [place] [address]  POC occult blood, ED Provider will collect     Status: Abnormal   Collection Time: 11/11/22 11:44 AM  Result  Value Ref Range   Fecal Occult Bld POSITIVE (A) NEGATIVE   CT ABDOMEN PELVIS W CONTRAST  Result Date: 11/11/2022 CLINICAL DATA:  Abdominal pain.  Blood per rectum. EXAM: CT ABDOMEN AND PELVIS WITH CONTRAST TECHNIQUE: Multidetector CT imaging of the abdomen and pelvis was performed using the standard protocol following bolus administration of intravenous contrast. RADIATION DOSE REDUCTION: This exam was performed according to the departmental dose-optimization program which includes automated exposure control, adjustment of the mA and/or kV  according to patient size and/or use of iterative reconstruction technique. CONTRAST:  75mL OMNIPAQUE IOHEXOL 300 MG/ML  SOLN COMPARISON:  CT 12/27/2018 abdomen pelvis FINDINGS: Lower chest: There is some linear opacity lung bases likely scar or atelectasis. No pleural effusion. Stable tiny 3 mm nodule medial right lung base on series 6, image 26 demonstrating long-term stability. No specific imaging follow-up. Mild lung base bronchial wall thickening. Hepatobiliary: Layering stones within the gallbladder. Gallbladder itself is nondilated. There is a benign-appearing cysts seen centrally along the liver on series 2, image 22 measuring 15 mm. Unchanged from prior demonstrating long-term stability. Patent portal vein. Pancreas: There is some pancreatic atrophy identified. There are several punctate calcifications in the head/uncinate process of the pancreas. Please correlate for any history such as chronic calcific pancreatitis. These were seen previously. No obvious pancreatic mass. Spleen: Normal in size without focal abnormality. Adrenals/Urinary Tract: Adrenal glands are preserved. There is moderate atrophy of the kidneys. Exophytic Bosniak 1 right-sided renal cysts identified measuring up to 4.1 cm in diameter with Hounsfield unit 9. There are some smaller Bosniak 1 and 2 lesions elsewhere bilaterally. There are punctate bilateral nonobstructing renal stones. The ureters  have normal course and caliber extending down to the bladder. Preserved contours of the urinary bladder. Stomach/Bowel: Moderate colonic stool. Large bowel is of normal course and caliber. Left-sided colonic diverticula. Normal appendix. Stomach is underdistended. Small bowel is nondilated. Few small bowel diverticula identified along the ileum. Vascular/Lymphatic: Diffuse partially calcified atherosclerotic plaque. There are several areas of irregular soft plaque along the abdominal aorta. Dilatation of the common iliac arteries on the right measuring up to 17 mm and left 14 mm. Areas of stenosis as well along the internal iliac vessels. No specific abnormal lymph node enlargement identified in the abdomen and pelvis. Reproductive: Prominent prostate. Other: No free air or free fluid. Musculoskeletal: Curvature and degenerative changes along the spine. IMPRESSION: No bowel obstruction, free air or free fluid. Normal appendix. Moderate colonic stool. Scattered bowel diverticula. Gallstones. Bilateral renal atrophy with a benign cysts and nonobstructing renal stones. Diffuse atherosclerotic changes identified including areas of irregular soft plaque along the abdominal aorta. Electronically Signed   By: Karen Kays M.D.   On: 11/11/2022 14:10    Pending Labs Unresulted Labs (From admission, onward)    None       Vitals/Pain Today's Vitals   11/11/22 1022 11/11/22 1026 11/11/22 1215 11/11/22 1512  BP:  (!) 152/97 (!) 166/94   Pulse:  92 88   Resp:  17 (!) 21   Temp:  97.6 F (36.4 C)  98.3 F (36.8 C)  TempSrc:  Oral  Oral  SpO2:  95% 95%   Weight: 80.9 kg     Height: 6' (1.829 m)     PainSc: 0-No pain       Isolation Precautions No active isolations  Medications Medications  iohexol (OMNIPAQUE) 300 MG/ML solution 100 mL (75 mLs Intravenous Contrast Given 11/11/22 1229)    Mobility walks with device     Focused Assessments    R Recommendations: See Admitting Provider  Note  Report given to:   Additional Notes:

## 2022-11-11 NOTE — Consult Note (Signed)
Consultation  Referring Provider:   Dr. Deretha Emory Primary Care Physician:  Corwin Levins, MD Primary Gastroenterologist:  Previously Dr. Juanda Chance       Reason for Consultation: Rectal bleeding  DOA: 11/11/2022         Hospital Day: 1         HPI:   Stephen Wilkins is a 89 y.o. male with past medical history significant for thoracic AA, severe persistent asthma COPD overlap with history of acute hypoxic respiratory failure, GERD with history of dysphagia and Schatzki ring, choledocholithiasis status post ERCP in 2020, CKD, dementia.  2006 remote colonoscopy with Dr. Juanda Chance good prep unremarkable 2008 EGD 1 CM HH, s/p dilation benign stricture Presents to the ER with 2 episodes of hematochezia with abdominal discomfort.  Work up notable for stable HD. No leukocytosis, Hgb 11.8 baseline appears to be around 12, MCV 98.4 normal, platelets normal.  Grossly positive rectal exam in the ER. Creatinine 1.75, BUN 30 appears to be around baseline, no elevation of BUN.  GFR 36.  Unremarkable LFTs with an albumin 3.3 CT abdomen pelvis with contrast shows no bowel obstruction, moderate colonic stool, scattered bowel diverticula left-sided, few small bowel diverticula in the ileum, no diverticulitis noted.  Gallstones with benign-appearing cyst on the liver, gallbladder nondilated.  Does not mention biliary ducts.  Pancreatic atrophy no obvious mass.  Normal spleen  Patient with family at bedside, Stephen Wilkins his wife. Provided some of the history.  He has never had anything like this happen before.  He is not on blood thinners, he is not on NSAIDS. Has felt sluggish with chills but no fever in last 1-2 months.  He has lost about 20 lb in last 6-9 months.  He has good appetite, no GERD, no dysphagia.  He has had constipation for ever, he has to take stool softeners, occ laxative, no changes in bowel movement.  Denies AB pain unless some one pushes on his AB, he had recent CT at the [place] due to examination  with AB tenderness but after reviewing on wife's phone is was CTA aortogram and run off, it did show severe left diverticulosis descending and sigmoid with no diverticulitis, severe ulcerated aortoilliac arthrosclerosis with severe proximal celiac, SMA. He had 2 episodes this AM with small volume hard stool and large volume BRB in the toliet at 8 AM,no associated AB pain, no nausea, vomiting.  No diaphoresis, chest pain, SOB.  Remote history of smoking, no ETOH, no drug use.  He has had wheezing and SOB in March, has been having intermittent attacks since that time with prednisone and antibiotics, last time was 2 weeks ago. Has CT chest without contrast in March, last prednisone 2 months.   Abnormal ED labs: Abnormal Labs Reviewed  CBC WITH DIFFERENTIAL/PLATELET - Abnormal; Notable for the following components:      Result Value   RBC 3.77 (*)    Hemoglobin 11.8 (*)    HCT 37.1 (*)    All other components within normal limits  COMPREHENSIVE METABOLIC PANEL - Abnormal; Notable for the following components:   BUN 30 (*)    Creatinine, Ser 1.75 (*)    Calcium 8.7 (*)    Total Protein 5.9 (*)    Albumin 3.3 (*)    GFR, Estimated 36 (*)    All other components within normal limits  POC OCCULT BLOOD, ED - Abnormal; Notable for the following components:   Fecal Occult Bld POSITIVE (*)  All other components within normal limits    Past Medical History:  Diagnosis Date   ABNORMAL ELECTROCARDIOGRAM 06/21/2007   ALLERGIC RHINITIS 03/23/2007   Anxiety state, unspecified 09/06/2013   ASTHMATIC BRONCHITIS, ACUTE 04/27/2007   BENIGN PROSTATIC HYPERTROPHY 03/23/2007   BRONCHITIS NOT SPECIFIED AS ACUTE OR CHRONIC 04/21/2007   BURSITIS, RIGHT HIP 07/31/2008   COPD (chronic obstructive pulmonary disease) (HCC) 04/19/2016   Cough 01/29/2009   Dementia (HCC) 09/01/2010   ERECTILE DYSFUNCTION 03/23/2007   ERECTILE DYSFUNCTION, ORGANIC 04/17/2009   Esophageal stricture    FREQUENCY, URINARY 04/26/2010    GERD 03/23/2007   HIATAL HERNIA    HYPERLIPIDEMIA 03/23/2007   HYPERTENSION 03/23/2007   MILD COGNITIVE IMPAIRMENT SO STATED 05/19/2010   NEPHROLITHIASIS, HX OF 03/23/2007   PSA, INCREASED 06/27/2008   RASH-NONVESICULAR 03/23/2007   REACTIVE AIRWAY DISEASE 01/14/2010   SCHATZKI'S RING    SCIATICA, RIGHT 04/28/2008   Unspecified hypothyroidism 09/06/2013   Wheezing 04/17/2009    Surgical History:  He  has a past surgical history that includes Tonsillectomy; ERCP (N/A, 12/28/2018); sphincterotomy (12/28/2018); and removal of stones (12/28/2018). Family History:  His family history includes Asthma in his sister; Cancer in an other family member; Emphysema in his brother; Hypertension in an other family member; Lung cancer in his brother. Social History:   reports that he has never smoked. He has never used smokeless tobacco. He reports that he does not drink alcohol and does not use drugs.  Prior to Admission medications   Medication Sig Start Date End Date Taking? Authorizing Provider  albuterol (PROVENTIL) (2.5 MG/3ML) 0.083% nebulizer solution Take 3 mLs (2.5 mg total) by nebulization every 6 (six) hours as needed for wheezing or shortness of breath. 06/02/22  Yes Corwin Levins, MD  albuterol (VENTOLIN HFA) 108 (90 Base) MCG/ACT inhaler INHALE 2 PUFFS BY MOUTH EVERY 6 HOURS AS NEEDED FOR WHEEZING FOR SHORTNESS OF BREATH Patient taking differently: Inhale 2 puffs into the lungs every 6 (six) hours as needed for shortness of breath. 08/12/22  Yes Corwin Levins, MD  ALPRAZolam Prudy Feeler) 0.5 MG tablet 1/2 - 1 tab by mouth twice per day as needed 09/22/20  Yes Corwin Levins, MD  aspirin EC 81 MG tablet Take by mouth. 02/10/16  Yes [provider]  budesonide-formoterol (SYMBICORT) 80-4.5 MCG/ACT inhaler Inhale 2 puffs into the lungs daily as needed (shortness of breath). 05/09/17  Yes [provider]  Cholecalciferol (VITAMIN D) 1000 UNITS capsule Take 1,000 Units by mouth every other day.     Yes [provider]  Cholecalciferol 50 MCG (2000 UT) TABS 1 tab by mouth once daily Patient taking differently: Take 1 tablet by mouth daily. 04/16/21  Yes Corwin Levins, MD  cyanocobalamin 50 MCG tablet Take 100 mcg by mouth daily. 03/02/22  Yes [provider]  desoximetasone (TOPICORT) 0.25 % cream APPLY SPARINGLY TO THE AFFECTED AREA TWICE DAILY AS NEEDED AS DIRECTED Patient taking differently: Apply 1 application  topically 2 (two) times daily. Itching 04/30/18  Yes Corwin Levins, MD  methylPREDNISolone (MEDROL DOSEPAK) 4 MG TBPK tablet Take 24 mg on day 1, 20 mg on day 2, 16 mg on day 3, 12 mg on day 4, 8 mg on day 5, 4 mg on day 6.  Take all tablets in each row at once, do not spread tablets out throughout the day. 10/29/21  Yes Theadora Rama Scales, PA-C  montelukast (SINGULAIR) 10 MG tablet Take 1 tablet (10 mg total) by mouth  at bedtime. 10/03/22  Yes Corwin Levins, MD  Multiple Vitamin (MULTIVITAMINS PO) Take 1 tablet by mouth daily. 03/02/22  Yes [provider]  Multiple Vitamins-Minerals (CENTRUM SILVER PO) Take 1 tablet by mouth daily.    Yes [provider]  polyethylene glycol powder (MIRALAX) powder Take 1/2 capful by mouth once daily Patient taking differently: Take 17 g by mouth 2 (two) times a week. 01/28/15  Yes Corwin Levins, MD  predniSONE (DELTASONE) 10 MG tablet 3 tabs by mouth per day for 3 days,2tabs per day for 3 days,1tab per day for 3 days 10/03/22  Yes Corwin Levins, MD  rosuvastatin (CRESTOR) 10 MG tablet Take 1 tablet by mouth once daily 08/11/22  Yes Corwin Levins, MD  sodium chloride (MURO 128) 5 % ophthalmic solution Place 1 drop into both eyes 4 (four) times daily as needed for eye irritation. 05/24/22  Yes [provider]  solifenacin (VESICARE) 5 MG tablet Take 1 tablet (5 mg total) by mouth daily. 04/25/22  Yes Corwin Levins, MD  tamsulosin (FLOMAX) 0.4 MG CAPS capsule TAKE 1 CAPSULE(0.4 MG) BY MOUTH AT BEDTIME Patient  taking differently: Take 0.4 mg by mouth at bedtime. 04/25/22  Yes Corwin Levins, MD  vitamin B-12 (CYANOCOBALAMIN) 100 MCG tablet Take 100 mcg by mouth once a week.   Yes [provider]  vitamin C (ASCORBIC ACID) 500 MG tablet Take 1,000 mg by mouth daily.    Yes [provider]  cetirizine (ZYRTEC) 10 MG tablet Take 1 tablet (10 mg total) by mouth daily. Patient not taking: Reported on 11/11/2022 12/17/21 12/17/22  Corwin Levins, MD  fluticasone [facility] HFA) 220 MCG/ACT inhaler Inhale 2 puffs into the lungs in the morning and at bedtime. Patient not taking: Reported on 11/11/2022 10/03/22   Corwin Levins, MD  Fluticasone Propionate, Inhal, 250 MCG/ACT AEPB Inhale 1 puff into the lungs 2 (two) times daily. Patient not taking: Reported on 11/11/2022 10/29/21 10/24/22  Theadora Rama Scales, PA-C  lansoprazole (PREVACID) 30 MG capsule TAKE 1 CAPSULE BY MOUTH ONCE DAILY AT  12  NOON Patient not taking: Reported on 11/11/2022 02/22/21   Corwin Levins, MD  levothyroxine (SYNTHROID) 112 MCG tablet Take 1 tablet by mouth once daily Patient not taking: Reported on 11/11/2022 07/07/21   Corwin Levins, MD  lidocaine (LIDODERM) 5 % Place 1 patch onto the skin daily. Remove & Discard patch within 12 hours or as directed by MD Patient not taking: Reported on 11/11/2022 06/09/22   Charlynne Pander, MD  losartan (COZAAR) 100 MG tablet Take 0.5 tablets (50 mg total) by mouth daily. Patient not taking: Reported on 11/11/2022 06/28/22   Corwin Levins, MD  RSV vaccine recomb adjuvanted (AREXVY) 120 MCG/0.5ML injection Inject into the muscle. 03/09/22   Judyann Munson, MD    Current Facility-Administered Medications  Medication Dose Route Frequency Provider Last Rate Last Admin   albuterol (PROVENTIL) (2.5 MG/3ML) 0.083% nebulizer solution 2.5 mg  2.5 mg Nebulization Once Corwin Levins, MD       Current Outpatient Medications  Medication Sig Dispense Refill   albuterol (PROVENTIL) (2.5 MG/3ML) 0.083% nebulizer  solution Take 3 mLs (2.5 mg total) by nebulization every 6 (six) hours as needed for wheezing or shortness of breath. 150 mL 2   albuterol (VENTOLIN HFA) 108 (90 Base) MCG/ACT inhaler INHALE 2 PUFFS BY MOUTH EVERY 6 HOURS AS NEEDED FOR WHEEZING FOR SHORTNESS OF BREATH (Patient taking differently:  Inhale 2 puffs into the lungs every 6 (six) hours as needed for shortness of breath.) 9 g 0   ALPRAZolam (XANAX) 0.5 MG tablet 1/2 - 1 tab by mouth twice per day as needed 60 tablet 2   aspirin EC 81 MG tablet Take by mouth.     budesonide-formoterol (SYMBICORT) 80-4.5 MCG/ACT inhaler Inhale 2 puffs into the lungs daily as needed (shortness of breath).     Cholecalciferol (VITAMIN D) 1000 UNITS capsule Take 1,000 Units by mouth every other day.      Cholecalciferol 50 MCG (2000 UT) TABS 1 tab by mouth once daily (Patient taking differently: Take 1 tablet by mouth daily.) 30 tablet 99   cyanocobalamin 50 MCG tablet Take 100 mcg by mouth daily.     desoximetasone (TOPICORT) 0.25 % cream APPLY SPARINGLY TO THE AFFECTED AREA TWICE DAILY AS NEEDED AS DIRECTED (Patient taking differently: Apply 1 application  topically 2 (two) times daily. Itching) 30 g 1   methylPREDNISolone (MEDROL DOSEPAK) 4 MG TBPK tablet Take 24 mg on day 1, 20 mg on day 2, 16 mg on day 3, 12 mg on day 4, 8 mg on day 5, 4 mg on day 6.  Take all tablets in each row at once, do not spread tablets out throughout the day. 21 tablet 0   montelukast (SINGULAIR) 10 MG tablet Take 1 tablet (10 mg total) by mouth at bedtime. 30 tablet 3   Multiple Vitamin (MULTIVITAMINS PO) Take 1 tablet by mouth daily.     Multiple Vitamins-Minerals (CENTRUM SILVER PO) Take 1 tablet by mouth daily.      polyethylene glycol powder (MIRALAX) powder Take 1/2 capful by mouth once daily (Patient taking differently: Take 17 g by mouth 2 (two) times a week.) 850 g 5   predniSONE (DELTASONE) 10 MG tablet 3 tabs by mouth per day for 3 days,2tabs per day for 3 days,1tab per day  for 3 days 18 tablet 0   rosuvastatin (CRESTOR) 10 MG tablet Take 1 tablet by mouth once daily 90 tablet 3   sodium chloride (MURO 128) 5 % ophthalmic solution Place 1 drop into both eyes 4 (four) times daily as needed for eye irritation.     solifenacin (VESICARE) 5 MG tablet Take 1 tablet (5 mg total) by mouth daily. 90 tablet 3   tamsulosin (FLOMAX) 0.4 MG CAPS capsule TAKE 1 CAPSULE(0.4 MG) BY MOUTH AT BEDTIME (Patient taking differently: Take 0.4 mg by mouth at bedtime.) 90 capsule 3   vitamin B-12 (CYANOCOBALAMIN) 100 MCG tablet Take 100 mcg by mouth once a week.     vitamin C (ASCORBIC ACID) 500 MG tablet Take 1,000 mg by mouth daily.      cetirizine (ZYRTEC) 10 MG tablet Take 1 tablet (10 mg total) by mouth daily. (Patient not taking: Reported on 11/11/2022) 30 tablet 11   fluticasone (FLOVENT HFA) 220 MCG/ACT inhaler Inhale 2 puffs into the lungs in the morning and at bedtime. (Patient not taking: Reported on 11/11/2022) 1 each 12   Fluticasone Propionate, Inhal, 250 MCG/ACT AEPB Inhale 1 puff into the lungs 2 (two) times daily. (Patient not taking: Reported on 11/11/2022) 3 each 3   lansoprazole (PREVACID) 30 MG capsule TAKE 1 CAPSULE BY MOUTH ONCE DAILY AT  12  NOON (Patient not taking: Reported on 11/11/2022) 90 capsule 2   levothyroxine (SYNTHROID) 112 MCG tablet Take 1 tablet by mouth once daily (Patient not taking: Reported on 11/11/2022) 90 tablet 3   lidocaine (  LIDODERM) 5 % Place 1 patch onto the skin daily. Remove & Discard patch within 12 hours or as directed by MD (Patient not taking: Reported on 11/11/2022) 30 patch 0   losartan (COZAAR) 100 MG tablet Take 0.5 tablets (50 mg total) by mouth daily. (Patient not taking: Reported on 11/11/2022) 45 tablet 3   RSV vaccine recomb adjuvanted (AREXVY) 120 MCG/0.5ML injection Inject into the muscle. 0.5 mL 0    Allergies as of 11/11/2022 - Review Complete 11/11/2022  Allergen Reaction Noted   Atorvastatin  03/16/2009   Doxazosin mesylate   03/23/2007   Hydrocodone bit-homatrop mbr Other (See Comments) 04/19/2016   Hydrocodone Anxiety 03/02/2022    Review of Systems:    Constitutional: No weight loss, fever, chills, weakness or fatigue HEENT: Eyes: No change in vision               Ears, Nose, Throat:  No change in hearing or congestion Skin: No rash or itching Cardiovascular: No chest pain, chest pressure or palpitations   Respiratory: No SOB or cough Gastrointestinal: See HPI and otherwise negative Genitourinary: No dysuria or change in urinary frequency Neurological: No headache, dizziness or syncope Musculoskeletal: No new muscle or joint pain Hematologic: No bleeding or bruising Psychiatric: No history of depression or anxiety     Physical Exam:  Vital signs in last 24 hours: Temp:  [97.6 F (36.4 C)] 97.6 F (36.4 C) (08/09 1026) Pulse Rate:  [88-92] 88 (08/09 1215) Resp:  [17-21] 21 (08/09 1215) BP: (152-166)/(94-97) 166/94 (08/09 1215) SpO2:  [95 %] 95 % (08/09 1215) Weight:  [80.9 kg] 80.9 kg (08/09 1022)   Last BM recorded by nurses in past 5 days No data recorded  General:   Pleasant, well developed male in no acute distress Head:  Normocephalic and atraumatic. Eyes: sclerae anicteric,conjunctive pink  Heart:  regular rate and rhythm Pulm: Clear anteriorly; no wheezing Abdomen:  Soft, Obese AB, Sluggish bowel sounds. No tenderness . Without guarding and Without rebound, No organomegaly appreciated. Extremities:  Without edema. Msk:  Symmetrical without gross deformities. Peripheral pulses intact.  Neurologic:  Alert and  oriented x4;  No focal deficits.  Skin:   Dry and intact without significant lesions or rashes. Psychiatric:  Cooperative. Normal mood and affect.  LAB RESULTS: Recent Labs    11/08/22 1505 11/11/22 1047  WBC 8.3 8.5  HGB 12.7* 11.8*  HCT 39.8 37.1*  PLT 247.0 242   BMET Recent Labs    11/08/22 1505 11/11/22 1047  NA 140 140  K 4.2 4.2  CL 102 107  CO2 30 26   GLUCOSE 97 99  BUN 34* 30*  CREATININE 1.75* 1.75*  CALCIUM 9.4 8.7*   LFT Recent Labs    11/08/22 1505 11/11/22 1047  PROT 6.5 5.9*  ALBUMIN 3.9 3.3*  AST 19 17  ALT 11 14  ALKPHOS 54 52  BILITOT 0.8 1.2  BILIDIR 0.1  --    PT/INR No results for input(s): "LABPROT", "INR" in the last 72 hours.  STUDIES: CT ABDOMEN PELVIS W CONTRAST  Result Date: 11/11/2022 CLINICAL DATA:  Abdominal pain.  Blood per rectum. EXAM: CT ABDOMEN AND PELVIS WITH CONTRAST TECHNIQUE: Multidetector CT imaging of the abdomen and pelvis was performed using the standard protocol following bolus administration of intravenous contrast. RADIATION DOSE REDUCTION: This exam was performed according to the departmental dose-optimization program which includes automated exposure control, adjustment of the mA and/or kV according to patient size and/or use of iterative reconstruction  technique. CONTRAST:  75mL OMNIPAQUE IOHEXOL 300 MG/ML  SOLN COMPARISON:  CT 12/27/2018 abdomen pelvis FINDINGS: Lower chest: There is some linear opacity lung bases likely scar or atelectasis. No pleural effusion. Stable tiny 3 mm nodule medial right lung base on series 6, image 26 demonstrating long-term stability. No specific imaging follow-up. Mild lung base bronchial wall thickening. Hepatobiliary: Layering stones within the gallbladder. Gallbladder itself is nondilated. There is a benign-appearing cysts seen centrally along the liver on series 2, image 22 measuring 15 mm. Unchanged from prior demonstrating long-term stability. Patent portal vein. Pancreas: There is some pancreatic atrophy identified. There are several punctate calcifications in the head/uncinate process of the pancreas. Please correlate for any history such as chronic calcific pancreatitis. These were seen previously. No obvious pancreatic mass. Spleen: Normal in size without focal abnormality. Adrenals/Urinary Tract: Adrenal glands are preserved. There is moderate atrophy of  the kidneys. Exophytic Bosniak 1 right-sided renal cysts identified measuring up to 4.1 cm in diameter with Hounsfield unit 9. There are some smaller Bosniak 1 and 2 lesions elsewhere bilaterally. There are punctate bilateral nonobstructing renal stones. The ureters have normal course and caliber extending down to the bladder. Preserved contours of the urinary bladder. Stomach/Bowel: Moderate colonic stool. Large bowel is of normal course and caliber. Left-sided colonic diverticula. Normal appendix. Stomach is underdistended. Small bowel is nondilated. Few small bowel diverticula identified along the ileum. Vascular/Lymphatic: Diffuse partially calcified atherosclerotic plaque. There are several areas of irregular soft plaque along the abdominal aorta. Dilatation of the common iliac arteries on the right measuring up to 17 mm and left 14 mm. Areas of stenosis as well along the internal iliac vessels. No specific abnormal lymph node enlargement identified in the abdomen and pelvis. Reproductive: Prominent prostate. Other: No free air or free fluid. Musculoskeletal: Curvature and degenerative changes along the spine. IMPRESSION: No bowel obstruction, free air or free fluid. Normal appendix. Moderate colonic stool. Scattered bowel diverticula. Gallstones. Bilateral renal atrophy with a benign cysts and nonobstructing renal stones. Diffuse atherosclerotic changes identified including areas of irregular soft plaque along the abdominal aorta. Electronically Signed   By: Karen Kays M.D.   On: 11/11/2022 14:10      Impression    Hematochezia without hemodynamic compromise WBC 8.5 HGB 11.8 MCV 98.4 Platelets 242 11/11/2022 BUN 30 Cr 1.75  Stable HGB from 12.2 to 11.8 Patient has not had diverticular bleed in the past.  With clinical presentation of large painless hematochezia, most likely this is a diverticular bleed however his last colon was 2006, he has had 20 lb weight loss, fatigue and worsening constipation.  Need to consider possible malignancy.   Constipation Life long constipation, worse in the last several months.  CT with moderate colonic burden.   Weight loss 20 lbs in 6 months or so No changes in appetite Had CT without contrast 06/2022  COPD He is not on oxygen Has had some intermittent flares, last was    Active Problems:   * No active hospital problems. *    LOS: 0 days     Plan   - If patient has large volume rectal bleeding again, please get CTA and consult IR if positive.  --Continue to monitor H&H with transfusion as needed to maintain hemoglobin greater than 7. -Last colonoscopy was 2006 and unremarkable, however patient has had worsening constipation, weight loss and now this rectal bleeding.  I had a conversation with patient and his wife Stephen Wilkins, they are interested in pursuing colonoscopy.  With chronic constipation I am uncertain if would be able to prep him for tomorrow but patient is willing to try.  Will plan on colonoscopy tomorrow with Dr. Meridee Score, hopefully will be able to prep patient enough for completion We have discussed the risks of bleeding, infection, perforation, medication reactions, and remote risk of death associated with colonoscopy. All questions were answered and the patient acknowledges these risk and wishes to proceed. Liquid diet tonight, n.p.o. for a.m. Movie prep, Dulcolax this evening.   Thank you for your kind consultation, we will continue to follow.   Doree Albee  11/11/2022, 2:44 PM

## 2022-11-11 NOTE — ED Triage Notes (Signed)
Pt reports bright red blood in stool this morning. Denies pain

## 2022-11-11 NOTE — ED Provider Notes (Addendum)
[facility] AT [facility] Provider Note   CSN: [id number] Arrival date & time: 11/11/22  1014     History  Chief Complaint  Patient presents with   Rectal Bleeding    Stephen Wilkins is a 89 y.o. male.  Patient with onset of red blood per rectum large amount x 2.  Has had some abdominal discomfort for the past several days.  Was seen by primary care doctor earlier in the week and they thought maybe there was a hardness to his abdomen.  Patient is never had any rectal bleeding before.  Patient had a colonoscopy done by [facility] gastroenterology many years ago.  Patient is not on any blood thinners.  Patient states she still has some intermittent abdominal pain associated with it.  Vital signs here temp 97.6 blood pressure 152/96 heart rate 92 oxygen saturation on room air is 95%  Past medical history significant for for hyperlipidemia hypertension mild cognitive impairment Schatzki's ring hypothyroidism COPD.  Patient is never used tobacco products.       Home Medications Prior to Admission medications   Medication Sig Start Date End Date Taking? Authorizing Provider  albuterol (PROVENTIL) (2.5 MG/3ML) 0.083% nebulizer solution Take 3 mLs (2.5 mg total) by nebulization every 6 (six) hours as needed for wheezing or shortness of breath. 06/02/22  Yes Corwin Levins, MD  albuterol (VENTOLIN HFA) 108 (90 Base) MCG/ACT inhaler INHALE 2 PUFFS BY MOUTH EVERY 6 HOURS AS NEEDED FOR WHEEZING FOR SHORTNESS OF BREATH Patient taking differently: Inhale 2 puffs into the lungs every 6 (six) hours as needed for shortness of breath. 08/12/22  Yes Corwin Levins, MD  ALPRAZolam Prudy Feeler) 0.5 MG tablet 1/2 - 1 tab by mouth twice per day as needed 09/22/20  Yes Corwin Levins, MD  aspirin EC 81 MG tablet Take by mouth. 02/10/16  Yes [provider]  budesonide-formoterol (SYMBICORT) 80-4.5 MCG/ACT inhaler Inhale 2 puffs into the lungs daily as needed (shortness of breath).  05/09/17  Yes [provider]  Cholecalciferol (VITAMIN D) 1000 UNITS capsule Take 1,000 Units by mouth every other day.    Yes [provider]  Cholecalciferol 50 MCG (2000 UT) TABS 1 tab by mouth once daily Patient taking differently: Take 1 tablet by mouth daily. 04/16/21  Yes Corwin Levins, MD  cyanocobalamin 50 MCG tablet Take 100 mcg by mouth daily. 03/02/22  Yes [provider]  desoximetasone (TOPICORT) 0.25 % cream APPLY SPARINGLY TO THE AFFECTED AREA TWICE DAILY AS NEEDED AS DIRECTED Patient taking differently: Apply 1 application  topically 2 (two) times daily. Itching 04/30/18  Yes Corwin Levins, MD  methylPREDNISolone (MEDROL DOSEPAK) 4 MG TBPK tablet Take 24 mg on day 1, 20 mg on day 2, 16 mg on day 3, 12 mg on day 4, 8 mg on day 5, 4 mg on day 6.  Take all tablets in each row at once, do not spread tablets out throughout the day. 10/29/21  Yes Theadora Rama Scales, PA-C  montelukast (SINGULAIR) 10 MG tablet Take 1 tablet (10 mg total) by mouth at bedtime. 10/03/22  Yes Corwin Levins, MD  Multiple Vitamin (MULTIVITAMINS PO) Take 1 tablet by mouth daily. 03/02/22  Yes [provider]  Multiple Vitamins-Minerals (CENTRUM SILVER PO) Take 1 tablet by mouth daily.    Yes [provider]  polyethylene glycol powder (MIRALAX) powder Take 1/2 capful by mouth once daily Patient taking differently: Take 17 g by mouth  2 (two) times a week. 01/28/15  Yes Corwin Levins, MD  predniSONE (DELTASONE) 10 MG tablet 3 tabs by mouth per day for 3 days,2tabs per day for 3 days,1tab per day for 3 days 10/03/22  Yes Corwin Levins, MD  rosuvastatin (CRESTOR) 10 MG tablet Take 1 tablet by mouth once daily 08/11/22  Yes Corwin Levins, MD  sodium chloride (MURO 128) 5 % ophthalmic solution Place 1 drop into both eyes 4 (four) times daily as needed for eye irritation. 05/24/22  Yes [provider]  solifenacin (VESICARE) 5 MG tablet Take 1 tablet (5 mg total) by mouth  daily. 04/25/22  Yes Corwin Levins, MD  tamsulosin (FLOMAX) 0.4 MG CAPS capsule TAKE 1 CAPSULE(0.4 MG) BY MOUTH AT BEDTIME Patient taking differently: Take 0.4 mg by mouth at bedtime. 04/25/22  Yes Corwin Levins, MD  vitamin B-12 (CYANOCOBALAMIN) 100 MCG tablet Take 100 mcg by mouth once a week.   Yes [provider]  vitamin C (ASCORBIC ACID) 500 MG tablet Take 1,000 mg by mouth daily.    Yes [provider]  cetirizine (ZYRTEC) 10 MG tablet Take 1 tablet (10 mg total) by mouth daily. Patient not taking: Reported on 11/11/2022 12/17/21 12/17/22  Corwin Levins, MD  fluticasone [facility] HFA) 220 MCG/ACT inhaler Inhale 2 puffs into the lungs in the morning and at bedtime. Patient not taking: Reported on 11/11/2022 10/03/22   Corwin Levins, MD  Fluticasone Propionate, Inhal, 250 MCG/ACT AEPB Inhale 1 puff into the lungs 2 (two) times daily. Patient not taking: Reported on 11/11/2022 10/29/21 10/24/22  Theadora Rama Scales, PA-C  lansoprazole (PREVACID) 30 MG capsule TAKE 1 CAPSULE BY MOUTH ONCE DAILY AT  12  NOON Patient not taking: Reported on 11/11/2022 02/22/21   Corwin Levins, MD  levothyroxine (SYNTHROID) 112 MCG tablet Take 1 tablet by mouth once daily Patient not taking: Reported on 11/11/2022 07/07/21   Corwin Levins, MD  lidocaine (LIDODERM) 5 % Place 1 patch onto the skin daily. Remove & Discard patch within 12 hours or as directed by MD Patient not taking: Reported on 11/11/2022 06/09/22   Charlynne Pander, MD  losartan (COZAAR) 100 MG tablet Take 0.5 tablets (50 mg total) by mouth daily. Patient not taking: Reported on 11/11/2022 06/28/22   Corwin Levins, MD  RSV vaccine recomb adjuvanted (AREXVY) 120 MCG/0.5ML injection Inject into the muscle. 03/09/22   Judyann Munson, MD      Allergies    Atorvastatin, Doxazosin mesylate, Hydrocodone bit-homatrop mbr, and Hydrocodone    Review of Systems   Review of Systems  Constitutional:  Negative for chills and fever.  HENT:  Negative for  rhinorrhea and sore throat.   Eyes:  Negative for visual disturbance.  Respiratory:  Negative for cough and shortness of breath.   Cardiovascular:  Negative for chest pain and leg swelling.  Gastrointestinal:  Positive for abdominal pain and blood in stool. Negative for diarrhea, nausea and vomiting.  Genitourinary:  Negative for dysuria.  Musculoskeletal:  Negative for back pain and neck pain.  Skin:  Negative for rash.  Neurological:  Negative for dizziness, light-headedness and headaches.  Hematological:  Does not bruise/bleed easily.  Psychiatric/Behavioral:  Negative for confusion.     Physical Exam Updated Vital Signs BP (!) 152/97 (BP Location: Right Arm)   Pulse 92   Temp 97.6 F (36.4 C) (Oral)   Resp 17   Ht 1.829 m (6')   Wt 80.9  kg   SpO2 95%   BMI 24.19 kg/m  Physical Exam Vitals and nursing note reviewed.  Constitutional:      General: He is not in acute distress.    Appearance: Normal appearance. He is well-developed.  HENT:     Head: Normocephalic and atraumatic.  Eyes:     Extraocular Movements: Extraocular movements intact.     Conjunctiva/sclera: Conjunctivae normal.     Pupils: Pupils are equal, round, and reactive to light.  Cardiovascular:     Rate and Rhythm: Normal rate and regular rhythm.     Heart sounds: No murmur heard. Pulmonary:     Effort: Pulmonary effort is normal. No respiratory distress.     Breath sounds: Normal breath sounds.  Abdominal:     Palpations: Abdomen is soft.     Tenderness: There is no abdominal tenderness.  Genitourinary:    Rectum: Guaiac result positive.     Comments: Gross blood per rectum.  Initially appeared somewhat maroon but now more bright red blood.  No prolapsed internal hemorrhoids or external hemorrhoids no fissure. Musculoskeletal:        General: No swelling.     Cervical back: Normal range of motion and neck supple.  Skin:    General: Skin is warm and dry.     Capillary Refill: Capillary refill  takes less than 2 seconds.  Neurological:     General: No focal deficit present.     Mental Status: He is alert and oriented to person, place, and time.     Cranial Nerves: No cranial nerve deficit.     Sensory: No sensory deficit.     Motor: No weakness.  Psychiatric:        Mood and Affect: Mood normal.     ED Results / Procedures / Treatments   Labs (all labs ordered are listed, but only abnormal results are displayed) Labs Reviewed  CBC WITH DIFFERENTIAL/PLATELET - Abnormal; Notable for the following components:      Result Value   RBC 3.77 (*)    Hemoglobin 11.8 (*)    HCT 37.1 (*)    All other components within normal limits  COMPREHENSIVE METABOLIC PANEL  POC OCCULT BLOOD, ED  TYPE AND SCREEN    EKG None  Radiology No results found.  Procedures Procedures    Medications Ordered in ED Medications - No data to display  ED Course/ Medical Decision Making/ A&P                                 Medical Decision Making Amount and/or Complexity of Data Reviewed Labs: ordered. Radiology: ordered.  Risk Prescription drug management. Decision regarding hospitalization.   Significant rectal bleeding red blood.  No evidence of any fissure or prolapsed internal hemorrhoids.  Patient's vital signs are stable.  Because of the history of the abdominal discomfort we will going get CT scan.  CBC white count 8.5 hemoglobin 11.8 hemoglobin is been around 12 before platelets are normal at 242.  Complete metabolic panel pending.  And CT scan pending.  Will discuss with [facility] on call.  Will most likely require admission since he is already had 2 large bloody bowel movements today.  Patient remaining hemodynamically stable.  CT scan done here today without any acute findings.  But CT scan done at the [place] back in June did show evidence of diverticulosis.  They did make comment about it directly  today.  Will discuss with [facility] and then have  hospitalist admit for the GI bleed.  Discussed with hospitalist who will admit.  Patient remained hemodynamically stable.   Final Clinical Impression(s) / ED Diagnoses Final diagnoses:  Acute GI bleeding    Rx / DC Orders ED Discharge Orders     None         Vanetta Mulders, MD 11/11/22 1112    Vanetta Mulders, MD 11/11/22 1430    Vanetta Mulders, MD 11/11/22 1506    Vanetta Mulders, MD 11/11/22 1507

## 2022-11-12 ENCOUNTER — Observation Stay (HOSPITAL_COMMUNITY): Payer: No Typology Code available for payment source

## 2022-11-12 ENCOUNTER — Observation Stay (HOSPITAL_COMMUNITY): Payer: No Typology Code available for payment source | Admitting: Anesthesiology

## 2022-11-12 ENCOUNTER — Encounter (HOSPITAL_COMMUNITY)
Admission: EM | Disposition: A | Discharge status: Home / Self Care | Payer: Self-pay | Source: Home / Self Care | Attending: Internal Medicine

## 2022-11-12 DIAGNOSIS — Z6824 Body mass index (BMI) 24.0-24.9, adult: Secondary | ICD-10-CM | POA: Diagnosis not present

## 2022-11-12 DIAGNOSIS — I10 Essential (primary) hypertension: Secondary | ICD-10-CM | POA: Diagnosis not present

## 2022-11-12 DIAGNOSIS — D123 Benign neoplasm of transverse colon: Secondary | ICD-10-CM

## 2022-11-12 DIAGNOSIS — K922 Gastrointestinal hemorrhage, unspecified: Secondary | ICD-10-CM | POA: Diagnosis present

## 2022-11-12 DIAGNOSIS — Z7951 Long term (current) use of inhaled steroids: Secondary | ICD-10-CM | POA: Diagnosis not present

## 2022-11-12 DIAGNOSIS — N1832 Chronic kidney disease, stage 3b: Secondary | ICD-10-CM | POA: Diagnosis present

## 2022-11-12 DIAGNOSIS — F0390 Unspecified dementia without behavioral disturbance: Secondary | ICD-10-CM | POA: Diagnosis present

## 2022-11-12 DIAGNOSIS — D62 Acute posthemorrhagic anemia: Secondary | ICD-10-CM

## 2022-11-12 DIAGNOSIS — R634 Abnormal weight loss: Secondary | ICD-10-CM | POA: Diagnosis not present

## 2022-11-12 DIAGNOSIS — I129 Hypertensive chronic kidney disease with stage 1 through stage 4 chronic kidney disease, or unspecified chronic kidney disease: Secondary | ICD-10-CM | POA: Diagnosis present

## 2022-11-12 DIAGNOSIS — D122 Benign neoplasm of ascending colon: Secondary | ICD-10-CM

## 2022-11-12 DIAGNOSIS — K5909 Other constipation: Secondary | ICD-10-CM | POA: Diagnosis present

## 2022-11-12 DIAGNOSIS — D120 Benign neoplasm of cecum: Secondary | ICD-10-CM | POA: Diagnosis not present

## 2022-11-12 DIAGNOSIS — K641 Second degree hemorrhoids: Secondary | ICD-10-CM

## 2022-11-12 DIAGNOSIS — J449 Chronic obstructive pulmonary disease, unspecified: Secondary | ICD-10-CM | POA: Diagnosis not present

## 2022-11-12 DIAGNOSIS — K219 Gastro-esophageal reflux disease without esophagitis: Secondary | ICD-10-CM | POA: Diagnosis present

## 2022-11-12 DIAGNOSIS — J4489 Other specified chronic obstructive pulmonary disease: Secondary | ICD-10-CM | POA: Diagnosis present

## 2022-11-12 DIAGNOSIS — J4550 Severe persistent asthma, uncomplicated: Secondary | ICD-10-CM | POA: Diagnosis present

## 2022-11-12 DIAGNOSIS — D126 Benign neoplasm of colon, unspecified: Secondary | ICD-10-CM | POA: Diagnosis not present

## 2022-11-12 DIAGNOSIS — Z66 Do not resuscitate: Secondary | ICD-10-CM | POA: Diagnosis present

## 2022-11-12 DIAGNOSIS — D124 Benign neoplasm of descending colon: Secondary | ICD-10-CM | POA: Diagnosis not present

## 2022-11-12 DIAGNOSIS — K5900 Constipation, unspecified: Secondary | ICD-10-CM | POA: Diagnosis not present

## 2022-11-12 DIAGNOSIS — E785 Hyperlipidemia, unspecified: Secondary | ICD-10-CM | POA: Diagnosis present

## 2022-11-12 DIAGNOSIS — D128 Benign neoplasm of rectum: Secondary | ICD-10-CM

## 2022-11-12 DIAGNOSIS — K5730 Diverticulosis of large intestine without perforation or abscess without bleeding: Secondary | ICD-10-CM | POA: Diagnosis not present

## 2022-11-12 DIAGNOSIS — K5731 Diverticulosis of large intestine without perforation or abscess with bleeding: Secondary | ICD-10-CM | POA: Diagnosis present

## 2022-11-12 DIAGNOSIS — K625 Hemorrhage of anus and rectum: Secondary | ICD-10-CM | POA: Diagnosis not present

## 2022-11-12 DIAGNOSIS — E039 Hypothyroidism, unspecified: Secondary | ICD-10-CM | POA: Diagnosis present

## 2022-11-12 DIAGNOSIS — K921 Melena: Secondary | ICD-10-CM | POA: Diagnosis not present

## 2022-11-12 DIAGNOSIS — I700 Atherosclerosis of aorta: Secondary | ICD-10-CM | POA: Diagnosis present

## 2022-11-12 DIAGNOSIS — I712 Thoracic aortic aneurysm, without rupture, unspecified: Secondary | ICD-10-CM | POA: Diagnosis present

## 2022-11-12 DIAGNOSIS — N401 Enlarged prostate with lower urinary tract symptoms: Secondary | ICD-10-CM | POA: Diagnosis present

## 2022-11-12 DIAGNOSIS — K7689 Other specified diseases of liver: Secondary | ICD-10-CM | POA: Diagnosis present

## 2022-11-12 HISTORY — PX: POLYPECTOMY: SHX5525

## 2022-11-12 HISTORY — PX: COLONOSCOPY WITH PROPOFOL: SHX5780

## 2022-11-12 HISTORY — PX: HEMOSTASIS CLIP PLACEMENT: SHX6857

## 2022-11-12 LAB — PROTIME-INR
INR: 1.1 (ref 0.8–1.2)
Prothrombin Time: 14 seconds (ref 11.4–15.2)

## 2022-11-12 SURGERY — COLONOSCOPY WITH PROPOFOL
Anesthesia: Monitor Anesthesia Care

## 2022-11-12 MED ORDER — PHENYLEPHRINE 80 MCG/ML (10ML) SYRINGE FOR IV PUSH (FOR BLOOD PRESSURE SUPPORT)
PREFILLED_SYRINGE | INTRAVENOUS | Status: DC | PRN
  Administered 2022-11-12 (×11): 160 ug via INTRAVENOUS

## 2022-11-12 MED ORDER — PROPOFOL 10 MG/ML IV BOLUS
INTRAVENOUS | Status: AC
  Filled 2022-11-12: qty 20

## 2022-11-12 MED ORDER — BISACODYL 5 MG PO TBEC
10.0000 mg | DELAYED_RELEASE_TABLET | ORAL | Status: AC
  Administered 2022-11-12: 10 mg via ORAL
  Filled 2022-11-12: qty 2

## 2022-11-12 MED ORDER — PROPOFOL 10 MG/ML IV BOLUS
INTRAVENOUS | Status: DC | PRN
  Administered 2022-11-12 (×2): 10 mg via INTRAVENOUS
  Administered 2022-11-12 (×2): 20 mg via INTRAVENOUS
  Administered 2022-11-12: 50 mg via INTRAVENOUS
  Administered 2022-11-12: 30 mg via INTRAVENOUS
  Administered 2022-11-12: 20 mg via INTRAVENOUS

## 2022-11-12 MED ORDER — LIDOCAINE 2% (20 MG/ML) 5 ML SYRINGE
INTRAMUSCULAR | Status: DC | PRN
  Administered 2022-11-12: 40 mg via INTRAVENOUS

## 2022-11-12 MED ORDER — LACTATED RINGERS IV SOLN: INTRAVENOUS | Status: DC | PRN

## 2022-11-12 SURGICAL SUPPLY — 22 items

## 2022-11-12 NOTE — Plan of Care (Signed)

## 2022-11-12 NOTE — Anesthesia Procedure Notes (Signed)
Procedure Name: MAC Date/Time: 11/12/2022 10:00 AM  Performed by: Adria Dill, CRNAPre-anesthesia Checklist: Patient identified, Emergency Drugs available, Suction available and Patient being monitored Patient Re-evaluated:Patient Re-evaluated prior to induction Oxygen Delivery Method: Nasal cannula Preoxygenation: Pre-oxygenation with 100% oxygen Induction Type: IV induction Placement Confirmation: positive ETCO2 and breath sounds checked- equal and bilateral Dental Injury: Teeth and Oropharynx as per pre-operative assessment

## 2022-11-12 NOTE — Anesthesia Postprocedure Evaluation (Signed)
Anesthesia Post Note  Patient: Stephen Wilkins  Procedure(s) Performed: COLONOSCOPY WITH PROPOFOL POLYPECTOMY     Patient location during evaluation: PACU Anesthesia Type: MAC Level of consciousness: awake and alert Pain management: pain level controlled Vital Signs Assessment: post-procedure vital signs reviewed and stable Respiratory status: spontaneous breathing, nonlabored ventilation, respiratory function stable and patient connected to nasal cannula oxygen Cardiovascular status: stable and blood pressure returned to baseline Postop Assessment: no apparent nausea or vomiting Anesthetic complications: no  No notable events documented.  Last Vitals:  Vitals:   11/12/22 1106 11/12/22 1413  BP:  107/71  Pulse: 89 89  Resp: 18 16  Temp:  36.8 C  SpO2: 93% 99%    Last Pain:  Vitals:   11/12/22 1413  TempSrc: Oral  PainSc:                  [name]

## 2022-11-12 NOTE — Progress Notes (Signed)
PROGRESS NOTE    Stephen Wilkins  NGE:[id number] DOB: 1928/02/10 DOA: 11/11/2022 PCP: Corwin Levins, MD    Brief Narrative:  89 year old with history of dementia, COPD, BPH, esophageal stricture, hypertension, hyperlipidemia and diverticulosis presented with 2 episodes of hematochezia, 1 episode of large-volume bright red blood.  Frequently constipated.  Previous history of diverticulosis.  In the emergency room hemodynamically stable.  Fecal occult positive.  Hemoglobin 11.8.  CT scan abdomen pelvis without any acute findings.  Admitted, underwent bowel prep and colonoscopy today.   Assessment & Plan:   Acute lower GI bleeding secondary to diverticulosis: Baseline hemoglobin was 12.5.  Presented with hemoglobin 11.8-10.8 today.  Will continue to monitor.  Currently no evidence of ongoing bleeding.  Check every 12 hours. Underwent colonoscopy and found to have blood clot in the sigmoid and descending colon.  20 mm polyp at ileocecal valve, removed.  Diverticulum sigmoid colon with blood clot, MR conditional clip was placed. Continue monitoring.  Advance diet.  Recheck hemoglobin tomorrow morning before discharge.  Avoid constipation.  Anticipate home tomorrow.  History of hypothyroidism, TSH is normal now.  Not taking thyroxine. Hyperlipidemia, on statin.  Continue. GERD, on Protonix CKD, stable COPD, stable Essential hypertension, stable off medications   DVT prophylaxis: SCDs Start: 11/11/22 1705   Code Status: DNR Family Communication: Wife at the bedside Disposition Plan: Status is: Observation The patient will require care spanning > 2 midnights and should be moved to inpatient because: Significant lower GI bleeding, monitoring, procedure     Consultants:  Gastroenterology  Procedures:  Colonoscopy 8/10  Antimicrobials:  None   Subjective: Came back from procedure. Denies any complaints but feels oozy due to sedation   Objective: Vitals:   11/12/22 1055 11/12/22  1100 11/12/22 1105 11/12/22 1106  BP:  (!) 144/77    Pulse: 87 91 88 89  Resp: 17 17 18 18   Temp:      TempSrc:      SpO2: 93% 94% 94% 93%  Weight:      Height:        Intake/Output Summary (Last 24 hours) at 11/12/2022 1410 Last data filed at 11/12/2022 1015 Gross per 24 hour  Intake 250 ml  Output --  Net 250 ml   Filed Weights   11/11/22 1022 11/12/22 0933  Weight: 80.9 kg 80.9 kg    Examination:  Physical Exam Constitutional:      Appearance: Normal appearance.  HENT:     Head: Atraumatic.  Cardiovascular:     Rate and Rhythm: Normal rate and regular rhythm.  Pulmonary:     Breath sounds: Normal breath sounds.  Abdominal:     General: Abdomen is flat.     Palpations: Abdomen is soft.  Neurological:     Mental Status: He is alert.        Data Reviewed: I have personally reviewed following labs and imaging studies  CBC: Recent Labs  Lab 11/08/22 1505 11/11/22 1047 11/12/22 0520  WBC 8.3 8.5 7.9  NEUTROABS 5.8 5.9  --   HGB 12.7* 11.8* 10.8*  HCT 39.8 37.1* 34.2*  MCV 95.6 98.4 99.7  PLT 247.0 242 231   Basic Metabolic Panel: Recent Labs  Lab 11/08/22 1505 11/11/22 1047 11/12/22 0520  NA 140 140 143  K 4.2 4.2 4.0  CL 102 107 106  CO2 30 26 26   GLUCOSE 97 99 105*  BUN 34* 30* 30*  CREATININE 1.75* 1.75* 1.76*  CALCIUM 9.4 8.7* 8.5*  GFR: Estimated Creatinine Clearance: 29.4 mL/min (A) (by C-G formula based on SCr of 1.76 mg/dL (H)). Liver Function Tests: Recent Labs  Lab 11/08/22 1505 11/11/22 1047 11/12/22 0520  AST 19 17 18   ALT 11 14 14   ALKPHOS 54 52 49  BILITOT 0.8 1.2 1.1  PROT 6.5 5.9* 5.5*  ALBUMIN 3.9 3.3* 3.1*   No results for input(s): "LIPASE", "AMYLASE" in the last 168 hours. No results for input(s): "AMMONIA" in the last 168 hours. Coagulation Profile: Recent Labs  Lab 11/12/22 0712  INR 1.1   Cardiac Enzymes: No results for input(s): "CKTOTAL", "CKMB", "CKMBINDEX", "TROPONINI" in the last 168 hours. BNP  (last 3 results) No results for input(s): "PROBNP" in the last 8760 hours. HbA1C: No results for input(s): "HGBA1C" in the last 72 hours. CBG: No results for input(s): "GLUCAP" in the last 168 hours. Lipid Profile: No results for input(s): "CHOL", "HDL", "LDLCALC", "TRIG", "CHOLHDL", "LDLDIRECT" in the last 72 hours. Thyroid Function Tests: Recent Labs    11/12/22 0520  TSH 3.314   Anemia Panel: No results for input(s): "VITAMINB12", "FOLATE", "FERRITIN", "TIBC", "IRON", "RETICCTPCT" in the last 72 hours. Sepsis Labs: No results for input(s): "PROCALCITON", "LATICACIDVEN" in the last 168 hours.  No results found for this or any previous visit (from the past 240 hour(s)).       Radiology Studies: CT ABDOMEN PELVIS W CONTRAST  Result Date: 11/11/2022 CLINICAL DATA:  Abdominal pain.  Blood per rectum. EXAM: CT ABDOMEN AND PELVIS WITH CONTRAST TECHNIQUE: Multidetector CT imaging of the abdomen and pelvis was performed using the standard protocol following bolus administration of intravenous contrast. RADIATION DOSE REDUCTION: This exam was performed according to the departmental dose-optimization program which includes automated exposure control, adjustment of the mA and/or kV according to patient size and/or use of iterative reconstruction technique. CONTRAST:  75mL OMNIPAQUE IOHEXOL 300 MG/ML  SOLN COMPARISON:  CT 12/27/2018 abdomen pelvis FINDINGS: Lower chest: There is some linear opacity lung bases likely scar or atelectasis. No pleural effusion. Stable tiny 3 mm nodule medial right lung base on series 6, image 26 demonstrating long-term stability. No specific imaging follow-up. Mild lung base bronchial wall thickening. Hepatobiliary: Layering stones within the gallbladder. Gallbladder itself is nondilated. There is a benign-appearing cysts seen centrally along the liver on series 2, image 22 measuring 15 mm. Unchanged from prior demonstrating long-term stability. Patent portal vein.  Pancreas: There is some pancreatic atrophy identified. There are several punctate calcifications in the head/uncinate process of the pancreas. Please correlate for any history such as chronic calcific pancreatitis. These were seen previously. No obvious pancreatic mass. Spleen: Normal in size without focal abnormality. Adrenals/Urinary Tract: Adrenal glands are preserved. There is moderate atrophy of the kidneys. Exophytic Bosniak 1 right-sided renal cysts identified measuring up to 4.1 cm in diameter with Hounsfield unit 9. There are some smaller Bosniak 1 and 2 lesions elsewhere bilaterally. There are punctate bilateral nonobstructing renal stones. The ureters have normal course and caliber extending down to the bladder. Preserved contours of the urinary bladder. Stomach/Bowel: Moderate colonic stool. Large bowel is of normal course and caliber. Left-sided colonic diverticula. Normal appendix. Stomach is underdistended. Small bowel is nondilated. Few small bowel diverticula identified along the ileum. Vascular/Lymphatic: Diffuse partially calcified atherosclerotic plaque. There are several areas of irregular soft plaque along the abdominal aorta. Dilatation of the common iliac arteries on the right measuring up to 17 mm and left 14 mm. Areas of stenosis as well along the internal iliac vessels. No  specific abnormal lymph node enlargement identified in the abdomen and pelvis. Reproductive: Prominent prostate. Other: No free air or free fluid. Musculoskeletal: Curvature and degenerative changes along the spine. IMPRESSION: No bowel obstruction, free air or free fluid. Normal appendix. Moderate colonic stool. Scattered bowel diverticula. Gallstones. Bilateral renal atrophy with a benign cysts and nonobstructing renal stones. Diffuse atherosclerotic changes identified including areas of irregular soft plaque along the abdominal aorta. Electronically Signed   By: Karen Kays M.D.   On: 11/11/2022 14:10         Scheduled Meds:  feeding supplement  1 Container Oral TID BM   rosuvastatin  10 mg Oral QHS   tamsulosin  0.4 mg Oral QHS   Continuous Infusions:   LOS: 0 days    Time spent: 35 minutes    Dorcas Carrow, MD [facility]

## 2022-11-12 NOTE — Transfer of Care (Signed)
Immediate Anesthesia Transfer of Care Note  Patient: Stephen Wilkins  Procedure(s) Performed: COLONOSCOPY WITH PROPOFOL POLYPECTOMY  Patient Location: PACU  Anesthesia Type:MAC  Level of Consciousness: drowsy and patient cooperative  Airway & Oxygen Therapy: Patient Spontanous Breathing and Patient connected to nasal cannula oxygen  Post-op Assessment: Report given to RN and Post -op Vital signs reviewed and stable  Post vital signs: Reviewed and stable  Last Vitals:  Vitals Value Taken Time  BP 120/56 11/12/22 1037  Temp    Pulse    Resp 18 11/12/22 1039  SpO2    Vitals shown include unfiled device data.  Last Pain:  Vitals:   11/12/22 0933  TempSrc: Tympanic  PainSc: 0-No pain      Patients Stated Pain Goal: 0 (11/11/22 1645)  Complications: No notable events documented.

## 2022-11-12 NOTE — Op Note (Signed)
[facility] Patient Name: Stephen Wilkins Procedure Date: 11/12/2022 MRN: [id number] Attending MD: Corliss Parish , MD, [id number] Date of Birth: 1927/09/23 CSN: [id number] Age: 89 Admit Type: Inpatient Procedure:                Colonoscopy Indications:              Hematochezia, Acute post hemorrhagic anemia Providers:                Corliss Parish, MD, Eliberto Ivory, RN, Priscella Mann, Technician Referring MD:             Inpatient medical service Medicines:                Monitored Anesthesia Care Complications:            No immediate complications. Estimated Blood Loss:     Estimated blood loss was minimal. Procedure:                Pre-Anesthesia Assessment:                           - Prior to the procedure, a History and Physical                            was performed, and patient medications and                            allergies were reviewed. The patient's tolerance of                            previous anesthesia was also reviewed. The risks                            and benefits of the procedure and the sedation                            options and risks were discussed with the patient.                            All questions were answered, and informed consent                            was obtained. Prior Anticoagulants: The patient has                            taken no anticoagulant or antiplatelet agents                            except for aspirin. ASA Grade Assessment: III - A                            patient with severe systemic disease. After  reviewing the risks and benefits, the patient was                            deemed in satisfactory condition to undergo the                            procedure.                           After obtaining informed consent, the colonoscope                            was passed under direct vision. Throughout the                             procedure, the patient's blood pressure, pulse, and                            oxygen saturations were monitored continuously. The                            CF-HQ190L ([id number] Olympus colonoscope was                            introduced through the anus and advanced to the 3                            cm into the ileum. The colonoscopy was performed                            without difficulty. The patient tolerated the                            procedure. The quality of the bowel preparation was                            fair. The terminal ileum, ileocecal valve,                            appendiceal orifice, and rectum were photographed. Scope In: 10:05:06 AM Scope Out: 10:31:35 AM Scope Withdrawal Time: 0 hours 23 minutes 38 seconds  Total Procedure Duration: 0 hours 26 minutes 29 seconds  Findings:      The digital rectal exam findings include hemorrhoids. Pertinent       negatives include no palpable rectal lesions.      Clotted blood was found in the sigmoid colon and in the descending colon       upon insertion.      Copious quantities of semi-liquid semi-solid stool was found in the rest       of the colon, interfering with visualization. Lavage of the area was       performed using copious amounts, resulting in clearance with only fair       visualization.      A 20 mm polyp was found at the ascending colon side of the ileocecal  valve. The polyp was semi-sessile. Preparations were made for attempt at       mucosal resection. Demarcation of the lesion was performed with       high-definition white light and narrow band imaging to clearly identify       the boundaries of the lesion. Water infusion was performed to proceed       with underwater EMR which allowed the lesion to lift/raise. Piecemeal       cold mucosal resection using a snare was performed. Resection and       retrieval were complete. Resected tissue margins were examined and clear       of polyp  tissue.      Six sessile polyps were found in the rectum (1), descending colon (1),       transverse colon (3) and hepatic flexure (1). The polyps were 3 to 6 mm       in size. These polyps were removed with a cold snare. Resection and       retrieval were complete.      A single small-mouthed diverticulum was found in the sigmoid colon at 31       cm from anal os. There was evidence of a clot within this diverticulum.       This was flushed copiously to ensure that active bleeding wasn't noted.       Active oozing was not noted but I had concern this could be area of       recent bleeding. For hemostasis, one hemostatic clip was successfully       placed (MR conditional). Clip manufacturer: [employer]. There was       no bleeding at the end of the procedure.      Many other small-mouthed diverticula were found in the recto-sigmoid       colon, sigmoid colon and descending colon.      Non-bleeding non-thrombosed external and internal hemorrhoids were found       during retroflexion, during perianal exam and during digital exam. The       hemorrhoids were Grade II (internal hemorrhoids that prolapse but reduce       spontaneously). Impression:               - Preparation of the colon was fair even after                            copious lavage.                           - Hemorrhoids found on digital rectal exam.                           - Blood clot in the sigmoid colon and in the                            descending colon.                           - Nevills stool in the rest of examined colon.                           - One 20 mm polyp at the ileocecal valve, removed  with underwater cold piecemeal mucosal resection.                            Resected and retrieved.                           - Six, 3 to 6 mm polyps in the rectum, in the                            descending colon, in the transverse colon and at                            the hepatic  flexure, removed with a cold snare.                            Resected and retrieved.                           - Diverticulum in the sigmoid colon at 31 cm from                            anal os with a blood clot. Clip (MR conditional)                            was placed. Clip manufacturer: [employer].                           - Diverticulosis in the recto-sigmoid colon, in the                            sigmoid colon and in the descending colon.                           - Non-bleeding non-thrombosed external and internal                            hemorrhoids. Moderate Sedation:      Not Applicable - Patient had care per Anesthesia. Recommendation:           - The patient will be observed post-procedure,                            until all discharge criteria are met.                           - Return patient to hospital ward for ongoing care.                           - Advance diet as tolerated.                           - Recommend 2 view KUB (not portable) to evaluate  the placed clip at site of potential diverticular                            bleed.                           - Trend hemoglobin/hematocrit today.                           - If patient is doing well into tomorrow, think                            discharge is reasonable.                           - IV iron as needed.                           - Minimize NSAIDs as able.                           - Follow-up pathology                           - This patient's bleeding was likely diverticular                            in origin.                           - Although the patient's preparation was only fair,                            he is 89 years of age, I did not see any evidence                            of large masses or lesions. I do not think that                            further colonoscopic evaluation is required unless                            there becomes  concern again in the future of                            bleeding that cannot be managed supportively or                            with interventional radiology interventions.                           - The findings and recommendations were discussed                            with the patient.                           -  The findings and recommendations were discussed                            with the patient's family.                           - The findings and recommendations were discussed                            with the referring physician. Procedure Code(s):        --- Professional ---                           [id number], Colonoscopy, flexible; with endoscopic                            mucosal resection                           [phone], 59, Colonoscopy, flexible; with control of                            bleeding, any method                           [id number], 59, Colonoscopy, flexible; with removal of                            tumor(s), polyp(s), or other lesion(s) by snare                            technique Diagnosis Code(s):        --- Professional ---                           K64.1, Second degree hemorrhoids                           K92.2, Gastrointestinal hemorrhage, unspecified                           D12.0, Benign neoplasm of cecum                           D12.8, Benign neoplasm of rectum                           D12.4, Benign neoplasm of descending colon                           D12.3, Benign neoplasm of transverse colon (hepatic                            flexure or splenic flexure)                           K92.1, Melena (includes Hematochezia)  D62, Acute posthemorrhagic anemia                           K57.30, Diverticulosis of large intestine without                            perforation or abscess without bleeding CPT copyright 2022 [employer]. All rights reserved. The codes documented in this report are  preliminary and upon coder review may  be revised to meet current compliance requirements. Corliss Parish, MD 11/12/2022 10:48:56 AM Number of Addenda: 0

## 2022-11-12 NOTE — Anesthesia Preprocedure Evaluation (Addendum)
Anesthesia Evaluation  Patient identified by MRN, date of birth, ID band Patient awake    Reviewed: Allergy & Precautions, NPO status , Patient's Chart, lab work & pertinent test results  Airway Mallampati: II  TM Distance: >3 FB Neck ROM: Full    Dental  (+) Lower Dentures   Pulmonary asthma , COPD   breath sounds clear to auscultation       Cardiovascular hypertension, + Valvular Problems/Murmurs  Rhythm:Regular Rate:Normal  Echo: 1. Left ventricular ejection fraction, by estimation, is 55 to 60%. The  left ventricle has normal function. The left ventricle has no regional  wall motion abnormalities. There is mild concentric left ventricular  hypertrophy. Left ventricular diastolic  parameters are indeterminate.   2. Right ventricular systolic function is normal. The right ventricular  size is normal. There is normal pulmonary artery systolic pressure.   3. The mitral valve is normal in structure. Mild mitral valve  regurgitation. No evidence of mitral stenosis. Moderate to severe mitral  annular calcification.   4. Tricuspid valve regurgitation is mild to moderate.   5. The aortic valve is tricuspid. There is mild calcification of the  aortic valve. There is mild thickening of the aortic valve. Aortic valve  regurgitation is mild. Aortic valve sclerosis is present, with no evidence  of aortic valve stenosis.   6. There is moderate dilatation of the ascending aorta, measuring 40 mm.   7. The inferior vena cava is normal in size with greater than 50%  respiratory variability, suggesting right atrial pressure of 3 mmHg.     Neuro/Psych  PSYCHIATRIC DISORDERS Anxiety    Dementia  Neuromuscular disease    GI/Hepatic Neg liver ROS,GERD  ,,  Endo/Other  Hypothyroidism    Renal/GU Renal disease     Musculoskeletal  (+) Arthritis ,    Abdominal   Peds  Hematology  (+) Blood dyscrasia, anemia   Anesthesia Other  Findings   Reproductive/Obstetrics                             Anesthesia Physical Anesthesia Plan  ASA: 3  Anesthesia Plan: MAC   Post-op Pain Management: Minimal or no pain anticipated   Induction: Intravenous  PONV Risk Score and Plan: 0 and Propofol infusion  Airway Management Planned: Natural Airway and Nasal Cannula  Additional Equipment: None  Intra-op Plan:   Post-operative Plan:   Informed Consent: I have reviewed the patients History and Physical, chart, labs and discussed the procedure including the risks, benefits and alternatives for the proposed anesthesia with the patient or authorized representative who has indicated his/her understanding and acceptance.   Patient has DNR.  Discussed DNR with patient and Suspend DNR.     Plan Discussed with: CRNA  Anesthesia Plan Comments:        Anesthesia Quick Evaluation

## 2022-11-13 DIAGNOSIS — K625 Hemorrhage of anus and rectum: Secondary | ICD-10-CM | POA: Diagnosis not present

## 2022-11-13 DIAGNOSIS — Z86010 Personal history of colonic polyps: Secondary | ICD-10-CM

## 2022-11-13 DIAGNOSIS — K922 Gastrointestinal hemorrhage, unspecified: Secondary | ICD-10-CM | POA: Diagnosis not present

## 2022-11-13 DIAGNOSIS — Z8719 Personal history of other diseases of the digestive system: Secondary | ICD-10-CM

## 2022-11-13 LAB — CBC WITH DIFFERENTIAL/PLATELET
Abs Immature Granulocytes: 0.04 10*3/uL (ref 0.00–0.07)
Basophils Absolute: 0 10*3/uL (ref 0.0–0.1)
Basophils Relative: 0 %
Eosinophils Absolute: 0.2 10*3/uL (ref 0.0–0.5)
Eosinophils Relative: 3 %
HCT: 32.7 % — ABNORMAL LOW (ref 39.0–52.0)
Hemoglobin: 10.3 g/dL — ABNORMAL LOW (ref 13.0–17.0)
Immature Granulocytes: 1 %
Lymphocytes Relative: 19 %
Lymphs Abs: 1.5 10*3/uL (ref 0.7–4.0)
MCH: 31.2 pg (ref 26.0–34.0)
MCHC: 31.5 g/dL (ref 30.0–36.0)
MCV: 99.1 fL (ref 80.0–100.0)
Monocytes Absolute: 0.6 10*3/uL (ref 0.1–1.0)
Monocytes Relative: 7 %
Neutro Abs: 5.9 10*3/uL (ref 1.7–7.7)
Neutrophils Relative %: 70 %
Platelets: 221 10*3/uL (ref 150–400)
RBC: 3.3 MIL/uL — ABNORMAL LOW (ref 4.22–5.81)
RDW: 11.9 % (ref 11.5–15.5)
WBC: 8.3 10*3/uL (ref 4.0–10.5)
nRBC: 0 % (ref 0.0–0.2)

## 2022-11-13 NOTE — Discharge Summary (Signed)
Physician Discharge Summary  Stephen Wilkins AVW:[id number] DOB: 1927/08/15 DOA: 11/11/2022  PCP: Corwin Levins, MD  Admit date: 11/11/2022 Discharge date: 11/13/2022  Admitted From: Home Disposition: Home  Recommendations for Outpatient Follow-up:  Follow up with PCP in 1-2 weeks Please obtain BMP/CBC in one week GI to schedule follow-up  Discharge Condition: Stable CODE STATUS: DNR Diet recommendation: Low-salt diet, nutritional supplements, avoid constipation  Discharge summary: 89 year old with history of dementia, COPD, BPH, esophageal stricture, hypertension, hyperlipidemia and diverticulosis presented with 2 episodes of hematochezia, 1 episode of large-volume bright red blood.  Frequently constipated.  Previous history of diverticulosis.  In the emergency room hemodynamically stable.  Fecal occult positive.  Hemoglobin 11.8.  CT scan abdomen pelvis without any acute findings.  Admitted, underwent bowel prep and colonoscopy as below.  # Acute lower GI bleeding secondary to diverticulosis: Baseline hemoglobin was 12.5.  Presented with hemoglobin 11.8-10.8.  Hemoglobin remained stable after initial drop. Underwent colonoscopy and found to have blood clot in the sigmoid and descending colon.  20 mm polyp at ileocecal valve, removed.  Diverticulum sigmoid colon with blood clot, MR conditional clip was placed.  Stabilized.  Discharging home.  Avoiding constipation.  Monitor for further bleeding at home.   History of hypothyroidism, TSH is normal now.  Not taking thyroxine. Hyperlipidemia, on statin.  Continue. GERD, on Protonix CKD, stable COPD, stable Essential hypertension, stable off medications  History of for discharge.  Discharge Diagnoses:  Principal Problem:   Lower GI bleed Active Problems:   Hypothyroidism   Hyperlipidemia   GERD   Abnormal TSH   CKD (chronic kidney disease)   COPD (chronic obstructive pulmonary disease) (HCC)   HTN (hypertension)   Hematochezia    Unintentional weight loss   Chronic constipation   Acute on chronic anemia   Acute GI bleeding   Lower GI bleeding    Discharge Instructions  Discharge Instructions     Diet - low sodium heart healthy   Complete by: As directed    Increase activity slowly   Complete by: As directed       Allergies as of 11/13/2022       Reactions   Atorvastatin    REACTION: myalgia   Doxazosin Mesylate    REACTION: dizziness   Hydrocodone Bit-homatrop Mbr Other (See Comments)   anxiety   Hydrocodone Anxiety        Medication List     STOP taking these medications    cetirizine 10 MG tablet Commonly known as: ZYRTEC   fluticasone 220 MCG/ACT inhaler Commonly known as: Flovent HFA   Fluticasone Propionate (Inhal) 250 MCG/ACT Aepb   lansoprazole 30 MG capsule Commonly known as: PREVACID   levothyroxine 112 MCG tablet Commonly known as: SYNTHROID   lidocaine 5 % Commonly known as: Lidoderm   losartan 100 MG tablet Commonly known as: COZAAR   methylPREDNISolone 4 MG Tbpk tablet Commonly known as: MEDROL DOSEPAK   predniSONE 10 MG tablet Commonly known as: DELTASONE       TAKE these medications    albuterol (2.5 MG/3ML) 0.083% nebulizer solution Commonly known as: PROVENTIL Take 3 mLs (2.5 mg total) by nebulization every 6 (six) hours as needed for wheezing or shortness of breath. What changed: Another medication with the same name was changed. Make sure you understand how and when to take each.   albuterol 108 (90 Base) MCG/ACT inhaler Commonly known as: VENTOLIN HFA INHALE 2 PUFFS BY MOUTH EVERY 6 HOURS AS NEEDED FOR WHEEZING  FOR SHORTNESS OF BREATH What changed: See the new instructions.   ALPRAZolam 0.5 MG tablet Commonly known as: XANAX 1/2 - 1 tab by mouth twice per day as needed   Arexvy 120 MCG/0.5ML injection Generic drug: RSV vaccine recomb adjuvanted Inject into the muscle.   ascorbic acid 500 MG tablet Commonly known as: VITAMIN C Take  1,000 mg by mouth daily.   aspirin EC 81 MG tablet Take by mouth.   CENTRUM SILVER PO Take 1 tablet by mouth daily.   desoximetasone 0.25 % cream Commonly known as: TOPICORT APPLY SPARINGLY TO THE AFFECTED AREA TWICE DAILY AS NEEDED AS DIRECTED What changed:  how much to take how to take this when to take this additional instructions   montelukast 10 MG tablet Commonly known as: SINGULAIR Take 1 tablet (10 mg total) by mouth at bedtime.   MULTIVITAMINS PO Take 1 tablet by mouth daily.   polyethylene glycol powder 17 GM/SCOOP powder Commonly known as: MiraLax Take 1/2 capful by mouth once daily What changed:  how much to take how to take this when to take this additional instructions   rosuvastatin 10 MG tablet Commonly known as: CRESTOR Take 1 tablet by mouth once daily   sodium chloride 5 % ophthalmic solution Commonly known as: MURO 128 Place 1 drop into both eyes 4 (four) times daily as needed for eye irritation.   solifenacin 5 MG tablet Commonly known as: VESICARE Take 1 tablet (5 mg total) by mouth daily.   Symbicort 80-4.5 MCG/ACT inhaler Generic drug: budesonide-formoterol Inhale 2 puffs into the lungs daily as needed (shortness of breath).   tamsulosin 0.4 MG Caps capsule Commonly known as: FLOMAX TAKE 1 CAPSULE(0.4 MG) BY MOUTH AT BEDTIME What changed:  how much to take how to take this when to take this additional instructions   vitamin B-12 100 MCG tablet Commonly known as: CYANOCOBALAMIN Take 100 mcg by mouth once a week.   cyanocobalamin 50 MCG tablet Take 100 mcg by mouth daily.   Vitamin D 1000 units capsule Take 1,000 Units by mouth every other day. What changed: Another medication with the same name was changed. Make sure you understand how and when to take each.   Cholecalciferol 50 MCG (2000 UT) Tabs 1 tab by mouth once daily What changed:  how much to take how to take this when to take this additional instructions         Allergies  Allergen Reactions   Atorvastatin     REACTION: myalgia   Doxazosin Mesylate     REACTION: dizziness   Hydrocodone Bit-Homatrop Mbr Other (See Comments)    anxiety   Hydrocodone Anxiety    Consultations: Gastroenterology   Procedures/Studies: DG Abd 1 View  Result Date: 11/12/2022 CLINICAL DATA:  [id number] Foreign body alimentary tract [id number] EXAM: ABDOMEN - 1 VIEW COMPARISON:  11/11/2022 FINDINGS: A surgical clip is located at the peripheral aspect of the lower left abdomen. The bowel gas pattern is normal. No radio-opaque calculi or other significant radiographic abnormality are seen. IMPRESSION: Surgical clip located at the peripheral aspect of the lower left abdomen. Electronically Signed   By: Duanne Guess D.O.   On: 11/12/2022 14:56   CT ABDOMEN PELVIS W CONTRAST  Result Date: 11/11/2022 CLINICAL DATA:  Abdominal pain.  Blood per rectum. EXAM: CT ABDOMEN AND PELVIS WITH CONTRAST TECHNIQUE: Multidetector CT imaging of the abdomen and pelvis was performed using the standard protocol following bolus administration of intravenous contrast. RADIATION DOSE REDUCTION: This  exam was performed according to the departmental dose-optimization program which includes automated exposure control, adjustment of the mA and/or kV according to patient size and/or use of iterative reconstruction technique. CONTRAST:  75mL OMNIPAQUE IOHEXOL 300 MG/ML  SOLN COMPARISON:  CT 12/27/2018 abdomen pelvis FINDINGS: Lower chest: There is some linear opacity lung bases likely scar or atelectasis. No pleural effusion. Stable tiny 3 mm nodule medial right lung base on series 6, image 26 demonstrating long-term stability. No specific imaging follow-up. Mild lung base bronchial wall thickening. Hepatobiliary: Layering stones within the gallbladder. Gallbladder itself is nondilated. There is a benign-appearing cysts seen centrally along the liver on series 2, image 22 measuring 15 mm. Unchanged from prior  demonstrating long-term stability. Patent portal vein. Pancreas: There is some pancreatic atrophy identified. There are several punctate calcifications in the head/uncinate process of the pancreas. Please correlate for any history such as chronic calcific pancreatitis. These were seen previously. No obvious pancreatic mass. Spleen: Normal in size without focal abnormality. Adrenals/Urinary Tract: Adrenal glands are preserved. There is moderate atrophy of the kidneys. Exophytic Bosniak 1 right-sided renal cysts identified measuring up to 4.1 cm in diameter with Hounsfield unit 9. There are some smaller Bosniak 1 and 2 lesions elsewhere bilaterally. There are punctate bilateral nonobstructing renal stones. The ureters have normal course and caliber extending down to the bladder. Preserved contours of the urinary bladder. Stomach/Bowel: Moderate colonic stool. Large bowel is of normal course and caliber. Left-sided colonic diverticula. Normal appendix. Stomach is underdistended. Small bowel is nondilated. Few small bowel diverticula identified along the ileum. Vascular/Lymphatic: Diffuse partially calcified atherosclerotic plaque. There are several areas of irregular soft plaque along the abdominal aorta. Dilatation of the common iliac arteries on the right measuring up to 17 mm and left 14 mm. Areas of stenosis as well along the internal iliac vessels. No specific abnormal lymph node enlargement identified in the abdomen and pelvis. Reproductive: Prominent prostate. Other: No free air or free fluid. Musculoskeletal: Curvature and degenerative changes along the spine. IMPRESSION: No bowel obstruction, free air or free fluid. Normal appendix. Moderate colonic stool. Scattered bowel diverticula. Gallstones. Bilateral renal atrophy with a benign cysts and nonobstructing renal stones. Diffuse atherosclerotic changes identified including areas of irregular soft plaque along the abdominal aorta. Electronically Signed   By:  Karen Kays M.D.   On: 11/11/2022 14:10   (Echo, Carotid, EGD, Colonoscopy, ERCP)    Subjective: Patient seen in the morning rounds.  Wife at the bedside.  Denies any complaints.  No bowel movement since the procedure.  Denies any nausea vomiting abdominal pain or cramping.   Discharge Exam: Vitals:   11/12/22 1941 11/13/22 0346  BP: 130/80 (!) 153/85  Pulse: 94 93  Resp: 18 17  Temp: 98.6 F (37 C) 98.8 F (37.1 C)  SpO2: 96% 94%   Vitals:   11/12/22 1106 11/12/22 1413 11/12/22 1941 11/13/22 0346  BP:  107/71 130/80 (!) 153/85  Pulse: 89 89 94 93  Resp: 18 16 18 17   Temp:  98.2 F (36.8 C) 98.6 F (37 C) 98.8 F (37.1 C)  TempSrc:  Oral Oral Oral  SpO2: 93% 99% 96% 94%  Weight:      Height:        General: Pt is alert, awake, not in acute distress Cardiovascular: RRR, S1/S2 +, no rubs, no gallops Respiratory: CTA bilaterally, no wheezing, no rhonchi Abdominal: Soft, NT, ND, bowel sounds + Extremities: no edema, no cyanosis    The results of  significant diagnostics from this hospitalization (including imaging, microbiology, ancillary and laboratory) are listed below for reference.     Microbiology: No results found for this or any previous visit (from the past 240 hour(s)).   Labs: BNP (last 3 results) Recent Labs    06/28/22 1136  BNP 53.0   Basic Metabolic Panel: Recent Labs  Lab 11/08/22 1505 11/11/22 1047 11/12/22 0520  NA 140 140 143  K 4.2 4.2 4.0  CL 102 107 106  CO2 30 26 26   GLUCOSE 97 99 105*  BUN 34* 30* 30*  CREATININE 1.75* 1.75* 1.76*  CALCIUM 9.4 8.7* 8.5*   Liver Function Tests: Recent Labs  Lab 11/08/22 1505 11/11/22 1047 11/12/22 0520  AST 19 17 18   ALT 11 14 14   ALKPHOS 54 52 49  BILITOT 0.8 1.2 1.1  PROT 6.5 5.9* 5.5*  ALBUMIN 3.9 3.3* 3.1*   No results for input(s): "LIPASE", "AMYLASE" in the last 168 hours. No results for input(s): "AMMONIA" in the last 168 hours. CBC: Recent Labs  Lab 11/08/22 1505  11/11/22 1047 11/12/22 0520 11/13/22 0458  WBC 8.3 8.5 7.9 8.3  NEUTROABS 5.8 5.9  --  5.9  HGB 12.7* 11.8* 10.8* 10.3*  HCT 39.8 37.1* 34.2* 32.7*  MCV 95.6 98.4 99.7 99.1  PLT 247.0 242 231 221   Cardiac Enzymes: No results for input(s): "CKTOTAL", "CKMB", "CKMBINDEX", "TROPONINI" in the last 168 hours. BNP: Invalid input(s): "POCBNP" CBG: No results for input(s): "GLUCAP" in the last 168 hours. D-Dimer No results for input(s): "DDIMER" in the last 72 hours. Hgb A1c No results for input(s): "HGBA1C" in the last 72 hours. Lipid Profile No results for input(s): "CHOL", "HDL", "LDLCALC", "TRIG", "CHOLHDL", "LDLDIRECT" in the last 72 hours. Thyroid function studies Recent Labs    11/12/22 0520  TSH 3.314   Anemia work up No results for input(s): "VITAMINB12", "FOLATE", "FERRITIN", "TIBC", "IRON", "RETICCTPCT" in the last 72 hours. Urinalysis    Component Value Date/Time   COLORURINE YELLOW 04/25/2022 1507   APPEARANCEUR CLEAR 04/25/2022 1507   LABSPEC 1.020 04/25/2022 1507   PHURINE 6.5 04/25/2022 1507   GLUCOSEU NEGATIVE 04/25/2022 1507   HGBUR NEGATIVE 04/25/2022 1507   BILIRUBINUR NEGATIVE 04/25/2022 1507   KETONESUR NEGATIVE 04/25/2022 1507   PROTEINUR TRACE (A) 11/22/2019 1054   UROBILINOGEN 0.2 04/25/2022 1507   NITRITE NEGATIVE 04/25/2022 1507   LEUKOCYTESUR NEGATIVE 04/25/2022 1507   Sepsis Labs Recent Labs  Lab 11/08/22 1505 11/11/22 1047 11/12/22 0520 11/13/22 0458  WBC 8.3 8.5 7.9 8.3   Microbiology No results found for this or any previous visit (from the past 240 hour(s)).   Time coordinating discharge: 32 minutes  SIGNED:   Dorcas Carrow, MD  [facility] 11/13/2022, 11:06 AM

## 2022-11-13 NOTE — Progress Notes (Signed)
Gastroenterology Inpatient Follow-up Note   PATIENT IDENTIFICATION  Stephen Wilkins is a 89 y.o. male Hospital Day: 3  SUBJECTIVE  The patient's chart has been reviewed. The patient's labs have been reviewed.  Hemogram is stable. Today the patient is feeling well.  He had breakfast and his wife is also with him eating breakfast. Patient denies any abdominal pain or discomfort. No significant bleeding noted after his procedure. He is wondering if he will be able to go to discharge today.   OBJECTIVE  Scheduled Inpatient Medications:   feeding supplement  1 Container Oral TID BM   rosuvastatin  10 mg Oral QHS   tamsulosin  0.4 mg Oral QHS   Continuous Inpatient Infusions:  PRN Inpatient Medications: acetaminophen **OR** acetaminophen, ondansetron **OR** ondansetron (ZOFRAN) IV   Physical Examination  Temp:  [97.5 F (36.4 C)-98.8 F (37.1 C)] 98.8 F (37.1 C) (08/11 0346) Pulse Rate:  [81-100] 93 (08/11 0346) Resp:  [12-22] 17 (08/11 0346) BP: (100-173)/(56-104) 153/85 (08/11 0346) SpO2:  [91 %-100 %] 94 % (08/11 0346) Weight:  [80.9 kg] 80.9 kg (08/10 0933) Temp (24hrs), Avg:98.1 F (36.7 C), Min:97.5 F (36.4 C), Max:98.8 F (37.1 C)  Weight: 80.9 kg GEN: NAD, appears stated age, doesn't appear chronically ill PSYCH: Cooperative, without pressured speech EYE: Conjunctivae pink, sclerae anicteric ENT: MMM, without oral ulcers, no erythema or exudates noted NECK: Supple CV: RR without R/Gs  RESP: CTAB posteriorly, without wheezing GI: NABS, soft, NT/ND, without rebound or guarding, no HSM appreciated GU: DRE shows MSK/EXT: _ edema, no palmar erythema SKIN: No jaundice, no spider angiomata, no concerning rashes NEURO:  Alert & Oriented x 3, no focal deficits, no evidence of asterixis   Review of Data   Laboratory Studies   Recent Labs  Lab 11/12/22 0520  NA 143  K 4.0  CL 106  CO2 26  BUN 30*  CREATININE 1.76*  GLUCOSE 105*  CALCIUM 8.5*   Recent  Labs  Lab 11/12/22 0520  AST 18  ALT 14  ALKPHOS 49    Recent Labs  Lab 11/11/22 1047 11/12/22 0520 11/13/22 0458  WBC 8.5 7.9 8.3  HGB 11.8* 10.8* 10.3*  HCT 37.1* 34.2* 32.7*  PLT 242 231 221   Recent Labs  Lab 11/12/22 0712  INR 1.1   Imaging Studies   DG Abd 1 View  Result Date: 11/12/2022 CLINICAL DATA:  [id number] Foreign body alimentary tract [id number] EXAM: ABDOMEN - 1 VIEW COMPARISON:  11/11/2022 FINDINGS: A surgical clip is located at the peripheral aspect of the lower left abdomen. The bowel gas pattern is normal. No radio-opaque calculi or other significant radiographic abnormality are seen. IMPRESSION: Surgical clip located at the peripheral aspect of the lower left abdomen. Electronically Signed   By: Duanne Guess D.O.   On: 11/12/2022 14:56   CT ABDOMEN PELVIS W CONTRAST  Result Date: 11/11/2022 CLINICAL DATA:  Abdominal pain.  Blood per rectum. EXAM: CT ABDOMEN AND PELVIS WITH CONTRAST TECHNIQUE: Multidetector CT imaging of the abdomen and pelvis was performed using the standard protocol following bolus administration of intravenous contrast. RADIATION DOSE REDUCTION: This exam was performed according to the departmental dose-optimization program which includes automated exposure control, adjustment of the mA and/or kV according to patient size and/or use of iterative reconstruction technique. CONTRAST:  75mL OMNIPAQUE IOHEXOL 300 MG/ML  SOLN COMPARISON:  CT 12/27/2018 abdomen pelvis FINDINGS: Lower chest: There is some linear opacity lung bases likely scar or atelectasis. No pleural effusion. Stable  tiny 3 mm nodule medial right lung base on series 6, image 26 demonstrating long-term stability. No specific imaging follow-up. Mild lung base bronchial wall thickening. Hepatobiliary: Layering stones within the gallbladder. Gallbladder itself is nondilated. There is a benign-appearing cysts seen centrally along the liver on series 2, image 22 measuring 15 mm. Unchanged from prior  demonstrating long-term stability. Patent portal vein. Pancreas: There is some pancreatic atrophy identified. There are several punctate calcifications in the head/uncinate process of the pancreas. Please correlate for any history such as chronic calcific pancreatitis. These were seen previously. No obvious pancreatic mass. Spleen: Normal in size without focal abnormality. Adrenals/Urinary Tract: Adrenal glands are preserved. There is moderate atrophy of the kidneys. Exophytic Bosniak 1 right-sided renal cysts identified measuring up to 4.1 cm in diameter with Hounsfield unit 9. There are some smaller Bosniak 1 and 2 lesions elsewhere bilaterally. There are punctate bilateral nonobstructing renal stones. The ureters have normal course and caliber extending down to the bladder. Preserved contours of the urinary bladder. Stomach/Bowel: Moderate colonic stool. Large bowel is of normal course and caliber. Left-sided colonic diverticula. Normal appendix. Stomach is underdistended. Small bowel is nondilated. Few small bowel diverticula identified along the ileum. Vascular/Lymphatic: Diffuse partially calcified atherosclerotic plaque. There are several areas of irregular soft plaque along the abdominal aorta. Dilatation of the common iliac arteries on the right measuring up to 17 mm and left 14 mm. Areas of stenosis as well along the internal iliac vessels. No specific abnormal lymph node enlargement identified in the abdomen and pelvis. Reproductive: Prominent prostate. Other: No free air or free fluid. Musculoskeletal: Curvature and degenerative changes along the spine. IMPRESSION: No bowel obstruction, free air or free fluid. Normal appendix. Moderate colonic stool. Scattered bowel diverticula. Gallstones. Bilateral renal atrophy with a benign cysts and nonobstructing renal stones. Diffuse atherosclerotic changes identified including areas of irregular soft plaque along the abdominal aorta. Electronically Signed   By:  Karen Kays M.D.   On: 11/11/2022 14:10    GI Procedures and Studies  Colonoscopy - Preparation of the colon was fair even after copious lavage. - Hemorrhoids found on digital rectal exam. - Blood clot in the sigmoid colon and in the descending colon. - Ruben stool in the rest of examined colon. - One 20 mm polyp at the ileocecal valve, removed with underwater cold piecemeal mucosal resection. Resected and retrieved. - Six, 3 to 6 mm polyps in the rectum, in the descending colon, in the transverse colon and at the hepatic flexure, removed with a cold snare. Resected and retrieved. - Diverticulum in the sigmoid colon at 31 cm from anal os with a blood clot. Clip (MR conditional) was placed. Clip manufacturer: [employer]. - Diverticulosis in the recto-sigmoid colon, in the sigmoid colon and in the descending colon. - Non-bleeding non-thrombosed external and internal hemorrhoids.   ASSESSMENT  Mr. Konrath is a 89 y.o. male with a pmh significant for asthma/COPD, thoracic aortic aneurysm, status postcholecystectomy with previous choledocholithiasis, GERD, diverticulosis, colon polyps.  Patient admitted with rectal bleeding and unintentional weight loss now status post colonoscopy with findings most likely due to diverticular bleed no colonic mass.  The patient is hemodynamically and clinically well.  I believe that the etiology of his bleeding was most likely diverticular in origin.  Preparation was only fair but at his age even with the multiple polyps as well as the larger piecemeal resection, I am not sure that repeat colonoscopic evaluation is required unless there is something on pathology  that returns.  I would recommend intravenous iron while in-house if possible.  Close monitoring of his blood counts by PCP and follow-up makes sense.  If there are further issues, he can be seen back in the [facility] office if needed.  We are going to sign off today.    PLAN/RECOMMENDATIONS  Trend Hgb/Hct  while inhouse Would consider IV Iron while inhouse x 1 dose if possible If patient continues to have issues as outpatient then followup with [facility] but nothing indicated at this time   Please page/call with questions or concerns.   Corliss Parish, MD  Gastroenterology [facility] Office # [phone]    LOS: 1 day  Lemar Lofty  11/13/2022, 7:11 AM

## 2022-11-13 NOTE — Plan of Care (Signed)
  Problem: Clinical Measurements: Goal: Ability to maintain clinical measurements within normal limits will improve Outcome: Adequate for Discharge Goal: Will remain free from infection Outcome: Adequate for Discharge Goal: Diagnostic test results will improve Outcome: Adequate for Discharge Goal: Respiratory complications will improve Outcome: Adequate for Discharge Goal: Cardiovascular complication will be avoided Outcome: Adequate for Discharge   Problem: Health Behavior/Discharge Planning: Goal: Ability to manage health-related needs will improve Outcome: Adequate for Discharge   Problem: Education: Goal: Knowledge of General Education information will improve Description: Including pain rating scale, medication(s)/side effects and non-pharmacologic comfort measures Outcome: Adequate for Discharge   Problem: Activity: Goal: Risk for activity intolerance will decrease Outcome: Adequate for Discharge   Problem: Nutrition: Goal: Adequate nutrition will be maintained Outcome: Adequate for Discharge   Problem: Coping: Goal: Level of anxiety will decrease Outcome: Adequate for Discharge   Problem: Elimination: Goal: Will not experience complications related to bowel motility Outcome: Adequate for Discharge Goal: Will not experience complications related to urinary retention Outcome: Adequate for Discharge   Problem: Pain Managment: Goal: General experience of comfort will improve Outcome: Adequate for Discharge   Problem: Safety: Goal: Ability to remain free from injury will improve Outcome: Adequate for Discharge   Problem: Skin Integrity: Goal: Risk for impaired skin integrity will decrease Outcome: Adequate for Discharge

## 2022-11-13 NOTE — Progress Notes (Signed)
1145 patient discharged reviewed with him and his wife

## 2022-11-13 NOTE — TOC Initial Note (Signed)
Transition of Care [facility]) - Initial/Assessment Note    Patient Details  Name: Stephen Wilkins MRN: [id number] Date of Birth: [birth year]  Transition of Care [facility]) CM/SW Contact:    Adrian Prows, RN Phone Number: 11/13/2022, 10:24 AM  Clinical Narrative:                [facility] consult for d/c planning; spoke w/ pt and wife/POC Buddy Duty [phone]) in room; pt says he is from home and plans to return at d/c; he denies SDOH risks, and he has transportation; pt says he has glasses, dentures (upper/lower), and bil HAs; pt says he has cane, walker, transport chair, and grab bars in shower; he does not have HH services or home oxygen; pt verifed ins and PCP; no TOC needs.  Expected Discharge Plan: Home/Self Care Barriers to Discharge: No Barriers Identified   Patient Goals and CMS Choice Patient states their goals for this hospitalization and ongoing recovery are:: home          Expected Discharge Plan and Services   Discharge Planning Services: CM Consult   Living arrangements for the past 2 months: Single Family Home                                      Prior Living Arrangements/Services Living arrangements for the past 2 months: Single Family Home Lives with:: Spouse Patient language and need for interpreter reviewed:: Yes Do you feel safe going back to the place where you live?: Yes      Need for Family Participation in Patient Care: Yes (Comment) Care giver support system in place?: Yes (comment) Current home services: DME (cane, walker, transport chair) Criminal Activity/Legal Involvement Pertinent to Current Situation/Hospitalization: No - Comment as needed  Activities of Daily Living Home Assistive Devices/Equipment: Raised toilet seat with rails, Walker (specify type), Other (Comment) (Cane) ADL Screening (condition at time of admission) Patient's cognitive ability adequate to safely complete daily activities?: Yes Is the patient deaf or have  difficulty hearing?: No Does the patient have difficulty seeing, even when wearing glasses/contacts?: No Does the patient have difficulty concentrating, remembering, or making decisions?: Yes Patient able to express need for assistance with ADLs?: Yes Does the patient have difficulty dressing or bathing?: No Independently performs ADLs?: Yes (appropriate for developmental age) Does the patient have difficulty walking or climbing stairs?: Yes Weakness of Legs: Both Weakness of Arms/Hands: Both  Permission Sought/Granted Permission sought to share information with : Case Manager Permission granted to share information with : Yes, Verbal Permission Granted  Share Information with NAME: Case Manager     Permission granted to share info w Relationship: Zahir Mominee (spouse) [phone]     Emotional Assessment Appearance:: Appears stated age Attitude/Demeanor/Rapport: Gracious Affect (typically observed): Accepting Orientation: : Oriented to Self, Oriented to Place, Oriented to  Time, Oriented to Situation Alcohol / Substance Use: Not Applicable Psych Involvement: No (comment)  Admission diagnosis:  Acute GI bleeding [K92.2] Lower GI bleed [K92.2] Lower GI bleeding [K92.2] Patient Active Problem List   Diagnosis Date Noted   Lower GI bleeding 11/12/2022   Lower GI bleed 11/11/2022   Hematochezia 11/11/2022   Unintentional weight loss 11/11/2022   Chronic constipation 11/11/2022   Acute on chronic anemia 11/11/2022   Acute GI bleeding 11/11/2022   OAB (overactive bladder) 11/09/2022   Lesion of ear 11/09/2022   Epithelial (juvenile) corneal dystrophy, 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  Shortness of breath 08/02/2022   Thoracic aortic aneurysm without rupture (HCC) 08/02/2022   Asthmatic bronchitis 08/02/2022   Acute hypoxemic respiratory failure (HCC) 06/28/2022   Severe persistent asthma with exacerbation 06/05/2022   Unspecified corneal edema 06/02/2022   Asthma 04/25/2022   Benign  prostatic hyperplasia without urinary obstruction 04/25/2022   Hearing loss 04/25/2022   HTN (hypertension) 04/25/2022   Mixed hyperlipidemia 04/25/2022   Encounter for fitting and adjustment of hearing aid 04/25/2022   Encounter for well adult exam with abnormal findings 04/25/2022   Urinary frequency 10/24/2021   Heart murmur 10/24/2021   Benign prostatic hyperplasia with urinary frequency 10/24/2021   Low energy 10/21/2021   Vitamin D deficiency 04/16/2021   Pain in joint of left shoulder 01/23/2020   Bilateral shoulder pain 11/22/2019   Myalgia 11/22/2019   Gait disorder 11/22/2019   Chronic pain of right thumb 05/17/2019   Primary osteoarthritis of first carpometacarpal joint of right hand 05/17/2019   Trigger finger of right thumb 05/17/2019   Corneal epithelial basement membrane dystrophy 03/04/2019   Keratoconjunctivitis sicca of both eyes not specified as Sjogren's 03/04/2019   Posterior vitreous detachment of both eyes 03/04/2019   Presbyopia of both eyes 03/04/2019   Pseudophakia of both eyes 03/04/2019   Febrile illness 12/27/2018   Choledocholithiasis 12/27/2018   Low back pain 07/11/2017   Non compliance w medication regimen 08/23/2016   Peripheral neuropathy 08/23/2016   COPD (chronic obstructive pulmonary disease) (HCC) 04/19/2016   Hyperglycemia 02/10/2016   Preventative health care 07/29/2015   CAP (community acquired pneumonia) 05/30/2015   Abnormal CXR 05/30/2015   Cough 04/22/2015   Asthma with exacerbation 04/22/2015   Urine abnormality 04/22/2015   Abdominal pain 09/11/2014   Abnormal liver function test 09/11/2014   Dysphagia 07/29/2014   Anxiety state 09/06/2013   CKD (chronic kidney disease) 09/06/2013   Hypothyroidism 09/06/2013   Rash and nonspecific skin eruption 02/27/2013   Dizziness and giddiness 01/04/2013   Abnormal TSH 06/04/2012   Gross hematuria 01/09/2012   Bruising 06/05/2011   Dementia (HCC) 09/01/2010   Cough variant asthma vs  UACS  01/14/2010   ERECTILE DYSFUNCTION, ORGANIC 04/17/2009   FATIGUE 06/27/2008   PSA, INCREASED 06/27/2008   SCIATICA, RIGHT 04/28/2008   SCHATZKI'S RING 07/18/2007   HIATAL HERNIA 07/18/2007   Schatzki's ring 07/18/2007   ABNORMAL ELECTROCARDIOGRAM 06/21/2007   Hyperlipidemia 03/23/2007   Essential hypertension 03/23/2007   Allergic rhinitis 03/23/2007   GERD 03/23/2007   BENIGN PROSTATIC HYPERTROPHY 03/23/2007   NEPHROLITHIASIS, HX OF 03/23/2007   PCP:  Corwin Levins, MD Pharmacy:   [facility] [address], [place] - [address] [place] [address] Phone: [phone] Fax: [phone]     Social Determinants of Health (SDOH) Social History: SDOH Screenings   Food Insecurity: No Food Insecurity (11/13/2022)  Housing: Patient Declined (11/13/2022)  Transportation Needs: No Transportation Needs (11/13/2022)  Utilities: Not At Risk (11/13/2022)  Alcohol Screen: Low Risk  (09/22/2022)  Depression (PHQ2-9): Low Risk  (11/08/2022)  Financial Resource Strain: Low Risk  (09/22/2022)  Physical Activity: Inactive (09/22/2022)  Social Connections: Socially Integrated (09/22/2022)  Stress: No Stress Concern Present (09/22/2022)  Tobacco Use: Low Risk  (11/11/2022)   SDOH Interventions: Food Insecurity Interventions: Intervention Not Indicated, Inpatient TOC Housing Interventions: Intervention Not Indicated, Inpatient TOC Transportation Interventions: Intervention Not Indicated, Inpatient TOC Utilities Interventions: Intervention Not Indicated, Inpatient TOC   Readmission Risk Interventions    11/13/2022   10:22  AM  Readmission Risk Prevention Plan  Transportation Screening Complete  PCP or Specialist Appt within 5-7 Days Complete  Home Care Screening Complete  Medication Review (RN CM) Complete

## 2022-11-14 ENCOUNTER — Encounter (HOSPITAL_COMMUNITY): Payer: Self-pay | Admitting: Gastroenterology

## 2022-11-14 ENCOUNTER — Telehealth: Payer: Self-pay | Admitting: Internal Medicine

## 2022-11-14 NOTE — Telephone Encounter (Signed)
Patients wife wants to know if he is on a blood pressure pill?  What is it?  Please call her at [phone]

## 2022-11-15 ENCOUNTER — Encounter: Payer: Self-pay | Admitting: *Deleted

## 2022-11-15 ENCOUNTER — Telehealth: Payer: Self-pay | Admitting: *Deleted

## 2022-11-15 LAB — SURGICAL PATHOLOGY

## 2022-11-15 NOTE — Telephone Encounter (Signed)
Called pt no answer LMOM w/ MD response..Stephen Wilkins

## 2022-11-15 NOTE — Transitions of Care (Post Inpatient/ED Visit) (Signed)
   11/15/2022  Name: Stephen Wilkins MRN: [id number] DOB: 15-Dec-1927  Today's TOC FU Call Status: Today's TOC FU Call Status:: Unsuccessful Call (1st Attempt) Unsuccessful Call (1st Attempt) Date: 11/15/22  Attempted to reach the patient regarding the most recent Inpatient visit; left HIPAA compliant voice message requesting call back-- on alternate phone number listed in EHR: first attempt to preferred number-- call rang without physical/ voice mail pick up  Follow Up Plan: Additional outreach attempts will be made to reach the patient to complete the Transitions of Care (Post Inpatient visit) call.   Caryl Pina, RN, BSN, CCRN Alumnus RN CM Care Coordination/ Transition of Care- [facility] Care Management [phone]: direct office

## 2022-11-15 NOTE — Telephone Encounter (Signed)
No, ok to stay off both meds for now, and this can be reveiwed at next vist

## 2022-11-15 NOTE — Telephone Encounter (Signed)
Called pt/wife she states he want to verify if he suppose to be taking Losartan 100 mg . Not on pt list also she want to know does he need to continue taking Amlodipine../l,mb

## 2022-11-16 ENCOUNTER — Telehealth: Payer: Self-pay | Admitting: *Deleted

## 2022-11-16 ENCOUNTER — Encounter: Payer: Self-pay | Admitting: *Deleted

## 2022-11-16 ENCOUNTER — Encounter: Payer: Self-pay | Admitting: Gastroenterology

## 2022-11-16 NOTE — Transitions of Care (Post Inpatient/ED Visit) (Signed)
11/16/2022  Name: Stephen Wilkins MRN: [id number] DOB: 08-01-1927  Today's TOC FU Call Status: Today's TOC FU Call Status:: Successful TOC FU Call Completed TOC FU Call Complete Date: 11/16/22  Transition Care Management Follow-up Telephone Call Date of Discharge: 11/13/22 Discharge Facility: [facility]) Type of Discharge: Inpatient Admission Primary Inpatient Discharge Diagnosis:: Lower GI bleeding with colonoscopy and clip placement for clot How have you been since you were released from the hospital?: Better (per spouse: "He is doing real good; thanks for helping me get this appointment with Dr. Jonny Ruiz- I can't talk very long, I am on my way out the door for a doctors appointment for myself") Any questions or concerns?: Yes Patient Questions/Concerns:: Unsure whether or not patient should be taking blood pressure medications: states she called the PCP office and has not yet heard back Patient Questions/Concerns Addressed: Other: (confirmed by review of EHR that patient's question was addressed by PCP staff- updated her according to Dr. Raphael Gibney note that she should hold BP medications Losartan and amlodipine until he is seen in person by PCP at [facility]; facilitated scheduling HFU OV)  Items Reviewed: Did you receive and understand the discharge instructions provided?: Yes Medications obtained,verified, and reconciled?: Partial Review Completed (spouse manages medications; clarified for her questions about BP medication, per PCP advice provided in previous note on 11/15/22; she denies additional questions/ concerns around medications today) Reason for Partial Mediation Review: spouse declined- time constraints- she is leaving home for doctor appointment for herself; Any new allergies since your discharge?: No Dietary orders reviewed?: No Do you have support at home?: Yes People in Home: spouse Name of Support/Comfort Primary Source: Supportive spouse [name] assists as/ if needed/ indicated  with all care needs- patient has dementia  Medications Reviewed Today: Medications Reviewed Today     Reviewed by Michaela Corner, RN (Registered Nurse) on 11/16/22 at 1102  Med List Status: <None>   Medication Order Taking? Sig Documenting Provider Last Dose Status Informant  albuterol (PROVENTIL) (2.5 MG/3ML) 0.083% nebulizer solution [id number] No Take 3 mLs (2.5 mg total) by nebulization every 6 (six) hours as needed for wheezing or shortness of breath. Corwin Levins, MD 11/10/2022 Active Self, Spouse/Significant Other, Pharmacy Records           Med Note Otelia Limes Nov 16, 2022 11:02 AM) 11/16/22: Spouse declined full medication review during TOC call- time constraints  albuterol (PROVENTIL) (2.5 MG/3ML) 0.083% nebulizer solution 2.5 mg [id number]   Corwin Levins, MD  Active   albuterol (VENTOLIN HFA) 108 (90 Base) MCG/ACT inhaler [id number] No INHALE 2 PUFFS BY MOUTH EVERY 6 HOURS AS NEEDED FOR WHEEZING FOR SHORTNESS OF BREATH  Patient taking differently: Inhale 2 puffs into the lungs every 6 (six) hours as needed for shortness of breath.   Corwin Levins, MD Past Week Active Self, Spouse/Significant Other, Pharmacy Records  ALPRAZolam Prudy Feeler) 0.5 MG tablet [id number] No 1/2 - 1 tab by mouth twice per day as needed Corwin Levins, MD unk Active Self, Spouse/Significant Other, Pharmacy Records  aspirin EC 81 MG tablet [id number] No Take by mouth. [provider] Past Month Active Self, Spouse/Significant Other, Pharmacy Records  budesonide-formoterol [facility]) 80-4.5 MCG/ACT inhaler [id number] No Inhale 2 puffs into the lungs daily as needed (shortness of breath). [provider] unk Active Self, Spouse/Significant Other, Pharmacy Records  Cholecalciferol (VITAMIN D) 1000 UNITS capsule [id number] No Take 1,000 Units by mouth every other day.  [provider]  Past Week Active Self, Spouse/Significant Other, Pharmacy Records  Cholecalciferol 50 MCG (2000 UT) TABS  [id number] No 1 tab by mouth once daily  Patient taking differently: Take 1 tablet by mouth daily.   Corwin Levins, MD Past Week Active Self, Spouse/Significant Other, Pharmacy Records  cyanocobalamin 50 MCG tablet [id number] No Take 100 mcg by mouth daily. [provider] Past Week Active Self, Spouse/Significant Other, Pharmacy Records  desoximetasone (TOPICORT) 0.25 % cream [id number] No APPLY SPARINGLY TO THE AFFECTED AREA TWICE DAILY AS NEEDED AS DIRECTED  Patient taking differently: Apply 1 application  topically 2 (two) times daily. Itching   Corwin Levins, MD unk Active Self, Spouse/Significant Other, Pharmacy Records  montelukast (SINGULAIR) 10 MG tablet [id number] No Take 1 tablet (10 mg total) by mouth at bedtime. Corwin Levins, MD Past Week Active Self, Spouse/Significant Other, Pharmacy Records  Multiple Vitamin (MULTIVITAMINS PO) [id number] No Take 1 tablet by mouth daily. [provider] Past Week Active Self, Spouse/Significant Other, Pharmacy Records  Multiple Vitamins-Minerals (CENTRUM SILVER PO) [id number] No Take 1 tablet by mouth daily.  [provider] Past Week Active Self, Spouse/Significant Other, Pharmacy Records  polyethylene glycol powder (MIRALAX) powder [id number] No Take 1/2 capful by mouth once daily  Patient taking differently: Take 17 g by mouth 2 (two) times a week.   Corwin Levins, MD Past Week Active Self, Spouse/Significant Other, Pharmacy Records  rosuvastatin (CRESTOR) 10 MG tablet [id number] No Take 1 tablet by mouth once daily Corwin Levins, MD 11/10/2022 Active Self, Spouse/Significant Other, Pharmacy Records  RSV vaccine recomb adjuvanted (AREXVY) 120 MCG/0.5ML injection [id number] No Inject into the muscle. Judyann Munson, MD Taking Active Self, Spouse/Significant Other, Pharmacy Records  sodium chloride (MURO 128) 5 % ophthalmic solution [id number] No Place 1 drop into both eyes 4 (four) times daily as needed for eye irritation. [provider] 11/10/2022 Active Self, Spouse/Significant Other, Pharmacy Records  solifenacin (VESICARE) 5 MG tablet [id number] No Take 1 tablet (5 mg total) by mouth daily. Corwin Levins, MD 11/10/2022 Active Self, Spouse/Significant Other, Pharmacy Records  tamsulosin (FLOMAX) 0.4 MG CAPS capsule [id number] No TAKE 1 CAPSULE(0.4 MG) BY MOUTH AT BEDTIME  Patient taking differently: Take 0.4 mg by mouth at bedtime.   Corwin Levins, MD 11/10/2022 Active Self, Spouse/Significant Other, Pharmacy Records  vitamin B-12 (CYANOCOBALAMIN) 100 MCG tablet [id number] No Take 100 mcg by mouth once a week. [provider] Past Week Active Self, Spouse/Significant Other, Pharmacy Records  vitamin C (ASCORBIC ACID) 500 MG tablet [id number] No Take 1,000 mg by mouth daily.  [provider] Past Week Active Self, Spouse/Significant Other, Pharmacy Records           Home Care and Equipment/Supplies: Were Home Health Services Ordered?: No Any new equipment or medical supplies ordered?: No  Functional Questionnaire: Do you need assistance with bathing/showering or dressing?: Yes (wife assists with all care needs as indicated) Do you need assistance with meal preparation?: Yes (wife assists with all care needs as indicated) Do you need assistance with eating?: No Do you have difficulty maintaining continence: Yes (wife assists with all care needs as indicated) Do you need assistance with getting out of bed/getting out of a chair/moving?: Yes (wife assists with all care needs as indicated) Do you have difficulty managing or taking your medications?: Yes (wife assists with all care needs as indicated)  Follow up appointments reviewed: PCP Follow-up appointment confirmed?: Yes (care coordination outreach in real-time with scheduling care guide  to successfully schedule hospital follow up PCP appointment 11/22/22) Date of PCP follow-up appointment?: 11/22/22 Follow-up Provider: PCP Specialist Hospital  Follow-up appointment confirmed?: No Reason Specialist Follow-Up Not Confirmed: Patient has Specialist Provider Number and will Call for Appointment Do you need transportation to your follow-up appointment?: No Do you understand care options if your condition(s) worsen?: Yes-patient verbalized understanding  SDOH Interventions Today    Flowsheet Row Most Recent Value  SDOH Interventions   Transportation Interventions Intervention Not Indicated  [spouse provides transportation]      TOC Interventions Today    Flowsheet Row Most Recent Value  TOC Interventions   TOC Interventions Discussed/Reviewed TOC Interventions Discussed, Arranged PCP follow up less than 12 days/Care Guide scheduled      Interventions Today    Flowsheet Row Most Recent Value  Chronic Disease   Chronic disease during today's visit Other  [Lower GI bleeding]  General Interventions   General Interventions Discussed/Reviewed General Interventions Discussed, Doctor Visits  Doctor Visits Discussed/Reviewed Doctor Visits Discussed, PCP  PCP/Specialist Visits Compliance with follow-up visit  Pharmacy Interventions   Pharmacy Dicussed/Reviewed Pharmacy Topics Discussed      Caryl Pina, RN, BSN, CCRN Alumnus RN CM Care Coordination/ Transition of Care- [facility] Care Management [phone]: direct office

## 2022-11-16 NOTE — Telephone Encounter (Signed)
Patient's wife [name] returned Lucy's call. She would like a call back at [phone].

## 2022-11-16 NOTE — Telephone Encounter (Signed)
Called wife back and left voicemail.

## 2022-11-17 ENCOUNTER — Telehealth: Payer: Self-pay | Admitting: Gastroenterology

## 2022-11-17 NOTE — Telephone Encounter (Signed)
The pt has not had a BM since colon 5 days ago.  I have advised that it can take a few days to get back to normal bowel habits.  He took 1 dose of miralax today and will take 1 dose daily until his begins to have BM's.  He will increase water intake as well.  He will call back if this does not work.

## 2022-11-17 NOTE — Telephone Encounter (Signed)
PT and wife called because since emergency colonoscopy 8/10 he has not had a BM and they are concerned. Please advise.

## 2022-11-22 ENCOUNTER — Inpatient Hospital Stay: Payer: No Typology Code available for payment source | Admitting: Internal Medicine

## 2022-11-22 DIAGNOSIS — D485 Neoplasm of uncertain behavior of skin: Secondary | ICD-10-CM | POA: Diagnosis not present

## 2022-11-22 DIAGNOSIS — L821 Other seborrheic keratosis: Secondary | ICD-10-CM | POA: Diagnosis not present

## 2022-11-22 DIAGNOSIS — C44212 Basal cell carcinoma of skin of right ear and external auricular canal: Secondary | ICD-10-CM | POA: Diagnosis not present

## 2022-11-29 ENCOUNTER — Encounter: Payer: Self-pay | Admitting: Internal Medicine

## 2022-11-29 ENCOUNTER — Ambulatory Visit (INDEPENDENT_AMBULATORY_CARE_PROVIDER_SITE_OTHER): Payer: PPO | Admitting: Internal Medicine

## 2022-11-29 VITALS — BP 136/86 | HR 85 | Temp 98.0°F | Ht 72.0 in | Wt 177.0 lb

## 2022-11-29 DIAGNOSIS — K219 Gastro-esophageal reflux disease without esophagitis: Secondary | ICD-10-CM

## 2022-11-29 DIAGNOSIS — H9390 Unspecified disorder of ear, unspecified ear: Secondary | ICD-10-CM | POA: Diagnosis not present

## 2022-11-29 DIAGNOSIS — K922 Gastrointestinal hemorrhage, unspecified: Secondary | ICD-10-CM | POA: Diagnosis not present

## 2022-11-29 DIAGNOSIS — D649 Anemia, unspecified: Secondary | ICD-10-CM

## 2022-11-29 DIAGNOSIS — R42 Dizziness and giddiness: Secondary | ICD-10-CM

## 2022-11-29 LAB — HEPATIC FUNCTION PANEL
ALT: 10 U/L (ref 0–53)
AST: 18 U/L (ref 0–37)
Albumin: 3.9 g/dL (ref 3.5–5.2)
Alkaline Phosphatase: 61 U/L (ref 39–117)
Bilirubin, Direct: 0.2 mg/dL (ref 0.0–0.3)
Total Bilirubin: 1 mg/dL (ref 0.2–1.2)
Total Protein: 6.5 g/dL (ref 6.0–8.3)

## 2022-11-29 LAB — CBC WITH DIFFERENTIAL/PLATELET
Basophils Absolute: 0 10*3/uL (ref 0.0–0.1)
Basophils Relative: 0.5 % (ref 0.0–3.0)
Eosinophils Absolute: 0.3 10*3/uL (ref 0.0–0.7)
Eosinophils Relative: 3.2 % (ref 0.0–5.0)
HCT: 36.5 % — ABNORMAL LOW (ref 39.0–52.0)
Hemoglobin: 12 g/dL — ABNORMAL LOW (ref 13.0–17.0)
Lymphocytes Relative: 22 % (ref 12.0–46.0)
Lymphs Abs: 1.9 10*3/uL (ref 0.7–4.0)
MCHC: 32.9 g/dL (ref 30.0–36.0)
MCV: 95.5 fl (ref 78.0–100.0)
Monocytes Absolute: 0.7 10*3/uL (ref 0.1–1.0)
Monocytes Relative: 7.6 % (ref 3.0–12.0)
Neutro Abs: 5.8 10*3/uL (ref 1.4–7.7)
Neutrophils Relative %: 66.7 % (ref 43.0–77.0)
Platelets: 280 10*3/uL (ref 150.0–400.0)
RBC: 3.82 Mil/uL — ABNORMAL LOW (ref 4.22–5.81)
RDW: 13.2 % (ref 11.5–15.5)
WBC: 8.6 10*3/uL (ref 4.0–10.5)

## 2022-11-29 LAB — BASIC METABOLIC PANEL
BUN: 26 mg/dL — ABNORMAL HIGH (ref 6–23)
CO2: 29 mEq/L (ref 19–32)
Calcium: 9.4 mg/dL (ref 8.4–10.5)
Chloride: 103 mEq/L (ref 96–112)
Creatinine, Ser: 1.68 mg/dL — ABNORMAL HIGH (ref 0.40–1.50)
GFR: 35 mL/min — ABNORMAL LOW (ref >60.00)
Glucose, Bld: 96 mg/dL (ref 70–99)
Potassium: 4.4 mEq/L (ref 3.5–5.1)
Sodium: 142 mEq/L (ref 135–145)

## 2022-11-29 MED ORDER — PANTOPRAZOLE SODIUM 40 MG PO TBEC: 40.0000 mg | DELAYED_RELEASE_TABLET | Freq: Every day | ORAL | 3 refills | Status: DC

## 2022-11-29 MED ORDER — MECLIZINE HCL 12.5 MG PO TABS: 12.5000 mg | ORAL_TABLET | Freq: Three times a day (TID) | ORAL | 1 refills | Status: DC | PRN

## 2022-11-29 NOTE — Progress Notes (Addendum)
The test results show that your current treatment is OK, as the tests are stable.  Please continue the same plan.  There is no other need for change of treatment or further evaluation based on these results, at this time.  thanks 

## 2022-11-29 NOTE — Assessment & Plan Note (Signed)
Pt to f/u derm for further removal basal cell ca

## 2022-11-29 NOTE — Assessment & Plan Note (Signed)
Ok for [employer] prn

## 2022-11-29 NOTE — Assessment & Plan Note (Signed)
For f/u cbc as above

## 2022-11-29 NOTE — Assessment & Plan Note (Signed)
Mld worsening, for protonix 40 qd

## 2022-11-29 NOTE — Progress Notes (Signed)
Patient ID: Stephen Wilkins, male   DOB: [birth year], 89 y.o.   MRN: [id number]        Chief Complaint: follow up recent GI diverticular bleeding, gerd       HPI:  Stephen Wilkins is a 89 y.o. male here overall doing ok.  Has had mild worsening reflux, but no other abd pain, dysphagia, n/v, bowel change or blood.  Still has some fatigue. Pt denies chest pain, increased sob or doe, wheezing, orthopnea, PND, increased LE swelling, palpitations, dizziness or syncope.   Pt denies polydipsia, polyuria, or new focal neuro s/s.    Pt denies fever, wt loss, night sweats, loss of appetite, or other constitutional symptoms  Has right basal cell ca lesion for removal soon per derm.  Also has recurring positional vertigo  Transitional Care Management elements noted today: 1)  Date of D/C: as above 2)  Medication reconciliation:  done today at end visit 3)  Review of D/C summary or other information:  done today 4)  Review of need for f/u on pending diagnostic tests and treatments:  done today - none 5)  Review of need for Interaction with other providers who will assume or resume care of pt specific problems: done today - for GI f/u soon 6)  Education of patient/family/guardian or caregiver: done today - wife present        Wt Readings from Last 3 Encounters:  11/29/22 177 lb (80.3 kg)  11/12/22 178 lb 5.6 oz (80.9 kg)  11/08/22 178 lb 6.4 oz (80.9 kg)   BP Readings from Last 3 Encounters:  11/29/22 136/86  11/13/22 (!) 167/87  11/08/22 (!) 128/90         Past Medical History:  Diagnosis Date   ABNORMAL ELECTROCARDIOGRAM 06/21/2007   ALLERGIC RHINITIS 03/23/2007   Anxiety state, unspecified 09/06/2013   ASTHMATIC BRONCHITIS, ACUTE 04/27/2007   BENIGN PROSTATIC HYPERTROPHY 03/23/2007   BRONCHITIS NOT SPECIFIED AS ACUTE OR CHRONIC 04/21/2007   BURSITIS, RIGHT HIP 07/31/2008   COPD (chronic obstructive pulmonary disease) (HCC) 04/19/2016   Cough 01/29/2009   Dementia (HCC) 09/01/2010   ERECTILE DYSFUNCTION  03/23/2007   ERECTILE DYSFUNCTION, ORGANIC 04/17/2009   Esophageal stricture    FREQUENCY, URINARY 04/26/2010   GERD 03/23/2007   HIATAL HERNIA    HYPERLIPIDEMIA 03/23/2007   HYPERTENSION 03/23/2007   MILD COGNITIVE IMPAIRMENT SO STATED 05/19/2010   NEPHROLITHIASIS, HX OF 03/23/2007   PSA, INCREASED 06/27/2008   RASH-NONVESICULAR 03/23/2007   REACTIVE AIRWAY DISEASE 01/14/2010   SCHATZKI'S RING    SCIATICA, RIGHT 04/28/2008   Unspecified hypothyroidism 09/06/2013   Wheezing 04/17/2009   Past Surgical History:  Procedure Laterality Date   COLONOSCOPY WITH PROPOFOL N/A 11/12/2022   Procedure: COLONOSCOPY WITH PROPOFOL;  Surgeon: Lemar Lofty., MD;  Location: [facility];  Service: Gastroenterology;  Laterality: N/A;   ERCP N/A 12/28/2018   Procedure: ENDOSCOPIC RETROGRADE CHOLANGIOPANCREATOGRAPHY (ERCP);  Surgeon: Meryl Dare, MD;  Location: [facility];  Service: Endoscopy;  Laterality: N/A;   HEMOSTASIS CLIP PLACEMENT  11/12/2022   Procedure: HEMOSTASIS CLIP PLACEMENT;  Surgeon: Lemar Lofty., MD;  Location: [facility];  Service: Gastroenterology;;   POLYPECTOMY  11/12/2022   Procedure: POLYPECTOMY;  Surgeon: Lemar Lofty., MD;  Location: [facility];  Service: Gastroenterology;;   REMOVAL OF STONES  12/28/2018   Procedure: REMOVAL OF STONES;  Surgeon: Meryl Dare, MD;  Location: [facility];  Service: Endoscopy;;  balloon sweep   SPHINCTEROTOMY  12/28/2018   Procedure: SPHINCTEROTOMY;  Surgeon: Meryl Dare, MD;  Location: [facility];  Service: Endoscopy;;   TONSILLECTOMY      reports that he has never smoked. He has never used smokeless tobacco. He reports that he does not drink alcohol and does not use drugs. family history includes Asthma in his sister; Cancer in an other family member; Emphysema in his brother; Hypertension in an other family member; Lung cancer in his brother. Allergies  Allergen Reactions   Atorvastatin     REACTION:  myalgia   Doxazosin Mesylate     REACTION: dizziness   Hydrocodone Bit-Homatrop Mbr Other (See Comments)    anxiety   Hydrocodone Anxiety   Current Outpatient Medications on File Prior to Visit  Medication Sig Dispense Refill   albuterol (PROVENTIL) (2.5 MG/3ML) 0.083% nebulizer solution Take 3 mLs (2.5 mg total) by nebulization every 6 (six) hours as needed for wheezing or shortness of breath. 150 mL 2   albuterol (VENTOLIN HFA) 108 (90 Base) MCG/ACT inhaler INHALE 2 PUFFS BY MOUTH EVERY 6 HOURS AS NEEDED FOR WHEEZING FOR SHORTNESS OF BREATH (Patient taking differently: Inhale 2 puffs into the lungs every 6 (six) hours as needed for shortness of breath.) 9 g 0   ALPRAZolam (XANAX) 0.5 MG tablet 1/2 - 1 tab by mouth twice per day as needed 60 tablet 2   aspirin EC 81 MG tablet Take by mouth.     budesonide-formoterol (SYMBICORT) 80-4.5 MCG/ACT inhaler Inhale 2 puffs into the lungs daily as needed (shortness of breath).     Cholecalciferol (VITAMIN D) 1000 UNITS capsule Take 1,000 Units by mouth every other day.      Cholecalciferol 50 MCG (2000 UT) TABS 1 tab by mouth once daily (Patient taking differently: Take 1 tablet by mouth daily.) 30 tablet 99   cyanocobalamin 50 MCG tablet Take 100 mcg by mouth daily.     desoximetasone (TOPICORT) 0.25 % cream APPLY SPARINGLY TO THE AFFECTED AREA TWICE DAILY AS NEEDED AS DIRECTED (Patient taking differently: Apply 1 application  topically 2 (two) times daily. Itching) 30 g 1   montelukast (SINGULAIR) 10 MG tablet Take 1 tablet (10 mg total) by mouth at bedtime. 30 tablet 3   Multiple Vitamin (MULTIVITAMINS PO) Take 1 tablet by mouth daily.     Multiple Vitamins-Minerals (CENTRUM SILVER PO) Take 1 tablet by mouth daily.      polyethylene glycol powder (MIRALAX) powder Take 1/2 capful by mouth once daily (Patient taking differently: Take 17 g by mouth 2 (two) times a week.) 850 g 5   rosuvastatin (CRESTOR) 10 MG tablet Take 1 tablet by mouth once daily 90  tablet 3   RSV vaccine recomb adjuvanted (AREXVY) 120 MCG/0.5ML injection Inject into the muscle. 0.5 mL 0   sodium chloride (MURO 128) 5 % ophthalmic solution Place 1 drop into both eyes 4 (four) times daily as needed for eye irritation.     solifenacin (VESICARE) 5 MG tablet Take 1 tablet (5 mg total) by mouth daily. 90 tablet 3   tamsulosin (FLOMAX) 0.4 MG CAPS capsule TAKE 1 CAPSULE(0.4 MG) BY MOUTH AT BEDTIME (Patient taking differently: Take 0.4 mg by mouth at bedtime.) 90 capsule 3   vitamin B-12 (CYANOCOBALAMIN) 100 MCG tablet Take 100 mcg by mouth once a week.     vitamin C (ASCORBIC ACID) 500 MG tablet Take 1,000 mg by mouth daily.      Current Facility-Administered Medications on File Prior to Visit  Medication Dose Route Frequency Provider Last Rate  Last Admin   albuterol (PROVENTIL) (2.5 MG/3ML) 0.083% nebulizer solution 2.5 mg  2.5 mg Nebulization Once Corwin Levins, MD            ROS:  All others reviewed and negative.  Objective        PE:  BP 136/86 (BP Location: Left Arm, Patient Position: Sitting, Cuff Size: Large)   Pulse 85   Temp 98 F (36.7 C) (Oral)   Ht 6' (1.829 m)   Wt 177 lb (80.3 kg)   SpO2 98%   BMI 24.01 kg/m                 Constitutional: Pt appears in NAD               HENT: Head: NCAT.                Right Ear: External ear normal.                 Left Ear: External ear normal.                Eyes: . Pupils are equal, round, and reactive to light. Conjunctivae and EOM are normal               Nose: without d/c or deformity               Neck: Neck supple. Gross normal ROM               Cardiovascular: Normal rate and regular rhythm.                 Pulmonary/Chest: Effort normal and breath sounds without rales or wheezing.                Abd:  Soft, NT, ND, + BS, no organomegaly               Neurological: Pt is alert. At baseline orientation, motor grossly intact               Skin: Skin is warm. No rashes, no other new lesions, LE edema -  none               Psychiatric: Pt behavior is normal without agitation   Micro: none  Cardiac tracings I have personally interpreted today:  none  Pertinent Radiological findings (summarize): none   Lab Results  Component Value Date   WBC 8.6 11/29/2022   HGB 12.0 (L) 11/29/2022   HCT 36.5 (L) 11/29/2022   PLT 280.0 11/29/2022   GLUCOSE 96 11/29/2022   CHOL 212 (H) 11/08/2022   TRIG 146.0 11/08/2022   HDL 63.70 11/08/2022   LDLDIRECT 74.0 04/25/2022   LDLCALC 119 (H) 11/08/2022   ALT 10 11/29/2022   AST 18 11/29/2022   NA 142 11/29/2022   K 4.4 11/29/2022   CL 103 11/29/2022   CREATININE 1.68 (H) 11/29/2022   BUN 26 (H) 11/29/2022   CO2 29 11/29/2022   TSH 3.314 11/12/2022   PSA 3.94 03/12/2014   INR 1.1 11/12/2022   HGBA1C 6.2 11/08/2022   Assessment/Plan:  Stephen Wilkins is a 69 y.o. White or Caucasian [1] male with  has a past medical history of ABNORMAL ELECTROCARDIOGRAM (06/21/2007), ALLERGIC RHINITIS (03/23/2007), Anxiety state, unspecified (09/06/2013), ASTHMATIC BRONCHITIS, ACUTE (04/27/2007), BENIGN PROSTATIC HYPERTROPHY (03/23/2007), BRONCHITIS NOT SPECIFIED AS ACUTE OR CHRONIC (04/21/2007), BURSITIS, RIGHT HIP (07/31/2008), COPD (chronic obstructive pulmonary disease) (HCC) (04/19/2016), Cough (01/29/2009), Dementia (HCC) (09/01/2010), ERECTILE  DYSFUNCTION (03/23/2007), ERECTILE DYSFUNCTION, ORGANIC (04/17/2009), Esophageal stricture, FREQUENCY, URINARY (04/26/2010), GERD (03/23/2007), HIATAL HERNIA, HYPERLIPIDEMIA (03/23/2007), HYPERTENSION (03/23/2007), MILD COGNITIVE IMPAIRMENT SO STATED (05/19/2010), NEPHROLITHIASIS, HX OF (03/23/2007), PSA, INCREASED (06/27/2008), RASH-NONVESICULAR (03/23/2007), REACTIVE AIRWAY DISEASE (01/14/2010), SCHATZKI'S RING, SCIATICA, RIGHT (04/28/2008), Unspecified hypothyroidism (09/06/2013), and Wheezing (04/17/2009).  Acute GI bleeding Clinically stable, for cbc f/u lab  Acute on chronic anemia For f/u cbc as above  Lesion of ear Pt to f/u derm  for further removal basal cell ca  Vertigo Ok for mecliziine prn  GERD Mld worsening, for protonix 40 qd  Followup: Return in about 5 months (around 05/11/2023).  Oliver Barre, MD 11/29/2022 8:24 PM [facility]

## 2022-11-29 NOTE — Assessment & Plan Note (Signed)
Clinically stable, for cbc f/u lab

## 2022-11-29 NOTE — Patient Instructions (Signed)
Please take all new medication as prescribed - the protonix for acid, and meclizine as needed for dizziness  Please continue all other medications as before, and refills have been done if requested.  Please have the pharmacy call with any other refills you may need.  Please keep your appointments with your specialists as you may have planned  Please go to the LAB at the blood drawing area for the tests to be done  You will be contacted by phone if any changes need to be made immediately.  Otherwise, you will receive a letter about your results with an explanation, but please check with MyChart first.

## 2022-12-20 ENCOUNTER — Ambulatory Visit: Payer: No Typology Code available for payment source | Admitting: Allergy & Immunology

## 2023-01-02 DIAGNOSIS — Z85828 Personal history of other malignant neoplasm of skin: Secondary | ICD-10-CM | POA: Diagnosis not present

## 2023-01-02 DIAGNOSIS — C44212 Basal cell carcinoma of skin of right ear and external auricular canal: Secondary | ICD-10-CM | POA: Diagnosis not present

## 2023-01-06 ENCOUNTER — Ambulatory Visit: Payer: No Typology Code available for payment source | Admitting: Nurse Practitioner

## 2023-01-28 ENCOUNTER — Other Ambulatory Visit: Payer: Self-pay | Admitting: Internal Medicine

## 2023-01-30 ENCOUNTER — Other Ambulatory Visit: Payer: Self-pay

## 2023-03-19 ENCOUNTER — Encounter: Payer: Self-pay | Admitting: Internal Medicine

## 2023-04-07 ENCOUNTER — Ambulatory Visit: Payer: Self-pay | Admitting: Internal Medicine

## 2023-04-07 NOTE — Telephone Encounter (Signed)
  Chief Complaint: Shortness of breath Symptoms: Wheezing Frequency: last couple of days Pertinent Negatives: Patient denies fever, dizziness Disposition: [] ED /[x] Urgent Care (no appt availability in office) / [] Appointment(In office/virtual)/ []  [facility]/ [] Home Care/ [] Refused Recommended Disposition /[] [facility]/ []  Follow-up with PCP Additional Notes: patient's wife is on the phone with the patient who has hx of asthma c/o wheezing for the last few days. Patient states they have been using their inhalers but wheezing continues. Patient endorses when this happens he typically needs a shot of steroids. No availability in the office until Monday. Per protocol, the recommendation is Urgent Care. Patient and his wife verbalizes understanding of plan and all questions answered.   Copied from CRM [id number]. Topic: Clinical - Red Word Triage >> Apr 07, 2023  9:58 AM Rosina BIRCH wrote: Red Word that prompted transfer to Nurse Triage: pt has asthma and is wheezing really bad Reason for Disposition  [1] MILD difficulty breathing (e.g., minimal/no SOB at rest, SOB with walking, pulse <100) AND [2] NEW-onset or WORSE than normal  Answer Assessment - Initial Assessment Questions 1. RESPIRATORY STATUS: Describe your breathing? (e.g., wheezing, shortness of breath, unable to speak, severe coughing)      Wheezing 2. ONSET: When did this breathing problem begin?      Last few days 3. PATTERN Does the difficult breathing come and go, or has it been constant since it started?      constant 4. SEVERITY: How bad is your breathing? (e.g., mild, moderate, severe)    - MILD: No SOB at rest, mild SOB with walking, speaks normally in sentences, can lie down, no retractions, pulse < 100.    - MODERATE: SOB at rest, SOB with minimal exertion and prefers to sit, cannot lie down flat, speaks in phrases, mild retractions, audible wheezing, pulse 100-120.    - SEVERE: Very SOB at rest,  speaks in single words, struggling to breathe, sitting hunched forward, retractions, pulse > 120      Moderate 5. RECURRENT SYMPTOM: Have you had difficulty breathing before? If Yes, ask: When was the last time? and What happened that time?      Yes 6. CARDIAC HISTORY: Do you have any history of heart disease? (e.g., heart attack, angina, bypass surgery, angioplasty)      No 7. LUNG HISTORY: Do you have any history of lung disease?  (e.g., pulmonary embolus, asthma, emphysema)     asthma 8. CAUSE: What do you think is causing the breathing problem?      Hx of asthma 9. OTHER SYMPTOMS: Do you have any other symptoms? (e.g., dizziness, runny nose, cough, chest pain, fever)     Cough, runny nose 10. O2 SATURATION MONITOR:  Do you use an oxygen saturation monitor (pulse oximeter) at home? If Yes, ask: What is your reading (oxygen level) today? What is your usual oxygen saturation reading? (e.g., 95%)       Mid 90s  Protocols used: Breathing Difficulty-A-AH

## 2023-04-07 NOTE — Telephone Encounter (Signed)
 Agree. thanks

## 2023-04-13 ENCOUNTER — Encounter: Payer: Self-pay | Admitting: Gastroenterology

## 2023-04-22 ENCOUNTER — Other Ambulatory Visit: Payer: Self-pay | Admitting: Internal Medicine

## 2023-04-24 ENCOUNTER — Other Ambulatory Visit: Payer: Self-pay

## 2023-04-27 ENCOUNTER — Other Ambulatory Visit: Payer: Self-pay

## 2023-04-27 ENCOUNTER — Emergency Department (HOSPITAL_COMMUNITY): Payer: PPO

## 2023-04-27 ENCOUNTER — Emergency Department (HOSPITAL_COMMUNITY)
Admission: EM | Admit: 2023-04-27 | Discharge: 2023-04-27 | Disposition: A | Discharge status: Home / Self Care | Payer: PPO | Attending: Emergency Medicine | Admitting: Emergency Medicine

## 2023-04-27 ENCOUNTER — Encounter (HOSPITAL_COMMUNITY): Payer: Self-pay

## 2023-04-27 DIAGNOSIS — Z7982 Long term (current) use of aspirin: Secondary | ICD-10-CM | POA: Insufficient documentation

## 2023-04-27 DIAGNOSIS — R269 Unspecified abnormalities of gait and mobility: Secondary | ICD-10-CM | POA: Insufficient documentation

## 2023-04-27 DIAGNOSIS — N400 Enlarged prostate without lower urinary tract symptoms: Secondary | ICD-10-CM | POA: Insufficient documentation

## 2023-04-27 DIAGNOSIS — I10 Essential (primary) hypertension: Secondary | ICD-10-CM | POA: Diagnosis not present

## 2023-04-27 DIAGNOSIS — R531 Weakness: Secondary | ICD-10-CM | POA: Insufficient documentation

## 2023-04-27 DIAGNOSIS — E785 Hyperlipidemia, unspecified: Secondary | ICD-10-CM | POA: Diagnosis not present

## 2023-04-27 DIAGNOSIS — R42 Dizziness and giddiness: Secondary | ICD-10-CM | POA: Diagnosis not present

## 2023-04-27 DIAGNOSIS — Z79899 Other long term (current) drug therapy: Secondary | ICD-10-CM | POA: Insufficient documentation

## 2023-04-27 DIAGNOSIS — R600 Localized edema: Secondary | ICD-10-CM | POA: Insufficient documentation

## 2023-04-27 DIAGNOSIS — Z20822 Contact with and (suspected) exposure to covid-19: Secondary | ICD-10-CM | POA: Diagnosis not present

## 2023-04-27 DIAGNOSIS — R2681 Unsteadiness on feet: Secondary | ICD-10-CM

## 2023-04-27 DIAGNOSIS — I771 Stricture of artery: Secondary | ICD-10-CM | POA: Diagnosis not present

## 2023-04-27 LAB — URINALYSIS, W/ REFLEX TO CULTURE (INFECTION SUSPECTED)
Bacteria, UA: NONE SEEN
Bilirubin Urine: NEGATIVE
Glucose, UA: NEGATIVE mg/dL
Hgb urine dipstick: NEGATIVE
Ketones, ur: NEGATIVE mg/dL
Leukocytes,Ua: NEGATIVE
Nitrite: NEGATIVE
Protein, ur: 100 mg/dL — AB
Specific Gravity, Urine: 1.013 (ref 1.005–1.030)
pH: 6 (ref 5.0–8.0)

## 2023-04-27 LAB — COMPREHENSIVE METABOLIC PANEL
ALT: 12 U/L (ref 0–44)
AST: 21 U/L (ref 15–41)
Albumin: 4.1 g/dL (ref 3.5–5.0)
Alkaline Phosphatase: 62 U/L (ref 38–126)
Anion gap: 10 (ref 5–15)
BUN: 34 mg/dL — ABNORMAL HIGH (ref 8–23)
CO2: 27 mmol/L (ref 22–32)
Calcium: 9.7 mg/dL (ref 8.9–10.3)
Chloride: 104 mmol/L (ref 98–111)
Creatinine, Ser: 1.67 mg/dL — ABNORMAL HIGH (ref 0.61–1.24)
GFR, Estimated: 38 mL/min — ABNORMAL LOW (ref >60)
Glucose, Bld: 108 mg/dL — ABNORMAL HIGH (ref 70–99)
Potassium: 3.9 mmol/L (ref 3.5–5.1)
Sodium: 141 mmol/L (ref 135–145)
Total Bilirubin: 1 mg/dL (ref 0.0–1.2)
Total Protein: 7.2 g/dL (ref 6.5–8.1)

## 2023-04-27 LAB — CBC WITH DIFFERENTIAL/PLATELET
Abs Immature Granulocytes: 0.01 10*3/uL (ref 0.00–0.07)
Basophils Absolute: 0 10*3/uL (ref 0.0–0.1)
Basophils Relative: 1 %
Eosinophils Absolute: 0.4 10*3/uL (ref 0.0–0.5)
Eosinophils Relative: 6 %
HCT: 40.9 % (ref 39.0–52.0)
Hemoglobin: 13.1 g/dL (ref 13.0–17.0)
Immature Granulocytes: 0 %
Lymphocytes Relative: 22 %
Lymphs Abs: 1.4 10*3/uL (ref 0.7–4.0)
MCH: 30.8 pg (ref 26.0–34.0)
MCHC: 32 g/dL (ref 30.0–36.0)
MCV: 96 fL (ref 80.0–100.0)
Monocytes Absolute: 0.5 10*3/uL (ref 0.1–1.0)
Monocytes Relative: 8 %
Neutro Abs: 4.1 10*3/uL (ref 1.7–7.7)
Neutrophils Relative %: 63 %
Platelets: 247 10*3/uL (ref 150–400)
RBC: 4.26 MIL/uL (ref 4.22–5.81)
RDW: 12.2 % (ref 11.5–15.5)
WBC: 6.5 10*3/uL (ref 4.0–10.5)
nRBC: 0 % (ref 0.0–0.2)

## 2023-04-27 LAB — RESP PANEL BY RT-PCR (RSV, FLU A&B, COVID)  RVPGX2
Influenza A by PCR: NEGATIVE
Influenza B by PCR: NEGATIVE
Resp Syncytial Virus by PCR: NEGATIVE
SARS Coronavirus 2 by RT PCR: NEGATIVE

## 2023-04-27 LAB — BRAIN NATRIURETIC PEPTIDE: B Natriuretic Peptide: 61.2 pg/mL (ref 0.0–100.0)

## 2023-04-27 LAB — TROPONIN I (HIGH SENSITIVITY): Troponin I (High Sensitivity): 9 ng/L (ref <18)

## 2023-04-27 MED ORDER — SODIUM CHLORIDE 0.9 % IV BOLUS
1000.0000 mL | Freq: Once | INTRAVENOUS | Status: AC
  Administered 2023-04-27: 1000 mL via INTRAVENOUS

## 2023-04-27 NOTE — Care Management (Signed)
Transition of Care [facility]) - Emergency Department Mini Assessment   Patient Details  Name: Stephen Wilkins MRN: [id number] Date of Birth: Apr 23, 1927  Transition of Care [facility]) CM/SW Contact:    Stephen Atlas, RN Phone Number: 04/27/2023, 1:56 PM   Clinical Narrative: Received TOC consult for HH/DME. Per chart review patient has c/o dizziness.  This RNCM spoke with patient, wife Stephen Wilkins and son Stephen Wilkins at bedside. Prior to admission patient has the following DME: cane, walker, transport chair, grab bars in shower. PTA patient also has private duty nursing with [employer] (4 hours per day for 4 days per week.) Patient reports he has a hard time picking up the walker when he's attempting to avoids his wife's oxygen tubes so he wants a rollator walker. This RNCM offered choice for DME and HH services. Patient chose [facility] for HHPT/OT, RN, SW, HHA, with a request for [facility] for PT. Patient will accept any DME supplier that accepts his insurance. Patient only wants rollator if his insurance will pay for it. This RNCM spoke with Stephen Wilkins with [employer] to refer for rollator walker to be delivered to patient's home. Stephen Wilkins will follow up post discharge. This RNCM spoke with Stephen Wilkins with [facility] who will follow patient post discharge for HHPT/OT, HHA, RN, SW.  Patient has been discharged.  No additional TOC needs at this time.    ED Mini Assessment: What brought you to the Emergency Department? : Pt complaining of feeling dizzy for the last few days. Tonight he couldn't sleep for being so dizzy  Barriers to Discharge: No Barriers Identified  Barrier interventions: coordinating HH/DME  Means of departure: Car  Interventions which prevented an admission or readmission: DME Provided, Home Health Consult or Services    Patient Contact and Communications        ,          Patient states their goals for this hospitalization and ongoing recovery are:: to feel better CMS Medicare.gov  Compare Post Acute Care list provided to:: Patient Choice offered to / list presented to : Spouse, Patient  Admission diagnosis:  dizziness Patient Active Problem List   Diagnosis Date Noted   Vertigo 11/29/2022   History of GI diverticular bleed 11/13/2022   History of colonic polyps 11/13/2022   Lower GI bleeding 11/12/2022   Lower GI bleed 11/11/2022   Hematochezia 11/11/2022   Unintentional weight loss 11/11/2022   Chronic constipation 11/11/2022   Acute on chronic anemia 11/11/2022   Acute GI bleeding 11/11/2022   OAB (overactive bladder) 11/09/2022   Lesion of ear 11/09/2022   Epithelial (juvenile) corneal dystrophy, 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   Shortness of breath 08/02/2022   Thoracic aortic aneurysm without rupture (HCC) 08/02/2022   Asthmatic bronchitis 08/02/2022   Acute hypoxemic respiratory failure (HCC) 06/28/2022   Severe persistent asthma with exacerbation 06/05/2022   Unspecified corneal edema 06/02/2022   Asthma 04/25/2022   Benign prostatic hyperplasia without urinary obstruction 04/25/2022   Hearing loss 04/25/2022   HTN (hypertension) 04/25/2022   Mixed hyperlipidemia 04/25/2022   Encounter for fitting and adjustment of hearing aid 04/25/2022   Encounter for well adult exam with abnormal findings 04/25/2022   Urinary frequency 10/24/2021   Heart murmur 10/24/2021   Benign prostatic hyperplasia with urinary frequency 10/24/2021   Low energy 10/21/2021   Vitamin D deficiency 04/16/2021   Pain in joint of left shoulder 01/23/2020   Bilateral shoulder pain 11/22/2019   Myalgia 11/22/2019   Gait disorder 11/22/2019   Chronic pain  of right thumb 05/17/2019   Primary osteoarthritis of first carpometacarpal joint of right hand 05/17/2019   Trigger finger of right thumb 05/17/2019   Corneal epithelial basement membrane dystrophy 03/04/2019   Keratoconjunctivitis sicca of both eyes not specified as Sjogren's 03/04/2019   Posterior vitreous detachment of both eyes  03/04/2019   Presbyopia of both eyes 03/04/2019   Pseudophakia of both eyes 03/04/2019   Febrile illness 12/27/2018   Choledocholithiasis 12/27/2018   Low back pain 07/11/2017   Non compliance w medication regimen 08/23/2016   Peripheral neuropathy 08/23/2016   COPD (chronic obstructive pulmonary disease) (HCC) 04/19/2016   Hyperglycemia 02/10/2016   Preventative health care 07/29/2015   CAP (community acquired pneumonia) 05/30/2015   Abnormal CXR 05/30/2015   Cough 04/22/2015   Asthma with exacerbation 04/22/2015   Urine abnormality 04/22/2015   Abdominal pain 09/11/2014   Abnormal liver function test 09/11/2014   Dysphagia 07/29/2014   Anxiety state 09/06/2013   CKD (chronic kidney disease) 09/06/2013   Hypothyroidism 09/06/2013   Rash and nonspecific skin eruption 02/27/2013   Dizziness and giddiness 01/04/2013   Abnormal TSH 06/04/2012   Gross hematuria 01/09/2012   Bruising 06/05/2011   Dementia (HCC) 09/01/2010   Cough variant asthma vs UACS  01/14/2010   ERECTILE DYSFUNCTION, ORGANIC 04/17/2009   FATIGUE 06/27/2008   PSA, INCREASED 06/27/2008   SCIATICA, RIGHT 04/28/2008   SCHATZKI'S RING 07/18/2007   Diaphragmatic hernia 07/18/2007   Schatzki's ring 07/18/2007   ABNORMAL ELECTROCARDIOGRAM 06/21/2007   Hyperlipidemia 03/23/2007   Essential hypertension 03/23/2007   Allergic rhinitis 03/23/2007   GERD 03/23/2007   BENIGN PROSTATIC HYPERTROPHY 03/23/2007   NEPHROLITHIASIS, HX OF 03/23/2007   PCP:  Corwin Levins, MD Pharmacy:   [facility] [address] [place] - [address] [place] [address] Phone: [phone] Fax: [phone]

## 2023-04-27 NOTE — ED Provider Notes (Addendum)
[facility] AT [facility] Provider Note   CSN: [id number] Arrival date & time: 04/27/23  7829     History  Chief Complaint  Patient presents with   Dizziness   Weakness    Stephen Wilkins is a 89 y.o. male.  Stephen Wilkins is a 89 y.o. male with history of hypertension, hyperlipidemia, BPH, who presents to the emergency department for evaluation of generalized weakness and lightheadedness.  Patient reports that over the past week he has been feeling increasingly weak and had episodes where he feels like he is going to pass out.  The most severe episode happened 1 week ago when he and his wife were grocery shopping and he felt so weak that people in the aisle with them had to help hold him up until they could get him into a wheelchair.  He has not fallen or completely lost consciousness.  He reports that over the past year he has had a general decline in his mobility and has had to start using a cane, his wife reports that he is supposed to be using a walker but does not like using this.  He has had more difficulty getting around the house.  Recently has had more frequent lightheaded spells.  Has some baseline issues with balance which are unchanged.  Wife was increasingly concerned and push for him to come to the hospital this morning.  He denies any associated chest pain, shortness of breath or palpitations.  Denies headaches.  No abdominal pain, nausea or vomiting, he does endorse some issues with constipation but this is not new.  No recent medication changes.  No other aggravating or alleviating factors.  The history is provided by the patient, the spouse and medical records.  Dizziness Associated symptoms: weakness   Associated symptoms: no chest pain, no diarrhea, no headaches, no nausea, no palpitations, no shortness of breath and no vomiting        Home Medications Prior to Admission medications   Medication Sig Start Date End Date Taking? Authorizing  Provider  albuterol (PROVENTIL) (2.5 MG/3ML) 0.083% nebulizer solution USE 1 VIAL IN NEBULIZER EVERY 6 HOURS AS NEEDED FOR WHEEZING OR  SHORTNESS  OF  BREATH 01/30/23   Corwin Levins, MD  albuterol (VENTOLIN HFA) 108 (90 Base) MCG/ACT inhaler INHALE 2 PUFFS BY MOUTH EVERY 6 HOURS AS NEEDED FOR WHEEZING FOR SHORTNESS OF BREATH Patient taking differently: Inhale 2 puffs into the lungs every 6 (six) hours as needed for shortness of breath. 08/12/22   Corwin Levins, MD  ALPRAZolam Prudy Feeler) 0.5 MG tablet 1/2 - 1 tab by mouth twice per day as needed 09/22/20   Corwin Levins, MD  aspirin EC 81 MG tablet Take by mouth. 02/10/16   [provider]  budesonide-formoterol (SYMBICORT) 80-4.5 MCG/ACT inhaler Inhale 2 puffs into the lungs daily as needed (shortness of breath). 05/09/17   [provider]  Cholecalciferol (VITAMIN D) 1000 UNITS capsule Take 1,000 Units by mouth every other day.     [provider]  Cholecalciferol 50 MCG (2000 UT) TABS 1 tab by mouth once daily Patient taking differently: Take 1 tablet by mouth daily. 04/16/21   Corwin Levins, MD  cyanocobalamin 50 MCG tablet Take 100 mcg by mouth daily. 03/02/22   [provider]  desoximetasone (TOPICORT) 0.25 % cream APPLY SPARINGLY TO THE AFFECTED AREA TWICE DAILY AS NEEDED AS DIRECTED Patient taking differently: Apply 1 application  topically 2 (two) times  daily. Itching 04/30/18   Corwin Levins, MD  meclizine (ANTIVERT) 12.5 MG tablet Take 1 tablet (12.5 mg total) by mouth 3 (three) times daily as needed for dizziness. 11/29/22 11/29/23  Corwin Levins, MD  montelukast (SINGULAIR) 10 MG tablet Take 1 tablet (10 mg total) by mouth at bedtime. 10/03/22   Corwin Levins, MD  Multiple Vitamin (MULTIVITAMINS PO) Take 1 tablet by mouth daily. 03/02/22   [provider]  Multiple Vitamins-Minerals (CENTRUM SILVER PO) Take 1 tablet by mouth daily.     [provider]  pantoprazole (PROTONIX) 40 MG tablet Take 1  tablet (40 mg total) by mouth daily. 11/29/22   Corwin Levins, MD  polyethylene glycol powder [facility]) powder Take 1/2 capful by mouth once daily Patient taking differently: Take 17 g by mouth 2 (two) times a week. 01/28/15   Corwin Levins, MD  rosuvastatin (CRESTOR) 10 MG tablet Take 1 tablet by mouth once daily 08/11/22   Corwin Levins, MD  RSV vaccine recomb adjuvanted (AREXVY) 120 MCG/0.5ML injection Inject into the muscle. 03/09/22   Judyann Munson, MD  sodium chloride (MURO 128) 5 % ophthalmic solution Place 1 drop into both eyes 4 (four) times daily as needed for eye irritation. 05/24/22   [provider]  solifenacin (VESICARE) 5 MG tablet Take 1 tablet by mouth once daily 04/24/23   Corwin Levins, MD  tamsulosin [facility]) 0.4 MG CAPS capsule Take 1 capsule by mouth at bedtime 04/24/23   Corwin Levins, MD  vitamin B-12 (CYANOCOBALAMIN) 100 MCG tablet Take 100 mcg by mouth once a week.    [provider]  vitamin C (ASCORBIC ACID) 500 MG tablet Take 1,000 mg by mouth daily.     [provider]      Allergies    Atorvastatin, Doxazosin mesylate, Hydrocodone bit-homatrop mbr, and Hydrocodone    Review of Systems   Review of Systems  Constitutional:  Negative for chills, fatigue and fever.  Respiratory:  Negative for cough and shortness of breath.   Cardiovascular:  Negative for chest pain and palpitations.  Gastrointestinal:  Negative for abdominal pain, diarrhea, nausea and vomiting.  Genitourinary:  Negative for dysuria and frequency.  Musculoskeletal:  Negative for back pain.  Neurological:  Positive for weakness and light-headedness. Negative for dizziness, syncope, facial asymmetry, speech difficulty, numbness and headaches.  Psychiatric/Behavioral:  Negative for confusion.     Physical Exam Updated Vital Signs BP (!) 148/72   Pulse 77   Temp 98 F (36.7 C)   Resp 20   Ht 6' (1.829 m)   Wt 80.3 kg   SpO2 99%   BMI 24.01 kg/m  Physical Exam Vitals  and nursing note reviewed.  Constitutional:      General: He is not in acute distress.    Appearance: Normal appearance. He is well-developed. He is not ill-appearing or diaphoretic.  HENT:     Head: Normocephalic and atraumatic.     Mouth/Throat:     Mouth: Mucous membranes are moist.     Pharynx: Oropharynx is clear.  Eyes:     General:        Right eye: No discharge.        Left eye: No discharge.     Pupils: Pupils are equal, round, and reactive to light.  Cardiovascular:     Rate and Rhythm: Normal rate and regular rhythm.     Pulses: Normal pulses.     Heart sounds: Normal  heart sounds.  Pulmonary:     Effort: Pulmonary effort is normal. No respiratory distress.     Breath sounds: Normal breath sounds. No wheezing or rales.     Comments: Respirations equal and unlabored, patient able to speak in full sentences, lungs clear to auscultation bilaterally  Abdominal:     General: Bowel sounds are normal. There is no distension.     Palpations: Abdomen is soft. There is no mass.     Tenderness: There is no abdominal tenderness. There is no guarding.     Comments: Abdomen soft, nondistended, nontender to palpation in all quadrants without guarding or peritoneal signs  Musculoskeletal:        General: No deformity.     Cervical back: Neck supple.  Skin:    General: Skin is warm and dry.     Capillary Refill: Capillary refill takes less than 2 seconds.  Neurological:     Mental Status: He is alert and oriented to person, place, and time.     Coordination: Coordination normal.     Comments: Speech is clear, able to follow commands CN III-XII intact Normal strength in upper and lower extremities bilaterally including dorsiflexion and plantar flexion, strong and equal grip strength Sensation normal to light and sharp touch Moves extremities without ataxia, coordination intact  Psychiatric:        Mood and Affect: Mood normal.        Behavior: Behavior normal.     ED Results /  Procedures / Treatments   Labs (all labs ordered are listed, but only abnormal results are displayed) Labs Reviewed  COMPREHENSIVE METABOLIC PANEL - Abnormal; Notable for the following components:      Result Value   Glucose, Bld 108 (*)    BUN 34 (*)    Creatinine, Ser 1.67 (*)    GFR, Estimated 38 (*)    All other components within normal limits  URINALYSIS, W/ REFLEX TO CULTURE (INFECTION SUSPECTED) - Abnormal; Notable for the following components:   Protein, ur 100 (*)    All other components within normal limits  RESP PANEL BY RT-PCR (RSV, FLU A&B, COVID)  [id number]  CBC WITH DIFFERENTIAL/PLATELET  BRAIN NATRIURETIC PEPTIDE  TROPONIN I (HIGH SENSITIVITY)    EKG EKG Interpretation Date/Time:  Thursday April 27 2023 06:33:02 EST Ventricular Rate:  91 PR Interval:  135 QRS Duration:  84 QT Interval:  379 QTC Calculation: 467 R Axis:   45  Text Interpretation: Sinus rhythm Baseline wander Confirmed by Cathren Laine ([id number]) on 04/27/2023 9:04:54 AM  Radiology DG Chest Port 1 View Result Date: 04/27/2023 CLINICAL DATA:  89 year old male with weakness, dizziness. EXAM: PORTABLE CHEST 1 VIEW COMPARISON:  Chest CT 06/28/2022 and earlier. FINDINGS: Portable AP semi upright view at 0716 hours. Stable lung volumes and mediastinal contours. Tortuous thoracic aorta. Visualized tracheal air column is within normal limits. Allowing for portable technique the lungs are clear. No acute osseous abnormality identified. Negative visible bowel gas. IMPRESSION: No acute cardiopulmonary abnormality. Electronically Signed   By: Odessa Fleming M.D.   On: 04/27/2023 07:25    Procedures Procedures    Medications Ordered in ED Medications  sodium chloride 0.9 % bolus 1,000 mL (0 mLs Intravenous Stopped 04/27/23 1344)    ED Course/ Medical Decision Making/ A&P                                 Medical Decision Making  Amount and/or Complexity of Data Reviewed Labs: ordered. Radiology: ordered.   89  y.o. male presents to the ED with complaints of lightheadedness and weakness, this involves an extensive number of treatment options, and is a complaint that carries with it a high risk of complications and morbidity.    On arrival pt is nontoxic, vitals significant for mild hypertension but otherwise stable.  Additional history obtained from wife and son at bedside. Previous records obtained and reviewed   I ordered medication including IV fluid bolus for lightheadedness  Lab Tests:  I Ordered, reviewed, and interpreted labs, which included: No leukocytosis or anemia noted, mildly elevated creatinine but no other significant electrolyte derangements, normal renal function, troponin and BNP within normal limits.  Urinalysis without evidence of UTI.  COVID and flu testing ordered as this can often lead to generalized weakness and elderly but fortunately negative.  Imaging Studies ordered:  I ordered imaging studies which included cxr, I independently visualized and interpreted imaging which showed no active cardiopulmonary disease.  ED Course:   Discussed reassuring workup with patient and family at bedside.  Patient's son arrived with results from [place] and reports that overall he thinks the symptoms with his dad have been more chronic with worsening mobility over the past year.  He has noticed some steady and progressive weakness but no sudden changes and no witnessed syncopal episodes.  He has not noticed any ataxia.  He reports that his dad is very hesitant to use the walker and it is often difficult to get him to use the cane.  When patient ambulated in department with walker he was able to ambulate with steady shuffling gait without assistance.  Based on his gait I am not surprised that he has increased difficulty walking with cane.  I stressed the importance to the patient and his wife and his son that he really needs to use a walker at all times and up and moving to help prevent falls and  injury.  Placed consult to transitions of care for home health care and physical therapy and DME order placed for rollator walker with seat.  Based on reassuring workup while admission was considered I feel patient is appropriate for discharge home with close primary care follow-up and home health.    Portions of this note were generated with Scientist, clinical (histocompatibility and immunogenetics). Dictation errors may occur despite best attempts at proofreading.         Final Clinical Impression(s) / ED Diagnoses Final diagnoses:  Gait instability  Weakness    Rx / DC Orders ED Discharge Orders          Ordered    For home use only DME 4 wheeled rolling walker with seat        04/27/23 1351              Velda Shell 04/28/23 2216    Cathren Laine, MD 04/29/23 1350    Gala Lewandowsky F, PA-C 05/04/23 1435    Cathren Laine, MD 05/05/23 [phone]

## 2023-04-27 NOTE — ED Triage Notes (Signed)
Pt complaining of feeling dizzy for the last few days. Tonight he couldn't sleep for being so dizzy

## 2023-04-27 NOTE — ED Notes (Signed)
While pt was sleeping pt o2 was decreasing pt o2 was sitting at 84 room air, pt was  placed on 2ml of oxygen pt  now resting

## 2023-04-27 NOTE — Discharge Instructions (Addendum)
Your lab work and chest x-ray today were overall reassuring.  You may have been slightly dehydrated contributing to feeling lightheaded.  I suspect that a lot of your troubles walking would be helped by more consistently using your walker.  I have placed orders for them to work on getting home health set up for you please keep an eye out for a phone call regarding this.  Follow-up closely with your primary care doctor regarding the symptoms.  Return for new or worsening symptoms.

## 2023-04-28 ENCOUNTER — Telehealth: Payer: Self-pay

## 2023-04-28 NOTE — Transitions of Care (Post Inpatient/ED Visit) (Unsigned)
   04/28/2023  Name: Stephen Wilkins MRN: [id number] DOB: [birth year]  Today's TOC FU Call Status: Today's TOC FU Call Status:: Unsuccessful Call (1st Attempt) Unsuccessful Call (1st Attempt) Date: 04/28/23  Attempted to reach the patient regarding the most recent Inpatient/ED visit.  Follow Up Plan: Additional outreach attempts will be made to reach the patient to complete the Transitions of Care (Post Inpatient/ED visit) call.   Signature Karena Addison, LPN [facility] Nurse Health Advisor Direct Dial [phone]

## 2023-05-01 ENCOUNTER — Telehealth: Payer: Self-pay

## 2023-05-01 NOTE — Telephone Encounter (Signed)
Called and gave verbals.

## 2023-05-01 NOTE — Telephone Encounter (Signed)
Copied from CRM [id number]. Topic: Clinical - Home Health Verbal Orders >> May 01, 2023  8:40 AM Elizebeth Brooking wrote: Caller/Agency: [employer] [employer]  Callback Number: [phone] Service Requested: Skilled Nursing  Frequency: 1 week 3 and every other week  Any new concerns about the patient? No , refused the home health but will let pt and ot and social work come

## 2023-05-01 NOTE — Telephone Encounter (Signed)
Ok for [employer]

## 2023-05-02 NOTE — Transitions of Care (Post Inpatient/ED Visit) (Signed)
05/02/2023  Name: Stephen Wilkins MRN: [id number] DOB: June 17, 1927  Today's TOC FU Call Status: Today's TOC FU Call Status:: Successful TOC FU Call Completed Unsuccessful Call (1st Attempt) Date: 04/28/23 [facility] FU Call Complete Date: 05/02/23 Patient's Name and Date of Birth confirmed.  Transition Care Management Follow-up Telephone Call Date of Discharge: 04/27/23 Discharge Facility: [facility]) Type of Discharge: Emergency Department How have you been since you were released from the hospital?: Better Any questions or concerns?: No  Items Reviewed: Did you receive and understand the discharge instructions provided?: Yes Medications obtained,verified, and reconciled?: Yes (Medications Reviewed) Any new allergies since your discharge?: No Dietary orders reviewed?: Yes Do you have support at home?: Yes People in Home: spouse Name of Support/Comfort Primary Source: [employer]  Medications Reviewed Today: Medications Reviewed Today     Reviewed by Karena Addison, LPN (Licensed Practical Nurse) on 05/02/23 at 1539  Med List Status: <None>   Medication Order Taking? Sig Documenting Provider Last Dose Status Informant  albuterol (PROVENTIL) (2.5 MG/3ML) 0.083% nebulizer solution [id number]  USE 1 VIAL IN NEBULIZER EVERY 6 HOURS AS NEEDED FOR WHEEZING OR  SHORTNESS  OF  BREATH Corwin Levins, MD  Active   albuterol (PROVENTIL) (2.5 MG/3ML) 0.083% nebulizer solution 2.5 mg [id number]   Corwin Levins, MD  Active   albuterol (VENTOLIN HFA) 108 (90 Base) MCG/ACT inhaler [id number] No INHALE 2 PUFFS BY MOUTH EVERY 6 HOURS AS NEEDED FOR WHEEZING FOR SHORTNESS OF BREATH  Patient taking differently: Inhale 2 puffs into the lungs every 6 (six) hours as needed for shortness of breath.   Corwin Levins, MD Taking Active Self, Spouse/Significant Other, Pharmacy Records  ALPRAZolam Prudy Feeler) 0.5 MG tablet [id number] No 1/2 - 1 tab by mouth twice per day as needed Corwin Levins, MD Taking Active Self,  Spouse/Significant Other, Pharmacy Records  aspirin EC 81 MG tablet [id number] No Take by mouth. [provider] Taking Active Self, Spouse/Significant Other, Pharmacy Records  budesonide-formoterol [facility]) 80-4.5 MCG/ACT inhaler [id number] No Inhale 2 puffs into the lungs daily as needed (shortness of breath). [provider] Taking Active Self, Spouse/Significant Other, Pharmacy Records  Cholecalciferol (VITAMIN D) 1000 UNITS capsule [id number] No Take 1,000 Units by mouth every other day.  [provider] Taking Active Self, Spouse/Significant Other, Pharmacy Records  Cholecalciferol 50 MCG (2000 UT) TABS [id number] No 1 tab by mouth once daily  Patient taking differently: Take 1 tablet by mouth daily.   Corwin Levins, MD Taking Active Self, Spouse/Significant Other, Pharmacy Records  cyanocobalamin 50 MCG tablet [id number] No Take 100 mcg by mouth daily. [provider] Taking Active Self, Spouse/Significant Other, Pharmacy Records  desoximetasone (TOPICORT) 0.25 % cream [id number] No APPLY SPARINGLY TO THE AFFECTED AREA TWICE DAILY AS NEEDED AS DIRECTED  Patient taking differently: Apply 1 application  topically 2 (two) times daily. Itching   Corwin Levins, MD Taking Active Self, Spouse/Significant Other, Pharmacy Records  meclizine (ANTIVERT) 12.5 MG tablet [id number]  Take 1 tablet (12.5 mg total) by mouth 3 (three) times daily as needed for dizziness. Corwin Levins, MD  Active   montelukast (SINGULAIR) 10 MG tablet [id number] No Take 1 tablet (10 mg total) by mouth at bedtime. Corwin Levins, MD Taking Active Self, Spouse/Significant Other, Pharmacy Records  Multiple Vitamin (MULTIVITAMINS PO) [id number] No Take 1 tablet by mouth daily. [provider] Taking Active Self, Spouse/Significant Other, Pharmacy Records  Multiple Vitamins-Minerals (CENTRUM SILVER PO) [id number] No Take 1 tablet  by mouth daily.  [provider] Taking Active Self,  Spouse/Significant Other, Pharmacy Records  pantoprazole (PROTONIX) 40 MG tablet [id number]  Take 1 tablet (40 mg total) by mouth daily. Corwin Levins, MD  Active   polyethylene glycol powder [facility]) powder [id number] No Take 1/2 capful by mouth once daily  Patient taking differently: Take 17 g by mouth 2 (two) times a week.   Corwin Levins, MD Taking Active Self, Spouse/Significant Other, Pharmacy Records  rosuvastatin (CRESTOR) 10 MG tablet [id number] No Take 1 tablet by mouth once daily Corwin Levins, MD Taking Active Self, Spouse/Significant Other, Pharmacy Records  RSV vaccine recomb adjuvanted (AREXVY) 120 MCG/0.5ML injection [id number] No Inject into the muscle. Judyann Munson, MD Taking Active Self, Spouse/Significant Other, Pharmacy Records  sodium chloride (MURO 128) 5 % ophthalmic solution [id number] No Place 1 drop into both eyes 4 (four) times daily as needed for eye irritation. [provider] Taking Active Self, Spouse/Significant Other, Pharmacy Records  solifenacin (VESICARE) 5 MG tablet [id number]  Take 1 tablet by mouth once daily Corwin Levins, MD  Active   tamsulosin [facility]) 0.4 MG CAPS capsule [id number]  Take 1 capsule by mouth at bedtime Corwin Levins, MD  Active   vitamin B-12 (CYANOCOBALAMIN) 100 MCG tablet [id number] No Take 100 mcg by mouth once a week. [provider] Taking Active Self, Spouse/Significant Other, Pharmacy Records  vitamin C (ASCORBIC ACID) 500 MG tablet [id number] No Take 1,000 mg by mouth daily.  [provider] Taking Active Self, Spouse/Significant Other, Pharmacy Records            Home Care and Equipment/Supplies: Were Home Health Services Ordered?: NA Any new equipment or medical supplies ordered?: NA  Functional Questionnaire: Do you need assistance with bathing/showering or dressing?: Yes Do you need assistance with meal preparation?: Yes Do you need assistance with eating?: No Do you have difficulty maintaining  continence: Yes Do you need assistance with getting out of bed/getting out of a chair/moving?: Yes Do you have difficulty managing or taking your medications?: Yes  Follow up appointments reviewed: PCP Follow-up appointment confirmed?: Yes Date of PCP follow-up appointment?: 05/11/23 Follow-up Provider: [facility] Follow-up appointment confirmed?: No Reason Specialist Follow-Up Not Confirmed: Patient has Specialist Provider Number and will Call for Appointment (needs referral) Do you need transportation to your follow-up appointment?: No Do you understand care options if your condition(s) worsen?: Yes-patient verbalized understanding    SIGNATURE Karena Addison, LPN [facility] Nurse Health Advisor Direct Dial [phone]

## 2023-05-11 ENCOUNTER — Ambulatory Visit: Payer: No Typology Code available for payment source | Admitting: Internal Medicine

## 2023-05-17 ENCOUNTER — Ambulatory Visit: Payer: No Typology Code available for payment source | Admitting: Internal Medicine

## 2023-05-24 ENCOUNTER — Inpatient Hospital Stay: Payer: No Typology Code available for payment source | Admitting: Internal Medicine

## 2023-05-26 ENCOUNTER — Encounter: Payer: Self-pay | Admitting: Internal Medicine

## 2023-05-29 ENCOUNTER — Other Ambulatory Visit: Payer: Self-pay | Admitting: Internal Medicine

## 2023-05-30 DIAGNOSIS — Z961 Presence of intraocular lens: Secondary | ICD-10-CM | POA: Diagnosis not present

## 2023-05-30 DIAGNOSIS — H18513 Endothelial corneal dystrophy, bilateral: Secondary | ICD-10-CM | POA: Diagnosis not present

## 2023-05-30 DIAGNOSIS — H524 Presbyopia: Secondary | ICD-10-CM | POA: Diagnosis not present

## 2023-06-01 ENCOUNTER — Encounter: Payer: Self-pay | Admitting: Internal Medicine

## 2023-06-01 ENCOUNTER — Ambulatory Visit (INDEPENDENT_AMBULATORY_CARE_PROVIDER_SITE_OTHER): Payer: No Typology Code available for payment source | Admitting: Internal Medicine

## 2023-06-01 VITALS — BP 160/92 | HR 82 | Temp 97.8°F | Ht 72.0 in | Wt 172.0 lb

## 2023-06-01 DIAGNOSIS — E538 Deficiency of other specified B group vitamins: Secondary | ICD-10-CM

## 2023-06-01 DIAGNOSIS — N401 Enlarged prostate with lower urinary tract symptoms: Secondary | ICD-10-CM

## 2023-06-01 DIAGNOSIS — K5909 Other constipation: Secondary | ICD-10-CM | POA: Diagnosis not present

## 2023-06-01 DIAGNOSIS — R351 Nocturia: Secondary | ICD-10-CM | POA: Diagnosis not present

## 2023-06-01 DIAGNOSIS — J411 Mucopurulent chronic bronchitis: Secondary | ICD-10-CM | POA: Diagnosis not present

## 2023-06-01 DIAGNOSIS — H543 Unqualified visual loss, both eyes: Secondary | ICD-10-CM | POA: Insufficient documentation

## 2023-06-01 DIAGNOSIS — E559 Vitamin D deficiency, unspecified: Secondary | ICD-10-CM

## 2023-06-01 DIAGNOSIS — K219 Gastro-esophageal reflux disease without esophagitis: Secondary | ICD-10-CM

## 2023-06-01 DIAGNOSIS — I739 Peripheral vascular disease, unspecified: Secondary | ICD-10-CM

## 2023-06-01 DIAGNOSIS — Z0001 Encounter for general adult medical examination with abnormal findings: Secondary | ICD-10-CM | POA: Diagnosis not present

## 2023-06-01 DIAGNOSIS — R739 Hyperglycemia, unspecified: Secondary | ICD-10-CM

## 2023-06-01 DIAGNOSIS — E7800 Pure hypercholesterolemia, unspecified: Secondary | ICD-10-CM | POA: Diagnosis not present

## 2023-06-01 DIAGNOSIS — I10 Essential (primary) hypertension: Secondary | ICD-10-CM | POA: Diagnosis not present

## 2023-06-01 LAB — LIPID PANEL
Cholesterol: 194 mg/dL (ref 0–200)
HDL: 50.6 mg/dL (ref >39.00)
LDL Cholesterol: 102 mg/dL — ABNORMAL HIGH (ref 0–99)
NonHDL: 143.24
Total CHOL/HDL Ratio: 4
Triglycerides: 207 mg/dL — ABNORMAL HIGH (ref 0.0–149.0)
VLDL: 41.4 mg/dL — ABNORMAL HIGH (ref 0.0–40.0)

## 2023-06-01 LAB — CBC WITH DIFFERENTIAL/PLATELET
Basophils Absolute: 0.1 10*3/uL (ref 0.0–0.1)
Basophils Relative: 0.7 % (ref 0.0–3.0)
Eosinophils Absolute: 0.3 10*3/uL (ref 0.0–0.7)
Eosinophils Relative: 3.9 % (ref 0.0–5.0)
HCT: 39.8 % (ref 39.0–52.0)
Hemoglobin: 13.1 g/dL (ref 13.0–17.0)
Lymphocytes Relative: 18.9 % (ref 12.0–46.0)
Lymphs Abs: 1.3 10*3/uL (ref 0.7–4.0)
MCHC: 32.9 g/dL (ref 30.0–36.0)
MCV: 94.1 fL (ref 78.0–100.0)
Monocytes Absolute: 0.5 10*3/uL (ref 0.1–1.0)
Monocytes Relative: 7.6 % (ref 3.0–12.0)
Neutro Abs: 4.7 10*3/uL (ref 1.4–7.7)
Neutrophils Relative %: 68.9 % (ref 43.0–77.0)
Platelets: 250 10*3/uL (ref 150.0–400.0)
RBC: 4.23 Mil/uL (ref 4.22–5.81)
RDW: 13.2 % (ref 11.5–15.5)
WBC: 6.8 10*3/uL (ref 4.0–10.5)

## 2023-06-01 LAB — URINALYSIS, ROUTINE W REFLEX MICROSCOPIC
Bilirubin Urine: NEGATIVE
Hgb urine dipstick: NEGATIVE
Ketones, ur: NEGATIVE
Leukocytes,Ua: NEGATIVE
Nitrite: NEGATIVE
RBC / HPF: NONE SEEN (ref 0–?)
Specific Gravity, Urine: 1.015 (ref 1.000–1.030)
Total Protein, Urine: 100 — AB
Urine Glucose: NEGATIVE
Urobilinogen, UA: 0.2 (ref 0.0–1.0)
pH: 7 (ref 5.0–8.0)

## 2023-06-01 LAB — BASIC METABOLIC PANEL
BUN: 32 mg/dL — ABNORMAL HIGH (ref 6–23)
CO2: 32 meq/L (ref 19–32)
Calcium: 9.6 mg/dL (ref 8.4–10.5)
Chloride: 102 meq/L (ref 96–112)
Creatinine, Ser: 1.95 mg/dL — ABNORMAL HIGH (ref 0.40–1.50)
GFR: 29.16 mL/min — ABNORMAL LOW (ref >60.00)
Glucose, Bld: 108 mg/dL — ABNORMAL HIGH (ref 70–99)
Potassium: 4.4 meq/L (ref 3.5–5.1)
Sodium: 141 meq/L (ref 135–145)

## 2023-06-01 LAB — TSH: TSH: 8.57 u[IU]/mL — ABNORMAL HIGH (ref 0.35–5.50)

## 2023-06-01 LAB — HEPATIC FUNCTION PANEL
ALT: 9 U/L (ref 0–53)
AST: 18 U/L (ref 0–37)
Albumin: 4 g/dL (ref 3.5–5.2)
Alkaline Phosphatase: 63 U/L (ref 39–117)
Bilirubin, Direct: 0.1 mg/dL (ref 0.0–0.3)
Total Bilirubin: 0.8 mg/dL (ref 0.2–1.2)
Total Protein: 6.7 g/dL (ref 6.0–8.3)

## 2023-06-01 LAB — VITAMIN D 25 HYDROXY (VIT D DEFICIENCY, FRACTURES): VITD: 24.87 ng/mL — ABNORMAL LOW (ref 30.00–100.00)

## 2023-06-01 LAB — MICROALBUMIN / CREATININE URINE RATIO
Creatinine,U: 87.5 mg/dL
Microalb Creat Ratio: 666 mg/g — ABNORMAL HIGH (ref 0.0–30.0)
Microalb, Ur: 58.3 mg/dL — ABNORMAL HIGH (ref 0.0–1.9)

## 2023-06-01 LAB — HEMOGLOBIN A1C: Hgb A1c MFr Bld: 6.1 % (ref 4.6–6.5)

## 2023-06-01 LAB — VITAMIN B12: Vitamin B-12: 447 pg/mL (ref 211–911)

## 2023-06-01 MED ORDER — TAMSULOSIN HCL 0.4 MG PO CAPS: 0.4000 mg | ORAL_CAPSULE | Freq: Two times a day (BID) | ORAL | 3 refills | Status: DC

## 2023-06-01 MED ORDER — SOLIFENACIN SUCCINATE 5 MG PO TABS: 5.0000 mg | ORAL_TABLET | Freq: Every day | ORAL | 3 refills | Status: AC

## 2023-06-01 MED ORDER — ALBUTEROL SULFATE HFA 108 (90 BASE) MCG/ACT IN AERS: 2.0000 | INHALATION_SPRAY | Freq: Four times a day (QID) | RESPIRATORY_TRACT | 11 refills | Status: DC | PRN

## 2023-06-01 MED ORDER — MONTELUKAST SODIUM 10 MG PO TABS: 10.0000 mg | ORAL_TABLET | Freq: Every day | ORAL | 3 refills | Status: DC

## 2023-06-01 MED ORDER — PANTOPRAZOLE SODIUM 40 MG PO TBEC: 40.0000 mg | DELAYED_RELEASE_TABLET | Freq: Every day | ORAL | 3 refills | Status: AC

## 2023-06-01 MED ORDER — ROSUVASTATIN CALCIUM 10 MG PO TABS: 10.0000 mg | ORAL_TABLET | Freq: Every day | ORAL | 3 refills | Status: AC

## 2023-06-01 MED ORDER — LOSARTAN POTASSIUM 100 MG PO TABS: 100.0000 mg | ORAL_TABLET | Freq: Every day | ORAL | 3 refills | Status: DC

## 2023-06-01 MED ORDER — TRELEGY ELLIPTA 100-62.5-25 MCG/ACT IN AEPB: 1.0000 | INHALATION_SPRAY | Freq: Every day | RESPIRATORY_TRACT | 11 refills | Status: AC

## 2023-06-01 NOTE — Progress Notes (Signed)
 Patient ID: Stephen Wilkins, male   DOB: 02/02/1928, 89 y.o.   MRN: [id number]         Chief Complaint:: wellness exam and Medical Management of Chronic Issues [facility] follow up, has some questions about the [facility] results and other concerns , wants to verify what meds he should be taking )  , htn, PAD, COPD, BPH with nocturia, constipation, GERD, medication noncompliance and dementia       HPI:  Stephen Wilkins is a 89 y.o. male here for wellness exam; declines all immunizations, o/w up to date                        Also Pt denies chest pain, increased sob or doe, wheezing, orthopnea, PND, increased LE swelling, palpitations, dizziness or syncope.   Pt denies polydipsia, polyuria, or new focal neuro s/s.   Has had mild worsening reflux, but no abd pain, dysphagia, n/v, or blood but has ongoing mild intermittent constipation as well.   Pt denies polydipsia, polyuria, or new focal neuro s/s.   Denies urinary symptoms such as dysuria, frequency, urgency, flank pain, hematuria or n/v, fever, chills. But does have weak stream and nocturia worsening.   Wt Readings from Last 3 Encounters:  06/01/23 172 lb (78 kg)  04/27/23 177 lb (80.3 kg)  11/29/22 177 lb (80.3 kg)   BP Readings from Last 3 Encounters:  06/01/23 (!) 160/92  04/27/23 (!) 148/72  11/29/22 136/86   Immunization History  Administered Date(s) Administered   Fluad Quad(high Dose 65+) 12/29/2018, 01/07/2020, 12/24/2020   Influenza Split 01/09/2012   Influenza Whole 03/23/2007, 02/02/2009, 01/13/2010   Influenza, High Dose Seasonal PF 02/07/2014, 01/28/2015, 02/28/2017, 01/19/2018, 01/14/2022   Influenza,inj,Quad PF,6+ Mos 12/05/2012   Moderna Sars-Covid-2 Vaccination 04/16/2019, 05/14/2019, 02/28/2020, 08/01/2020   Pneumococcal Conjugate-13 06/05/2013   Pneumococcal Polysaccharide-23 03/23/2007   Respiratory Syncytial Virus Vaccine,Recomb Aduvanted(Arexvy) 03/09/2022   Td 07/31/2008   Tdap 01/09/2019   Health Maintenance Due   Topic Date Due   Zoster Vaccines- Shingrix (1 of 2) Never done   INFLUENZA VACCINE  11/03/2022   COVID-19 Vaccine (5 - 2024-25 season) 12/04/2022      Past Medical History:  Diagnosis Date   ABNORMAL ELECTROCARDIOGRAM 06/21/2007   ALLERGIC RHINITIS 03/23/2007   Anxiety state, unspecified 09/06/2013   ASTHMATIC BRONCHITIS, ACUTE 04/27/2007   BENIGN PROSTATIC HYPERTROPHY 03/23/2007   BRONCHITIS NOT SPECIFIED AS ACUTE OR CHRONIC 04/21/2007   BURSITIS, RIGHT HIP 07/31/2008   COPD (chronic obstructive pulmonary disease) (HCC) 04/19/2016   Cough 01/29/2009   Dementia (HCC) 09/01/2010   ERECTILE DYSFUNCTION 03/23/2007   ERECTILE DYSFUNCTION, ORGANIC 04/17/2009   Esophageal stricture    FREQUENCY, URINARY 04/26/2010   GERD 03/23/2007   HIATAL HERNIA    HYPERLIPIDEMIA 03/23/2007   HYPERTENSION 03/23/2007   MILD COGNITIVE IMPAIRMENT SO STATED 05/19/2010   NEPHROLITHIASIS, HX OF 03/23/2007   PSA, INCREASED 06/27/2008   RASH-NONVESICULAR 03/23/2007   REACTIVE AIRWAY DISEASE 01/14/2010   SCHATZKI'S RING    SCIATICA, RIGHT 04/28/2008   Unspecified hypothyroidism 09/06/2013   Wheezing 04/17/2009   Past Surgical History:  Procedure Laterality Date   COLONOSCOPY WITH PROPOFOL N/A 11/12/2022   Procedure: COLONOSCOPY WITH PROPOFOL;  Surgeon: Lemar Lofty., MD;  Location: [facility];  Service: Gastroenterology;  Laterality: N/A;   ERCP N/A 12/28/2018   Procedure: ENDOSCOPIC RETROGRADE CHOLANGIOPANCREATOGRAPHY (ERCP);  Surgeon: Meryl Dare, MD;  Location: [facility];  Service: Endoscopy;  Laterality: N/A;  HEMOSTASIS CLIP PLACEMENT  11/12/2022   Procedure: HEMOSTASIS CLIP PLACEMENT;  Surgeon: Lemar Lofty., MD;  Location: [facility];  Service: Gastroenterology;;   POLYPECTOMY  11/12/2022   Procedure: POLYPECTOMY;  Surgeon: Lemar Lofty., MD;  Location: [facility];  Service: Gastroenterology;;   REMOVAL OF STONES  12/28/2018   Procedure: REMOVAL OF STONES;  Surgeon:  Meryl Dare, MD;  Location: [facility];  Service: Endoscopy;;  balloon sweep   SPHINCTEROTOMY  12/28/2018   Procedure: SPHINCTEROTOMY;  Surgeon: Meryl Dare, MD;  Location: [facility];  Service: Endoscopy;;   TONSILLECTOMY      reports that he has never smoked. He has never used smokeless tobacco. He reports that he does not drink alcohol and does not use drugs. family history includes Asthma in his sister; Cancer in an other family member; Emphysema in his brother; Hypertension in an other family member; Lung cancer in his brother. Allergies  Allergen Reactions   Atorvastatin     REACTION: myalgia   Doxazosin Mesylate     REACTION: dizziness   Hydrocodone Bit-Homatrop Mbr Other (See Comments)    anxiety   Hydrocodone Anxiety   Valacyclovir Diarrhea   Current Outpatient Medications on File Prior to Visit  Medication Sig Dispense Refill   aspirin EC 81 MG tablet Take by mouth.     Cholecalciferol (VITAMIN D) 1000 UNITS capsule Take 1,000 Units by mouth every other day.      Cholecalciferol 50 MCG (2000 UT) TABS 1 tab by mouth once daily (Patient taking differently: Take 1 tablet by mouth daily.) 30 tablet 99   desoximetasone (TOPICORT) 0.25 % cream APPLY SPARINGLY TO THE AFFECTED AREA TWICE DAILY AS NEEDED AS DIRECTED (Patient taking differently: Apply 1 application  topically 2 (two) times daily. Itching) 30 g 1   Multiple Vitamin (MULTIVITAMINS PO) Take 1 tablet by mouth daily.     Multiple Vitamins-Minerals (CENTRUM SILVER PO) Take 1 tablet by mouth daily.      polyethylene glycol powder (MIRALAX) powder Take 1/2 capful by mouth once daily (Patient taking differently: Take 17 g by mouth 2 (two) times a week.) 850 g 5   vitamin B-12 (CYANOCOBALAMIN) 100 MCG tablet Take 100 mcg by mouth once a week.     No current facility-administered medications on file prior to visit.        ROS:  All others reviewed and negative.  Objective        PE:  BP (!) 160/92 (BP Location:  Right Arm, Patient Position: Sitting, Cuff Size: Normal)   Pulse 82   Temp 97.8 F (36.6 C) (Oral)   Ht 6' (1.829 m)   Wt 172 lb (78 kg)   SpO2 98%   BMI 23.33 kg/m                 Constitutional: Pt appears in NAD               HENT: Head: NCAT.                Right Ear: External ear normal.                 Left Ear: External ear normal.                Eyes: . Pupils are equal, round, and reactive to light. Conjunctivae and EOM are normal               Nose: without d/c or deformity  Neck: Neck supple. Gross normal ROM               Cardiovascular: Normal rate and regular rhythm.                 Pulmonary/Chest: Effort normal and breath sounds without rales or wheezing.                Abd:  Soft, NT, ND, + BS, no organomegaly               Neurological: Pt is alert. At baseline orientation, motor grossly intact               Skin: Skin is warm. No rashes, no other new lesions, LE edema - trace bilateral               Psychiatric: Pt behavior is normal without agitation   Micro: none  Cardiac tracings I have personally interpreted today:  none  Pertinent Radiological findings (summarize): none   Lab Results  Component Value Date   WBC 6.8 06/01/2023   HGB 13.1 06/01/2023   HCT 39.8 06/01/2023   PLT 250.0 06/01/2023   GLUCOSE 108 (H) 06/01/2023   CHOL 194 06/01/2023   TRIG 207.0 (H) 06/01/2023   HDL 50.60 06/01/2023   LDLDIRECT 74.0 04/25/2022   LDLCALC 102 (H) 06/01/2023   ALT 9 06/01/2023   AST 18 06/01/2023   NA 141 06/01/2023   K 4.4 06/01/2023   CL 102 06/01/2023   CREATININE 1.95 (H) 06/01/2023   BUN 32 (H) 06/01/2023   CO2 32 06/01/2023   TSH 8.57 (H) 06/01/2023   PSA 3.94 03/12/2014   INR 1.1 11/12/2022   HGBA1C 6.1 06/01/2023   MICROALBUR 58.3 (H) 06/01/2023   Assessment/Plan:  JAYMES HANG is a 89 y.o. White or Caucasian [1] male with  has a past medical history of ABNORMAL ELECTROCARDIOGRAM (06/21/2007), ALLERGIC RHINITIS (03/23/2007),  Anxiety state, unspecified (09/06/2013), ASTHMATIC BRONCHITIS, ACUTE (04/27/2007), BENIGN PROSTATIC HYPERTROPHY (03/23/2007), BRONCHITIS NOT SPECIFIED AS ACUTE OR CHRONIC (04/21/2007), BURSITIS, RIGHT HIP (07/31/2008), COPD (chronic obstructive pulmonary disease) (HCC) (04/19/2016), Cough (01/29/2009), Dementia (HCC) (09/01/2010), ERECTILE DYSFUNCTION (03/23/2007), ERECTILE DYSFUNCTION, ORGANIC (04/17/2009), Esophageal stricture, FREQUENCY, URINARY (04/26/2010), GERD (03/23/2007), HIATAL HERNIA, HYPERLIPIDEMIA (03/23/2007), HYPERTENSION (03/23/2007), MILD COGNITIVE IMPAIRMENT SO STATED (05/19/2010), NEPHROLITHIASIS, HX OF (03/23/2007), PSA, INCREASED (06/27/2008), RASH-NONVESICULAR (03/23/2007), REACTIVE AIRWAY DISEASE (01/14/2010), SCHATZKI'S RING, SCIATICA, RIGHT (04/28/2008), Unspecified hypothyroidism (09/06/2013), and Wheezing (04/17/2009).  Encounter for well adult exam with abnormal findings Age and sex appropriate education and counseling updated with regular exercise and diet Referrals for preventative services - none needed Immunizations addressed - declines all immuniations Smoking counseling  - none needed Evidence for depression or other mood disorder - none significant Most recent labs reviewed. I have personally reviewed and have noted: 1) the patient's medical and social history 2) The patient's current medications and supplements 3) The patient's height, weight, and BMI have been recorded in the chart   COPD (chronic obstructive pulmonary disease) (HCC) Mild symptomatic, for restart singulair 10 every day, and change symbicort to once per day trelegy for better compliance  Benign prostatic hyperplasia with urinary frequency Mild symptomatic, for increased flomax 1 bid, and refer urology  BPH associated with nocturia Mild symptomatic, for increased flomax 1 bid, and refer urology  Chronic constipation Also for miralax every day prn  Essential hypertension BP Readings from Last 3  Encounters:  06/01/23 (!) 160/92  04/27/23 (!) 148/72  11/29/22 136/86   uncontrolled pt for  increased losartan 100 qd   GERD Mild to mod, for protonix 40 every day prn,  to f/u any worsening symptoms or concerns  Hyperglycemia Lab Results  Component Value Date   HGBA1C 6.1 06/01/2023   Stable, pt to continue current medical treatment  - diet, wt control   Hyperlipidemia Lab Results  Component Value Date   LDLCALC 102 (H) 06/01/2023   Uncontrolled,, pt to restart crestor 10 qd   PAD (peripheral artery disease) (HCC) With bilateral leg pain - for refer vascular surgury  Vitamin D deficiency Last vitamin D Lab Results  Component Value Date   VD25OH 24.87 (L) 06/01/2023   Low, to start oral replacement  Followup: Return in about 6 months (around 11/29/2023).  Oliver Barre, MD 06/04/2023 3:01 PM [facility]

## 2023-06-01 NOTE — Patient Instructions (Signed)
 Ok to increase the losartan to 100 mg per day  Ok to CHANGE the Symbicort to Trelegy - 1 inhale per day  Ok to increase the Flomax to twice per day  Ok to take miralax at least once per day (17 gm), and with a second dose later in the day if needed  Please continue all other medications as before, and restart the protonix for the reflux.  Please have the pharmacy call with any other refills you may need.  Please continue your efforts at being more active, low cholesterol diet, and weight control.  You are otherwise up to date with prevention measures today.  Please keep your appointments with your specialists as you may have planned  You will be contacted regarding the referral for: Vascular surgury, and Urology  Please go to the LAB at the blood drawing area for the tests to be done  You will be contacted by phone if any changes need to be made immediately.  Otherwise, you will receive a letter about your results with an explanation, but please check with MyChart first.  Please make an Appointment to return in 6 months, or sooner if needed

## 2023-06-04 ENCOUNTER — Encounter: Payer: Self-pay | Admitting: Internal Medicine

## 2023-06-04 NOTE — Assessment & Plan Note (Signed)
 Lab Results  Component Value Date   LDLCALC 102 (H) 06/01/2023   Uncontrolled,, pt to restart crestor 10 qd

## 2023-06-04 NOTE — Assessment & Plan Note (Signed)
 Age and sex appropriate education and counseling updated with regular exercise and diet Referrals for preventative services - none needed Immunizations addressed - declines all immuniations Smoking counseling  - none needed Evidence for depression or other mood disorder - none significant Most recent labs reviewed. I have personally reviewed and have noted: 1) the patient's medical and social history 2) The patient's current medications and supplements 3) The patient's height, weight, and BMI have been recorded in the chart

## 2023-06-04 NOTE — Assessment & Plan Note (Signed)
 Last vitamin D Lab Results  Component Value Date   VD25OH 24.87 (L) 06/01/2023   Low, to start oral replacement

## 2023-06-04 NOTE — Assessment & Plan Note (Signed)
 BP Readings from Last 3 Encounters:  06/01/23 (!) 160/92  04/27/23 (!) 148/72  11/29/22 136/86   uncontrolled pt for increased losartan 100 qd

## 2023-06-04 NOTE — Assessment & Plan Note (Signed)
 With bilateral leg pain - for refer vascular surgury

## 2023-06-04 NOTE — Assessment & Plan Note (Addendum)
 Mild symptomatic, for restart singulair 10 every day, and change symbicort to once per day trelegy for better compliance

## 2023-06-04 NOTE — Assessment & Plan Note (Signed)
 Mild symptomatic, for increased flomax 1 bid, and refer urology

## 2023-06-04 NOTE — Assessment & Plan Note (Signed)
 Lab Results  Component Value Date   HGBA1C 6.1 06/01/2023   Stable, pt to continue current medical treatment  - diet, wt control

## 2023-06-04 NOTE — Assessment & Plan Note (Signed)
 Also for miralax every day prn

## 2023-06-04 NOTE — Assessment & Plan Note (Signed)
 Mild to mod, for protonix 40 every day prn,  to f/u any worsening symptoms or concerns

## 2023-06-28 DIAGNOSIS — Z85828 Personal history of other malignant neoplasm of skin: Secondary | ICD-10-CM | POA: Diagnosis not present

## 2023-06-28 DIAGNOSIS — D692 Other nonthrombocytopenic purpura: Secondary | ICD-10-CM | POA: Diagnosis not present

## 2023-06-28 DIAGNOSIS — L218 Other seborrheic dermatitis: Secondary | ICD-10-CM | POA: Diagnosis not present

## 2023-06-29 ENCOUNTER — Telehealth: Payer: Self-pay | Admitting: Internal Medicine

## 2023-06-29 NOTE — Telephone Encounter (Unsigned)
 Copied from CRM [id number]. Topic: Clinical - Home Health Verbal Orders >> Jun 29, 2023 10:13 AM Deaijah H wrote: Caller/Agency: [facility] Callback Number: [phone] Service Requested: Physical Therapy - Re certification  Frequency: 1w for 9w  Any new concerns about the patient? No

## 2023-06-29 NOTE — Telephone Encounter (Signed)
 Ok for verbal

## 2023-06-30 NOTE — Telephone Encounter (Signed)
 Copied from CRM [id number]. Topic: Clinical - Home Health Verbal Orders >> Jun 29, 2023 10:13 AM Deaijah H wrote: Caller/Agency: [facility] Callback Number: [phone] Service Requested: Physical Therapy - Re certification  Frequency: 1w for 9w  Any new concerns about the patient? No >> Jun 30, 2023  1:41 PM Almira Coaster wrote: Delorise Shiner PT with [facility] is calling to follow up on the request, advised of Dr.John message to provide verbal.

## 2023-07-07 ENCOUNTER — Other Ambulatory Visit: Payer: Self-pay | Admitting: Internal Medicine

## 2023-07-07 MED ORDER — OSELTAMIVIR PHOSPHATE 75 MG PO CAPS: 75.0000 mg | ORAL_CAPSULE | Freq: Two times a day (BID) | ORAL | 0 refills | Status: AC

## 2023-07-12 ENCOUNTER — Other Ambulatory Visit: Payer: Self-pay | Admitting: Internal Medicine

## 2023-07-24 ENCOUNTER — Other Ambulatory Visit: Payer: Self-pay | Admitting: Internal Medicine

## 2023-08-02 ENCOUNTER — Other Ambulatory Visit: Payer: Self-pay

## 2023-08-02 DIAGNOSIS — I739 Peripheral vascular disease, unspecified: Secondary | ICD-10-CM

## 2023-08-07 ENCOUNTER — Ambulatory Visit (HOSPITAL_COMMUNITY)
Admission: RE | Admit: 2023-08-07 | Discharge: 2023-08-07 | Disposition: A | Discharge status: Home / Self Care | Payer: No Typology Code available for payment source | Source: Ambulatory Visit | Attending: Surgery | Admitting: Surgery

## 2023-08-07 ENCOUNTER — Ambulatory Visit: Payer: No Typology Code available for payment source | Attending: Surgery | Admitting: Surgery

## 2023-08-07 ENCOUNTER — Encounter: Payer: Self-pay | Admitting: Surgery

## 2023-08-07 VITALS — BP 151/86 | HR 68 | Temp 98.2°F | Ht 72.0 in | Wt 172.0 lb

## 2023-08-07 DIAGNOSIS — I739 Peripheral vascular disease, unspecified: Secondary | ICD-10-CM

## 2023-08-07 LAB — VAS US ABI WITH/WO TBI
Left ABI: 1.18
Right ABI: 0.77

## 2023-08-07 NOTE — Progress Notes (Signed)
 Vascular and Vein Specialist of   Patient name: Stephen Wilkins MRN: [id number] DOB: [birth year] Sex: male   REQUESTING PROVIDER:    Rosalia Colonel   REASON FOR CONSULT:    PAD  HISTORY OF PRESENT ILLNESS:   Stephen Wilkins is a 89 y.o. male, who is referred for evaluation of PAD.  The patient states that his legs and feet get cold particularly at night however this is corrected by wearing 2 pairs of socks.  He denies any claudication-like symptoms.  He does ambulate with a walker.  He does not have rest pain or nonhealing wounds.  He is a non-smoker.  He takes a statin for hypercholesterolemia.  He is medically managed for hypertension.  PAST MEDICAL HISTORY    Past Medical History:  Diagnosis Date   ABNORMAL ELECTROCARDIOGRAM 06/21/2007   ALLERGIC RHINITIS 03/23/2007   Anxiety state, unspecified 09/06/2013   ASTHMATIC BRONCHITIS, ACUTE 04/27/2007   BENIGN PROSTATIC HYPERTROPHY 03/23/2007   BRONCHITIS NOT SPECIFIED AS ACUTE OR CHRONIC 04/21/2007   BURSITIS, RIGHT HIP 07/31/2008   COPD (chronic obstructive pulmonary disease) (HCC) 04/19/2016   Cough 01/29/2009   Dementia (HCC) 09/01/2010   ERECTILE DYSFUNCTION 03/23/2007   ERECTILE DYSFUNCTION, ORGANIC 04/17/2009   Esophageal stricture    FREQUENCY, URINARY 04/26/2010   GERD 03/23/2007   HIATAL HERNIA    HYPERLIPIDEMIA 03/23/2007   HYPERTENSION 03/23/2007   MILD COGNITIVE IMPAIRMENT SO STATED 05/19/2010   NEPHROLITHIASIS, HX OF 03/23/2007   PSA, INCREASED 06/27/2008   RASH-NONVESICULAR 03/23/2007   REACTIVE AIRWAY DISEASE 01/14/2010   SCHATZKI'S RING    SCIATICA, RIGHT 04/28/2008   Unspecified hypothyroidism 09/06/2013   Wheezing 04/17/2009     FAMILY HISTORY   Family History  Problem Relation Age of Onset   Lung cancer Brother        smoked   Cancer Other        lung cancer   Hypertension Other    Emphysema Brother        smoked   Asthma Sister     SOCIAL HISTORY:   Social  History   Socioeconomic History   Marital status: Married    Spouse name: Not on file   Number of children: Not on file   Years of education: Not on file   Highest education level: Not on file  Occupational History   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    Employer: RETIRED  Tobacco Use   Smoking status: Never   Smokeless tobacco: Never   Tobacco comments:    tried smoking in HS  Vaping Use   Vaping status: Never Used  Substance and Sexual Activity   Alcohol use: No   Drug use: No   Sexual activity: Never  Other Topics Concern   Not on file  Social History Narrative   Not on file   Social Drivers of Health   Financial Resource Strain: Low Risk  (09/22/2022)   Overall Financial Resource Strain (CARDIA)    Difficulty of Paying Living Expenses: Not hard at all  Food Insecurity: No Food Insecurity (11/13/2022)   Hunger Vital Sign    Worried About Running Out of Food in the Last Year: Never true    Ran Out of Food in the Last Year: Never true  Transportation Needs: No Transportation Needs (11/16/2022)   PRAPARE - Administrator, Civil Service (Medical): No    Lack of Transportation (Non-Medical): No  Physical Activity:  Inactive (09/22/2022)   Exercise Vital Sign    Days of Exercise per Week: 0 days    Minutes of Exercise per Session: 0 min  Stress: No Stress Concern Present (09/22/2022)   [employer] of Occupational Health - Occupational Stress Questionnaire    Feeling of Stress : Not at all  Social Connections: Socially Integrated (09/22/2022)   Social Connection and Isolation Panel [NHANES]    Frequency of Communication with Friends and Family: More than three times a week    Frequency of Social Gatherings with Friends and Family: More than three times a week    Attends Religious Services: More than 4 times per year    Active Member of [employer] or Organizations: Yes    Attends Engineer, structural: More than 4  times per year    Marital Status: Married  Catering manager Violence: Not At Risk (11/13/2022)   Humiliation, Afraid, Rape, and Kick questionnaire    Fear of Current or Ex-Partner: No    Emotionally Abused: No    Physically Abused: No    Sexually Abused: No    ALLERGIES:    Allergies  Allergen Reactions   Atorvastatin      REACTION: myalgia   Doxazosin Mesylate     REACTION: dizziness   Hydrocodone  Bit-Homatrop Mbr Other (See Comments)    anxiety   Hydrocodone  Anxiety   Valacyclovir Diarrhea    CURRENT MEDICATIONS:    Current Outpatient Medications  Medication Sig Dispense Refill   albuterol  (VENTOLIN  HFA) 108 (90 Base) MCG/ACT inhaler Inhale 2 puffs into the lungs every 6 (six) hours as needed for shortness of breath. 8 g 11   aspirin  EC 81 MG tablet Take by mouth.     Cholecalciferol  (VITAMIN D ) 1000 UNITS capsule Take 1,000 Units by mouth every other day.      Cholecalciferol  50 MCG (2000 UT) TABS 1 tab by mouth once daily (Patient taking differently: Take 1 tablet by mouth daily.) 30 tablet 99   desoximetasone  (TOPICORT ) 0.25 % cream APPLY SPARINGLY TO THE AFFECTED AREA TWICE DAILY AS NEEDED AS DIRECTED (Patient taking differently: Apply 1 application  topically 2 (two) times daily. Itching) 30 g 1   Fluticasone -Umeclidin-Vilant (TRELEGY ELLIPTA ) 100-62.5-25 MCG/ACT AEPB Inhale 1 puff into the lungs daily. 1 each 11   losartan  (COZAAR ) 100 MG tablet Take 1/2 (one-half) tablet by mouth once daily 45 tablet 0   montelukast  (SINGULAIR ) 10 MG tablet Take 1 tablet (10 mg total) by mouth at bedtime. 90 tablet 3   Multiple Vitamin (MULTIVITAMINS PO) Take 1 tablet by mouth daily.     Multiple Vitamins-Minerals (CENTRUM SILVER PO) Take 1 tablet by mouth daily.      pantoprazole  (PROTONIX ) 40 MG tablet Take 1 tablet (40 mg total) by mouth daily. 90 tablet 3   polyethylene glycol powder (MIRALAX ) powder Take 1/2 capful by mouth once daily (Patient taking differently: Take 17 g by  mouth 2 (two) times a week.) 850 g 5   rosuvastatin  (CRESTOR ) 10 MG tablet Take 1 tablet (10 mg total) by mouth daily. 90 tablet 3   solifenacin  (VESICARE ) 5 MG tablet Take 1 tablet (5 mg total) by mouth daily. 90 tablet 3   tamsulosin  (FLOMAX ) 0.4 MG CAPS capsule Take 1 capsule (0.4 mg total) by mouth in the morning and at bedtime. 180 capsule 3   vitamin B-12 (CYANOCOBALAMIN ) 100 MCG tablet Take 100 mcg by mouth once a week.     No current facility-administered medications for this  visit.    REVIEW OF SYSTEMS:   [X]  denotes positive finding, [ ]  denotes negative finding Cardiac  Comments:  Chest pain or chest pressure:    Shortness of breath upon exertion:    Short of breath when lying flat:    Irregular heart rhythm:        Vascular    Pain in calf, thigh, or hip brought on by ambulation:    Pain in feet at night that wakes you up from your sleep:     Blood clot in your veins:    Leg swelling:         Pulmonary    Oxygen at home:    Productive cough:     Wheezing:         Neurologic    Sudden weakness in arms or legs:     Sudden numbness in arms or legs:     Sudden onset of difficulty speaking or slurred speech:    Temporary loss of vision in one eye:     Problems with dizziness:         Gastrointestinal    Blood in stool:      Vomited blood:         Genitourinary    Burning when urinating:     Blood in urine:        Psychiatric    Major depression:         Hematologic    Bleeding problems:    Problems with blood clotting too easily:        Skin    Rashes or ulcers:        Constitutional    Fever or chills:     PHYSICAL EXAM:   Vitals:   08/07/23 1517  BP: (!) 151/86  Pulse: 68  Temp: 98.2 F (36.8 C)  SpO2: 94%  Weight: 172 lb (78 kg)  Height: 6' (1.829 m)    GENERAL: The patient is a well-nourished male, in no acute distress. The vital signs are documented above. CARDIAC: There is a regular rate and rhythm.  VASCULAR: Nonpalpable pedal  pulses.  1+ edema bilaterally PULMONARY: Nonlabored respirations ABDOMEN: Soft and non-tender with no pulsatile mass MUSCULOSKELETAL: There are no major deformities or cyanosis. NEUROLOGIC: No focal weakness or paresthesias are detected. SKIN: There are no ulcers or rashes noted. PSYCHIATRIC: The patient has a normal affect.  STUDIES:   I have reviewed the following: ABI/TBIToday's ABIToday's TBIPrevious ABIPrevious TBI  +-------+-----------+-----------+------------+------------+  Right 0.77       0.42       0.67        0.52          +-------+-----------+-----------+------------+------------+  Left  1.18       0.46       1.11        0.54          +-------+-----------+-----------+------------+------------+  Right toe pressure: 62 Left toe pressure: 69 Waveforms were monophasic  ASSESSMENT and PLAN   PAD: I discussed with the patient that his ABIs are abnormal bilaterally, right greater than left.  Fortunately he is asymptomatic.  I stressed the importance of evaluating his feet on a daily basis and to contact me should he develop a nonhealing wound.  Otherwise I doubt he will need any intervention for claudication as his ambulation is limited.  I will plan on having him come back in 1 year for surveillance ABI.   Marti Slates, MD, FACS Vascular and Vein  Specialists [facility] [phone] Pager [phone]

## 2023-08-23 ENCOUNTER — Encounter: Payer: Self-pay | Admitting: Internal Medicine

## 2023-08-24 MED ORDER — DONEPEZIL HCL 5 MG PO TABS: 5.0000 mg | ORAL_TABLET | Freq: Every day | ORAL | 11 refills | Status: DC

## 2023-09-12 ENCOUNTER — Other Ambulatory Visit: Payer: Self-pay | Admitting: Internal Medicine

## 2023-09-18 ENCOUNTER — Ambulatory Visit: Payer: Self-pay

## 2023-09-18 NOTE — Telephone Encounter (Signed)
 FYI Only or Action Required?: FYI only for provider  Patient was last seen in primary care on 06/01/2023 by Roslyn Coombe, MD. Called Nurse Triage reporting Abdominal Pain. Symptoms began several weeks ago. Interventions attempted: Nothing. Symptoms are: gradually worsening.  Triage Disposition: See Today or Tomorrow in Office (overriding Go to ED Now (Notify PCP))  Patient/caregiver understands and will follow disposition?:              Copied from CRM [id number]. Topic: Clinical - Red Word Triage >> Sep 18, 2023  1:13 PM Stephen Wilkins I wrote: Red Word that prompted transfer to Nurse Triage: Patient is experiencing worsening dizziness, fatigue, not be able to sleep, and pain in stomach. Reason for Disposition  [1] SEVERE pain AND [2] age > 60 years  Answer Assessment - Initial Assessment Questions 1. LOCATION: Where does it hurt?      Right upper side  2. RADIATION: Does the pain shoot anywhere else? (e.g., chest, back)     No  3. ONSET: When did the pain begin? (Minutes, hours or days ago)      2 weeks  5. PATTERN Does the pain come and go, or is it constant?    - If it comes and goes: How long does it last? Do you have pain now?     (Note: Comes and goes means the pain is intermittent. It goes away completely between bouts.)    - If constant: Is it getting better, staying the same, or getting worse?      (Note: Constant means the pain never goes away completely; most serious pain is constant and gets worse.)      Comes and goes  6. SEVERITY: How bad is the pain?  (e.g., Scale 1-10; mild, moderate, or severe)    - MILD (1-3): Doesn't interfere with normal activities, abdomen soft and not tender to touch.     - MODERATE (4-7): Interferes with normal activities or awakens from sleep, abdomen tender to touch.     - SEVERE (8-10): Excruciating pain, doubled over, unable to do any normal activities.       Comes and goes with eating-8-9  7. RECURRENT SYMPTOM: Have you  ever had this type of stomach pain before? If Yes, ask: When was the last time? and What happened that time?      Has been seen  8. CAUSE: What do you think is causing the stomach pain?     Dehydration-  10. OTHER SYMPTOMS: Do you have any other symptoms? (e.g., back pain, diarrhea, fever, urination pain, vomiting)       Lost weight 20 lbs last 6 months fatigue not sleeping  Protocols used: Abdominal Pain - Male-A-AH

## 2023-09-18 NOTE — Telephone Encounter (Signed)
 Offered patient slot for tomorrow at 10:40 am but patient states he doesn't have a ride and unable to come tomorrow.

## 2023-09-19 ENCOUNTER — Ambulatory Visit: Admitting: Internal Medicine

## 2023-09-20 ENCOUNTER — Encounter: Payer: Self-pay | Admitting: Internal Medicine

## 2023-09-20 ENCOUNTER — Ambulatory Visit (INDEPENDENT_AMBULATORY_CARE_PROVIDER_SITE_OTHER): Admitting: Internal Medicine

## 2023-09-20 VITALS — BP 120/72 | HR 75 | Temp 98.3°F | Ht 72.0 in | Wt 181.0 lb

## 2023-09-20 DIAGNOSIS — J411 Mucopurulent chronic bronchitis: Secondary | ICD-10-CM | POA: Diagnosis not present

## 2023-09-20 DIAGNOSIS — F01B4 Vascular dementia, moderate, with anxiety: Secondary | ICD-10-CM

## 2023-09-20 DIAGNOSIS — E7800 Pure hypercholesterolemia, unspecified: Secondary | ICD-10-CM | POA: Diagnosis not present

## 2023-09-20 DIAGNOSIS — E559 Vitamin D deficiency, unspecified: Secondary | ICD-10-CM

## 2023-09-20 DIAGNOSIS — R739 Hyperglycemia, unspecified: Secondary | ICD-10-CM | POA: Diagnosis not present

## 2023-09-20 LAB — LIPID PANEL
Cholesterol: 125 mg/dL (ref 0–200)
HDL: 46.7 mg/dL (ref >39.00)
LDL Cholesterol: 57 mg/dL (ref 0–99)
NonHDL: 78.06
Total CHOL/HDL Ratio: 3
Triglycerides: 103 mg/dL (ref 0.0–149.0)
VLDL: 20.6 mg/dL (ref 0.0–40.0)

## 2023-09-20 LAB — HEPATIC FUNCTION PANEL
ALT: 8 U/L (ref 0–53)
AST: 16 U/L (ref 0–37)
Albumin: 4 g/dL (ref 3.5–5.2)
Alkaline Phosphatase: 59 U/L (ref 39–117)
Bilirubin, Direct: 0.1 mg/dL (ref 0.0–0.3)
Total Bilirubin: 0.7 mg/dL (ref 0.2–1.2)
Total Protein: 6.3 g/dL (ref 6.0–8.3)

## 2023-09-20 LAB — CBC WITH DIFFERENTIAL/PLATELET
Basophils Absolute: 0 10*3/uL (ref 0.0–0.1)
Basophils Relative: 0.6 % (ref 0.0–3.0)
Eosinophils Absolute: 0.3 10*3/uL (ref 0.0–0.7)
Eosinophils Relative: 4.7 % (ref 0.0–5.0)
HCT: 32 % — ABNORMAL LOW (ref 39.0–52.0)
Hemoglobin: 10.7 g/dL — ABNORMAL LOW (ref 13.0–17.0)
Lymphocytes Relative: 25.1 % (ref 12.0–46.0)
Lymphs Abs: 1.5 10*3/uL (ref 0.7–4.0)
MCHC: 33.4 g/dL (ref 30.0–36.0)
MCV: 93.3 fl (ref 78.0–100.0)
Monocytes Absolute: 0.5 10*3/uL (ref 0.1–1.0)
Monocytes Relative: 8.3 % (ref 3.0–12.0)
Neutro Abs: 3.7 10*3/uL (ref 1.4–7.7)
Neutrophils Relative %: 61.3 % (ref 43.0–77.0)
Platelets: 193 10*3/uL (ref 150.0–400.0)
RBC: 3.43 Mil/uL — ABNORMAL LOW (ref 4.22–5.81)
RDW: 13 % (ref 11.5–15.5)
WBC: 6.1 10*3/uL (ref 4.0–10.5)

## 2023-09-20 LAB — BASIC METABOLIC PANEL WITH GFR
BUN: 43 mg/dL — ABNORMAL HIGH (ref 6–23)
CO2: 33 meq/L — ABNORMAL HIGH (ref 19–32)
Calcium: 9.4 mg/dL (ref 8.4–10.5)
Chloride: 104 meq/L (ref 96–112)
Creatinine, Ser: 2.4 mg/dL — ABNORMAL HIGH (ref 0.40–1.50)
GFR: 22.68 mL/min — ABNORMAL LOW (ref >60.00)
Glucose, Bld: 111 mg/dL — ABNORMAL HIGH (ref 70–99)
Potassium: 5.1 meq/L (ref 3.5–5.1)
Sodium: 140 meq/L (ref 135–145)

## 2023-09-20 LAB — TSH: TSH: 7.96 u[IU]/mL — ABNORMAL HIGH (ref 0.35–5.50)

## 2023-09-20 MED ORDER — DONEPEZIL HCL 10 MG PO TABS: 10.0000 mg | ORAL_TABLET | Freq: Every day | ORAL | 3 refills | Status: DC

## 2023-09-20 NOTE — Patient Instructions (Signed)
 Ok to increase the aricept  (donepezil ) to 10 mg for memory  Ok to take the miralax  up to twice per day if needed  Please continue all other medications as before, and refills have been done if requested.  Please have the pharmacy call with any other refills you may need..  Please keep your appointments with your specialists as you may have planned  Please go to the LAB at the blood drawing area for the tests to be done  You will be contacted by phone if any changes need to be made immediately.  Otherwise, you will receive a letter about your results with an explanation, but please check with MyChart first.  Please make an Appointment to return in 6 months, or sooner if needed

## 2023-09-20 NOTE — Progress Notes (Unsigned)
 Patient ID: Stephen Wilkins, male   DOB: 29-Jan-1928, 89 y.o.   MRN: [id number]        Chief Complaint: follow up HTN, HLD and hyperglycemia, dementia, copd, low vit d       HPI:  Stephen Wilkins is a 89 y.o. male here with son, overall doing well it seems.  Pt denies chest pain, increased sob or doe, wheezing, orthopnea, PND, increased LE swelling, palpitations, dizziness or syncope.   Pt denies polydipsia, polyuria, or new focal neuro s/s.    Pt denies fever, wt loss, night sweats, loss of appetite, or other constitutional symptoms  Does c/o ongoing fatigue, but denies signficant daytime hypersomnolence.  Dementia overall stable symptomatically, and not assoc with behavioral changes such as hallucinations, paranoia, or agitation.  Denies worsening depressive symptoms, suicidal ideation, or panic;       Wt Readings from Last 3 Encounters:  09/20/23 181 lb (82.1 kg)  08/07/23 172 lb (78 kg)  06/01/23 172 lb (78 kg)   BP Readings from Last 3 Encounters:  09/20/23 120/72  08/07/23 (!) 151/86  06/01/23 (!) 160/92         Past Medical History:  Diagnosis Date   ABNORMAL ELECTROCARDIOGRAM 06/21/2007   ALLERGIC RHINITIS 03/23/2007   Anxiety state, unspecified 09/06/2013   ASTHMATIC BRONCHITIS, ACUTE 04/27/2007   BENIGN PROSTATIC HYPERTROPHY 03/23/2007   BRONCHITIS NOT SPECIFIED AS ACUTE OR CHRONIC 04/21/2007   BURSITIS, RIGHT HIP 07/31/2008   COPD (chronic obstructive pulmonary disease) (HCC) 04/19/2016   Cough 01/29/2009   Dementia (HCC) 09/01/2010   ERECTILE DYSFUNCTION 03/23/2007   ERECTILE DYSFUNCTION, ORGANIC 04/17/2009   Esophageal stricture    FREQUENCY, URINARY 04/26/2010   GERD 03/23/2007   HIATAL HERNIA    HYPERLIPIDEMIA 03/23/2007   HYPERTENSION 03/23/2007   MILD COGNITIVE IMPAIRMENT SO STATED 05/19/2010   NEPHROLITHIASIS, HX OF 03/23/2007   PSA, INCREASED 06/27/2008   RASH-NONVESICULAR 03/23/2007   REACTIVE AIRWAY DISEASE 01/14/2010   SCHATZKI'S RING    SCIATICA, RIGHT 04/28/2008    Unspecified hypothyroidism 09/06/2013   Wheezing 04/17/2009   Past Surgical History:  Procedure Laterality Date   COLONOSCOPY WITH PROPOFOL  N/A 11/12/2022   Procedure: COLONOSCOPY WITH PROPOFOL ;  Surgeon: Wilhelmenia Aloha Raddle., MD;  Location: [facility];  Service: Gastroenterology;  Laterality: N/A;   ERCP N/A 12/28/2018   Procedure: ENDOSCOPIC RETROGRADE CHOLANGIOPANCREATOGRAPHY (ERCP);  Surgeon: Aneita Gwendlyn DASEN, MD;  Location: [facility];  Service: Endoscopy;  Laterality: N/A;   HEMOSTASIS CLIP PLACEMENT  11/12/2022   Procedure: HEMOSTASIS CLIP PLACEMENT;  Surgeon: Wilhelmenia Aloha Raddle., MD;  Location: [facility];  Service: Gastroenterology;;   POLYPECTOMY  11/12/2022   Procedure: POLYPECTOMY;  Surgeon: Wilhelmenia Aloha Raddle., MD;  Location: [facility];  Service: Gastroenterology;;   REMOVAL OF STONES  12/28/2018   Procedure: REMOVAL OF STONES;  Surgeon: Aneita Gwendlyn DASEN, MD;  Location: [facility];  Service: Endoscopy;;  balloon sweep   SPHINCTEROTOMY  12/28/2018   Procedure: SPHINCTEROTOMY;  Surgeon: Aneita Gwendlyn DASEN, MD;  Location: [facility];  Service: Endoscopy;;   TONSILLECTOMY      reports that he has never smoked. He has never used smokeless tobacco. He reports that he does not drink alcohol and does not use drugs. family history includes Asthma in his sister; Cancer in an other family member; Emphysema in his brother; Hypertension in an other family member; Lung cancer in his brother. Allergies  Allergen Reactions   Atorvastatin      REACTION: myalgia   Doxazosin Mesylate  REACTION: dizziness   Hydrocodone  Bit-Homatrop Mbr Other (See Comments)    anxiety   Hydrocodone  Anxiety   Valacyclovir Diarrhea   Current Outpatient Medications on File Prior to Visit  Medication Sig Dispense Refill   albuterol  (VENTOLIN  HFA) 108 (90 Base) MCG/ACT inhaler Inhale 2 puffs into the lungs every 6 (six) hours as needed for shortness of breath. 8 g 11   aspirin  EC 81 MG tablet  Take by mouth.     Cholecalciferol  (VITAMIN D ) 1000 UNITS capsule Take 1,000 Units by mouth every other day.      Cholecalciferol  50 MCG (2000 UT) TABS 1 tab by mouth once daily (Patient taking differently: Take 1 tablet by mouth daily.) 30 tablet 99   clobetasol (TEMOVATE) 0.05 % external solution Apply 1ml TO SCALP twice a DAY FOR 1-2 WEEKS; THEN USE as needed.     desoximetasone  (TOPICORT ) 0.25 % cream APPLY SPARINGLY TO THE AFFECTED AREA TWICE DAILY AS NEEDED AS DIRECTED (Patient taking differently: Apply 1 application  topically 2 (two) times daily. Itching) 30 g 1   Fluticasone -Umeclidin-Vilant (TRELEGY ELLIPTA ) 100-62.5-25 MCG/ACT AEPB Inhale 1 puff into the lungs daily. 1 each 11   losartan  (COZAAR ) 100 MG tablet Take 1/2 (one-half) tablet by mouth once daily 45 tablet 0   montelukast  (SINGULAIR ) 10 MG tablet Take 1 tablet (10 mg total) by mouth at bedtime. 90 tablet 3   Multiple Vitamin (MULTIVITAMINS PO) Take 1 tablet by mouth daily.     Multiple Vitamins-Minerals (CENTRUM SILVER PO) Take 1 tablet by mouth daily.      pantoprazole  (PROTONIX ) 40 MG tablet Take 1 tablet (40 mg total) by mouth daily. 90 tablet 3   polyethylene glycol powder (MIRALAX ) powder Take 1/2 capful by mouth once daily (Patient taking differently: Take 17 g by mouth 2 (two) times a week.) 850 g 5   rosuvastatin  (CRESTOR ) 10 MG tablet Take 1 tablet (10 mg total) by mouth daily. 90 tablet 3   solifenacin  (VESICARE ) 5 MG tablet Take 1 tablet (5 mg total) by mouth daily. 90 tablet 3   tamsulosin  (FLOMAX ) 0.4 MG CAPS capsule Take 1 capsule (0.4 mg total) by mouth in the morning and at bedtime. 180 capsule 3   vitamin B-12 (CYANOCOBALAMIN ) 100 MCG tablet Take 100 mcg by mouth once a week.     No current facility-administered medications on file prior to visit.        ROS:  All others reviewed and negative.  Objective        PE:  BP 120/72 (BP Location: Left Arm, Patient Position: Sitting, Cuff Size: Normal)   Pulse 75    Temp 98.3 F (36.8 C) (Oral)   Ht 6' (1.829 m)   Wt 181 lb (82.1 kg)   SpO2 98%   BMI 24.55 kg/m                 Constitutional: Pt appears in NAD               HENT: Head: NCAT.                Right Ear: External ear normal.                 Left Ear: External ear normal.                Eyes: . Pupils are equal, round, and reactive to light. Conjunctivae and EOM are normal  Nose: without d/c or deformity               Neck: Neck supple. Gross normal ROM               Cardiovascular: Normal rate and regular rhythm.                 Pulmonary/Chest: Effort normal and breath sounds without rales or wheezing.                Abd:  Soft, NT, ND, + BS, no organomegaly               Neurological: Pt is alert. At baseline orientation, motor grossly intact               Skin: Skin is warm. No rashes, no other new lesions, LE edema - none               Psychiatric: Pt behavior is normal without agitation   Micro: none  Cardiac tracings I have personally interpreted today:  none  Pertinent Radiological findings (summarize): none   Lab Results  Component Value Date   WBC 6.1 09/20/2023   HGB 10.7 (L) 09/20/2023   HCT 32.0 (L) 09/20/2023   PLT 193.0 09/20/2023   GLUCOSE 111 (H) 09/20/2023   CHOL 125 09/20/2023   TRIG 103.0 09/20/2023   HDL 46.70 09/20/2023   LDLDIRECT 74.0 04/25/2022   LDLCALC 57 09/20/2023   ALT 8 09/20/2023   AST 16 09/20/2023   NA 140 09/20/2023   K 5.1 09/20/2023   CL 104 09/20/2023   CREATININE 2.40 (H) 09/20/2023   BUN 43 (H) 09/20/2023   CO2 33 (H) 09/20/2023   TSH 7.96 (H) 09/20/2023   PSA 3.94 03/12/2014   INR 1.1 11/12/2022   HGBA1C 6.4 09/20/2023   MICROALBUR 58.3 (H) 06/01/2023   Assessment/Plan:  Stephen Wilkins is a 79 y.o. White or Caucasian [1] male with  has a past medical history of ABNORMAL ELECTROCARDIOGRAM (06/21/2007), ALLERGIC RHINITIS (03/23/2007), Anxiety state, unspecified (09/06/2013), ASTHMATIC BRONCHITIS, ACUTE  (04/27/2007), BENIGN PROSTATIC HYPERTROPHY (03/23/2007), BRONCHITIS NOT SPECIFIED AS ACUTE OR CHRONIC (04/21/2007), BURSITIS, RIGHT HIP (07/31/2008), COPD (chronic obstructive pulmonary disease) (HCC) (04/19/2016), Cough (01/29/2009), Dementia (HCC) (09/01/2010), ERECTILE DYSFUNCTION (03/23/2007), ERECTILE DYSFUNCTION, ORGANIC (04/17/2009), Esophageal stricture, FREQUENCY, URINARY (04/26/2010), GERD (03/23/2007), HIATAL HERNIA, HYPERLIPIDEMIA (03/23/2007), HYPERTENSION (03/23/2007), MILD COGNITIVE IMPAIRMENT SO STATED (05/19/2010), NEPHROLITHIASIS, HX OF (03/23/2007), PSA, INCREASED (06/27/2008), RASH-NONVESICULAR (03/23/2007), REACTIVE AIRWAY DISEASE (01/14/2010), SCHATZKI'S RING, SCIATICA, RIGHT (04/28/2008), Unspecified hypothyroidism (09/06/2013), and Wheezing (04/17/2009).  Dementia (HCC) Gradually worsening without worsening behavior - for increased aricept  10 mg every day, declines increase fo rnow  COPD (chronic obstructive pulmonary disease) (HCC) Stable, cont inhaler prn  Vitamin D  deficiency Last vitamin D  Lab Results  Component Value Date   VD25OH 24.87 (L) 06/01/2023   Low, to start oral replacement   Hyperlipidemia Lab Results  Component Value Date   LDLCALC 57 09/20/2023   Stable, pt to continue current statin crestor  10 mg qd   Hyperglycemia Lab Results  Component Value Date   HGBA1C 6.4 09/20/2023   Stable, pt to continue current medical treatment  - diet, wt control  Followup: Return in about 6 months (around 03/21/2024).  Lynwood Rush, MD 09/23/2023 8:55 AM [facility]

## 2023-09-21 ENCOUNTER — Other Ambulatory Visit: Payer: Self-pay | Admitting: Internal Medicine

## 2023-09-21 ENCOUNTER — Ambulatory Visit: Payer: Self-pay | Admitting: Internal Medicine

## 2023-09-21 DIAGNOSIS — N184 Chronic kidney disease, stage 4 (severe): Secondary | ICD-10-CM

## 2023-09-21 LAB — HEMOGLOBIN A1C: Hgb A1c MFr Bld: 6.4 % (ref 4.6–6.5)

## 2023-09-23 ENCOUNTER — Encounter: Payer: Self-pay | Admitting: Internal Medicine

## 2023-09-23 NOTE — Assessment & Plan Note (Signed)
 Last vitamin D Lab Results  Component Value Date   VD25OH 24.87 (L) 06/01/2023   Low, to start oral replacement

## 2023-09-23 NOTE — Assessment & Plan Note (Signed)
 Lab Results  Component Value Date   HGBA1C 6.4 09/20/2023   Stable, pt to continue current medical treatment  - diet, wt control

## 2023-09-23 NOTE — Assessment & Plan Note (Signed)
 Lab Results  Component Value Date   LDLCALC 57 09/20/2023   Stable, pt to continue current statin crestor  10 mg qd

## 2023-09-23 NOTE — Assessment & Plan Note (Signed)
 Stable , cont inhaler prn

## 2023-09-23 NOTE — Assessment & Plan Note (Addendum)
 Gradually worsening without worsening behavior - for increased aricept  10 mg every day, declines increase fo rnow

## 2023-10-02 ENCOUNTER — Ambulatory Visit (INDEPENDENT_AMBULATORY_CARE_PROVIDER_SITE_OTHER): Payer: No Typology Code available for payment source

## 2023-10-02 VITALS — Ht 72.0 in | Wt 181.0 lb

## 2023-10-02 DIAGNOSIS — Z0000 Encounter for general adult medical examination without abnormal findings: Secondary | ICD-10-CM

## 2023-10-02 NOTE — Patient Instructions (Signed)
 Stephen Wilkins , Thank you for taking time out of your busy schedule to complete your Annual Wellness Visit with me. I enjoyed our conversation and look forward to speaking with you again next year. I, as well as your care team,  appreciate your ongoing commitment to your health goals. Please review the following plan we discussed and let me know if I can assist you in the future. Your Game plan/ To Do List   Follow up Visits: Next Medicare AWV with our clinical staff: 10/02/2024.   Have you seen your provider in the last 6 months (3 months if uncontrolled diabetes)? Yes Next Office Visit with your provider: 12/05/2023.  Clinician Recommendations:  Aim for 30 minutes of exercise or brisk walking, 6-8 glasses of water, and 5 servings of fruits and vegetables each day. You are due fro a Shingles vaccine and can get that done at your local pharmacy.        This is a list of the screening recommended for you and due dates:  Health Maintenance  Topic Date Due   Zoster (Shingles) Vaccine (1 of 2) Never done   COVID-19 Vaccine (5 - 2024-25 season) 12/04/2022   Flu Shot  11/03/2023   Medicare Annual Wellness Visit  10/01/2024   DTaP/Tdap/Td vaccine (3 - Td or Tdap) 01/08/2029   Pneumococcal Vaccine for age over 43  Completed   Hepatitis B Vaccine  Aged Out   HPV Vaccine  Aged Out   Meningitis B Vaccine  Aged Out    Advanced directives: (Copy Requested) Please bring a copy of your health care power of attorney and living will to the office to be added to your chart at your convenience. You can mail to [facility] [address] [place], [place] [address] or email to [email or web address] Advance Care Planning is important because it:  [x]  Makes sure you receive the medical care that is consistent with your values, goals, and preferences  [x]  It provides guidance to your family and loved ones and reduces their decisional burden about whether or not they are making the right  decisions based on your wishes.  Follow the link provided in your after visit summary or read over the paperwork we have mailed to you to help you started getting your Advance Directives in place. If you need assistance in completing these, please reach out to us  so that we can help you!  See attachments for Preventive Care and Fall Prevention Tips.

## 2023-10-02 NOTE — Progress Notes (Signed)
 Subjective:   Stephen Wilkins is a 89 y.o. who presents for a Medicare Wellness preventive visit.  As a reminder, Annual Wellness Visits don't include a physical exam, and some assessments may be limited, especially if this visit is performed virtually. We may recommend an in-person follow-up visit with your provider if needed.  Visit Complete: Virtual I connected with  [name] on 10/02/23 by a audio enabled telemedicine application and verified that I am speaking with the correct person using two identifiers.  Patient Location: Home  Provider Location: Office/Clinic  I discussed the limitations of evaluation and management by telemedicine. The patient expressed understanding and agreed to proceed.  Vital Signs: Because this visit was a virtual/telehealth visit, some criteria may be missing or patient reported. Any vitals not documented were not able to be obtained and vitals that have been documented are patient reported.  VideoDeclined- This patient declined Librarian, academic. Therefore the visit was completed with audio only.  Persons Participating in Visit: Patient.  AWV Questionnaire: No: Patient Medicare AWV questionnaire was not completed prior to this visit.  Cardiac Risk Factors include: advanced age (>65men, >39 women);male gender;hypertension     Objective:    Today's Vitals   10/02/23 1102  Weight: 181 lb (82.1 kg)  Height: 6' (1.829 m)   Body mass index is 24.55 kg/m.     10/02/2023   11:06 AM 04/27/2023    6:45 AM 11/11/2022    4:45 PM 11/11/2022   10:22 AM 09/22/2022    2:21 PM 06/09/2022    1:42 PM 03/28/2021   11:45 AM  Advanced Directives  Does Patient Have a Medical Advance Directive? Yes No No No Yes No Yes  Type of Estate agent of [place];Living will    Healthcare Power of [place];Living will    Copy of Healthcare Power of Attorney in Chart? No - copy requested    No - copy requested    Would patient  like information on creating a medical advance directive?   No - Patient declined No - Patient declined  No - Patient declined     Current Medications (verified) Outpatient Encounter Medications as of 10/02/2023  Medication Sig   albuterol  (VENTOLIN  HFA) 108 (90 Base) MCG/ACT inhaler Inhale 2 puffs into the lungs every 6 (six) hours as needed for shortness of breath.   aspirin  EC 81 MG tablet Take by mouth.   Cholecalciferol  (VITAMIN D ) 1000 UNITS capsule Take 1,000 Units by mouth every other day.    Cholecalciferol  50 MCG (2000 UT) TABS 1 tab by mouth once daily (Patient taking differently: Take 1 tablet by mouth daily.)   clobetasol (TEMOVATE) 0.05 % external solution Apply 1ml TO SCALP twice a DAY FOR 1-2 WEEKS; THEN USE as needed.   desoximetasone  (TOPICORT ) 0.25 % cream APPLY SPARINGLY TO THE AFFECTED AREA TWICE DAILY AS NEEDED AS DIRECTED (Patient taking differently: Apply 1 application  topically 2 (two) times daily. Itching)   donepezil  (ARICEPT ) 10 MG tablet Take 1 tablet (10 mg total) by mouth daily.   Fluticasone -Umeclidin-Vilant (TRELEGY ELLIPTA ) 100-62.5-25 MCG/ACT AEPB Inhale 1 puff into the lungs daily.   losartan  (COZAAR ) 100 MG tablet Take 1/2 (one-half) tablet by mouth once daily   montelukast  (SINGULAIR ) 10 MG tablet Take 1 tablet (10 mg total) by mouth at bedtime.   Multiple Vitamin (MULTIVITAMINS PO) Take 1 tablet by mouth daily.   Multiple Vitamins-Minerals (CENTRUM SILVER PO) Take 1 tablet by mouth daily.  pantoprazole  (PROTONIX ) 40 MG tablet Take 1 tablet (40 mg total) by mouth daily.   polyethylene glycol powder (MIRALAX ) powder Take 1/2 capful by mouth once daily (Patient taking differently: Take 17 g by mouth 2 (two) times a week.)   rosuvastatin  (CRESTOR ) 10 MG tablet Take 1 tablet (10 mg total) by mouth daily.   solifenacin  (VESICARE ) 5 MG tablet Take 1 tablet (5 mg total) by mouth daily.   tamsulosin  (FLOMAX ) 0.4 MG CAPS capsule Take 1 capsule (0.4 mg total) by  mouth in the morning and at bedtime.   vitamin B-12 (CYANOCOBALAMIN ) 100 MCG tablet Take 100 mcg by mouth once a week.   No facility-administered encounter medications on file as of 10/02/2023.    Allergies (verified) Atorvastatin , Doxazosin mesylate, Hydrocodone  bit-homatrop mbr, Hydrocodone , and Valacyclovir   History: Past Medical History:  Diagnosis Date   ABNORMAL ELECTROCARDIOGRAM 06/21/2007   ALLERGIC RHINITIS 03/23/2007   Anxiety state, unspecified 09/06/2013   ASTHMATIC BRONCHITIS, ACUTE 04/27/2007   BENIGN PROSTATIC HYPERTROPHY 03/23/2007   BRONCHITIS NOT SPECIFIED AS ACUTE OR CHRONIC 04/21/2007   BURSITIS, RIGHT HIP 07/31/2008   COPD (chronic obstructive pulmonary disease) (HCC) 04/19/2016   Cough 01/29/2009   Dementia (HCC) 09/01/2010   ERECTILE DYSFUNCTION 03/23/2007   ERECTILE DYSFUNCTION, ORGANIC 04/17/2009   Esophageal stricture    FREQUENCY, URINARY 04/26/2010   GERD 03/23/2007   HIATAL HERNIA    HYPERLIPIDEMIA 03/23/2007   HYPERTENSION 03/23/2007   MILD COGNITIVE IMPAIRMENT SO STATED 05/19/2010   NEPHROLITHIASIS, HX OF 03/23/2007   PSA, INCREASED 06/27/2008   RASH-NONVESICULAR 03/23/2007   REACTIVE AIRWAY DISEASE 01/14/2010   SCHATZKI'S RING    SCIATICA, RIGHT 04/28/2008   Unspecified hypothyroidism 09/06/2013   Wheezing 04/17/2009   Past Surgical History:  Procedure Laterality Date   COLONOSCOPY WITH PROPOFOL  N/A 11/12/2022   Procedure: COLONOSCOPY WITH PROPOFOL ;  Surgeon: Wilhelmenia Aloha Raddle., MD;  Location: [facility];  Service: Gastroenterology;  Laterality: N/A;   ERCP N/A 12/28/2018   Procedure: ENDOSCOPIC RETROGRADE CHOLANGIOPANCREATOGRAPHY (ERCP);  Surgeon: Aneita Gwendlyn DASEN, MD;  Location: [facility];  Service: Endoscopy;  Laterality: N/A;   HEMOSTASIS CLIP PLACEMENT  11/12/2022   Procedure: HEMOSTASIS CLIP PLACEMENT;  Surgeon: Wilhelmenia Aloha Raddle., MD;  Location: [facility];  Service: Gastroenterology;;   POLYPECTOMY  11/12/2022   Procedure:  POLYPECTOMY;  Surgeon: Wilhelmenia Aloha Raddle., MD;  Location: [facility];  Service: Gastroenterology;;   REMOVAL OF STONES  12/28/2018   Procedure: REMOVAL OF STONES;  Surgeon: Aneita Gwendlyn DASEN, MD;  Location: [facility];  Service: Endoscopy;;  balloon sweep   SPHINCTEROTOMY  12/28/2018   Procedure: SPHINCTEROTOMY;  Surgeon: Aneita Gwendlyn DASEN, MD;  Location: [facility];  Service: Endoscopy;;   TONSILLECTOMY     Family History  Problem Relation Age of Onset   Lung cancer Brother        smoked   Cancer Other        lung cancer   Hypertension Other    Emphysema Brother        smoked   Asthma Sister    Social History   Socioeconomic History   Marital status: Married    Spouse name: Rockie   Number of children: 2   Years of education: Not on file   Highest education level: Not on file  Occupational History   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    Employer: RETIRED  Tobacco Use   Smoking status: Never   Smokeless tobacco: Never  Tobacco comments:    tried smoking in HS  Vaping Use   Vaping status: Never Used  Substance and Sexual Activity   Alcohol use: No   Drug use: No   Sexual activity: Never  Other Topics Concern   Not on file  Social History Narrative   Lives with wife/2025   Social Drivers of Health   Financial Resource Strain: Low Risk  (10/02/2023)   Overall Financial Resource Strain (CARDIA)    Difficulty of Paying Living Expenses: Not hard at all  Food Insecurity: No Food Insecurity (10/02/2023)   Hunger Vital Sign    Worried About Running Out of Food in the Last Year: Never true    Ran Out of Food in the Last Year: Never true  Transportation Needs: No Transportation Needs (10/02/2023)   PRAPARE - Administrator, Civil Service (Medical): No    Lack of Transportation (Non-Medical): No  Physical Activity: Inactive (10/02/2023)   Exercise Vital Sign    Days of Exercise per Week: 0 days    Minutes of  Exercise per Session: 0 min  Stress: No Stress Concern Present (10/02/2023)   [employer] of Occupational Health - Occupational Stress Questionnaire    Feeling of Stress: Only a little  Social Connections: Socially Isolated (10/02/2023)   Social Connection and Isolation Panel    Frequency of Communication with Friends and Family: Twice a week    Frequency of Social Gatherings with Friends and Family: Never    Attends Religious Services: Never    Diplomatic Services operational officer: No    Attends Engineer, structural: Never    Marital Status: Married    Tobacco Counseling Counseling given: Not Answered Tobacco comments: tried smoking in HS    Clinical Intake:  Pre-visit preparation completed: Yes  Pain : No/denies pain     BMI - recorded: 24.55 Nutritional Status: BMI of 19-24  Normal Nutritional Risks: None Diabetes: No  Lab Results  Component Value Date   HGBA1C 6.4 09/20/2023   HGBA1C 6.1 06/01/2023   HGBA1C 6.2 11/08/2022     How often do you need to have someone help you when you read instructions, pamphlets, or other written materials from your doctor or pharmacy?: 1 - Never  Interpreter Needed?: No  Information entered by :: Yoshiko Keleher, RMA   Activities of Daily Living     10/02/2023   11:04 AM 11/11/2022    7:55 PM  In your present state of health, do you have any difficulty performing the following activities:  Hearing? 0   Vision? 0   Difficulty concentrating or making decisions? 0   Walking or climbing stairs? 0   Dressing or bathing? 0   Doing errands, shopping? 0 0  Comment wife drives   Preparing Food and eating ? N   Using the Toilet? N   In the past six months, have you accidently leaked urine? N   Do you have problems with loss of bowel control? N   Managing your Medications? N   Managing your Finances? N   Housekeeping or managing your Housekeeping? N     Patient Care Team: Norleen Lynwood ORN, MD as PCP - General  I  have updated your Care Teams any recent Medical Services you may have received from other providers in the past year.     Assessment:   This is a routine wellness examination for K[place].  Hearing/Vision screen Hearing Screening - Comments:: Wears hearing aides Vision  Screening - Comments:: Wears eyeglasses/Dr. Waylan   Goals Addressed             This Visit's Progress    Patient Stated   On track    Stay as active and as independent as possible. Continue to go to the gym several times per week.       Depression Screen     10/02/2023   11:10 AM 09/20/2023    2:50 PM 06/01/2023   10:48 AM 11/08/2022    2:18 PM 10/03/2022    3:16 PM 09/22/2022    2:18 PM 06/02/2022    3:10 PM  PHQ 2/9 Scores  PHQ - 2 Score 0 0 0 0 0 0 0  PHQ- 9 Score 1      0    Fall Risk     10/02/2023   11:06 AM 09/20/2023    2:57 PM 06/01/2023   10:57 AM 11/08/2022    2:18 PM 10/03/2022    3:16 PM  Fall Risk   Falls in the past year? 0 0 0 1 0  Number falls in past yr: 0 0 0 1 0  Injury with Fall? 0 0 0 0 0  Risk for fall due to :  No Fall Risks No Fall Risks Other (Comment) No Fall Risks  Follow up Falls evaluation completed;Falls prevention discussed Falls evaluation completed Falls evaluation completed Falls evaluation completed Falls evaluation completed    MEDICARE RISK AT HOME:  Medicare Risk at Home Any stairs in or around the home?: Yes (2 steps at front door) If so, are there any without handrails?: No Home free of loose throw rugs in walkways, pet beds, electrical cords, etc?: Yes Adequate lighting in your home to reduce risk of falls?: Yes Life alert?: No Use of a cane, walker or w/c?: Yes (walker and cane) Grab bars in the bathroom?: Yes Shower chair or bench in shower?: Yes Elevated toilet seat or a handicapped toilet?: Yes  TIMED UP AND GO:  Was the test performed?  No  Cognitive Function: 6CIT completed    04/13/2017    4:04 PM  MMSE - Mini Mental State Exam  Orientation to time 5    Orientation to Place 5   Registration 3   Attention/ Calculation 5   Recall 1   Language- name 2 objects 2   Language- repeat 1  Language- follow 3 step command 3   Language- read & follow direction 1   Write a sentence 1   Copy design 1   Total score 28      Data saved with a previous flowsheet row definition        10/02/2023   11:07 AM 09/22/2022    2:21 PM 11/07/2019    9:39 AM  6CIT Screen  What Year? 0 points 0 points 0 points  What month? 0 points 0 points 0 points  What time? 0 points 0 points 3 points  Count back from 20 0 points 2 points 0 points  Months in reverse 0 points 2 points 0 points  Repeat phrase 10 points 4 points 4 points  Total Score 10 points 8 points 7 points    Immunizations Immunization History  Administered Date(s) Administered   Fluad Quad(high Dose 65+) 12/29/2018, 01/07/2020, 12/24/2020   Influenza Split 01/09/2012   Influenza Whole 03/23/2007, 02/02/2009, 01/13/2010   Influenza, High Dose Seasonal PF 02/07/2014, 01/28/2015, 02/28/2017, 01/19/2018, 01/14/2022   Influenza,inj,Quad PF,6+ Mos 12/05/2012   Moderna Sars-Covid-2 Vaccination 04/16/2019,  05/14/2019, 02/28/2020, 08/01/2020   Pneumococcal Conjugate-13 06/05/2013   Pneumococcal Polysaccharide-23 03/23/2007   Respiratory Syncytial Virus Vaccine ,Recomb Aduvanted(Arexvy ) 03/09/2022   Td 07/31/2008   Tdap 01/09/2019    Screening Tests Health Maintenance  Topic Date Due   Zoster Vaccines- Shingrix (1 of 2) Never done   COVID-19 Vaccine (5 - 2024-25 season) 12/04/2022   INFLUENZA VACCINE  11/03/2023   Medicare Annual Wellness (AWV)  10/01/2024   DTaP/Tdap/Td (3 - Td or Tdap) 01/08/2029   Pneumococcal Vaccine: 50+ Years  Completed   Hepatitis B Vaccines  Aged Out   HPV VACCINES  Aged Out   Meningococcal B Vaccine  Aged Out    Health Maintenance  Health Maintenance Due  Topic Date Due   Zoster Vaccines- Shingrix (1 of 2) Never done   COVID-19 Vaccine (5 - 2024-25 season)  12/04/2022   Health Maintenance Items Addressed: See Nurse Notes at the end of this note  Additional Screening:  Vision Screening: Recommended annual ophthalmology exams for early detection of glaucoma and other disorders of the eye. Would you like a referral to an eye doctor? No    Dental Screening: Recommended annual dental exams for proper oral hygiene  Community Resource Referral / Chronic Care Management: CRR required this visit?  No   CCM required this visit?  No   Plan:    I have personally reviewed and noted the following in the patient's chart:   Medical and social history Use of alcohol, tobacco or illicit drugs  Current medications and supplements including opioid prescriptions. Patient is not currently taking opioid prescriptions. Functional ability and status Nutritional status Physical activity Advanced directives List of other physicians Hospitalizations, surgeries, and ER visits in previous 12 months Vitals Screenings to include cognitive, depression, and falls Referrals and appointments  In addition, I have reviewed and discussed with patient certain preventive protocols, quality metrics, and best practice recommendations. A written personalized care plan for preventive services as well as general preventive health recommendations were provided to patient.   Kadee Philyaw L Anarie Kalish, CMA   10/02/2023   After Visit Summary: (MyChart) Due to this being a telephonic visit, the after visit summary with patients personalized plan was offered to patient via MyChart   Notes: Patient is due for a Shingrix vaccine.  Patient is up to date on all other health maintenance with no concerns to address today.

## 2023-10-09 NOTE — Telephone Encounter (Signed)
 Called patients Wife Khanh Tanori, informing her forms are ready for pick up. She understood

## 2023-10-13 NOTE — Telephone Encounter (Signed)
 Forms have been placed up front in envelope for pick up

## 2023-10-16 DIAGNOSIS — H5310 Unspecified subjective visual disturbances: Secondary | ICD-10-CM | POA: Diagnosis not present

## 2023-10-16 DIAGNOSIS — H1820 Unspecified corneal edema: Secondary | ICD-10-CM | POA: Diagnosis not present

## 2023-10-19 DIAGNOSIS — H1820 Unspecified corneal edema: Secondary | ICD-10-CM | POA: Diagnosis not present

## 2023-10-19 DIAGNOSIS — H1789 Other corneal scars and opacities: Secondary | ICD-10-CM | POA: Diagnosis not present

## 2023-10-19 DIAGNOSIS — H5310 Unspecified subjective visual disturbances: Secondary | ICD-10-CM | POA: Diagnosis not present

## 2023-10-23 DIAGNOSIS — H5310 Unspecified subjective visual disturbances: Secondary | ICD-10-CM | POA: Diagnosis not present

## 2023-10-23 DIAGNOSIS — H5340 Unspecified visual field defects: Secondary | ICD-10-CM | POA: Diagnosis not present

## 2023-10-24 DIAGNOSIS — D631 Anemia in chronic kidney disease: Secondary | ICD-10-CM | POA: Diagnosis not present

## 2023-10-24 DIAGNOSIS — I129 Hypertensive chronic kidney disease with stage 1 through stage 4 chronic kidney disease, or unspecified chronic kidney disease: Secondary | ICD-10-CM | POA: Diagnosis not present

## 2023-10-24 DIAGNOSIS — E875 Hyperkalemia: Secondary | ICD-10-CM | POA: Diagnosis not present

## 2023-10-24 DIAGNOSIS — N400 Enlarged prostate without lower urinary tract symptoms: Secondary | ICD-10-CM | POA: Diagnosis not present

## 2023-10-24 DIAGNOSIS — N184 Chronic kidney disease, stage 4 (severe): Secondary | ICD-10-CM | POA: Diagnosis not present

## 2023-10-24 DIAGNOSIS — N189 Chronic kidney disease, unspecified: Secondary | ICD-10-CM | POA: Diagnosis not present

## 2023-10-24 DIAGNOSIS — E559 Vitamin D deficiency, unspecified: Secondary | ICD-10-CM | POA: Diagnosis not present

## 2023-11-02 ENCOUNTER — Encounter: Payer: Self-pay | Admitting: Family Medicine

## 2023-11-02 ENCOUNTER — Ambulatory Visit (INDEPENDENT_AMBULATORY_CARE_PROVIDER_SITE_OTHER): Admitting: Family Medicine

## 2023-11-02 VITALS — BP 108/70 | HR 83 | Temp 98.0°F | Ht 72.0 in | Wt 182.6 lb

## 2023-11-02 DIAGNOSIS — J4541 Moderate persistent asthma with (acute) exacerbation: Secondary | ICD-10-CM

## 2023-11-02 DIAGNOSIS — R051 Acute cough: Secondary | ICD-10-CM

## 2023-11-02 MED ORDER — PREDNISONE 20 MG PO TABS
40.0000 mg | ORAL_TABLET | Freq: Every day | ORAL | 0 refills | Status: AC
Start: 2023-11-02 — End: 2023-11-07

## 2023-11-02 MED ORDER — METHYLPREDNISOLONE ACETATE 40 MG/ML IJ SUSP
40.0000 mg | Freq: Once | INTRAMUSCULAR | Status: AC
  Administered 2023-11-02: 40 mg via INTRAMUSCULAR

## 2023-11-02 MED ORDER — METHYLPREDNISOLONE ACETATE 40 MG/ML IJ SUSP
40.0000 mg | Freq: Once | INTRAMUSCULAR | Status: AC
Start: 2023-11-02 — End: 2023-11-02
  Administered 2023-11-02: 40 mg via INTRAMUSCULAR

## 2023-11-02 NOTE — Patient Instructions (Signed)
 You have received a steroid injection in the office today.  I have sent in prednisone  for you to take 2 tablets once daily in the morning with breakfast for the next 5 days.  Follow-up with me for new or worsening symptoms.

## 2023-11-02 NOTE — Progress Notes (Signed)
 Acute Office Visit  Subjective:     Patient ID: Stephen Wilkins, male    DOB: April 27, 1927, 89 y.o.   MRN: [id number]  No chief complaint on file.   HPI  Discussed the use of AI scribe software for clinical note transcription with the patient, who gave verbal consent to proceed.  History of Present Illness Stephen Wilkins is a 89 year old male who presents with a semiannual episode of deep, heavy coughing and wheezing.  Cough and wheezing - Semiannual episodes of deep, heavy coughing and wheezing lasting approximately one week - Cough is particularly deep in the mornings and persists throughout the day - Cough is non-productive with no significant sputum production - Wheezing present during episodes  Response to prior treatments - Previous episodes have responded effectively to prednisone  - Typically does not require antibiotics for these episodes  Inhaler use - Uses inhalers at night, taking one puff - Does not require refills at this time      ROS Per HPI      Objective:    BP 108/70 (BP Location: Left Arm, Patient Position: Sitting)   Pulse 83   Temp 98 F (36.7 C) (Temporal)   Ht 6' (1.829 m)   Wt 182 lb 9.6 oz (82.8 kg)   SpO2 95%   BMI 24.77 kg/m    Physical Exam Vitals and nursing note reviewed.  Constitutional:      General: He is not in acute distress.    Comments: Appears fatigued, elderly   HENT:     Head: Normocephalic and atraumatic.     Right Ear: External ear normal.     Left Ear: External ear normal.     Nose: Nose normal.     Mouth/Throat:     Mouth: Mucous membranes are moist.     Pharynx: Oropharynx is clear.  Eyes:     Extraocular Movements: Extraocular movements intact.  Cardiovascular:     Rate and Rhythm: Normal rate and regular rhythm.     Pulses: Normal pulses.     Heart sounds: Murmur heard.  Pulmonary:     Effort: Pulmonary effort is normal. No respiratory distress.     Breath sounds: Normal breath sounds. No wheezing,  rhonchi or rales.     Comments: Moderate cough present  Musculoskeletal:     Cervical back: Normal range of motion.     Right lower leg: No edema.     Left lower leg: No edema.     Comments: Using 2 wheel walker with ambulation  Lymphadenopathy:     Cervical: No cervical adenopathy.  Skin:    General: Skin is warm and dry.  Neurological:     General: No focal deficit present.     Mental Status: He is alert and oriented to person, place, and time.  Psychiatric:        Mood and Affect: Mood normal.        Behavior: Behavior normal.     No results found for any visits on 11/02/23.      Assessment & Plan:   Assessment and Plan Assessment & Plan Chronic cough with wheezing Cough and wheezing occur semiannually, managed with steroids. No pneumonia or acute infection. Cough is deep, heavy, non-productive. Wheezing present, no antibiotics needed. - Administer steroid injection. - Prescribe prednisone . - Continue inhalers at night.     No orders of the defined types were placed in this encounter.    Meds ordered this encounter  Medications  methylPREDNISolone  acetate (DEPO-MEDROL ) injection 40 mg   methylPREDNISolone  acetate (DEPO-MEDROL ) injection 40 mg   predniSONE  (DELTASONE ) 20 MG tablet    Sig: Take 2 tablets (40 mg total) by mouth daily with breakfast for 5 days.    Dispense:  10 tablet    Refill:  0    Return if symptoms worsen or fail to improve.  Stephen LITTIE Ku, FNP

## 2023-11-17 DIAGNOSIS — N401 Enlarged prostate with lower urinary tract symptoms: Secondary | ICD-10-CM | POA: Diagnosis not present

## 2023-11-17 DIAGNOSIS — R3915 Urgency of urination: Secondary | ICD-10-CM | POA: Diagnosis not present

## 2023-11-17 DIAGNOSIS — R3914 Feeling of incomplete bladder emptying: Secondary | ICD-10-CM | POA: Diagnosis not present

## 2023-11-21 ENCOUNTER — Ambulatory Visit: Payer: Self-pay

## 2023-11-21 NOTE — Telephone Encounter (Signed)
 FYI Only or Action Required?: FYI only for provider.  Patient was last seen in primary care on 11/02/2023 by [name], FNP.  Called Nurse Triage reporting Fall.  Symptoms began Fall occurred today.  Symptoms are: unchanged.  Triage Disposition: See PCP When Office is Open (Within 3 Days)  Patient/caregiver understands and will follow disposition?: Yes       Copied from CRM #[id number]. Topic: Clinical - Red Word Triage >> Nov 21, 2023  4:45 PM [name] wrote: Red Word that prompted transfer to Nurse Triage: Patients wife is calling in stating that the patient has been having a lot of falls recently, very weak, and can barley move his feet.       Reason for Disposition  MILD weakness (e.g., does not interfere with ability to work, go to school, normal activities)  (Exception: Mild weakness is a chronic symptom.)  Answer Assessment - Initial Assessment Questions 1. MECHANISM: How did the fall happen?     Unsure  2. DOMESTIC VIOLENCE AND ELDER ABUSE SCREENING: Did you fall because someone pushed you or tried to hurt you? If Yes, ask: Are you safe now?     No 3. ONSET: When did the fall happen? (e.g., minutes, hours, or days ago)     Today  4. LOCATION: What part of the body hit the ground? (e.g., back, buttocks, head, hips, knees, hands, head, stomach)     One arm  5. INJURY: Did you hurt (injure) yourself when you fell? If Yes, ask: What did you injure? Tell me more about this? (e.g., body area; type of injury; pain severity)     Skinned an arm, wife is unsure which  6. PAIN: Is there any pain? If Yes, ask: How bad is the pain? (e.g., Scale 0-10; or none, mild,      No pain 9. OTHER SYMPTOMS: Do you have any other symptoms? (e.g., dizziness, fever, weakness; new-onset or worsening).      Cough 10. CAUSE: What do you think caused the fall (or falling)? (e.g., dizzy spell, tripped)       Feeling weak recently  Protocols used: Falls and  [facility]

## 2023-11-22 ENCOUNTER — Ambulatory Visit (INDEPENDENT_AMBULATORY_CARE_PROVIDER_SITE_OTHER): Admitting: Internal Medicine

## 2023-11-22 ENCOUNTER — Ambulatory Visit: Payer: Self-pay | Admitting: Internal Medicine

## 2023-11-22 ENCOUNTER — Ambulatory Visit (INDEPENDENT_AMBULATORY_CARE_PROVIDER_SITE_OTHER)

## 2023-11-22 ENCOUNTER — Encounter: Payer: Self-pay | Admitting: Internal Medicine

## 2023-11-22 VITALS — BP 84/54 | HR 87 | Temp 98.1°F | Ht 72.0 in | Wt 182.4 lb

## 2023-11-22 DIAGNOSIS — R058 Other specified cough: Secondary | ICD-10-CM

## 2023-11-22 DIAGNOSIS — R296 Repeated falls: Secondary | ICD-10-CM

## 2023-11-22 DIAGNOSIS — N184 Chronic kidney disease, stage 4 (severe): Secondary | ICD-10-CM

## 2023-11-22 DIAGNOSIS — E559 Vitamin D deficiency, unspecified: Secondary | ICD-10-CM

## 2023-11-22 LAB — URINALYSIS, ROUTINE W REFLEX MICROSCOPIC
Bilirubin Urine: NEGATIVE
Hgb urine dipstick: NEGATIVE
Ketones, ur: NEGATIVE
Leukocytes,Ua: NEGATIVE
Nitrite: NEGATIVE
Specific Gravity, Urine: <=1.005 — AB (ref 1.000–1.030)
Urine Glucose: NEGATIVE
Urobilinogen, UA: 0.2 (ref 0.0–1.0)
pH: 6 (ref 5.0–8.0)

## 2023-11-22 LAB — HEPATIC FUNCTION PANEL
ALT: 27 U/L (ref 0–53)
AST: 105 U/L — ABNORMAL HIGH (ref 0–37)
Albumin: 3.7 g/dL (ref 3.5–5.2)
Alkaline Phosphatase: 65 U/L (ref 39–117)
Bilirubin, Direct: 0.2 mg/dL (ref 0.0–0.3)
Total Bilirubin: 0.8 mg/dL (ref 0.2–1.2)
Total Protein: 6.2 g/dL (ref 6.0–8.3)

## 2023-11-22 LAB — CBC WITH DIFFERENTIAL/PLATELET
Basophils Absolute: 0 K/uL (ref 0.0–0.1)
Basophils Relative: 1 % (ref 0.0–3.0)
Eosinophils Absolute: 0.1 K/uL (ref 0.0–0.7)
Eosinophils Relative: 1.6 % (ref 0.0–5.0)
HCT: 33.3 % — ABNORMAL LOW (ref 39.0–52.0)
Hemoglobin: 11.1 g/dL — ABNORMAL LOW (ref 13.0–17.0)
Lymphocytes Relative: 19.7 % (ref 12.0–46.0)
Lymphs Abs: 1 K/uL (ref 0.7–4.0)
MCHC: 33.4 g/dL (ref 30.0–36.0)
MCV: 95.5 fl (ref 78.0–100.0)
Monocytes Absolute: 0.6 K/uL (ref 0.1–1.0)
Monocytes Relative: 12.7 % — ABNORMAL HIGH (ref 3.0–12.0)
Neutro Abs: 3.2 K/uL (ref 1.4–7.7)
Neutrophils Relative %: 65 % (ref 43.0–77.0)
Platelets: 159 K/uL (ref 150.0–400.0)
RBC: 3.49 Mil/uL — ABNORMAL LOW (ref 4.22–5.81)
RDW: 12.8 % (ref 11.5–15.5)
WBC: 4.9 K/uL (ref 4.0–10.5)

## 2023-11-22 LAB — BASIC METABOLIC PANEL WITH GFR
BUN: 43 mg/dL — ABNORMAL HIGH (ref 6–23)
CO2: 30 meq/L (ref 19–32)
Calcium: 8.7 mg/dL (ref 8.4–10.5)
Chloride: 99 meq/L (ref 96–112)
Creatinine, Ser: 2.41 mg/dL — ABNORMAL HIGH (ref 0.40–1.50)
GFR: 22.54 mL/min — ABNORMAL LOW (ref >60.00)
Glucose, Bld: 95 mg/dL (ref 70–99)
Potassium: 5 meq/L (ref 3.5–5.1)
Sodium: 137 meq/L (ref 135–145)

## 2023-11-22 LAB — TSH: TSH: 5.55 u[IU]/mL — ABNORMAL HIGH (ref 0.35–5.50)

## 2023-11-22 MED ORDER — LEVOFLOXACIN 500 MG PO TABS: 500.0000 mg | ORAL_TABLET | Freq: Every day | ORAL | 0 refills | Status: DC

## 2023-11-22 NOTE — Patient Instructions (Signed)
 Please take all new medication as prescribed  the antibiotic  Please continue all other medications as before, and refills have been done if requested.  Please have the pharmacy call with any other refills you may need.  Please keep your appointments with your specialists as you may have planned  Please go to the XRAY Department in the first floor for the x-ray testing  Please go to the LAB at the blood drawing area for the tests to be done  You will be contacted by phone if any changes need to be made immediately.  Otherwise, you will receive a letter about your results with an explanation, but please check with MyChart first.

## 2023-11-22 NOTE — Progress Notes (Signed)
 The test results show that your current treatment is OK, as the tests are stable.  Please continue the same plan.  There is no other need for change of treatment or further evaluation based on these results, at this time.  thanks

## 2023-11-22 NOTE — Assessment & Plan Note (Signed)
 Mild to mod, for antibx levaquin  500 every day, also for cxr,  to f/u any worsening symptoms or concerns

## 2023-11-22 NOTE — Assessment & Plan Note (Signed)
 Last vitamin D Lab Results  Component Value Date   VD25OH 24.87 (L) 06/01/2023   Low, to start oral replacement

## 2023-11-22 NOTE — Assessment & Plan Note (Addendum)
 C/w likely infection related weakness, denies injury, declines ED visit today, no seizure or syncope, now for labs including cbc  today, declines repeat PT

## 2023-11-22 NOTE — Progress Notes (Signed)
 Patient ID: Stephen Wilkins, male   DOB: 01-Dec-1927, 89 y.o.   MRN: [id number]        Chief Complaint: follow up prod cough x 1 wk, recurrent falls x 5 in last 4 days without injury or syncope; CKD4, copd, low vit d       HPI:  Stephen Wilkins is a 89 y.o. male here with son - wife sick at home, and now patient with 1 wk onset prod cough, generalized weakness, and recurrent witnessed falls x 5 in last 4 days, no injury or syncope.  Pt denies chest pain, increased sob or doe, wheezing, orthopnea, PND, increased LE swelling, palpitations, or syncope.  Son is very concerned that new finasteride  5 mg started at the same time as symptoms must mean it is related.   Pt denies polydipsia, polyuria, or new focal neuro s/s.  Delcines aricept  and not taking.  Dementia overall stable symptomatically, and not assoc with behavioral changes such as hallucinations, paranoia, or agitation.        Wt Readings from Last 3 Encounters:  11/22/23 182 lb 6.4 oz (82.7 kg)  11/02/23 182 lb 9.6 oz (82.8 kg)  10/02/23 181 lb (82.1 kg)   BP Readings from Last 3 Encounters:  11/22/23 (!) 84/54  11/02/23 108/70  09/20/23 120/72         Past Medical History:  Diagnosis Date   ABNORMAL ELECTROCARDIOGRAM 06/21/2007   ALLERGIC RHINITIS 03/23/2007   Anxiety state, unspecified 09/06/2013   ASTHMATIC BRONCHITIS, ACUTE 04/27/2007   BENIGN PROSTATIC HYPERTROPHY 03/23/2007   BRONCHITIS NOT SPECIFIED AS ACUTE OR CHRONIC 04/21/2007   BURSITIS, RIGHT HIP 07/31/2008   COPD (chronic obstructive pulmonary disease) (HCC) 04/19/2016   Cough 01/29/2009   Dementia (HCC) 09/01/2010   ERECTILE DYSFUNCTION 03/23/2007   ERECTILE DYSFUNCTION, ORGANIC 04/17/2009   Esophageal stricture    FREQUENCY, URINARY 04/26/2010   GERD 03/23/2007   HIATAL HERNIA    HYPERLIPIDEMIA 03/23/2007   HYPERTENSION 03/23/2007   MILD COGNITIVE IMPAIRMENT SO STATED 05/19/2010   NEPHROLITHIASIS, HX OF 03/23/2007   PSA, INCREASED 06/27/2008   RASH-NONVESICULAR 03/23/2007    REACTIVE AIRWAY DISEASE 01/14/2010   SCHATZKI'S RING    SCIATICA, RIGHT 04/28/2008   Unspecified hypothyroidism 09/06/2013   Wheezing 04/17/2009   Past Surgical History:  Procedure Laterality Date   COLONOSCOPY WITH PROPOFOL  N/A 11/12/2022   Procedure: COLONOSCOPY WITH PROPOFOL ;  Surgeon: Wilhelmenia Aloha Raddle., MD;  Location: [facility];  Service: Gastroenterology;  Laterality: N/A;   ERCP N/A 12/28/2018   Procedure: ENDOSCOPIC RETROGRADE CHOLANGIOPANCREATOGRAPHY (ERCP);  Surgeon: Aneita Gwendlyn DASEN, MD;  Location: [facility];  Service: Endoscopy;  Laterality: N/A;   HEMOSTASIS CLIP PLACEMENT  11/12/2022   Procedure: HEMOSTASIS CLIP PLACEMENT;  Surgeon: Wilhelmenia Aloha Raddle., MD;  Location: [facility];  Service: Gastroenterology;;   POLYPECTOMY  11/12/2022   Procedure: POLYPECTOMY;  Surgeon: Wilhelmenia Aloha Raddle., MD;  Location: [facility];  Service: Gastroenterology;;   REMOVAL OF STONES  12/28/2018   Procedure: REMOVAL OF STONES;  Surgeon: Aneita Gwendlyn DASEN, MD;  Location: [facility];  Service: Endoscopy;;  balloon sweep   SPHINCTEROTOMY  12/28/2018   Procedure: SPHINCTEROTOMY;  Surgeon: Aneita Gwendlyn DASEN, MD;  Location: [facility];  Service: Endoscopy;;   TONSILLECTOMY      reports that he has never smoked. He has never used smokeless tobacco. He reports that he does not drink alcohol and does not use drugs. family history includes Asthma in his sister; Cancer in an other family member; Emphysema  in his brother; Hypertension in an other family member; Lung cancer in his brother. Allergies  Allergen Reactions   Atorvastatin      REACTION: myalgia   Doxazosin Mesylate     REACTION: dizziness   Hydrocodone  Bit-Homatrop Mbr Other (See Comments)    anxiety   Hydrocodone  Anxiety   Valacyclovir Diarrhea   Current Outpatient Medications on File Prior to Visit  Medication Sig Dispense Refill   desoximetasone  (TOPICORT ) 0.25 % cream APPLY SPARINGLY TO THE AFFECTED AREA TWICE DAILY AS  NEEDED AS DIRECTED (Patient taking differently: Apply 1 application  topically 2 (two) times daily. Itching) 30 g 1   Fluticasone -Umeclidin-Vilant (TRELEGY ELLIPTA ) 100-62.5-25 MCG/ACT AEPB Inhale 1 puff into the lungs daily. 1 each 11   losartan  (COZAAR ) 100 MG tablet Take 1/2 (one-half) tablet by mouth once daily 45 tablet 0   Multiple Vitamin (MULTIVITAMINS PO) Take 1 tablet by mouth daily.     Multiple Vitamins-Minerals (CENTRUM SILVER PO) Take 1 tablet by mouth daily.      pantoprazole  (PROTONIX ) 40 MG tablet Take 1 tablet (40 mg total) by mouth daily. 90 tablet 3   polyethylene glycol powder (MIRALAX ) powder Take 1/2 capful by mouth once daily 850 g 5   rosuvastatin  (CRESTOR ) 10 MG tablet Take 1 tablet (10 mg total) by mouth daily. 90 tablet 3   solifenacin  (VESICARE ) 5 MG tablet Take 1 tablet (5 mg total) by mouth daily. 90 tablet 3   tamsulosin  (FLOMAX ) 0.4 MG CAPS capsule Take 1 capsule (0.4 mg total) by mouth in the morning and at bedtime. 180 capsule 3   vitamin B-12 (CYANOCOBALAMIN ) 100 MCG tablet Take 100 mcg by mouth once a week.     albuterol  (VENTOLIN  HFA) 108 (90 Base) MCG/ACT inhaler Inhale 2 puffs into the lungs every 6 (six) hours as needed for shortness of breath. (Patient not taking: Reported on 11/22/2023) 8 g 11   aspirin  EC 81 MG tablet Take by mouth. (Patient not taking: Reported on 11/22/2023)     clobetasol (TEMOVATE) 0.05 % external solution Apply 1ml TO SCALP twice a DAY FOR 1-2 WEEKS; THEN USE as needed. (Patient not taking: Reported on 11/22/2023)     No current facility-administered medications on file prior to visit.        ROS:  All others reviewed and negative.  Objective        PE:  BP (!) 84/54   Pulse 87   Temp 98.1 F (36.7 C) (Oral)   Ht 6' (1.829 m)   Wt 182 lb 6.4 oz (82.7 kg)   SpO2 92%   BMI 24.74 kg/m                 Constitutional: Pt appears mild ill, fatigued, non toxic               HENT: Head: NCAT.                Right Ear: External  ear normal.                 Left Ear: External ear normal.                Eyes: . Pupils are equal, round, and reactive to light. Conjunctivae and EOM are normal               Nose: without d/c or deformity               Neck: Neck supple. Gross normal  ROM               Cardiovascular: Normal rate and regular rhythm.                 Pulmonary/Chest: Effort normal and breath sounds decreased without rales or wheezing.                Abd:  Soft, NT, ND, + BS, no organomegaly               Neurological: Pt is alert. At baseline orientation, motor grossly intact               Skin: Skin is warm. No rashes, no other new lesions, LE edema - none               Psychiatric: Pt behavior is normal without agitation   Micro: none  Cardiac tracings I have personally interpreted today:  none  Pertinent Radiological findings (summarize): none   Lab Results  Component Value Date   WBC 6.1 09/20/2023   HGB 10.7 (L) 09/20/2023   HCT 32.0 (L) 09/20/2023   PLT 193.0 09/20/2023   GLUCOSE 111 (H) 09/20/2023   CHOL 125 09/20/2023   TRIG 103.0 09/20/2023   HDL 46.70 09/20/2023   LDLDIRECT 74.0 04/25/2022   LDLCALC 57 09/20/2023   ALT 8 09/20/2023   AST 16 09/20/2023   NA 140 09/20/2023   K 5.1 09/20/2023   CL 104 09/20/2023   CREATININE 2.40 (H) 09/20/2023   BUN 43 (H) 09/20/2023   CO2 33 (H) 09/20/2023   TSH 7.96 (H) 09/20/2023   PSA 3.94 03/12/2014   INR 1.1 11/12/2022   HGBA1C 6.4 09/20/2023   MICROALBUR 58.3 (H) 06/01/2023   Assessment/Plan:  Stephen Wilkins is a 34 y.o. White or Caucasian [1] male with  has a past medical history of ABNORMAL ELECTROCARDIOGRAM (06/21/2007), ALLERGIC RHINITIS (03/23/2007), Anxiety state, unspecified (09/06/2013), ASTHMATIC BRONCHITIS, ACUTE (04/27/2007), BENIGN PROSTATIC HYPERTROPHY (03/23/2007), BRONCHITIS NOT SPECIFIED AS ACUTE OR CHRONIC (04/21/2007), BURSITIS, RIGHT HIP (07/31/2008), COPD (chronic obstructive pulmonary disease) (HCC) (04/19/2016), Cough  (01/29/2009), Dementia (HCC) (09/01/2010), ERECTILE DYSFUNCTION (03/23/2007), ERECTILE DYSFUNCTION, ORGANIC (04/17/2009), Esophageal stricture, FREQUENCY, URINARY (04/26/2010), GERD (03/23/2007), HIATAL HERNIA, HYPERLIPIDEMIA (03/23/2007), HYPERTENSION (03/23/2007), MILD COGNITIVE IMPAIRMENT SO STATED (05/19/2010), NEPHROLITHIASIS, HX OF (03/23/2007), PSA, INCREASED (06/27/2008), RASH-NONVESICULAR (03/23/2007), REACTIVE AIRWAY DISEASE (01/14/2010), SCHATZKI'S RING, SCIATICA, RIGHT (04/28/2008), Unspecified hypothyroidism (09/06/2013), and Wheezing (04/17/2009).  Vitamin D  deficiency Last vitamin D  Lab Results  Component Value Date   VD25OH 24.87 (L) 06/01/2023   Low, to start oral replacement   COPD (chronic obstructive pulmonary disease) (HCC) O/w stable, no need for prednisone  at this time,  to f/u any worsening symptoms or concerns  Recurrent falls C/w likely infection related weakness, denies injury, declines ED visit today, no seizure or syncope, now for labs including cbc  today, declines repeat PT  Productive cough Mild to mod, for antibx levaquin  500 every day, also for cxr,  to f/u any worsening symptoms or concerns   CKD (chronic kidney disease) stage 4, GFR 15-29 ml/min (HCC) Lab Results  Component Value Date   CREATININE 2.40 (H) 09/20/2023   Stable overall, cont to avoid nephrotoxins, f/u renal as planned  Followup: Return in about 6 months (around 05/24/2024).  Lynwood Rush, MD 11/22/2023 2:40 PM [facility]

## 2023-11-22 NOTE — Assessment & Plan Note (Signed)
 Lab Results  Component Value Date   CREATININE 2.40 (H) 09/20/2023   Stable overall, cont to avoid nephrotoxins, f/u renal as planned

## 2023-11-22 NOTE — Assessment & Plan Note (Signed)
 O/w stable, no need for prednisone  at this time,  to f/u any worsening symptoms or concerns

## 2023-11-23 ENCOUNTER — Encounter: Payer: Self-pay | Admitting: Internal Medicine

## 2023-11-26 ENCOUNTER — Emergency Department (HOSPITAL_COMMUNITY)
Admission: EM | Admit: 2023-11-26 | Discharge: 2023-11-26 | Disposition: A | Discharge status: Home / Self Care | Attending: Emergency Medicine | Admitting: Emergency Medicine

## 2023-11-26 ENCOUNTER — Other Ambulatory Visit: Payer: Self-pay

## 2023-11-26 DIAGNOSIS — I129 Hypertensive chronic kidney disease with stage 1 through stage 4 chronic kidney disease, or unspecified chronic kidney disease: Secondary | ICD-10-CM | POA: Diagnosis not present

## 2023-11-26 DIAGNOSIS — Z7951 Long term (current) use of inhaled steroids: Secondary | ICD-10-CM | POA: Insufficient documentation

## 2023-11-26 DIAGNOSIS — N184 Chronic kidney disease, stage 4 (severe): Secondary | ICD-10-CM | POA: Insufficient documentation

## 2023-11-26 DIAGNOSIS — J4541 Moderate persistent asthma with (acute) exacerbation: Secondary | ICD-10-CM | POA: Diagnosis not present

## 2023-11-26 DIAGNOSIS — Z7982 Long term (current) use of aspirin: Secondary | ICD-10-CM | POA: Diagnosis not present

## 2023-11-26 DIAGNOSIS — J40 Bronchitis, not specified as acute or chronic: Secondary | ICD-10-CM | POA: Insufficient documentation

## 2023-11-26 DIAGNOSIS — E039 Hypothyroidism, unspecified: Secondary | ICD-10-CM | POA: Diagnosis not present

## 2023-11-26 DIAGNOSIS — F0280 Dementia in other diseases classified elsewhere without behavioral disturbance: Secondary | ICD-10-CM | POA: Diagnosis not present

## 2023-11-26 DIAGNOSIS — Z79899 Other long term (current) drug therapy: Secondary | ICD-10-CM | POA: Insufficient documentation

## 2023-11-26 DIAGNOSIS — R059 Cough, unspecified: Secondary | ICD-10-CM | POA: Diagnosis not present

## 2023-11-26 MED ORDER — PREDNISONE 20 MG PO TABS
40.0000 mg | ORAL_TABLET | Freq: Once | ORAL | Status: AC
  Administered 2023-11-26: 40 mg via ORAL
  Filled 2023-11-26: qty 2

## 2023-11-26 MED ORDER — IPRATROPIUM-ALBUTEROL 0.5-2.5 (3) MG/3ML IN SOLN
3.0000 mL | Freq: Once | RESPIRATORY_TRACT | Status: AC
  Administered 2023-11-26: 3 mL via RESPIRATORY_TRACT
  Filled 2023-11-26: qty 3

## 2023-11-26 MED ORDER — IPRATROPIUM-ALBUTEROL 0.5-2.5 (3) MG/3ML IN SOLN
3.0000 mL | RESPIRATORY_TRACT | Status: AC
  Administered 2023-11-26 (×2): 3 mL via RESPIRATORY_TRACT
  Filled 2023-11-26: qty 3

## 2023-11-26 MED ORDER — PREDNISONE 20 MG PO TABS: 40.0000 mg | ORAL_TABLET | Freq: Every day | ORAL | 0 refills | Status: AC

## 2023-11-26 NOTE — ED Provider Notes (Signed)
 [place] [facility] AT [facility] Provider Note  CSN: [id number] Arrival date & time: 11/26/23 9567  Chief Complaint(s) Cough  HPI Stephen Wilkins is a 89 y.o. male here for several weeks of productive cough.  Patient has seen his primary care provider and had an x-ray done without results.  Reports no improvement in cough despite steroid use.  Wife reports that the patient has not using his breathing treatments frequently.  No fevers.  No chest pain.  No shortness of breath.  The history is provided by the patient.    Past Medical History Past Medical History:  Diagnosis Date   ABNORMAL ELECTROCARDIOGRAM 06/21/2007   ALLERGIC RHINITIS 03/23/2007   Anxiety state, unspecified 09/06/2013   ASTHMATIC BRONCHITIS, ACUTE 04/27/2007   BENIGN PROSTATIC HYPERTROPHY 03/23/2007   BRONCHITIS NOT SPECIFIED AS ACUTE OR CHRONIC 04/21/2007   BURSITIS, RIGHT HIP 07/31/2008   COPD (chronic obstructive pulmonary disease) (HCC) 04/19/2016   Cough 01/29/2009   Dementia (HCC) 09/01/2010   ERECTILE DYSFUNCTION 03/23/2007   ERECTILE DYSFUNCTION, ORGANIC 04/17/2009   Esophageal stricture    FREQUENCY, URINARY 04/26/2010   GERD 03/23/2007   HIATAL HERNIA    HYPERLIPIDEMIA 03/23/2007   HYPERTENSION 03/23/2007   MILD COGNITIVE IMPAIRMENT SO STATED 05/19/2010   NEPHROLITHIASIS, HX OF 03/23/2007   PSA, INCREASED 06/27/2008   RASH-NONVESICULAR 03/23/2007   REACTIVE AIRWAY DISEASE 01/14/2010   SCHATZKI'S RING    SCIATICA, RIGHT 04/28/2008   Unspecified hypothyroidism 09/06/2013   Wheezing 04/17/2009   Patient Active Problem List   Diagnosis Date Noted   Productive cough 11/22/2023   Recurrent falls 11/22/2023   CKD (chronic kidney disease) stage 4, GFR 15-29 ml/min (HCC) 11/22/2023   Unqualified visual loss, both eyes 06/01/2023   PAD (peripheral artery disease) (HCC) 06/01/2023   Vertigo 11/29/2022   History of GI diverticular bleed 11/13/2022   History of colonic polyps 11/13/2022    Lower GI bleeding 11/12/2022   Lower GI bleed 11/11/2022   Hematochezia 11/11/2022   Unintentional weight loss 11/11/2022   Chronic constipation 11/11/2022   Acute on chronic anemia 11/11/2022   Acute GI bleeding 11/11/2022   OAB (overactive bladder) 11/09/2022   Lesion of ear 11/09/2022   Epithelial (juvenile) corneal dystrophy, 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   Shortness of breath 08/02/2022   Thoracic aortic aneurysm without rupture (HCC) 08/02/2022   Asthmatic bronchitis 08/02/2022   Acute hypoxemic respiratory failure (HCC) 06/28/2022   Severe persistent asthma with exacerbation 06/05/2022   Unspecified corneal edema 06/02/2022   Asthma 04/25/2022   Benign prostatic hyperplasia without urinary obstruction 04/25/2022   Hearing loss 04/25/2022   HTN (hypertension) 04/25/2022   Mixed hyperlipidemia 04/25/2022   Encounter for fitting and adjustment of hearing aid 04/25/2022   Encounter for well adult exam with abnormal findings 04/25/2022   Urinary frequency 10/24/2021   Heart murmur 10/24/2021   Low energy 10/21/2021   Vitamin D  deficiency 04/16/2021   Pain in joint of left shoulder 01/23/2020   Bilateral shoulder pain 11/22/2019   Myalgia 11/22/2019   Gait disorder 11/22/2019   Chronic pain of right thumb 05/17/2019   Primary osteoarthritis of first carpometacarpal joint of right hand 05/17/2019   Trigger finger of right thumb 05/17/2019   Corneal epithelial basement membrane dystrophy 03/04/2019   Keratoconjunctivitis sicca of both eyes not specified as Sjogren's 03/04/2019   Posterior vitreous detachment of both eyes 03/04/2019   Presbyopia of both eyes 03/04/2019   Pseudophakia of both eyes 03/04/2019   Febrile illness 12/27/2018  Choledocholithiasis 12/27/2018   Low back pain 07/11/2017   Non compliance w medication regimen 08/23/2016   Peripheral neuropathy 08/23/2016   COPD (chronic obstructive pulmonary disease) (HCC) 04/19/2016   Hyperglycemia 02/10/2016   Preventative  health care 07/29/2015   CAP (community acquired pneumonia) 05/30/2015   Abnormal CXR 05/30/2015   Cough 04/22/2015   Asthma with exacerbation 04/22/2015   Urine abnormality 04/22/2015   Abdominal pain 09/11/2014   Abnormal liver function test 09/11/2014   Dysphagia 07/29/2014   Anxiety state 09/06/2013   CKD (chronic kidney disease) 09/06/2013   Hypothyroidism 09/06/2013   Rash and nonspecific skin eruption 02/27/2013   Dizziness and giddiness 01/04/2013   Abnormal TSH 06/04/2012   Gross hematuria 01/09/2012   Bruising 06/05/2011   Dementia (HCC) 09/01/2010   Cough variant asthma vs UACS  01/14/2010   ERECTILE DYSFUNCTION, ORGANIC 04/17/2009   FATIGUE 06/27/2008   PSA, INCREASED 06/27/2008   SCIATICA, RIGHT 04/28/2008   SCHATZKI'S RING 07/18/2007   Diaphragmatic hernia 07/18/2007   Schatzki's ring 07/18/2007   ABNORMAL ELECTROCARDIOGRAM 06/21/2007   Hyperlipidemia 03/23/2007   Essential hypertension 03/23/2007   Allergic rhinitis 03/23/2007   GERD 03/23/2007   BPH associated with nocturia 03/23/2007   NEPHROLITHIASIS, HX OF 03/23/2007   Home Medication(s) Prior to Admission medications   Medication Sig Start Date End Date Taking? Authorizing Provider  predniSONE  (DELTASONE ) 20 MG tablet Take 2 tablets (40 mg total) by mouth daily with breakfast for 4 days. 11/26/23 11/30/23 Yes Shlomo Seres, Raynell Moder, MD  albuterol  (VENTOLIN  HFA) 108 [phone] Base) MCG/ACT inhaler Inhale 2 puffs into the lungs every 6 (six) hours as needed for shortness of breath. Patient not taking: Reported on 11/22/2023 06/01/23   Norleen Lynwood ORN, MD  aspirin  EC 81 MG tablet Take by mouth. Patient not taking: Reported on 11/22/2023 02/10/16   [provider]  clobetasol (TEMOVATE) 0.05 % external solution Apply 1ml TO SCALP twice a DAY FOR 1-2 WEEKS; THEN USE as needed. Patient not taking: Reported on 11/22/2023 05/26/23   [provider]  desoximetasone  (TOPICORT ) 0.25 % cream APPLY SPARINGLY TO  THE AFFECTED AREA TWICE DAILY AS NEEDED AS DIRECTED Patient taking differently: Apply 1 application  topically 2 (two) times daily. Itching 04/30/18   Norleen Lynwood ORN, MD  Fluticasone -Umeclidin-Vilant (TRELEGY ELLIPTA ) 100-62.5-25 MCG/ACT AEPB Inhale 1 puff into the lungs daily. 06/01/23   Norleen Lynwood ORN, MD  levofloxacin  (LEVAQUIN ) 500 MG tablet Take 1 tablet (500 mg total) by mouth daily. 11/22/23   Norleen Lynwood ORN, MD  losartan  (COZAAR ) 100 MG tablet Take 1/2 (one-half) tablet by mouth once daily 07/12/23   Norleen Lynwood ORN, MD  Multiple Vitamin (MULTIVITAMINS PO) Take 1 tablet by mouth daily. 03/02/22   [provider]  Multiple Vitamins-Minerals (CENTRUM SILVER PO) Take 1 tablet by mouth daily.     [provider]  pantoprazole  (PROTONIX ) 40 MG tablet Take 1 tablet (40 mg total) by mouth daily. 06/01/23   Norleen Lynwood ORN, MD  polyethylene glycol powder (MIRALAX ) powder Take 1/2 capful by mouth once daily 01/28/15   Norleen Lynwood ORN, MD  rosuvastatin  (CRESTOR ) 10 MG tablet Take 1 tablet (10 mg total) by mouth daily. 06/01/23   Norleen Lynwood ORN, MD  solifenacin  (VESICARE ) 5 MG tablet Take 1 tablet (5 mg total) by mouth daily. 06/01/23   Norleen Lynwood ORN, MD  tamsulosin  (FLOMAX ) 0.4 MG CAPS capsule Take 1 capsule (0.4 mg total) by mouth in the morning and at bedtime. 06/01/23  Norleen Lynwood ORN, MD  vitamin B-12 (CYANOCOBALAMIN ) 100 MCG tablet Take 100 mcg by mouth once a week.    [provider]                                                                                                                                    Allergies Atorvastatin , Doxazosin mesylate, Hydrocodone  bit-homatrop mbr, Hydrocodone , and Valacyclovir  Review of Systems Review of Systems As noted in HPI  Physical Exam Vital Signs  I have reviewed the triage vital signs BP (!) 150/90 (BP Location: Left Arm)   Pulse 77   Temp 97.9 F (36.6 C) (Oral)   Resp 20   Ht 6' (1.829 m)   Wt 83 kg   SpO2 99%   BMI 24.82  kg/m   Physical Exam Vitals reviewed.  Constitutional:      General: He is not in acute distress.    Appearance: He is well-developed. He is not diaphoretic.  HENT:     Head: Normocephalic and atraumatic.     Nose: Nose normal.  Eyes:     General: No scleral icterus.       Right eye: No discharge.        Left eye: No discharge.     Conjunctiva/sclera: Conjunctivae normal.     Pupils: Pupils are equal, round, and reactive to light.  Cardiovascular:     Rate and Rhythm: Normal rate and regular rhythm.     Heart sounds: No murmur heard.    No friction rub. No gallop.  Pulmonary:     Effort: Pulmonary effort is normal. No respiratory distress.     Breath sounds: No stridor. Examination of the right-middle field reveals wheezing. Examination of the left-middle field reveals wheezing. Examination of the right-lower field reveals wheezing and rhonchi. Examination of the left-lower field reveals wheezing and rhonchi. Wheezing and rhonchi present. No rales.  Abdominal:     General: There is no distension.     Palpations: Abdomen is soft.     Tenderness: There is no abdominal tenderness.  Musculoskeletal:        General: No tenderness.     Cervical back: Normal range of motion and neck supple.  Skin:    General: Skin is warm and dry.     Findings: No erythema or rash.  Neurological:     Mental Status: He is alert and oriented to person, place, and time.     ED Results and Treatments Labs (all labs ordered are listed, but only abnormal results are displayed) Labs Reviewed - No data to display  EKG  EKG Interpretation Date/Time:    Ventricular Rate:    PR Interval:    QRS Duration:    QT Interval:    QTC Calculation:   R Axis:      Text Interpretation:         Radiology No results found.  Medications Ordered in ED Medications  ipratropium-albuterol   (DUONEB) 0.5-2.5 (3) MG/3ML nebulizer solution 3 mL (3 mLs Nebulization Given 11/26/23 0607)  ipratropium-albuterol  (DUONEB) 0.5-2.5 (3) MG/3ML nebulizer solution 3 mL (3 mLs Nebulization Given 11/26/23 0733)  predniSONE  (DELTASONE ) tablet 40 mg (40 mg Oral Given 11/26/23 9266)   Procedures Procedures  (including critical care time) Medical Decision Making / ED Course   Medical Decision Making Risk Prescription drug management.    Cough Exam is notable for wheezing and rhonchi. I viewed patient's recent chest x-ray from 3 days ago showed hyperinflation.  No pneumothorax.  No pulm edema.  No pneumonia. Looks like patient was already Rx'd Levaquin  per clinic note.  Patient given a DuoNeb and oral steroids  Clinical Course as of 11/26/23 0806  Sun Nov 26, 2023  0700 Wheezing significantly improved.  Patient still has cough.  Will prescribe short course of steroids, provided with flutter valve. [PC]    Clinical Course User Index [PC] Aleksia Freiman, Raynell Moder, MD    Final Clinical Impression(s) / ED Diagnoses Final diagnoses:  Bronchitis  Moderate persistent asthma with exacerbation   The patient appears reasonably screened and/or stabilized for discharge and I doubt any other medical condition or other [facility] requiring further screening, evaluation, or treatment in the ED at this time. I have discussed the findings, Dx and Tx plan with the patient/family who expressed understanding and agree(s) with the plan. Discharge instructions discussed at length. The patient/family was given strict return precautions who verbalized understanding of the instructions. No further questions at time of discharge.  Disposition: Discharge  Condition: Good  ED Discharge Orders          Ordered    predniSONE  (DELTASONE ) 20 MG tablet  Daily with breakfast        11/26/23 0703             Follow Up: Norleen Lynwood ORN, MD [address] [place] [address] [phone]  Call  to schedule an  appointment for close follow up    This chart was dictated using voice recognition software.  Despite best efforts to proofread,  errors can occur which can change the documentation meaning.    Trine Raynell Moder, MD 11/26/23 [phone]

## 2023-11-26 NOTE — ED Triage Notes (Signed)
 Patient BIB EMS coming from home c/o of a cough that been going on for 2-3 week. States getting workup done this week with PCP but waiting on results. States getting predinsone prescribe. Denies fever/ shob. Or pain.

## 2023-11-26 NOTE — ED Notes (Signed)
 RN demonstrated to patient and patient wife on how to use Flutter Valve, return demonstration completed, MD at bedside.

## 2023-11-27 ENCOUNTER — Other Ambulatory Visit: Payer: Self-pay | Admitting: Internal Medicine

## 2023-11-28 ENCOUNTER — Encounter: Payer: Self-pay | Admitting: Family Medicine

## 2023-11-28 ENCOUNTER — Ambulatory Visit (INDEPENDENT_AMBULATORY_CARE_PROVIDER_SITE_OTHER): Admitting: Family Medicine

## 2023-11-28 VITALS — BP 110/70 | HR 63 | Temp 98.2°F | Ht 72.0 in | Wt 180.0 lb

## 2023-11-28 DIAGNOSIS — U071 COVID-19: Secondary | ICD-10-CM | POA: Diagnosis not present

## 2023-11-28 DIAGNOSIS — J411 Mucopurulent chronic bronchitis: Secondary | ICD-10-CM

## 2023-11-28 LAB — POC COVID19 BINAXNOW: SARS Coronavirus 2 Ag: POSITIVE — AB

## 2023-11-28 LAB — POCT INFLUENZA A/B
Influenza A, POC: NEGATIVE
Influenza B, POC: NEGATIVE

## 2023-11-28 MED ORDER — ALBUTEROL SULFATE (2.5 MG/3ML) 0.083% IN NEBU
2.5000 mg | INHALATION_SOLUTION | RESPIRATORY_TRACT | 0 refills | Status: AC | PRN
Start: 2023-11-28 — End: ?

## 2023-11-28 NOTE — Patient Instructions (Signed)
 Continue supportive care at home, make sure you are drinking plenty of fluids and getting some rest.  May take ibuprofen and Tylenol  as needed for fever, aches and pains.  Stay home and away from everyone until you are 24 hours fever free without fever reducing medications.  Follow-up with me if symptoms are persisting over the next week.  Follow-up in the emergency room if you are experiencing shortness of breath, high fever, unremitting cough, severe fatigue, other concerning symptoms.

## 2023-11-28 NOTE — Progress Notes (Signed)
 Acute Office Visit  Subjective:     Patient ID: Stephen Wilkins, male    DOB: 30-Sep-1927, 89 y.o.   MRN: [id number]  No chief complaint on file.   HPI  89 year old male presents for evaluation of Bronchitis after being seen in the ER at [facility]. ER follow up from 11/20/23, diagnosed with bronchitis. Has been treated with steroids, levaquin , and duoneb in the ER and then albuterol  nebs at home. Would like to be tested for COVID. There was no COVID test done at the hospital. X-ray from 11/22/2023 does not show acute cardiopulmonary process. He is currently taking steroids and finishing Levaquin  that he was recently prescribed. Reports that he is getting better. He has had this cough for greater than 2 weeks.    ROS Per HPI      Objective:    BP 110/70 (BP Location: Left Arm, Patient Position: Sitting)   Pulse 63   Temp 98.2 F (36.8 C) (Temporal)   Ht 6' (1.829 m)   Wt 180 lb (81.6 kg)   SpO2 93%   BMI 24.41 kg/m    Physical Exam Vitals and nursing note reviewed.  Constitutional:      General: He is not in acute distress.    Comments: Appears fatigued  HENT:     Head: Normocephalic and atraumatic.     Right Ear: External ear normal.     Left Ear: External ear normal.     Nose: Nose normal.     Mouth/Throat:     Mouth: Mucous membranes are moist.     Pharynx: Oropharynx is clear.  Eyes:     Extraocular Movements: Extraocular movements intact.  Cardiovascular:     Rate and Rhythm: Normal rate and regular rhythm.     Pulses: Normal pulses.     Heart sounds: Normal heart sounds.  Pulmonary:     Effort: Pulmonary effort is normal. No respiratory distress.     Breath sounds: Normal breath sounds. No wheezing, rhonchi or rales.  Musculoskeletal:     Cervical back: Normal range of motion.     Right lower leg: No edema.     Left lower leg: No edema.     Comments: In wheelchair for visit  Lymphadenopathy:     Cervical: Cervical adenopathy present.  Skin:     General: Skin is warm and dry.  Neurological:     General: No focal deficit present.     Mental Status: He is alert and oriented to person, place, and time.  Psychiatric:        Mood and Affect: Mood normal.        Behavior: Behavior normal.     Results for orders placed or performed in visit on 11/28/23  POC COVID-19 BinaxNow  Result Value Ref Range   SARS Coronavirus 2 Ag Positive (A) Negative  POCT Influenza A/B  Result Value Ref Range   Influenza A, POC Negative Negative   Influenza B, POC Negative Negative        Assessment & Plan:   Mucopurulent chronic bronchitis (HCC) -     POC COVID-19 BinaxNow -     POCT Influenza A/B -     Albuterol  Sulfate; Take 3 mLs (2.5 mg total) by nebulization every 4 (four) hours as needed for wheezing or shortness of breath.  Dispense: 150 mL; Refill: 0  COVID-19 -     Albuterol  Sulfate; Take 3 mLs (2.5 mg total) by nebulization every 4 (four) hours as needed  for wheezing or shortness of breath.  Dispense: 150 mL; Refill: 0   He is outside the window for treatment with Paxlovid for COVID.  Continue Levaquin  Refilled albuterol  for nebulizer Continue steroids   Orders Placed This Encounter  Procedures   POC COVID-19 BinaxNow   POCT Influenza A/B     Meds ordered this encounter  Medications   albuterol  (PROVENTIL ) (2.5 MG/3ML) 0.083% nebulizer solution    Sig: Take 3 mLs (2.5 mg total) by nebulization every 4 (four) hours as needed for wheezing or shortness of breath.    Dispense:  150 mL    Refill:  0    Return if symptoms worsen or fail to improve.  Corean LITTIE Ku, FNP

## 2023-12-01 ENCOUNTER — Emergency Department (HOSPITAL_COMMUNITY)

## 2023-12-01 ENCOUNTER — Emergency Department (HOSPITAL_COMMUNITY)
Admission: EM | Admit: 2023-12-01 | Discharge: 2023-12-01 | Disposition: A | Discharge status: Home / Self Care | Source: Home / Self Care | Attending: Emergency Medicine | Admitting: Emergency Medicine

## 2023-12-01 DIAGNOSIS — Y92009 Unspecified place in unspecified non-institutional (private) residence as the place of occurrence of the external cause: Secondary | ICD-10-CM | POA: Insufficient documentation

## 2023-12-01 DIAGNOSIS — S0990XA Unspecified injury of head, initial encounter: Secondary | ICD-10-CM | POA: Diagnosis not present

## 2023-12-01 DIAGNOSIS — I63511 Cerebral infarction due to unspecified occlusion or stenosis of right middle cerebral artery: Secondary | ICD-10-CM | POA: Diagnosis not present

## 2023-12-01 DIAGNOSIS — S199XXA Unspecified injury of neck, initial encounter: Secondary | ICD-10-CM | POA: Diagnosis not present

## 2023-12-01 DIAGNOSIS — Z7982 Long term (current) use of aspirin: Secondary | ICD-10-CM | POA: Insufficient documentation

## 2023-12-01 DIAGNOSIS — I6782 Cerebral ischemia: Secondary | ICD-10-CM | POA: Diagnosis not present

## 2023-12-01 DIAGNOSIS — R58 Hemorrhage, not elsewhere classified: Secondary | ICD-10-CM | POA: Diagnosis not present

## 2023-12-01 DIAGNOSIS — W19XXXA Unspecified fall, initial encounter: Secondary | ICD-10-CM

## 2023-12-01 DIAGNOSIS — W0110XA Fall on same level from slipping, tripping and stumbling with subsequent striking against unspecified object, initial encounter: Secondary | ICD-10-CM | POA: Insufficient documentation

## 2023-12-01 DIAGNOSIS — E860 Dehydration: Secondary | ICD-10-CM | POA: Diagnosis not present

## 2023-12-01 DIAGNOSIS — M47811 Spondylosis without myelopathy or radiculopathy, occipito-atlanto-axial region: Secondary | ICD-10-CM | POA: Diagnosis not present

## 2023-12-01 DIAGNOSIS — S0101XA Laceration without foreign body of scalp, initial encounter: Secondary | ICD-10-CM | POA: Diagnosis not present

## 2023-12-01 DIAGNOSIS — S0181XA Laceration without foreign body of other part of head, initial encounter: Secondary | ICD-10-CM | POA: Diagnosis not present

## 2023-12-01 DIAGNOSIS — M5030 Other cervical disc degeneration, unspecified cervical region: Secondary | ICD-10-CM | POA: Diagnosis not present

## 2023-12-01 DIAGNOSIS — M47812 Spondylosis without myelopathy or radiculopathy, cervical region: Secondary | ICD-10-CM | POA: Diagnosis not present

## 2023-12-01 NOTE — ED Triage Notes (Addendum)
 Fell at home with head strike. Neg thinners. 5/6th time falling within the last week. Recent bronchitis diagnosis. Unsure of LOC.  A/o x3. Unsure of time of year. CBG 130. Small lac to forehead.

## 2023-12-01 NOTE — ED Provider Notes (Signed)
 [place] EMERGENCY DEPARTMENT AT [facility] Provider Note   CSN: [id number] Arrival date & time: 12/01/23  1637     Patient presents with: Stephen Wilkins is a 89 y.o. male.   89 year old male with prior medical history as detailed below presents for evaluation.  Patient with apparent fall at home.  He did strike his head.  He is not on thinners.  Patient reports no injury other than a laceration/abrasion to his left forehead.     The history is provided by the patient and medical records.       Prior to Admission medications   Medication Sig Start Date End Date Taking? Authorizing Provider  albuterol  (PROVENTIL ) (2.5 MG/3ML) 0.083% nebulizer solution Take 3 mLs (2.5 mg total) by nebulization every 4 (four) hours as needed for wheezing or shortness of breath. 11/28/23   Alvia Corean LITTIE, FNP  aspirin  EC 81 MG tablet Take by mouth. Patient not taking: Reported on 11/22/2023 02/10/16   [provider]  clobetasol (TEMOVATE) 0.05 % external solution Apply 1ml TO SCALP twice a DAY FOR 1-2 WEEKS; THEN USE as needed. Patient not taking: Reported on 11/22/2023 05/26/23   [provider]  desoximetasone  (TOPICORT ) 0.25 % cream APPLY SPARINGLY TO THE AFFECTED AREA TWICE DAILY AS NEEDED AS DIRECTED Patient taking differently: Apply 1 application  topically 2 (two) times daily. Itching 04/30/18   Norleen Lynwood ORN, MD  Fluticasone -Umeclidin-Vilant (TRELEGY ELLIPTA ) 100-62.5-25 MCG/ACT AEPB Inhale 1 puff into the lungs daily. 06/01/23   Norleen Lynwood ORN, MD  levofloxacin  (LEVAQUIN ) 500 MG tablet Take 1 tablet (500 mg total) by mouth daily. 11/22/23   Norleen Lynwood ORN, MD  losartan  (COZAAR ) 100 MG tablet Take 1/2 (one-half) tablet by mouth once daily 07/12/23   Norleen Lynwood ORN, MD  Multiple Vitamin (MULTIVITAMINS PO) Take 1 tablet by mouth daily. 03/02/22   [provider]  Multiple Vitamins-Minerals (CENTRUM SILVER PO) Take 1 tablet by mouth daily.     [provider]  pantoprazole  (PROTONIX ) 40 MG tablet Take 1 tablet (40 mg total) by mouth daily. 06/01/23   Norleen Lynwood ORN, MD  polyethylene glycol powder (MIRALAX ) powder Take 1/2 capful by mouth once daily 01/28/15   Norleen Lynwood ORN, MD  rosuvastatin  (CRESTOR ) 10 MG tablet Take 1 tablet (10 mg total) by mouth daily. 06/01/23   Norleen Lynwood ORN, MD  solifenacin  (VESICARE ) 5 MG tablet Take 1 tablet (5 mg total) by mouth daily. 06/01/23   Norleen Lynwood ORN, MD  tamsulosin  (FLOMAX ) 0.4 MG CAPS capsule Take 1 capsule (0.4 mg total) by mouth in the morning and at bedtime. 06/01/23   Norleen Lynwood ORN, MD  vitamin B-12 (CYANOCOBALAMIN ) 100 MCG tablet Take 100 mcg by mouth once a week.    [provider]    Allergies: Atorvastatin , Doxazosin mesylate, Hydrocodone  bit-homatrop mbr, Hydrocodone , and Valacyclovir    Review of Systems  All other systems reviewed and are negative.   Updated Vital Signs BP (!) 147/75   Pulse 78   Temp 98.7 F (37.1 C) (Oral)   Resp 16   SpO2 99%   Physical Exam Vitals and nursing note reviewed.  Constitutional:      General: He is not in acute distress.    Appearance: Normal appearance. He is well-developed.  HENT:     Head: Normocephalic.     Comments: Small abrasion to left forehead. Eyes:     Conjunctiva/sclera: Conjunctivae normal.  Pupils: Pupils are equal, round, and reactive to light.  Cardiovascular:     Rate and Rhythm: Normal rate and regular rhythm.     Heart sounds: Normal heart sounds.  Pulmonary:     Effort: Pulmonary effort is normal. No respiratory distress.     Breath sounds: Normal breath sounds.  Abdominal:     General: There is no distension.     Palpations: Abdomen is soft.     Tenderness: There is no abdominal tenderness.  Musculoskeletal:        General: No deformity. Normal range of motion.     Cervical back: Normal range of motion and neck supple.  Skin:    General: Skin is warm and dry.  Neurological:     General: No focal  deficit present.     Mental Status: He is alert and oriented to person, place, and time.     (all labs ordered are listed, but only abnormal results are displayed) Labs Reviewed - No data to display  EKG: None  Radiology: No results found.   .Laceration Repair  Date/Time: 12/01/2023 10:40 PM  Performed by: Laurice Maude BROCKS, MD Authorized by: Laurice Maude BROCKS, MD   Consent:    Consent obtained:  Verbal   Consent given by:  Patient   Risks, benefits, and alternatives were discussed: yes     Risks discussed:  Infection, need for additional repair, nerve damage, poor wound healing, pain, poor cosmetic result, retained foreign body, tendon damage and vascular damage   Alternatives discussed:  No treatment Universal protocol:    Immediately prior to procedure, a time out was called: yes     Patient identity confirmed:  Verbally with patient Anesthesia:    Anesthesia method:  None Laceration details:    Location:  Scalp   Scalp location:  Frontal   Length (cm):  0.5 Pre-procedure details:    Preparation:  Imaging obtained to evaluate for foreign bodies and patient was prepped and draped in usual sterile fashion Exploration:    Limited defect created (wound extended): no     Hemostasis achieved with:  Direct pressure   Imaging outcome: foreign body not noted     Wound exploration: wound explored through full range of motion     Contaminated: no   Treatment:    Area cleansed with:  Saline   Amount of cleaning:  Standard Skin repair:    Repair method:  Tissue adhesive Approximation:    Approximation:  Close Repair type:    Repair type:  Simple Post-procedure details:    Dressing:  Open (no dressing)   Procedure completion:  Tolerated    Medications Ordered in the ED - No data to display                                  Medical Decision Making Patient presents from home after fall where he struck his head.  His mental status is at baseline.  He has no evidence of  significant injury on exam.  Tetanus is up-to-date.  CT imaging obtained is without acute abnormality.  Patient feels improved after evaluation.  He now desires discharge.  Patient spouse was called at listed contact number.  No answer.  Message left.  Patient is appropriate for discharge.  Importance of close follow-up is stressed.  Strict return precautions given understood.  Amount and/or Complexity of Data Reviewed Radiology: ordered.  Final diagnoses:  Fall, initial encounter  Injury of head, initial encounter  Laceration of scalp, initial encounter    ED Discharge Orders     None          Laurice Maude BROCKS, MD 12/01/23 2241

## 2023-12-01 NOTE — Discharge Instructions (Signed)
 Return for any problem.  ?

## 2023-12-04 ENCOUNTER — Emergency Department (HOSPITAL_COMMUNITY)

## 2023-12-04 ENCOUNTER — Encounter (HOSPITAL_COMMUNITY): Payer: Self-pay

## 2023-12-04 ENCOUNTER — Inpatient Hospital Stay (HOSPITAL_COMMUNITY)
Admission: EM | Admit: 2023-12-04 | Discharge: 2023-12-08 | DRG: 065 | Disposition: A | Discharge status: Skilled Nursing Facility | Attending: Family Medicine | Admitting: Family Medicine

## 2023-12-04 ENCOUNTER — Other Ambulatory Visit: Payer: Self-pay

## 2023-12-04 ENCOUNTER — Inpatient Hospital Stay (HOSPITAL_COMMUNITY)

## 2023-12-04 DIAGNOSIS — Z87891 Personal history of nicotine dependence: Secondary | ICD-10-CM | POA: Diagnosis not present

## 2023-12-04 DIAGNOSIS — Z8616 Personal history of COVID-19: Secondary | ICD-10-CM | POA: Diagnosis not present

## 2023-12-04 DIAGNOSIS — I739 Peripheral vascular disease, unspecified: Secondary | ICD-10-CM | POA: Diagnosis not present

## 2023-12-04 DIAGNOSIS — Z66 Do not resuscitate: Secondary | ICD-10-CM | POA: Diagnosis not present

## 2023-12-04 DIAGNOSIS — E039 Hypothyroidism, unspecified: Secondary | ICD-10-CM | POA: Diagnosis not present

## 2023-12-04 DIAGNOSIS — I6340 Cerebral infarction due to embolism of unspecified cerebral artery: Secondary | ICD-10-CM | POA: Diagnosis not present

## 2023-12-04 DIAGNOSIS — Y92009 Unspecified place in unspecified non-institutional (private) residence as the place of occurrence of the external cause: Secondary | ICD-10-CM | POA: Diagnosis not present

## 2023-12-04 DIAGNOSIS — Z8249 Family history of ischemic heart disease and other diseases of the circulatory system: Secondary | ICD-10-CM | POA: Diagnosis not present

## 2023-12-04 DIAGNOSIS — H547 Unspecified visual loss: Secondary | ICD-10-CM | POA: Diagnosis not present

## 2023-12-04 DIAGNOSIS — I63511 Cerebral infarction due to unspecified occlusion or stenosis of right middle cerebral artery: Secondary | ICD-10-CM | POA: Diagnosis not present

## 2023-12-04 DIAGNOSIS — I63522 Cerebral infarction due to unspecified occlusion or stenosis of left anterior cerebral artery: Secondary | ICD-10-CM | POA: Diagnosis present

## 2023-12-04 DIAGNOSIS — R296 Repeated falls: Secondary | ICD-10-CM | POA: Diagnosis not present

## 2023-12-04 DIAGNOSIS — N401 Enlarged prostate with lower urinary tract symptoms: Secondary | ICD-10-CM | POA: Diagnosis present

## 2023-12-04 DIAGNOSIS — R5383 Other fatigue: Secondary | ICD-10-CM

## 2023-12-04 DIAGNOSIS — R2681 Unsteadiness on feet: Secondary | ICD-10-CM | POA: Diagnosis not present

## 2023-12-04 DIAGNOSIS — J45909 Unspecified asthma, uncomplicated: Secondary | ICD-10-CM | POA: Diagnosis not present

## 2023-12-04 DIAGNOSIS — W19XXXA Unspecified fall, initial encounter: Secondary | ICD-10-CM | POA: Diagnosis not present

## 2023-12-04 DIAGNOSIS — N179 Acute kidney failure, unspecified: Secondary | ICD-10-CM | POA: Diagnosis present

## 2023-12-04 DIAGNOSIS — R29810 Facial weakness: Secondary | ICD-10-CM | POA: Diagnosis present

## 2023-12-04 DIAGNOSIS — R7303 Prediabetes: Secondary | ICD-10-CM | POA: Diagnosis present

## 2023-12-04 DIAGNOSIS — R339 Retention of urine, unspecified: Secondary | ICD-10-CM | POA: Diagnosis not present

## 2023-12-04 DIAGNOSIS — F0394 Unspecified dementia, unspecified severity, with anxiety: Secondary | ICD-10-CM | POA: Diagnosis not present

## 2023-12-04 DIAGNOSIS — Z825 Family history of asthma and other chronic lower respiratory diseases: Secondary | ICD-10-CM

## 2023-12-04 DIAGNOSIS — I6523 Occlusion and stenosis of bilateral carotid arteries: Secondary | ICD-10-CM | POA: Diagnosis not present

## 2023-12-04 DIAGNOSIS — E860 Dehydration: Secondary | ICD-10-CM | POA: Diagnosis present

## 2023-12-04 DIAGNOSIS — R638 Other symptoms and signs concerning food and fluid intake: Secondary | ICD-10-CM | POA: Diagnosis not present

## 2023-12-04 DIAGNOSIS — M6281 Muscle weakness (generalized): Secondary | ICD-10-CM | POA: Diagnosis not present

## 2023-12-04 DIAGNOSIS — I129 Hypertensive chronic kidney disease with stage 1 through stage 4 chronic kidney disease, or unspecified chronic kidney disease: Secondary | ICD-10-CM | POA: Diagnosis not present

## 2023-12-04 DIAGNOSIS — I63532 Cerebral infarction due to unspecified occlusion or stenosis of left posterior cerebral artery: Secondary | ICD-10-CM | POA: Diagnosis not present

## 2023-12-04 DIAGNOSIS — J449 Chronic obstructive pulmonary disease, unspecified: Secondary | ICD-10-CM | POA: Diagnosis not present

## 2023-12-04 DIAGNOSIS — S0101XA Laceration without foreign body of scalp, initial encounter: Secondary | ICD-10-CM | POA: Diagnosis not present

## 2023-12-04 DIAGNOSIS — Z79899 Other long term (current) drug therapy: Secondary | ICD-10-CM

## 2023-12-04 DIAGNOSIS — R42 Dizziness and giddiness: Secondary | ICD-10-CM | POA: Diagnosis not present

## 2023-12-04 DIAGNOSIS — R29704 NIHSS score 4: Secondary | ICD-10-CM | POA: Diagnosis not present

## 2023-12-04 DIAGNOSIS — R058 Other specified cough: Secondary | ICD-10-CM | POA: Diagnosis not present

## 2023-12-04 DIAGNOSIS — R29705 NIHSS score 5: Secondary | ICD-10-CM | POA: Diagnosis present

## 2023-12-04 DIAGNOSIS — E785 Hyperlipidemia, unspecified: Secondary | ICD-10-CM | POA: Diagnosis not present

## 2023-12-04 DIAGNOSIS — I6502 Occlusion and stenosis of left vertebral artery: Secondary | ICD-10-CM | POA: Diagnosis not present

## 2023-12-04 DIAGNOSIS — I6521 Occlusion and stenosis of right carotid artery: Secondary | ICD-10-CM | POA: Diagnosis not present

## 2023-12-04 DIAGNOSIS — Z9181 History of falling: Secondary | ICD-10-CM

## 2023-12-04 DIAGNOSIS — R41841 Cognitive communication deficit: Secondary | ICD-10-CM | POA: Diagnosis not present

## 2023-12-04 DIAGNOSIS — Z888 Allergy status to other drugs, medicaments and biological substances status: Secondary | ICD-10-CM

## 2023-12-04 DIAGNOSIS — I712 Thoracic aortic aneurysm, without rupture, unspecified: Secondary | ICD-10-CM | POA: Diagnosis present

## 2023-12-04 DIAGNOSIS — I951 Orthostatic hypotension: Secondary | ICD-10-CM | POA: Diagnosis not present

## 2023-12-04 DIAGNOSIS — K219 Gastro-esophageal reflux disease without esophagitis: Secondary | ICD-10-CM | POA: Diagnosis present

## 2023-12-04 DIAGNOSIS — I639 Cerebral infarction, unspecified: Secondary | ICD-10-CM | POA: Diagnosis present

## 2023-12-04 DIAGNOSIS — Z7982 Long term (current) use of aspirin: Secondary | ICD-10-CM

## 2023-12-04 DIAGNOSIS — Z7902 Long term (current) use of antithrombotics/antiplatelets: Secondary | ICD-10-CM

## 2023-12-04 DIAGNOSIS — U071 COVID-19: Secondary | ICD-10-CM

## 2023-12-04 DIAGNOSIS — F411 Generalized anxiety disorder: Secondary | ICD-10-CM | POA: Diagnosis present

## 2023-12-04 DIAGNOSIS — R338 Other retention of urine: Secondary | ICD-10-CM | POA: Diagnosis not present

## 2023-12-04 DIAGNOSIS — I959 Hypotension, unspecified: Secondary | ICD-10-CM | POA: Diagnosis not present

## 2023-12-04 DIAGNOSIS — M4316 Spondylolisthesis, lumbar region: Secondary | ICD-10-CM | POA: Diagnosis not present

## 2023-12-04 DIAGNOSIS — R410 Disorientation, unspecified: Secondary | ICD-10-CM

## 2023-12-04 DIAGNOSIS — K222 Esophageal obstruction: Secondary | ICD-10-CM | POA: Diagnosis not present

## 2023-12-04 DIAGNOSIS — I10 Essential (primary) hypertension: Secondary | ICD-10-CM | POA: Diagnosis present

## 2023-12-04 DIAGNOSIS — R29898 Other symptoms and signs involving the musculoskeletal system: Secondary | ICD-10-CM | POA: Diagnosis not present

## 2023-12-04 DIAGNOSIS — I700 Atherosclerosis of aorta: Secondary | ICD-10-CM | POA: Diagnosis not present

## 2023-12-04 DIAGNOSIS — G8314 Monoplegia of lower limb affecting left nondominant side: Secondary | ICD-10-CM | POA: Diagnosis not present

## 2023-12-04 DIAGNOSIS — Z743 Need for continuous supervision: Secondary | ICD-10-CM | POA: Diagnosis not present

## 2023-12-04 DIAGNOSIS — F0390 Unspecified dementia without behavioral disturbance: Secondary | ICD-10-CM | POA: Diagnosis not present

## 2023-12-04 DIAGNOSIS — N529 Male erectile dysfunction, unspecified: Secondary | ICD-10-CM | POA: Diagnosis present

## 2023-12-04 DIAGNOSIS — S0990XA Unspecified injury of head, initial encounter: Secondary | ICD-10-CM | POA: Diagnosis not present

## 2023-12-04 DIAGNOSIS — I6389 Other cerebral infarction: Secondary | ICD-10-CM | POA: Diagnosis not present

## 2023-12-04 DIAGNOSIS — N1832 Chronic kidney disease, stage 3b: Secondary | ICD-10-CM | POA: Diagnosis present

## 2023-12-04 DIAGNOSIS — R531 Weakness: Secondary | ICD-10-CM | POA: Diagnosis not present

## 2023-12-04 DIAGNOSIS — Z885 Allergy status to narcotic agent status: Secondary | ICD-10-CM

## 2023-12-04 DIAGNOSIS — M5126 Other intervertebral disc displacement, lumbar region: Secondary | ICD-10-CM | POA: Diagnosis not present

## 2023-12-04 DIAGNOSIS — G629 Polyneuropathy, unspecified: Secondary | ICD-10-CM | POA: Diagnosis not present

## 2023-12-04 HISTORY — DX: Chronic kidney disease, unspecified: N18.9

## 2023-12-04 HISTORY — DX: Repeated falls: R29.6

## 2023-12-04 HISTORY — DX: Disorientation, unspecified: R41.0

## 2023-12-04 HISTORY — DX: Vascular dementia, unspecified severity, without behavioral disturbance, psychotic disturbance, mood disturbance, and anxiety: F01.50

## 2023-12-04 LAB — URINALYSIS, W/ REFLEX TO CULTURE (INFECTION SUSPECTED)
Bacteria, UA: NONE SEEN
Bilirubin Urine: NEGATIVE
Glucose, UA: NEGATIVE mg/dL
Hgb urine dipstick: NEGATIVE
Ketones, ur: NEGATIVE mg/dL
Leukocytes,Ua: NEGATIVE
Nitrite: NEGATIVE
Protein, ur: NEGATIVE mg/dL
Specific Gravity, Urine: 1.012 (ref 1.005–1.030)
pH: 5 (ref 5.0–8.0)

## 2023-12-04 LAB — CBC WITH DIFFERENTIAL/PLATELET
Abs Immature Granulocytes: 0.1 K/uL — ABNORMAL HIGH (ref 0.00–0.07)
Basophils Absolute: 0 K/uL (ref 0.0–0.1)
Basophils Relative: 0 %
Eosinophils Absolute: 0.2 K/uL (ref 0.0–0.5)
Eosinophils Relative: 2 %
HCT: 33.8 % — ABNORMAL LOW (ref 39.0–52.0)
Hemoglobin: 10.8 g/dL — ABNORMAL LOW (ref 13.0–17.0)
Immature Granulocytes: 1 %
Lymphocytes Relative: 16 %
Lymphs Abs: 1.8 K/uL (ref 0.7–4.0)
MCH: 31.8 pg (ref 26.0–34.0)
MCHC: 32 g/dL (ref 30.0–36.0)
MCV: 99.4 fL (ref 80.0–100.0)
Monocytes Absolute: 1 K/uL (ref 0.1–1.0)
Monocytes Relative: 9 %
Neutro Abs: 8.2 K/uL — ABNORMAL HIGH (ref 1.7–7.7)
Neutrophils Relative %: 72 %
Platelets: 207 K/uL (ref 150–400)
RBC: 3.4 MIL/uL — ABNORMAL LOW (ref 4.22–5.81)
RDW: 12.4 % (ref 11.5–15.5)
WBC: 11.3 K/uL — ABNORMAL HIGH (ref 4.0–10.5)
nRBC: 0 % (ref 0.0–0.2)

## 2023-12-04 LAB — COMPREHENSIVE METABOLIC PANEL WITH GFR
ALT: 13 U/L (ref 0–44)
AST: 18 U/L (ref 15–41)
Albumin: 2.9 g/dL — ABNORMAL LOW (ref 3.5–5.0)
Alkaline Phosphatase: 50 U/L (ref 38–126)
Anion gap: 10 (ref 5–15)
BUN: 58 mg/dL — ABNORMAL HIGH (ref 8–23)
CO2: 25 mmol/L (ref 22–32)
Calcium: 8.7 mg/dL — ABNORMAL LOW (ref 8.9–10.3)
Chloride: 102 mmol/L (ref 98–111)
Creatinine, Ser: 3.02 mg/dL — ABNORMAL HIGH (ref 0.61–1.24)
GFR, Estimated: 19 mL/min — ABNORMAL LOW (ref >60)
Glucose, Bld: 101 mg/dL — ABNORMAL HIGH (ref 70–99)
Potassium: 4.9 mmol/L (ref 3.5–5.1)
Sodium: 137 mmol/L (ref 135–145)
Total Bilirubin: 1 mg/dL (ref 0.0–1.2)
Total Protein: 5.4 g/dL — ABNORMAL LOW (ref 6.5–8.1)

## 2023-12-04 MED ORDER — ROSUVASTATIN CALCIUM 5 MG PO TABS
10.0000 mg | ORAL_TABLET | Freq: Every day | ORAL | Status: DC
  Administered 2023-12-05 – 2023-12-08 (×4): 10 mg via ORAL
  Filled 2023-12-04 (×4): qty 2

## 2023-12-04 MED ORDER — CLOPIDOGREL BISULFATE 75 MG PO TABS
75.0000 mg | ORAL_TABLET | Freq: Every day | ORAL | Status: DC
Start: 2023-12-04 — End: 2023-12-08
  Administered 2023-12-04 – 2023-12-08 (×5): 75 mg via ORAL
  Filled 2023-12-04 (×5): qty 1

## 2023-12-04 MED ORDER — PANTOPRAZOLE SODIUM 40 MG PO TBEC
40.0000 mg | DELAYED_RELEASE_TABLET | Freq: Every day | ORAL | Status: DC
  Administered 2023-12-05 – 2023-12-08 (×4): 40 mg via ORAL
  Filled 2023-12-04 (×4): qty 1

## 2023-12-04 MED ORDER — ASPIRIN 81 MG PO TBEC: 81.0000 mg | DELAYED_RELEASE_TABLET | Freq: Every day | ORAL | Status: DC

## 2023-12-04 MED ORDER — BUDESON-GLYCOPYRROL-FORMOTEROL 160-9-4.8 MCG/ACT IN AERO
2.0000 | INHALATION_SPRAY | Freq: Two times a day (BID) | RESPIRATORY_TRACT | Status: DC
  Administered 2023-12-05 – 2023-12-08 (×7): 2 via RESPIRATORY_TRACT
  Filled 2023-12-04: qty 5.9

## 2023-12-04 MED ORDER — ACETAMINOPHEN 650 MG RE SUPP: 650.0000 mg | RECTAL | Status: DC | PRN

## 2023-12-04 MED ORDER — LOSARTAN POTASSIUM 50 MG PO TABS: 50.0000 mg | ORAL_TABLET | Freq: Every day | ORAL | Status: DC

## 2023-12-04 MED ORDER — ACETAMINOPHEN 160 MG/5ML PO SOLN: 650.0000 mg | ORAL | Status: DC | PRN

## 2023-12-04 MED ORDER — SODIUM CHLORIDE 0.9 % IV BOLUS
500.0000 mL | Freq: Once | INTRAVENOUS | Status: AC
  Administered 2023-12-04: 500 mL via INTRAVENOUS

## 2023-12-04 MED ORDER — ALBUTEROL SULFATE (2.5 MG/3ML) 0.083% IN NEBU
2.5000 mg | INHALATION_SOLUTION | RESPIRATORY_TRACT | Status: DC | PRN
  Administered 2023-12-05: 2.5 mg via RESPIRATORY_TRACT
  Filled 2023-12-04: qty 3

## 2023-12-04 MED ORDER — STROKE: EARLY STAGES OF RECOVERY BOOK
Freq: Once | Status: AC
  Filled 2023-12-04: qty 1

## 2023-12-04 MED ORDER — SODIUM CHLORIDE 0.9 % IV SOLN: INTRAVENOUS | Status: DC

## 2023-12-04 MED ORDER — GADOBUTROL 1 MMOL/ML IV SOLN
8.0000 mL | Freq: Once | INTRAVENOUS | Status: AC | PRN
  Administered 2023-12-04: 8 mL via INTRAVENOUS

## 2023-12-04 MED ORDER — TAMSULOSIN HCL 0.4 MG PO CAPS
0.4000 mg | ORAL_CAPSULE | Freq: Every day | ORAL | Status: DC
Start: 2023-12-05 — End: 2023-12-08
  Administered 2023-12-05 – 2023-12-07 (×3): 0.4 mg via ORAL
  Filled 2023-12-04 (×3): qty 1

## 2023-12-04 MED ORDER — ASPIRIN 81 MG PO TBEC
81.0000 mg | DELAYED_RELEASE_TABLET | Freq: Every day | ORAL | Status: DC
  Administered 2023-12-04 – 2023-12-08 (×5): 81 mg via ORAL
  Filled 2023-12-04 (×5): qty 1

## 2023-12-04 MED ORDER — CHLORHEXIDINE GLUCONATE CLOTH 2 % EX PADS
6.0000 | MEDICATED_PAD | Freq: Every day | CUTANEOUS | Status: DC
  Administered 2023-12-04 – 2023-12-07 (×4): 6 via TOPICAL

## 2023-12-04 MED ORDER — SENNOSIDES-DOCUSATE SODIUM 8.6-50 MG PO TABS
1.0000 | ORAL_TABLET | Freq: Every day | ORAL | Status: DC
  Administered 2023-12-05 – 2023-12-07 (×3): 1 via ORAL
  Filled 2023-12-04 (×3): qty 1

## 2023-12-04 MED ORDER — ACETAMINOPHEN 325 MG PO TABS
650.0000 mg | ORAL_TABLET | ORAL | Status: DC | PRN
Start: 2023-12-04 — End: 2023-12-08
  Administered 2023-12-05 (×2): 650 mg via ORAL
  Filled 2023-12-04 (×2): qty 2

## 2023-12-04 NOTE — Consult Note (Signed)
 NEUROLOGY CONSULT NOTE   Date of service: December 04, 2023 Patient Name: Stephen Wilkins STATES MRN:  [id number] DOB:  05/10/1927 Chief Complaint: Left leg weakness and new stroke seen on MRI Requesting Provider: Georgina Basket, MD  History of Present Illness  Stephen Wilkins is a 89 y.o. male with hx of hypertension, hyperlipidemia, GERD, dementia, COPD, anxiety, hypothyroidism, esophageal stricture, CKD, recent fall, GI bleed and thoracic aortic aneurysm presented today with weakness in the left leg and inability to urinate.  MRI brain showed acute ischemicsin a watershed distribution in the right MCA territory as well as a left frontal infarct.   Patient is uncertain when his left leg weakness started, but he and family believe it has been at least a few days. On 8/29, presented to [facility] ED after falling onto his left side. CTH  & CT spine negative. His family also reports that a few days ago, he had difficulty seeing out of his left eye which his eye doctor was unable to find a cause. His aide has also noticed drooping on the left side for the past 2 weeks.   He reports a recent COVID infection with cough as well as some recent constipation but no other recent symptoms.  LKW: Unclear, at least several days ago Modified rankin score: 2-Slight disability-UNABLE to perform all activities but does not need assistance IV Thrombolysis: No, outside of window EVT: No, completed stroke seen on MRI  NIHSS components Score: Comment  1a Level of Conscious 0[x]  1[]  2[]  3[]      1b LOC Questions 0[]  1[x]  2[]       1c LOC Commands 0[x]  1[]  2[]       2 Best Gaze 0[x]  1[]  2[]       3 Visual 0[x]  1[]  2[]  3[]      4 Facial Palsy 0[]  1[x]  2[]  3[]      5a Motor Arm - left 0[]  1[x]  2[]  3[]  4[]  UN[]    5b Motor Arm - Right 0[x]  1[]  2[]  3[]  4[]  UN[]    6a Motor Leg - Left 0[]  1[]  2[x]  3[]  4[]  UN[]    6b Motor Leg - Right 0[x]  1[]  2[]  3[]  4[]  UN[]    7 Limb Ataxia 0[x]  1[]  2[]  UN[]      8 Sensory 0[x]  1[]  2[]  UN[]      9 Best  Language 0[x]  1[]  2[]  3[]      10 Dysarthria 0[x]  1[]  2[]  UN[]      11 Extinct. and Inattention 0[x]  1[]  2[]       TOTAL:5       ROS  Comprehensive ROS performed and pertinent positives documented in HPI   Past History   Past Medical History:  Diagnosis Date   ABNORMAL ELECTROCARDIOGRAM 06/21/2007   ALLERGIC RHINITIS 03/23/2007   Anxiety state, unspecified 09/06/2013   ASTHMATIC BRONCHITIS, ACUTE 04/27/2007   BENIGN PROSTATIC HYPERTROPHY 03/23/2007   BRONCHITIS NOT SPECIFIED AS ACUTE OR CHRONIC 04/21/2007   BURSITIS, RIGHT HIP 07/31/2008   COPD (chronic obstructive pulmonary disease) (HCC) 04/19/2016   Cough 01/29/2009   Dementia (HCC) 09/01/2010   ERECTILE DYSFUNCTION 03/23/2007   ERECTILE DYSFUNCTION, ORGANIC 04/17/2009   Esophageal stricture    FREQUENCY, URINARY 04/26/2010   GERD 03/23/2007   HIATAL HERNIA    HYPERLIPIDEMIA 03/23/2007   HYPERTENSION 03/23/2007   MILD COGNITIVE IMPAIRMENT SO STATED 05/19/2010   NEPHROLITHIASIS, HX OF 03/23/2007   PSA, INCREASED 06/27/2008   RASH-NONVESICULAR 03/23/2007   REACTIVE AIRWAY DISEASE 01/14/2010   SCHATZKI'S RING    SCIATICA, RIGHT 04/28/2008   Unspecified hypothyroidism 09/06/2013  Wheezing 04/17/2009    Past Surgical History:  Procedure Laterality Date   COLONOSCOPY WITH PROPOFOL  N/A 11/12/2022   Procedure: COLONOSCOPY WITH PROPOFOL ;  Surgeon: Wilhelmenia Aloha Raddle., MD;  Location: [facility];  Service: Gastroenterology;  Laterality: N/A;   ERCP N/A 12/28/2018   Procedure: ENDOSCOPIC RETROGRADE CHOLANGIOPANCREATOGRAPHY (ERCP);  Surgeon: Aneita Gwendlyn DASEN, MD;  Location: [facility];  Service: Endoscopy;  Laterality: N/A;   HEMOSTASIS CLIP PLACEMENT  11/12/2022   Procedure: HEMOSTASIS CLIP PLACEMENT;  Surgeon: Wilhelmenia Aloha Raddle., MD;  Location: [facility];  Service: Gastroenterology;;   POLYPECTOMY  11/12/2022   Procedure: POLYPECTOMY;  Surgeon: Wilhelmenia Aloha Raddle., MD;  Location: [facility];  Service: Gastroenterology;;    REMOVAL OF STONES  12/28/2018   Procedure: REMOVAL OF STONES;  Surgeon: Aneita Gwendlyn DASEN, MD;  Location: [facility];  Service: Endoscopy;;  balloon sweep   SPHINCTEROTOMY  12/28/2018   Procedure: SPHINCTEROTOMY;  Surgeon: Aneita Gwendlyn DASEN, MD;  Location: [facility];  Service: Endoscopy;;   TONSILLECTOMY      Family History: Family History  Problem Relation Age of Onset   Lung cancer Brother        smoked   Cancer Other        lung cancer   Hypertension Other    Emphysema Brother        smoked   Asthma Sister     Social History  reports that he has never smoked. He has never used smokeless tobacco. He reports that he does not drink alcohol and does not use drugs.  Allergies  Allergen Reactions   Atorvastatin      REACTION: myalgia   Doxazosin Mesylate     REACTION: dizziness   Hydrocodone  Bit-Homatrop Mbr Other (See Comments)    anxiety   Hydrocodone  Anxiety   Valacyclovir Diarrhea    Medications   Current Facility-Administered Medications:    [START ON 12/05/2023]  stroke: early stages of recovery book, , Does not apply, Once, Georgina Basket, MD   0.9 %  sodium chloride  infusion, , Intravenous, Continuous, Georgina Basket, MD   acetaminophen  (TYLENOL ) tablet 650 mg, 650 mg, Oral, Q4H PRN **OR** acetaminophen  (TYLENOL ) 160 MG/5ML solution 650 mg, 650 mg, Per Tube, Q4H PRN **OR** acetaminophen  (TYLENOL ) suppository 650 mg, 650 mg, Rectal, Q4H PRN, Georgina Basket, MD   albuterol  (PROVENTIL ) (2.5 MG/3ML) 0.083% nebulizer solution 2.5 mg, 2.5 mg, Nebulization, Q4H PRN, Georgina Basket, MD   aspirin  EC tablet 81 mg, 81 mg, Oral, Daily, de Clint Kill, Cortney E, NP   budesonide -glycopyrrolate -formoterol  (BREZTRI ) 160-9-4.8 MCG/ACT inhaler 2 puff, 2 puff, Inhalation, BID, Georgina Basket, MD   clopidogrel  (PLAVIX ) tablet 75 mg, 75 mg, Oral, Daily, de Clint Kill, Cortney E, NP   pantoprazole  (PROTONIX ) EC tablet 40 mg, 40 mg, Oral, Daily, Georgina Basket, MD   rosuvastatin  (CRESTOR ) tablet 10  mg, 10 mg, Oral, Daily, Georgina Basket, MD   senna-docusate (Senokot-S) tablet 1 tablet, 1 tablet, Oral, QHS, Georgina Basket, MD   tamsulosin  (FLOMAX ) capsule 0.4 mg, 0.4 mg, Oral, QPC supper, Georgina Basket, MD  Current Outpatient Medications:    albuterol  (PROVENTIL ) (2.5 MG/3ML) 0.083% nebulizer solution, Take 3 mLs (2.5 mg total) by nebulization every 4 (four) hours as needed for wheezing or shortness of breath., Disp: 150 mL, Rfl: 0   aspirin  EC 81 MG tablet, Take by mouth. (Patient not taking: Reported on 11/22/2023), Disp: , Rfl:    clobetasol (TEMOVATE) 0.05 % external solution, Apply 1ml TO SCALP twice a DAY FOR 1-2 WEEKS; THEN  USE as needed. (Patient not taking: Reported on 11/22/2023), Disp: , Rfl:    desoximetasone  (TOPICORT ) 0.25 % cream, APPLY SPARINGLY TO THE AFFECTED AREA TWICE DAILY AS NEEDED AS DIRECTED (Patient taking differently: Apply 1 application  topically 2 (two) times daily. Itching), Disp: 30 g, Rfl: 1   Fluticasone -Umeclidin-Vilant (TRELEGY ELLIPTA ) 100-62.5-25 MCG/ACT AEPB, Inhale 1 puff into the lungs daily., Disp: 1 each, Rfl: 11   levofloxacin  (LEVAQUIN ) 500 MG tablet, Take 1 tablet (500 mg total) by mouth daily., Disp: 10 tablet, Rfl: 0   losartan  (COZAAR ) 100 MG tablet, Take 1/2 (one-half) tablet by mouth once daily, Disp: 45 tablet, Rfl: 0   Multiple Vitamin (MULTIVITAMINS PO), Take 1 tablet by mouth daily., Disp: , Rfl:    Multiple Vitamins-Minerals (CENTRUM SILVER PO), Take 1 tablet by mouth daily. , Disp: , Rfl:    pantoprazole  (PROTONIX ) 40 MG tablet, Take 1 tablet (40 mg total) by mouth daily., Disp: 90 tablet, Rfl: 3   polyethylene glycol powder (MIRALAX ) powder, Take 1/2 capful by mouth once daily, Disp: 850 g, Rfl: 5   rosuvastatin  (CRESTOR ) 10 MG tablet, Take 1 tablet (10 mg total) by mouth daily., Disp: 90 tablet, Rfl: 3   solifenacin  (VESICARE ) 5 MG tablet, Take 1 tablet (5 mg total) by mouth daily., Disp: 90 tablet, Rfl: 3   tamsulosin  (FLOMAX ) 0.4 MG CAPS  capsule, Take 1 capsule (0.4 mg total) by mouth in the morning and at bedtime., Disp: 180 capsule, Rfl: 3   vitamin B-12 (CYANOCOBALAMIN ) 100 MCG tablet, Take 100 mcg by mouth once a week., Disp: , Rfl:   Vitals   Vitals:   01/01/2024 1400 01-Jan-2024 1415 2024-01-01 1530 Jan 01, 2024 1531  BP: 99/63 112/66 99/67   Pulse: 86 81 93   Resp: 16 19 18    Temp:    97.8 F (36.6 C)  TempSrc:    Oral  SpO2: 97% 97% 97%   Weight:      Height:        Body mass index is 24.95 kg/m.   Physical Exam   Constitutional: Well-developed, well-nourished elderly patient with bruising over the left eye and left side of the forehead in no acute distress Psych: Affect appropriate to situation.  Eyes: No scleral injection.  HENT: No OP obstruction.  Head: Normocephalic.  Cardiovascular: Normal rate and regular rhythm.  Respiratory: Effort normal, non-labored breathing.  Skin: WDI.   Neurologic Examination    NEURO:  Mental Status: AA&Ox3, able to give some but not all history of present illness Speech/Language: speech is without dysarthria or aphasia.    Cranial Nerves:  II: PERRL. Visual fields full.  III, IV, VI: EOMI. Eyelids elevate symmetrically.  V: Sensation is intact to light touch and symmetrical to face.  VII: Left facial droop VIII: hearing intact to voice. IX, X: Phonation is normal.  KP:Dynloizm shrug 5/5. XII: tongue is midline without fasciculations. Motor: Able to move all 4 extremities with antigravity strength, but slight drift present in the left upper extremity, able to lift bilateral lower extremities off the bed, but drift with left leg hitting the bed present Tone: is normal and bulk is normal Sensation- Intact to light touch bilaterally. Extinction absent to light touch to DSS.   Coordination: FTN intact bilaterally Gait- deferred   Labs/Imaging/Neurodiagnostic studies   CBC:  Recent Labs  Lab 01/01/2024 1256  WBC 11.3*  NEUTROABS 8.2*  HGB 10.8*  HCT 33.8*  MCV  99.4  PLT 207   Basic Metabolic Panel:  Lab Results  Component Value Date   NA 137 12/04/2023   K 4.9 12/04/2023   CO2 25 12/04/2023   GLUCOSE 101 (H) 12/04/2023   BUN 58 (H) 12/04/2023   CREATININE 3.02 (H) 12/04/2023   CALCIUM  8.7 (L) 12/04/2023   GFRNONAA 19 (L) 12/04/2023   GFRAA 32 (L) 11/22/2019   Lipid Panel:  Lab Results  Component Value Date   LDLCALC 57 09/20/2023   HgbA1c:  Lab Results  Component Value Date   HGBA1C 6.4 09/20/2023   Urine Drug Screen: No results found for: LABOPIA, COCAINSCRNUR, LABBENZ, AMPHETMU, THCU, LABBARB  Alcohol Level No results found for: [facility] INR  Lab Results  Component Value Date   INR 1.1 11/12/2022    MR Angio head w/o contrast w TOF (Personally reviewed): No significant intracranial stenosis. Mild atherosclerotic changes in bilateral ICA.  MR Angio neck w/wo contrast (Personally reviewed): Proximal R ICA with >70% stenosis as per radiology report.  MRI Brain(Personally reviewed): Scattered foci of acute or subacute ischemic infarcts in right MCA watershed distribution, single focus of acute infarct in anterior left frontal lobe, remote lacunar infarcts in basal ganglia and thalami  MRI lumbar spine: Grade 1 anterolisthesis at L4-L5, mild leftward disc protrusion at L1-L2, broad-based disc protrusion at L2-L3 with moderate right and mild left subarticular stenosis and mild right foraminal narrowing, stable leftward broad-based disc protrusion at L3-L4 with mild left subarticular and foraminal stenosis, mild subarticular and left foraminal narrowing at L4-L5  ASSESSMENT   JAMEAR CARBONNEAU is a 89 y.o. male with hx of hypertension, hyperlipidemia, GERD, dementia, COPD, anxiety, hypothyroidism, esophageal stricture, CKD, recent fall, GI bleed and thoracic aortic aneurysm who presents with urinary retention and left leg weakness. Left leg weakness onset possibly 8/29 when he had his fall. This is in the setting of left  facial droop that started roughly two weeks ago. MRI reveals right MCA watershed distribution strokes as well as a single left frontal stroke. MRA neck showing significant R ICA stenosis (>70%), however as this modality overestimates stenosis, carotid US  ordered.Risk factors for stroke include age, hypertension, hyperlipidemia, atherosclerosis (history of severe atherosclerosis of aorta [facility]), history of smoking. He will need admission with full stroke workup.  RECOMMENDATIONS  Stroke/TIA Workup  - Admit for stroke workup - Permissive HTN x48 hrs goal BP <220/110. PRN labetalol or hydralazine  if BP above these parameters. Avoid oral antihypertensives. - Carotid US  (ordered, MRA with prox R ICA 70% stenosis) - TTE  - Check A1c and LDL + add statin per guidelines - Aspirin  81 mg daily and Plavix  75 mg daily antiplt/anticoag - q4 hr neuro checks - STAT head CT for any change in neuro exam - Tele - PT/OT/SLP - Stroke education - Amb referral to neurology upon discharge   ______________________________________________________________________  Patient seen by NP and then by MD, MD to edit note as needed.  Signed, Cortney E Everitt Clint Kill, NP [facility]   I have reviewed and examined the patient as well as the NP's note and have made revisions as necessary.  Joahan Swatzell, MD [facility]

## 2023-12-04 NOTE — Plan of Care (Signed)

## 2023-12-04 NOTE — ED Triage Notes (Signed)
 Pt from home and home health c/o left sided weakness. Pt states he can not urinate. Denies left sided weakness. Axox4. VSS.

## 2023-12-04 NOTE — H&P (Signed)
 History and Physical    Patient: Stephen Wilkins FMW:[id number] DOB: Apr 16, 1927 DOA: 12/04/2023 DOS: the patient was seen and examined on 12/04/2023 PCP: Norleen Lynwood ORN, MD  Patient coming from: Home  Chief Complaint:  Chief Complaint  Patient presents with   Urinary Retention   HPI: Stephen Wilkins is a 89 y.o. male with medical history significant of dementia, COPD, BPH, esophageal stricture, HTN, HLD, and diverticulosis p/w GLF iso acute CVA.  The patient experienced a fall on Friday afternoon, resulting in a gash on the head and a black eye. Following the fall, the patient was transported to [facility], where a CT scan was performed and pt eventually discharged home the same day. Upon returning home, the pt was unable to ambulate or move LLE; thus, he re-presented to the ED today and MRI revealed the presence of a stroke.   In ED, pt AFVSS. Labs Cr 3.02 (labile baseline Cr 1.6-2.4). MRI brain showed Scattered foci of acute/subacute nonhemorrhagic infarcts in a right watershed distribution, including the right precentral gyrus, right middle frontal gyrus, and lateral right occipital lobe. This suggests either right carotid stenosis or right-sided embolic disease. EDP consulted Neuro and requested medicine admission.  Review of Systems: As mentioned in the history of present illness. All other systems reviewed and are negative. Past Medical History:  Diagnosis Date   ABNORMAL ELECTROCARDIOGRAM 06/21/2007   ALLERGIC RHINITIS 03/23/2007   Anxiety state, unspecified 09/06/2013   ASTHMATIC BRONCHITIS, ACUTE 04/27/2007   BENIGN PROSTATIC HYPERTROPHY 03/23/2007   BRONCHITIS NOT SPECIFIED AS ACUTE OR CHRONIC 04/21/2007   BURSITIS, RIGHT HIP 07/31/2008   COPD (chronic obstructive pulmonary disease) (HCC) 04/19/2016   Cough 01/29/2009   Dementia (HCC) 09/01/2010   ERECTILE DYSFUNCTION 03/23/2007   ERECTILE DYSFUNCTION, ORGANIC 04/17/2009   Esophageal stricture    FREQUENCY, URINARY 04/26/2010   GERD  03/23/2007   HIATAL HERNIA    HYPERLIPIDEMIA 03/23/2007   HYPERTENSION 03/23/2007   MILD COGNITIVE IMPAIRMENT SO STATED 05/19/2010   NEPHROLITHIASIS, HX OF 03/23/2007   PSA, INCREASED 06/27/2008   RASH-NONVESICULAR 03/23/2007   REACTIVE AIRWAY DISEASE 01/14/2010   SCHATZKI'S RING    SCIATICA, RIGHT 04/28/2008   Unspecified hypothyroidism 09/06/2013   Wheezing 04/17/2009   Past Surgical History:  Procedure Laterality Date   COLONOSCOPY WITH PROPOFOL  N/A 11/12/2022   Procedure: COLONOSCOPY WITH PROPOFOL ;  Surgeon: Wilhelmenia Aloha Raddle., MD;  Location: [facility];  Service: Gastroenterology;  Laterality: N/A;   ERCP N/A 12/28/2018   Procedure: ENDOSCOPIC RETROGRADE CHOLANGIOPANCREATOGRAPHY (ERCP);  Surgeon: Aneita Gwendlyn DASEN, MD;  Location: [facility];  Service: Endoscopy;  Laterality: N/A;   HEMOSTASIS CLIP PLACEMENT  11/12/2022   Procedure: HEMOSTASIS CLIP PLACEMENT;  Surgeon: Wilhelmenia Aloha Raddle., MD;  Location: [facility];  Service: Gastroenterology;;   POLYPECTOMY  11/12/2022   Procedure: POLYPECTOMY;  Surgeon: Wilhelmenia Aloha Raddle., MD;  Location: [facility];  Service: Gastroenterology;;   REMOVAL OF STONES  12/28/2018   Procedure: REMOVAL OF STONES;  Surgeon: Aneita Gwendlyn DASEN, MD;  Location: [facility];  Service: Endoscopy;;  balloon sweep   SPHINCTEROTOMY  12/28/2018   Procedure: SPHINCTEROTOMY;  Surgeon: Aneita Gwendlyn DASEN, MD;  Location: [facility];  Service: Endoscopy;;   TONSILLECTOMY     Social History:  reports that he has never smoked. He has never used smokeless tobacco. He reports that he does not drink alcohol and does not use drugs.  Allergies  Allergen Reactions   Atorvastatin      REACTION: myalgia   Doxazosin Mesylate  REACTION: dizziness   Hydrocodone  Bit-Homatrop Mbr Other (See Comments)    anxiety   Hydrocodone  Anxiety   Valacyclovir Diarrhea    Family History  Problem Relation Age of Onset   Lung cancer Brother        smoked   Cancer Other         lung cancer   Hypertension Other    Emphysema Brother        smoked   Asthma Sister     Prior to Admission medications   Medication Sig Start Date End Date Taking? Authorizing Provider  albuterol  (PROVENTIL ) (2.5 MG/3ML) 0.083% nebulizer solution Take 3 mLs (2.5 mg total) by nebulization every 4 (four) hours as needed for wheezing or shortness of breath. 11/28/23   [name], FNP  aspirin  EC 81 MG tablet Take by mouth. Patient not taking: Reported on 11/22/2023 02/10/16   [provider]  clobetasol (TEMOVATE) 0.05 % external solution Apply 1ml TO SCALP twice a DAY FOR 1-2 WEEKS; THEN USE as needed. Patient not taking: Reported on 11/22/2023 05/26/23   [provider]  desoximetasone  (TOPICORT ) 0.25 % cream APPLY SPARINGLY TO THE AFFECTED AREA TWICE DAILY AS NEEDED AS DIRECTED Patient taking differently: Apply 1 application  topically 2 (two) times daily. Itching 04/30/18   Norleen Lynwood ORN, MD  Fluticasone -Umeclidin-Vilant (TRELEGY ELLIPTA ) 100-62.5-25 MCG/ACT AEPB Inhale 1 puff into the lungs daily. 06/01/23   Norleen Lynwood ORN, MD  levofloxacin  (LEVAQUIN ) 500 MG tablet Take 1 tablet (500 mg total) by mouth daily. 11/22/23   Norleen Lynwood ORN, MD  losartan  (COZAAR ) 100 MG tablet Take 1/2 (one-half) tablet by mouth once daily 07/12/23   Norleen Lynwood ORN, MD  Multiple Vitamin (MULTIVITAMINS PO) Take 1 tablet by mouth daily. 03/02/22   [provider]  Multiple Vitamins-Minerals (CENTRUM SILVER PO) Take 1 tablet by mouth daily.     [provider]  pantoprazole  (PROTONIX ) 40 MG tablet Take 1 tablet (40 mg total) by mouth daily. 06/01/23   Norleen Lynwood ORN, MD  polyethylene glycol powder (MIRALAX ) powder Take 1/2 capful by mouth once daily 01/28/15   Norleen Lynwood ORN, MD  rosuvastatin  (CRESTOR ) 10 MG tablet Take 1 tablet (10 mg total) by mouth daily. 06/01/23   Norleen Lynwood ORN, MD  solifenacin  (VESICARE ) 5 MG tablet Take 1 tablet (5 mg total) by mouth daily. 06/01/23   Norleen Lynwood ORN, MD  tamsulosin  (FLOMAX ) 0.4 MG CAPS capsule Take 1 capsule (0.4 mg total) by mouth in the morning and at bedtime. 06/01/23   Norleen Lynwood ORN, MD  vitamin B-12 (CYANOCOBALAMIN ) 100 MCG tablet Take 100 mcg by mouth once a week.    [provider]    Physical Exam: Vitals:   12/04/23 1400 12/04/23 1415 12/04/23 1530 12/04/23 1531  BP: 99/63 112/66 99/67   Pulse: 86 81 93   Resp: 16 19 18    Temp:    97.8 F (36.6 C)  TempSrc:    Oral  SpO2: 97% 97% 97%   Weight:      Height:       General: Alert, oriented x3, resting comfortably in no acute distress Respiratory: Lungs clear to auscultation bilaterally with normal respiratory effort; no w/r/r Cardiovascular: Regular rate and rhythm w/o m/r/g   Data Reviewed: {Tip this will not be part of the note when signed- Document your independent interpretation of telemetry tracing, EKG, lab, Radiology test or any other diagnostic tests. Add any new diagnostic test ordered today. (Optional):[id number]}  Lab Results  Component Value Date   WBC 11.3 (H) 12/04/2023   HGB 10.8 (L) 12/04/2023   HCT 33.8 (L) 12/04/2023   MCV 99.4 12/04/2023   PLT 207 12/04/2023   Lab Results  Component Value Date   GLUCOSE 101 (H) 12/04/2023   CALCIUM  8.7 (L) 12/04/2023   NA 137 12/04/2023   K 4.9 12/04/2023   CO2 25 12/04/2023   CL 102 12/04/2023   BUN 58 (H) 12/04/2023   CREATININE 3.02 (H) 12/04/2023   Lab Results  Component Value Date   ALT 13 12/04/2023   AST 18 12/04/2023   ALKPHOS 50 12/04/2023   BILITOT 1.0 12/04/2023   Lab Results  Component Value Date   INR 1.1 11/12/2022   Radiology: MR LUMBAR SPINE WO CONTRAST Result Date: 12/04/2023 EXAM: MRI LUMBAR SPINE 12/04/2023 02:53:59 PM TECHNIQUE: Multiplanar multisequence MRI of the lumbar spine was performed without the administration of intravenous contrast. COMPARISON: MRI of the lumbar spine 07/12/2014. CLINICAL HISTORY: Low back pain, trauma; Left leg weakness that is new. Recent  fall. Also urinary retention that is new. Pt from home and home health c/o left sided weakness. Pt states he can not urinate. Denies left sided weakness. Axox4. VSS. FINDINGS: BONES AND ALIGNMENT: Mild leftward curvature is centered at L2-3. Slight retrolisthesis at L2-3 is stable. Grade 1 anterolisthesis at L4-5 has progressed. SPINAL CORD: The conus terminates normally. SOFT TISSUES: A 4.3 cm simple cyst is present at the lower pole of the right kidney. Left renal atrophy is new since the prior study. Atherosclerotic changes are present within the abdominal aorta. Maximal diameter is 31 mm. L1-L2: Mild leftward disc protrusion at L1-2 is new without significant stenosis. L2-L3: A broad-based disc protrusion, asymmetric to the right, and moderate bilateral facet hypertrophy has progressed at L2-3. Moderate right and mild left subarticular stenosis is present. Mild right foraminal narrowing is present. L3-L4: A leftward broad-based disc protrusion is similar to prior study. Mild left subarticular and foraminal stenosis is stable. L4-L5: Progressive facet hypertrophy is present bilaterally. Moderate subarticular and left foraminal narrowing is similar to the prior study. L5-S1: Mild facet hypertrophy is present at L5 to S1. No focal disc protrusion or stenosis is present. IMPRESSION: 1. Progressed grade 1 anterolisthesis at L4-5. 2. New mild leftward disc protrusion at L1-2 without significant stenosis. 3. Progressed broad-based disc protrusion at L2-3 with moderate right and mild left subarticular stenosis and mild right foraminal narrowing. 4. Stable leftward broad-based disc protrusion at L3-4 with mild left subarticular and foraminal stenosis. 5. Moderate subarticular and left foraminal narrowing at L4-5, similar to prior study. Electronically signed by: Lonni Necessary MD 12/04/2023 03:40 PM EDT RP Workstation: [id number]   MR BRAIN WO CONTRAST Result Date: 12/04/2023 EXAM: MR Brain Without Intravenous  Contrast. CLINICAL HISTORY: Head trauma, minor (Age >= 65y). Pt from home and home health c/o left sided weakness. Pt states he can not urinate. Denies left sided weakness. Axox4. VSS. TECHNIQUE: Magnetic resonance images of the brain without intravenous contrast in multiple planes. CONTRAST: None. COMPARISON: CT head without contrast 12/01/2023. FINDINGS: BRAIN: Scattered foci of acute/subacute nonhemorrhagic infarcts are present in a right watershed distribution. Infarcts are present within the right precentral gyrus. More confluent areas of infarction are present anteriorly in the right middle frontal gyrus and in the lateral right occipital lobe. A single focus of acute/subacute infarction is present anteriorly in the left frontal lobe. White matter changes extend into the brainstem. Remote lacunar infarcts are present within the  basal ganglia and thalami bilaterally. Remote lacunar infarcts are present in the right cerebral cerebellar hemisphere. Susceptibility artifact are present in the anterior frontal lobes bilaterally. VENTRICLES: No hydrocephalus. ORBITS: CALL MEASURES CURRENT ER CALL MEASURES CURRENT EMERGENCY DEPARTMENT Bilateral lens replacements are noted. The globes and orbits are otherwise within normal limits. SINUSES AND MASTOIDS: Moderate mucosal thickening is present in the anterior ethmoid air cells bilaterally. The right frontal sinus is near completely opacified. The left frontal sinus is hypoplastic. A small right mastoid effusion is present. No obstructing nasopharyngeal lesion is present. BONES: No acute fracture or focal osseous lesion. IMPRESSION: 1. Scattered foci of acute/subacute nonhemorrhagic infarcts in a right watershed distribution, including the right precentral gyrus, right middle frontal gyrus, and lateral right occipital lobe. This suggests either right carotid stenosis or right-sided embolic disease 2. A single focus of acute/subacute infarction is present anteriorly in the  left frontal lobe. 3. Remote lacunar infarcts in the basal ganglia and thalami bilaterally, as well as in the right cerebral cerebellar hemisphere. 4. Moderate mucosal thickening in the anterior ethmoid air cells bilaterally and near complete opacification of the right frontal sinus. 5. Small right mastoid effusion. Findings were called to Dr. Ginger at 3:30pm Electronically signed by: Lonni Necessary MD 12/04/2023 03:34 PM EDT RP Workstation: [id number]    Assessment and Plan: 38M h/o dementia, COPD, BPH, esophageal stricture, HTN, HLD, and diverticulosis p/w GLF iso acute CVA.  Acute CVA -Neuro following; appreciate eval/recs -PT/OT/SLP following; appreciate recs -Cardiac telemetry; consider 30d holter monitor at d/c if no arrhythmias captures -Allow permissive HTN (220/120 due to acute stroke) -Risk factor modification -Frequent neuro checks per protocol -F/u HgbA1c, fasting lipid panel -F/u TTE  HTN -HOLD pta losartan   HLD -PTA Crestor  10mg  daily (increased dosing deferred given age)  COPD -Breztri  BID (in lieu pta Trelegy) and albuterol  neb q4h prn  BPH -PTA flomax  0.4mg  daily   Advance Care Planning:   Code Status: Limited: Do not attempt resuscitation (DNR) -DNR-LIMITED -Do Not Intubate/DNI    Consults: Neurology  Family Communication: Son and daughter  Severity of Illness: The appropriate patient status for this patient is INPATIENT. Inpatient status is judged to be reasonable and necessary in order to provide the required intensity of service to ensure the patient's safety. The patient's presenting symptoms, physical exam findings, and initial radiographic and laboratory data in the context of their chronic comorbidities is felt to place them at high risk for further clinical deterioration. Furthermore, it is not anticipated that the patient will be medically stable for discharge from the hospital within 2 midnights of admission.   * I certify that at the point of  admission it is my clinical judgment that the patient will require inpatient hospital care spanning beyond 2 midnights from the point of admission due to high intensity of service, high risk for further deterioration and high frequency of surveillance required.*   ------- I spent 60 minutes reviewing previous notes, at the bedside counseling/discussing the treatment plan, and performing clinical documentation.  Author: Marsha Ada, MD 12/04/2023 4:19 PM  For on call review [email or web address].

## 2023-12-04 NOTE — ED Notes (Signed)
 Pt transported to MRI, Bed ready upstairs called receiving nurse and pt to be brought up to room after MRI. Family aware and agreeable. VSS

## 2023-12-04 NOTE — ED Provider Notes (Signed)
 Patient signed out to me by previous provider at shift change pending neuroconsultation and admission to medicine team.  Briefly this is a 89 year old male who presented to the emergency department with left lower leg weakness and difficulty with urination.  Had a fall couple days ago where he had a head injury but no other acute findings at that time.  Concern for possible CVA versus lumbar injury.  MRI of the brain shows watershed infarcts on the right, lumbar spine looks unremarkable.  With recent head injury could be concern for possible vascular pathology.  Due to worsening AKI and GFR cannot get a CT angio.  For this reason MRI of the head and neck has been ordered.  Neurologist, Dr. Sallyann has been notified of the new finding of stroke.  Patient has been admitted to the medicine team.  Patients evaluation and results requires admission for further treatment and care.  Spoke with hospitalist, reviewed patient's ED course and they accept admission.  Patient agrees with admission plan, offers no new complaints and is stable/unchanged at time of admit.     Bari Roxie HERO, DO 12/04/23 [id number]

## 2023-12-04 NOTE — ED Provider Notes (Signed)
[place] [facility] AT [facility] Provider Note   CSN: [id number] Arrival date & time: 12/04/23  1136     Patient presents with: Urinary Retention   Stephen Wilkins is a 89 y.o. male.   The history is provided by the patient, medical records and a relative. The history is limited by the condition of the patient. No language interpreter was used.  Neurologic Problem This is a new problem. The current episode started yesterday. The problem occurs constantly. The problem has been gradually improving. Pertinent negatives include no chest pain, no abdominal pain, no headaches and no shortness of breath. Nothing aggravates the symptoms. Nothing relieves the symptoms. He has tried nothing for the symptoms. The treatment provided no relief.       Prior to Admission medications   Medication Sig Start Date End Date Taking? Authorizing Provider  albuterol  (PROVENTIL ) (2.5 MG/3ML) 0.083% nebulizer solution Take 3 mLs (2.5 mg total) by nebulization every 4 (four) hours as needed for wheezing or shortness of breath. 11/28/23   Alvia Corean LITTIE, FNP  aspirin  EC 81 MG tablet Take by mouth. Patient not taking: Reported on 11/22/2023 02/10/16   [provider]  clobetasol (TEMOVATE) 0.05 % external solution Apply 1ml TO SCALP twice a DAY FOR 1-2 WEEKS; THEN USE as needed. Patient not taking: Reported on 11/22/2023 05/26/23   [provider]  desoximetasone  (TOPICORT ) 0.25 % cream APPLY SPARINGLY TO THE AFFECTED AREA TWICE DAILY AS NEEDED AS DIRECTED Patient taking differently: Apply 1 application  topically 2 (two) times daily. Itching 04/30/18   Norleen Lynwood ORN, MD  Fluticasone -Umeclidin-Vilant (TRELEGY ELLIPTA ) 100-62.5-25 MCG/ACT AEPB Inhale 1 puff into the lungs daily. 06/01/23   Norleen Lynwood ORN, MD  levofloxacin  (LEVAQUIN ) 500 MG tablet Take 1 tablet (500 mg total) by mouth daily. 11/22/23   Norleen Lynwood ORN, MD  losartan  (COZAAR ) 100 MG tablet Take 1/2 (one-half) tablet  by mouth once daily 07/12/23   Norleen Lynwood ORN, MD  Multiple Vitamin (MULTIVITAMINS PO) Take 1 tablet by mouth daily. 03/02/22   [provider]  Multiple Vitamins-Minerals (CENTRUM SILVER PO) Take 1 tablet by mouth daily.     [provider]  pantoprazole  (PROTONIX ) 40 MG tablet Take 1 tablet (40 mg total) by mouth daily. 06/01/23   Norleen Lynwood ORN, MD  polyethylene glycol powder (MIRALAX ) powder Take 1/2 capful by mouth once daily 01/28/15   Norleen Lynwood ORN, MD  rosuvastatin  (CRESTOR ) 10 MG tablet Take 1 tablet (10 mg total) by mouth daily. 06/01/23   Norleen Lynwood ORN, MD  solifenacin  (VESICARE ) 5 MG tablet Take 1 tablet (5 mg total) by mouth daily. 06/01/23   Norleen Lynwood ORN, MD  tamsulosin  (FLOMAX ) 0.4 MG CAPS capsule Take 1 capsule (0.4 mg total) by mouth in the morning and at bedtime. 06/01/23   Norleen Lynwood ORN, MD  vitamin B-12 (CYANOCOBALAMIN ) 100 MCG tablet Take 100 mcg by mouth once a week.    [provider]    Allergies: Atorvastatin , Doxazosin mesylate, Hydrocodone  bit-homatrop mbr, Hydrocodone , and Valacyclovir    Review of Systems  Constitutional:  Positive for fatigue. Negative for appetite change, chills and fever.  HENT:  Negative for congestion.   Eyes:  Negative for visual disturbance.  Respiratory:  Negative for cough, chest tightness, shortness of breath and wheezing.   Cardiovascular:  Negative for chest pain, palpitations and leg swelling.  Gastrointestinal:  Negative for abdominal pain, constipation, diarrhea, nausea and vomiting.  Genitourinary:  Positive for  difficulty urinating.  Musculoskeletal:  Negative for back pain, neck pain and neck stiffness.  Skin:  Negative for rash and wound.  Neurological:  Positive for weakness. Negative for dizziness, speech difficulty, light-headedness, numbness and headaches.  Psychiatric/Behavioral:  Negative for agitation.   All other systems reviewed and are negative.   Updated Vital Signs Temp 98.1 F (36.7 C)  (Oral)   Ht 6' (1.829 m)   Wt 83.5 kg   BMI 24.95 kg/m   Physical Exam Vitals and nursing note reviewed.  Constitutional:      General: He is not in acute distress.    Appearance: He is well-developed. He is not ill-appearing, toxic-appearing or diaphoretic.  HENT:     Head: Contusion present.     Comments: Patient has bruising and raccoon eyes left eye.  Also has injury to left forehead that is still repaired.    Nose: No congestion.     Mouth/Throat:     Mouth: Mucous membranes are dry.     Pharynx: No oropharyngeal exudate or posterior oropharyngeal erythema.  Eyes:     Extraocular Movements: Extraocular movements intact.     Conjunctiva/sclera: Conjunctivae normal.     Pupils: Pupils are equal, round, and reactive to light.  Cardiovascular:     Rate and Rhythm: Normal rate and regular rhythm.     Heart sounds: No murmur heard. Pulmonary:     Effort: Pulmonary effort is normal. No respiratory distress.     Breath sounds: Normal breath sounds. No wheezing, rhonchi or rales.  Chest:     Chest wall: No tenderness.  Abdominal:     General: Abdomen is flat.     Palpations: Abdomen is soft.     Tenderness: There is no abdominal tenderness. There is no guarding or rebound.  Musculoskeletal:        General: No swelling or tenderness.     Cervical back: Neck supple. No tenderness.     Right lower leg: No edema.     Left lower leg: No edema.     Comments: Distally patient had no tenderness in his legs.  He had no tenderness in the left knee left ankle or left foot.  He has symmetric sensation in both legs and has a palpable pulse DP arteries bilaterally.  There was no difference in appearance of the feet in regards to temperature or pallor.  They both had normal capillary refill and although initially the patient would not lift his left leg for me, on reassessment he was able to lift it without weakness.  Seems to be more transient weakness in the left leg.  Family says that this is  been a problem for him in the past and they are worried about it contribute to his falls.  No tenderness on the back whatsoever.  Skin:    General: Skin is warm and dry.     Capillary Refill: Capillary refill takes less than 2 seconds.     Findings: Bruising present. No erythema.  Neurological:     Mental Status: He is alert.     Sensory: No sensory deficit.     Motor: Weakness present.  Psychiatric:        Mood and Affect: Mood normal.     (all labs ordered are listed, but only abnormal results are displayed) Labs Reviewed  CBC WITH DIFFERENTIAL/PLATELET - Abnormal; Notable for the following components:      Result Value   WBC 11.3 (*)    RBC 3.40 (*)  Hemoglobin 10.8 (*)    HCT 33.8 (*)    Neutro Abs 8.2 (*)    Abs Immature Granulocytes 0.10 (*)    All other components within normal limits  COMPREHENSIVE METABOLIC PANEL WITH GFR - Abnormal; Notable for the following components:   Glucose, Bld 101 (*)    BUN 58 (*)    Creatinine, Ser 3.02 (*)    Calcium  8.7 (*)    Total Protein 5.4 (*)    Albumin  2.9 (*)    GFR, Estimated 19 (*)    All other components within normal limits  URINALYSIS, W/ REFLEX TO CULTURE (INFECTION SUSPECTED)    EKG: EKG Interpretation Date/Time:  Monday December 04 2023 15:35:21 EDT Ventricular Rate:  85 PR Interval:  167 QRS Duration:  88 QT Interval:  351 QTC Calculation: 418 R Axis:   42  Text Interpretation: Sinus rhythm Abnormal R-wave progression, early transition when compared to prior, similar appearance No STEMI Confirmed by Ginger Barefoot ([id number]) on 12/04/2023 3:37:07 PM  Radiology: MR LUMBAR SPINE WO CONTRAST Result Date: 12/04/2023 EXAM: MRI LUMBAR SPINE 12/04/2023 02:53:59 PM TECHNIQUE: Multiplanar multisequence MRI of the lumbar spine was performed without the administration of intravenous contrast. COMPARISON: MRI of the lumbar spine 07/12/2014. CLINICAL HISTORY: Low back pain, trauma; Left leg weakness that is new. Recent fall.  Also urinary retention that is new. Pt from home and home health c/o left sided weakness. Pt states he can not urinate. Denies left sided weakness. Axox4. VSS. FINDINGS: BONES AND ALIGNMENT: Mild leftward curvature is centered at L2-3. Slight retrolisthesis at L2-3 is stable. Grade 1 anterolisthesis at L4-5 has progressed. SPINAL CORD: The conus terminates normally. SOFT TISSUES: A 4.3 cm simple cyst is present at the lower pole of the right kidney. Left renal atrophy is new since the prior study. Atherosclerotic changes are present within the abdominal aorta. Maximal diameter is 31 mm. L1-L2: Mild leftward disc protrusion at L1-2 is new without significant stenosis. L2-L3: A broad-based disc protrusion, asymmetric to the right, and moderate bilateral facet hypertrophy has progressed at L2-3. Moderate right and mild left subarticular stenosis is present. Mild right foraminal narrowing is present. L3-L4: A leftward broad-based disc protrusion is similar to prior study. Mild left subarticular and foraminal stenosis is stable. L4-L5: Progressive facet hypertrophy is present bilaterally. Moderate subarticular and left foraminal narrowing is similar to the prior study. L5-S1: Mild facet hypertrophy is present at L5 to S1. No focal disc protrusion or stenosis is present. IMPRESSION: 1. Progressed grade 1 anterolisthesis at L4-5. 2. New mild leftward disc protrusion at L1-2 without significant stenosis. 3. Progressed broad-based disc protrusion at L2-3 with moderate right and mild left subarticular stenosis and mild right foraminal narrowing. 4. Stable leftward broad-based disc protrusion at L3-4 with mild left subarticular and foraminal stenosis. 5. Moderate subarticular and left foraminal narrowing at L4-5, similar to prior study. Electronically signed by: Lonni Necessary MD 12/04/2023 03:40 PM EDT RP Workstation: [id number]   MR BRAIN WO CONTRAST Result Date: 12/04/2023 EXAM: MR Brain Without Intravenous Contrast.  CLINICAL HISTORY: Head trauma, minor (Age >= 65y). Pt from home and home health c/o left sided weakness. Pt states he can not urinate. Denies left sided weakness. Axox4. VSS. TECHNIQUE: Magnetic resonance images of the brain without intravenous contrast in multiple planes. CONTRAST: None. COMPARISON: CT head without contrast 12/01/2023. FINDINGS: BRAIN: Scattered foci of acute/subacute nonhemorrhagic infarcts are present in a right watershed distribution. Infarcts are present within the right precentral gyrus. More confluent areas of  infarction are present anteriorly in the right middle frontal gyrus and in the lateral right occipital lobe. A single focus of acute/subacute infarction is present anteriorly in the left frontal lobe. White matter changes extend into the brainstem. Remote lacunar infarcts are present within the basal ganglia and thalami bilaterally. Remote lacunar infarcts are present in the right cerebral cerebellar hemisphere. Susceptibility artifact are present in the anterior frontal lobes bilaterally. VENTRICLES: No hydrocephalus. ORBITS: CALL MEASURES CURRENT ER CALL MEASURES CURRENT EMERGENCY DEPARTMENT Bilateral lens replacements are noted. The globes and orbits are otherwise within normal limits. SINUSES AND MASTOIDS: Moderate mucosal thickening is present in the anterior ethmoid air cells bilaterally. The right frontal sinus is near completely opacified. The left frontal sinus is hypoplastic. A small right mastoid effusion is present. No obstructing nasopharyngeal lesion is present. BONES: No acute fracture or focal osseous lesion. IMPRESSION: 1. Scattered foci of acute/subacute nonhemorrhagic infarcts in a right watershed distribution, including the right precentral gyrus, right middle frontal gyrus, and lateral right occipital lobe. This suggests either right carotid stenosis or right-sided embolic disease 2. A single focus of acute/subacute infarction is present anteriorly in the left  frontal lobe. 3. Remote lacunar infarcts in the basal ganglia and thalami bilaterally, as well as in the right cerebral cerebellar hemisphere. 4. Moderate mucosal thickening in the anterior ethmoid air cells bilaterally and near complete opacification of the right frontal sinus. 5. Small right mastoid effusion. Findings were called to Dr. Ginger at 3:30pm Electronically signed by: Lonni Necessary MD 12/04/2023 03:34 PM EDT RP Workstation: [id number]     Procedures   Medications Ordered in the ED  sodium chloride  0.9 % bolus 500 mL (500 mLs Intravenous New Bag/Given 12/04/23 1531)                                    Medical Decision Making Amount and/or Complexity of Data Reviewed Labs: ordered. Radiology: ordered.  Risk Decision regarding hospitalization.    COURTNEY FENLON is a 89 y.o. male with a past medical history significant for hypertension, hyperlipidemia, GERD, dementia, COPD, anxiety, hypothyroidism, esophageal stricture, CKD, previous GI bleed, thoracic aortic aneurysm, and recent fall with head injury several days ago who presents with left leg weakness and urinary retention.  According to report from patient, he had a fall several days ago and hit his head.  He had imaging of his head and neck that was reassuring and his wound was repaired.  He said that he has had no other issues but since yesterday afternoon he has had weakness in his left leg and says he has not been able to urinate well today.  Denies dysuria or burning but reports he simply cannot urinate.  He denies significant back pain at this time and denies history of cauda equina or other significant back injury.  Denies any numbness in his legs but still has weakness in the left leg.  Denies arm weakness or facial weakness.  On exam, lungs clear.  Chest nontender.  Abdomen nontender.  Back completely nontender even in the lumbar and thoracic spine.  No hip tenderness.  Patient could not raise his left leg but he had  intact sensation.  Intact pulses.  Grip strength symmetric.  Symmetric smile although does have bruising on his left cheek and in his left orbit.  Otherwise pupils symmetric and reactive with normal extraocular.  Patient resting.  Denies any difficulty with dizziness  or speech troubles.  Patient says he thinks that he may have had a stroke given the left leg weakness that is new.  With this new weakness and recent head injury, will get MRI of his brain however with his addition of now urinary retention and the leg symptoms, we will also get imaging of his lumbar spine to make sure there is not cauda equina although he has no pain which is somewhat perplexing if that is the etiology.  Anticipate follow-up on his workup after MRIs of his head and lumbar spine and labs.  2:22 PM Family arrived and was able to provide more information.  They say that he was diagnosed with COVID several days ago and has been more tired and getting less intake.  They are worried about his left leg but they do say that he has had left leg issues for about a year or so.  I think it is progressed with his known poor circulation in the left leg.  On my reassessment he does have palpable pulses for me and has intact sensation and he now is able to lift his leg which is improved.  With his new urinary retention, AKI on his labs, recent COVID positivity, and soft pressures I do feel needs admission.  He will need admission after his MRIs for rehydration, AKI management, COVID in the setting of generalized weakness and fatigue and this transient left leg worsened weakness leading to falls.  Suspect the left leg workup can be further continued as an outpatient as he did have intact sensation, had improved strength, and had a palpable pulse for me.  Patient's AKI does not allow for CTA or runoff of the legs which I do not think he needs at this time either.  He will need admission after his MRIs are completed.  3:28 PM I looked at  the MRI images myself and am concerned about acute watershed stroke on the right side and some possible other ischemic stroke on the left side.  I called radiology who agrees.  Do not see any hemorrhage initially.  Radiology agrees that due to the watershed there is concern for some upstream flow problems so we will order MRA of the head and neck as he cannot get CT contrast due to his AKI and low GFR.  Will order the MRA of the head and neck and he will need admission to medicine.  MRI of the lumbar spine did not show evidence of acute stroke or cauda equina and this lumbar spine. some chronic changes were seen with some stenosis but no critical finding per radiology on the phone.  Will call neurology and medicine for admit.        Final diagnoses:  Urinary retention  AKI (acute kidney injury) (HCC)  Dehydration  Transient weakness of left leg  History of recent fall  COVID-19  Fatigue, unspecified type  Cerebrovascular accident (CVA), unspecified mechanism (HCC)     Clinical Impression: 1. Urinary retention   2. AKI (acute kidney injury) (HCC)   3. Dehydration   4. Transient weakness of left leg   5. History of recent fall   6. COVID-19   7. Fatigue, unspecified type   8. Cerebrovascular accident (CVA), unspecified mechanism (HCC)     Disposition: Admit  This note was prepared with assistance of Dragon voice recognition software. Occasional wrong-word or sound-a-like substitutions may have occurred due to the inherent limitations of voice recognition software.     Laiah Pouncey, Lonni PARAS, MD  12/04/23 1544  

## 2023-12-04 NOTE — ED Notes (Signed)
 Pt currently in MRI

## 2023-12-04 NOTE — Plan of Care (Signed)
 Problem: Education: Goal: Knowledge of disease or condition will improve 12/04/2023 2153 by Valorie Inocente NOVAK, RN Outcome: Progressing 12/04/2023 1950 by Valorie Inocente NOVAK, RN Outcome: Progressing Goal: Knowledge of secondary prevention will improve (MUST DOCUMENT ALL) 12/04/2023 2153 by Valorie Inocente NOVAK, RN Outcome: Progressing 12/04/2023 1950 by Valorie Inocente NOVAK, RN Outcome: Progressing Goal: Knowledge of patient specific risk factors will improve (DELETE if not current risk factor) 12/04/2023 2153 by Valorie Inocente NOVAK, RN Outcome: Progressing 12/04/2023 1950 by Valorie Inocente NOVAK, RN Outcome: Progressing   Problem: Ischemic Stroke/TIA Tissue Perfusion: Goal: Complications of ischemic stroke/TIA will be minimized 12/04/2023 2153 by Valorie Inocente NOVAK, RN Outcome: Progressing 12/04/2023 1950 by Valorie Inocente NOVAK, RN Outcome: Progressing   Problem: Coping: Goal: Will verbalize positive feelings about self 12/04/2023 2153 by Valorie Inocente NOVAK, RN Outcome: Progressing 12/04/2023 1950 by Valorie Inocente NOVAK, RN Outcome: Progressing Goal: Will identify appropriate support needs 12/04/2023 2153 by Valorie Inocente NOVAK, RN Outcome: Progressing 12/04/2023 1950 by Valorie Inocente NOVAK, RN Outcome: Progressing   Problem: Health Behavior/Discharge Planning: Goal: Ability to manage health-related needs will improve 12/04/2023 2153 by Valorie Inocente NOVAK, RN Outcome: Progressing 12/04/2023 1950 by Valorie Inocente NOVAK, RN Outcome: Progressing Goal: Goals will be collaboratively established with patient/family 12/04/2023 2153 by Valorie Inocente NOVAK, RN Outcome: Progressing 12/04/2023 1950 by Valorie Inocente NOVAK, RN Outcome: Progressing   Problem: Self-Care: Goal: Ability to participate in self-care as condition permits will improve 12/04/2023 2153 by Valorie Inocente NOVAK, RN Outcome: Progressing 12/04/2023 1950 by Valorie Inocente NOVAK, RN Outcome: Progressing Goal: Verbalization of feelings and concerns over difficulty with self-care will  improve 12/04/2023 2153 by Valorie Inocente NOVAK, RN Outcome: Progressing 12/04/2023 1950 by Valorie Inocente NOVAK, RN Outcome: Progressing Goal: Ability to communicate needs accurately will improve 12/04/2023 2153 by Valorie Inocente NOVAK, RN Outcome: Progressing 12/04/2023 1950 by Valorie Inocente NOVAK, RN Outcome: Progressing   Problem: Nutrition: Goal: Risk of aspiration will decrease 12/04/2023 2153 by Valorie Inocente NOVAK, RN Outcome: Progressing 12/04/2023 1950 by Valorie Inocente NOVAK, RN Outcome: Progressing Goal: Dietary intake will improve 12/04/2023 2153 by Valorie Inocente NOVAK, RN Outcome: Progressing 12/04/2023 1950 by Valorie Inocente NOVAK, RN Outcome: Progressing   Problem: Education: Goal: Knowledge of General Education information will improve Description: Including pain rating scale, medication(s)/side effects and non-pharmacologic comfort measures 12/04/2023 2153 by Valorie Inocente NOVAK, RN Outcome: Progressing 12/04/2023 1950 by Valorie Inocente NOVAK, RN Outcome: Progressing   Problem: Health Behavior/Discharge Planning: Goal: Ability to manage health-related needs will improve 12/04/2023 2153 by Valorie Inocente NOVAK, RN Outcome: Progressing 12/04/2023 1950 by Valorie Inocente NOVAK, RN Outcome: Progressing   Problem: Clinical Measurements: Goal: Ability to maintain clinical measurements within normal limits will improve 12/04/2023 2153 by Valorie Inocente NOVAK, RN Outcome: Progressing 12/04/2023 1950 by Valorie Inocente NOVAK, RN Outcome: Progressing Goal: Will remain free from infection 12/04/2023 2153 by Valorie Inocente NOVAK, RN Outcome: Progressing 12/04/2023 1950 by Valorie Inocente NOVAK, RN Outcome: Progressing Goal: Diagnostic test results will improve 12/04/2023 2153 by Valorie Inocente NOVAK, RN Outcome: Progressing 12/04/2023 1950 by Valorie Inocente NOVAK, RN Outcome: Progressing Goal: Respiratory complications will improve 12/04/2023 2153 by Valorie Inocente NOVAK, RN Outcome: Progressing 12/04/2023 1950 by Valorie Inocente NOVAK, RN Outcome: Progressing Goal:  Cardiovascular complication will be avoided 12/04/2023 2153 by Valorie Inocente NOVAK, RN Outcome: Progressing 12/04/2023 1950 by Valorie Inocente NOVAK, RN Outcome: Progressing   Problem: Activity: Goal: Risk for activity intolerance will decrease 12/04/2023 2153 by Valorie Inocente NOVAK, RN Outcome: Progressing 12/04/2023 1950 by  Valorie Inocente NOVAK, RN Outcome: Progressing   Problem: Nutrition: Goal: Adequate nutrition will be maintained 12/04/2023 2153 by Valorie Inocente NOVAK, RN Outcome: Progressing 12/04/2023 1950 by Valorie Inocente NOVAK, RN Outcome: Progressing   Problem: Coping: Goal: Level of anxiety will decrease 12/04/2023 2153 by Valorie Inocente NOVAK, RN Outcome: Progressing 12/04/2023 1950 by Valorie Inocente NOVAK, RN Outcome: Progressing   Problem: Elimination: Goal: Will not experience complications related to bowel motility 12/04/2023 2153 by Valorie Inocente NOVAK, RN Outcome: Progressing 12/04/2023 1950 by Valorie Inocente NOVAK, RN Outcome: Progressing Goal: Will not experience complications related to urinary retention 12/04/2023 2153 by Valorie Inocente NOVAK, RN Outcome: Progressing 12/04/2023 1950 by Valorie Inocente NOVAK, RN Outcome: Progressing   Problem: Pain Managment: Goal: General experience of comfort will improve and/or be controlled 12/04/2023 2153 by Valorie Inocente NOVAK, RN Outcome: Progressing 12/04/2023 1950 by Valorie Inocente NOVAK, RN Outcome: Progressing   Problem: Safety: Goal: Ability to remain free from injury will improve 12/04/2023 2153 by Valorie Inocente NOVAK, RN Outcome: Progressing 12/04/2023 1950 by Valorie Inocente NOVAK, RN Outcome: Progressing   Problem: Skin Integrity: Goal: Risk for impaired skin integrity will decrease 12/04/2023 2153 by Valorie Inocente NOVAK, RN Outcome: Progressing 12/04/2023 1950 by Valorie Inocente NOVAK, RN Outcome: Progressing

## 2023-12-05 ENCOUNTER — Inpatient Hospital Stay (HOSPITAL_COMMUNITY)

## 2023-12-05 ENCOUNTER — Encounter (HOSPITAL_COMMUNITY): Payer: Self-pay | Admitting: Hospitalist

## 2023-12-05 ENCOUNTER — Ambulatory Visit: Payer: No Typology Code available for payment source | Admitting: Internal Medicine

## 2023-12-05 DIAGNOSIS — I63532 Cerebral infarction due to unspecified occlusion or stenosis of left posterior cerebral artery: Secondary | ICD-10-CM | POA: Diagnosis not present

## 2023-12-05 DIAGNOSIS — I6389 Other cerebral infarction: Secondary | ICD-10-CM

## 2023-12-05 DIAGNOSIS — I63522 Cerebral infarction due to unspecified occlusion or stenosis of left anterior cerebral artery: Secondary | ICD-10-CM | POA: Diagnosis not present

## 2023-12-05 DIAGNOSIS — I6521 Occlusion and stenosis of right carotid artery: Secondary | ICD-10-CM

## 2023-12-05 DIAGNOSIS — R29704 NIHSS score 4: Secondary | ICD-10-CM | POA: Diagnosis not present

## 2023-12-05 DIAGNOSIS — I639 Cerebral infarction, unspecified: Secondary | ICD-10-CM | POA: Diagnosis not present

## 2023-12-05 DIAGNOSIS — I63511 Cerebral infarction due to unspecified occlusion or stenosis of right middle cerebral artery: Secondary | ICD-10-CM | POA: Diagnosis not present

## 2023-12-05 DIAGNOSIS — Z87891 Personal history of nicotine dependence: Secondary | ICD-10-CM

## 2023-12-05 DIAGNOSIS — E785 Hyperlipidemia, unspecified: Secondary | ICD-10-CM

## 2023-12-05 LAB — LIPID PANEL
Cholesterol: 119 mg/dL (ref 0–200)
HDL: 55 mg/dL (ref >40)
LDL Cholesterol: 49 mg/dL (ref 0–99)
Total CHOL/HDL Ratio: 2.2 ratio
Triglycerides: 76 mg/dL (ref <150)
VLDL: 15 mg/dL (ref 0–40)

## 2023-12-05 LAB — HEMOGLOBIN A1C
Hgb A1c MFr Bld: 6.3 % — ABNORMAL HIGH (ref 4.8–5.6)
Mean Plasma Glucose: 134.11 mg/dL

## 2023-12-05 LAB — ECHOCARDIOGRAM COMPLETE
AR max vel: 2.69 cm2
AV Area VTI: 3.09 cm2
AV Area mean vel: 2.86 cm2
AV Mean grad: 5 mmHg
AV Peak grad: 10.4 mmHg
Ao pk vel: 1.61 m/s
Area-P 1/2: 3.48 cm2
Height: 72 in
MV VTI: 2.35 cm2
S' Lateral: 2.45 cm
Weight: 2944 [oz_av]

## 2023-12-05 MED ORDER — TRAZODONE HCL 50 MG PO TABS
25.0000 mg | ORAL_TABLET | Freq: Once | ORAL | Status: AC | PRN
  Administered 2023-12-05: 25 mg via ORAL
  Filled 2023-12-05: qty 1

## 2023-12-05 MED ORDER — DICLOFENAC SODIUM 1 % EX GEL
2.0000 g | Freq: Four times a day (QID) | CUTANEOUS | Status: DC
  Administered 2023-12-05 – 2023-12-08 (×10): 2 g via TOPICAL
  Filled 2023-12-05: qty 100

## 2023-12-05 MED ORDER — TRAZODONE HCL 50 MG PO TABS
25.0000 mg | ORAL_TABLET | Freq: Every evening | ORAL | Status: DC | PRN
Start: 2023-12-05 — End: 2023-12-05

## 2023-12-05 MED ORDER — SODIUM CHLORIDE 0.9 % IV SOLN: INTRAVENOUS | Status: AC

## 2023-12-05 NOTE — [place] FL2 (Signed)
 [place]  MEDICAID FL2 LEVEL OF CARE FORM     IDENTIFICATION  Patient Name: Stephen Wilkins Birthdate: 1928-01-02 Sex: male Admission Date (Current Location): 12/04/2023  [facility] and [place] Number:  Producer, television/film/video and Address:  [facility], [address], [place], [place] [address]      Provider Number: [id number]  Attending Physician Name and Address:  [name] Ranks, MD  Relative Name and Phone Number:       Current Level of Care: Hospital Recommended Level of Care: Skilled Nursing Facility Prior Approval Number:    Date Approved/Denied:   PASRR Number: [id number]  Discharge Plan: SNF    Current Diagnoses: Patient Active Problem List   Diagnosis Date Noted   Stroke (HCC) 12/04/2023   Productive cough 11/22/2023   Recurrent falls 11/22/2023   CKD (chronic kidney disease) stage 4, GFR 15-29 ml/min (HCC) 11/22/2023   Unqualified visual loss, both eyes 06/01/2023   PAD (peripheral artery disease) (HCC) 06/01/2023   Vertigo 11/29/2022   History of GI diverticular bleed 11/13/2022   History of colonic polyps 11/13/2022   Lower GI bleeding 11/12/2022   Lower GI bleed 11/11/2022   Hematochezia 11/11/2022   Unintentional weight loss 11/11/2022   Chronic constipation 11/11/2022   Acute on chronic anemia 11/11/2022   Acute GI bleeding 11/11/2022   OAB (overactive bladder) 11/09/2022   Lesion of ear 11/09/2022   Epithelial (juvenile) corneal dystrophy, 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   Shortness of breath 08/02/2022   Thoracic aortic aneurysm without rupture (HCC) 08/02/2022   Asthmatic bronchitis 08/02/2022   Acute hypoxemic respiratory failure (HCC) 06/28/2022   Severe persistent asthma with exacerbation 06/05/2022   Unspecified corneal edema 06/02/2022   Asthma 04/25/2022   Benign prostatic hyperplasia without urinary obstruction 04/25/2022   Hearing loss 04/25/2022   HTN (hypertension) 04/25/2022   Mixed hyperlipidemia 04/25/2022   Encounter  for fitting and adjustment of hearing aid 04/25/2022   Encounter for well adult exam with abnormal findings 04/25/2022   Urinary frequency 10/24/2021   Heart murmur 10/24/2021   Low energy 10/21/2021   Vitamin D  deficiency 04/16/2021   Pain in joint of left shoulder 01/23/2020   Bilateral shoulder pain 11/22/2019   Myalgia 11/22/2019   Gait disorder 11/22/2019   Chronic pain of right thumb 05/17/2019   Primary osteoarthritis of first carpometacarpal joint of right hand 05/17/2019   Trigger finger of right thumb 05/17/2019   Corneal epithelial basement membrane dystrophy 03/04/2019   Keratoconjunctivitis sicca of both eyes not specified as Sjogren's 03/04/2019   Posterior vitreous detachment of both eyes 03/04/2019   Presbyopia of both eyes 03/04/2019   Pseudophakia of both eyes 03/04/2019   Febrile illness 12/27/2018   Choledocholithiasis 12/27/2018   Low back pain 07/11/2017   Non compliance w medication regimen 08/23/2016   Peripheral neuropathy 08/23/2016   COPD (chronic obstructive pulmonary disease) (HCC) 04/19/2016   Hyperglycemia 02/10/2016   Preventative health care 07/29/2015   CAP (community acquired pneumonia) 05/30/2015   Abnormal CXR 05/30/2015   Cough 04/22/2015   Asthma with exacerbation 04/22/2015   Urine abnormality 04/22/2015   Abdominal pain 09/11/2014   Abnormal liver function test 09/11/2014   Dysphagia 07/29/2014   Anxiety state 09/06/2013   CKD (chronic kidney disease) 09/06/2013   Hypothyroidism 09/06/2013   Rash and nonspecific skin eruption 02/27/2013   Dizziness and giddiness 01/04/2013   Abnormal TSH 06/04/2012   Gross hematuria 01/09/2012   Bruising 06/05/2011   Dementia (HCC) 09/01/2010  Cough variant asthma vs UACS  01/14/2010   ERECTILE DYSFUNCTION, ORGANIC 04/17/2009   FATIGUE 06/27/2008   PSA, INCREASED 06/27/2008   SCIATICA, RIGHT 04/28/2008   SCHATZKI'S RING 07/18/2007   Diaphragmatic hernia 07/18/2007   Schatzki's ring 07/18/2007    ABNORMAL ELECTROCARDIOGRAM 06/21/2007   Hyperlipidemia 03/23/2007   Essential hypertension 03/23/2007   Allergic rhinitis 03/23/2007   GERD 03/23/2007   BPH associated with nocturia 03/23/2007   NEPHROLITHIASIS, HX OF 03/23/2007    Orientation RESPIRATION BLADDER Height & Weight     Self  Normal Incontinent Weight: 184 lb (83.5 kg) Height:  6' (182.9 cm)  BEHAVIORAL SYMPTOMS/MOOD NEUROLOGICAL BOWEL NUTRITION STATUS      Continent Diet (heart healthy)  AMBULATORY STATUS COMMUNICATION OF NEEDS Skin   Extensive Assist Verbally Normal                       Personal Care Assistance Level of Assistance  Bathing, Feeding, Dressing Bathing Assistance: Maximum assistance Feeding assistance: Limited assistance Dressing Assistance: Maximum assistance     Functional Limitations Info  Hearing   Hearing Info: Impaired      SPECIAL CARE FACTORS FREQUENCY  PT (By licensed PT), OT (By licensed OT)     PT Frequency: 5x/wk OT Frequency: 5x/wk            Contractures Contractures Info: Not present    Additional Factors Info  Code Status, Allergies Code Status Info: DNR Allergies Info: Atorvastatin , Doxazosin Mesylate, Hydrocodone  Bit-homatrop Mbr, Hydrocodone            Current Medications (12/05/2023):  This is the current hospital active medication list Current Facility-Administered Medications  Medication Dose Route Frequency Provider Last Rate Last Admin   0.9 %  sodium chloride  infusion   Intravenous Continuous [name] Ranks, MD 100 mL/hr at 12/05/23 1057 New Bag at 12/05/23 1057   acetaminophen  (TYLENOL ) tablet 650 mg  650 mg Oral Q4H PRN Georgina Basket, MD       Or   acetaminophen  (TYLENOL ) 160 MG/5ML solution 650 mg  650 mg Per Tube Q4H PRN Georgina Basket, MD       Or   acetaminophen  (TYLENOL ) suppository 650 mg  650 mg Rectal Q4H PRN Georgina Basket, MD       albuterol  (PROVENTIL ) (2.5 MG/3ML) 0.083% nebulizer solution 2.5 mg  2.5 mg Nebulization Q4H PRN Moore,  Willie, MD   2.5 mg at 12/05/23 1126   aspirin  EC tablet 81 mg  81 mg Oral Daily Georgina Basket, MD   81 mg at 12/05/23 1054   budesonide -glycopyrrolate -formoterol  (BREZTRI ) 160-9-4.8 MCG/ACT inhaler 2 puff  2 puff Inhalation BID Georgina Basket, MD   2 puff at 12/05/23 1055   Chlorhexidine  Gluconate Cloth 2 % PADS 6 each  6 each Topical Q0600 Georgina Basket, MD   6 each at 12/05/23 1059   clopidogrel  (PLAVIX ) tablet 75 mg  75 mg Oral Daily de Clint Kill, Cortney E, NP   75 mg at 12/05/23 1054   pantoprazole  (PROTONIX ) EC tablet 40 mg  40 mg Oral Daily Georgina Basket, MD   40 mg at 12/05/23 1054   rosuvastatin  (CRESTOR ) tablet 10 mg  10 mg Oral Daily Georgina Basket, MD   10 mg at 12/05/23 1054   senna-docusate (Senokot-S) tablet 1 tablet  1 tablet Oral QHS Georgina Basket, MD       tamsulosin  (FLOMAX ) capsule 0.4 mg  0.4 mg Oral QPC supper Georgina Basket, MD  Discharge Medications: Please see discharge summary for a list of discharge medications.  Relevant Imaging Results:  Relevant Lab Results:   Additional Information SS#: [id number]  Almarie CHRISTELLA Goodie, LCSW

## 2023-12-05 NOTE — Progress Notes (Signed)
 Refused vitals

## 2023-12-05 NOTE — Progress Notes (Signed)
 Carotid duplex  has been completed. Refer to [facility] under chart review to view preliminary results.   12/05/2023  12:19 PM Cori Henningsen, Ricka BIRCH

## 2023-12-05 NOTE — TOC Initial Note (Signed)
 Transition of Care [facility]) - Initial/Assessment Note    Patient Details  Name: Stephen Wilkins MRN: [id number] Date of Birth: 08-25-1927  Transition [facility] CM/SW Contact:    Almarie CHRISTELLA Goodie, LCSW Phone Number: 12/05/2023, 1:44 PM  Clinical Narrative:    CSW met with patient's daughter, Stephen Wilkins, at bedside to discuss recommendation for SNF. Family in agreement, preference for [facility], as the patient's spoue has been there before. [facility] can offer the patient a bed. CSW contacted [employer] to initiate insurance authorization for SNF and PTAR. CSW to follow.               Expected Discharge Plan: Skilled Nursing Facility Barriers to Discharge: Continued Medical Work up, English as a second language teacher   Patient Goals and CMS Choice Patient states their goals for this hospitalization and ongoing recovery are:: patient unable to participate in goal setting, not fully oriented CMS Medicare.gov Compare Post Acute Care list provided to:: Patient Represenative (must comment) Choice offered to / list presented to : Adult Children [place] ownership interest in [facility].provided to:: Adult Children    Expected Discharge Plan and Services     Post Acute Care Choice: Skilled Nursing Facility Living arrangements for the past 2 months: Single Family Home                                      Prior Living Arrangements/Services Living arrangements for the past 2 months: Single Family Home Lives with:: Spouse Patient language and need for interpreter reviewed:: No Do you feel safe going back to the place where you live?: Yes      Need for Family Participation in Patient Care: Yes (Comment) Care giver support system in place?: No (comment) Current home services: DME, [facility] Criminal Activity/Legal Involvement Pertinent to Current Situation/Hospitalization: No - Comment as needed  Activities of Daily Living   ADL Screening (condition at time of  admission) Independently performs ADLs?: Yes (appropriate for developmental age) Is the patient deaf or have difficulty hearing?: Yes Does the patient have difficulty seeing, even when wearing glasses/contacts?: Yes (partial left eye vision loss) Does the patient have difficulty concentrating, remembering, or making decisions?: Yes  Permission Sought/Granted Permission sought to share information with : Facility Medical sales representative, Family Supports Permission granted to share information with : Yes, Verbal Permission Granted  Share Information with NAME: Stephen Wilkins, Shirley, Robert  Permission granted to share info w AGENCY: SNF  Permission granted to share info w Relationship: Daughter, Spouse, Son     Emotional Assessment Appearance:: Appears stated age Attitude/Demeanor/Rapport: Engaged Affect (typically observed): Appropriate Orientation: : Oriented to Self Alcohol / Substance Use: Not Applicable Psych Involvement: No (comment)  Admission diagnosis:  Dehydration [E86.0] Urinary retention [R33.9] Stroke (HCC) [I63.9] AKI (acute kidney injury) (HCC) [N17.9] Transient weakness of left leg [R29.898] History of recent fall [Z91.81] Fatigue, unspecified type [R53.83] Cerebrovascular accident (CVA), unspecified mechanism (HCC) [I63.9] COVID-19 [U07.1] Patient Active Problem List   Diagnosis Date Noted   Stroke (HCC) 12/04/2023   Productive cough 11/22/2023   Recurrent falls 11/22/2023   CKD (chronic kidney disease) stage 4, GFR 15-29 ml/min (HCC) 11/22/2023   Unqualified visual loss, both eyes 06/01/2023   PAD (peripheral artery disease) (HCC) 06/01/2023   Vertigo 11/29/2022   History of GI diverticular bleed 11/13/2022   History of colonic polyps 11/13/2022   Lower GI bleeding 11/12/2022   Lower GI bleed 11/11/2022  Hematochezia 11/11/2022   Unintentional weight loss 11/11/2022   Chronic constipation 11/11/2022   Acute on chronic anemia 11/11/2022   Acute GI bleeding  11/11/2022   OAB (overactive bladder) 11/09/2022   Lesion of ear 11/09/2022   Epithelial (juvenile) corneal dystrophy, 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   Shortness of breath 08/02/2022   Thoracic aortic aneurysm without rupture (HCC) 08/02/2022   Asthmatic bronchitis 08/02/2022   Acute hypoxemic respiratory failure (HCC) 06/28/2022   Severe persistent asthma with exacerbation 06/05/2022   Unspecified corneal edema 06/02/2022   Asthma 04/25/2022   Benign prostatic hyperplasia without urinary obstruction 04/25/2022   Hearing loss 04/25/2022   HTN (hypertension) 04/25/2022   Mixed hyperlipidemia 04/25/2022   Encounter for fitting and adjustment of hearing aid 04/25/2022   Encounter for well adult exam with abnormal findings 04/25/2022   Urinary frequency 10/24/2021   Heart murmur 10/24/2021   Low energy 10/21/2021   Vitamin D  deficiency 04/16/2021   Pain in joint of left shoulder 01/23/2020   Bilateral shoulder pain 11/22/2019   Myalgia 11/22/2019   Gait disorder 11/22/2019   Chronic pain of right thumb 05/17/2019   Primary osteoarthritis of first carpometacarpal joint of right hand 05/17/2019   Trigger finger of right thumb 05/17/2019   Corneal epithelial basement membrane dystrophy 03/04/2019   Keratoconjunctivitis sicca of both eyes not specified as Sjogren's 03/04/2019   Posterior vitreous detachment of both eyes 03/04/2019   Presbyopia of both eyes 03/04/2019   Pseudophakia of both eyes 03/04/2019   Febrile illness 12/27/2018   Choledocholithiasis 12/27/2018   Low back pain 07/11/2017   Non compliance w medication regimen 08/23/2016   Peripheral neuropathy 08/23/2016   COPD (chronic obstructive pulmonary disease) (HCC) 04/19/2016   Hyperglycemia 02/10/2016   Preventative health care 07/29/2015   CAP (community acquired pneumonia) 05/30/2015   Abnormal CXR 05/30/2015   Cough 04/22/2015   Asthma with exacerbation 04/22/2015   Urine abnormality 04/22/2015   Abdominal pain 09/11/2014    Abnormal liver function test 09/11/2014   Dysphagia 07/29/2014   Anxiety state 09/06/2013   CKD (chronic kidney disease) 09/06/2013   Hypothyroidism 09/06/2013   Rash and nonspecific skin eruption 02/27/2013   Dizziness and giddiness 01/04/2013   Abnormal TSH 06/04/2012   Gross hematuria 01/09/2012   Bruising 06/05/2011   Dementia (HCC) 09/01/2010   Cough variant asthma vs UACS  01/14/2010   ERECTILE DYSFUNCTION, ORGANIC 04/17/2009   FATIGUE 06/27/2008   PSA, INCREASED 06/27/2008   SCIATICA, RIGHT 04/28/2008   SCHATZKI'S RING 07/18/2007   Diaphragmatic hernia 07/18/2007   Schatzki's ring 07/18/2007   ABNORMAL ELECTROCARDIOGRAM 06/21/2007   Hyperlipidemia 03/23/2007   Essential hypertension 03/23/2007   Allergic rhinitis 03/23/2007   GERD 03/23/2007   BPH associated with nocturia 03/23/2007   NEPHROLITHIASIS, HX OF 03/23/2007   PCP:  Norleen Lynwood ORN, MD Pharmacy:   [facility] [address], [place] - [address] [place] [address] Phone: [phone] Fax: [phone]     Social Drivers of Health (SDOH) Social History: SDOH Screenings   Food Insecurity: No Food Insecurity (12/04/2023)  Housing: Low Risk  (12/04/2023)  Transportation Needs: No Transportation Needs (12/04/2023)  Utilities: Not At Risk (12/04/2023)  Alcohol Screen: Low Risk  (10/02/2023)  Depression (PHQ2-9): Low Risk  (10/02/2023)  Financial Resource Strain: Low Risk  (10/02/2023)  Physical Activity: Inactive (10/02/2023)  Social Connections: Moderately Isolated (12/04/2023)  Stress: No Stress Concern Present (10/02/2023)  Tobacco Use: Medium Risk (12/05/2023)  Health Literacy: Adequate Health  Literacy (10/02/2023)   SDOH Interventions:     Readmission Risk Interventions    11/13/2022   10:22 AM  Readmission Risk Prevention Plan  Transportation Screening Complete  PCP or Specialist Appt within 5-7 Days Complete  Home Care Screening Complete  Medication Review (RN  CM) Complete

## 2023-12-05 NOTE — Progress Notes (Signed)
 PROGRESS NOTE    Stephen Wilkins  FMW:[id number] DOB: 11-Mar-1928 DOA: 12/04/2023 PCP: Norleen Lynwood ORN, MD   Brief Narrative:  Stephen Wilkins is a 89 y.o. male with medical history significant of dementia, COPD, BPH, esophageal stricture, HTN, HLD, and diverticulosis presented with weakness in the left leg and inability to urinate.   The patient experienced a fall on Friday afternoon, resulting in a gash on the head and a black eye. Following the fall, the patient was transported to [facility], where a CT scan was performed and pt eventually discharged home the same day. Upon returning home, the pt was unable to ambulate or move LLE; thus, he re-presented to the ED and MRI revealed the presence of a stroke.  Patient was also found to have AKI on CKD.  Admitted to hospitalist service, neurology consulted.  Details below.  Assessment & Plan:   Principal Problem:   Stroke [address] [facility])  Acute ischemic CVA: MRI brain shows scattered foci of acute/subacute nonhemorrhagic infarct in the right watershed distribution, including right precentral gyrus, right middle frontal gyrus and lateral right occipital lobe.  Also has infarct in the left frontal lobe and remote lacunar infarcts.  MRA neck shows 70% plus stenosis of the right ICA.  No intracranial stenosis.  Patient was seen by neurology, started on DAPT aspirin  and Plavix .  May need vascular intervention.  Doppler carotid has been ordered by neurology and pending.  Echo is pending.  PT OT assessment is pending.  LDL only 49 but he remains on Crestor .  Essential hypertension: PTA medication include losartan .  Need to allow permissive hypertension as low blood pressure may cause further worsening of the stroke.  However patient's blood pressure is within normal range despite of holding antihypertensives.  AKI on CKD stage IIIb: Baseline creatinine around 1.7, presented with 2.41 and now worse to 3.02.  I will start him on IV fluids for another 20 hours and repeat labs in the  morning.  BPH/urinary retention: Initially presented with urinary retention, he is now voiding.  Continue Flomax .  Prediabetes: Hemoglobin A1c only 6.3.  Does not require any SSI.  DVT prophylaxis: SCDs Start: 12/04/23 1606   Code Status: Limited: Do not attempt resuscitation (DNR) -DNR-LIMITED -Do Not Intubate/DNI   Family Communication: Daughter present at bedside.  Plan of care discussed with patient in length and he/she verbalized understanding and agreed with it.  Status is: Inpatient Remains inpatient appropriate because: AKI, needs fluids.  Further neurowork-up is also pending.  May need vascular surgery consult.   Estimated body mass index is 24.95 kg/m as calculated from the following:   Height as of this encounter: 6' (1.829 m).   Weight as of this encounter: 83.5 kg.    Nutritional Assessment: Body mass index is 24.95 kg/m.SABRA Seen by dietician.  I agree with the assessment and plan as outlined below: Nutrition Status:        . Skin Assessment: I have examined the patient's skin and I agree with the wound assessment as performed by the wound care RN as outlined below:    Consultants:  Neurology  Procedures:  None  Antimicrobials:  Anti-infectives (From admission, onward)    None         Subjective: Seen and examined, sitting in the chair, fully alert and oriented.  He says that his left lower extremity weakness is getting better.  He has no other complaint.  His daughter was at the bedside.  Objective: Vitals:   12/05/23 0000  12/05/23 0200 12/05/23 0400 12/05/23 0736  BP:    122/86  Pulse:    86  Resp: 16 16 17 16   Temp:    98.4 F (36.9 C)  TempSrc:    Oral  SpO2:    99%  Weight:      Height:        Intake/Output Summary (Last 24 hours) at 12/05/2023 0835 Last data filed at 12/05/2023 0000 Gross per 24 hour  Intake 636 ml  Output 1050 ml  Net -414 ml   Filed Weights   12/04/23 1139  Weight: 83.5 kg    Examination:  General exam:  Appears calm and comfortable, has bruise in left periorbital area Respiratory system: Clear to auscultation. Respiratory effort normal. Cardiovascular system: S1 & S2 heard, RRR. No JVD, murmurs, rubs, gallops or clicks. No pedal edema. Gastrointestinal system: Abdomen is nondistended, soft and nontender. No organomegaly or masses felt. Normal bowel sounds heard. Central nervous system: Alert and oriented.  Slight weakness in the left lower extremity.  No other focal deficit. Extremities: Symmetric 5 x 5 power. Skin: No rashes, lesions or ulcers Psychiatry: Judgement and insight appear normal. Mood & affect appropriate.    Data Reviewed: I have personally reviewed following labs and imaging studies  CBC: Recent Labs  Lab 12/04/23 1256  WBC 11.3*  NEUTROABS 8.2*  HGB 10.8*  HCT 33.8*  MCV 99.4  PLT 207   Basic Metabolic Panel: Recent Labs  Lab 12/04/23 1256  NA 137  K 4.9  CL 102  CO2 25  GLUCOSE 101*  BUN 58*  CREATININE 3.02*  CALCIUM  8.7*   GFR: Estimated Creatinine Clearance: 16.8 mL/min (A) (by C-G formula based on SCr of 3.02 mg/dL (H)). Liver Function Tests: Recent Labs  Lab 12/04/23 1256  AST 18  ALT 13  ALKPHOS 50  BILITOT 1.0  PROT 5.4*  ALBUMIN  2.9*   No results for input(s): LIPASE, AMYLASE in the last 168 hours. No results for input(s): AMMONIA in the last 168 hours. Coagulation Profile: No results for input(s): INR, PROTIME in the last 168 hours. Cardiac Enzymes: No results for input(s): CKTOTAL, CKMB, CKMBINDEX, TROPONINI in the last 168 hours. BNP (last 3 results) No results for input(s): PROBNP in the last 8760 hours. HbA1C: Recent Labs    12/05/23 0435  HGBA1C 6.3*   CBG: No results for input(s): GLUCAP in the last 168 hours. Lipid Profile: Recent Labs    12/05/23 0435  CHOL 119  HDL 55  LDLCALC 49  TRIG 76  CHOLHDL 2.2   Thyroid  Function Tests: No results for input(s): TSH, T4TOTAL, FREET4,  T3FREE, THYROIDAB in the last 72 hours. Anemia Panel: No results for input(s): VITAMINB12, FOLATE, FERRITIN, TIBC, IRON, RETICCTPCT in the last 72 hours. Sepsis Labs: No results for input(s): PROCALCITON, LATICACIDVEN in the last 168 hours.  No results found for this or any previous visit (from the past 240 hours).   Radiology Studies: MR Angiogram Neck W or Wo Contrast Result Date: 12/04/2023 EXAM: MRA Neck without and with contrast 12/04/2023 05:26:26 PM TECHNIQUE: Multiplanar multisequence MRA of the neck was performed without and with the administration of intravenous contrast. 2D and 3D reformatted images are provided for review. Stenosis of the internal carotid arteries is measured using NASCET criteria. COMPARISON: None available CLINICAL HISTORY: Stroke/TIA, determine embolic source; Concern for watershed stroke. Radiology recommended MRA head and neck due to AKI. FINDINGS: CAROTID ARTERIES: Some slight images demonstrate asymmetric flow disturbance in the proximal right internal  carotid artery. Irregular posterior atherosclerotic plaque is present along the proximal right ICA. 70 plus percent stenosis is present. No significant stenosis is present at the left ICA. Mild irregularity is present in the cavernous internal carotid arteries bilaterally. VERTEBRAL ARTERIES: Moderate focal stenosis is present in the distal left vertebral artery at the vertebral basilar junction. No dissection. IMPRESSION: 1. Irregular posterior atherosclerotic plaque in the proximal right ICA with 70+% stenosis relative to the more distal vessel . 2. Moderate focal stenosis in the distal left vertebral artery at the vertebrobasilar junction. 3. Mild irregularity in the cavernous internal carotid arteries bilaterally. Electronically signed by: Lonni Necessary MD 12/04/2023 05:42 PM EDT RP Workstation: [id number]   MR ANGIO HEAD WO CONTRAST Result Date: 12/04/2023 EXAM: MR Angiography Head without  intravenous Contrast. 12/04/2023 05:26:26 PM TECHNIQUE: Magnetic resonance angiography images of the head without intravenous contrast. Multiplanar 2D and 3D reformatted images are provided for review. COMPARISON: MR head 12/04/2023. CLINICAL HISTORY: Stroke/TIA, determine embolic source; Concern for watershed stroke. Radiology recommended MRA head and neck due to AKI. FINDINGS: ANTERIOR CIRCULATION: Mild atherosclerotic changes are present in the cavernous and supraclinoid internal carotid arteries bilaterally without significant stenoses. No significant stenosis of the anterior cerebral arteries. No significant stenosis of the middle cerebral arteries. No aneurysm. POSTERIOR CIRCULATION: No significant stenosis of the posterior cerebral arteries. No significant stenosis of the basilar artery. No significant stenosis of the vertebral arteries. No aneurysm. IMPRESSION: 1. No significant stenosis of the intracranial vasculature. 2. Mild atherosclerotic changes in the cavernous and supraclinoid internal carotid arteries bilaterally. Electronically signed by: Lonni Necessary MD 12/04/2023 05:39 PM EDT RP Workstation: [id number]   DG Chest Portable 1 View Result Date: 12/04/2023 EXAM: 1 VIEW XRAY OF THE CHEST 12/04/2023 04:27:00 PM COMPARISON: 11/22/2023 CLINICAL HISTORY: covid +, fatigue, falls, urinary retention, aki. Reason for exam: covid +, fatigue, falls, urinary retention, aki; Triage notes: ; Pt from home and home health c/o left sided weakness. Pt states he can not urinate. Denies left sided weakness. Axox4. VSS. FINDINGS: LUNGS AND PLEURA: No focal pulmonary opacity. No pulmonary edema. No pleural effusion. No pneumothorax. HEART AND MEDIASTINUM: No acute abnormality of the cardiac and mediastinal silhouettes. BONES AND SOFT TISSUES: No acute osseous abnormality. VASCULATURE: Aortic atherosclerosis. IMPRESSION: 1. No acute findings. Electronically signed by: Selinda Blue MD 12/04/2023 04:54 PM EDT RP  Workstation: [id number]   MR LUMBAR SPINE WO CONTRAST Result Date: 12/04/2023 EXAM: MRI LUMBAR SPINE 12/04/2023 02:53:59 PM TECHNIQUE: Multiplanar multisequence MRI of the lumbar spine was performed without the administration of intravenous contrast. COMPARISON: MRI of the lumbar spine 07/12/2014. CLINICAL HISTORY: Low back pain, trauma; Left leg weakness that is new. Recent fall. Also urinary retention that is new. Pt from home and home health c/o left sided weakness. Pt states he can not urinate. Denies left sided weakness. Axox4. VSS. FINDINGS: BONES AND ALIGNMENT: Mild leftward curvature is centered at L2-3. Slight retrolisthesis at L2-3 is stable. Grade 1 anterolisthesis at L4-5 has progressed. SPINAL CORD: The conus terminates normally. SOFT TISSUES: A 4.3 cm simple cyst is present at the lower pole of the right kidney. Left renal atrophy is new since the prior study. Atherosclerotic changes are present within the abdominal aorta. Maximal diameter is 31 mm. L1-L2: Mild leftward disc protrusion at L1-2 is new without significant stenosis. L2-L3: A broad-based disc protrusion, asymmetric to the right, and moderate bilateral facet hypertrophy has progressed at L2-3. Moderate right and mild left subarticular stenosis is present. Mild right foraminal narrowing is  present. L3-L4: A leftward broad-based disc protrusion is similar to prior study. Mild left subarticular and foraminal stenosis is stable. L4-L5: Progressive facet hypertrophy is present bilaterally. Moderate subarticular and left foraminal narrowing is similar to the prior study. L5-S1: Mild facet hypertrophy is present at L5 to S1. No focal disc protrusion or stenosis is present. IMPRESSION: 1. Progressed grade 1 anterolisthesis at L4-5. 2. New mild leftward disc protrusion at L1-2 without significant stenosis. 3. Progressed broad-based disc protrusion at L2-3 with moderate right and mild left subarticular stenosis and mild right foraminal narrowing.  4. Stable leftward broad-based disc protrusion at L3-4 with mild left subarticular and foraminal stenosis. 5. Moderate subarticular and left foraminal narrowing at L4-5, similar to prior study. Electronically signed by: Lonni Necessary MD 12/04/2023 03:40 PM EDT RP Workstation: [id number]   MR BRAIN WO CONTRAST Result Date: 12/04/2023 EXAM: MR Brain Without Intravenous Contrast. CLINICAL HISTORY: Head trauma, minor (Age >= 65y). Pt from home and home health c/o left sided weakness. Pt states he can not urinate. Denies left sided weakness. Axox4. VSS. TECHNIQUE: Magnetic resonance images of the brain without intravenous contrast in multiple planes. CONTRAST: None. COMPARISON: CT head without contrast 12/01/2023. FINDINGS: BRAIN: Scattered foci of acute/subacute nonhemorrhagic infarcts are present in a right watershed distribution. Infarcts are present within the right precentral gyrus. More confluent areas of infarction are present anteriorly in the right middle frontal gyrus and in the lateral right occipital lobe. A single focus of acute/subacute infarction is present anteriorly in the left frontal lobe. White matter changes extend into the brainstem. Remote lacunar infarcts are present within the basal ganglia and thalami bilaterally. Remote lacunar infarcts are present in the right cerebral cerebellar hemisphere. Susceptibility artifact are present in the anterior frontal lobes bilaterally. VENTRICLES: No hydrocephalus. ORBITS: CALL MEASURES CURRENT ER CALL MEASURES CURRENT EMERGENCY DEPARTMENT Bilateral lens replacements are noted. The globes and orbits are otherwise within normal limits. SINUSES AND MASTOIDS: Moderate mucosal thickening is present in the anterior ethmoid air cells bilaterally. The right frontal sinus is near completely opacified. The left frontal sinus is hypoplastic. A small right mastoid effusion is present. No obstructing nasopharyngeal lesion is present. BONES: No acute fracture or  focal osseous lesion. IMPRESSION: 1. Scattered foci of acute/subacute nonhemorrhagic infarcts in a right watershed distribution, including the right precentral gyrus, right middle frontal gyrus, and lateral right occipital lobe. This suggests either right carotid stenosis or right-sided embolic disease 2. A single focus of acute/subacute infarction is present anteriorly in the left frontal lobe. 3. Remote lacunar infarcts in the basal ganglia and thalami bilaterally, as well as in the right cerebral cerebellar hemisphere. 4. Moderate mucosal thickening in the anterior ethmoid air cells bilaterally and near complete opacification of the right frontal sinus. 5. Small right mastoid effusion. Findings were called to Dr. Ginger at 3:30pm Electronically signed by: Lonni Necessary MD 12/04/2023 03:34 PM EDT RP Workstation: [id number]    Scheduled Meds:   stroke: early stages of recovery book   Does not apply Once   aspirin  EC  81 mg Oral Daily   budesonide -glycopyrrolate -formoterol   2 puff Inhalation BID   Chlorhexidine  Gluconate Cloth  6 each Topical Q0600   clopidogrel   75 mg Oral Daily   pantoprazole   40 mg Oral Daily   rosuvastatin   10 mg Oral Daily   senna-docusate  1 tablet Oral QHS   tamsulosin   0.4 mg Oral QPC supper   Continuous Infusions:  sodium chloride        LOS: 1  day   Fredia Skeeter, MD [facility]  12/05/2023, 8:35 AM   *Please note that this is a verbal dictation therefore any spelling or grammatical errors are due to the Dragon Medical One system interpretation.  Please page via Amion and do not message via secure chat for urgent patient care matters. Secure chat can be used for non urgent patient care matters.  How to contact the [facility] Attending or Consulting provider 7A - 7P or covering provider during after hours 7P -7A, for this patient?  Check the care team in [facility] and look for a) attending/consulting [facility] provider listed and b) the [facility] team listed. Page or secure  chat 7A-7P. Log into [email or web address] and use [email or web address]'s universal password to access. If you do not have the password, please contact the hospital operator. Locate the [facility] provider you are looking for under [facility] and page to a number that you can be directly reached. If you still have difficulty reaching the provider, please page the [facility] (Director on Call) for the Hospitalists listed on amion for assistance.

## 2023-12-05 NOTE — Progress Notes (Addendum)
 STROKE TEAM PROGRESS NOTE    SIGNIFICANT HOSPITAL EVENTS  9/1: presented with left leg weakness, s/p fall 8/29  INTERIM HISTORY/SUBJECTIVE  Daughter at bedside. Working with PT this morning.   PENDING Carotid US    OBJECTIVE  CBC    Component Value Date/Time   WBC 11.3 (H) 12/04/2023 1256   RBC 3.40 (L) 12/04/2023 1256   HGB 10.8 (L) 12/04/2023 1256   HCT 33.8 (L) 12/04/2023 1256   PLT 207 12/04/2023 1256   MCV 99.4 12/04/2023 1256   MCH 31.8 12/04/2023 1256   MCHC 32.0 12/04/2023 1256   RDW 12.4 12/04/2023 1256   LYMPHSABS 1.8 12/04/2023 1256   MONOABS 1.0 12/04/2023 1256   EOSABS 0.2 12/04/2023 1256   BASOSABS 0.0 12/04/2023 1256    BMET    Component Value Date/Time   NA 137 12/04/2023 1256   K 4.9 12/04/2023 1256   CL 102 12/04/2023 1256   CO2 25 12/04/2023 1256   GLUCOSE 101 (H) 12/04/2023 1256   BUN 58 (H) 12/04/2023 1256   CREATININE 3.02 (H) 12/04/2023 1256   CREATININE 2.09 (H) 11/22/2019 1054   CALCIUM  8.7 (L) 12/04/2023 1256   EGFR 36.0 08/30/2022 1510   GFRNONAA 19 (L) 12/04/2023 1256   GFRNONAA 27 (L) 11/22/2019 1054    IMAGING past 24 hours MR Angiogram Neck W or Wo Contrast Result Date: 12/04/2023 EXAM: MRA Neck without and with contrast 12/04/2023 05:26:26 PM TECHNIQUE: Multiplanar multisequence MRA of the neck was performed without and with the administration of intravenous contrast. 2D and 3D reformatted images are provided for review. Stenosis of the internal carotid arteries is measured using NASCET criteria. COMPARISON: None available CLINICAL HISTORY: Stroke/TIA, determine embolic source; Concern for watershed stroke. Radiology recommended MRA head and neck due to AKI. FINDINGS: CAROTID ARTERIES: Some slight images demonstrate asymmetric flow disturbance in the proximal right internal carotid artery. Irregular posterior atherosclerotic plaque is present along the proximal right ICA. 70 plus percent stenosis is present. No significant stenosis is  present at the left ICA. Mild irregularity is present in the cavernous internal carotid arteries bilaterally. VERTEBRAL ARTERIES: Moderate focal stenosis is present in the distal left vertebral artery at the vertebral basilar junction. No dissection. IMPRESSION: 1. Irregular posterior atherosclerotic plaque in the proximal right ICA with 70+% stenosis relative to the more distal vessel . 2. Moderate focal stenosis in the distal left vertebral artery at the vertebrobasilar junction. 3. Mild irregularity in the cavernous internal carotid arteries bilaterally. Electronically signed by: Lonni Necessary MD 12/04/2023 05:42 PM EDT RP Workstation: [id number]   MR ANGIO HEAD WO CONTRAST Result Date: 12/04/2023 EXAM: MR Angiography Head without intravenous Contrast. 12/04/2023 05:26:26 PM TECHNIQUE: Magnetic resonance angiography images of the head without intravenous contrast. Multiplanar 2D and 3D reformatted images are provided for review. COMPARISON: MR head 12/04/2023. CLINICAL HISTORY: Stroke/TIA, determine embolic source; Concern for watershed stroke. Radiology recommended MRA head and neck due to AKI. FINDINGS: ANTERIOR CIRCULATION: Mild atherosclerotic changes are present in the cavernous and supraclinoid internal carotid arteries bilaterally without significant stenoses. No significant stenosis of the anterior cerebral arteries. No significant stenosis of the middle cerebral arteries. No aneurysm. POSTERIOR CIRCULATION: No significant stenosis of the posterior cerebral arteries. No significant stenosis of the basilar artery. No significant stenosis of the vertebral arteries. No aneurysm. IMPRESSION: 1. No significant stenosis of the intracranial vasculature. 2. Mild atherosclerotic changes in the cavernous and supraclinoid internal carotid arteries bilaterally. Electronically signed by: Lonni Necessary MD 12/04/2023 05:39 PM EDT RP Workstation:  [id number]   DG Chest Portable 1 View Result Date:  12/04/2023 EXAM: 1 VIEW XRAY OF THE CHEST 12/04/2023 04:27:00 PM COMPARISON: 11/22/2023 CLINICAL HISTORY: covid +, fatigue, falls, urinary retention, aki. Reason for exam: covid +, fatigue, falls, urinary retention, aki; Triage notes: ; Pt from home and home health c/o left sided weakness. Pt states he can not urinate. Denies left sided weakness. Axox4. VSS. FINDINGS: LUNGS AND PLEURA: No focal pulmonary opacity. No pulmonary edema. No pleural effusion. No pneumothorax. HEART AND MEDIASTINUM: No acute abnormality of the cardiac and mediastinal silhouettes. BONES AND SOFT TISSUES: No acute osseous abnormality. VASCULATURE: Aortic atherosclerosis. IMPRESSION: 1. No acute findings. Electronically signed by: Selinda Blue MD 12/04/2023 04:54 PM EDT RP Workstation: [id number]   MR LUMBAR SPINE WO CONTRAST Result Date: 12/04/2023 EXAM: MRI LUMBAR SPINE 12/04/2023 02:53:59 PM TECHNIQUE: Multiplanar multisequence MRI of the lumbar spine was performed without the administration of intravenous contrast. COMPARISON: MRI of the lumbar spine 07/12/2014. CLINICAL HISTORY: Low back pain, trauma; Left leg weakness that is new. Recent fall. Also urinary retention that is new. Pt from home and home health c/o left sided weakness. Pt states he can not urinate. Denies left sided weakness. Axox4. VSS. FINDINGS: BONES AND ALIGNMENT: Mild leftward curvature is centered at L2-3. Slight retrolisthesis at L2-3 is stable. Grade 1 anterolisthesis at L4-5 has progressed. SPINAL CORD: The conus terminates normally. SOFT TISSUES: A 4.3 cm simple cyst is present at the lower pole of the right kidney. Left renal atrophy is new since the prior study. Atherosclerotic changes are present within the abdominal aorta. Maximal diameter is 31 mm. L1-L2: Mild leftward disc protrusion at L1-2 is new without significant stenosis. L2-L3: A broad-based disc protrusion, asymmetric to the right, and moderate bilateral facet hypertrophy has progressed at L2-3.  Moderate right and mild left subarticular stenosis is present. Mild right foraminal narrowing is present. L3-L4: A leftward broad-based disc protrusion is similar to prior study. Mild left subarticular and foraminal stenosis is stable. L4-L5: Progressive facet hypertrophy is present bilaterally. Moderate subarticular and left foraminal narrowing is similar to the prior study. L5-S1: Mild facet hypertrophy is present at L5 to S1. No focal disc protrusion or stenosis is present. IMPRESSION: 1. Progressed grade 1 anterolisthesis at L4-5. 2. New mild leftward disc protrusion at L1-2 without significant stenosis. 3. Progressed broad-based disc protrusion at L2-3 with moderate right and mild left subarticular stenosis and mild right foraminal narrowing. 4. Stable leftward broad-based disc protrusion at L3-4 with mild left subarticular and foraminal stenosis. 5. Moderate subarticular and left foraminal narrowing at L4-5, similar to prior study. Electronically signed by: Lonni Necessary MD 12/04/2023 03:40 PM EDT RP Workstation: [id number]   MR BRAIN WO CONTRAST Result Date: 12/04/2023 EXAM: MR Brain Without Intravenous Contrast. CLINICAL HISTORY: Head trauma, minor (Age >= 65y). Pt from home and home health c/o left sided weakness. Pt states he can not urinate. Denies left sided weakness. Axox4. VSS. TECHNIQUE: Magnetic resonance images of the brain without intravenous contrast in multiple planes. CONTRAST: None. COMPARISON: CT head without contrast 12/01/2023. FINDINGS: BRAIN: Scattered foci of acute/subacute nonhemorrhagic infarcts are present in a right watershed distribution. Infarcts are present within the right precentral gyrus. More confluent areas of infarction are present anteriorly in the right middle frontal gyrus and in the lateral right occipital lobe. A single focus of acute/subacute infarction is present anteriorly in the left frontal lobe. White matter changes extend into the brainstem. Remote lacunar  infarcts are present within the basal ganglia and  thalami bilaterally. Remote lacunar infarcts are present in the right cerebral cerebellar hemisphere. Susceptibility artifact are present in the anterior frontal lobes bilaterally. VENTRICLES: No hydrocephalus. ORBITS: CALL MEASURES CURRENT ER CALL MEASURES CURRENT EMERGENCY DEPARTMENT Bilateral lens replacements are noted. The globes and orbits are otherwise within normal limits. SINUSES AND MASTOIDS: Moderate mucosal thickening is present in the anterior ethmoid air cells bilaterally. The right frontal sinus is near completely opacified. The left frontal sinus is hypoplastic. A small right mastoid effusion is present. No obstructing nasopharyngeal lesion is present. BONES: No acute fracture or focal osseous lesion. IMPRESSION: 1. Scattered foci of acute/subacute nonhemorrhagic infarcts in a right watershed distribution, including the right precentral gyrus, right middle frontal gyrus, and lateral right occipital lobe. This suggests either right carotid stenosis or right-sided embolic disease 2. A single focus of acute/subacute infarction is present anteriorly in the left frontal lobe. 3. Remote lacunar infarcts in the basal ganglia and thalami bilaterally, as well as in the right cerebral cerebellar hemisphere. 4. Moderate mucosal thickening in the anterior ethmoid air cells bilaterally and near complete opacification of the right frontal sinus. 5. Small right mastoid effusion. Findings were called to Dr. Ginger at 3:30pm Electronically signed by: Lonni Necessary MD 12/04/2023 03:34 PM EDT RP Workstation: [id number]    Vitals:   12/05/23 0000 12/05/23 0200 12/05/23 0400 12/05/23 0736  BP:    122/86  Pulse:    86  Resp: 16 16 17 16   Temp:    98.4 F (36.9 C)  TempSrc:    Oral  SpO2:    99%  Weight:      Height:         PHYSICAL EXAM General:  Alert, well-nourished, well-developed patient in no acute distress Psych:  Mood and affect  appropriate for situation CV: Regular rate and rhythm on monitor Respiratory:  Regular, unlabored respirations on room air GI: Abdomen soft and nontender   NEURO:  Mental Status: awake and alert. Confused to time and situation.  Speech/Language: speech is without dysarthria or aphasia.  .  Cranial Nerves:  II: PERRL. Visual fields full.  III, IV, VI: EOMI. Eyelids elevate symmetrically.  V: Sensation is intact to light touch and symmetrical to face.  VII: Mild Left facial droop VIII: hearing intact to voice. IX, X: Palate elevates symmetrically. Phonation is normal.  KP:Dynloizm shrug 5/5. XII: tongue is midline without fasciculations. Motor:  LUE: 4/5 LLE: 4/5 proximal, 4-/5 distal with drift  Tone: is normal and bulk is normal Sensation- Intact to light touch bilaterally. Extinction absent to light touch to DSS.   Coordination: FTN slow but intact bilaterally Gait- deferred  Most Recent NIH: 4     ASSESSMENT/PLAN  Stephen Wilkins is a 89 y.o. male with history of HTN, HLD, GERD, dementia, COPD, anxiety, hypothyroidism, esophageal stricture, CKD, recent fall, GI bleed and thoracic aortic aneurysm presented with weakness in the left leg.  MRI brain showed acute ischemic watershed distribution in the right MCA territory, left frontal infarct.   NIH on Admission: 5.  Stroke:  right MCA watershed distribution infarcts and several punctate left ACA and PCA infarcts, concerning for cardioembolic source Code Stroke CT head: Left supraorbital scalp laceration and hematoma. No acute intracranial abnormality. No skull fracture. MRI  Scattered acute/subacute nonhemorrhagic infarcts right watershed distribution, including right precentral gyrus, right middle frontal gyrus and lateral right occipital lobe. Single acute/subacute infarct anterior left frontal lobe. Remote lacunar infarcts bilateral basal ganglia and thalami MRA  Head: No  significant stenosis MRA Neck: Irregular posterior  atherosclerotic plaque proximal right ICA with 70+ percent stenosis. Moderate focal stenosis distal left vertebral artery at vertebrobasilar junction Carotid Doppler R ICA 40-59% stenosis 2D Echo: EF 60 to 65%, grade 1 diastolic dysfunction, mild MS, negative bubble study Recommend 30-day cardiac event monitoring as outpatient to rule out A-fib LDL 49 HgbA1c 6.3 VTE prophylaxis - ok for lovenox No antithrombotic prior to admission, now on aspirin  81 mg daily and clopidogrel  75 mg daily for 3 months due to large vessel disease and then Aspirin  81mg  alone.  Therapy recommendations:  SNF Disposition:  pending  Carotid stenosis MRA Neck: Irregular posterior atherosclerotic plaque proximal right ICA with 70+ percent stenosis. Moderate focal stenosis distal left vertebral artery at vertebrobasilar junction Carotid Doppler R ICA 40-59% stenosis Recommend outpatient vascular surgery follow-up for monitoring  Possible recent left BRAO Reported left eye vision deficit 1 month ago Now still reporting left eye central vision loss Not much on exam today Left ICA patent  Continue DAPT and follow up with ophthalmologist.   AKI on CKD IIIb Dehydration  Transient head and hand tremor Baseline Cre 1.7-2.4 This admission Cre 3.02 S/p IVF Head and hand tremor improved Continue monitoring BMP  Hypertension Home meds: Losartan  100 mg Stable Long term BP goal: normotensive  Avoid hypotension  Hyperlipidemia Home meds:  Crestor  10mg , resumed in hospital LDL 49, goal < 70 Continue statin at discharge  Pre-diabetic Home meds:  none HgbA1c 6.3, goal < 7.0 CBGs SSI Recommend close follow-up with PCP   Other Stroke Risk Factors Advanced Age Former Cigarette Smoker  Other Active Problems, per Primary Vascular dementia, Mild cognitive impairment BPH, home meds: Flomax  0.4 mg twice daily, Vesicare  5 mg daily Recent falls COPD  Hospital day # 1   Pt seen by Neuro NP/APP and later by MD.  Note/plan to be edited by MD as needed.    Stephen JAYSON Likes, DNP, AGACNP-BC [facility] Please use AMION for contact information & EPIC for messaging.   ATTENDING NOTE: I reviewed above note and agree with the assessment and plan. Pt was seen and examined.   Daughter at the bedside.  Patient lying in bed, awake, alert, eyes open, orientated to place, time and people but not to age. No aphasia, fluent language, following all simple commands. Able to name and repeat. No gaze palsy, tracking bilaterally, visual field full, did not see much left eye central vision loss as he reported. No facial droop. Tongue midline. LUE mild drift and BLE proximal 4/5 not cooperative with PF/DF. RUE no drift. Sensation symmetrical bilaterally, b/l FTN intact but slow on the left, gait not tested.   Patient stroke bilaterally involvement, concerning for cardioembolic source.  He does have right ICA stenosis, will need outpatient vascular surgery follow-up.  He does have possible recent left BRVO, but no left ICA stenosis and vision has largely resolved.  Will recommend 30-day cardiac event monitoring as outpatient.  Continue DAPT for 3 months and then aspirin  alone.  Continue statin.  Follow[facility]up at [facility]  PT and OT recommend SNF.  For detailed assessment and plan, please refer to above as I have made changes wherever appropriate.   Neurology will sign off. Please call with questions. Pt will follow up with stroke clinic NP at [facility] in about 4 weeks. Thanks for the consult.   Ary Cummins, MD PhD Stroke Neurology 12/05/2023 5:09 PM     To contact Stroke Continuity provider, please refer to [email or web address]. After  hours, contact General Neurology

## 2023-12-05 NOTE — Evaluation (Signed)
 Occupational Therapy Evaluation Patient Details Name: Stephen Wilkins MRN: [id number] DOB: 1928-02-03 Today's Date: 12/05/2023   History of Present Illness   89 y.o. presented with weakness in the left leg and inability to urinate. MRI brain showed acute ischemicsin a watershed distribution in the right MCA territory as well as a left frontal infarct.  PMH: hypertension, hyperlipidemia, GERD, dementia, COPD, anxiety, hypothyroidism, esophageal stricture, CKD, recent fall, GI bleed and thoracic aortic aneurysm.     Clinical Impressions PTA pt lived @ modified independent level with his wife using a RW and had services from [facility] 2 hrs/day, 7 days/wk. Pt has had a recent decline in his functional ability, requiring more assistance and has had 6 falls in 2 weeks. Pt demonstrates a significant functional decline requiring mod A with mobility and Mod to Max A with ADL tasks due to below listed deficits. Patient will benefit from continued inpatient follow up therapy, <3 hours/day to maximize functional level of independence. Acute OT to follow.      If plan is discharge home, recommend the following:   A lot of help with walking and/or transfers;A lot of help with bathing/dressing/bathroom;Assistance with cooking/housework;Assistance with feeding;Direct supervision/assist for medications management;Direct supervision/assist for financial management;Assist for transportation;Help with stairs or ramp for entrance     Functional Status Assessment   Patient has had a recent decline in their functional status and demonstrates the ability to make significant improvements in function in a reasonable and predictable amount of time.     Equipment Recommendations   None recommended by OT     Recommendations for Other Services         Precautions/Restrictions   Precautions Precautions: Fall Restrictions Weight Bearing Restrictions Per Provider Order: No     Mobility Bed  Mobility Overal bed mobility: Needs Assistance Bed Mobility: Sit to Supine       Sit to supine: Mod assist   General bed mobility comments: A to lift BLE back into bed    Transfers                          Balance Overall balance assessment: History of Falls, Needs assistance (6 recent falls) Sitting-balance support: Feet supported Sitting balance-Leahy Scale: Fair       Standing balance-Leahy Scale: Poor                             ADL either performed or assessed with clinical judgement   ADL Overall ADL's : Needs assistance/impaired Eating/Feeding: Set up;Supervision/ safety   Grooming: Moderate assistance   Upper Body Bathing: Moderate assistance   Lower Body Bathing: Moderate assistance;Sit to/from stand   Upper Body Dressing : Moderate assistance   Lower Body Dressing: Moderate assistance;Sit to/from stand Lower Body Dressing Details (indicate cue type and reason): donned 2 socks on 1 foot Toilet Transfer: Moderate assistance;Cueing for safety;Cueing for sequencing;Ambulation Toilet Transfer Details (indicate cue type and reason): difficulty sequencing task; reaching for grab bar when in doorway requiring hand over hand to guide pt to toilet Toileting- Clothing Manipulation and Hygiene: Moderate assistance       Functional mobility during ADLs: Moderate assistance;Cueing for safety;Cueing for sequencing;Rolling walker (2 wheels)       Vision Baseline Vision/History: 1 Wears glasses Ability to See in Adequate Light: 1 Impaired Vision Assessment?: Yes Eye Alignment: Within Functional Limits Ocular Range of Motion: Within Functional Limits Alignment/Gaze Preference: Within  Defined Limits Tracking/Visual Pursuits: Decreased smoothness of horizontal tracking;Decreased smoothness of vertical tracking Saccades: Additional head turns occurred during testing Visual Fields: Left visual field deficit Additional Comments: running into objects  on L     Perception Perception: Impaired       Praxis         Pertinent Vitals/Pain Pain Assessment Pain Assessment: No/denies pain     Extremity/Trunk Assessment Upper Extremity Assessment Upper Extremity Assessment: Right hand dominant (using BUE functionally; in coordination noted)   Lower Extremity Assessment Lower Extremity Assessment: Defer to PT evaluation   Cervical / Trunk Assessment Cervical / Trunk Assessment: Kyphotic   Communication Communication Communication: Impaired Factors Affecting Communication: Hearing impaired   Cognition Arousal: Alert Behavior During Therapy: WFL for tasks assessed/performed Cognition: Cognition impaired   Orientation impairments: Situation Awareness: Intellectual awareness impaired, Online awareness impaired Memory impairment (select all impairments): Short-term memory, Working Civil Service fast streamer, Non-declarative long-term memory, Geneticist, molecular long-term memory Attention impairment (select first level of impairment): Sustained attention Executive functioning impairment (select all impairments): Initiation, Organization, Sequencing, Reasoning, Problem solving                   Following commands: Impaired Following commands impaired: Only follows one step commands consistently     Cueing  General Comments   Cueing Techniques: Verbal cues;Gestural cues;Tactile cues;Visual cues      Exercises     Shoulder Instructions      Home Living Family/patient expects to be discharged to:: Private residence Living Arrangements: Spouse/significant other Available Help at Discharge: Family;Available 24 hours/day Type of Home: House Home Access: Stairs to enter [employer] of Steps: 2 Entrance Stairs-Rails: Right;Left Home Layout: One level     Bathroom Shower/Tub: Estate manager/land agent Accessibility: Yes   Home Equipment: Agricultural consultant (2 wheels);Grab bars - tub/shower;Rollator (4 wheels);Transport chair;Cane -  single point;Shower seat (railing in halls, on wall next to bed)   Additional Comments: [facility] 6-7 days/wk, 1 PM - 6 PM and also 6 PM -10 PM to help pt and pt spouse get ready for bed. Pt daughter lives 4 hours away, son lives 1.5 hours away. Pt spouse unable to help physically - on oxygen      Prior Functioning/Environment Prior Level of Function : Needs assist             Mobility Comments: using RW ADLs Comments: recently independent with ADL's. [facility] helps with taking trash out, bringing mail in. Also has help for cleaning and yard work. Pt can drive scooter around [employer]    OT Problem List: Decreased strength;Decreased activity tolerance;Impaired balance (sitting and/or standing);Impaired vision/perception;Decreased coordination;Decreased cognition;Decreased safety awareness;Decreased knowledge of use of DME or AE;Cardiopulmonary status limiting activity   OT Treatment/Interventions: Self-care/ADL training;Therapeutic exercise;Energy conservation;DME and/or AE instruction;Therapeutic activities;Cognitive remediation/compensation;Patient/family education;Balance training;Visual/perceptual remediation/compensation      OT Goals(Current goals can be found in the care plan section)   Acute Rehab OT Goals Patient Stated Goal: per family to get rehab OT Goal Formulation: With patient/family Time For Goal Achievement: 12/19/23 Potential to Achieve Goals: Good   OT Frequency:  Min 2X/week    Co-evaluation              AM-PAC OT 6 Clicks Daily Activity     Outcome Measure Help from another person eating meals?: A Little Help from another person taking care of personal grooming?: A Lot Help from another person toileting, which includes using toliet, bedpan, or urinal?: A Lot Help from  another person bathing (including washing, rinsing, drying)?: A Lot Help from another person to put on and taking off regular upper body clothing?: A Lot Help from another  person to put on and taking off regular lower body clothing?: A Lot 6 Click Score: 13   End of Session Equipment Utilized During Treatment: Gait belt;Rolling walker (2 wheels) Nurse Communication: Mobility status  Activity Tolerance: Patient tolerated treatment well Patient left: in bed;with call bell/phone within reach;with bed alarm set;with family/visitor present  OT Visit Diagnosis: Unsteadiness on feet (R26.81);Other abnormalities of gait and mobility (R26.89);Muscle weakness (generalized) (M62.81);History of falling (Z91.81);Other symptoms and signs involving the nervous system (R29.898);Other symptoms and signs involving cognitive function                Time: 9073-8984 OT Time Calculation (min): 49 min Charges:  OT General Charges $OT Visit: 1 Visit OT Evaluation $OT Eval Moderate Complexity: 1 Mod OT Treatments $Self Care/Home Management : 23-37 mins  Kreg Sink, OT/L   Acute OT Clinical Specialist Acute Rehabilitation Services Pager [phone] Office [phone]   [facility] 12/05/2023, 10:19 AM

## 2023-12-05 NOTE — Evaluation (Signed)
 Physical Therapy Evaluation Patient Details Name: Stephen Wilkins MRN: [id number] DOB: 1927/07/10 Today's Date: 12/05/2023  History of Present Illness  89 y.o. presented with weakness in the left leg and inability to urinate. MRI brain showed acute ischemicsin a watershed distribution in the right MCA territory as well as a left frontal infarct.  PMH: hypertension, hyperlipidemia, GERD, dementia, COPD, anxiety, hypothyroidism, esophageal stricture, CKD, recent fall, GI bleed and thoracic aortic aneurysm.  Clinical Impression  PTA, pt lives with his spouse, is modI with ambulation using a RW and with ADL's. Pt has [facility] services 2 hours/day, 7 days/week. Pt with recent decline in his functional ability, requiring more assist with 6 falls in 2 weeks. Pt presents currently with left sided weakness, impaired standing balance, gait abnormalities. Pt requiring min assist for transfers and limited ambulation using RW (chair follow utilized). Demonstrates left foot drag, placing him at high risk for falls. Patient will benefit from continued inpatient follow up therapy, <3 hours/day to address deficits and maximize functional mobility.      If plan is discharge home, recommend the following: A little help with bathing/dressing/bathroom;Assistance with cooking/housework;A lot of help with walking and/or transfers;Assist for transportation;Help with stairs or ramp for entrance;Supervision due to cognitive status   Can travel by private vehicle   No    Equipment Recommendations None recommended by PT (defer)  Recommendations for Other Services       Functional Status Assessment Patient has had a recent decline in their functional status and demonstrates the ability to make significant improvements in function in a reasonable and predictable amount of time.     Precautions / Restrictions Precautions Precautions: Fall Restrictions Weight Bearing Restrictions Per Provider Order: No       Mobility  Bed Mobility Overal bed mobility: Needs Assistance Bed Mobility: Supine to Sit     Supine to sit: Supervision     General bed mobility comments: Increased time, HOB elevated, use of bed rail and foot board to position self    Transfers Overall transfer level: Needs assistance Equipment used: Rolling walker (2 wheels) Transfers: Sit to/from Stand, Bed to chair/wheelchair/BSC Sit to Stand: Min assist Stand pivot transfers: Min assist         General transfer comment: Light minA to power up to stand and pivotal steps from bed to chair    Ambulation/Gait Ambulation/Gait assistance: Min assist Gait Distance (Feet): 5 Feet Assistive device: Rolling walker (2 wheels) Gait Pattern/deviations: Step-to pattern, Decreased step length - left, Decreased stride length, Decreased dorsiflexion - left Gait velocity: decreased Gait velocity interpretation: <1.31 ft/sec, indicative of household ambulator   General Gait Details: MinA for postural stability, left foot drag, chair follow behind patient  Stairs            Wheelchair Mobility     Tilt Bed    Modified Rankin (Stroke Patients Only) Modified Rankin (Stroke Patients Only) Pre-Morbid Rankin Score: Moderate disability Modified Rankin: Moderately severe disability     Balance Overall balance assessment: History of Falls, Needs assistance (6 recent falls) Sitting-balance support: Feet supported Sitting balance-Leahy Scale: Fair     Standing balance support: Bilateral upper extremity supported Standing balance-Leahy Scale: Poor                               Pertinent Vitals/Pain Pain Assessment Pain Assessment: No/denies pain    Home Living Family/patient expects to be discharged to:: Private residence Living  Arrangements: Spouse/significant other Available Help at Discharge: Family;Available 24 hours/day Type of Home: House Home Access: Stairs to enter Entrance Stairs-Rails:  Doctor, general practice of Steps: 2   Home Layout: One level Home Equipment: Agricultural consultant (2 wheels);Grab bars - tub/shower;Rollator (4 wheels);Transport chair;Cane - single point;Shower seat (railing in halls, on wall next to bed) Additional Comments: [facility] 6-7 days/wk, 1 PM - 6 PM and also 6 PM -10 PM to help pt and pt spouse get ready for bed. Pt daughter lives 4 hours away, son lives 1.5 hours away. Pt spouse unable to help physically - on oxygen    Prior Function Prior Level of Function : Needs assist             Mobility Comments: using RW ADLs Comments: recently independent with ADL's. [facility] helps with taking trash out, bringing mail in. Also has help for cleaning and yard work. Pt can drive scooter around [place]     Extremity/Trunk Assessment   Upper Extremity Assessment Upper Extremity Assessment: Defer to OT evaluation    Lower Extremity Assessment Lower Extremity Assessment: Difficult to assess due to impaired cognition;LLE deficits/detail LLE Deficits / Details: Difficult to assess due to cognition. Pt able to hold against gravity, rests with L foot in increased inversion    Cervical / Trunk Assessment Cervical / Trunk Assessment: Kyphotic  Communication   Communication Communication: Impaired Factors Affecting Communication: Hearing impaired    Cognition Arousal: Alert Behavior During Therapy: WFL for tasks assessed/performed   PT - Cognitive impairments: History of cognitive impairments                       PT - Cognition Comments: Hx dementia. Pt A&O to self only; reports month is August and year is 2024. Pleasant and follows 1 step commands Following commands: Impaired Following commands impaired: Only follows one step commands consistently     Cueing Cueing Techniques: Verbal cues, Gestural cues, Tactile cues, Visual cues     General Comments      Exercises     Assessment/Plan    PT Assessment Patient  needs continued PT services  PT Problem List Decreased strength;Decreased activity tolerance;Decreased balance;Decreased mobility;Decreased cognition;Decreased safety awareness       PT Treatment Interventions DME instruction;Gait training;Functional mobility training;Stair training;Therapeutic activities;Therapeutic exercise;Balance training;Patient/family education    PT Goals (Current goals can be found in the Care Plan section)  Acute Rehab PT Goals Patient Stated Goal: pt daughter agreeable to rehab PT Goal Formulation: With patient/family Time For Goal Achievement: 12/19/23 Potential to Achieve Goals: Good    Frequency Min 3X/week     Co-evaluation               AM-PAC PT 6 Clicks Mobility  Outcome Measure Help needed turning from your back to your side while in a flat bed without using bedrails?: A Little Help needed moving from lying on your back to sitting on the side of a flat bed without using bedrails?: A Little Help needed moving to and from a bed to a chair (including a wheelchair)?: A Little Help needed standing up from a chair using your arms (e.g., wheelchair or bedside chair)?: A Little Help needed to walk in hospital room?: A Lot Help needed climbing 3-5 steps with a railing? : Total 6 Click Score: 15    End of Session Equipment Utilized During Treatment: Gait belt Activity Tolerance: Patient tolerated treatment well Patient left: in chair;with call bell/phone within  reach;with chair alarm set Nurse Communication: Mobility status PT Visit Diagnosis: Unsteadiness on feet (R26.81);Other abnormalities of gait and mobility (R26.89);History of falling (Z91.81);Difficulty in walking, not elsewhere classified (R26.2)    Time: 9147-9081 PT Time Calculation (min) (ACUTE ONLY): 26 min   Charges:   PT Evaluation $PT Eval Moderate Complexity: 1 Mod PT Treatments $Therapeutic Activity: 8-22 mins PT General Charges $$ ACUTE PT VISIT: 1 Visit          Aleck Daring, PT, DPT Acute Rehabilitation Services Office [phone]   Stephen Wilkins 12/05/2023, 11:13 AM

## 2023-12-06 ENCOUNTER — Other Ambulatory Visit: Payer: Self-pay | Admitting: Cardiology

## 2023-12-06 DIAGNOSIS — I6340 Cerebral infarction due to embolism of unspecified cerebral artery: Secondary | ICD-10-CM

## 2023-12-06 LAB — BASIC METABOLIC PANEL WITH GFR
Anion gap: 8 (ref 5–15)
BUN: 52 mg/dL — ABNORMAL HIGH (ref 8–23)
CO2: 22 mmol/L (ref 22–32)
Calcium: 7.9 mg/dL — ABNORMAL LOW (ref 8.9–10.3)
Chloride: 106 mmol/L (ref 98–111)
Creatinine, Ser: 2.56 mg/dL — ABNORMAL HIGH (ref 0.61–1.24)
GFR, Estimated: 23 mL/min — ABNORMAL LOW (ref >60)
Glucose, Bld: 115 mg/dL — ABNORMAL HIGH (ref 70–99)
Potassium: 4.6 mmol/L (ref 3.5–5.1)
Sodium: 136 mmol/L (ref 135–145)

## 2023-12-06 LAB — CBC WITH DIFFERENTIAL/PLATELET
Abs Immature Granulocytes: 0.07 K/uL (ref 0.00–0.07)
Basophils Absolute: 0 K/uL (ref 0.0–0.1)
Basophils Relative: 0 %
Eosinophils Absolute: 0.2 K/uL (ref 0.0–0.5)
Eosinophils Relative: 2 %
HCT: 28.6 % — ABNORMAL LOW (ref 39.0–52.0)
Hemoglobin: 9.5 g/dL — ABNORMAL LOW (ref 13.0–17.0)
Immature Granulocytes: 1 %
Lymphocytes Relative: 16 %
Lymphs Abs: 1.5 K/uL (ref 0.7–4.0)
MCH: 32 pg (ref 26.0–34.0)
MCHC: 33.2 g/dL (ref 30.0–36.0)
MCV: 96.3 fL (ref 80.0–100.0)
Monocytes Absolute: 1 K/uL (ref 0.1–1.0)
Monocytes Relative: 11 %
Neutro Abs: 6.4 K/uL (ref 1.7–7.7)
Neutrophils Relative %: 70 %
Platelets: 168 K/uL (ref 150–400)
RBC: 2.97 MIL/uL — ABNORMAL LOW (ref 4.22–5.81)
RDW: 12.4 % (ref 11.5–15.5)
WBC: 9.1 K/uL (ref 4.0–10.5)
nRBC: 0 % (ref 0.0–0.2)

## 2023-12-06 MED ORDER — SODIUM CHLORIDE 0.9 % IV SOLN: INTRAVENOUS | Status: AC

## 2023-12-06 NOTE — Progress Notes (Signed)
 Ordering 30 day monitor, per request of neurology, for stroke. Dr. Anner to read.

## 2023-12-06 NOTE — Evaluation (Signed)
 Speech Language Pathology Evaluation Patient Details Name: Stephen Wilkins MRN: [id number] DOB: [birth year] Today's Date: 12/06/2023 Time: 9044-8975 SLP Time Calculation (min) (ACUTE ONLY): 29 min  Problem List:  Patient Active Problem List   Diagnosis Date Noted   Stroke (HCC) 12/04/2023   Productive cough 11/22/2023   Recurrent falls 11/22/2023   CKD (chronic kidney disease) stage 4, GFR 15-29 ml/min (HCC) 11/22/2023   Unqualified visual loss, both eyes 06/01/2023   PAD (peripheral artery disease) (HCC) 06/01/2023   Vertigo 11/29/2022   History of GI diverticular bleed 11/13/2022   History of colonic polyps 11/13/2022   Lower GI bleeding 11/12/2022   Lower GI bleed 11/11/2022   Hematochezia 11/11/2022   Unintentional weight loss 11/11/2022   Chronic constipation 11/11/2022   Acute on chronic anemia 11/11/2022   Acute GI bleeding 11/11/2022   OAB (overactive bladder) 11/09/2022   Lesion of ear 11/09/2022   Epithelial (juvenile) corneal dystrophy, 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   Shortness of breath 08/02/2022   Thoracic aortic aneurysm without rupture (HCC) 08/02/2022   Asthmatic bronchitis 08/02/2022   Acute hypoxemic respiratory failure (HCC) 06/28/2022   Severe persistent asthma with exacerbation 06/05/2022   Unspecified corneal edema 06/02/2022   Asthma 04/25/2022   Benign prostatic hyperplasia without urinary obstruction 04/25/2022   Hearing loss 04/25/2022   HTN (hypertension) 04/25/2022   Mixed hyperlipidemia 04/25/2022   Encounter for fitting and adjustment of hearing aid 04/25/2022   Encounter for well adult exam with abnormal findings 04/25/2022   Urinary frequency 10/24/2021   Heart murmur 10/24/2021   Low energy 10/21/2021   Vitamin D  deficiency 04/16/2021   Pain in joint of left shoulder 01/23/2020   Bilateral shoulder pain 11/22/2019   Myalgia 11/22/2019   Gait disorder 11/22/2019   Chronic pain of right thumb 05/17/2019   Primary osteoarthritis of first  carpometacarpal joint of right hand 05/17/2019   Trigger finger of right thumb 05/17/2019   Corneal epithelial basement membrane dystrophy 03/04/2019   Keratoconjunctivitis sicca of both eyes not specified as Sjogren's 03/04/2019   Posterior vitreous detachment of both eyes 03/04/2019   Presbyopia of both eyes 03/04/2019   Pseudophakia of both eyes 03/04/2019   Febrile illness 12/27/2018   Choledocholithiasis 12/27/2018   Low back pain 07/11/2017   Non compliance w medication regimen 08/23/2016   Peripheral neuropathy 08/23/2016   COPD (chronic obstructive pulmonary disease) (HCC) 04/19/2016   Hyperglycemia 02/10/2016   Preventative health care 07/29/2015   CAP (community acquired pneumonia) 05/30/2015   Abnormal CXR 05/30/2015   Cough 04/22/2015   Asthma with exacerbation 04/22/2015   Urine abnormality 04/22/2015   Abdominal pain 09/11/2014   Abnormal liver function test 09/11/2014   Dysphagia 07/29/2014   Anxiety state 09/06/2013   CKD (chronic kidney disease) 09/06/2013   Hypothyroidism 09/06/2013   Rash and nonspecific skin eruption 02/27/2013   Dizziness and giddiness 01/04/2013   Abnormal TSH 06/04/2012   Gross hematuria 01/09/2012   Bruising 06/05/2011   Dementia (HCC) 09/01/2010   Cough variant asthma vs UACS  01/14/2010   ERECTILE DYSFUNCTION, ORGANIC 04/17/2009   FATIGUE 06/27/2008   PSA, INCREASED 06/27/2008   SCIATICA, RIGHT 04/28/2008   SCHATZKI'S RING 07/18/2007   Diaphragmatic hernia 07/18/2007   Schatzki's ring 07/18/2007   ABNORMAL ELECTROCARDIOGRAM 06/21/2007   Hyperlipidemia 03/23/2007   Essential hypertension 03/23/2007   Allergic rhinitis 03/23/2007   GERD 03/23/2007   BPH associated with nocturia 03/23/2007   NEPHROLITHIASIS, HX OF 03/23/2007   Past Medical History:  Past Medical History:  Diagnosis Date   ABNORMAL ELECTROCARDIOGRAM 06/21/2007   ALLERGIC RHINITIS 03/23/2007   Anxiety state, unspecified 09/06/2013   ASTHMATIC BRONCHITIS,  ACUTE 04/27/2007   BENIGN PROSTATIC HYPERTROPHY 03/23/2007   BRONCHITIS NOT SPECIFIED AS ACUTE OR CHRONIC 04/21/2007   BURSITIS, RIGHT HIP 07/31/2008   CKD (chronic kidney disease)    COPD (chronic obstructive pulmonary disease) (HCC) 04/19/2016   Cough 01/29/2009   Dementia (HCC) 09/01/2010   ERECTILE DYSFUNCTION 03/23/2007   Esophageal stricture    Falls frequently    FREQUENCY, URINARY 04/26/2010   GERD 03/23/2007   HIATAL HERNIA    HYPERLIPIDEMIA 03/23/2007   HYPERTENSION 03/23/2007   MILD COGNITIVE IMPAIRMENT SO STATED 05/19/2010   NEPHROLITHIASIS, HX OF 03/23/2007   Nocturnal confusion 12/04/2023   Daughter states sun downs at night   PSA, INCREASED 06/27/2008   REACTIVE AIRWAY DISEASE 01/14/2010   SCHATZKI'S RING    SCIATICA, RIGHT 04/28/2008   Unspecified hypothyroidism 09/06/2013   Vascular dementia [facility])    Past Surgical History:  Past Surgical History:  Procedure Laterality Date   COLONOSCOPY WITH PROPOFOL  N/A 11/12/2022   Procedure: COLONOSCOPY WITH PROPOFOL ;  Surgeon: Wilhelmenia Aloha Raddle., MD;  Location: [facility];  Service: Gastroenterology;  Laterality: N/A;   ERCP N/A 12/28/2018   Procedure: ENDOSCOPIC RETROGRADE CHOLANGIOPANCREATOGRAPHY (ERCP);  Surgeon: Aneita Gwendlyn DASEN, MD;  Location: [facility];  Service: Endoscopy;  Laterality: N/A;   HEMOSTASIS CLIP PLACEMENT  11/12/2022   Procedure: HEMOSTASIS CLIP PLACEMENT;  Surgeon: Wilhelmenia Aloha Raddle., MD;  Location: [facility];  Service: Gastroenterology;;   POLYPECTOMY  11/12/2022   Procedure: POLYPECTOMY;  Surgeon: Wilhelmenia Aloha Raddle., MD;  Location: [facility];  Service: Gastroenterology;;   REMOVAL OF STONES  12/28/2018   Procedure: REMOVAL OF STONES;  Surgeon: Aneita Gwendlyn DASEN, MD;  Location: [facility];  Service: Endoscopy;;  balloon sweep   SPHINCTEROTOMY  12/28/2018   Procedure: SPHINCTEROTOMY;  Surgeon: Aneita Gwendlyn DASEN, MD;  Location: [facility];  Service: Endoscopy;;   TONSILLECTOMY      HPI:  89 y.o. presented with weakness in the left leg and inability to urinate. MRI brain showed acute ischemicsin a watershed distribution in the right MCA territory as well as a left frontal infarct.  PMH: hypertension, hyperlipidemia, GERD, dementia, COPD, anxiety, hypothyroidism, esophageal stricture, CKD, recent fall, GI bleed and thoracic aortic aneurysm.  Pt's daughter present and reports he lives with his wife and he does have issues with sundowning.  He exercised religiously at [employer] until COVID, but he denies exercising at home.  Pt with recent falls - 2 1/2 to 3 weeks with functional decline per his daughter.  Pt duties at home include making coffee in the morning and getting things organized/closing up the house at night.  Daughter velives he takes memory medications.   Assessment / Plan / Recommendation Clinical Impression  Informal assessment conducted given pt's h/o dementia.  His language skills were intact - with no evidence of word finding deficits or aphasia.  Repetition intact and pt able to verbalize his needs.  He is hard of hearing and benefits from speaking to him slowly and slightly lower than normal.  He was able to follow 2 step directions, broke down at three complex steps.  He was oriented to month and year as well as current president and his full address.  But he could not read correct date on the calendar.    Pt is right handed but also uses his left hand for certain things per his daugher *  writes with right hand*.  Cog evaluation limited by staff arriving to take pt's meal orders.  SLP advised pt use his call bell when he needs assist - especially as plan is for him to dc to SNF. With max visual/verbal cues for adequate pressure to ring bell, pt able to call for juice.  Encouraged his daughter to have pt use the bell to help foster generalization- she was agreeable.    Pt will benefit from follow up SLP to address cog ling skills to decrease caregiver burden  and foster independence and decr caregiver burden.   Daughter present lives out of town and is visiting due to her parents illnesses.  Wrote sign and posted above bed. Left pt in room with daughter.  Of note, pt with frequent eructation during session and admits to worsening reflux - he is on a PPI per chart.    SLP Assessment  SLP Recommendation/Assessment: Patient needs continued Speech Language Pathology Services     Assistance Recommended at Discharge  None  Functional Status Assessment Patient has had a recent decline in their functional status and/or demonstrates limited ability to make significant improvements in function in a reasonable and predictable amount of time  Frequency and Duration min 1 x/week  1 week      SLP Evaluation Cognition  Overall Cognitive Status:  (at baseline pt has dementia) Arousal/Alertness: Awake/alert Orientation Level: Oriented to person Year: 2025 Month: September Day of Week: Incorrect (vision deficits) Attention: Sustained Sustained Attention: Appears intact Memory: Impaired Memory Impairment: Decreased recall of new information (use of call bell - but may have not recalled at baseline) Awareness: Impaired Problem Solving: Impaired Safety/Judgment: Appears intact       Comprehension  Auditory Comprehension Overall Auditory Comprehension: Appears within functional limits for tasks assessed Commands: Impaired One Step Basic Commands: 75-100% accurate Two Step Basic Commands: 75-100% accurate Multistep Basic Commands: 50-74% accurate Conversation: Simple Interfering Components: Hearing Visual Recognition/Discrimination Discrimination: Within Function Limits Reading Comprehension Reading Status: Not tested    Expression Expression Primary Mode of Expression: Verbal Verbal Expression Overall Verbal Expression: Appears within functional limits for tasks assessed Initiation: No impairment Repetition: No impairment Naming: Not  tested Pragmatics: No impairment Written Expression Dominant Hand: Right Written Expression: Not tested   Oral / Motor  Oral Motor/Sensory Function Overall Oral Motor/Sensory Function: Mild impairment Facial ROM: Reduced left Facial Symmetry: Abnormal symmetry left (left facial asymmetry from CVA) Lingual ROM: Within Functional Limits Lingual Symmetry: Within Functional Limits Lingual Strength: Within Functional Limits Lingual Sensation: Within Functional Limits Velum: Within Functional Limits Mandible: Within Functional Limits Motor Speech Overall Motor Speech: Appears within functional limits for tasks assessed Respiration: Within functional limits Resonance: Within functional limits Articulation: Within functional limitis Intelligibility: Intelligible Motor Planning: Not tested            Stephen Wilkins 12/06/2023, 10:43 AM Stephen POUR, MS [facility] SLP Acute Rehab Services Office [phone]

## 2023-12-06 NOTE — Plan of Care (Signed)
  Problem: Ischemic Stroke/TIA Tissue Perfusion: Goal: Complications of ischemic stroke/TIA will be minimized Outcome: Progressing   Problem: Coping: Goal: Will verbalize positive feelings about self Outcome: Progressing Goal: Will identify appropriate support needs Outcome: Progressing   Problem: Health Behavior/Discharge Planning: Goal: Ability to manage health-related needs will improve Outcome: Progressing Goal: Goals will be collaboratively established with patient/family Outcome: Progressing   Problem: Self-Care: Goal: Ability to participate in self-care as condition permits will improve Outcome: Progressing Goal: Verbalization of feelings and concerns over difficulty with self-care will improve Outcome: Progressing   Problem: Nutrition: Goal: Risk of aspiration will decrease Outcome: Progressing Goal: Dietary intake will improve Outcome: Progressing

## 2023-12-06 NOTE — Progress Notes (Signed)
 Patient refused vitals and neuro assessment

## 2023-12-06 NOTE — TOC Progression Note (Signed)
 Transition of Care [facility]) - Progression Note    Patient Details  Name: Stephen Wilkins MRN: [id number] Date of Birth: 1927-08-01  [facility]) CM/SW Contact  Almarie CHRISTELLA [place], [place] Phone Number: 12/06/2023, 10:31 AM  Clinical Narrative:   Patient received insurance authorization to admit to SNF. (SNF approval [id number], PTAR approval [phone].) CSW updated [facility], they do not have a bed available until tomorrow. CSW updated MD. CSW to follow.    Expected Discharge Plan: Skilled Nursing Facility Barriers to Discharge: Continued Medical Work up, English as a second language teacher               Expected Discharge Plan and Services     Post Acute Care Choice: Skilled Nursing Facility Living arrangements for the past 2 months: Single Family Home                                       Social Drivers of Health (SDOH) Interventions SDOH Screenings   Food Insecurity: No Food Insecurity (12/04/2023)  Housing: Low Risk  (12/04/2023)  Transportation Needs: No Transportation Needs (12/04/2023)  Utilities: Not At Risk (12/04/2023)  Alcohol Screen: Low Risk  (10/02/2023)  Depression (PHQ2-9): Low Risk  (10/02/2023)  Financial Resource Strain: Low Risk  (10/02/2023)  Physical Activity: Inactive (10/02/2023)  Social Connections: Moderately Isolated (12/04/2023)  Stress: No Stress Concern Present (10/02/2023)  Tobacco Use: Medium Risk (12/05/2023)  Health Literacy: Adequate Health Literacy (10/02/2023)    Readmission Risk Interventions    11/13/2022   10:22 AM  Readmission Risk Prevention Plan  Transportation Screening Complete  PCP or Specialist Appt within 5-7 Days Complete  Home Care Screening Complete  Medication Review (RN CM) Complete

## 2023-12-06 NOTE — Progress Notes (Signed)
 Physical Therapy Treatment Patient Details Name: Stephen Wilkins MRN: [id number] DOB: 1927-04-09 Today's Date: 12/06/2023   History of Present Illness 89 y.o. presented with weakness in the left leg and inability to urinate. MRI brain showed acute ischemicsin a watershed distribution in the right MCA territory as well as a left frontal infarct.  PMH: hypertension, hyperlipidemia, GERD, dementia, COPD, anxiety, hypothyroidism, esophageal stricture, CKD, recent fall, GI bleed and thoracic aortic aneurysm.    PT Comments  Pt received in supine and eager for mobility. Pt able to stand at sink to brush teeth with CGA for safety. After a seated rest break, pt able to significantly increase gait distance with min A for stability and frequent cues for technique. Pt's daughter reports pt tended to shuffle PTA, but that the LLE is weaker now. Pt able to improve L step length for a short time after resistance applied to LLE during swing phase. Pt encouraged to perform BLE exercise for strengthening independently. Pt continues to benefit from PT services to progress toward functional mobility goals.     If plan is discharge home, recommend the following: A little help with bathing/dressing/bathroom;Assistance with cooking/housework;A lot of help with walking and/or transfers;Assist for transportation;Help with stairs or ramp for entrance;Supervision due to cognitive status   Can travel by private vehicle     No  Equipment Recommendations  None recommended by PT (defer)    Recommendations for Other Services       Precautions / Restrictions Precautions Precautions: Fall Restrictions Weight Bearing Restrictions Per Provider Order: No     Mobility  Bed Mobility Overal bed mobility: Needs Assistance Bed Mobility: Supine to Sit     Supine to sit: Supervision     General bed mobility comments: increased time and cues    Transfers Overall transfer level: Needs assistance Equipment used: Rolling  walker (2 wheels) Transfers: Sit to/from Stand Sit to Stand: Mod assist, Min assist           General transfer comment: mod A initially progressing to min A for power up    Ambulation/Gait Ambulation/Gait assistance: Min assist Gait Distance (Feet): 120 Feet Assistive device: Rolling walker (2 wheels) Gait Pattern/deviations: Step-to pattern, Step-through pattern, Decreased stride length, Decreased dorsiflexion - left Gait velocity: decreased     General Gait Details: Pt demonstrates low B foot clearance and short step length (L>R) with slight improvement with frequent cues. Intermittent resistance provided to LLE during swing-through with some improvement in step length and clearance noted after resistance taken away   Stairs             Wheelchair Mobility     Tilt Bed    Modified Rankin (Stroke Patients Only) Modified Rankin (Stroke Patients Only) Pre-Morbid Rankin Score: Moderate disability Modified Rankin: Moderately severe disability     Balance Overall balance assessment: History of Falls, Needs assistance Sitting-balance support: Feet supported Sitting balance-Leahy Scale: Fair     Standing balance support: Bilateral upper extremity supported, During functional activity Standing balance-Leahy Scale: Poor Standing balance comment: with RW support, but pt able to static stand with single UE support without LOB                            Communication Communication Communication: Impaired Factors Affecting Communication: Hearing impaired  Cognition Arousal: Alert Behavior During Therapy: WFL for tasks assessed/performed   PT - Cognitive impairments: History of cognitive impairments  Following commands: Impaired Following commands impaired: Only follows one step commands consistently    Cueing Cueing Techniques: Verbal cues, Gestural cues, Tactile cues, Visual cues  Exercises General Exercises - Lower  Extremity Ankle Circles/Pumps: AROM, Both, 10 reps    General Comments        Pertinent Vitals/Pain Pain Assessment Pain Assessment: No/denies pain     PT Goals (current goals can now be found in the care plan section) Acute Rehab PT Goals Patient Stated Goal: pt daughter agreeable to rehab PT Goal Formulation: With patient/family Time For Goal Achievement: 12/19/23 Progress towards PT goals: Progressing toward goals    Frequency    Min 3X/week       AM-PAC PT 6 Clicks Mobility   Outcome Measure  Help needed turning from your back to your side while in a flat bed without using bedrails?: A Little Help needed moving from lying on your back to sitting on the side of a flat bed without using bedrails?: A Little Help needed moving to and from a bed to a chair (including a wheelchair)?: A Little Help needed standing up from a chair using your arms (e.g., wheelchair or bedside chair)?: A Little Help needed to walk in hospital room?: A Little Help needed climbing 3-5 steps with a railing? : Total 6 Click Score: 16    End of Session Equipment Utilized During Treatment: Gait belt Activity Tolerance: Patient tolerated treatment well Patient left: in chair;with call bell/phone within reach;with chair alarm set;with family/visitor present Nurse Communication: Mobility status PT Visit Diagnosis: Unsteadiness on feet (R26.81);Other abnormalities of gait and mobility (R26.89);History of falling (Z91.81);Difficulty in walking, not elsewhere classified (R26.2)     Time: 8950-8875 PT Time Calculation (min) (ACUTE ONLY): 35 min  Charges:    $Gait Training: 8-22 mins $Therapeutic Activity: 8-22 mins PT General Charges $$ ACUTE PT VISIT: 1 Visit                     Darryle George, PTA Acute Rehabilitation Services Secure Chat Preferred  [phone]    Darryle George 12/06/2023, 12:41 PM

## 2023-12-06 NOTE — Plan of Care (Signed)
  Problem: Education: Goal: Knowledge of disease or condition will improve Outcome: Not Progressing Goal: Knowledge of secondary prevention will improve (MUST DOCUMENT ALL) Outcome: Not Progressing Goal: Knowledge of patient specific risk factors will improve (DELETE if not current risk factor) Outcome: Not Progressing   Problem: Ischemic Stroke/TIA Tissue Perfusion: Goal: Complications of ischemic stroke/TIA will be minimized Outcome: Progressing

## 2023-12-06 NOTE — Progress Notes (Signed)
 PROGRESS NOTE    DEVYNN Wilkins  FMW:[id number] DOB: February 06, 1928 DOA: 12/04/2023 PCP: Norleen Lynwood ORN, MD   Brief Narrative:  [facility] is a 89 y.o. male with medical history significant of dementia, COPD, BPH, esophageal stricture, HTN, HLD, and diverticulosis presented with weakness in the left leg and inability to urinate.   The patient experienced a fall on Friday afternoon, resulting in a gash on the head and a black eye. Following the fall, the patient was transported to [facility], where a CT scan was performed and pt eventually discharged home the same day. Upon returning home, the pt was unable to ambulate or move LLE; thus, he re-presented to the ED and MRI revealed the presence of a stroke.  Patient was also found to have AKI on CKD.  Admitted to hospitalist service, neurology consulted.  Details below.  Assessment & Plan:   Principal Problem:   Stroke [facility])  Acute ischemic CVA: MRI brain shows scattered foci of acute/subacute nonhemorrhagic infarct in the right watershed distribution, including right precentral gyrus, right middle frontal gyrus and lateral right occipital lobe.  Also has infarct in the left frontal lobe and remote lacunar infarcts.  MRA neck shows 70% plus stenosis of the right ICA.  No intracranial stenosis.  Patient was seen by neurology, started on DAPT aspirin  and Plavix .  May need vascular intervention.  Doppler carotid shows 40 to 60% right ICA stenosis, no i vascular intervention is needed at the moment however recommend follow-up with vascular surgery outpatient for surveillance.  LDL only 49 but he remains on Crestor .  Echo shows normal ejection fraction with grade 1 diastolic dysfunction and no PFO.  PT OT recommended SNF, family and patient agreement with going to SNF once bed available, per TOC, he may have a bed tomorrow.  Neurology recommends continuing DAPT for 3 months and then aspirin  alone due to large vessel disease and also recommended 30-day cardiac event  monitoring and cardiology is aware of that.  Essential hypertension: PTA medication include losartan .  Need to allow permissive hypertension as low blood pressure may cause further worsening of the stroke.  However patient's blood pressure is within normal range despite of holding antihypertensives.  AKI on CKD stage IIIb: Baseline creatinine around 1.7, presented with 2.41 which peaked at 3.02 yesterday, improved to 2.56 with IV fluids but still elevated, will give him IV fluids for another 24 hours and repeat labs in the morning.  BPH/urinary retention: Initially presented with urinary retention, he is now voiding.  Continue Flomax .  Prediabetes: Hemoglobin A1c only 6.3.  Does not require any SSI.  DVT prophylaxis: SCDs Start: 12/04/23 1606   Code Status: Limited: Do not attempt resuscitation (DNR) -DNR-LIMITED -Do Not Intubate/DNI   Family Communication: Daughter present at bedside.  Plan of care discussed with patient in length and he/she verbalized understanding and agreed with it.  Status is: Inpatient Remains inpatient appropriate because: AKI, needs fluids.  Also pending placement.   Estimated body mass index is 24.95 kg/m as calculated from the following:   Height as of this encounter: 6' (1.829 m).   Weight as of this encounter: 83.5 kg.    Nutritional Assessment: Body mass index is 24.95 kg/m.SABRA Seen by dietician.  I agree with the assessment and plan as outlined below: Nutrition Status:        . Skin Assessment: I have examined the patient's skin and I agree with the wound assessment as performed by the wound care RN as outlined  below:    Consultants:  Neurology  Procedures:  None  Antimicrobials:  Anti-infectives (From admission, onward)    None         Subjective: Patient seen and examined.  Daughter at the bedside.  He has no complaints.  Objective: Vitals:   12/05/23 1605 12/05/23 2039 12/06/23 0500 12/06/23 0843  BP: (!) 116/59 112/62 (!)  141/70 121/73  Pulse: 86 85 74 84  Resp:  16 16 18   Temp: 98 F (36.7 C) 98.7 F (37.1 C) 98.1 F (36.7 C) 97.6 F (36.4 C)  TempSrc: Axillary Oral Oral Axillary  SpO2: 96% 97% 98% 98%  Weight:      Height:        Intake/Output Summary (Last 24 hours) at 12/06/2023 1047 Last data filed at 12/06/2023 0900 Gross per 24 hour  Intake 345.5 ml  Output 3000 ml  Net -2654.5 ml   Filed Weights   12/04/23 1139  Weight: 83.5 kg    Examination:  General exam: Appears calm and comfortable, left periorbital bruise. Respiratory system: Clear to auscultation. Respiratory effort normal. Cardiovascular system: S1 & S2 heard, RRR. No JVD, murmurs, rubs, gallops or clicks. No pedal edema. Gastrointestinal system: Abdomen is nondistended, soft and nontender. No organomegaly or masses felt. Normal bowel sounds heard. Central nervous system: Alert and oriented.  Very slight weakness in the left lower extremity.  Slight improvement compared to yesterday. Extremities: Symmetric 5 x 5 power. Skin: No rashes, lesions or ulcers.  Psychiatry: Judgement and insight appear normal. Mood & affect appropriate.    Data Reviewed: I have personally reviewed following labs and imaging studies  CBC: Recent Labs  Lab 12/04/23 1256 12/06/23 0318  WBC 11.3* 9.1  NEUTROABS 8.2* 6.4  HGB 10.8* 9.5*  HCT 33.8* 28.6*  MCV 99.4 96.3  PLT 207 168   Basic Metabolic Panel: Recent Labs  Lab 12/04/23 1256 12/06/23 0318  NA 137 136  K 4.9 4.6  CL 102 106  CO2 25 22  GLUCOSE 101* 115*  BUN 58* 52*  CREATININE 3.02* 2.56*  CALCIUM  8.7* 7.9*   GFR: Estimated Creatinine Clearance: 19.8 mL/min (A) (by C-G formula based on SCr of 2.56 mg/dL (H)). Liver Function Tests: Recent Labs  Lab 12/04/23 1256  AST 18  ALT 13  ALKPHOS 50  BILITOT 1.0  PROT 5.4*  ALBUMIN  2.9*   No results for input(s): LIPASE, AMYLASE in the last 168 hours. No results for input(s): AMMONIA in the last 168  hours. Coagulation Profile: No results for input(s): INR, PROTIME in the last 168 hours. Cardiac Enzymes: No results for input(s): CKTOTAL, CKMB, CKMBINDEX, TROPONINI in the last 168 hours. BNP (last 3 results) No results for input(s): PROBNP in the last 8760 hours. HbA1C: Recent Labs    12/05/23 0435  HGBA1C 6.3*   CBG: No results for input(s): GLUCAP in the last 168 hours. Lipid Profile: Recent Labs    12/05/23 0435  CHOL 119  HDL 55  LDLCALC 49  TRIG 76  CHOLHDL 2.2   Thyroid  Function Tests: No results for input(s): TSH, T4TOTAL, FREET4, T3FREE, THYROIDAB in the last 72 hours. Anemia Panel: No results for input(s): VITAMINB12, FOLATE, FERRITIN, TIBC, IRON, RETICCTPCT in the last 72 hours. Sepsis Labs: No results for input(s): PROCALCITON, LATICACIDVEN in the last 168 hours.  No results found for this or any previous visit (from the past 240 hours).   Radiology Studies: VAS US  CAROTID Result Date: 12/06/2023 Carotid Arterial Duplex Study Patient Name:  [name]  Date of Exam:   12/05/2023 Medical Rec #: [id number]     Accession #:    [id number] Date of Birth: December 15, 1927    Patient Gender: M Patient Age:   23 years Exam Location:  [facility] Procedure:      VAS US  CAROTID Referring Phys: NORMIE BUI --------------------------------------------------------------------------------  Indications:       Carotid artery disease and MRA 12/04/23 showed right ICA 70%                    stenosis or greater. Risk Factors:      Hypertension, hyperlipidemia, prior CVA. Other Factors:     COPD. Comparison Study:  02/15/2018 - Bilateral less than 50% stenosis. Performing Technologist: Ricka Sturdivant-Jones RDMS, RVT  Examination Guidelines: A complete evaluation includes B-mode imaging, spectral Doppler, color Doppler, and power Doppler as needed of all accessible portions of each vessel. Bilateral testing is considered an integral part of a complete  examination. Limited examinations for reoccurring indications may be performed as noted.  Right Carotid Findings: +----------+--------+--------+--------+------------------+------------------+           PSV cm/sEDV cm/sStenosisPlaque DescriptionComments           +----------+--------+--------+--------+------------------+------------------+ CCA Prox  78      18                                                   +----------+--------+--------+--------+------------------+------------------+ CCA Mid                                             intimal thickening +----------+--------+--------+--------+------------------+------------------+ CCA Distal59      18                                                   +----------+--------+--------+--------+------------------+------------------+ ICA Prox  129     41      40-59%  heterogenous                         +----------+--------+--------+--------+------------------+------------------+ ICA Mid   146     45      40-59%  homogeneous                          +----------+--------+--------+--------+------------------+------------------+ ICA Distal71      26                                                   +----------+--------+--------+--------+------------------+------------------+ ECA       126     17                                                   +----------+--------+--------+--------+------------------+------------------+ +----------+--------+-------+----------------+-------------------+  PSV cm/sEDV cmsDescribe        Arm Pressure (mmHG) +----------+--------+-------+----------------+-------------------+ [id number]             Multiphasic, WNL                    +----------+--------+-------+----------------+-------------------+ +---------+--------+--+--------+--+---------+ VertebralPSV cm/s51EDV cm/s14Antegrade +---------+--------+--+--------+--+---------+  Left Carotid Findings:  +----------+--------+--------+--------+------------------+------------------+           PSV cm/sEDV cm/sStenosisPlaque DescriptionComments           +----------+--------+--------+--------+------------------+------------------+ CCA Prox  97      17                                                   +----------+--------+--------+--------+------------------+------------------+ CCA Distal60      13                                intimal thickening +----------+--------+--------+--------+------------------+------------------+ ICA Prox  64      18      1-39%   calcific                             +----------+--------+--------+--------+------------------+------------------+ ICA Mid   68      20                                                   +----------+--------+--------+--------+------------------+------------------+ ICA Distal72      26                                                   +----------+--------+--------+--------+------------------+------------------+ ECA       88      14                                                   +----------+--------+--------+--------+------------------+------------------+ +----------+--------+--------+----------------+-------------------+           PSV cm/sEDV cm/sDescribe        Arm Pressure (mmHG) +----------+--------+--------+----------------+-------------------+ [id number]             Multiphasic, WNL                    +----------+--------+--------+----------------+-------------------+ +---------+--------+--+--------+--+---------+ VertebralPSV cm/s56EDV cm/s19Antegrade +---------+--------+--+--------+--+---------+   Summary: Right Carotid: Velocities in the right ICA are consistent with a 40-59%                stenosis. Left Carotid: Velocities in the left ICA are consistent with a 1-39% stenosis. Vertebrals:  Bilateral vertebral arteries demonstrate antegrade flow. Subclavians: Normal flow hemodynamics were  seen in bilateral subclavian              arteries. *See table(s) above for measurements and observations.  Electronically signed by Eather Popp MD on 12/06/2023 at 7:55:46 AM.    Final    ECHOCARDIOGRAM COMPLETE Result Date: 12/05/2023    ECHOCARDIOGRAM REPORT  Patient Name:   RHYLAND HINDERLITER Date of Exam: 12/05/2023 Medical Rec #:  [id number]    Height:       72.0 in Accession #:    [id number]   Weight:       184.0 lb Date of Birth:  [birth year]   BSA:          2.056 m Patient Age:    89 years     BP:           122/86 mmHg Patient Gender: M            HR:           87 bpm. Exam Location:  Inpatient Procedure: 2D Echo, Cardiac Doppler and Color Doppler (Both Spectral and Color            Flow Doppler were utilized during procedure). Indications:    Stroke  History:        Patient has prior history of Echocardiogram examinations, most                 recent 11/18/2021. COPD and PAD; Risk Factors:Hypertension and                 Dyslipidemia. CKD STAGE 4.  Sonographer:    Philomena DARING Referring Phys: [id number] MARSHA ADA IMPRESSIONS  1. Left ventricular ejection fraction, by estimation, is 60 to 65%. The left ventricle has normal function. The left ventricle has no regional wall motion abnormalities. Left ventricular diastolic parameters are consistent with Grade I diastolic dysfunction (impaired relaxation).  2. Right ventricular systolic function is normal. The right ventricular size is normal.  3. Restricted posterior leaflet. The mitral valve is normal in structure. No evidence of mitral valve regurgitation. Mild mitral stenosis. The mean mitral valve gradient is 4.0 mmHg.  4. The aortic valve is calcified. There is mild calcification of the aortic valve. There is moderate thickening of the aortic valve. Aortic valve regurgitation is not visualized. Aortic valve sclerosis is present, with no evidence of aortic valve stenosis. Aortic valve mean gradient measures 5.0 mmHg. Aortic valve Vmax measures 1.61 m/s.  5. The  inferior vena cava is normal in size with greater than 50% respiratory variability, suggesting right atrial pressure of 3 mmHg.  6. Agitated saline contrast bubble study was negative, with no evidence of any interatrial shunt. Conclusion(s)/Recommendation(s): No intracardiac source of embolism detected on this transthoracic study. Consider a transesophageal echocardiogram to exclude cardiac source of embolism if clinically indicated. FINDINGS  Left Ventricle: Left ventricular ejection fraction, by estimation, is 60 to 65%. The left ventricle has normal function. The left ventricle has no regional wall motion abnormalities. The left ventricular internal cavity size was normal in size. There is  no left ventricular hypertrophy. Left ventricular diastolic parameters are consistent with Grade I diastolic dysfunction (impaired relaxation). Right Ventricle: The right ventricular size is normal. No increase in right ventricular wall thickness. Right ventricular systolic function is normal. Left Atrium: Left atrial size was normal in size. Right Atrium: Right atrial size was normal in size. Pericardium: There is no evidence of pericardial effusion. Mitral Valve: Restricted posterior leaflet. The mitral valve is normal in structure. There is moderate thickening of the mitral valve leaflet(s). There is moderate calcification of the mitral valve leaflet(s). Mildly decreased mobility of the mitral valve leaflets. No evidence of mitral valve regurgitation. Mild mitral valve stenosis. MV peak gradient, 8.5 mmHg. The mean mitral valve gradient is 4.0 mmHg. Tricuspid Valve: The tricuspid valve  is normal in structure. Tricuspid valve regurgitation is not demonstrated. No evidence of tricuspid stenosis. Aortic Valve: The aortic valve is calcified. There is mild calcification of the aortic valve. There is moderate thickening of the aortic valve. Aortic valve regurgitation is not visualized. Aortic valve sclerosis is present, with no  evidence of aortic valve stenosis. Aortic valve mean gradient measures 5.0 mmHg. Aortic valve peak gradient measures 10.4 mmHg. Aortic valve area, by VTI measures 3.09 cm. Pulmonic Valve: The pulmonic valve was normal in structure. Pulmonic valve regurgitation is not visualized. No evidence of pulmonic stenosis. Aorta: The aortic root is normal in size and structure. Venous: The inferior vena cava is normal in size with greater than 50% respiratory variability, suggesting right atrial pressure of 3 mmHg. IAS/Shunts: No atrial level shunt detected by color flow Doppler. Agitated saline contrast was given intravenously to evaluate for intracardiac shunting. Agitated saline contrast bubble study was negative, with no evidence of any interatrial shunt. There  is no evidence of a patent foramen ovale.  LEFT VENTRICLE PLAX 2D LVIDd:         3.93 cm   Diastology LVIDs:         2.45 cm   LV e' medial:    6.85 cm/s LV PW:         0.71 cm   LV E/e' medial:  16.5 LV IVS:        0.93 cm   LV e' lateral:   7.72 cm/s LVOT diam:     2.05 cm   LV E/e' lateral: 14.6 LV SV:         86 LV SV Index:   42 LVOT Area:     3.30 cm  RIGHT VENTRICLE             IVC RV S prime:     15.00 cm/s  IVC diam: 2.14 cm TAPSE (M-mode): 2.2 cm LEFT ATRIUM             Index        RIGHT ATRIUM           Index LA diam:        3.07 cm 1.49 cm/m   RA Area:     10.10 cm LA Vol (A2C):   23.4 ml 11.38 ml/m  RA Volume:   19.40 ml  9.43 ml/m LA Vol (A4C):   23.3 ml 11.33 ml/m LA Biplane Vol: 24.2 ml 11.77 ml/m  AORTIC VALVE AV Area (Vmax):    2.69 cm AV Area (Vmean):   2.86 cm AV Area (VTI):     3.09 cm AV Vmax:           161.00 cm/s AV Vmean:          103.000 cm/s AV VTI:            0.280 m AV Peak Grad:      10.4 mmHg AV Mean Grad:      5.0 mmHg LVOT Vmax:         131.00 cm/s LVOT Vmean:        89.200 cm/s LVOT VTI:          0.262 m LVOT/AV VTI ratio: 0.94  AORTA Ao Root diam: 2.83 cm Ao Asc diam:  3.51 cm MITRAL VALVE MV Area (PHT): 3.48 cm      SHUNTS MV Area VTI:   2.35 cm     Systemic VTI:  0.26 m MV Peak grad:  8.5 mmHg  Systemic Diam: 2.05 cm MV Mean grad:  4.0 mmHg MV Vmax:       1.46 m/s MV Vmean:      93.7 cm/s MV Decel Time: 218 msec MV E velocity: 113.00 cm/s MV A velocity: 149.00 cm/s MV E/A ratio:  0.76 Oneil Parchment MD Electronically signed by Oneil Parchment MD Signature Date/Time: 12/05/2023/1:39:26 PM    Final    MR Angiogram Neck W or Wo Contrast Result Date: 12/04/2023 EXAM: MRA Neck without and with contrast 12/04/2023 05:26:26 PM TECHNIQUE: Multiplanar multisequence MRA of the neck was performed without and with the administration of intravenous contrast. 2D and 3D reformatted images are provided for review. Stenosis of the internal carotid arteries is measured using NASCET criteria. COMPARISON: None available CLINICAL HISTORY: Stroke/TIA, determine embolic source; Concern for watershed stroke. Radiology recommended MRA head and neck due to AKI. FINDINGS: CAROTID ARTERIES: Some slight images demonstrate asymmetric flow disturbance in the proximal right internal carotid artery. Irregular posterior atherosclerotic plaque is present along the proximal right ICA. 70 plus percent stenosis is present. No significant stenosis is present at the left ICA. Mild irregularity is present in the cavernous internal carotid arteries bilaterally. VERTEBRAL ARTERIES: Moderate focal stenosis is present in the distal left vertebral artery at the vertebral basilar junction. No dissection. IMPRESSION: 1. Irregular posterior atherosclerotic plaque in the proximal right ICA with 70+% stenosis relative to the more distal vessel . 2. Moderate focal stenosis in the distal left vertebral artery at the vertebrobasilar junction. 3. Mild irregularity in the cavernous internal carotid arteries bilaterally. Electronically signed by: Lonni Necessary MD 12/04/2023 05:42 PM EDT RP Workstation: [id number]   MR ANGIO HEAD WO CONTRAST Result Date: 12/04/2023 EXAM: MR  Angiography Head without intravenous Contrast. 12/04/2023 05:26:26 PM TECHNIQUE: Magnetic resonance angiography images of the head without intravenous contrast. Multiplanar 2D and 3D reformatted images are provided for review. COMPARISON: MR head 12/04/2023. CLINICAL HISTORY: Stroke/TIA, determine embolic source; Concern for watershed stroke. Radiology recommended MRA head and neck due to AKI. FINDINGS: ANTERIOR CIRCULATION: Mild atherosclerotic changes are present in the cavernous and supraclinoid internal carotid arteries bilaterally without significant stenoses. No significant stenosis of the anterior cerebral arteries. No significant stenosis of the middle cerebral arteries. No aneurysm. POSTERIOR CIRCULATION: No significant stenosis of the posterior cerebral arteries. No significant stenosis of the basilar artery. No significant stenosis of the vertebral arteries. No aneurysm. IMPRESSION: 1. No significant stenosis of the intracranial vasculature. 2. Mild atherosclerotic changes in the cavernous and supraclinoid internal carotid arteries bilaterally. Electronically signed by: Lonni Necessary MD 12/04/2023 05:39 PM EDT RP Workstation: [id number]   DG Chest Portable 1 View Result Date: 12/04/2023 EXAM: 1 VIEW XRAY OF THE CHEST 12/04/2023 04:27:00 PM COMPARISON: 11/22/2023 CLINICAL HISTORY: covid +, fatigue, falls, urinary retention, aki. Reason for exam: covid +, fatigue, falls, urinary retention, aki; Triage notes: ; Pt from home and home health c/o left sided weakness. Pt states he can not urinate. Denies left sided weakness. Axox4. VSS. FINDINGS: LUNGS AND PLEURA: No focal pulmonary opacity. No pulmonary edema. No pleural effusion. No pneumothorax. HEART AND MEDIASTINUM: No acute abnormality of the cardiac and mediastinal silhouettes. BONES AND SOFT TISSUES: No acute osseous abnormality. VASCULATURE: Aortic atherosclerosis. IMPRESSION: 1. No acute findings. Electronically signed by: Selinda Blue MD  12/04/2023 04:54 PM EDT RP Workstation: [id number]   MR LUMBAR SPINE WO CONTRAST Result Date: 12/04/2023 EXAM: MRI LUMBAR SPINE 12/04/2023 02:53:59 PM TECHNIQUE: Multiplanar multisequence MRI of the lumbar spine was performed without the administration of intravenous  contrast. COMPARISON: MRI of the lumbar spine 07/12/2014. CLINICAL HISTORY: Low back pain, trauma; Left leg weakness that is new. Recent fall. Also urinary retention that is new. Pt from home and home health c/o left sided weakness. Pt states he can not urinate. Denies left sided weakness. Axox4. VSS. FINDINGS: BONES AND ALIGNMENT: Mild leftward curvature is centered at L2-3. Slight retrolisthesis at L2-3 is stable. Grade 1 anterolisthesis at L4-5 has progressed. SPINAL CORD: The conus terminates normally. SOFT TISSUES: A 4.3 cm simple cyst is present at the lower pole of the right kidney. Left renal atrophy is new since the prior study. Atherosclerotic changes are present within the abdominal aorta. Maximal diameter is 31 mm. L1-L2: Mild leftward disc protrusion at L1-2 is new without significant stenosis. L2-L3: A broad-based disc protrusion, asymmetric to the right, and moderate bilateral facet hypertrophy has progressed at L2-3. Moderate right and mild left subarticular stenosis is present. Mild right foraminal narrowing is present. L3-L4: A leftward broad-based disc protrusion is similar to prior study. Mild left subarticular and foraminal stenosis is stable. L4-L5: Progressive facet hypertrophy is present bilaterally. Moderate subarticular and left foraminal narrowing is similar to the prior study. L5-S1: Mild facet hypertrophy is present at L5 to S1. No focal disc protrusion or stenosis is present. IMPRESSION: 1. Progressed grade 1 anterolisthesis at L4-5. 2. New mild leftward disc protrusion at L1-2 without significant stenosis. 3. Progressed broad-based disc protrusion at L2-3 with moderate right and mild left subarticular stenosis and mild  right foraminal narrowing. 4. Stable leftward broad-based disc protrusion at L3-4 with mild left subarticular and foraminal stenosis. 5. Moderate subarticular and left foraminal narrowing at L4-5, similar to prior study. Electronically signed by: Lonni Necessary MD 12/04/2023 03:40 PM EDT RP Workstation: [id number]   MR BRAIN WO CONTRAST Result Date: 12/04/2023 EXAM: MR Brain Without Intravenous Contrast. CLINICAL HISTORY: Head trauma, minor (Age >= 65y). Pt from home and home health c/o left sided weakness. Pt states he can not urinate. Denies left sided weakness. Axox4. VSS. TECHNIQUE: Magnetic resonance images of the brain without intravenous contrast in multiple planes. CONTRAST: None. COMPARISON: CT head without contrast 12/01/2023. FINDINGS: BRAIN: Scattered foci of acute/subacute nonhemorrhagic infarcts are present in a right watershed distribution. Infarcts are present within the right precentral gyrus. More confluent areas of infarction are present anteriorly in the right middle frontal gyrus and in the lateral right occipital lobe. A single focus of acute/subacute infarction is present anteriorly in the left frontal lobe. White matter changes extend into the brainstem. Remote lacunar infarcts are present within the basal ganglia and thalami bilaterally. Remote lacunar infarcts are present in the right cerebral cerebellar hemisphere. Susceptibility artifact are present in the anterior frontal lobes bilaterally. VENTRICLES: No hydrocephalus. ORBITS: CALL MEASURES CURRENT ER CALL MEASURES CURRENT EMERGENCY DEPARTMENT Bilateral lens replacements are noted. The globes and orbits are otherwise within normal limits. SINUSES AND MASTOIDS: Moderate mucosal thickening is present in the anterior ethmoid air cells bilaterally. The right frontal sinus is near completely opacified. The left frontal sinus is hypoplastic. A small right mastoid effusion is present. No obstructing nasopharyngeal lesion is present.  BONES: No acute fracture or focal osseous lesion. IMPRESSION: 1. Scattered foci of acute/subacute nonhemorrhagic infarcts in a right watershed distribution, including the right precentral gyrus, right middle frontal gyrus, and lateral right occipital lobe. This suggests either right carotid stenosis or right-sided embolic disease 2. A single focus of acute/subacute infarction is present anteriorly in the left frontal lobe. 3. Remote lacunar infarcts in  the basal ganglia and thalami bilaterally, as well as in the right cerebral cerebellar hemisphere. 4. Moderate mucosal thickening in the anterior ethmoid air cells bilaterally and near complete opacification of the right frontal sinus. 5. Small right mastoid effusion. Findings were called to Dr. Ginger at 3:30pm Electronically signed by: Lonni Necessary MD 12/04/2023 03:34 PM EDT RP Workstation: [id number]    Scheduled Meds:  aspirin  EC  81 mg Oral Daily   budesonide -glycopyrrolate -formoterol   2 puff Inhalation BID   Chlorhexidine  Gluconate Cloth  6 each Topical Q0600   clopidogrel   75 mg Oral Daily   diclofenac  Sodium  2 g Topical QID   pantoprazole   40 mg Oral Daily   rosuvastatin   10 mg Oral Daily   senna-docusate  1 tablet Oral QHS   tamsulosin   0.4 mg Oral QPC supper   Continuous Infusions:  sodium chloride  125 mL/hr at 12/06/23 0942     LOS: 2 days   Fredia Skeeter, MD [facility]  12/06/2023, 10:47 AM   *Please note that this is a verbal dictation therefore any spelling or grammatical errors are due to the Dragon [employer] One system interpretation.  Please page via Amion and do not message via secure chat for urgent patient care matters. Secure chat can be used for non urgent patient care matters.  How to contact the [facility] Attending or Consulting provider 7A - 7P or covering provider during after hours 7P -7A, for this patient?  Check the care team in [facility] and look for a) attending/consulting [facility] provider listed and b) the  [facility] team listed. Page or secure chat 7A-7P. Log into [email or web address] and use [place]'s universal password to access. If you do not have the password, please contact the hospital operator. Locate the [facility] provider you are looking for under [facility] and page to a number that you can be directly reached. If you still have difficulty reaching the provider, please page the [facility] (Director on Call) for the Hospitalists listed on amion for assistance.

## 2023-12-07 DIAGNOSIS — I6340 Cerebral infarction due to embolism of unspecified cerebral artery: Secondary | ICD-10-CM | POA: Diagnosis not present

## 2023-12-07 DIAGNOSIS — I6521 Occlusion and stenosis of right carotid artery: Secondary | ICD-10-CM | POA: Insufficient documentation

## 2023-12-07 LAB — BASIC METABOLIC PANEL WITH GFR
Anion gap: 10 (ref 5–15)
BUN: 47 mg/dL — ABNORMAL HIGH (ref 8–23)
CO2: 21 mmol/L — ABNORMAL LOW (ref 22–32)
Calcium: 8 mg/dL — ABNORMAL LOW (ref 8.9–10.3)
Chloride: 104 mmol/L (ref 98–111)
Creatinine, Ser: 2.62 mg/dL — ABNORMAL HIGH (ref 0.61–1.24)
GFR, Estimated: 22 mL/min — ABNORMAL LOW (ref >60)
Glucose, Bld: 110 mg/dL — ABNORMAL HIGH (ref 70–99)
Potassium: 4.6 mmol/L (ref 3.5–5.1)
Sodium: 135 mmol/L (ref 135–145)

## 2023-12-07 LAB — GLUCOSE, CAPILLARY: Glucose-Capillary: 122 mg/dL — ABNORMAL HIGH (ref 70–99)

## 2023-12-07 MED ORDER — BISACODYL 5 MG PO TBEC
10.0000 mg | DELAYED_RELEASE_TABLET | Freq: Once | ORAL | Status: AC
  Administered 2023-12-07: 10 mg via ORAL
  Filled 2023-12-07: qty 2

## 2023-12-07 MED ORDER — ASPIRIN 81 MG PO TBEC: 81.0000 mg | DELAYED_RELEASE_TABLET | Freq: Every day | ORAL | 0 refills | Status: AC

## 2023-12-07 MED ORDER — CLOPIDOGREL BISULFATE 75 MG PO TABS: 75.0000 mg | ORAL_TABLET | Freq: Every day | ORAL | 0 refills | Status: AC

## 2023-12-07 MED ORDER — BISACODYL 10 MG RE SUPP
10.0000 mg | Freq: Once | RECTAL | Status: AC
  Administered 2023-12-07: 10 mg via RECTAL
  Filled 2023-12-07: qty 1

## 2023-12-07 NOTE — Discharge Summary (Signed)
 Physician Discharge Summary  Stephen Wilkins FMW:[id number] DOB: 1928/01/18 DOA: 12/04/2023  PCP: Norleen Lynwood ORN, MD  Admit date: 12/04/2023 Discharge date: 12/07/2023 30 Day Unplanned Readmission Risk Score    Flowsheet Row ED to Hosp-Admission (Current) from 12/04/2023 in [facility]  30 Day Unplanned Readmission Risk Score (%) 21.85 Filed at 12/07/2023 0801    This score is the patient's risk of an unplanned readmission within 30 days of being discharged (0 -100%). The score is based on dignosis, age, lab data, medications, orders, and past utilization.   Low:  0-14.9   Medium: 15-21.9   High: 22-29.9   Extreme: 30 and above          Admitted From: Home Disposition: SNF  Recommendations for Outpatient Follow-up:  Follow up with PCP in 1-2 weeks Please obtain BMP/CBC in one week Follow-up with neurology in 4 to 6 weeks Established relationship with vascular surgery in a month Please take both aspirin  and Plavix  for next 3 months and then stop Plavix  but continue to take aspirin  indefinitely. Please follow up with your PCP on the following pending results: Unresulted Labs (From admission, onward)    None         Home Health: None  Equipment/Devices: None  Discharge Condition: Stable CODE STATUS: DNR Diet recommendation:  Diet Order             Diet Heart Room service appropriate? Yes with Assist; Fluid consistency: Thin  Diet effective now                   Subjective: Patient seen and examined, no complaints, daughter at the bedside.  Patient says that his weakness is improving.  Plan of care and discharge discussed with them and they are in agreement.  Brief/Interim Summary: Stephen Wilkins is a 89 y.o. male with medical history significant of dementia, COPD, BPH, esophageal stricture, HTN, HLD, and diverticulosis presented with weakness in the left leg and inability to urinate.   The patient experienced a fall on Friday afternoon, resulting in a  gash on the head and a black eye. Following the fall, the patient was transported to [facility], where a CT scan was performed and pt eventually discharged home the same day. Upon returning home, the pt was unable to ambulate or move LLE; thus, he re-presented to the ED and MRI revealed the presence of a stroke.  Patient was also found to have AKI on CKD.  Admitted to hospitalist service, neurology consulted.  Details below.   Acute ischemic CVA: MRI brain shows scattered foci of acute/subacute nonhemorrhagic infarct in the right watershed distribution, including right precentral gyrus, right middle frontal gyrus and lateral right occipital lobe.  Also has infarct in the left frontal lobe and remote lacunar infarcts.  MRA neck shows 70% plus stenosis of the right ICA.  No intracranial stenosis.  Patient was seen by neurology, started on DAPT aspirin  and Plavix .  May need vascular intervention.  Doppler carotid shows 40 to 60% right ICA stenosis, no i vascular intervention is needed at the moment however recommend follow-up with vascular surgery outpatient for surveillance.  LDL only 49 but he remains on Crestor .  Echo shows normal ejection fraction with grade 1 diastolic dysfunction and no PFO.  PT OT recommended SNF, family and patient agreement with going to SNF once bed available, per TOC, he may have a bed tomorrow.  Neurology recommends continuing DAPT for 3 months and then aspirin  alone due  to large vessel disease and also recommended 30-day cardiac event monitoring and cardiology is aware of that.  It is also advised to him that he needs to establish relationship with vascular surgery for surveillance of right ICA stenosis.  I have personally discussed this with the patient.   Essential hypertension: PTA medication include losartan .  Needed to allow permissive hypertension as low blood pressure may cause further worsening of the stroke.  However patient's blood pressure is within normal range despite of holding  antihypertensives.  For that reason, I am going to discontinue his only antihypertensive medication/losartan  100 mg.   AKI on CKD stage IIIb: Baseline creatinine around 1.7 about 6 months ago however since 2 months, his creatinine has been around 2.4-2.5, presented with 2.41 which peaked at 3.02, improved down to 2.6 with IV fluids.  This is close to his baseline.  Patient does not appear to be dehydrated, eating drinking well.   BPH/urinary retention: Initially presented with urinary retention, he is now voiding.  Continue Flomax .   Prediabetes: Hemoglobin A1c only 6.3.    Discharge plan was discussed with patient and/or family member and they verbalized understanding and agreed with it.  Discharge Diagnoses:  Principal Problem:   Stroke [facility]) Active Problems:   Hyperlipidemia   Essential hypertension   ICAO (internal carotid artery occlusion), right    Discharge Instructions  Discharge Instructions     Ambulatory referral to Neurology   Complete by: As directed    Follow up with stroke clinic NP at [facility] in about 4-6 weeks. Thanks.      Allergies as of 12/07/2023       Reactions   Atorvastatin  Other (See Comments)   REACTION: myalgia   Doxazosin Mesylate Other (See Comments)   REACTION: dizziness   Hydrocodone  Bit-homatrop Mbr Other (See Comments)   anxiety   Hydrocodone  Anxiety        Medication List     STOP taking these medications    losartan  100 MG tablet Commonly known as: COZAAR        TAKE these medications    albuterol  (2.5 MG/3ML) 0.083% nebulizer solution Commonly known as: PROVENTIL  Take 3 mLs (2.5 mg total) by nebulization every 4 (four) hours as needed for wheezing or shortness of breath.   aspirin  EC 81 MG tablet Take 1 tablet (81 mg total) by mouth daily. Swallow whole. Start taking on: December 08, 2023   clopidogrel  75 MG tablet Commonly known as: PLAVIX  Take 1 tablet (75 mg total) by mouth daily. Start taking on: December 08, 2023    DONEPEZIL  HCL PO Take 1 tablet by mouth daily.   finasteride  5 MG tablet Commonly known as: PROSCAR  Take 5 mg by mouth daily.   levofloxacin  500 MG tablet Commonly known as: LEVAQUIN  Take 1 tablet (500 mg total) by mouth daily.   MULTIVITAMINS PO Take 1 tablet by mouth daily.   pantoprazole  40 MG tablet Commonly known as: PROTONIX  Take 1 tablet (40 mg total) by mouth daily.   polyethylene glycol powder 17 GM/SCOOP powder Commonly known as: MiraLax  Take 1/2 capful by mouth once daily What changed:  how much to take how to take this when to take this reasons to take this   rosuvastatin  10 MG tablet Commonly known as: CRESTOR  Take 1 tablet (10 mg total) by mouth daily.   solifenacin  5 MG tablet Commonly known as: VESICARE  Take 1 tablet (5 mg total) by mouth daily.   tamsulosin  0.4 MG Caps capsule Commonly known as:  FLOMAX  Take 1 capsule (0.4 mg total) by mouth in the morning and at bedtime.   Trelegy Ellipta  100-62.5-25 MCG/ACT Aepb Generic drug: Fluticasone -Umeclidin-Vilant Inhale 1 puff into the lungs daily.        Contact information for follow-up providers     [facility]. Schedule an appointment as soon as possible for a visit in 1 month(s).   Specialty: Neurology Why: stroke clinic Contact information: [address] [place]  [address] [phone]        Norleen Lynwood ORN, MD Follow up in 1 week(s).   Specialties: Internal Medicine, Radiology Contact information: [address] [place] [address] [phone]         [facility] Follow up in 1 month(s).   Specialty: Vascular Surgery Contact information: [address] [place]  [address] [phone]             Contact information for after-discharge care     Destination     [facility] .   Service: Skilled Nursing Contact  information: [address] [place]  [address] [phone]                    Allergies  Allergen Reactions   Atorvastatin  Other (See Comments)    REACTION: myalgia   Doxazosin Mesylate Other (See Comments)    REACTION: dizziness   Hydrocodone  Bit-Homatrop Mbr Other (See Comments)    anxiety   Hydrocodone  Anxiety    Consultations: Neurology   Procedures/Studies: VAS US  CAROTID Result Date: 12/06/2023 Carotid Arterial Duplex Study Patient Name:  Stephen Wilkins  Date of Exam:   12/05/2023 Medical Rec #: [id number]     Accession #:    [id number] Date of Birth: May 16, 1927    Patient Gender: M Patient Age:   89 years Exam Location:  [facility] Procedure:      VAS US  CAROTID Referring Phys: NORMIE BUI --------------------------------------------------------------------------------  Indications:       Carotid artery disease and MRA 12/04/23 showed right ICA 70%                    stenosis or greater. Risk Factors:      Hypertension, hyperlipidemia, prior CVA. Other Factors:     COPD. Comparison Study:  02/15/2018 - Bilateral less than 50% stenosis. Performing Technologist: Ricka Sturdivant-Jones RDMS, RVT  Examination Guidelines: A complete evaluation includes B-mode imaging, spectral Doppler, color Doppler, and power Doppler as needed of all accessible portions of each vessel. Bilateral testing is considered an integral part of a complete examination. Limited examinations for reoccurring indications may be performed as noted.  Right Carotid Findings: +----------+--------+--------+--------+------------------+------------------+           PSV cm/sEDV cm/sStenosisPlaque DescriptionComments           +----------+--------+--------+--------+------------------+------------------+ CCA Prox  78      18                                                   +----------+--------+--------+--------+------------------+------------------+ CCA Mid  intimal thickening +----------+--------+--------+--------+------------------+------------------+ CCA Distal59      18                                                   +----------+--------+--------+--------+------------------+------------------+ ICA Prox  129     41      40-59%  heterogenous                         +----------+--------+--------+--------+------------------+------------------+ ICA Mid   146     45      40-59%  homogeneous                          +----------+--------+--------+--------+------------------+------------------+ ICA Distal71      26                                                   +----------+--------+--------+--------+------------------+------------------+ ECA       126     17                                                   +----------+--------+--------+--------+------------------+------------------+ +----------+--------+-------+----------------+-------------------+           PSV cm/sEDV cmsDescribe        Arm Pressure (mmHG) +----------+--------+-------+----------------+-------------------+ [id number]             Multiphasic, WNL                    +----------+--------+-------+----------------+-------------------+ +---------+--------+--+--------+--+---------+ VertebralPSV cm/s51EDV cm/s14Antegrade +---------+--------+--+--------+--+---------+  Left Carotid Findings: +----------+--------+--------+--------+------------------+------------------+           PSV cm/sEDV cm/sStenosisPlaque DescriptionComments           +----------+--------+--------+--------+------------------+------------------+ CCA Prox  97      17                                                   +----------+--------+--------+--------+------------------+------------------+ CCA Distal60      13                                intimal thickening +----------+--------+--------+--------+------------------+------------------+ ICA Prox  64       18      1-39%   calcific                             +----------+--------+--------+--------+------------------+------------------+ ICA Mid   68      20                                                   +----------+--------+--------+--------+------------------+------------------+ ICA Distal72      26                                                   +----------+--------+--------+--------+------------------+------------------+  ECA       88      14                                                   +----------+--------+--------+--------+------------------+------------------+ +----------+--------+--------+----------------+-------------------+           PSV cm/sEDV cm/sDescribe        Arm Pressure (mmHG) +----------+--------+--------+----------------+-------------------+ Subclavian127             Multiphasic, WNL                    +----------+--------+--------+----------------+-------------------+ +---------+--------+--+--------+--+---------+ VertebralPSV cm/s56EDV cm/s19Antegrade +---------+--------+--+--------+--+---------+   Summary: Right Carotid: Velocities in the right ICA are consistent with a 40-59%                stenosis. Left Carotid: Velocities in the left ICA are consistent with a 1-39% stenosis. Vertebrals:  Bilateral vertebral arteries demonstrate antegrade flow. Subclavians: Normal flow hemodynamics were seen in bilateral subclavian              arteries. *See table(s) above for measurements and observations.  Electronically signed by Eather Popp MD on 12/06/2023 at 7:55:46 AM.    Final    ECHOCARDIOGRAM COMPLETE Result Date: 12/05/2023    ECHOCARDIOGRAM REPORT   Patient Name:   Stephen Wilkins Date of Exam: 12/05/2023 Medical Rec #:  [id number]    Height:       72.0 in Accession #:    [id number]   Weight:       184.0 lb Date of Birth:  04-Jul-1927   BSA:          2.056 m Patient Age:    89 years     BP:           122/86 mmHg Patient Gender: M            HR:            87 bpm. Exam Location:  Inpatient Procedure: 2D Echo, Cardiac Doppler and Color Doppler (Both Spectral and Color            Flow Doppler were utilized during procedure). Indications:    Stroke  History:        Patient has prior history of Echocardiogram examinations, most                 recent 11/18/2021. COPD and PAD; Risk Factors:Hypertension and                 Dyslipidemia. CKD STAGE 4.  Sonographer:    Philomena DARING Referring Phys: [id number] MARSHA ADA IMPRESSIONS  1. Left ventricular ejection fraction, by estimation, is 60 to 65%. The left ventricle has normal function. The left ventricle has no regional wall motion abnormalities. Left ventricular diastolic parameters are consistent with Grade I diastolic dysfunction (impaired relaxation).  2. Right ventricular systolic function is normal. The right ventricular size is normal.  3. Restricted posterior leaflet. The mitral valve is normal in structure. No evidence of mitral valve regurgitation. Mild mitral stenosis. The mean mitral valve gradient is 4.0 mmHg.  4. The aortic valve is calcified. There is mild calcification of the aortic valve. There is moderate thickening of the aortic valve. Aortic valve regurgitation is not visualized. Aortic valve sclerosis is present, with no evidence of aortic valve stenosis. Aortic valve mean gradient measures  5.0 mmHg. Aortic valve Vmax measures 1.61 m/s.  5. The inferior vena cava is normal in size with greater than 50% respiratory variability, suggesting right atrial pressure of 3 mmHg.  6. Agitated saline contrast bubble study was negative, with no evidence of any interatrial shunt. Conclusion(s)/Recommendation(s): No intracardiac source of embolism detected on this transthoracic study. Consider a transesophageal echocardiogram to exclude cardiac source of embolism if clinically indicated. FINDINGS  Left Ventricle: Left ventricular ejection fraction, by estimation, is 60 to 65%. The left ventricle has normal  function. The left ventricle has no regional wall motion abnormalities. The left ventricular internal cavity size was normal in size. There is  no left ventricular hypertrophy. Left ventricular diastolic parameters are consistent with Grade I diastolic dysfunction (impaired relaxation). Right Ventricle: The right ventricular size is normal. No increase in right ventricular wall thickness. Right ventricular systolic function is normal. Left Atrium: Left atrial size was normal in size. Right Atrium: Right atrial size was normal in size. Pericardium: There is no evidence of pericardial effusion. Mitral Valve: Restricted posterior leaflet. The mitral valve is normal in structure. There is moderate thickening of the mitral valve leaflet(s). There is moderate calcification of the mitral valve leaflet(s). Mildly decreased mobility of the mitral valve leaflets. No evidence of mitral valve regurgitation. Mild mitral valve stenosis. MV peak gradient, 8.5 mmHg. The mean mitral valve gradient is 4.0 mmHg. Tricuspid Valve: The tricuspid valve is normal in structure. Tricuspid valve regurgitation is not demonstrated. No evidence of tricuspid stenosis. Aortic Valve: The aortic valve is calcified. There is mild calcification of the aortic valve. There is moderate thickening of the aortic valve. Aortic valve regurgitation is not visualized. Aortic valve sclerosis is present, with no evidence of aortic valve stenosis. Aortic valve mean gradient measures 5.0 mmHg. Aortic valve peak gradient measures 10.4 mmHg. Aortic valve area, by VTI measures 3.09 cm. Pulmonic Valve: The pulmonic valve was normal in structure. Pulmonic valve regurgitation is not visualized. No evidence of pulmonic stenosis. Aorta: The aortic root is normal in size and structure. Venous: The inferior vena cava is normal in size with greater than 50% respiratory variability, suggesting right atrial pressure of 3 mmHg. IAS/Shunts: No atrial level shunt detected by  color flow Doppler. Agitated saline contrast was given intravenously to evaluate for intracardiac shunting. Agitated saline contrast bubble study was negative, with no evidence of any interatrial shunt. There  is no evidence of a patent foramen ovale.  LEFT VENTRICLE PLAX 2D LVIDd:         3.93 cm   Diastology LVIDs:         2.45 cm   LV e' medial:    6.85 cm/s LV PW:         0.71 cm   LV E/e' medial:  16.5 LV IVS:        0.93 cm   LV e' lateral:   7.72 cm/s LVOT diam:     2.05 cm   LV E/e' lateral: 14.6 LV SV:         86 LV SV Index:   42 LVOT Area:     3.30 cm  RIGHT VENTRICLE             IVC RV S prime:     15.00 cm/s  IVC diam: 2.14 cm TAPSE (M-mode): 2.2 cm LEFT ATRIUM             Index        RIGHT ATRIUM  Index LA diam:        3.07 cm 1.49 cm/m   RA Area:     10.10 cm LA Vol (A2C):   23.4 ml 11.38 ml/m  RA Volume:   19.40 ml  9.43 ml/m LA Vol (A4C):   23.3 ml 11.33 ml/m LA Biplane Vol: 24.2 ml 11.77 ml/m  AORTIC VALVE AV Area (Vmax):    2.69 cm AV Area (Vmean):   2.86 cm AV Area (VTI):     3.09 cm AV Vmax:           161.00 cm/s AV Vmean:          103.000 cm/s AV VTI:            0.280 m AV Peak Grad:      10.4 mmHg AV Mean Grad:      5.0 mmHg LVOT Vmax:         131.00 cm/s LVOT Vmean:        89.200 cm/s LVOT VTI:          0.262 m LVOT/AV VTI ratio: 0.94  AORTA Ao Root diam: 2.83 cm Ao Asc diam:  3.51 cm MITRAL VALVE MV Area (PHT): 3.48 cm     SHUNTS MV Area VTI:   2.35 cm     Systemic VTI:  0.26 m MV Peak grad:  8.5 mmHg     Systemic Diam: 2.05 cm MV Mean grad:  4.0 mmHg MV Vmax:       1.46 m/s MV Vmean:      93.7 cm/s MV Decel Time: 218 msec MV E velocity: 113.00 cm/s MV A velocity: 149.00 cm/s MV E/A ratio:  0.76 Oneil Parchment MD Electronically signed by Oneil Parchment MD Signature Date/Time: 12/05/2023/1:39:26 PM    Final    MR Angiogram Neck W or Wo Contrast Result Date: 12/04/2023 EXAM: MRA Neck without and with contrast 12/04/2023 05:26:26 PM TECHNIQUE: Multiplanar multisequence MRA of  the neck was performed without and with the administration of intravenous contrast. 2D and 3D reformatted images are provided for review. Stenosis of the internal carotid arteries is measured using NASCET criteria. COMPARISON: None available CLINICAL HISTORY: Stroke/TIA, determine embolic source; Concern for watershed stroke. Radiology recommended MRA head and neck due to AKI. FINDINGS: CAROTID ARTERIES: Some slight images demonstrate asymmetric flow disturbance in the proximal right internal carotid artery. Irregular posterior atherosclerotic plaque is present along the proximal right ICA. 70 plus percent stenosis is present. No significant stenosis is present at the left ICA. Mild irregularity is present in the cavernous internal carotid arteries bilaterally. VERTEBRAL ARTERIES: Moderate focal stenosis is present in the distal left vertebral artery at the vertebral basilar junction. No dissection. IMPRESSION: 1. Irregular posterior atherosclerotic plaque in the proximal right ICA with 70+% stenosis relative to the more distal vessel . 2. Moderate focal stenosis in the distal left vertebral artery at the vertebrobasilar junction. 3. Mild irregularity in the cavernous internal carotid arteries bilaterally. Electronically signed by: Lonni Necessary MD 12/04/2023 05:42 PM EDT RP Workstation: [id number]   MR ANGIO HEAD WO CONTRAST Result Date: 12/04/2023 EXAM: MR Angiography Head without intravenous Contrast. 12/04/2023 05:26:26 PM TECHNIQUE: Magnetic resonance angiography images of the head without intravenous contrast. Multiplanar 2D and 3D reformatted images are provided for review. COMPARISON: MR head 12/04/2023. CLINICAL HISTORY: Stroke/TIA, determine embolic source; Concern for watershed stroke. Radiology recommended MRA head and neck due to AKI. FINDINGS: ANTERIOR CIRCULATION: Mild atherosclerotic changes are present in the cavernous and supraclinoid internal carotid arteries  bilaterally without  significant stenoses. No significant stenosis of the anterior cerebral arteries. No significant stenosis of the middle cerebral arteries. No aneurysm. POSTERIOR CIRCULATION: No significant stenosis of the posterior cerebral arteries. No significant stenosis of the basilar artery. No significant stenosis of the vertebral arteries. No aneurysm. IMPRESSION: 1. No significant stenosis of the intracranial vasculature. 2. Mild atherosclerotic changes in the cavernous and supraclinoid internal carotid arteries bilaterally. Electronically signed by: Lonni Necessary MD 12/04/2023 05:39 PM EDT RP Workstation: [id number]   DG Chest Portable 1 View Result Date: 12/04/2023 EXAM: 1 VIEW XRAY OF THE CHEST 12/04/2023 04:27:00 PM COMPARISON: 11/22/2023 CLINICAL HISTORY: covid +, fatigue, falls, urinary retention, aki. Reason for exam: covid +, fatigue, falls, urinary retention, aki; Triage notes: ; Pt from home and home health c/o left sided weakness. Pt states he can not urinate. Denies left sided weakness. Axox4. VSS. FINDINGS: LUNGS AND PLEURA: No focal pulmonary opacity. No pulmonary edema. No pleural effusion. No pneumothorax. HEART AND MEDIASTINUM: No acute abnormality of the cardiac and mediastinal silhouettes. BONES AND SOFT TISSUES: No acute osseous abnormality. VASCULATURE: Aortic atherosclerosis. IMPRESSION: 1. No acute findings. Electronically signed by: Selinda Blue MD 12/04/2023 04:54 PM EDT RP Workstation: [id number]   MR LUMBAR SPINE WO CONTRAST Result Date: 12/04/2023 EXAM: MRI LUMBAR SPINE 12/04/2023 02:53:59 PM TECHNIQUE: Multiplanar multisequence MRI of the lumbar spine was performed without the administration of intravenous contrast. COMPARISON: MRI of the lumbar spine 07/12/2014. CLINICAL HISTORY: Low back pain, trauma; Left leg weakness that is new. Recent fall. Also urinary retention that is new. Pt from home and home health c/o left sided weakness. Pt states he can not urinate. Denies left sided  weakness. Axox4. VSS. FINDINGS: BONES AND ALIGNMENT: Mild leftward curvature is centered at L2-3. Slight retrolisthesis at L2-3 is stable. Grade 1 anterolisthesis at L4-5 has progressed. SPINAL CORD: The conus terminates normally. SOFT TISSUES: A 4.3 cm simple cyst is present at the lower pole of the right kidney. Left renal atrophy is new since the prior study. Atherosclerotic changes are present within the abdominal aorta. Maximal diameter is 31 mm. L1-L2: Mild leftward disc protrusion at L1-2 is new without significant stenosis. L2-L3: A broad-based disc protrusion, asymmetric to the right, and moderate bilateral facet hypertrophy has progressed at L2-3. Moderate right and mild left subarticular stenosis is present. Mild right foraminal narrowing is present. L3-L4: A leftward broad-based disc protrusion is similar to prior study. Mild left subarticular and foraminal stenosis is stable. L4-L5: Progressive facet hypertrophy is present bilaterally. Moderate subarticular and left foraminal narrowing is similar to the prior study. L5-S1: Mild facet hypertrophy is present at L5 to S1. No focal disc protrusion or stenosis is present. IMPRESSION: 1. Progressed grade 1 anterolisthesis at L4-5. 2. New mild leftward disc protrusion at L1-2 without significant stenosis. 3. Progressed broad-based disc protrusion at L2-3 with moderate right and mild left subarticular stenosis and mild right foraminal narrowing. 4. Stable leftward broad-based disc protrusion at L3-4 with mild left subarticular and foraminal stenosis. 5. Moderate subarticular and left foraminal narrowing at L4-5, similar to prior study. Electronically signed by: Lonni Necessary MD 12/04/2023 03:40 PM EDT RP Workstation: [id number]   MR BRAIN WO CONTRAST Result Date: 12/04/2023 EXAM: MR Brain Without Intravenous Contrast. CLINICAL HISTORY: Head trauma, minor (Age >= 65y). Pt from home and home health c/o left sided weakness. Pt states he can not urinate.  Denies left sided weakness. Axox4. VSS. TECHNIQUE: Magnetic resonance images of the brain without intravenous contrast in multiple planes. CONTRAST:  None. COMPARISON: CT head without contrast 12/01/2023. FINDINGS: BRAIN: Scattered foci of acute/subacute nonhemorrhagic infarcts are present in a right watershed distribution. Infarcts are present within the right precentral gyrus. More confluent areas of infarction are present anteriorly in the right middle frontal gyrus and in the lateral right occipital lobe. A single focus of acute/subacute infarction is present anteriorly in the left frontal lobe. White matter changes extend into the brainstem. Remote lacunar infarcts are present within the basal ganglia and thalami bilaterally. Remote lacunar infarcts are present in the right cerebral cerebellar hemisphere. Susceptibility artifact are present in the anterior frontal lobes bilaterally. VENTRICLES: No hydrocephalus. ORBITS: CALL MEASURES CURRENT ER CALL MEASURES CURRENT EMERGENCY DEPARTMENT Bilateral lens replacements are noted. The globes and orbits are otherwise within normal limits. SINUSES AND MASTOIDS: Moderate mucosal thickening is present in the anterior ethmoid air cells bilaterally. The right frontal sinus is near completely opacified. The left frontal sinus is hypoplastic. A small right mastoid effusion is present. No obstructing nasopharyngeal lesion is present. BONES: No acute fracture or focal osseous lesion. IMPRESSION: 1. Scattered foci of acute/subacute nonhemorrhagic infarcts in a right watershed distribution, including the right precentral gyrus, right middle frontal gyrus, and lateral right occipital lobe. This suggests either right carotid stenosis or right-sided embolic disease 2. A single focus of acute/subacute infarction is present anteriorly in the left frontal lobe. 3. Remote lacunar infarcts in the basal ganglia and thalami bilaterally, as well as in the right cerebral cerebellar  hemisphere. 4. Moderate mucosal thickening in the anterior ethmoid air cells bilaterally and near complete opacification of the right frontal sinus. 5. Small right mastoid effusion. Findings were called to Dr. Ginger at 3:30pm Electronically signed by: Lonni Necessary MD 12/04/2023 03:34 PM EDT RP Workstation: [id number]   CT Cervical Spine Wo Contrast Result Date: 12/01/2023 CLINICAL DATA:  Neck trauma, fall at home. EXAM: CT CERVICAL SPINE WITHOUT CONTRAST TECHNIQUE: Multidetector CT imaging of the cervical spine was performed without intravenous contrast. Multiplanar CT image reconstructions were also generated. RADIATION DOSE REDUCTION: This exam was performed according to the departmental dose-optimization program which includes automated exposure control, adjustment of the mA and/or kV according to patient size and/or use of iterative reconstruction technique. COMPARISON:  None Available. FINDINGS: Alignment: No traumatic subluxation. Grade 1 degenerative anterolisthesis of C3 on C4 and C7 on T1. broad-based dextroscoliotic curvature. Skull base and vertebrae: No acute fracture. The dens and skull base are intact. Degenerative Modic endplate changes at C5-C6. Moderate C1-C2 degenerative change. Soft tissues and spinal canal: No prevertebral fluid or swelling. No visible canal hematoma. Disc levels: Multilevel degenerative disc disease, prominent at C5-C6 and C6-C7. Mild to moderate multilevel facet hypertrophy. Upper chest: No acute findings. Other: Carotid calcifications. IMPRESSION: Multilevel degenerative disc disease and facet hypertrophy throughout the cervical spine without acute fracture or subluxation. Electronically Signed   By: Andrea Gasman M.D.   On: 12/01/2023 17:38   CT Head Wo Contrast Result Date: 12/01/2023 CLINICAL DATA:  Fall at home with head strike. Multiple falls this week. EXAM: CT HEAD WITHOUT CONTRAST TECHNIQUE: Contiguous axial images were obtained from the base of the  skull through the vertex without intravenous contrast. RADIATION DOSE REDUCTION: This exam was performed according to the departmental dose-optimization program which includes automated exposure control, adjustment of the mA and/or kV according to patient size and/or use of iterative reconstruction technique. COMPARISON:  Head CT 08/30/2017 FINDINGS: Brain: No intracranial hemorrhage. Age related atrophy. No hydrocephalus. The basilar cisterns are patent. Moderate to advanced  periventricular and deep white matter hypodensity typical of chronic small vessel ischemia, with progression from 2019. No evidence of territorial infarct or acute ischemia. 2-3 mm extra-axial collection in the left frontal region is isodense to CSF and consistent with hygroma. No associated mass effect. No midline shift. Vascular: Atherosclerosis of skullbase vasculature without hyperdense vessel or abnormal calcification. Skull: No fracture or focal lesion. Sinuses/Orbits: No acute fracture. Left supraorbital laceration. Diffuse mucosal thickening throughout the paranasal sinuses with fluid level in the right frontal sinus. No mastoid effusion. Other: Left supraorbital scalp laceration and hematoma. IMPRESSION: 1. Left supraorbital scalp laceration and hematoma. No acute intracranial abnormality. No skull fracture. 2. Age related atrophy and chronic small vessel ischemia. 3. 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 4. Diffuse mucosal thickening throughout the paranasal sinuses with fluid level in the right frontal sinus. Recommend correlation for symptoms of sinusitis. Electronically Signed   By: Andrea Gasman M.D.   On: 12/01/2023 17:34   DG Chest 2 View Result Date: 11/28/2023 EXAM: 2 VIEW(S) XRAY OF THE CHEST 11/22/2023 02:42:20 PM COMPARISON: 04/27/2023 CLINICAL HISTORY: Recurrent falls, cough productive. Cough x ~ 1 week. FINDINGS: LUNGS AND PLEURA: No focal pulmonary opacity. No pulmonary edema.  No pleural effusion. No pneumothorax. HEART AND MEDIASTINUM: No acute abnormality of the cardiac and mediastinal silhouettes. Aortic atherosclerosis. BONES AND SOFT TISSUES: No acute osseous abnormality. Thoracic degenerative changes. IMPRESSION: 1. No acute process. Electronically signed by: Franky Stanford MD 11/28/2023 11:57 AM EDT RP Workstation: [id number]     Discharge Exam: Vitals:   12/07/23 0521 12/07/23 0719  BP: 136/69 109/61  Pulse: 85 83  Resp: 18 18  Temp: 100 F (37.8 C) 98.5 F (36.9 C)  SpO2: 95% 96%   Vitals:   12/06/23 1954 12/06/23 1955 12/07/23 0521 12/07/23 0719  BP: (!) 161/65  136/69 109/61  Pulse: (!) 105  85 83  Resp: 18  18 18   Temp: 98.7 F (37.1 C)  100 F (37.8 C) 98.5 F (36.9 C)  TempSrc: Oral  Oral Oral  SpO2: 93% 97% 95% 96%  Weight:      Height:        General: Pt is alert, awake, not in acute distress, left periorbital ecchymosis Cardiovascular: RRR, S1/S2 +, no rubs, no gallops Respiratory: CTA bilaterally, no wheezing, no rhonchi Abdominal: Soft, NT, ND, bowel sounds + Extremities: no edema, no cyanosis Neuro: Very mild weakness in the left hand.  No other focal weakness.   The results of significant diagnostics from this hospitalization (including imaging, microbiology, ancillary and laboratory) are listed below for reference.     Microbiology: No results found for this or any previous visit (from the past 240 hours).   Labs: BNP (last 3 results) Recent Labs    04/27/23 0729  BNP 61.2   Basic Metabolic Panel: Recent Labs  Lab 12/04/23 1256 12/06/23 0318 12/07/23 0248  NA 137 136 135  K 4.9 4.6 4.6  CL 102 106 104  CO2 25 22 21*  GLUCOSE 101* 115* 110*  BUN 58* 52* 47*  CREATININE 3.02* 2.56* 2.62*  CALCIUM  8.7* 7.9* 8.0*   Liver Function Tests: Recent Labs  Lab 12/04/23 1256  AST 18  ALT 13  ALKPHOS 50  BILITOT 1.0  PROT 5.4*  ALBUMIN  2.9*   No results for input(s): LIPASE, AMYLASE in the last 168  hours. No results for input(s): AMMONIA in the last 168 hours. CBC: Recent Labs  Lab 12/04/23 1256 12/06/23  0318  WBC 11.3* 9.1  NEUTROABS 8.2* 6.4  HGB 10.8* 9.5*  HCT 33.8* 28.6*  MCV 99.4 96.3  PLT 207 168   Cardiac Enzymes: No results for input(s): CKTOTAL, CKMB, CKMBINDEX, TROPONINI in the last 168 hours. BNP: Invalid input(s): POCBNP CBG: No results for input(s): GLUCAP in the last 168 hours. D-Dimer No results for input(s): DDIMER in the last 72 hours. Hgb A1c Recent Labs    12/05/23 0435  HGBA1C 6.3*   Lipid Profile Recent Labs    12/05/23 0435  CHOL 119  HDL 55  LDLCALC 49  TRIG 76  CHOLHDL 2.2   Thyroid  function studies No results for input(s): TSH, T4TOTAL, T3FREE, THYROIDAB in the last 72 hours.  Invalid input(s): FREET3 Anemia work up No results for input(s): VITAMINB12, FOLATE, FERRITIN, TIBC, IRON, RETICCTPCT in the last 72 hours. Urinalysis    Component Value Date/Time   COLORURINE YELLOW 12/04/2023 1256   APPEARANCEUR CLEAR 12/04/2023 1256   LABSPEC 1.012 12/04/2023 1256   PHURINE 5.0 12/04/2023 1256   GLUCOSEU NEGATIVE 12/04/2023 1256   GLUCOSEU NEGATIVE 11/22/2023 1448   HGBUR NEGATIVE 12/04/2023 1256   BILIRUBINUR NEGATIVE 12/04/2023 1256   KETONESUR NEGATIVE 12/04/2023 1256   PROTEINUR NEGATIVE 12/04/2023 1256   UROBILINOGEN 0.2 11/22/2023 1448   NITRITE NEGATIVE 12/04/2023 1256   LEUKOCYTESUR NEGATIVE 12/04/2023 1256   Sepsis Labs Recent Labs  Lab 12/04/23 1256 12/06/23 0318  WBC 11.3* 9.1   Microbiology No results found for this or any previous visit (from the past 240 hours).  FURTHER DISCHARGE INSTRUCTIONS:   Get Medicines reviewed and adjusted: Please take all your medications with you for your next visit with your Primary MD   Laboratory/radiological data: Please request your Primary MD to go over all hospital tests and procedure/radiological results at the follow up, please  ask your Primary MD to get all Hospital records sent to his/her office.   In some cases, they will be blood work, cultures and biopsy results pending at the time of your discharge. Please request that your primary care M.D. goes through all the records of your hospital data and follows up on these results.   Also Note the following: If you experience worsening of your admission symptoms, develop shortness of breath, life threatening emergency, suicidal or homicidal thoughts you must seek medical attention immediately by calling 911 or calling your MD immediately  if symptoms less severe.   You must read complete instructions/literature along with all the possible adverse reactions/side effects for all the Medicines you take and that have been prescribed to you. Take any new Medicines after you have completely understood and accpet all the possible adverse reactions/side effects.    patient was instructed, not to drive, operate heavy machinery, perform activities at heights, swimming or participation in water activities or provide baby-sitting services while on Pain, Sleep and Anxiety Medications; until their outpatient Physician has advised to do so again. Also recommended to not to take more than prescribed Pain, Sleep and Anxiety Medications.  It is not advisable to combine anxiety, sleep and pain medications without talking with your primary care provider.     Wear Seat belts while driving.   Please note: You were cared for by a hospitalist during your hospital stay. Once you are discharged, your primary care physician will handle any further medical issues. Please note that NO REFILLS for any discharge medications will be authorized once you are discharged, as it is imperative that you return to your primary care  physician (or establish a relationship with a primary care physician if you do not have one) for your post hospital discharge needs so that they can reassess your need for medications and  monitor your lab values  Time coordinating discharge: Over 30 minutes  SIGNED:   Fredia Skeeter, MD  [facility] 12/07/2023, 11:00 AM *Please note that this is a verbal dictation therefore any spelling or grammatical errors are due to the Dragon Medical One system interpretation. If 7PM-7AM, please contact night-coverage [email or web address]

## 2023-12-07 NOTE — Plan of Care (Signed)
  Problem: Education: Goal: Knowledge of secondary prevention will improve (MUST DOCUMENT ALL) Outcome: Adequate for Discharge   Problem: Ischemic Stroke/TIA Tissue Perfusion: Goal: Complications of ischemic stroke/TIA will be minimized Outcome: Adequate for Discharge   Problem: Coping: Goal: Will identify appropriate support needs Outcome: Adequate for Discharge   Problem: Self-Care: Goal: Ability to communicate needs accurately will improve Outcome: Adequate for Discharge

## 2023-12-07 NOTE — Significant Event (Signed)
 Rapid Response Event Note   Reason for Call :  Brief loss of consciousness when getting up from [facility]. No output noted in BSC.  Initial Focused Assessment:  Pt AO, following commands. Moving all extremities appropriately. Left side weakness noted, not out of proportion to prior assessments. Denies headache, lightheadedness, or dizziness. No distress. Skin is warm, dry, pink. Endorses continued sensation to urinate.   VS: BP 119/59, HR 85, RR 16, SpO2 97% on room air CBG: 122  Interventions:  CBG  Plan of Care:  Per MD  Event Summary:  MD Notified: Dr. [name] Call Time: 1414 Arrival Time: 1416 End Time: 1430  Leonor LITTIE Danker, RN

## 2023-12-07 NOTE — Care Management Important Message (Signed)
 Important Message  Patient Details  Name: Stephen Wilkins MRN: [id number] Date of Birth: [birth year]   Important Message Given:  Yes - Medicare IM     Claretta Deed 12/07/2023, 4:14 PM

## 2023-12-07 NOTE — TOC Transition Note (Signed)
 Transition of Care [facility]) - Discharge Note   Patient Details  Name: Stephen Wilkins MRN: [id number] Date of Birth: [birth year]  [facility] CM/SW Contact:  Almarie CHRISTELLA Goodie, LCSW Phone Number: 12/07/2023, 4:34 PM   Clinical Narrative:   CSW following for discharge to SNF. [facility] has bed available, can admit today. Daughter, Tereasa, updated on discharge today and in agreement. Patient pending void trial at this time. Transport arranged for 6 pm, pending completion of void trial; number for transport if needs to be canceled is [phone]. [facility] aware of barrier to discharge.   If void trial completes, nurse to call report to [phone], Room 410B, prior to discharge.    Final next level of care: Skilled Nursing Facility Barriers to Discharge: Continued Medical Work up   Patient Goals and CMS Choice Patient states their goals for this hospitalization and ongoing recovery are:: patient unable to participate in goal setting, not fully oriented CMS Medicare.gov Compare Post Acute Care list provided to:: Patient Represenative (must comment) Choice offered to / list presented to : Adult Children [place] ownership interest in [facility].provided to:: Adult Children    Discharge Placement              Patient chooses bed at: [facility] Patient to be transferred to facility by: [facility] Name of family member notified: [name] Patient and family notified of of transfer: 12/07/23  Discharge Plan and Services Additional resources added to the After Visit Summary for       Post Acute Care Choice: Skilled Nursing Facility                               Social Drivers of Health (SDOH) Interventions SDOH Screenings   Food Insecurity: No Food Insecurity (12/04/2023)  Housing: Low Risk  (12/04/2023)  Transportation Needs: No Transportation Needs (12/04/2023)  Utilities: Not At Risk (12/04/2023)  Alcohol Screen: Low Risk  (10/02/2023)  Depression  (PHQ2-9): Low Risk  (10/02/2023)  Financial Resource Strain: Low Risk  (10/02/2023)  Physical Activity: Inactive (10/02/2023)  Social Connections: Moderately Isolated (12/04/2023)  Stress: No Stress Concern Present (10/02/2023)  Tobacco Use: Medium Risk (12/05/2023)  Health Literacy: Adequate Health Literacy (10/02/2023)     Readmission Risk Interventions    11/13/2022   10:22 AM  Readmission Risk Prevention Plan  Transportation Screening Complete  PCP or Specialist Appt within 5-7 Days Complete  Home Care Screening Complete  Medication Review (RN CM) Complete

## 2023-12-07 NOTE — Progress Notes (Signed)
 Pt was on bedside commode when he was ready to be assisted back to the bed. Pt attempted to stand with RN, but L leg was not working. Pt seemed to go limp, so I assisted the pt in the sitting position on the floor. Several staff called to assist, and pt placed supine on the floor with a pillow under his head. Minimally responsive so a rapid was called. Pt aroused to noxious stimuli, but still couldn't get up. All staff assisted pt back to bed. Vitals checked, and all WNL. Physician notified; seems to be a vagal response as the daughter said this has happened before.

## 2023-12-08 DIAGNOSIS — M4316 Spondylolisthesis, lumbar region: Secondary | ICD-10-CM | POA: Diagnosis not present

## 2023-12-08 DIAGNOSIS — F419 Anxiety disorder, unspecified: Secondary | ICD-10-CM | POA: Diagnosis not present

## 2023-12-08 DIAGNOSIS — I6521 Occlusion and stenosis of right carotid artery: Secondary | ICD-10-CM | POA: Diagnosis not present

## 2023-12-08 DIAGNOSIS — R058 Other specified cough: Secondary | ICD-10-CM | POA: Diagnosis not present

## 2023-12-08 DIAGNOSIS — J449 Chronic obstructive pulmonary disease, unspecified: Secondary | ICD-10-CM | POA: Diagnosis not present

## 2023-12-08 DIAGNOSIS — R531 Weakness: Secondary | ICD-10-CM | POA: Diagnosis not present

## 2023-12-08 DIAGNOSIS — E782 Mixed hyperlipidemia: Secondary | ICD-10-CM | POA: Diagnosis not present

## 2023-12-08 DIAGNOSIS — Z743 Need for continuous supervision: Secondary | ICD-10-CM | POA: Diagnosis not present

## 2023-12-08 DIAGNOSIS — S92512A Displaced fracture of proximal phalanx of left lesser toe(s), initial encounter for closed fracture: Secondary | ICD-10-CM | POA: Diagnosis not present

## 2023-12-08 DIAGNOSIS — R42 Dizziness and giddiness: Secondary | ICD-10-CM | POA: Diagnosis not present

## 2023-12-08 DIAGNOSIS — K222 Esophageal obstruction: Secondary | ICD-10-CM | POA: Diagnosis not present

## 2023-12-08 DIAGNOSIS — I951 Orthostatic hypotension: Secondary | ICD-10-CM | POA: Diagnosis not present

## 2023-12-08 DIAGNOSIS — R638 Other symptoms and signs concerning food and fluid intake: Secondary | ICD-10-CM | POA: Diagnosis not present

## 2023-12-08 DIAGNOSIS — K219 Gastro-esophageal reflux disease without esophagitis: Secondary | ICD-10-CM | POA: Diagnosis not present

## 2023-12-08 DIAGNOSIS — I739 Peripheral vascular disease, unspecified: Secondary | ICD-10-CM | POA: Diagnosis not present

## 2023-12-08 DIAGNOSIS — R413 Other amnesia: Secondary | ICD-10-CM | POA: Diagnosis not present

## 2023-12-08 DIAGNOSIS — I129 Hypertensive chronic kidney disease with stage 1 through stage 4 chronic kidney disease, or unspecified chronic kidney disease: Secondary | ICD-10-CM | POA: Diagnosis not present

## 2023-12-08 DIAGNOSIS — R2681 Unsteadiness on feet: Secondary | ICD-10-CM | POA: Diagnosis not present

## 2023-12-08 DIAGNOSIS — R339 Retention of urine, unspecified: Secondary | ICD-10-CM | POA: Diagnosis not present

## 2023-12-08 DIAGNOSIS — R7989 Other specified abnormal findings of blood chemistry: Secondary | ICD-10-CM | POA: Diagnosis not present

## 2023-12-08 DIAGNOSIS — J45909 Unspecified asthma, uncomplicated: Secondary | ICD-10-CM | POA: Diagnosis not present

## 2023-12-08 DIAGNOSIS — I639 Cerebral infarction, unspecified: Secondary | ICD-10-CM | POA: Diagnosis not present

## 2023-12-08 DIAGNOSIS — E785 Hyperlipidemia, unspecified: Secondary | ICD-10-CM | POA: Diagnosis not present

## 2023-12-08 DIAGNOSIS — R600 Localized edema: Secondary | ICD-10-CM | POA: Diagnosis not present

## 2023-12-08 DIAGNOSIS — E559 Vitamin D deficiency, unspecified: Secondary | ICD-10-CM | POA: Diagnosis not present

## 2023-12-08 DIAGNOSIS — I10 Essential (primary) hypertension: Secondary | ICD-10-CM | POA: Diagnosis not present

## 2023-12-08 DIAGNOSIS — N1832 Chronic kidney disease, stage 3b: Secondary | ICD-10-CM | POA: Diagnosis not present

## 2023-12-08 DIAGNOSIS — Z9181 History of falling: Secondary | ICD-10-CM | POA: Diagnosis not present

## 2023-12-08 DIAGNOSIS — R7303 Prediabetes: Secondary | ICD-10-CM | POA: Diagnosis not present

## 2023-12-08 DIAGNOSIS — M6281 Muscle weakness (generalized): Secondary | ICD-10-CM | POA: Diagnosis not present

## 2023-12-08 DIAGNOSIS — G629 Polyneuropathy, unspecified: Secondary | ICD-10-CM | POA: Diagnosis not present

## 2023-12-08 DIAGNOSIS — I63511 Cerebral infarction due to unspecified occlusion or stenosis of right middle cerebral artery: Secondary | ICD-10-CM | POA: Diagnosis not present

## 2023-12-08 DIAGNOSIS — F0390 Unspecified dementia without behavioral disturbance: Secondary | ICD-10-CM | POA: Diagnosis not present

## 2023-12-08 DIAGNOSIS — N400 Enlarged prostate without lower urinary tract symptoms: Secondary | ICD-10-CM | POA: Diagnosis not present

## 2023-12-08 DIAGNOSIS — N179 Acute kidney failure, unspecified: Secondary | ICD-10-CM | POA: Diagnosis not present

## 2023-12-08 DIAGNOSIS — R41841 Cognitive communication deficit: Secondary | ICD-10-CM | POA: Diagnosis not present

## 2023-12-08 DIAGNOSIS — R296 Repeated falls: Secondary | ICD-10-CM | POA: Diagnosis not present

## 2023-12-08 DIAGNOSIS — I959 Hypotension, unspecified: Secondary | ICD-10-CM | POA: Diagnosis not present

## 2023-12-08 DIAGNOSIS — S92021A Displaced fracture of anterior process of right calcaneus, initial encounter for closed fracture: Secondary | ICD-10-CM | POA: Diagnosis not present

## 2023-12-08 NOTE — Progress Notes (Signed)
 Attempted to call report to [facility]. Voice message left with nursing supervisor.

## 2023-12-08 NOTE — TOC Transition Note (Signed)
 Transition of Care [facility]) - Discharge Note   Patient Details  Name: Stephen Wilkins MRN: [id number] Date of Birth: 08-12-1927  [facility] CM/SW Contact:  Almarie CHRISTELLA Goodie, LCSW Phone Number: 12/08/2023, 1:12 PM   Clinical Narrative:   Patient voided overnight, can be discharged to [facility] today. CSW sent updates to [facility], spoke with daughter, [name], at bedside and she is in agreement. Transport arranged with [facility] for next available.  Nurse to call report to [phone], Room 410B.    Final next level of care: Skilled Nursing Facility Barriers to Discharge: Continued Medical Work up   Patient Goals and CMS Choice Patient states their goals for this hospitalization and ongoing recovery are:: patient unable to participate in goal setting, not fully oriented CMS Medicare.gov Compare Post Acute Care list provided to:: Patient Represenative (must comment) Choice offered to / list presented to : Adult Children [place] ownership interest in [facility].provided to:: Adult Children    Discharge Placement              Patient chooses bed at: [facility] Patient to be transferred to facility by: [facility] Name of family member notified: [name] Patient and family notified of of transfer: 12/07/23  Discharge Plan and Services Additional resources added to the After Visit Summary for       Post Acute Care Choice: Skilled Nursing Facility                               Social Drivers of Health (SDOH) Interventions SDOH Screenings   Food Insecurity: No Food Insecurity (12/04/2023)  Housing: Low Risk  (12/04/2023)  Transportation Needs: No Transportation Needs (12/04/2023)  Utilities: Not At Risk (12/04/2023)  Alcohol Screen: Low Risk  (10/02/2023)  Depression (PHQ2-9): Low Risk  (10/02/2023)  Financial Resource Strain: Low Risk  (10/02/2023)  Physical Activity: Inactive (10/02/2023)  Social Connections: Moderately Isolated (12/04/2023)  Stress: No  Stress Concern Present (10/02/2023)  Tobacco Use: Medium Risk (12/05/2023)  Health Literacy: Adequate Health Literacy (10/02/2023)     Readmission Risk Interventions    11/13/2022   10:22 AM  Readmission Risk Prevention Plan  Transportation Screening Complete  PCP or Specialist Appt within 5-7 Days Complete  Home Care Screening Complete  Medication Review (RN CM) Complete

## 2023-12-08 NOTE — Discharge Summary (Addendum)
 Physician Discharge Summary  Stephen Wilkins FMW:[id number] DOB: 01/18/1928 DOA: 12/04/2023  PCP: Norleen Lynwood ORN, MD  Admit date: 12/04/2023 Discharge date: 12/08/2023  Time spent: 40 minutes  Recommendations for Outpatient Follow-up:  Follow outpatient CBC/CMP  Follow with neurology outpatient Follow with vascular surgery for carotid artery stenosis DAPT x3 months, then aspirin  alone Follow renal function outpatient, we've stopped losartan   Follow blood pressure outpatient - syncopal event 9/4, possible orthostatic - change positions slowly/gradually Needs ophthalmology follow up outpatient   Discharge Diagnoses:  Principal Problem:   Stroke Stephen Wilkins) Active Problems:   Hyperlipidemia   Essential hypertension   ICAO (internal carotid artery occlusion), right   Discharge Condition: stable  Diet recommendation: heart healthy  Filed Weights   12/04/23 1139  Weight: 83.5 kg    History of present illness:    Stephen Wilkins is Stephen Wilkins 89 y.o. male with medical history significant of dementia, COPD, BPH, esophageal stricture, HTN, HLD, and diverticulosis presented with weakness in the left leg and inability to urinate.   The patient experienced Stephen Wilkins fall on Friday afternoon, resulting in Stephen Wilkins gash on the head and Stephen Wilkins black eye. Following the fall, the patient was transported to Stephen Wilkins, where Stephen Wilkins CT scan was performed and pt eventually discharged home the same day. Upon returning home, the pt was unable to ambulate or move LLE; thus, he re-presented to the ED and MRI revealed the presence of Stephen Wilkins stroke.  Patient was also found to have AKI on CKD.  Admitted to hospitalist service, neurology consulted.  Details below.  Wilkins Course:  Assessment and Plan:  Acute ischemic CVA:  MRI brain shows scattered foci of acute/subacute nonhemorrhagic infarct in the right watershed distribution, including right precentral gyrus, right middle frontal gyrus and lateral right occipital lobe.  Also has infarct in the left frontal  lobe and remote lacunar infarcts.   MRA neck shows 70% plus stenosis of the right ICA.  Mild focal stenosis in distal L vertebral artery.  Mild irregularity in cavernous ICA's bilaterally. No significant intracranial stenosis on MRA head.  Mild atherosclerotic changes in cavernous and supraclinoid ICAs bilaterally Carotid US  with 40-59% stenosis in R ICA, 1-39% in L ICA Echo with preserved EF, no intracardiac source of embolism detected  LDL 49, A1c 6.3 Patient was seen by neurology for the right MCA watershed distribution infarcts and several punctate L ACA and PCA infarcts concerning for cardioembolic source. Recommending DAPT x3 months, then aspirin  81 mg alone.  Crestor  10 mg.  For carotid stenosis, needs outpatient vascular surgery follow up.  Needs 30 day cardiac event monitoring.     Possible Recent [name] Needs outpatient ophthalmology follow up   Orthostatic Hypotension  Syncope Noted 9/4 afternoon with brief loss of consciousness when getting up from [facility].  Suspect orthostasis vs vagal event with him using bathroom.  Change positions slowly.  Stephen Wilkins notes this happens occasionally at home.  Essential hypertension: PTA medication include losartan .  Needed to allow permissive hypertension as low blood pressure may cause further worsening of the stroke.  Patient's blood pressure is within normal range despite of holding antihypertensives.  His losartan  is being discontinued at discharge.   AKI on CKD stage IIIb: Baseline creatinine around 1.7 about 6 months ago however since 2 months, his creatinine has been around 2.4-2.5, presented with 2.41 which peaked at 3.02, improved down to 2.6 with IV fluids.  This is close to his recent baseline.  Patient does not appear to be dehydrated, eating  drinking well.  UA 9/1 was bland.    BPH/urinary retention: Continue Flomax . Foley d/c'd yesterday, discharge delayed with delayed void.  Now voiding without issue.     Prediabetes: Hemoglobin A1c  only 6.3.        Procedures: Laceration repair with tissue adhesive by EDP   Carotid US  Summary:  Right Carotid: Velocities in the right ICA are consistent with [name] 40-59%                 stenosis.   Left Carotid: Velocities in the left ICA are consistent with [name] 1-39%  stenosis.   Vertebrals: Bilateral vertebral arteries demonstrate antegrade flow.  Subclavians: Normal flow hemodynamics were seen in bilateral subclavian               arteries.   Echo IMPRESSIONS     1. Left ventricular ejection fraction, by estimation, is 60 to 65%. The  left ventricle has normal function. The left ventricle has no regional  wall motion abnormalities. Left ventricular diastolic parameters are  consistent with Grade I diastolic  dysfunction (impaired relaxation).   2. Right ventricular systolic function is normal. The right ventricular  size is normal.   3. Restricted posterior leaflet. The mitral valve is normal in structure.  No evidence of mitral valve regurgitation. Mild mitral stenosis. The mean  mitral valve gradient is 4.0 mmHg.   4. The aortic valve is calcified. There is mild calcification of the  aortic valve. There is moderate thickening of the aortic valve. Aortic  valve regurgitation is not visualized. Aortic valve sclerosis is present,  with no evidence of aortic valve  stenosis. Aortic valve mean gradient measures 5.0 mmHg. Aortic valve Vmax  measures 1.61 m/s.   5. The inferior vena cava is normal in size with greater than 50%  respiratory variability, suggesting right atrial pressure of 3 mmHg.   6. Agitated saline contrast bubble study was negative, with no evidence  of any interatrial shunt.   Conclusion(s)/Recommendation(s): No intracardiac source of embolism  detected on this transthoracic study. Consider [name] transesophageal  echocardiogram to exclude cardiac source of embolism if clinically  indicated.    Consultations: neurology  Discharge Exam: Vitals:    12/08/23 0415 12/08/23 0727  BP: (!) 140/70 126/83  Pulse: 88 77  Resp: 18 16  Temp: 98.2 F (36.8 C) 97.7 F (36.5 C)  SpO2: 97% 98%   No complaints, asleep (discussed with RN, he was awake and appropriate earlier) Discussed with Stephen Wilkins prior to discharge  General: No acute distress. Cardiovascular: Heart sounds show Carma Dwiggins regular rate, and rhythm. Lungs: Clear to auscultation bilaterally Abdomen: Soft, nontender, nondistended Neurological: asleep when I saw him this AM (as above, discussed with RN who he was awake for earlier and he was appropriate and at his recent baseline) Extremities: No clubbing or cyanosis. No edema.   Discharge Instructions   Discharge Instructions     Ambulatory referral to Neurology   Complete by: As directed    Follow up with stroke clinic NP at [facility] in about 4-6 weeks. Thanks.   Discharge instructions   Complete by: As directed    You were seen after Torrin Frein stroke.  You were seen by neurology.  We're recommending aspirin  and plavix  for 3 months, followed by aspirin  alone.  Continue your crestor .  We'll arrange an outpatient cardiac monitor with cardiology to help rule out atrial fibrillation.  You have narrowing of your right internal carotid artery.  You'll need to follow up  with vascular surgery as an outpatient.  You need to follow with ophthalmology as an outpatient for your central vision loss.  We've stopped your losartan .  Follow your blood pressures outpatient.  Change positions slowly.  You had an episode of syncope (fainting) on 9/4 that I think was related to your blood pressure dropping with Elenna Spratling change in positions.  Follow this with your outpatient provider.  Return for new, recurrent, or worsening symptoms.  Please ask your PCP to request records from this hospitalization so they know what was done and what the next steps will be.      Allergies as of 12/08/2023       Reactions   Atorvastatin  Other (See Comments)   REACTION: myalgia    Doxazosin Mesylate Other (See Comments)   REACTION: dizziness   Hydrocodone  Bit-homatrop Mbr Other (See Comments)   anxiety   Hydrocodone  Anxiety        Medication List     STOP taking these medications    levofloxacin  500 MG tablet Commonly known as: LEVAQUIN    losartan  100 MG tablet Commonly known as: COZAAR        TAKE these medications    albuterol  (2.5 MG/3ML) 0.083% nebulizer solution Commonly known as: PROVENTIL  Take 3 mLs (2.5 mg total) by nebulization every 4 (four) hours as needed for wheezing or shortness of breath.   aspirin  EC 81 MG tablet Take 1 tablet (81 mg total) by mouth daily. Swallow whole.   clopidogrel  75 MG tablet Commonly known as: PLAVIX  Take 1 tablet (75 mg total) by mouth daily.   DONEPEZIL  HCL PO Take 1 tablet by mouth daily.   finasteride  5 MG tablet Commonly known as: PROSCAR  Take 5 mg by mouth daily.   MULTIVITAMINS PO Take 1 tablet by mouth daily.   pantoprazole  40 MG tablet Commonly known as: PROTONIX  Take 1 tablet (40 mg total) by mouth daily.   polyethylene glycol powder 17 GM/SCOOP powder Commonly known as: MiraLax  Take 1/2 capful by mouth once daily What changed:  how much to take how to take this when to take this reasons to take this   rosuvastatin  10 MG tablet Commonly known as: CRESTOR  Take 1 tablet (10 mg total) by mouth daily.   solifenacin  5 MG tablet Commonly known as: VESICARE  Take 1 tablet (5 mg total) by mouth daily.   tamsulosin  0.4 MG Caps capsule Commonly known as: FLOMAX  Take 1 capsule (0.4 mg total) by mouth in the morning and at bedtime.   Trelegy Ellipta  100-62.5-25 MCG/ACT Aepb Generic drug: Fluticasone -Umeclidin-Vilant Inhale 1 puff into the lungs daily.       Allergies  Allergen Reactions   Atorvastatin  Other (See Comments)    REACTION: myalgia   Doxazosin Mesylate Other (See Comments)    REACTION: dizziness   Hydrocodone  Bit-Homatrop Mbr Other (See Comments)    anxiety    Hydrocodone  Anxiety    Contact information for follow-up providers     [facility]. Schedule an appointment as soon as possible for Evarose Altland visit in 1 month(s).   Specialty: Neurology Why: stroke clinic Contact information: [address] [place]  [address] [phone]        Norleen Lynwood ORN, MD Follow up in 1 week(s).   Specialties: Internal Medicine, Radiology Contact information: [address] [place] [address] [phone]         [facility] Jahanna Raether [facility] [place] Follow  up in 1 month(s).   Specialty: Vascular Surgery Contact information: [address] [place]  [address] [phone]             Contact information for after-discharge care     Destination     [facility] .   Service: Skilled Nursing Contact information: [address] [place]  [address] [phone]                      The results of significant diagnostics from this hospitalization (including imaging, microbiology, ancillary and laboratory) are listed below for reference.    Significant Diagnostic Studies: VAS US  CAROTID Result Date: 12/06/2023 Carotid Arterial Duplex Study Patient Name:  SAUD BAIL  Date of Exam:   12/05/2023 Medical Rec #: [id number]     Accession #:    [id number] Date of Birth: March 19, 1928    Patient Gender: M Patient Age:   46 years Exam Location:  [facility] Procedure:      VAS US  CAROTID Referring Phys: NORMIE BUI --------------------------------------------------------------------------------  Indications:       Carotid artery disease and MRA 12/04/23 showed right ICA 70%                    stenosis or greater. Risk Factors:      Hypertension, hyperlipidemia, prior CVA. Other Factors:     COPD. Comparison Study:  02/15/2018 - Bilateral less than 50% stenosis. Performing Technologist: Ricka Sturdivant-Jones RDMS, RVT   Examination Guidelines: Shaylynn Nulty complete evaluation includes B-mode imaging, spectral Doppler, color Doppler, and power Doppler as needed of all accessible portions of each vessel. Bilateral testing is considered an integral part of Burnis Kaser complete examination. Limited examinations for reoccurring indications may be performed as noted.  Right Carotid Findings: +----------+--------+--------+--------+------------------+------------------+           PSV cm/sEDV cm/sStenosisPlaque DescriptionComments           +----------+--------+--------+--------+------------------+------------------+ CCA Prox  78      18                                                   +----------+--------+--------+--------+------------------+------------------+ CCA Mid                                             intimal thickening +----------+--------+--------+--------+------------------+------------------+ CCA Distal59      18                                                   +----------+--------+--------+--------+------------------+------------------+ ICA Prox  129     41      40-59%  heterogenous                         +----------+--------+--------+--------+------------------+------------------+ ICA Mid   146     45      40-59%  homogeneous                          +----------+--------+--------+--------+------------------+------------------+ ICA Distal71      26                                                   +----------+--------+--------+--------+------------------+------------------+  ECA       126     17                                                   +----------+--------+--------+--------+------------------+------------------+ +----------+--------+-------+----------------+-------------------+           PSV cm/sEDV cmsDescribe        Arm Pressure (mmHG) +----------+--------+-------+----------------+-------------------+ [id number]             Multiphasic, WNL                     +----------+--------+-------+----------------+-------------------+ +---------+--------+--+--------+--+---------+ VertebralPSV cm/s51EDV cm/s14Antegrade +---------+--------+--+--------+--+---------+  Left Carotid Findings: +----------+--------+--------+--------+------------------+------------------+           PSV cm/sEDV cm/sStenosisPlaque DescriptionComments           +----------+--------+--------+--------+------------------+------------------+ CCA Prox  97      17                                                   +----------+--------+--------+--------+------------------+------------------+ CCA Distal60      13                                intimal thickening +----------+--------+--------+--------+------------------+------------------+ ICA Prox  64      18      1-39%   calcific                             +----------+--------+--------+--------+------------------+------------------+ ICA Mid   68      20                                                   +----------+--------+--------+--------+------------------+------------------+ ICA Distal72      26                                                   +----------+--------+--------+--------+------------------+------------------+ ECA       88      14                                                   +----------+--------+--------+--------+------------------+------------------+ +----------+--------+--------+----------------+-------------------+           PSV cm/sEDV cm/sDescribe        Arm Pressure (mmHG) +----------+--------+--------+----------------+-------------------+ [id number]             Multiphasic, WNL                    +----------+--------+--------+----------------+-------------------+ +---------+--------+--+--------+--+---------+ VertebralPSV cm/s56EDV cm/s19Antegrade +---------+--------+--+--------+--+---------+   Summary: Right Carotid: Velocities in the right ICA are consistent  with Brayson Livesey 40-59%                stenosis. Left Carotid: Velocities in the left  ICA are consistent with Steffie Waggoner 1-39% stenosis. Vertebrals:  Bilateral vertebral arteries demonstrate antegrade flow. Subclavians: Normal flow hemodynamics were seen in bilateral subclavian              arteries. *See table(s) above for measurements and observations.  Electronically signed by Eather Popp MD on 12/06/2023 at 7:55:46 AM.    Final    ECHOCARDIOGRAM COMPLETE Result Date: 12/05/2023    ECHOCARDIOGRAM REPORT   Patient Name:   KOLLYN LINGAFELTER Date of Exam: 12/05/2023 Medical Rec #:  [id number]    Height:       72.0 in Accession #:    [id number]   Weight:       184.0 lb Date of Birth:  July 14, 1927   BSA:          2.056 m Patient Age:    89 years     BP:           122/86 mmHg Patient Gender: M            HR:           87 bpm. Exam Location:  Inpatient Procedure: 2D Echo, Cardiac Doppler and Color Doppler (Both Spectral and Color            Flow Doppler were utilized during procedure). Indications:    Stroke  History:        Patient has prior history of Echocardiogram examinations, most                 recent 11/18/2021. COPD and PAD; Risk Factors:Hypertension and                 Dyslipidemia. CKD STAGE 4.  Sonographer:    Philomena DARING Referring Phys: [id number] MARSHA ADA IMPRESSIONS  1. Left ventricular ejection fraction, by estimation, is 60 to 65%. The left ventricle has normal function. The left ventricle has no regional wall motion abnormalities. Left ventricular diastolic parameters are consistent with Grade I diastolic dysfunction (impaired relaxation).  2. Right ventricular systolic function is normal. The right ventricular size is normal.  3. Restricted posterior leaflet. The mitral valve is normal in structure. No evidence of mitral valve regurgitation. Mild mitral stenosis. The mean mitral valve gradient is 4.0 mmHg.  4. The aortic valve is calcified. There is mild calcification of the aortic valve. There is moderate thickening of the  aortic valve. Aortic valve regurgitation is not visualized. Aortic valve sclerosis is present, with no evidence of aortic valve stenosis. Aortic valve mean gradient measures 5.0 mmHg. Aortic valve Vmax measures 1.61 m/s.  5. The inferior vena cava is normal in size with greater than 50% respiratory variability, suggesting right atrial pressure of 3 mmHg.  6. Agitated saline contrast bubble study was negative, with no evidence of any interatrial shunt. Conclusion(s)/Recommendation(s): No intracardiac source of embolism detected on this transthoracic study. Consider Easton Fetty transesophageal echocardiogram to exclude cardiac source of embolism if clinically indicated. FINDINGS  Left Ventricle: Left ventricular ejection fraction, by estimation, is 60 to 65%. The left ventricle has normal function. The left ventricle has no regional wall motion abnormalities. The left ventricular internal cavity size was normal in size. There is  no left ventricular hypertrophy. Left ventricular diastolic parameters are consistent with Grade I diastolic dysfunction (impaired relaxation). Right Ventricle: The right ventricular size is normal. No increase in right ventricular wall thickness. Right ventricular systolic function is normal. Left Atrium: Left atrial size was normal in size. Right Atrium: Right atrial size was normal  in size. Pericardium: There is no evidence of pericardial effusion. Mitral Valve: Restricted posterior leaflet. The mitral valve is normal in structure. There is moderate thickening of the mitral valve leaflet(s). There is moderate calcification of the mitral valve leaflet(s). Mildly decreased mobility of the mitral valve leaflets. No evidence of mitral valve regurgitation. Mild mitral valve stenosis. MV peak gradient, 8.5 mmHg. The mean mitral valve gradient is 4.0 mmHg. Tricuspid Valve: The tricuspid valve is normal in structure. Tricuspid valve regurgitation is not demonstrated. No evidence of tricuspid stenosis.  Aortic Valve: The aortic valve is calcified. There is mild calcification of the aortic valve. There is moderate thickening of the aortic valve. Aortic valve regurgitation is not visualized. Aortic valve sclerosis is present, with no evidence of aortic valve stenosis. Aortic valve mean gradient measures 5.0 mmHg. Aortic valve peak gradient measures 10.4 mmHg. Aortic valve area, by VTI measures 3.09 cm. Pulmonic Valve: The pulmonic valve was normal in structure. Pulmonic valve regurgitation is not visualized. No evidence of pulmonic stenosis. Aorta: The aortic root is normal in size and structure. Venous: The inferior vena cava is normal in size with greater than 50% respiratory variability, suggesting right atrial pressure of 3 mmHg. IAS/Shunts: No atrial level shunt detected by color flow Doppler. Agitated saline contrast was given intravenously to evaluate for intracardiac shunting. Agitated saline contrast bubble study was negative, with no evidence of any interatrial shunt. There  is no evidence of Teiana Hajduk patent foramen ovale.  LEFT VENTRICLE PLAX 2D LVIDd:         3.93 cm   Diastology LVIDs:         2.45 cm   LV e' medial:    6.85 cm/s LV PW:         0.71 cm   LV E/e' medial:  16.5 LV IVS:        0.93 cm   LV e' lateral:   7.72 cm/s LVOT diam:     2.05 cm   LV E/e' lateral: 14.6 LV SV:         86 LV SV Index:   42 LVOT Area:     3.30 cm  RIGHT VENTRICLE             IVC RV S prime:     15.00 cm/s  IVC diam: 2.14 cm TAPSE (M-mode): 2.2 cm LEFT ATRIUM             Index        RIGHT ATRIUM           Index LA diam:        3.07 cm 1.49 cm/m   RA Area:     10.10 cm LA Vol (A2C):   23.4 ml 11.38 ml/m  RA Volume:   19.40 ml  9.43 ml/m LA Vol (A4C):   23.3 ml 11.33 ml/m LA Biplane Vol: 24.2 ml 11.77 ml/m  AORTIC VALVE AV Area (Vmax):    2.69 cm AV Area (Vmean):   2.86 cm AV Area (VTI):     3.09 cm AV Vmax:           161.00 cm/s AV Vmean:          103.000 cm/s AV VTI:            0.280 m AV Peak Grad:      10.4 mmHg  AV Mean Grad:      5.0 mmHg LVOT Vmax:         131.00 cm/s LVOT  Vmean:        89.200 cm/s LVOT VTI:          0.262 m LVOT/AV VTI ratio: 0.94  AORTA Ao Root diam: 2.83 cm Ao Asc diam:  3.51 cm MITRAL VALVE MV Area (PHT): 3.48 cm     SHUNTS MV Area VTI:   2.35 cm     Systemic VTI:  0.26 m MV Peak grad:  8.5 mmHg     Systemic Diam: 2.05 cm MV Mean grad:  4.0 mmHg MV Vmax:       1.46 m/s MV Vmean:      93.7 cm/s MV Decel Time: 218 msec MV E velocity: 113.00 cm/s MV Mirtha Jain velocity: 149.00 cm/s MV E/Cherry [place] ratio:  0.76 Oneil Parchment MD Electronically signed by Oneil Parchment MD Signature Date/Time: 12/05/2023/1:39:26 PM    Final    MR Angiogram Neck W or Wo Contrast Result Date: 12/04/2023 EXAM: MRA Neck without and with contrast 12/04/2023 05:26:26 PM TECHNIQUE: Multiplanar multisequence MRA of the neck was performed without and with the administration of intravenous contrast. 2D and 3D reformatted images are provided for review. Stenosis of the internal carotid arteries is measured using NASCET criteria. COMPARISON: None available CLINICAL HISTORY: Stroke/TIA, determine embolic source; Concern for watershed stroke. Radiology recommended MRA head and neck due to AKI. FINDINGS: CAROTID ARTERIES: Some slight images demonstrate asymmetric flow disturbance in the proximal right internal carotid artery. Irregular posterior atherosclerotic plaque is present along the proximal right ICA. 70 plus percent stenosis is present. No significant stenosis is present at the left ICA. Mild irregularity is present in the cavernous internal carotid arteries bilaterally. VERTEBRAL ARTERIES: Moderate focal stenosis is present in the distal left vertebral artery at the vertebral basilar junction. No dissection. IMPRESSION: 1. Irregular posterior atherosclerotic plaque in the proximal right ICA with 70+% stenosis relative to the more distal vessel . 2. Moderate focal stenosis in the distal left vertebral artery at the vertebrobasilar junction. 3. Mild  irregularity in the cavernous internal carotid arteries bilaterally. Electronically signed by: Lonni Necessary MD 12/04/2023 05:42 PM EDT RP Workstation: [id number]   MR ANGIO HEAD WO CONTRAST Result Date: 12/04/2023 EXAM: MR Angiography Head without intravenous Contrast. 12/04/2023 05:26:26 PM TECHNIQUE: Magnetic resonance angiography images of the head without intravenous contrast. Multiplanar 2D and 3D reformatted images are provided for review. COMPARISON: MR head 12/04/2023. CLINICAL HISTORY: Stroke/TIA, determine embolic source; Concern for watershed stroke. Radiology recommended MRA head and neck due to AKI. FINDINGS: ANTERIOR CIRCULATION: Mild atherosclerotic changes are present in the cavernous and supraclinoid internal carotid arteries bilaterally without significant stenoses. No significant stenosis of the anterior cerebral arteries. No significant stenosis of the middle cerebral arteries. No aneurysm. POSTERIOR CIRCULATION: No significant stenosis of the posterior cerebral arteries. No significant stenosis of the basilar artery. No significant stenosis of the vertebral arteries. No aneurysm. IMPRESSION: 1. No significant stenosis of the intracranial vasculature. 2. Mild atherosclerotic changes in the cavernous and supraclinoid internal carotid arteries bilaterally. Electronically signed by: Lonni Necessary MD 12/04/2023 05:39 PM EDT RP Workstation: [id number]   DG Chest Portable 1 View Result Date: 12/04/2023 EXAM: 1 VIEW XRAY OF THE CHEST 12/04/2023 04:27:00 PM COMPARISON: 11/22/2023 CLINICAL HISTORY: covid +, fatigue, falls, urinary retention, aki. Reason for exam: covid +, fatigue, falls, urinary retention, aki; Triage notes: ; Pt from home and home health c/o left sided weakness. Pt states he can not urinate. Denies left sided weakness. Axox4. VSS. FINDINGS: LUNGS AND PLEURA: No focal pulmonary opacity. No pulmonary edema. No  pleural effusion. No pneumothorax. HEART AND MEDIASTINUM: No  acute abnormality of the cardiac and mediastinal silhouettes. BONES AND SOFT TISSUES: No acute osseous abnormality. VASCULATURE: Aortic atherosclerosis. IMPRESSION: 1. No acute findings. Electronically signed by: Selinda Blue MD 12/04/2023 04:54 PM EDT RP Workstation: [id number]   MR LUMBAR SPINE WO CONTRAST Result Date: 12/04/2023 EXAM: MRI LUMBAR SPINE 12/04/2023 02:53:59 PM TECHNIQUE: Multiplanar multisequence MRI of the lumbar spine was performed without the administration of intravenous contrast. COMPARISON: MRI of the lumbar spine 07/12/2014. CLINICAL HISTORY: Low back pain, trauma; Left leg weakness that is new. Recent fall. Also urinary retention that is new. Pt from home and home health c/o left sided weakness. Pt states he can not urinate. Denies left sided weakness. Axox4. VSS. FINDINGS: BONES AND ALIGNMENT: Mild leftward curvature is centered at L2-3. Slight retrolisthesis at L2-3 is stable. Grade 1 anterolisthesis at L4-5 has progressed. SPINAL CORD: The conus terminates normally. SOFT TISSUES: Erykah Lippert 4.3 cm simple cyst is present at the lower pole of the right kidney. Left renal atrophy is new since the prior study. Atherosclerotic changes are present within the abdominal aorta. Maximal diameter is 31 mm. L1-L2: Mild leftward disc protrusion at L1-2 is new without significant stenosis. L2-L3: Ariane Ditullio broad-based disc protrusion, asymmetric to the right, and moderate bilateral facet hypertrophy has progressed at L2-3. Moderate right and mild left subarticular stenosis is present. Mild right foraminal narrowing is present. L3-L4: Aulani Shipton leftward broad-based disc protrusion is similar to prior study. Mild left subarticular and foraminal stenosis is stable. L4-L5: Progressive facet hypertrophy is present bilaterally. Moderate subarticular and left foraminal narrowing is similar to the prior study. L5-S1: Mild facet hypertrophy is present at L5 to S1. No focal disc protrusion or stenosis is present. IMPRESSION: 1.  Progressed grade 1 anterolisthesis at L4-5. 2. New mild leftward disc protrusion at L1-2 without significant stenosis. 3. Progressed broad-based disc protrusion at L2-3 with moderate right and mild left subarticular stenosis and mild right foraminal narrowing. 4. Stable leftward broad-based disc protrusion at L3-4 with mild left subarticular and foraminal stenosis. 5. Moderate subarticular and left foraminal narrowing at L4-5, similar to prior study. Electronically signed by: Lonni Necessary MD 12/04/2023 03:40 PM EDT RP Workstation: [id number]   MR BRAIN WO CONTRAST Result Date: 12/04/2023 EXAM: MR Brain Without Intravenous Contrast. CLINICAL HISTORY: Head trauma, minor (Age >= 65y). Pt from home and home health c/o left sided weakness. Pt states he can not urinate. Denies left sided weakness. Axox4. VSS. TECHNIQUE: Magnetic resonance images of the brain without intravenous contrast in multiple planes. CONTRAST: None. COMPARISON: CT head without contrast 12/01/2023. FINDINGS: BRAIN: Scattered foci of acute/subacute nonhemorrhagic infarcts are present in Keston Seever right watershed distribution. Infarcts are present within the right precentral gyrus. More confluent areas of infarction are present anteriorly in the right middle frontal gyrus and in the lateral right occipital lobe. Annalyn Blecher single focus of acute/subacute infarction is present anteriorly in the left frontal lobe. White matter changes extend into the brainstem. Remote lacunar infarcts are present within the basal ganglia and thalami bilaterally. Remote lacunar infarcts are present in the right cerebral cerebellar hemisphere. Susceptibility artifact are present in the anterior frontal lobes bilaterally. VENTRICLES: No hydrocephalus. ORBITS: CALL MEASURES CURRENT ER CALL MEASURES CURRENT EMERGENCY DEPARTMENT Bilateral lens replacements are noted. The globes and orbits are otherwise within normal limits. SINUSES AND MASTOIDS: Moderate mucosal thickening is present in  the anterior ethmoid air cells bilaterally. The right frontal sinus is near completely opacified. The left frontal sinus is hypoplastic.  Rory Xiang small right mastoid effusion is present. No obstructing nasopharyngeal lesion is present. BONES: No acute fracture or focal osseous lesion. IMPRESSION: 1. Scattered foci of acute/subacute nonhemorrhagic infarcts in Mahnoor Mathisen right watershed distribution, including the right precentral gyrus, right middle frontal gyrus, and lateral right occipital lobe. This suggests either right carotid stenosis or right-sided embolic disease 2. Declin Rajan single focus of acute/subacute infarction is present anteriorly in the left frontal lobe. 3. Remote lacunar infarcts in the basal ganglia and thalami bilaterally, as well as in the right cerebral cerebellar hemisphere. 4. Moderate mucosal thickening in the anterior ethmoid air cells bilaterally and near complete opacification of the right frontal sinus. 5. Small right mastoid effusion. Findings were called to Dr. Ginger at 3:30pm Electronically signed by: Lonni Necessary MD 12/04/2023 03:34 PM EDT RP Workstation: [id number]   CT Cervical Spine Wo Contrast Result Date: 12/01/2023 CLINICAL DATA:  Neck trauma, fall at home. EXAM: CT CERVICAL SPINE WITHOUT CONTRAST TECHNIQUE: Multidetector CT imaging of the cervical spine was performed without intravenous contrast. Multiplanar CT image reconstructions were also generated. RADIATION DOSE REDUCTION: This exam was performed according to the departmental dose-optimization program which includes automated exposure control, adjustment of the mA and/or kV according to patient size and/or use of iterative reconstruction technique. COMPARISON:  None Available. FINDINGS: Alignment: No traumatic subluxation. Grade 1 degenerative anterolisthesis of C3 on C4 and C7 on T1. broad-based dextroscoliotic curvature. Skull base and vertebrae: No acute fracture. The dens and skull base are intact. Degenerative Modic endplate  changes at C5-C6. Moderate C1-C2 degenerative change. Soft tissues and spinal canal: No prevertebral fluid or swelling. No visible canal hematoma. Disc levels: Multilevel degenerative disc disease, prominent at C5-C6 and C6-C7. Mild to moderate multilevel facet hypertrophy. Upper chest: No acute findings. Other: Carotid calcifications. IMPRESSION: Multilevel degenerative disc disease and facet hypertrophy throughout the cervical spine without acute fracture or subluxation. Electronically Signed   By: Andrea Gasman M.D.   On: 12/01/2023 17:38   CT Head Wo Contrast Result Date: 12/01/2023 CLINICAL DATA:  Fall at home with head strike. Multiple falls this week. EXAM: CT HEAD WITHOUT CONTRAST TECHNIQUE: Contiguous axial images were obtained from the base of the skull through the vertex without intravenous contrast. RADIATION DOSE REDUCTION: This exam was performed according to the departmental dose-optimization program which includes automated exposure control, adjustment of the mA and/or kV according to patient size and/or use of iterative reconstruction technique. COMPARISON:  Head CT 08/30/2017 FINDINGS: Brain: No intracranial hemorrhage. Age related atrophy. No hydrocephalus. The basilar cisterns are patent. Moderate to advanced periventricular and deep white matter hypodensity typical of chronic small vessel ischemia, with progression from 2019. No evidence of territorial infarct or acute ischemia. 2-3 mm extra-axial collection in the left frontal region is isodense to CSF and consistent with hygroma. No associated mass effect. No midline shift. Vascular: Atherosclerosis of skullbase vasculature without hyperdense vessel or abnormal calcification. Skull: No fracture or focal lesion. Sinuses/Orbits: No acute fracture. Left supraorbital laceration. Diffuse mucosal thickening throughout the paranasal sinuses with fluid level in the right frontal sinus. No mastoid effusion. Other: Left supraorbital scalp  laceration and hematoma. IMPRESSION: 1. Left supraorbital scalp laceration and hematoma. No acute intracranial abnormality. No skull fracture. 2. Age related atrophy and chronic small vessel ischemia. 3. 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 4. Diffuse mucosal thickening throughout the paranasal sinuses with fluid level in the right frontal sinus. Recommend correlation for symptoms of sinusitis. Electronically Signed   By: Andrea  Sanford M.D.   On: 12/01/2023 17:34   DG Chest 2 View Result Date: 11/28/2023 EXAM: 2 VIEW(S) XRAY OF THE CHEST 11/22/2023 02:42:20 PM COMPARISON: 04/27/2023 CLINICAL HISTORY: Recurrent falls, cough productive. Cough x ~ 1 week. FINDINGS: LUNGS AND PLEURA: No focal pulmonary opacity. No pulmonary edema. No pleural effusion. No pneumothorax. HEART AND MEDIASTINUM: No acute abnormality of the cardiac and mediastinal silhouettes. Aortic atherosclerosis. BONES AND SOFT TISSUES: No acute osseous abnormality. Thoracic degenerative changes. IMPRESSION: 1. No acute process. Electronically signed by: Franky Stanford MD 11/28/2023 11:57 AM EDT RP Workstation: [id number]    Microbiology: No results found for this or any previous visit (from the past 240 hours).   Labs: Basic Metabolic Panel: Recent Labs  Lab 12/04/23 1256 12/06/23 0318 12/07/23 0248  NA 137 136 135  K 4.9 4.6 4.6  CL 102 106 104  CO2 25 22 21*  GLUCOSE 101* 115* 110*  BUN 58* 52* 47*  CREATININE 3.02* 2.56* 2.62*  CALCIUM  8.7* 7.9* 8.0*   Liver Function Tests: Recent Labs  Lab 12/04/23 1256  AST 18  ALT 13  ALKPHOS 50  BILITOT 1.0  PROT 5.4*  ALBUMIN  2.9*   No results for input(s): LIPASE, AMYLASE in the last 168 hours. No results for input(s): AMMONIA in the last 168 hours. CBC: Recent Labs  Lab 12/04/23 1256 12/06/23 0318  WBC 11.3* 9.1  NEUTROABS 8.2* 6.4  HGB 10.8* 9.5*  HCT 33.8* 28.6*  MCV 99.4 96.3  PLT 207 168   Cardiac Enzymes: No  results for input(s): CKTOTAL, CKMB, CKMBINDEX, TROPONINI in the last 168 hours. BNP: BNP (last 3 results) Recent Labs    04/27/23 0729  BNP 61.2    ProBNP (last 3 results) No results for input(s): PROBNP in the last 8760 hours.  CBG: Recent Labs  Lab 12/07/23 1420  GLUCAP 122*       Signed:  Meliton Monte MD.  [facility] 12/08/2023, 11:08 AM

## 2023-12-08 NOTE — Plan of Care (Signed)
  Problem: Education: Goal: Knowledge of disease or condition will improve Outcome: Progressing Goal: Knowledge of patient specific risk factors will improve (DELETE if not current risk factor) Outcome: Not Progressing   Problem: Ischemic Stroke/TIA Tissue Perfusion: Goal: Complications of ischemic stroke/TIA will be minimized Outcome: Progressing   Problem: Coping: Goal: Will verbalize positive feelings about self Outcome: Progressing Goal: Will identify appropriate support needs Outcome: Progressing

## 2023-12-14 DIAGNOSIS — E559 Vitamin D deficiency, unspecified: Secondary | ICD-10-CM | POA: Diagnosis not present

## 2023-12-14 DIAGNOSIS — F0390 Unspecified dementia without behavioral disturbance: Secondary | ICD-10-CM | POA: Diagnosis not present

## 2023-12-14 DIAGNOSIS — N400 Enlarged prostate without lower urinary tract symptoms: Secondary | ICD-10-CM | POA: Diagnosis not present

## 2023-12-14 DIAGNOSIS — E782 Mixed hyperlipidemia: Secondary | ICD-10-CM | POA: Diagnosis not present

## 2023-12-14 DIAGNOSIS — R531 Weakness: Secondary | ICD-10-CM | POA: Diagnosis not present

## 2023-12-14 DIAGNOSIS — E785 Hyperlipidemia, unspecified: Secondary | ICD-10-CM | POA: Diagnosis not present

## 2023-12-14 DIAGNOSIS — I10 Essential (primary) hypertension: Secondary | ICD-10-CM | POA: Diagnosis not present

## 2023-12-20 ENCOUNTER — Telehealth: Payer: Self-pay | Admitting: Radiology

## 2023-12-20 DIAGNOSIS — F419 Anxiety disorder, unspecified: Secondary | ICD-10-CM | POA: Diagnosis not present

## 2023-12-20 DIAGNOSIS — S92021A Displaced fracture of anterior process of right calcaneus, initial encounter for closed fracture: Secondary | ICD-10-CM | POA: Diagnosis not present

## 2023-12-20 DIAGNOSIS — R7989 Other specified abnormal findings of blood chemistry: Secondary | ICD-10-CM | POA: Diagnosis not present

## 2023-12-20 NOTE — Telephone Encounter (Signed)
 Copied from CRM #[id number]. Topic: Clinical - Medical Advice >> Dec 20, 2023  7:56 AM Berneda FALCON wrote: Reason for CRM: [name] states pt is at [facility] and has had some strokes and has been falling more and more and having increased memory troubles.She would like to speak to Dr. Norleen directly if at all possible and is requesting a callback as soon as possible please.  [phone]

## 2023-12-21 DIAGNOSIS — E559 Vitamin D deficiency, unspecified: Secondary | ICD-10-CM | POA: Diagnosis not present

## 2023-12-21 DIAGNOSIS — E782 Mixed hyperlipidemia: Secondary | ICD-10-CM | POA: Diagnosis not present

## 2023-12-21 DIAGNOSIS — I10 Essential (primary) hypertension: Secondary | ICD-10-CM | POA: Diagnosis not present

## 2023-12-21 DIAGNOSIS — F0390 Unspecified dementia without behavioral disturbance: Secondary | ICD-10-CM | POA: Diagnosis not present

## 2023-12-27 ENCOUNTER — Other Ambulatory Visit: Payer: Self-pay | Admitting: Cardiology

## 2023-12-27 ENCOUNTER — Encounter: Payer: Self-pay | Admitting: Pharmacist

## 2023-12-27 ENCOUNTER — Encounter: Payer: Self-pay | Admitting: *Deleted

## 2023-12-27 DIAGNOSIS — E785 Hyperlipidemia, unspecified: Secondary | ICD-10-CM | POA: Diagnosis not present

## 2023-12-27 DIAGNOSIS — N400 Enlarged prostate without lower urinary tract symptoms: Secondary | ICD-10-CM | POA: Diagnosis not present

## 2023-12-27 DIAGNOSIS — I639 Cerebral infarction, unspecified: Secondary | ICD-10-CM

## 2023-12-27 DIAGNOSIS — K219 Gastro-esophageal reflux disease without esophagitis: Secondary | ICD-10-CM | POA: Diagnosis not present

## 2023-12-27 DIAGNOSIS — I6340 Cerebral infarction due to embolism of unspecified cerebral artery: Secondary | ICD-10-CM

## 2023-12-27 NOTE — Telephone Encounter (Signed)
 Yes I normally dont call pt back directly, but if you call, I can answer question or requests

## 2023-12-27 NOTE — Progress Notes (Signed)
 Patient ID: CALIN FANTROY, male   DOB: April 11, 1927, 89 y.o.   MRN: [id number] Patient enrolled for [employer] to ship a 30 day cardiac event monitor to : Prentice Daring in care of [facility] [address] [place], [place]  [address] Attn: [facility]  Letter with instructions mailed to facility.  Dr. Anner to read.

## 2023-12-27 NOTE — Progress Notes (Signed)
 Pharmacy Quality Measure Review  This patient is appearing on a report for being at risk of failing the adherence measure for cholesterol (statin) medications this calendar year.   Medication: Rosuvastatin  Last fill date: 10/30/23 for 90 day supply Previous fill date: 04/29/23 for 90 DS  Pt will likely fail metric given first refill this year was 3 months overdue. Currently up to date on refills. No action needed at this time.    Darrelyn Drum, PharmD, BCPS, CPP Clinical Pharmacist Practitioner [facility] [phone]

## 2023-12-28 ENCOUNTER — Telehealth: Payer: Self-pay | Admitting: Adult Health

## 2023-12-28 DIAGNOSIS — I10 Essential (primary) hypertension: Secondary | ICD-10-CM | POA: Diagnosis not present

## 2023-12-28 DIAGNOSIS — F0390 Unspecified dementia without behavioral disturbance: Secondary | ICD-10-CM | POA: Diagnosis not present

## 2023-12-28 DIAGNOSIS — E559 Vitamin D deficiency, unspecified: Secondary | ICD-10-CM | POA: Diagnosis not present

## 2023-12-28 DIAGNOSIS — E782 Mixed hyperlipidemia: Secondary | ICD-10-CM | POA: Diagnosis not present

## 2023-12-28 NOTE — Telephone Encounter (Signed)
 Stephen Wilkins) calling to verify appointment

## 2024-01-01 NOTE — Telephone Encounter (Signed)
 Called and spoke with patient's spouse as well as son. At this time no questions or requests as a lot is still up in the air. After speaking with son he had informed me patient has had approximately 13 strokes. Patient has not been able to get out of the bed since being at [facility]. Notes memory issues were related to newfound dementia

## 2024-01-03 ENCOUNTER — Encounter: Payer: Self-pay | Admitting: Adult Health

## 2024-01-03 ENCOUNTER — Ambulatory Visit: Admitting: Adult Health

## 2024-01-03 VITALS — BP 121/64 | HR 92 | Ht 72.0 in | Wt 185.0 lb

## 2024-01-03 DIAGNOSIS — I63511 Cerebral infarction due to unspecified occlusion or stenosis of right middle cerebral artery: Secondary | ICD-10-CM | POA: Diagnosis not present

## 2024-01-03 DIAGNOSIS — R413 Other amnesia: Secondary | ICD-10-CM | POA: Diagnosis not present

## 2024-01-03 DIAGNOSIS — E785 Hyperlipidemia, unspecified: Secondary | ICD-10-CM

## 2024-01-03 NOTE — Patient Instructions (Signed)
 Your Plan:  Your Plan:  Continue Aspirin  and Plavix   Blood pressure goal <130/90 Cholesterol LDL goal <70 Diabetes goal A1c <7 Monitor diet and try to exercise   Thank you for coming to see us  at [facility]. I hope we have been able to provide you high quality care today.  You may receive a patient satisfaction survey over the next few weeks. We would appreciate your feedback and comments so that we may continue to improve ourselves and the health of our patients.      Thank you for coming to see us  at [facility]. I hope we have been able to provide you high quality care today.  You may receive a patient satisfaction survey over the next few weeks. We would appreciate your feedback and comments so that we may continue to improve ourselves and the health of our patients.

## 2024-01-03 NOTE — Progress Notes (Unsigned)
 PATIENT: Stephen Wilkins DOB: 28-Dec-1927  REASON FOR VISIT: follow up HISTORY FROM: patient PRIMARY NEUROLOGIST: Dr. Rosemarie  Chief Complaint  Patient presents with   Follow-up    Pt in 4 with daughter and wife Pt here for stroke f/u Pt states no questions or concerns for today's visit  Pt was admitted to hospital   sept 2025      HISTORY OF PRESENT ILLNESS: Today 01/04/24   Stephen Wilkins is a 89 y.o. male who is here for hospital follow-up afterright MCA watershed distribution infarcts and several punctate left ACA and PCA infarcts, concerning for cardioembolic source. Returns today for follow-up.  He reports that overall he has returned to his baseline.  He reports some weakness in the left lower extremity.  He states approximately 2 to 3 weeks prior to his stroke he had black spots in his vision.  He is currently at [facility] completing rehab.  They are having a discharge meeting today.  Family is trying to decide on next steps he currently is on aspirin  and Plavix .  Has follow-up with cardiology later this month for cardiac monitor and vein and vascular for carotid stenosis.  Blood pressure is in normal range.  LDL was in normal range.  Patient is currently on Crestor .  Hemoglobin A1c was 6.3.  Family does note that he had some memory issues prior to the stroke.  Reports that primary care had given him medication but he did not tolerate it.  He returns today for an evaluation.    Imaging:  MRI BRAIN IMPRESSION: 1. No significant stenosis of the intracranial vasculature. 2. Mild atherosclerotic changes in the cavernous and supraclinoid internal carotid arteries bilaterally.  MRA brain IMPRESSION: 1. Irregular posterior atherosclerotic plaque in the proximal right ICA with 70+% stenosis relative to the more distal vessel . 2. Moderate focal stenosis in the distal left vertebral artery at the vertebrobasilar junction. 3. Mild irregularity in the cavernous internal carotid  arteries bilaterally.  FU: Labs: Carotid dopplers? ECHO: Discharge note Work? OSA?   HISTORY (copied from Hospital): Mr. Stephen Wilkins is a 89 y.o. male with history of HTN, HLD, GERD, dementia, COPD, anxiety, hypothyroidism, esophageal stricture, CKD, recent fall, GI bleed and thoracic aortic aneurysm presented with weakness in the left leg.  MRI brain showed acute ischemic watershed distribution in the right MCA territory, left frontal infarct.   NIH on Admission: 5.   Stroke:  right MCA watershed distribution infarcts and several punctate left ACA and PCA infarcts, concerning for cardioembolic source Code Stroke CT head: Left supraorbital scalp laceration and hematoma. No acute intracranial abnormality. No skull fracture. MRI  Scattered acute/subacute nonhemorrhagic infarcts right watershed distribution, including right precentral gyrus, right middle frontal gyrus and lateral right occipital lobe. Single acute/subacute infarct anterior left frontal lobe. Remote lacunar infarcts bilateral basal ganglia and thalami MRA  Head: No significant stenosis MRA Neck: Irregular posterior atherosclerotic plaque proximal right ICA with 70+ percent stenosis. Moderate focal stenosis distal left vertebral artery at vertebrobasilar junction Carotid Doppler R ICA 40-59% stenosis 2D Echo: EF 60 to 65%, grade 1 diastolic dysfunction, mild MS, negative bubble study Recommend 30-day cardiac event monitoring as outpatient to rule out A-fib LDL 49 HgbA1c 6.3 VTE prophylaxis - ok for lovenox No antithrombotic prior to admission, now on aspirin  81 mg daily and clopidogrel  75 mg daily for 3 months due to large vessel disease and then Aspirin  81mg  alone.  Therapy recommendations:  SNF Disposition:  pending  Carotid stenosis MRA Neck: Irregular posterior atherosclerotic plaque proximal right ICA with 70+ percent stenosis. Moderate focal stenosis distal left vertebral artery at vertebrobasilar junction Carotid  Doppler R ICA 40-59% stenosis Recommend outpatient vascular surgery follow-up for monitoring   Possible recent left BRAO Reported left eye vision deficit 1 month ago Now still reporting left eye central vision loss Not much on exam today Left ICA patent  Continue DAPT and follow up with ophthalmologist.    AKI on CKD IIIb Dehydration  Transient head and hand tremor Baseline Cre 1.7-2.4 This admission Cre 3.02 S/p IVF Head and hand tremor improved Continue monitoring BMP   Hypertension Home meds: Losartan  100 mg Stable Long term BP goal: normotensive  Avoid hypotension   Hyperlipidemia Home meds:  Crestor  10mg , resumed in hospital LDL 49, goal < 70 Continue statin at discharge   Pre-diabetic Home meds:  none HgbA1c 6.3, goal < 7.0 CBGs SSI Recommend close follow-up with PCP    Other Stroke Risk Factors Advanced Age Former Cigarette Smoker   Other Active Problems, per Primary Vascular dementia, Mild cognitive impairment BPH, home meds: Flomax  0.4 mg twice daily, Vesicare  5 mg daily Recent falls COPD   REVIEW OF SYSTEMS: Out of a complete 14 system review of symptoms, the patient complains only of the following symptoms, and all other reviewed systems are negative.  ALLERGIES: Allergies  Allergen Reactions   Atorvastatin  Other (See Comments)    REACTION: myalgia   Doxazosin Mesylate Other (See Comments)    REACTION: dizziness   Hydrocodone  Bit-Homatrop Mbr Other (See Comments)    anxiety   Hydrocodone  Anxiety    HOME MEDICATIONS: Outpatient Medications Prior to Visit  Medication Sig Dispense Refill   albuterol  (PROVENTIL ) (2.5 MG/3ML) 0.083% nebulizer solution Take 3 mLs (2.5 mg total) by nebulization every 4 (four) hours as needed for wheezing or shortness of breath. 150 mL 0   aspirin  EC 81 MG tablet Take 1 tablet (81 mg total) by mouth daily. Swallow whole. 90 tablet 0   clopidogrel  (PLAVIX ) 75 MG tablet Take 1 tablet (75 mg total) by mouth daily. 88  tablet 0   Fluticasone -Umeclidin-Vilant (TRELEGY ELLIPTA ) 100-62.5-25 MCG/ACT AEPB Inhale 1 puff into the lungs daily. 1 each 11   Multiple Vitamin (MULTIVITAMINS PO) Take 1 tablet by mouth daily.     pantoprazole  (PROTONIX ) 40 MG tablet Take 1 tablet (40 mg total) by mouth daily. 90 tablet 3   polyethylene glycol powder (MIRALAX ) powder Take 1/2 capful by mouth once daily (Patient taking differently: Take 8.5 g by mouth daily as needed for mild constipation. Take 1/2 capful by mouth once daily) 850 g 5   rosuvastatin  (CRESTOR ) 10 MG tablet Take 1 tablet (10 mg total) by mouth daily. 90 tablet 3   solifenacin  (VESICARE ) 5 MG tablet Take 1 tablet (5 mg total) by mouth daily. 90 tablet 3   tamsulosin  (FLOMAX ) 0.4 MG CAPS capsule Take 1 capsule (0.4 mg total) by mouth in the morning and at bedtime. 180 capsule 3   DONEPEZIL  HCL PO Take 1 tablet by mouth daily. (Patient not taking: Reported on 01/03/2024)     finasteride  (PROSCAR ) 5 MG tablet Take 5 mg by mouth daily. (Patient not taking: Reported on 01/03/2024)     No facility-administered medications prior to visit.    PAST MEDICAL HISTORY: Past Medical History:  Diagnosis Date   ABNORMAL ELECTROCARDIOGRAM 06/21/2007   ALLERGIC RHINITIS 03/23/2007   Anxiety state, unspecified 09/06/2013   ASTHMATIC BRONCHITIS, ACUTE 04/27/2007  BENIGN PROSTATIC HYPERTROPHY 03/23/2007   BRONCHITIS NOT SPECIFIED AS ACUTE OR CHRONIC 04/21/2007   BURSITIS, RIGHT HIP 07/31/2008   CKD (chronic kidney disease)    COPD (chronic obstructive pulmonary disease) (HCC) 04/19/2016   Cough 01/29/2009   Dementia (HCC) 09/01/2010   ERECTILE DYSFUNCTION 03/23/2007   Esophageal stricture    Falls frequently    FREQUENCY, URINARY 04/26/2010   GERD 03/23/2007   HIATAL HERNIA    HYPERLIPIDEMIA 03/23/2007   HYPERTENSION 03/23/2007   MILD COGNITIVE IMPAIRMENT SO STATED 05/19/2010   NEPHROLITHIASIS, HX OF 03/23/2007   Nocturnal confusion 12/04/2023   Daughter states sun  downs at night   PSA, INCREASED 06/27/2008   REACTIVE AIRWAY DISEASE 01/14/2010   SCHATZKI'S RING    SCIATICA, RIGHT 04/28/2008   Unspecified hypothyroidism 09/06/2013   Vascular dementia (HCC)     PAST SURGICAL HISTORY: Past Surgical History:  Procedure Laterality Date   COLONOSCOPY WITH PROPOFOL  N/A 11/12/2022   Procedure: COLONOSCOPY WITH PROPOFOL ;  Surgeon: Wilhelmenia Aloha Raddle., MD;  Location: [facility];  Service: Gastroenterology;  Laterality: N/A;   ERCP N/A 12/28/2018   Procedure: ENDOSCOPIC RETROGRADE CHOLANGIOPANCREATOGRAPHY (ERCP);  Surgeon: Aneita Gwendlyn DASEN, MD;  Location: [facility];  Service: Endoscopy;  Laterality: N/A;   HEMOSTASIS CLIP PLACEMENT  11/12/2022   Procedure: HEMOSTASIS CLIP PLACEMENT;  Surgeon: Wilhelmenia Aloha Raddle., MD;  Location: [facility];  Service: Gastroenterology;;   POLYPECTOMY  11/12/2022   Procedure: POLYPECTOMY;  Surgeon: Wilhelmenia Aloha Raddle., MD;  Location: [facility];  Service: Gastroenterology;;   REMOVAL OF STONES  12/28/2018   Procedure: REMOVAL OF STONES;  Surgeon: Aneita Gwendlyn DASEN, MD;  Location: [facility];  Service: Endoscopy;;  balloon sweep   SPHINCTEROTOMY  12/28/2018   Procedure: SPHINCTEROTOMY;  Surgeon: Aneita Gwendlyn DASEN, MD;  Location: [facility];  Service: Endoscopy;;   TONSILLECTOMY      FAMILY HISTORY: Family History  Problem Relation Age of Onset   Stroke Mother    Asthma Sister    Lung cancer Brother        smoked   Emphysema Brother        smoked   Stroke Maternal Uncle    Cancer Other        lung cancer   Hypertension Other     SOCIAL HISTORY: Social History   Socioeconomic History   Marital status: Married    Spouse name: Lobbyist   Number of children: 2   Years of education: Not on file   Highest education level: Not on file  Occupational History   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    Employer: RETIRED  Tobacco Use   Smoking status: Former     Types: Cigarettes   Smokeless tobacco: Never   Tobacco comments:    tried smoking in HS  Vaping Use   Vaping status: Never Used  Substance and Sexual Activity   Alcohol use: No   Drug use: No   Sexual activity: Never  Other Topics Concern   Not on file  Social History Narrative   Lives with wife/2025   Retired    Social Drivers of Corporate investment banker Strain: Low Risk  (10/02/2023)   Overall Financial Resource Strain (CARDIA)    Difficulty of Paying Living Expenses: Not hard at all  Food Insecurity: No Food Insecurity (12/04/2023)   Hunger Vital Sign    Worried About Running Out of Food in the Last Year: Never true  Ran Out of Food in the Last Year: Never true  Transportation Needs: No Transportation Needs (12/04/2023)   PRAPARE - Administrator, Civil Service (Medical): No    Lack of Transportation (Non-Medical): No  Physical Activity: Inactive (10/02/2023)   Exercise Vital Sign    Days of Exercise per Week: 0 days    Minutes of Exercise per Session: 0 min  Stress: No Stress Concern Present (10/02/2023)   [employer] of Occupational Health - Occupational Stress Questionnaire    Feeling of Stress: Only a little  Social Connections: Moderately Isolated (12/04/2023)   Social Connection and Isolation Panel    Frequency of Communication with Friends and Family: More than three times a week    Frequency of Social Gatherings with Friends and Family: Twice a week    Attends Religious Services: Never    Database administrator or Organizations: No    Attends Banker Meetings: Never    Marital Status: Married  Catering manager Violence: Not At Risk (12/04/2023)   Humiliation, Afraid, Rape, and Kick questionnaire    Fear of Current or Ex-Partner: No    Emotionally Abused: No    Physically Abused: No    Sexually Abused: No      PHYSICAL EXAM  Vitals:   01/03/24 0943  BP: 121/64  Pulse: 92  Weight: 185 lb (83.9 kg)  Height: 6' (1.829 m)    Body mass index is 25.09 kg/m.     01/03/2024   10:43 AM 04/13/2017    4:04 PM  MMSE - Mini Mental State Exam  Orientation to time 2 5   Orientation to Place 4 5   Registration 3 3   Attention/ Calculation 1 5   Recall 2 1   Language- name 2 objects 2 2   Language- repeat 1 1  Language- follow 3 step command 3 3   Language- read & follow direction 1 1   Write a sentence 0 1   Copy design 0 1   Total score 19 28      Data saved with a previous flowsheet row definition     Generalized: Well developed, in no acute distress   Neurological examination  Mentation: Alert oriented to time, place, history taking. Follows all commands speech and language fluent Cranial nerve II-XII: Pupils were equal round reactive to light. Extraocular movements were full, visual field were full on confrontational test. Facial sensation and strength were normal. Uvula tongue midline. Head turning and shoulder shrug  were normal and symmetric. Motor: The motor testing reveals 5 over 5 strength in all extremities except 4-5 strength in the left lower extremity. Good symmetric motor tone is noted throughout.  Sensory: Sensory testing is intact to soft touch on all 4 extremities. No evidence of extinction is noted.  Coordination: Cerebellar testing reveals good finger-nose-finger and heel-to-shin bilaterally.  Gait and station: Patient is in a wheelchair today.  DIAGNOSTIC DATA (LABS, IMAGING, TESTING) - I reviewed patient records, labs, notes, testing and imaging myself where available.  Lab Results  Component Value Date   WBC 9.1 12/06/2023   HGB 9.5 (L) 12/06/2023   HCT 28.6 (L) 12/06/2023   MCV 96.3 12/06/2023   PLT 168 12/06/2023      Component Value Date/Time   NA 135 12/07/2023 0248   K 4.6 12/07/2023 0248   CL 104 12/07/2023 0248   CO2 21 (L) 12/07/2023 0248   GLUCOSE 110 (H) 12/07/2023 0248   BUN 47 (  H) 12/07/2023 0248   CREATININE 2.62 (H) 12/07/2023 0248   CREATININE 2.09 (H)  11/22/2019 1054   CALCIUM  8.0 (L) 12/07/2023 0248   PROT 5.4 (L) 12/04/2023 1256   ALBUMIN  2.9 (L) 12/04/2023 1256   AST 18 12/04/2023 1256   ALT 13 12/04/2023 1256   ALKPHOS 50 12/04/2023 1256   BILITOT 1.0 12/04/2023 1256   GFRNONAA 22 (L) 12/07/2023 0248   GFRNONAA 27 (L) 11/22/2019 1054   GFRAA 32 (L) 11/22/2019 1054   Lab Results  Component Value Date   CHOL 119 12/05/2023   HDL 55 12/05/2023   LDLCALC 49 12/05/2023   LDLDIRECT 74.0 04/25/2022   TRIG 76 12/05/2023   CHOLHDL 2.2 12/05/2023   Lab Results  Component Value Date   HGBA1C 6.3 (H) 12/05/2023   Lab Results  Component Value Date   VITAMINB12 447 06/01/2023   Lab Results  Component Value Date   TSH 5.55 (H) 11/22/2023      ASSESSMENT AND PLAN 89 y.o. year old male  has a past medical history of ABNORMAL ELECTROCARDIOGRAM (06/21/2007), ALLERGIC RHINITIS (03/23/2007), Anxiety state, unspecified (09/06/2013), ASTHMATIC BRONCHITIS, ACUTE (04/27/2007), BENIGN PROSTATIC HYPERTROPHY (03/23/2007), BRONCHITIS NOT SPECIFIED AS ACUTE OR CHRONIC (04/21/2007), BURSITIS, RIGHT HIP (07/31/2008), CKD (chronic kidney disease), COPD (chronic obstructive pulmonary disease) (HCC) (04/19/2016), Cough (01/29/2009), Dementia (HCC) (09/01/2010), ERECTILE DYSFUNCTION (03/23/2007), Esophageal stricture, Falls frequently, FREQUENCY, URINARY (04/26/2010), GERD (03/23/2007), HIATAL HERNIA, HYPERLIPIDEMIA (03/23/2007), HYPERTENSION (03/23/2007), MILD COGNITIVE IMPAIRMENT SO STATED (05/19/2010), NEPHROLITHIASIS, HX OF (03/23/2007), Nocturnal confusion (12/04/2023), PSA, INCREASED (06/27/2008), REACTIVE AIRWAY DISEASE (01/14/2010), SCHATZKI'S RING, SCIATICA, RIGHT (04/28/2008), Unspecified hypothyroidism (09/06/2013), and Vascular dementia (HCC). here with:  right MCA watershed distribution infarcts and several punctate left ACA and PCA infarcts, concerning for cardioembolic source Hyperlipdemia Memory disturbance   Continue aspirin  81 mg  daily and clopidogrel  75 mg daily  for secondary stroke prevention for 3 months and then aspirin  alone. Discussed secondary stroke prevention measures and importance of close PCP follow up for aggressive stroke risk factor management. I have gone over the pathophysiology of stroke, warning signs and symptoms, risk factors and their management in some detail with instructions to go to the closest emergency room for symptoms of concern. HTN: BP goal <130/90.   HLD: LDL goal <70. Recent LDL 49.  DMII: A1c goal<7.0. Recent A1c 6.3.  Encouraged to keep follow-up with cardiology later this month for cardiac monitor Encouraged to keep follow-up with vein and vascular to monitor for carotid stenosis Schedule follow-up with ophthalmology Encouraged patient to monitor diet  MMSE 19/30 will monitor for now. FU with our office 8 to 10 months or sooner if needed    Duwaine Russell, MSN, NP-C 01/03/2024, 9:56 AM [facility] [address] [place], [place] [address] [phone]  The patient's condition requires frequent monitoring and adjustments in the treatment plan, reflecting the ongoing complexity of care.  This provider is the continuing focal point for all needed services for this condition.

## 2024-01-04 NOTE — Progress Notes (Signed)
 I agree with the above plan

## 2024-01-10 DIAGNOSIS — I639 Cerebral infarction, unspecified: Secondary | ICD-10-CM | POA: Diagnosis not present

## 2024-01-10 DIAGNOSIS — I10 Essential (primary) hypertension: Secondary | ICD-10-CM | POA: Diagnosis not present

## 2024-01-10 DIAGNOSIS — R296 Repeated falls: Secondary | ICD-10-CM | POA: Diagnosis not present

## 2024-01-10 DIAGNOSIS — F0390 Unspecified dementia without behavioral disturbance: Secondary | ICD-10-CM | POA: Diagnosis not present

## 2024-01-10 DIAGNOSIS — R2681 Unsteadiness on feet: Secondary | ICD-10-CM | POA: Diagnosis not present

## 2024-01-11 ENCOUNTER — Telehealth: Payer: Self-pay

## 2024-01-11 NOTE — Transitions of Care (Post Inpatient/ED Visit) (Signed)
 01/11/2024  Name: Stephen Wilkins MRN: [id number] DOB: 1927-08-12  Today's TOC FU Call Status: Today's TOC FU Call Status:: Successful TOC FU Call Completed TOC FU Call Complete Date: 01/11/24 Patient's Name and Date of Birth confirmed.  Transition Care Management Follow-up Telephone Call Date of Discharge: 01/10/24 Discharge Facility: Other [facility]) Name of Other ([facility]) Discharge Facility: [facility] Type of Discharge: Inpatient Admission Primary Inpatient Discharge Diagnosis:: weakness How have you been since you were released from the hospital?: Better Any questions or concerns?: No  Items Reviewed: Did you receive and understand the discharge instructions provided?: Yes Medications obtained,verified, and reconciled?: Yes (Medications Reviewed) Any new allergies since your discharge?: No Dietary orders reviewed?: Yes Do you have support at home?: Yes People in Home [RPT]: spouse  Medications Reviewed Today: Medications Reviewed Today     Reviewed by Emmitt Pan, LPN (Licensed Practical Nurse) on 01/11/24 at [phone]  Med List Status: <None>   Medication Order Taking? Sig Documenting Provider Last Dose Status Informant  albuterol  (PROVENTIL ) (2.5 MG/3ML) 0.083% nebulizer solution [id number] Yes Take 3 mLs (2.5 mg total) by nebulization every 4 (four) hours as needed for wheezing or shortness of breath. Alvia Corean LITTIE, FNP  Active Child, Pharmacy Records  aspirin  EC 81 MG tablet [id number] Yes Take 1 tablet (81 mg total) by mouth daily. Swallow whole. [name] Ranks, MD  Active   clopidogrel  (PLAVIX ) 75 MG tablet [id number] Yes Take 1 tablet (75 mg total) by mouth daily. Pahwani, Ravi, MD  Active   DONEPEZIL  HCL PO [id number]  Take 1 tablet by mouth daily.  Patient not taking: Reported on 01/11/2024   [provider]  Active Child, Pharmacy Records           Med Note ROZENA, SEBASTIAN   Mon Dec 04, 2023  6:08 PM) Patient non-compliant with donepezil .  Has been prescribed both 5mg  and 10mg , unsure of which he is supposed to be taking.  finasteride  (PROSCAR ) 5 MG tablet [id number]  Take 5 mg by mouth daily.  Patient not taking: Reported on 01/11/2024   [provider]  Active Child, Pharmacy Records           Med Note ROZENA, SEBASTIAN   Mon Dec 04, 2023  6:05 PM) Pt/family have been holding med due to side effect concern. Pt has multiple falls after starting.  Fluticasone -Umeclidin-Vilant (TRELEGY ELLIPTA ) 100-62.5-25 MCG/ACT AEPB [id number] Yes Inhale 1 puff into the lungs daily. Norleen Lynwood ORN, MD  Active Child, Pharmacy Records  Multiple Vitamin (MULTIVITAMINS PO) [id number] Yes Take 1 tablet by mouth daily. [provider]  Active Pharmacy Records, Child  pantoprazole  (PROTONIX ) 40 MG tablet [id number] Yes Take 1 tablet (40 mg total) by mouth daily. Norleen Lynwood ORN, MD  Active Child, Pharmacy Records  polyethylene glycol powder (MIRALAX ) powder [id number] Yes Take 1/2 capful by mouth once daily  Patient taking differently: Take 8.5 g by mouth daily as needed for mild constipation. Take 1/2 capful by mouth once daily   Norleen Lynwood ORN, MD  Active Pharmacy Records, Child  rosuvastatin  (CRESTOR ) 10 MG tablet [id number] Yes Take 1 tablet (10 mg total) by mouth daily. Norleen Lynwood ORN, MD  Active Child, Pharmacy Records  solifenacin  (VESICARE ) 5 MG tablet [id number] Yes Take 1 tablet (5 mg total) by mouth daily. Norleen Lynwood ORN, MD  Active Child, Pharmacy Records  tamsulosin  (FLOMAX ) 0.4 MG CAPS capsule [id number] Yes Take 1 capsule (0.4 mg total) by mouth in the morning and at bedtime. Norleen Lynwood  W, MD  Active Child, Pharmacy Records            Home Care and Equipment/Supplies: Any new equipment or medical supplies ordered?: NA  Functional Questionnaire: Do you need assistance with bathing/showering or dressing?: Yes Do you need assistance with meal preparation?: Yes Do you need assistance with eating?: No Do you have difficulty  maintaining continence: Yes Do you need assistance with getting out of bed/getting out of a chair/moving?: Yes Do you have difficulty managing or taking your medications?: Yes  Follow up appointments reviewed: PCP Follow-up appointment confirmed?: Yes Date of PCP follow-up appointment?: 01/22/24 Follow-up Provider: [facility] Follow-up appointment confirmed?: NA Do you need transportation to your follow-up appointment?: No Do you understand care options if your condition(s) worsen?: Yes-patient verbalized understanding    SIGNATURE Julian Lemmings, LPN [facility] Nurse Health Advisor Direct Dial [phone]

## 2024-01-20 ENCOUNTER — Emergency Department (HOSPITAL_COMMUNITY)
Admission: EM | Admit: 2024-01-20 | Discharge: 2024-01-20 | Disposition: A | Discharge status: Home / Self Care | Attending: Emergency Medicine | Admitting: Emergency Medicine

## 2024-01-20 ENCOUNTER — Emergency Department (HOSPITAL_COMMUNITY)

## 2024-01-20 ENCOUNTER — Encounter (HOSPITAL_COMMUNITY): Payer: Self-pay

## 2024-01-20 ENCOUNTER — Other Ambulatory Visit: Payer: Self-pay

## 2024-01-20 DIAGNOSIS — N189 Chronic kidney disease, unspecified: Secondary | ICD-10-CM | POA: Insufficient documentation

## 2024-01-20 DIAGNOSIS — E039 Hypothyroidism, unspecified: Secondary | ICD-10-CM | POA: Insufficient documentation

## 2024-01-20 DIAGNOSIS — I129 Hypertensive chronic kidney disease with stage 1 through stage 4 chronic kidney disease, or unspecified chronic kidney disease: Secondary | ICD-10-CM | POA: Insufficient documentation

## 2024-01-20 DIAGNOSIS — F0150 Vascular dementia without behavioral disturbance: Secondary | ICD-10-CM | POA: Insufficient documentation

## 2024-01-20 DIAGNOSIS — J45909 Unspecified asthma, uncomplicated: Secondary | ICD-10-CM | POA: Insufficient documentation

## 2024-01-20 DIAGNOSIS — Z7982 Long term (current) use of aspirin: Secondary | ICD-10-CM | POA: Diagnosis not present

## 2024-01-20 DIAGNOSIS — R079 Chest pain, unspecified: Secondary | ICD-10-CM | POA: Diagnosis present

## 2024-01-20 DIAGNOSIS — Z743 Need for continuous supervision: Secondary | ICD-10-CM | POA: Diagnosis not present

## 2024-01-20 DIAGNOSIS — I700 Atherosclerosis of aorta: Secondary | ICD-10-CM | POA: Diagnosis not present

## 2024-01-20 DIAGNOSIS — R0789 Other chest pain: Secondary | ICD-10-CM | POA: Insufficient documentation

## 2024-01-20 DIAGNOSIS — J449 Chronic obstructive pulmonary disease, unspecified: Secondary | ICD-10-CM | POA: Diagnosis not present

## 2024-01-20 LAB — CBC WITH DIFFERENTIAL/PLATELET
Abs Immature Granulocytes: 0.02 K/uL (ref 0.00–0.07)
Basophils Absolute: 0.1 K/uL (ref 0.0–0.1)
Basophils Relative: 1 %
Eosinophils Absolute: 1 K/uL — ABNORMAL HIGH (ref 0.0–0.5)
Eosinophils Relative: 10 %
HCT: 29.7 % — ABNORMAL LOW (ref 39.0–52.0)
Hemoglobin: 9.2 g/dL — ABNORMAL LOW (ref 13.0–17.0)
Immature Granulocytes: 0 %
Lymphocytes Relative: 18 %
Lymphs Abs: 1.8 K/uL (ref 0.7–4.0)
MCH: 31.2 pg (ref 26.0–34.0)
MCHC: 31 g/dL (ref 30.0–36.0)
MCV: 100.7 fL — ABNORMAL HIGH (ref 80.0–100.0)
Monocytes Absolute: 1.2 K/uL — ABNORMAL HIGH (ref 0.1–1.0)
Monocytes Relative: 12 %
Neutro Abs: 6 K/uL (ref 1.7–7.7)
Neutrophils Relative %: 59 %
Platelets: 290 K/uL (ref 150–400)
RBC: 2.95 MIL/uL — ABNORMAL LOW (ref 4.22–5.81)
RDW: 12.9 % (ref 11.5–15.5)
WBC: 10 K/uL (ref 4.0–10.5)
nRBC: 0 % (ref 0.0–0.2)

## 2024-01-20 LAB — BASIC METABOLIC PANEL WITH GFR
Anion gap: 11 (ref 5–15)
BUN: 39 mg/dL — ABNORMAL HIGH (ref 8–23)
CO2: 25 mmol/L (ref 22–32)
Calcium: 9 mg/dL (ref 8.9–10.3)
Chloride: 103 mmol/L (ref 98–111)
Creatinine, Ser: 2.31 mg/dL — ABNORMAL HIGH (ref 0.61–1.24)
GFR, Estimated: 26 mL/min — ABNORMAL LOW (ref >60)
Glucose, Bld: 110 mg/dL — ABNORMAL HIGH (ref 70–99)
Potassium: 4.6 mmol/L (ref 3.5–5.1)
Sodium: 139 mmol/L (ref 135–145)

## 2024-01-20 LAB — TROPONIN I (HIGH SENSITIVITY)
Troponin I (High Sensitivity): 11 ng/L (ref <18)
Troponin I (High Sensitivity): 11 ng/L (ref <18)

## 2024-01-20 LAB — HEPATIC FUNCTION PANEL
ALT: 11 U/L (ref 0–44)
AST: 18 U/L (ref 15–41)
Albumin: 2.9 g/dL — ABNORMAL LOW (ref 3.5–5.0)
Alkaline Phosphatase: 65 U/L (ref 38–126)
Bilirubin, Direct: 0.2 mg/dL (ref 0.0–0.2)
Indirect Bilirubin: 0.6 mg/dL (ref 0.3–0.9)
Total Bilirubin: 0.8 mg/dL (ref 0.0–1.2)
Total Protein: 6.4 g/dL — ABNORMAL LOW (ref 6.5–8.1)

## 2024-01-20 LAB — D-DIMER, QUANTITATIVE: D-Dimer, Quant: 2.74 ug{FEU}/mL — ABNORMAL HIGH (ref 0.00–0.50)

## 2024-01-20 LAB — LIPASE, BLOOD: Lipase: 50 U/L (ref 11–51)

## 2024-01-20 MED ORDER — TECHNETIUM TO 99M ALBUMIN AGGREGATED
4.4000 | Freq: Once | INTRAVENOUS | Status: AC
  Administered 2024-01-20: 4.4 via INTRAVENOUS

## 2024-01-20 NOTE — ED Notes (Signed)
Called [facility] for pt transport  

## 2024-01-20 NOTE — ED Notes (Signed)
 [facility] Nuclear Medicine Tech [phone]

## 2024-01-20 NOTE — ED Provider Notes (Signed)
  Physical Exam  BP (!) 158/82 (BP Location: Left Arm)   Pulse 81   Temp 98 F (36.7 C) (Oral)   Resp 18   Ht 6' (1.829 m)   Wt 79.4 kg   SpO2 100%   BMI 23.73 kg/m   Physical Exam  Procedures  Procedures  ED Course / MDM   Clinical Course as of 01/20/24 1449  Sat Jan 20, 2024  1020 Nuclear medicine is down at [facility] for weekend.. can't do VQ scan here.  [JB]  1155 Dimer positive.  [JB]    Clinical Course User Index [JB] Barrett, Warren SAILOR, PA-C   Medical Decision Making Amount and/or Complexity of Data Reviewed Labs: ordered. Radiology: ordered.   Transferred from [facility] for perfusion scan of lungs.  Positive dimer.  If negative should be able to discharge home.       Patsey Lot, MD 01/20/24 1450

## 2024-01-20 NOTE — ED Notes (Signed)
 [facility] ED contact for report was attempted on multiple #'s, unable to connect, [facility] transporting patient, report given to transport

## 2024-01-20 NOTE — ED Provider Notes (Addendum)
   ED Course / MDM   Clinical Course as of 01/20/24 1617  Sat Jan 20, 2024  1020 Nuclear medicine is down at [facility] for weekend.. can't do VQ scan here.  [JB]  1155 Dimer positive.  [JB]  1507 Received sign out from Dr. Patsey pending VQ scan. Anticipate d/c if negative. [WS]  1612 Currently denying any symptoms.  VQ scan read as low to intermediate probability.  His vital signs have been normal without tachycardia or hypoxia.  He apparently never complained of shortness of breath or pleuritic pain.  His troponin was normal.  Think the overall chance that he has pulmonary embolism is quite low based on lack of symptoms and reassuring vital signs, low to intermediate probability study.  Patient already 56, has dementia and trouble ambulation, high risk of fall, not sure if the benefits of anticoagulation with outweigh risks even he did have a small PE.  Discussed results with son.  He is comfortable taking the patient home.  Advise returning if he has any new symptoms.  Request that we help with outpatient social work as he is trying to place the patient.  Will place referral. Patient also has cardiology follow up in 4 days.  [WS]    Clinical Course User Index [JB] Barrett, Warren SAILOR, PA-C [WS] Francesca Elsie CROME, MD   Medical Decision Making Amount and/or Complexity of Data Reviewed Labs: ordered. Radiology: ordered.         Francesca Elsie CROME, MD 01/20/24 1616    Francesca Elsie CROME, MD 01/20/24 [phone]

## 2024-01-20 NOTE — Discharge Instructions (Signed)
 Cardiac work-up today was reassuring. Recommend close follow-up with your primary care doctor. Return here for new concerns.

## 2024-01-20 NOTE — ED Provider Notes (Signed)
  Physical Exam  BP 132/82   Pulse 88   Temp 98 F (36.7 C) (Oral)   Resp 20   Ht 6' (1.829 m)   Wt 79.4 kg   SpO2 98%   BMI 23.73 kg/m   Physical Exam Constitutional:      Comments: Patient is pleasantly confused.  Cardiovascular:     Rate and Rhythm: Normal rate and regular rhythm.  Pulmonary:     Effort: Pulmonary effort is normal.     Breath sounds: Normal breath sounds.  Abdominal:     General: Abdomen is flat. Bowel sounds are normal.     Palpations: Abdomen is soft.  Musculoskeletal:     Right lower leg: No edema.     Left lower leg: No edema.     Procedures  Procedures  ED Course / MDM   Clinical Course as of 01/20/24 1204  Sat Jan 20, 2024  1020 Nuclear medicine is down at [facility] for weekend.. can't do VQ scan here.  [JB]  1155 Dimer positive.  [JB]    Clinical Course User Index [JB] Arion Shankles, Warren SAILOR, PA-C   Medical Decision Making Amount and/or Complexity of Data Reviewed Labs: ordered. Radiology: ordered.   Patient with past medical history of GERD hypertension hyperlipidemia vascular dementia chronic kidney disease and COPD is presenting to emergency room today with chest pain.  Please see prior ED provider Olam RIGGERS for prior note.  In short patient's discharge is pending a VQ scan.  At this time as he is stable and chest pain-free it is felt chest pain is appropriate as outpatient workup but does require VQ scan prior to being discharged.  Unfortunately our V/Q scans are down at [facility] for the weekend.  I did contact [facility] nuclear medicine tech Addison Cleotilde who reports we can get this study done today at [facility] if he were to get transferred there.  I spoke with the patient and the family who request that patient be transferred for a V/Q study, they do not want for him to have a CT angio of the chest.  Spoke with patient's [name] who wanted patient transferred to [facility] to have V/Q study done.  She understands that patient  will be transported via transport to [facility].  I spoke with Dr. Patsey who accepts patient for transfer.  The plan is to transfer ED to ED for V/Q study.  If V/Q study is negative, anticipated to be stable for discharge home at this time.      Shermon Warren SAILOR, PA-C 01/20/24 1306    Bari Charmaine FALCON, MD 01/28/24 [phone]

## 2024-01-20 NOTE — ED Provider Notes (Signed)
 Stephen Wilkins Provider Note   CSN: [id number] Arrival date & time: 01/20/24  0241     Patient presents with: Chest Pain   Stephen Wilkins is a 89 y.o. male.   The history is provided by the patient and medical records.  Chest Pain  89 year old male with history of BPH, GERD, hypertension, hypothyroidism, vascular dementia, asthma, hyperlipidemia, anxiety, chronic kidney disease, COPD, chronically elevated LFTs, presenting to the ED with chest pain.  States he started noticing this a little bit yesterday afternoon, thought maybe it would get better with rest but it did not.  Woke up around 1 AM with a heartburn type sensation in the chest, drank some water and went back to sleep but woke up again around 230 with recurrent symptoms so called EMS.  He was given aspirin  by EMS and reports symptoms seem much better now.  He denies any cough, shortness of breath, fever, chills, nausea, vomiting, or diarrhea.  Prior to Admission medications   Medication Sig Start Date End Date Taking? Authorizing Provider  albuterol  (PROVENTIL ) (2.5 MG/3ML) 0.083% nebulizer solution Take 3 mLs (2.5 mg total) by nebulization every 4 (four) hours as needed for wheezing or shortness of breath. 11/28/23   Alvia Corean LITTIE, FNP  aspirin  EC 81 MG tablet Take 1 tablet (81 mg total) by mouth daily. Swallow whole. 12/08/23 03/07/24  [name] Ranks, MD  clopidogrel  (PLAVIX ) 75 MG tablet Take 1 tablet (75 mg total) by mouth daily. 12/08/23 03/05/24  [name] Ranks, MD  DONEPEZIL  HCL PO Take 1 tablet by mouth daily. Patient not taking: Reported on 01/11/2024    [provider]  finasteride  (PROSCAR ) 5 MG tablet Take 5 mg by mouth daily. Patient not taking: Reported on 01/11/2024 11/17/23   [provider]  Fluticasone -Umeclidin-Vilant (TRELEGY ELLIPTA ) 100-62.5-25 MCG/ACT AEPB Inhale 1 puff into the lungs daily. 06/01/23   Norleen Lynwood ORN, MD  Multiple Vitamin (MULTIVITAMINS  PO) Take 1 tablet by mouth daily. 03/02/22   [provider]  pantoprazole  (PROTONIX ) 40 MG tablet Take 1 tablet (40 mg total) by mouth daily. 06/01/23   Norleen Lynwood ORN, MD  polyethylene glycol powder (MIRALAX ) powder Take 1/2 capful by mouth once daily Patient taking differently: Take 8.5 g by mouth daily as needed for mild constipation. Take 1/2 capful by mouth once daily 01/28/15   Norleen Lynwood ORN, MD  rosuvastatin  (CRESTOR ) 10 MG tablet Take 1 tablet (10 mg total) by mouth daily. 06/01/23   Norleen Lynwood ORN, MD  solifenacin  (VESICARE ) 5 MG tablet Take 1 tablet (5 mg total) by mouth daily. 06/01/23   Norleen Lynwood ORN, MD  tamsulosin  (FLOMAX ) 0.4 MG CAPS capsule Take 1 capsule (0.4 mg total) by mouth in the morning and at bedtime. 06/01/23   Norleen Lynwood ORN, MD    Allergies: Atorvastatin , Doxazosin mesylate, Hydrocodone  bit-homatrop mbr, and Hydrocodone     Review of Systems  Cardiovascular:  Positive for chest pain.  All other systems reviewed and are negative.   Updated Vital Signs BP 134/65   Pulse 79   Resp 16   Ht 6' (1.829 m)   Wt 79.4 kg   SpO2 100%   BMI 23.73 kg/m   Physical Exam Vitals and nursing note reviewed.  Constitutional:      Appearance: He is well-developed.  HENT:     Head: Normocephalic and atraumatic.  Eyes:     Conjunctiva/sclera: Conjunctivae normal.     Pupils: Pupils are equal, round,  and reactive to light.  Cardiovascular:     Rate and Rhythm: Normal rate and regular rhythm.     Heart sounds: Normal heart sounds.  Pulmonary:     Effort: Pulmonary effort is normal.     Breath sounds: Normal breath sounds. No wheezing or rhonchi.  Abdominal:     General: Bowel sounds are normal.     Palpations: Abdomen is soft.  Musculoskeletal:        General: Normal range of motion.     Cervical back: Normal range of motion.  Skin:    General: Skin is warm and dry.  Neurological:     Mental Status: He is alert and oriented to person, place, and time.      (all labs ordered are listed, but only abnormal results are displayed) Labs Reviewed  CBC WITH DIFFERENTIAL/PLATELET - Abnormal; Notable for the following components:      Result Value   RBC 2.95 (*)    Hemoglobin 9.2 (*)    HCT 29.7 (*)    MCV 100.7 (*)    Monocytes Absolute 1.2 (*)    Eosinophils Absolute 1.0 (*)    All other components within normal limits  BASIC METABOLIC PANEL WITH GFR - Abnormal; Notable for the following components:   Glucose, Bld 110 (*)    BUN 39 (*)    Creatinine, Ser 2.31 (*)    GFR, Estimated 26 (*)    All other components within normal limits  HEPATIC FUNCTION PANEL - Abnormal; Notable for the following components:   Total Protein 6.4 (*)    Albumin  2.9 (*)    All other components within normal limits  LIPASE, BLOOD  TROPONIN I (HIGH SENSITIVITY)  TROPONIN I (HIGH SENSITIVITY)    EKG: None  Radiology: DG Chest 2 View Result Date: 01/20/2024 EXAM: 2 VIEW(S) XRAY OF THE CHEST 01/20/2024 03:09:00 AM COMPARISON: 12/04/2023 CLINICAL HISTORY: chest pain. Chest pain FINDINGS: LUNGS AND PLEURA: No focal pulmonary opacity. No pulmonary edema. No pleural effusion. No pneumothorax. HEART AND MEDIASTINUM: Aortic atherosclerosis. No acute abnormality of the cardiac and mediastinal silhouettes. BONES AND SOFT TISSUES: Thoracic degenerative changes. No acute osseous abnormality. IMPRESSION: 1. No acute findings. 2. Aortic atherosclerosis and thoracic degenerative changes. Electronically signed by: Evalene Coho MD 01/20/2024 04:29 AM EDT RP Workstation: [id number]     Procedures   Medications Ordered in the ED - No data to display  Clinical Course as of 01/21/24 0240  Sat Jan 20, 2024  1020 Nuclear medicine is down at [facility] for weekend.. can't do VQ scan here.  [JB]  1155 Dimer positive.  [JB]  1507 Received sign out from Dr. Patsey pending VQ scan. Anticipate d/c if negative. [WS]  1612 Currently denying any symptoms.  VQ scan read as low to  intermediate probability.  His vital signs have been normal without tachycardia or hypoxia.  He apparently never complained of shortness of breath or pleuritic pain.  His troponin was normal.  Think the overall chance that he has pulmonary embolism is quite low based on lack of symptoms and reassuring vital signs, low to intermediate probability study.  Patient already 49, has dementia and trouble ambulation, high risk of fall, not sure if the benefits of anticoagulation with outweigh risks even he did have a small PE.  Discussed results with son.  He is comfortable taking the patient home.  Advise returning if he has any new symptoms.  Request that we help with outpatient social work as he is trying  to place the patient.  Will place referral. Patient also has cardiology follow up in 4 days.  [WS]    Clinical Course User Index [JB] Barrett, Warren SAILOR, PA-C [WS] Francesca Elsie CROME, MD                                 Medical Decision Making Amount and/or Complexity of Data Reviewed Labs: ordered. Radiology: ordered and independent interpretation performed. ECG/medicine tests: ordered and independent interpretation performed.   89 year old male presenting to the ED with chest pain.  Noticed this a little yesterday afternoon, persisted but went on to bed as normal.  Woke around 1 AM and felt like he had a lot of indigestion, present again at 0230 and called EMS.  Pain resolved after ASA with EMS.  Denies prior cardiac hx.  EKG here is non-ischemic.  He remains pain free on my exam.  Her vitals are stable.  Labs as above--no leukocytosis.  Hemoglobin is stable at 9.2.  Creatinine is 2.31 which is around baseline compared with prior.  Troponin negative x 2.  Chest x-ray without any acute findings.  Results discussed with wife at bedside, patient is sleeping soundly.  She expresses concern for possible blood clot and asked about observation.  Patient is not tachycardic, nor hypoxic.  Did have recent  stroke, currently on ASA and plavix .  Was just discharged from his SNF.  Unfortunately, cannot get CTA due to his renal function.  VQ has been ordered.  If VQ is reassuring and patient remains stable, feel he would be appropriate for OP work-up.  Can refer to cardiology if desired. su Final diagnoses:  Chest pain in adult    ED Discharge Orders     None          Jarold Olam HERO, PA-C 01/21/24 0240    Bari Charmaine FALCON, MD 01/28/24 2312

## 2024-01-20 NOTE — ED Triage Notes (Signed)
 Pt bib ems d/t cp. Pt stated that he was having cp tonight but denies pain now. Denies SOB. Stated he is feeling better now

## 2024-01-22 ENCOUNTER — Telehealth: Payer: Self-pay | Admitting: Internal Medicine

## 2024-01-22 ENCOUNTER — Ambulatory Visit (INDEPENDENT_AMBULATORY_CARE_PROVIDER_SITE_OTHER): Admitting: Internal Medicine

## 2024-01-22 ENCOUNTER — Encounter: Payer: Self-pay | Admitting: Internal Medicine

## 2024-01-22 ENCOUNTER — Telehealth: Payer: Self-pay

## 2024-01-22 VITALS — BP 122/74 | HR 85 | Temp 98.6°F | Ht 72.0 in | Wt 184.0 lb

## 2024-01-22 DIAGNOSIS — R531 Weakness: Secondary | ICD-10-CM

## 2024-01-22 DIAGNOSIS — F01B4 Vascular dementia, moderate, with anxiety: Secondary | ICD-10-CM | POA: Diagnosis not present

## 2024-01-22 DIAGNOSIS — S92902A Unspecified fracture of left foot, initial encounter for closed fracture: Secondary | ICD-10-CM

## 2024-01-22 DIAGNOSIS — R8290 Unspecified abnormal findings in urine: Secondary | ICD-10-CM

## 2024-01-22 DIAGNOSIS — J411 Mucopurulent chronic bronchitis: Secondary | ICD-10-CM

## 2024-01-22 DIAGNOSIS — Z23 Encounter for immunization: Secondary | ICD-10-CM | POA: Diagnosis not present

## 2024-01-22 DIAGNOSIS — Z8673 Personal history of transient ischemic attack (TIA), and cerebral infarction without residual deficits: Secondary | ICD-10-CM

## 2024-01-22 NOTE — Telephone Encounter (Signed)
 Patient's daughter came into the office this afternoon stating that the patient's CNA informed her that Stephen Wilkins's urine has been cloudy. She asked if Dr Norleen would be able to order a urine sample?  Please advise.  (They were given a cup to take home for collection and bring back.)

## 2024-01-22 NOTE — Telephone Encounter (Signed)
 Does patient need an OV or are they supposed to come in for a lab visit?  Copied from CRM [id number]. Topic: Appointments - Scheduling Inquiry for Clinic >> Jan 22, 2024  3:00 PM Jasmin G wrote: Reason for CRM: Pt requested to have a urinalysis done. Call pt back at [phone] to schedule.

## 2024-01-22 NOTE — Patient Instructions (Addendum)
 You had the flu shot today  Please continue all other medications as before, and refills have been done if requested.  Please have the pharmacy call with any other refills you may need.  Please continue your efforts at being more active, low cholesterol diet, and weight control.  Please keep your appointments with your specialists as you may have planned  You will be contacted regarding the referral for: [facility] , and Home Health with RV, PT, OT  Please make an Appointment to return in 4 months, or sooner if needed

## 2024-01-22 NOTE — Telephone Encounter (Signed)
 Ok labs are ordered for UA and culture

## 2024-01-22 NOTE — Progress Notes (Unsigned)
 Patient ID: Stephen Wilkins, male   DOB: 08-04-1927, 89 y.o.   MRN: [id number]        Chief Complaint: follow up history of stroke sept 1 2025 - sept 5 2025, left arm IV retention after V/Q scan at ED, left foot fracture at rehab       HPI:  Stephen Wilkins is a 89 y.o. male here with  family with history of stroke after falling with gash to head and black eye, found with LLE weakness and confirmed by MRI.  Also had AKI on CKD.  Patient was seen by neurology for the right MCA watershed distribution infarcts and several punctate L ACA and PCA infarcts concerning for cardioembolic source. Recommending DAPT x3 months, then aspirin  81 mg alone.  Crestor  10 mg.  For carotid stenosis, needs outpatient vascular surgery follow up. Losartan  was stopped  Pt able for d/c to rehab where unfortunately he somehow experienced a left foot fracture, wife states was xray proven, and asks for ortho followup. To see card for monitor wed for monitor placement Pt denies chest pain, increased sob or doe, wheezing, orthopnea, PND, increased LE swelling, palpitations, dizziness or syncope.   Pt denies polydipsia, polyuria, pt does have generalized weakness, dementia , hx of stroke likely to need HH with RN, PT, OT.  Incidentally pt arrived here with left antecubital IV retained after left the V/Q scan 2 days ago after seen at ED for chest pain.  IV removed today per RN, no s/s infection or thrombosis  Due for flu shot  Wife also noted cloudy urine x 2 days - asks for UA,  Denies urinary symptoms such as dysuria, frequency, urgency, flank pain, hematuria or n/v, fever.        Wt Readings from Last 3 Encounters:  01/22/24 184 lb (83.5 kg)  01/20/24 175 lb (79.4 kg)  01/03/24 185 lb (83.9 kg)   BP Readings from Last 3 Encounters:  01/22/24 122/74  01/20/24 (!) 147/80  01/03/24 121/64         Past Medical History:  Diagnosis Date   ABNORMAL ELECTROCARDIOGRAM 06/21/2007   ALLERGIC RHINITIS 03/23/2007   Anxiety state, unspecified  09/06/2013   ASTHMATIC BRONCHITIS, ACUTE 04/27/2007   BENIGN PROSTATIC HYPERTROPHY 03/23/2007   BRONCHITIS NOT SPECIFIED AS ACUTE OR CHRONIC 04/21/2007   BURSITIS, RIGHT HIP 07/31/2008   CKD (chronic kidney disease)    COPD (chronic obstructive pulmonary disease) (HCC) 04/19/2016   Cough 01/29/2009   Dementia (HCC) 09/01/2010   ERECTILE DYSFUNCTION 03/23/2007   Esophageal stricture    Falls frequently    FREQUENCY, URINARY 04/26/2010   GERD 03/23/2007   HIATAL HERNIA    HYPERLIPIDEMIA 03/23/2007   HYPERTENSION 03/23/2007   MILD COGNITIVE IMPAIRMENT SO STATED 05/19/2010   NEPHROLITHIASIS, HX OF 03/23/2007   Nocturnal confusion 12/04/2023   Daughter states sun downs at night   PSA, INCREASED 06/27/2008   REACTIVE AIRWAY DISEASE 01/14/2010   SCHATZKI'S RING    SCIATICA, RIGHT 04/28/2008   Unspecified hypothyroidism 09/06/2013   Vascular dementia [facility])    Past Surgical History:  Procedure Laterality Date   COLONOSCOPY WITH PROPOFOL  N/A 11/12/2022   Procedure: COLONOSCOPY WITH PROPOFOL ;  Surgeon: Wilhelmenia Aloha Raddle., MD;  Location: [facility];  Service: Gastroenterology;  Laterality: N/A;   ERCP N/A 12/28/2018   Procedure: ENDOSCOPIC RETROGRADE CHOLANGIOPANCREATOGRAPHY (ERCP);  Surgeon: Aneita Gwendlyn DASEN, MD;  Location: [facility];  Service: Endoscopy;  Laterality: N/A;   HEMOSTASIS CLIP PLACEMENT  11/12/2022   Procedure:  HEMOSTASIS CLIP PLACEMENT;  Surgeon: Mansouraty, Aloha Raddle., MD;  Location: [facility];  Service: Gastroenterology;;   POLYPECTOMY  11/12/2022   Procedure: POLYPECTOMY;  Surgeon: Wilhelmenia Aloha Raddle., MD;  Location: [facility];  Service: Gastroenterology;;   REMOVAL OF STONES  12/28/2018   Procedure: REMOVAL OF STONES;  Surgeon: Aneita Gwendlyn DASEN, MD;  Location: [facility];  Service: Endoscopy;;  balloon sweep   SPHINCTEROTOMY  12/28/2018   Procedure: SPHINCTEROTOMY;  Surgeon: Aneita Gwendlyn DASEN, MD;  Location: [facility];  Service: Endoscopy;;    TONSILLECTOMY      reports that he has quit smoking. His smoking use included cigarettes. He has never used smokeless tobacco. He reports that he does not drink alcohol and does not use drugs. family history includes Asthma in his sister; Cancer in an other family member; Emphysema in his brother; Hypertension in an other family member; Lung cancer in his brother; Stroke in his maternal uncle and mother. Allergies  Allergen Reactions   Atorvastatin  Other (See Comments)    REACTION: myalgia   Doxazosin Mesylate Other (See Comments)    REACTION: dizziness   Hydrocodone  Bit-Homatrop Mbr Other (See Comments)    anxiety   Hydrocodone  Anxiety   Current Outpatient Medications on File Prior to Visit  Medication Sig Dispense Refill   albuterol  (PROVENTIL ) (2.5 MG/3ML) 0.083% nebulizer solution Take 3 mLs (2.5 mg total) by nebulization every 4 (four) hours as needed for wheezing or shortness of breath. 150 mL 0   aspirin  EC 81 MG tablet Take 1 tablet (81 mg total) by mouth daily. Swallow whole. 90 tablet 0   clopidogrel  (PLAVIX ) 75 MG tablet Take 1 tablet (75 mg total) by mouth daily. 88 tablet 0   donepezil  (ARICEPT ) 5 MG tablet Take 5 mg by mouth daily.     finasteride  (PROSCAR ) 5 MG tablet Take 5 mg by mouth daily.     Fluticasone -Umeclidin-Vilant (TRELEGY ELLIPTA ) 100-62.5-25 MCG/ACT AEPB Inhale 1 puff into the lungs daily. (Patient taking differently: Inhale 1 puff into the lungs daily as needed (for wheezing and shortness of breath).) 1 each 11   pantoprazole  (PROTONIX ) 40 MG tablet Take 1 tablet (40 mg total) by mouth daily. 90 tablet 3   polyethylene glycol powder (MIRALAX ) powder Take 1/2 capful by mouth once daily 850 g 5   rosuvastatin  (CRESTOR ) 10 MG tablet Take 1 tablet (10 mg total) by mouth daily. 90 tablet 3   solifenacin  (VESICARE ) 5 MG tablet Take 1 tablet (5 mg total) by mouth daily. 90 tablet 3   tamsulosin  (FLOMAX ) 0.4 MG CAPS capsule Take 1 capsule (0.4 mg total) by mouth in the  morning and at bedtime. 180 capsule 3   No current facility-administered medications on file prior to visit.        ROS:  All others reviewed and negative.  Objective        PE:  BP 122/74 (BP Location: Right Arm, Patient Position: Sitting, Cuff Size: Normal)   Pulse 85   Temp 98.6 F (37 C) (Oral)   Ht 6' (1.829 m)   Wt 184 lb (83.5 kg)   SpO2 98%   BMI 24.95 kg/m                 Constitutional: Pt appears in NAD               HENT: Head: NCAT.                Right Ear: External ear normal.  Left Ear: External ear normal.                Eyes: . Pupils are equal, round, and reactive to light. Conjunctivae and EOM are normal               Nose: without d/c or deformity               Neck: Neck supple. Gross normal ROM               Cardiovascular: Normal rate and regular rhythm.                 Pulmonary/Chest: Effort normal and breath sounds without rales or wheezing.                Abd:  Soft, NT, ND, + BS, no organomegaly               Neurological: Pt is alert. At baseline orientation, motor grossly intact               Skin: Skin is warm. No rashes, no other new lesions, LE edema - trace pedal               Psychiatric: Pt behavior is normal without agitation   Micro: none  Cardiac tracings I have personally interpreted today:  none  Pertinent Radiological findings (summarize): none   Lab Results  Component Value Date   WBC 10.0 01/20/2024   HGB 9.2 (L) 01/20/2024   HCT 29.7 (L) 01/20/2024   PLT 290 01/20/2024   GLUCOSE 110 (H) 01/20/2024   CHOL 119 12/05/2023   TRIG 76 12/05/2023   HDL 55 12/05/2023   LDLDIRECT 74.0 04/25/2022   LDLCALC 49 12/05/2023   ALT 11 01/20/2024   AST 18 01/20/2024   NA 139 01/20/2024   K 4.6 01/20/2024   CL 103 01/20/2024   CREATININE 2.31 (H) 01/20/2024   BUN 39 (H) 01/20/2024   CO2 25 01/20/2024   TSH 5.55 (H) 11/22/2023   PSA 3.94 03/12/2014   INR 1.1 11/12/2022   HGBA1C 6.3 (H) 12/05/2023   MICROALBUR 58.3  (H) 06/01/2023   Assessment/Plan:  Stephen Wilkins is a 83 y.o. White or Caucasian [1] male with  has a past medical history of ABNORMAL ELECTROCARDIOGRAM (06/21/2007), ALLERGIC RHINITIS (03/23/2007), Anxiety state, unspecified (09/06/2013), ASTHMATIC BRONCHITIS, ACUTE (04/27/2007), BENIGN PROSTATIC HYPERTROPHY (03/23/2007), BRONCHITIS NOT SPECIFIED AS ACUTE OR CHRONIC (04/21/2007), BURSITIS, RIGHT HIP (07/31/2008), CKD (chronic kidney disease), COPD (chronic obstructive pulmonary disease) (HCC) (04/19/2016), Cough (01/29/2009), Dementia (HCC) (09/01/2010), ERECTILE DYSFUNCTION (03/23/2007), Esophageal stricture, Falls frequently, FREQUENCY, URINARY (04/26/2010), GERD (03/23/2007), HIATAL HERNIA, HYPERLIPIDEMIA (03/23/2007), HYPERTENSION (03/23/2007), MILD COGNITIVE IMPAIRMENT SO STATED (05/19/2010), NEPHROLITHIASIS, HX OF (03/23/2007), Nocturnal confusion (12/04/2023), PSA, INCREASED (06/27/2008), REACTIVE AIRWAY DISEASE (01/14/2010), SCHATZKI'S RING, SCIATICA, RIGHT (04/28/2008), Unspecified hypothyroidism (09/06/2013), and Vascular dementia (HCC).  Dementia (HCC) Stable, continue aricept   COPD (chronic obstructive pulmonary disease) (HCC) Stable, cont inhaler prn  History of stroke Patient was seen by neurology for the right MCA watershed distribution infarcts and several punctate L ACA and PCA infarcts concerning for cardioembolic source. Recommending DAPT x3 months, then aspirin  81 mg alone.  Crestor  10 mg.  Stable today  Generalized weakness Pt also for HH with RN, PT OT  Cloudy urine Asympt but can't r/o uti  - for UA today  Closed fracture of bone of left foot Recent incidental at rehab for unclear reason per wife - now for ortho referral  Followup: Return  in about 4 months (around 05/24/2024).  Lynwood Rush, MD 01/24/2024 12:26 PM [facility]

## 2024-01-22 NOTE — Telephone Encounter (Signed)
 See other note

## 2024-01-23 ENCOUNTER — Encounter: Payer: Self-pay | Admitting: *Deleted

## 2024-01-23 ENCOUNTER — Ambulatory Visit: Payer: Self-pay | Admitting: Internal Medicine

## 2024-01-23 ENCOUNTER — Other Ambulatory Visit: Payer: Self-pay | Admitting: Internal Medicine

## 2024-01-23 DIAGNOSIS — R8290 Unspecified abnormal findings in urine: Secondary | ICD-10-CM | POA: Diagnosis not present

## 2024-01-23 LAB — URINALYSIS, ROUTINE W REFLEX MICROSCOPIC
Bilirubin Urine: NEGATIVE
Ketones, ur: NEGATIVE
Nitrite: NEGATIVE
Specific Gravity, Urine: 1.01 (ref 1.000–1.030)
Urine Glucose: NEGATIVE
Urobilinogen, UA: 0.2 (ref 0.0–1.0)
pH: 6.5 (ref 5.0–8.0)

## 2024-01-23 MED ORDER — CEPHALEXIN 500 MG PO CAPS: 500.0000 mg | ORAL_CAPSULE | Freq: Three times a day (TID) | ORAL | 0 refills | Status: DC

## 2024-01-24 ENCOUNTER — Telehealth: Payer: Self-pay | Admitting: *Deleted

## 2024-01-24 ENCOUNTER — Encounter: Payer: Self-pay | Admitting: Internal Medicine

## 2024-01-24 ENCOUNTER — Ambulatory Visit: Admitting: Emergency Medicine

## 2024-01-24 ENCOUNTER — Ambulatory Visit: Attending: Cardiology

## 2024-01-24 ENCOUNTER — Encounter: Payer: Self-pay | Admitting: Pharmacist

## 2024-01-24 DIAGNOSIS — I639 Cerebral infarction, unspecified: Secondary | ICD-10-CM

## 2024-01-24 DIAGNOSIS — Z8673 Personal history of transient ischemic attack (TIA), and cerebral infarction without residual deficits: Secondary | ICD-10-CM | POA: Insufficient documentation

## 2024-01-24 DIAGNOSIS — R8290 Unspecified abnormal findings in urine: Secondary | ICD-10-CM | POA: Insufficient documentation

## 2024-01-24 DIAGNOSIS — R531 Weakness: Secondary | ICD-10-CM | POA: Insufficient documentation

## 2024-01-24 DIAGNOSIS — S92902A Unspecified fracture of left foot, initial encounter for closed fracture: Secondary | ICD-10-CM | POA: Insufficient documentation

## 2024-01-24 DIAGNOSIS — I6340 Cerebral infarction due to embolism of unspecified cerebral artery: Secondary | ICD-10-CM

## 2024-01-24 LAB — URINE CULTURE

## 2024-01-24 NOTE — Progress Notes (Signed)
 Pharmacy Quality Measure Review  This patient is appearing on a report for being at risk of failing the adherence measure for hypertension (ACEi/ARB) medications this calendar year.   Medication: Losartan  Last fill date: 10/16/23 for 90 day supply  Med discontinued 12/08/23 on hospital discharge. Will fail metric this year.  Darrelyn Drum, PharmD, BCPS, CPP Clinical Pharmacist Practitioner [facility] [phone]

## 2024-01-24 NOTE — Progress Notes (Unsigned)
 [employer]ips 30 day cardiac event monitor mailed to [facility]. Philips monitor serial # [id number] applied to patient 01/24/24 and family / caregiver given tutorial about monitor.  Patient discharged to home from [facility].  Dr. Anner to read.

## 2024-01-24 NOTE — Assessment & Plan Note (Signed)
 Recent incidental at rehab for unclear reason per wife - now for ortho referral

## 2024-01-24 NOTE — Assessment & Plan Note (Signed)
Stable , continue aricept 

## 2024-01-24 NOTE — Assessment & Plan Note (Addendum)
 Patient was seen by neurology for the right MCA watershed distribution infarcts and several punctate L ACA and PCA infarcts concerning for cardioembolic source. Recommending DAPT x3 months, then aspirin  81 mg alone.  Crestor  10 mg.  Stable today

## 2024-01-24 NOTE — Assessment & Plan Note (Signed)
 Stable , cont inhaler prn

## 2024-01-24 NOTE — Assessment & Plan Note (Signed)
 Asympt but can't r/o uti  - for UA today

## 2024-01-24 NOTE — Telephone Encounter (Signed)
 Spoke to patient's wife Quenten Nawaz and daughter Aleen Mulders to let them know that since patient had not been seen by cardiology and the heart monitor results were not back, that we would need to cancel the appointment for today. They became upset because [facility] the facility that the patient is in would not put the heart monitor on and that the monitor was supposed to be put on at today's appointment with [facility]. They also stated that the monitor had no instructions with it but upon looking into the patient's chart [facility] had ordered the monitor and had in fact sent over the instructions to the monitor over to [facility]. I then told them to come on into the office and I would have one of the monitor nurse's to put it on or I would put it on myself. I then reached out to [place] with devices and that said that they will see the patient and out on the device. Patient and family agreed and will be coming into the office.

## 2024-01-24 NOTE — Assessment & Plan Note (Signed)
 Pt also for [facility] with RN, PT OT

## 2024-01-24 NOTE — Progress Notes (Deleted)
  Cardiology Office Note:    Date:  01/24/2024  ID:  RAVINDER LUKEHART, DOB 16-May-1927, MRN [id number] PCP: Norleen Lynwood ORN, MD  [facility] Providers Cardiologist:  None { Click to update primary MD,subspecialty MD or APP then REFRESH:1}    {Click to Open Review  :1}   Patient Profile:       Chief Complaint: *** History of Present Illness:  Stephen Wilkins is a 89 y.o. male with visit-pertinent history of ***  Discussed the use of AI scribe software for clinical note transcription with the patient, who gave verbal consent to proceed.  History of Present Illness     Review of systems:  Please see the history of present illness. All other systems are reviewed and otherwise negative. ***      Studies Reviewed:        ***  Risk Assessment/Calculations:   {Does this patient have ATRIAL FIBRILLATION?:[phone]} No BP recorded.  {Refresh Note OR Click here to enter BP  :1}***        Physical Exam:   VS:  There were no vitals taken for this visit.   Wt Readings from Last 3 Encounters:  01/22/24 184 lb (83.5 kg)  01/20/24 175 lb (79.4 kg)  01/03/24 185 lb (83.9 kg)    GEN: Well nourished, well developed in no acute distress NECK: No JVD; No carotid bruits CARDIAC: ***RRR, no murmurs, rubs, gallops RESPIRATORY:  Clear to auscultation without rales, wheezing or rhonchi  ABDOMEN: Soft, non-tender, non-distended EXTREMITIES:  No edema; No acute deformity ***      Assessment and Plan:    Assessment and Plan Assessment & Plan      {Are you ordering a CV Procedure (e.g. stress test, cath, DCCV, TEE, etc)?   Press F2        :[id number]}  Dispo:  No follow-ups on file.  Signed, Lum LITTIE Louis, NP

## 2024-01-28 ENCOUNTER — Other Ambulatory Visit: Payer: Self-pay | Admitting: Internal Medicine

## 2024-01-29 ENCOUNTER — Other Ambulatory Visit: Payer: Self-pay

## 2024-01-29 ENCOUNTER — Ambulatory Visit: Admitting: Surgery

## 2024-01-30 DIAGNOSIS — M79672 Pain in left foot: Secondary | ICD-10-CM | POA: Diagnosis not present

## 2024-01-30 DIAGNOSIS — S92415A Nondisplaced fracture of proximal phalanx of left great toe, initial encounter for closed fracture: Secondary | ICD-10-CM | POA: Diagnosis not present

## 2024-02-01 ENCOUNTER — Telehealth: Payer: Self-pay

## 2024-02-01 NOTE — Telephone Encounter (Signed)
 Copied from CRM #[id number]. Topic: Referral - Status >> Jan 31, 2024  3:02 PM Macario HERO wrote: Reason for CRM: Patient spouse called regarding a status on the referral for home health care. Requesting a call back with a status.

## 2024-02-02 ENCOUNTER — Telehealth: Payer: Self-pay | Admitting: Emergency Medicine

## 2024-02-02 NOTE — Telephone Encounter (Signed)
 Calling to report abnormal EKG report. Call transferred

## 2024-02-02 NOTE — Telephone Encounter (Signed)
   Cardiac Monitor Alert  Date of alert:  02/02/2024   Patient Name: Stephen Wilkins  DOB: [birth year]  MRN: [id number]   [facility] Cardiologist: None  [facility] EP:  None    Monitor Information: Cardiac Event Monitor [[employer]]  Reason:  a fib with RVR Ordering provider:  harding   Alert Atrial Fibrillation/Flutter This is the 1st alert for this rhythm.  The patient has no hx of Atrial Fibrillation/Flutter.  The patient is not currently on anticoagulation.  Next Cardiology Appointment   Date:  02/05/24  Provider:  peter jordan md  The patient was contacted today.  He is asymptomatic. Follow up appointment next week   Other: Monitor showed to Dr Kriste (DOD), due to his elevated creatine, age and history of falls, prior to starting anticoag, he would like the patient seen next week. Follow up scheduled   Adrien Conquest, RN  02/02/2024 1:30 PM

## 2024-02-03 ENCOUNTER — Inpatient Hospital Stay (HOSPITAL_COMMUNITY)
Admission: EM | Admit: 2024-02-03 | Discharge: 2024-02-20 | DRG: 309 | Disposition: A | Discharge status: Skilled Nursing Facility | Attending: Emergency Medicine | Admitting: Emergency Medicine

## 2024-02-03 ENCOUNTER — Emergency Department (HOSPITAL_COMMUNITY)

## 2024-02-03 ENCOUNTER — Other Ambulatory Visit: Payer: Self-pay

## 2024-02-03 ENCOUNTER — Encounter (HOSPITAL_COMMUNITY): Payer: Self-pay

## 2024-02-03 DIAGNOSIS — Z66 Do not resuscitate: Secondary | ICD-10-CM | POA: Diagnosis present

## 2024-02-03 DIAGNOSIS — D649 Anemia, unspecified: Secondary | ICD-10-CM | POA: Diagnosis present

## 2024-02-03 DIAGNOSIS — Z823 Family history of stroke: Secondary | ICD-10-CM

## 2024-02-03 DIAGNOSIS — Z856 Personal history of leukemia: Secondary | ICD-10-CM

## 2024-02-03 DIAGNOSIS — J449 Chronic obstructive pulmonary disease, unspecified: Secondary | ICD-10-CM | POA: Diagnosis present

## 2024-02-03 DIAGNOSIS — K219 Gastro-esophageal reflux disease without esophagitis: Secondary | ICD-10-CM | POA: Diagnosis present

## 2024-02-03 DIAGNOSIS — Z885 Allergy status to narcotic agent status: Secondary | ICD-10-CM

## 2024-02-03 DIAGNOSIS — F01518 Vascular dementia, unspecified severity, with other behavioral disturbance: Secondary | ICD-10-CM | POA: Diagnosis present

## 2024-02-03 DIAGNOSIS — I69344 Monoplegia of lower limb following cerebral infarction affecting left non-dominant side: Secondary | ICD-10-CM

## 2024-02-03 DIAGNOSIS — E785 Hyperlipidemia, unspecified: Secondary | ICD-10-CM | POA: Diagnosis present

## 2024-02-03 DIAGNOSIS — Z751 Person awaiting admission to adequate facility elsewhere: Secondary | ICD-10-CM

## 2024-02-03 DIAGNOSIS — Z825 Family history of asthma and other chronic lower respiratory diseases: Secondary | ICD-10-CM

## 2024-02-03 DIAGNOSIS — Z7902 Long term (current) use of antithrombotics/antiplatelets: Secondary | ICD-10-CM

## 2024-02-03 DIAGNOSIS — Z7189 Other specified counseling: Secondary | ICD-10-CM | POA: Diagnosis not present

## 2024-02-03 DIAGNOSIS — M1712 Unilateral primary osteoarthritis, left knee: Secondary | ICD-10-CM | POA: Diagnosis present

## 2024-02-03 DIAGNOSIS — R296 Repeated falls: Secondary | ICD-10-CM | POA: Diagnosis present

## 2024-02-03 DIAGNOSIS — J4489 Other specified chronic obstructive pulmonary disease: Secondary | ICD-10-CM | POA: Diagnosis present

## 2024-02-03 DIAGNOSIS — F0150 Vascular dementia without behavioral disturbance: Secondary | ICD-10-CM | POA: Diagnosis present

## 2024-02-03 DIAGNOSIS — F01B4 Vascular dementia, moderate, with anxiety: Secondary | ICD-10-CM | POA: Diagnosis not present

## 2024-02-03 DIAGNOSIS — Z79899 Other long term (current) drug therapy: Secondary | ICD-10-CM

## 2024-02-03 DIAGNOSIS — K5900 Constipation, unspecified: Secondary | ICD-10-CM | POA: Diagnosis present

## 2024-02-03 DIAGNOSIS — Z87891 Personal history of nicotine dependence: Secondary | ICD-10-CM

## 2024-02-03 DIAGNOSIS — I492 Junctional premature depolarization: Secondary | ICD-10-CM | POA: Diagnosis not present

## 2024-02-03 DIAGNOSIS — N1832 Chronic kidney disease, stage 3b: Secondary | ICD-10-CM | POA: Diagnosis present

## 2024-02-03 DIAGNOSIS — Z8249 Family history of ischemic heart disease and other diseases of the circulatory system: Secondary | ICD-10-CM

## 2024-02-03 DIAGNOSIS — Z888 Allergy status to other drugs, medicaments and biological substances status: Secondary | ICD-10-CM

## 2024-02-03 DIAGNOSIS — Z789 Other specified health status: Secondary | ICD-10-CM | POA: Diagnosis not present

## 2024-02-03 DIAGNOSIS — Z515 Encounter for palliative care: Secondary | ICD-10-CM

## 2024-02-03 DIAGNOSIS — F0390 Unspecified dementia without behavioral disturbance: Secondary | ICD-10-CM | POA: Diagnosis present

## 2024-02-03 DIAGNOSIS — E039 Hypothyroidism, unspecified: Secondary | ICD-10-CM | POA: Diagnosis present

## 2024-02-03 DIAGNOSIS — I129 Hypertensive chronic kidney disease with stage 1 through stage 4 chronic kidney disease, or unspecified chronic kidney disease: Secondary | ICD-10-CM | POA: Diagnosis present

## 2024-02-03 DIAGNOSIS — Z7982 Long term (current) use of aspirin: Secondary | ICD-10-CM

## 2024-02-03 DIAGNOSIS — J411 Mucopurulent chronic bronchitis: Secondary | ICD-10-CM | POA: Diagnosis not present

## 2024-02-03 DIAGNOSIS — N400 Enlarged prostate without lower urinary tract symptoms: Secondary | ICD-10-CM | POA: Diagnosis present

## 2024-02-03 DIAGNOSIS — R5381 Other malaise: Secondary | ICD-10-CM | POA: Diagnosis present

## 2024-02-03 DIAGNOSIS — R55 Syncope and collapse: Secondary | ICD-10-CM | POA: Diagnosis not present

## 2024-02-03 DIAGNOSIS — I700 Atherosclerosis of aorta: Secondary | ICD-10-CM | POA: Diagnosis not present

## 2024-02-03 DIAGNOSIS — Z801 Family history of malignant neoplasm of trachea, bronchus and lung: Secondary | ICD-10-CM

## 2024-02-03 LAB — CBC
HCT: 27.4 % — ABNORMAL LOW (ref 39.0–52.0)
Hemoglobin: 8.7 g/dL — ABNORMAL LOW (ref 13.0–17.0)
MCH: 31.3 pg (ref 26.0–34.0)
MCHC: 31.8 g/dL (ref 30.0–36.0)
MCV: 98.6 fL (ref 80.0–100.0)
Platelets: 271 K/uL (ref 150–400)
RBC: 2.78 MIL/uL — ABNORMAL LOW (ref 4.22–5.81)
RDW: 12.4 % (ref 11.5–15.5)
WBC: 8.8 K/uL (ref 4.0–10.5)
nRBC: 0 % (ref 0.0–0.2)

## 2024-02-03 LAB — COMPREHENSIVE METABOLIC PANEL WITH GFR
ALT: 8 U/L (ref 0–44)
AST: 15 U/L (ref 15–41)
Albumin: 2.1 g/dL — ABNORMAL LOW (ref 3.5–5.0)
Alkaline Phosphatase: 44 U/L (ref 38–126)
Anion gap: 10 (ref 5–15)
BUN: 23 mg/dL (ref 8–23)
CO2: 20 mmol/L — ABNORMAL LOW (ref 22–32)
Calcium: 6.9 mg/dL — ABNORMAL LOW (ref 8.9–10.3)
Chloride: 109 mmol/L (ref 98–111)
Creatinine, Ser: 1.91 mg/dL — ABNORMAL HIGH (ref 0.61–1.24)
GFR, Estimated: 32 mL/min — ABNORMAL LOW (ref >60)
Glucose, Bld: 115 mg/dL — ABNORMAL HIGH (ref 70–99)
Potassium: 3.8 mmol/L (ref 3.5–5.1)
Sodium: 139 mmol/L (ref 135–145)
Total Bilirubin: 0.7 mg/dL (ref 0.0–1.2)
Total Protein: 4.8 g/dL — ABNORMAL LOW (ref 6.5–8.1)

## 2024-02-03 LAB — CBG MONITORING, ED: Glucose-Capillary: 132 mg/dL — ABNORMAL HIGH (ref 70–99)

## 2024-02-03 NOTE — ED Triage Notes (Signed)
 Patient is coming from home. BLS crew was called out for a lift assist. When assisting patient up, patient went unconscious and started vomiting around 2206, 89% RA and 60 HR. When patient regained consciousness at 2209, cbg 166, 95% RA. 80 HR, 170/80 BP. Patient received 400ml of LR via EMS. No complaints of pain, N/V/D, or dizziness. EMS VS 100/70 BP 98% RA 18 RR 80 HR

## 2024-02-04 DIAGNOSIS — R55 Syncope and collapse: Secondary | ICD-10-CM | POA: Diagnosis present

## 2024-02-04 LAB — CBC
HCT: 29.6 % — ABNORMAL LOW (ref 39.0–52.0)
Hemoglobin: 9.3 g/dL — ABNORMAL LOW (ref 13.0–17.0)
MCH: 30.8 pg (ref 26.0–34.0)
MCHC: 31.4 g/dL (ref 30.0–36.0)
MCV: 98 fL (ref 80.0–100.0)
Platelets: 274 K/uL (ref 150–400)
RBC: 3.02 MIL/uL — ABNORMAL LOW (ref 4.22–5.81)
RDW: 12.7 % (ref 11.5–15.5)
WBC: 9.4 K/uL (ref 4.0–10.5)
nRBC: 0 % (ref 0.0–0.2)

## 2024-02-04 LAB — MAGNESIUM
Magnesium: 1.6 mg/dL — ABNORMAL LOW (ref 1.7–2.4)
Magnesium: 2 mg/dL (ref 1.7–2.4)

## 2024-02-04 LAB — TROPONIN I (HIGH SENSITIVITY)
Troponin I (High Sensitivity): 7 ng/L (ref <18)
Troponin I (High Sensitivity): 9 ng/L (ref <18)

## 2024-02-04 LAB — BASIC METABOLIC PANEL WITH GFR
Anion gap: 11 (ref 5–15)
BUN: 26 mg/dL — ABNORMAL HIGH (ref 8–23)
CO2: 25 mmol/L (ref 22–32)
Calcium: 8.6 mg/dL — ABNORMAL LOW (ref 8.9–10.3)
Chloride: 102 mmol/L (ref 98–111)
Creatinine, Ser: 2.35 mg/dL — ABNORMAL HIGH (ref 0.61–1.24)
GFR, Estimated: 25 mL/min — ABNORMAL LOW (ref >60)
Glucose, Bld: 124 mg/dL — ABNORMAL HIGH (ref 70–99)
Potassium: 5.3 mmol/L — ABNORMAL HIGH (ref 3.5–5.1)
Sodium: 138 mmol/L (ref 135–145)

## 2024-02-04 LAB — PROTIME-INR
INR: 1.1 (ref 0.8–1.2)
Prothrombin Time: 14.9 s (ref 11.4–15.2)

## 2024-02-04 LAB — BRAIN NATRIURETIC PEPTIDE: B Natriuretic Peptide: 71.7 pg/mL (ref 0.0–100.0)

## 2024-02-04 MED ORDER — ASPIRIN 81 MG PO TBEC
81.0000 mg | DELAYED_RELEASE_TABLET | Freq: Every day | ORAL | Status: DC
  Administered 2024-02-04 – 2024-02-20 (×17): 81 mg via ORAL
  Filled 2024-02-04 (×17): qty 1

## 2024-02-04 MED ORDER — ROSUVASTATIN CALCIUM 5 MG PO TABS
10.0000 mg | ORAL_TABLET | Freq: Every day | ORAL | Status: DC
  Administered 2024-02-04 – 2024-02-20 (×17): 10 mg via ORAL
  Filled 2024-02-04 (×17): qty 2

## 2024-02-04 MED ORDER — DONEPEZIL HCL 10 MG PO TABS
5.0000 mg | ORAL_TABLET | Freq: Every day | ORAL | Status: DC
  Administered 2024-02-04 – 2024-02-20 (×17): 5 mg via ORAL
  Filled 2024-02-04 (×17): qty 1

## 2024-02-04 MED ORDER — ACETAMINOPHEN 325 MG PO TABS
650.0000 mg | ORAL_TABLET | Freq: Four times a day (QID) | ORAL | Status: DC | PRN
  Administered 2024-02-09 – 2024-02-12 (×2): 650 mg via ORAL
  Filled 2024-02-04 (×4): qty 2

## 2024-02-04 MED ORDER — FESOTERODINE FUMARATE ER 4 MG PO TB24
4.0000 mg | ORAL_TABLET | Freq: Every day | ORAL | Status: DC
Start: 2024-02-04 — End: 2024-02-20
  Administered 2024-02-04 – 2024-02-20 (×17): 4 mg via ORAL
  Filled 2024-02-04 (×17): qty 1

## 2024-02-04 MED ORDER — FINASTERIDE 5 MG PO TABS
5.0000 mg | ORAL_TABLET | Freq: Every day | ORAL | Status: DC
  Administered 2024-02-04 – 2024-02-20 (×17): 5 mg via ORAL
  Filled 2024-02-04 (×17): qty 1

## 2024-02-04 MED ORDER — ALBUTEROL SULFATE (2.5 MG/3ML) 0.083% IN NEBU: 2.5000 mg | INHALATION_SOLUTION | RESPIRATORY_TRACT | Status: DC | PRN

## 2024-02-04 MED ORDER — ACETAMINOPHEN 650 MG RE SUPP: 650.0000 mg | Freq: Four times a day (QID) | RECTAL | Status: DC | PRN

## 2024-02-04 MED ORDER — MAGNESIUM SULFATE IN D5W 1-5 GM/100ML-% IV SOLN
1.0000 g | Freq: Once | INTRAVENOUS | Status: AC
  Administered 2024-02-04: 1 g via INTRAVENOUS
  Filled 2024-02-04: qty 100

## 2024-02-04 MED ORDER — CLOPIDOGREL BISULFATE 75 MG PO TABS
75.0000 mg | ORAL_TABLET | Freq: Every day | ORAL | Status: DC
  Administered 2024-02-04 – 2024-02-20 (×17): 75 mg via ORAL
  Filled 2024-02-04 (×17): qty 1

## 2024-02-04 MED ORDER — PANTOPRAZOLE SODIUM 40 MG PO TBEC
40.0000 mg | DELAYED_RELEASE_TABLET | Freq: Every day | ORAL | Status: DC
  Administered 2024-02-04 – 2024-02-20 (×17): 40 mg via ORAL
  Filled 2024-02-04 (×17): qty 1

## 2024-02-04 MED ORDER — BUDESON-GLYCOPYRROL-FORMOTEROL 160-9-4.8 MCG/ACT IN AERO
2.0000 | INHALATION_SPRAY | Freq: Two times a day (BID) | RESPIRATORY_TRACT | Status: DC
  Administered 2024-02-04 – 2024-02-19 (×29): 2 via RESPIRATORY_TRACT
  Filled 2024-02-04 (×2): qty 5.9

## 2024-02-04 MED ORDER — TAMSULOSIN HCL 0.4 MG PO CAPS
0.4000 mg | ORAL_CAPSULE | Freq: Every day | ORAL | Status: DC
  Administered 2024-02-04 – 2024-02-19 (×16): 0.4 mg via ORAL
  Filled 2024-02-04 (×16): qty 1

## 2024-02-04 MED ORDER — SODIUM CHLORIDE 0.9 % IV SOLN: INTRAVENOUS | Status: DC

## 2024-02-04 MED ORDER — HEPARIN SODIUM (PORCINE) 5000 UNIT/ML IJ SOLN
5000.0000 [IU] | Freq: Three times a day (TID) | INTRAMUSCULAR | Status: DC
  Administered 2024-02-04 – 2024-02-20 (×48): 5000 [IU] via SUBCUTANEOUS
  Filled 2024-02-04 (×48): qty 1

## 2024-02-04 MED ORDER — SODIUM CHLORIDE 0.9 % IV SOLN
INTRAVENOUS | Status: DC
  Administered 2024-02-04: 40 mL/h via INTRAVENOUS

## 2024-02-04 NOTE — Progress Notes (Signed)
  PROGRESS NOTE  Patient admitted earlier this morning. See H&P.   Patient admitted after having a syncopal episode at home.  He had been recently hospitalized for CVA and was discharged to SNF, then home.  He has dementia and is a poor historian.  He has no complaints on my examination today.  He is in normal sinus rhythm without bradycardia this morning.  Cardiology following, continue to monitor on telemetry. Started IV fluids due to elevation in creatinine.     Status is: Observation The patient will require care spanning > 2 midnights and should be moved to inpatient because: monitor on telemetry    Delon Hoe, DO [facility] 02/04/2024, 10:01 AM  Available via Epic secure chat 7am-7pm After these hours, please refer to coverage provider listed on [email or web address]

## 2024-02-04 NOTE — ED Provider Notes (Signed)
 [facility] DEPT [facility] Emergency Department Provider Note MRN:  [id number]  Arrival date & time: 02/04/24     Chief Complaint   Loss of Consciousness   History of Present Illness   Stephen Wilkins is a 89 y.o. year-old male with a history of COPD, dementia presenting to the ED with chief complaint of syncope.  Patient recently returned home from rehab.  Was unsteady on his feet and then slipped and slumped off of the toilet.  Wife had to call neighbors to help him get up.  When he got up he became pale and then started vomiting and then passed out.  After being laid back down he quickly woke up and returned to baseline.  Review of Systems  A thorough review of systems was obtained and all systems are negative except as noted in the HPI and PMH.   Patient's Health History    Past Medical History:  Diagnosis Date   ABNORMAL ELECTROCARDIOGRAM 06/21/2007   ALLERGIC RHINITIS 03/23/2007   Anxiety state, unspecified 09/06/2013   ASTHMATIC BRONCHITIS, ACUTE 04/27/2007   BENIGN PROSTATIC HYPERTROPHY 03/23/2007   BRONCHITIS NOT SPECIFIED AS ACUTE OR CHRONIC 04/21/2007   BURSITIS, RIGHT HIP 07/31/2008   CKD (chronic kidney disease)    COPD (chronic obstructive pulmonary disease) (HCC) 04/19/2016   Cough 01/29/2009   Dementia (HCC) 09/01/2010   ERECTILE DYSFUNCTION 03/23/2007   Esophageal stricture    Falls frequently    FREQUENCY, URINARY 04/26/2010   GERD 03/23/2007   HIATAL HERNIA    HYPERLIPIDEMIA 03/23/2007   HYPERTENSION 03/23/2007   MILD COGNITIVE IMPAIRMENT SO STATED 05/19/2010   NEPHROLITHIASIS, HX OF 03/23/2007   Nocturnal confusion 12/04/2023   Daughter states sun downs at night   PSA, INCREASED 06/27/2008   REACTIVE AIRWAY DISEASE 01/14/2010   SCHATZKI'S RING    SCIATICA, RIGHT 04/28/2008   Unspecified hypothyroidism 09/06/2013   Vascular dementia [facility])     Past Surgical History:  Procedure Laterality Date   COLONOSCOPY WITH PROPOFOL  N/A 11/12/2022    Procedure: COLONOSCOPY WITH PROPOFOL ;  Surgeon: Wilhelmenia Aloha Raddle., MD;  Location: [facility];  Service: Gastroenterology;  Laterality: N/A;   ERCP N/A 12/28/2018   Procedure: ENDOSCOPIC RETROGRADE CHOLANGIOPANCREATOGRAPHY (ERCP);  Surgeon: Aneita Gwendlyn DASEN, MD;  Location: [facility];  Service: Endoscopy;  Laterality: N/A;   HEMOSTASIS CLIP PLACEMENT  11/12/2022   Procedure: HEMOSTASIS CLIP PLACEMENT;  Surgeon: Wilhelmenia Aloha Raddle., MD;  Location: [facility];  Service: Gastroenterology;;   POLYPECTOMY  11/12/2022   Procedure: POLYPECTOMY;  Surgeon: Wilhelmenia Aloha Raddle., MD;  Location: [facility];  Service: Gastroenterology;;   REMOVAL OF STONES  12/28/2018   Procedure: REMOVAL OF STONES;  Surgeon: Aneita Gwendlyn DASEN, MD;  Location: [facility];  Service: Endoscopy;;  balloon sweep   SPHINCTEROTOMY  12/28/2018   Procedure: SPHINCTEROTOMY;  Surgeon: Aneita Gwendlyn DASEN, MD;  Location: [facility];  Service: Endoscopy;;   TONSILLECTOMY      Family History  Problem Relation Age of Onset   Stroke Mother    Asthma Sister    Lung cancer Brother        smoked   Emphysema Brother        smoked   Stroke Maternal Uncle    Cancer Other        lung cancer   Hypertension Other     Social History   Socioeconomic History   Marital status: Married    Spouse name: Rockie   Number of children: 2   Years of education:  Not on file   Highest education level: Not on file  Occupational History   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    Employer: RETIRED  Tobacco Use   Smoking status: Former    Types: Cigarettes   Smokeless tobacco: Never   Tobacco comments:    tried smoking in HS  Vaping Use   Vaping status: Never Used  Substance and Sexual Activity   Alcohol use: No   Drug use: No   Sexual activity: Never  Other Topics Concern   Not on file  Social History Narrative   Lives with wife/2025   Retired    Social Drivers of Manufacturing Engineer Strain: Low Risk  (10/02/2023)   Overall Financial Resource Strain (CARDIA)    Difficulty of Paying Living Expenses: Not hard at all  Food Insecurity: No Food Insecurity (12/04/2023)   Hunger Vital Sign    Worried About Running Out of Food in the Last Year: Never true    Ran Out of Food in the Last Year: Never true  Transportation Needs: No Transportation Needs (12/04/2023)   PRAPARE - Administrator, Civil Service (Medical): No    Lack of Transportation (Non-Medical): No  Physical Activity: Inactive (10/02/2023)   Exercise Vital Sign    Days of Exercise per Week: 0 days    Minutes of Exercise per Session: 0 min  Stress: No Stress Concern Present (10/02/2023)   [employer] of Occupational Health - Occupational Stress Questionnaire    Feeling of Stress: Only a little  Social Connections: Moderately Isolated (12/04/2023)   Social Connection and Isolation Panel    Frequency of Communication with Friends and Family: More than three times a week    Frequency of Social Gatherings with Friends and Family: Twice a week    Attends Religious Services: Never    Database Administrator or Organizations: No    Attends Banker Meetings: Never    Marital Status: Married  Catering Manager Violence: Not At Risk (12/04/2023)   Humiliation, Afraid, Rape, and Kick questionnaire    Fear of Current or Ex-Partner: No    Emotionally Abused: No    Physically Abused: No    Sexually Abused: No     Physical Exam   Vitals:   02/04/24 0230 02/04/24 0245  BP: 123/72 124/62  Pulse: 73 64  Resp: 16 (!) 22  SpO2: 100% 100%    CONSTITUTIONAL: Chronically ill-appearing, NAD NEURO/PSYCH:  Alert and oriented x 3, subtle left leg weakness (chronic) EYES:  eyes equal and reactive ENT/NECK:  no LAD, no JVD CARDIO: Regular rate, well-perfused, normal S1 and S2 PULM:  CTAB no wheezing or rhonchi GI/GU:  non-distended, non-tender MSK/SPINE:  No gross deformities, no  edema SKIN:  no rash, atraumatic   *Additional and/or pertinent findings included in MDM below  Diagnostic and Interventional Summary    EKG Interpretation Date/Time:  Saturday February 03 2024 23:09:57 EST Ventricular Rate:  75 PR Interval:  181 QRS Duration:  91 QT Interval:  404 QTC Calculation: 452 R Axis:   35  Text Interpretation: Sinus rhythm Confirmed by Theadore Sharper [phone]) on 02/04/2024 2:47:36 AM       Labs Reviewed  COMPREHENSIVE METABOLIC PANEL WITH GFR - Abnormal; Notable for the following components:      Result Value   CO2 20 (*)    Glucose, Bld 115 (*)    Creatinine, Ser 1.91 (*)  Calcium  6.9 (*)    Total Protein 4.8 (*)    Albumin  2.1 (*)    GFR, Estimated 32 (*)    All other components within normal limits  CBC - Abnormal; Notable for the following components:   RBC 2.78 (*)    Hemoglobin 8.7 (*)    HCT 27.4 (*)    All other components within normal limits  MAGNESIUM  - Abnormal; Notable for the following components:   Magnesium  1.6 (*)    All other components within normal limits  CBG MONITORING, ED - Abnormal; Notable for the following components:   Glucose-Capillary 132 (*)    All other components within normal limits  PROTIME-INR  BRAIN NATRIURETIC PEPTIDE  URINALYSIS, ROUTINE W REFLEX MICROSCOPIC  TROPONIN I (HIGH SENSITIVITY)  TROPONIN I (HIGH SENSITIVITY)    DG Chest Port 1 View  Final Result      Medications - No data to display   Procedures  /  Critical Care Procedures  ED Course and Medical Decision Making  Initial Impression and Ddx Syncope at home.  Patient is nontoxic at this time, fully conversant, reassuring vital signs.  Subtle deficits from prior stroke on the left but moving all extremities well, no new focal deficits per family.  Denies any pain or injuries from falling, no head trauma.  Feels a lot better, no longer nauseated.  Currently has a 30-day cardiac monitor on his chest.  Differential diagnosis includes  arrhythmia, orthostatic hypotension, dehydration, electrolyte disturbance, underlying infection.  Past medical/surgical history that increases complexity of ED encounter: Stroke, vascular dementia  Interpretation of Diagnostics I personally reviewed the EKG and my interpretation is as follows: Sinus rhythm, no ischemic findings  No significant blood count or electrolyte disturbance.  Patient Reassessment and Ultimate Disposition/Management     Given patient's comorbidities will request hospitalist admission for high risk syncope.  Patient management required discussion with the following services or consulting groups:  Hospitalist Service  Complexity of Problems Addressed Acute illness or injury that poses threat of life of bodily function  Additional Data Reviewed and Analyzed Further history obtained from: Further history from spouse/family member  Additional Factors Impacting ED Encounter Risk Consideration of hospitalization  Ozell HERO. Theadore, MD [facility] [email or web address]  Final Clinical Impressions(s) / ED Diagnoses     ICD-10-CM   1. Syncope, unspecified syncope type  R55       ED Discharge Orders     None        Discharge Instructions Discussed with and Provided to Patient:   Discharge Instructions   None      Theadore Ozell HERO, MD 02/04/24 [phone]

## 2024-02-04 NOTE — H&P (Signed)
 History and Physical    Stephen Wilkins FMW:[id number] DOB: December 18, 1927 DOA: 02/03/2024  PCP: Norleen Lynwood ORN, MD  Patient coming from: Home  Chief Complaint: Syncope  HPI: Stephen Wilkins is a 89 y.o. male with medical history significant of dementia, COPD, CVA, BPH, esophageal stricture, hypertension, hyperlipidemia, diverticulosis, prediabetes, CKD stage IIIb. He was admitted to the hospital 9/1-12/08/2023 for left leg weakness secondary to acute ischemic CVA, syncope, and AKI. Losartan  was stopped at discharge. Patient presents to the ED today from home via EMS for evaluation of syncope. Patient is oriented to self only and not able to give a meaningful history.  He denies dizziness, chest pain, or shortness of breath.  I spoke to his wife over the phone who informed me that patient returned from rehab 2 weeks ago.  Tonight caregiver was helping the patient walk to the bathroom when he had an episode of syncope.  Wife states patient became pale and started vomiting and then passed out.  States since after his stroke, he has continued to have weakness of his left leg.  Wife states patient was recently finished antibiotics for UTI.  Patient currently has a 30-day cardiac monitor in place.  ED course: Vital signs on EMS arrival: Blood pressure 100/70, SpO2 98% on room air, respiratory rate 18, heart rate 80. CBG 166. Patient received 400 mL IV fluids prior to arrival. EKG showing normal sinus rhythm and no acute ischemic changes. Labs showing no leukocytosis, hemoglobin 8.7 (previously 9.2 on labs 2 weeks ago and was in the 9-10 range on labs 2 months ago), bicarb 20, glucose 150, creatinine 1.9 (improved), calcium  6.9, albumin  2.1, normal LFTs, UA pending, magnesium  1.6, BNP 71, troponin negative x 2. Chest x-ray showing mild hyperinflation and no acute cardiopulmonary process.  TRH called to admit.  Review of Systems:  Review of Systems  All other systems reviewed and are negative.   Past Medical  History:  Diagnosis Date   ABNORMAL ELECTROCARDIOGRAM 06/21/2007   ALLERGIC RHINITIS 03/23/2007   Anxiety state, unspecified 09/06/2013   ASTHMATIC BRONCHITIS, ACUTE 04/27/2007   BENIGN PROSTATIC HYPERTROPHY 03/23/2007   BRONCHITIS NOT SPECIFIED AS ACUTE OR CHRONIC 04/21/2007   BURSITIS, RIGHT HIP 07/31/2008   CKD (chronic kidney disease)    COPD (chronic obstructive pulmonary disease) (HCC) 04/19/2016   Cough 01/29/2009   Dementia (HCC) 09/01/2010   ERECTILE DYSFUNCTION 03/23/2007   Esophageal stricture    Falls frequently    FREQUENCY, URINARY 04/26/2010   GERD 03/23/2007   HIATAL HERNIA    HYPERLIPIDEMIA 03/23/2007   HYPERTENSION 03/23/2007   MILD COGNITIVE IMPAIRMENT SO STATED 05/19/2010   NEPHROLITHIASIS, HX OF 03/23/2007   Nocturnal confusion 12/04/2023   Daughter states sun downs at night   PSA, INCREASED 06/27/2008   REACTIVE AIRWAY DISEASE 01/14/2010   SCHATZKI'S RING    SCIATICA, RIGHT 04/28/2008   Unspecified hypothyroidism 09/06/2013   Vascular dementia [facility])     Past Surgical History:  Procedure Laterality Date   COLONOSCOPY WITH PROPOFOL  N/A 11/12/2022   Procedure: COLONOSCOPY WITH PROPOFOL ;  Surgeon: Wilhelmenia Aloha Raddle., MD;  Location: [facility];  Service: Gastroenterology;  Laterality: N/A;   ERCP N/A 12/28/2018   Procedure: ENDOSCOPIC RETROGRADE CHOLANGIOPANCREATOGRAPHY (ERCP);  Surgeon: Aneita Gwendlyn DASEN, MD;  Location: [facility];  Service: Endoscopy;  Laterality: N/A;   HEMOSTASIS CLIP PLACEMENT  11/12/2022   Procedure: HEMOSTASIS CLIP PLACEMENT;  Surgeon: Wilhelmenia Aloha Raddle., MD;  Location: [facility];  Service: Gastroenterology;;   POLYPECTOMY  11/12/2022  Procedure: POLYPECTOMY;  Surgeon: Wilhelmenia Aloha Raddle., MD;  Location: [facility];  Service: Gastroenterology;;   REMOVAL OF STONES  12/28/2018   Procedure: REMOVAL OF STONES;  Surgeon: Aneita Gwendlyn DASEN, MD;  Location: [facility];  Service: Endoscopy;;  balloon sweep    SPHINCTEROTOMY  12/28/2018   Procedure: ANNETT;  Surgeon: Aneita Gwendlyn DASEN, MD;  Location: [facility];  Service: Endoscopy;;   TONSILLECTOMY       reports that he has quit smoking. His smoking use included cigarettes. He has never used smokeless tobacco. He reports that he does not drink alcohol and does not use drugs.  Allergies  Allergen Reactions   Atorvastatin  Other (See Comments)    REACTION: myalgia   Doxazosin Mesylate Other (See Comments)    REACTION: dizziness   Hydrocodone  Bit-Homatrop Mbr Other (See Comments)    anxiety   Hydrocodone  Anxiety    Family History  Problem Relation Age of Onset   Stroke Mother    Asthma Sister    Lung cancer Brother        smoked   Emphysema Brother        smoked   Stroke Maternal Uncle    Cancer Other        lung cancer   Hypertension Other     Prior to Admission medications   Medication Sig Start Date End Date Taking? Authorizing Provider  albuterol  (PROVENTIL ) (2.5 MG/3ML) 0.083% nebulizer solution Take 3 mLs (2.5 mg total) by nebulization every 4 (four) hours as needed for wheezing or shortness of breath. 11/28/23   [name], FNP  aspirin  EC 81 MG tablet Take 1 tablet (81 mg total) by mouth daily. Swallow whole. 12/08/23 03/07/24  [name] Ranks, MD  cephALEXin  (KEFLEX ) 500 MG capsule Take 1 capsule (500 mg total) by mouth 3 (three) times daily. 01/23/24   Norleen Lynwood ORN, MD  clopidogrel  (PLAVIX ) 75 MG tablet Take 1 tablet (75 mg total) by mouth daily. 12/08/23 03/05/24  [name] Ranks, MD  donepezil  (ARICEPT ) 5 MG tablet Take 5 mg by mouth daily.    [provider]  finasteride  (PROSCAR ) 5 MG tablet Take 5 mg by mouth daily. 11/17/23   [provider]  Fluticasone -Umeclidin-Vilant (TRELEGY ELLIPTA ) 100-62.5-25 MCG/ACT AEPB Inhale 1 puff into the lungs daily. Patient taking differently: Inhale 1 puff into the lungs daily as needed (for wheezing and shortness of breath). 06/01/23   Norleen Lynwood ORN, MD   pantoprazole  (PROTONIX ) 40 MG tablet Take 1 tablet (40 mg total) by mouth daily. 06/01/23   Norleen Lynwood ORN, MD  polyethylene glycol powder (MIRALAX ) powder Take 1/2 capful by mouth once daily 01/28/15   Norleen Lynwood ORN, MD  rosuvastatin  (CRESTOR ) 10 MG tablet Take 1 tablet (10 mg total) by mouth daily. 06/01/23   Norleen Lynwood ORN, MD  solifenacin  (VESICARE ) 5 MG tablet Take 1 tablet (5 mg total) by mouth daily. 06/01/23   Norleen Lynwood ORN, MD  tamsulosin  (FLOMAX ) 0.4 MG CAPS capsule Take 1 capsule by mouth at bedtime 01/29/24   Norleen Lynwood ORN, MD    Physical Exam: Vitals:   02/04/24 0215 02/04/24 0230 02/04/24 0245 02/04/24 0357  BP: 135/72 123/72 124/62   Pulse: 89 73 64   Resp: (!) 26 16 (!) 22   Temp:    97.7 F (36.5 C)  TempSrc:    Axillary  SpO2: 100% 100% 100%     Physical Exam Vitals reviewed.  Constitutional:      General: He is not  in acute distress. HENT:     Head: Normocephalic and atraumatic.  Eyes:     Extraocular Movements: Extraocular movements intact.  Cardiovascular:     Rate and Rhythm: Normal rate and regular rhythm.     Heart sounds: Murmur heard.  Pulmonary:     Effort: Pulmonary effort is normal. No respiratory distress.     Breath sounds: Normal breath sounds.  Abdominal:     General: Bowel sounds are normal.     Palpations: Abdomen is soft.     Tenderness: There is no abdominal tenderness. There is no guarding.  Musculoskeletal:     Cervical back: Normal range of motion.     Right lower leg: No edema.     Left lower leg: No edema.  Skin:    General: Skin is warm and dry.  Neurological:     Mental Status: He is alert.     Motor: Weakness present.     Comments: Examination limited due to patient having dementia.  He is following commands only intermittently.  He is moving both of his upper extremities spontaneously.  He is able to move his right lower extremity but noted to have weakness of his left lower extremity.     Labs on Admission: I have  personally reviewed following labs and imaging studies  CBC: Recent Labs  Lab 02/03/24 2311  WBC 8.8  HGB 8.7*  HCT 27.4*  MCV 98.6  PLT 271   Basic Metabolic Panel: Recent Labs  Lab 02/03/24 2311  NA 139  K 3.8  CL 109  CO2 20*  GLUCOSE 115*  BUN 23  CREATININE 1.91*  CALCIUM  6.9*  MG 1.6*   GFR: Estimated Creatinine Clearance: 26.5 mL/min (A) (by C-G formula based on SCr of 1.91 mg/dL (H)). Liver Function Tests: Recent Labs  Lab 02/03/24 2311  AST 15  ALT 8  ALKPHOS 44  BILITOT 0.7  PROT 4.8*  ALBUMIN  2.1*   No results for input(s): LIPASE, AMYLASE in the last 168 hours. No results for input(s): AMMONIA in the last 168 hours. Coagulation Profile: Recent Labs  Lab 02/03/24 2343  INR 1.1   Cardiac Enzymes: No results for input(s): CKTOTAL, CKMB, CKMBINDEX, TROPONINI in the last 168 hours. BNP (last 3 results) No results for input(s): PROBNP in the last 8760 hours. HbA1C: No results for input(s): HGBA1C in the last 72 hours. CBG: Recent Labs  Lab 02/03/24 2313  GLUCAP 132*   Lipid Profile: No results for input(s): CHOL, HDL, LDLCALC, TRIG, CHOLHDL, LDLDIRECT in the last 72 hours. Thyroid  Function Tests: No results for input(s): TSH, T4TOTAL, FREET4, T3FREE, THYROIDAB in the last 72 hours. Anemia Panel: No results for input(s): VITAMINB12, FOLATE, FERRITIN, TIBC, IRON, RETICCTPCT in the last 72 hours. Urine analysis:    Component Value Date/Time   COLORURINE YELLOW 01/23/2024 1019   APPEARANCEUR Turbid (A) 01/23/2024 1019   LABSPEC 1.010 01/23/2024 1019   PHURINE 6.5 01/23/2024 1019   GLUCOSEU NEGATIVE 01/23/2024 1019   HGBUR SMALL (A) 01/23/2024 1019   BILIRUBINUR NEGATIVE 01/23/2024 1019   KETONESUR NEGATIVE 01/23/2024 1019   PROTEINUR NEGATIVE 12/04/2023 1256   UROBILINOGEN 0.2 01/23/2024 1019   NITRITE NEGATIVE 01/23/2024 1019   LEUKOCYTESUR LARGE (A) 01/23/2024 1019    Radiological  Exams on Admission: DG Chest Port 1 View Result Date: 02/03/2024 EXAM: 1 VIEW(S) XRAY OF THE CHEST 02/03/2024 11:53:00 PM COMPARISON: 01/20/2024 CLINICAL HISTORY: syncope FINDINGS: LUNGS AND PLEURA: Mild hyperinflation. No focal pulmonary opacity. No pulmonary edema. No  pleural effusion. No pneumothorax. HEART AND MEDIASTINUM: Aortic atherosclerosis. No acute abnormality of the cardiac and mediastinal silhouettes. BONES AND SOFT TISSUES: No acute osseous abnormality. IMPRESSION: 1. No acute cardiopulmonary process. 2. Mild hyperinflation. Electronically signed by: Franky Crease MD 02/03/2024 11:57 PM EDT RP Workstation: [id number]    Assessment and Plan  Syncope Wife states patient's caregiver was helping him walk to the bathroom tonight when he became pale, vomited, and then lost consciousness.  No head injury or seizure-like activity reported.  Patient has left lower extremity weakness on exam which according to his wife is ongoing since after his stroke 2 months ago and not new.  EKG showing normal sinus rhythm and no acute ischemic changes.  Troponin negative x 2.  PE less likely given no tachycardia or hypoxia.  Not hypotensive or hypoglycemic.  Echo done 12/05/2023 showing EF 60 to 65%, grade 1 diastolic dysfunction, mild mitral stenosis, and no evidence of aortic stenosis.  Patient has a 30-day cardiac monitor in place.  Informed by cardiologist that around 10 PM patient noted to have 120 seconds of junctional rhythm with heart rate in the 30s which would likely explain his syncopal event.  He is not on any AV nodal blocking agents.  Daytime cardiology team will see the patient in consultation.  Continue cardiac monitoring.  Normocytic anemia Hemoglobin 8.7 (previously 9.2 on labs 2 weeks ago and was in the 9-10 range on labs 2 months ago).  No GI bleed symptoms reported by the patient's wife.  Continue to monitor H&H.  Mild hypomagnesemia Replace magnesium  and monitor labs.  Dementia Continue  Aricept .  Delirium precautions.  COPD Stable, no signs of acute exacerbation.  Continue home inhalers.  History of CVA with residual left lower extremity weakness Continue aspirin , Plavix , and Crestor .  BPH Continue home meds.  CKD stage IIIb Creatinine improved since his previous hospitalization 2 months ago and currently appears to be at baseline.  Continue to monitor labs.  GERD Continue PPI.  DVT prophylaxis: SQ Heparin Code Status: Patient was listed as DNR/DNI during his previous hospitalization.  I spoke to his wife who is requesting him to be FULL CODE at this time until she speaks to their children in the morning. Family Communication: Wife updated. Level of care: Telemetry bed Admission status: It is my clinical opinion that referral for OBSERVATION is reasonable and necessary in this patient based on the above information provided. The aforementioned taken together are felt to place the patient at high risk for further clinical deterioration. However, it is anticipated that the patient may be medically stable for discharge from the hospital within 24 to 48 hours.  Editha Ram MD [facility]  If 7PM-7AM, please contact night-coverage [email or web address]  02/04/2024, 4:02 AM

## 2024-02-04 NOTE — Consult Note (Signed)
 Cardiology Consultation   Patient ID: Stephen Wilkins MRN: [id number]; DOB: Dec 08, 1927  Admit date: 02/03/2024 Date of Consult: 02/04/2024  PCP:  Norleen Lynwood ORN, MD   [facility] Providers Cardiologist:  None        Patient Profile: Stephen Wilkins is a 89 y.o. male with a hx of dementia, COPD, CLL, hypertension, hyperlipidemia, assumedly stage III,, prediabetes who is being seen 02/04/2024 for the evaluation of syncope at the request of virtual.  History of Present Illness: Mr. Retz was hospitalized 12/04/2023 with left leg weakness consistent with acute ischemic CVA.  He presented emergency room today with evaluation of syncope.  He returned from rehab to home 2 weeks prior.  Last night, the wife was helping the patient walk to the restroom where he had an episode of syncope.  The patient became pale and started vomiting prior to his syncopal episode.  Patient is lying in bed comfortably.  He has no acute complaints at this time.  His remained in sinus rhythm without bradycardia overnight.  Patient has dementia.  Patient is alert, and oriented to self.  He is not oriented to place or time.  Past Medical History:  Diagnosis Date   ABNORMAL ELECTROCARDIOGRAM 06/21/2007   ALLERGIC RHINITIS 03/23/2007   Anxiety state, unspecified 09/06/2013   ASTHMATIC BRONCHITIS, ACUTE 04/27/2007   BENIGN PROSTATIC HYPERTROPHY 03/23/2007   BRONCHITIS NOT SPECIFIED AS ACUTE OR CHRONIC 04/21/2007   BURSITIS, RIGHT HIP 07/31/2008   CKD (chronic kidney disease)    COPD (chronic obstructive pulmonary disease) (HCC) 04/19/2016   Cough 01/29/2009   Dementia (HCC) 09/01/2010   ERECTILE DYSFUNCTION 03/23/2007   Esophageal stricture    Falls frequently    FREQUENCY, URINARY 04/26/2010   GERD 03/23/2007   HIATAL HERNIA    HYPERLIPIDEMIA 03/23/2007   HYPERTENSION 03/23/2007   MILD COGNITIVE IMPAIRMENT SO STATED 05/19/2010   NEPHROLITHIASIS, HX OF 03/23/2007   Nocturnal confusion 12/04/2023    Daughter states sun downs at night   PSA, INCREASED 06/27/2008   REACTIVE AIRWAY DISEASE 01/14/2010   SCHATZKI'S RING    SCIATICA, RIGHT 04/28/2008   Unspecified hypothyroidism 09/06/2013   Vascular dementia [facility])     Past Surgical History:  Procedure Laterality Date   COLONOSCOPY WITH PROPOFOL  N/A 11/12/2022   Procedure: COLONOSCOPY WITH PROPOFOL ;  Surgeon: Wilhelmenia Aloha Raddle., MD;  Location: [facility];  Service: Gastroenterology;  Laterality: N/A;   ERCP N/A 12/28/2018   Procedure: ENDOSCOPIC RETROGRADE CHOLANGIOPANCREATOGRAPHY (ERCP);  Surgeon: Aneita Gwendlyn DASEN, MD;  Location: [facility];  Service: Endoscopy;  Laterality: N/A;   HEMOSTASIS CLIP PLACEMENT  11/12/2022   Procedure: HEMOSTASIS CLIP PLACEMENT;  Surgeon: Wilhelmenia Aloha Raddle., MD;  Location: [facility];  Service: Gastroenterology;;   POLYPECTOMY  11/12/2022   Procedure: POLYPECTOMY;  Surgeon: Wilhelmenia Aloha Raddle., MD;  Location: [facility];  Service: Gastroenterology;;   REMOVAL OF STONES  12/28/2018   Procedure: REMOVAL OF STONES;  Surgeon: Aneita Gwendlyn DASEN, MD;  Location: [facility];  Service: Endoscopy;;  balloon sweep   SPHINCTEROTOMY  12/28/2018   Procedure: SPHINCTEROTOMY;  Surgeon: Aneita Gwendlyn DASEN, MD;  Location: [facility];  Service: Endoscopy;;   TONSILLECTOMY       Home Medications:  Prior to Admission medications   Medication Sig Start Date End Date Taking? Authorizing Provider  albuterol  (PROVENTIL ) (2.5 MG/3ML) 0.083% nebulizer solution Take 3 mLs (2.5 mg total) by nebulization every 4 (four) hours as needed for wheezing or shortness of breath. 11/28/23   Alvia Corean LITTIE,  FNP  aspirin  EC 81 MG tablet Take 1 tablet (81 mg total) by mouth daily. Swallow whole. 12/08/23 03/07/24  [name] Ranks, MD  cephALEXin  (KEFLEX ) 500 MG capsule Take 1 capsule (500 mg total) by mouth 3 (three) times daily. 01/23/24   Norleen Lynwood ORN, MD  clopidogrel  (PLAVIX ) 75 MG tablet Take 1 tablet (75 mg total) by  mouth daily. 12/08/23 03/05/24  [name] Ranks, MD  donepezil  (ARICEPT ) 5 MG tablet Take 5 mg by mouth daily.    [provider]  finasteride  (PROSCAR ) 5 MG tablet Take 5 mg by mouth daily. 11/17/23   [provider]  Fluticasone -Umeclidin-Vilant (TRELEGY ELLIPTA ) 100-62.5-25 MCG/ACT AEPB Inhale 1 puff into the lungs daily. Patient taking differently: Inhale 1 puff into the lungs daily as needed (for wheezing and shortness of breath). 06/01/23   Norleen Lynwood ORN, MD  pantoprazole  (PROTONIX ) 40 MG tablet Take 1 tablet (40 mg total) by mouth daily. 06/01/23   Norleen Lynwood ORN, MD  polyethylene glycol powder (MIRALAX ) powder Take 1/2 capful by mouth once daily 01/28/15   Norleen Lynwood ORN, MD  rosuvastatin  (CRESTOR ) 10 MG tablet Take 1 tablet (10 mg total) by mouth daily. 06/01/23   Norleen Lynwood ORN, MD  solifenacin  (VESICARE ) 5 MG tablet Take 1 tablet (5 mg total) by mouth daily. 06/01/23   Norleen Lynwood ORN, MD  tamsulosin  (FLOMAX ) 0.4 MG CAPS capsule Take 1 capsule by mouth at bedtime 01/29/24   Norleen Lynwood ORN, MD    Scheduled Meds:  aspirin  EC  81 mg Oral Daily   budesonide -glycopyrrolate -formoterol   2 puff Inhalation BID   clopidogrel   75 mg Oral Daily   donepezil   5 mg Oral Daily   fesoterodine  4 mg Oral Daily   finasteride   5 mg Oral Daily   heparin  5,000 Units Subcutaneous Q8H   pantoprazole   40 mg Oral Daily   rosuvastatin   10 mg Oral Daily   tamsulosin   0.4 mg Oral QHS   Continuous Infusions:  sodium chloride      PRN Meds: acetaminophen  **OR** acetaminophen , albuterol   Allergies:    Allergies  Allergen Reactions   Atorvastatin  Other (See Comments)    REACTION: myalgia   Doxazosin Mesylate Other (See Comments)    REACTION: dizziness   Hydrocodone  Bit-Homatrop Mbr Other (See Comments)    anxiety   Hydrocodone  Anxiety    Social History:   Social History   Socioeconomic History   Marital status: Married    Spouse name: Lobbyist   Number of children: 2   Years of education:  Not on file   Highest education level: Not on file  Occupational History   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    Employer: RETIRED  Tobacco Use   Smoking status: Former    Types: Cigarettes   Smokeless tobacco: Never   Tobacco comments:    tried smoking in HS  Vaping Use   Vaping status: Never Used  Substance and Sexual Activity   Alcohol use: No   Drug use: No   Sexual activity: Never  Other Topics Concern   Not on file  Social History Narrative   Lives with wife/2025   Retired    Social Drivers of Corporate Investment Banker Strain: Low Risk  (10/02/2023)   Overall Financial Resource Strain (CARDIA)    Difficulty of Paying Living Expenses: Not hard at all  Food Insecurity: No Food Insecurity (02/04/2024)   Hunger Vital Sign  Worried About Programme Researcher, Broadcasting/film/video in the Last Year: Never true    Ran Out of Food in the Last Year: Never true  Transportation Needs: No Transportation Needs (02/04/2024)   PRAPARE - Administrator, Civil Service (Medical): No    Lack of Transportation (Non-Medical): No  Physical Activity: Inactive (10/02/2023)   Exercise Vital Sign    Days of Exercise per Week: 0 days    Minutes of Exercise per Session: 0 min  Stress: No Stress Concern Present (10/02/2023)   [employer] of Occupational Health - Occupational Stress Questionnaire    Feeling of Stress: Only a little  Social Connections: Moderately Isolated (02/04/2024)   Social Connection and Isolation Panel    Frequency of Communication with Friends and Family: More than three times a week    Frequency of Social Gatherings with Friends and Family: Twice a week    Attends Religious Services: Never    Database Administrator or Organizations: No    Attends Banker Meetings: Never    Marital Status: Married  Catering Manager Violence: Not At Risk (02/04/2024)   Humiliation, Afraid, Rape, and Kick questionnaire    Fear of Current or  Ex-Partner: No    Emotionally Abused: No    Physically Abused: No    Sexually Abused: No    Family History:    Family History  Problem Relation Age of Onset   Stroke Mother    Asthma Sister    Lung cancer Brother        smoked   Emphysema Brother        smoked   Stroke Maternal Uncle    Cancer Other        lung cancer   Hypertension Other      ROS:  Please see the history of present illness.   All other ROS reviewed and negative.     Physical Exam/Data: Vitals:   02/04/24 0230 02/04/24 0245 02/04/24 0357 02/04/24 0449  BP: 123/72 124/62  (!) 161/80  Pulse: 73 64  80  Resp: 16 (!) 22  17  Temp:   97.7 F (36.5 C) 98.2 F (36.8 C)  TempSrc:   Axillary   SpO2: 100% 100%  100%   No intake or output data in the 24 hours ending 02/04/24 0747    01/22/2024    9:58 AM 01/20/2024    2:51 AM 01/03/2024    9:43 AM  Last 3 Weights  Weight (lbs) 184 lb 175 lb 185 lb  Weight (kg) 83.462 kg 79.379 kg 83.915 kg     There is no height or weight on file to calculate BMI.  General:  Well nourished, well developed, in no acute distress HEENT: normal Neck: no JVD Vascular: No carotid bruits; Distal pulses 2+ bilaterally Cardiac:  normal S1, S2; RRR; no murmur  Lungs:  clear to auscultation bilaterally, no wheezing, rhonchi or rales  Abd: soft, nontender, no hepatomegaly  Ext: no edema Musculoskeletal:  No deformities, BUE and BLE strength normal and equal Skin: warm and dry  Neuro:  CNs 2-12 intact, no focal abnormalities noted Psych:  Normal affect   EKG:  The EKG was personally reviewed and demonstrates: Sinus rhythm Telemetry:  Telemetry was personally reviewed and demonstrates: Sinus rhythm  Relevant CV Studies: TTE  1. Left ventricular ejection fraction, by estimation, is 60 to 65%. The  left ventricle has normal function. The left ventricle has no regional  wall motion abnormalities. Left ventricular  diastolic parameters are  consistent with Grade I diastolic   dysfunction (impaired relaxation).   2. Right ventricular systolic function is normal. The right ventricular  size is normal.   3. Restricted posterior leaflet. The mitral valve is normal in structure.  No evidence of mitral valve regurgitation. Mild mitral stenosis. The mean  mitral valve gradient is 4.0 mmHg.   4. The aortic valve is calcified. There is mild calcification of the  aortic valve. There is moderate thickening of the aortic valve. Aortic  valve regurgitation is not visualized. Aortic valve sclerosis is present,  with no evidence of aortic valve  stenosis. Aortic valve mean gradient measures 5.0 mmHg. Aortic valve Vmax  measures 1.61 m/s.   5. The inferior vena cava is normal in size with greater than 50%  respiratory variability, suggesting right atrial pressure of 3 mmHg.   6. Agitated saline contrast bubble study was negative, with no evidence  of any interatrial shunt.   Laboratory Data: High Sensitivity Troponin:   Recent Labs  Lab 01/20/24 0303 01/20/24 0440 02/03/24 2343 02/04/24 0222  TROPONINIHS 11 11 7 9      Chemistry Recent Labs  Lab 02/03/24 2311 02/04/24 0538  NA 139 138  K 3.8 5.3*  CL 109 102  CO2 20* 25  GLUCOSE 115* 124*  BUN 23 26*  CREATININE 1.91* 2.35*  CALCIUM  6.9* 8.6*  MG 1.6* 2.0  GFRNONAA 32* 25*  ANIONGAP 10 11    Recent Labs  Lab 02/03/24 2311  PROT 4.8*  ALBUMIN  2.1*  AST 15  ALT 8  ALKPHOS 44  BILITOT 0.7   Lipids No results for input(s): CHOL, TRIG, HDL, LABVLDL, LDLCALC, CHOLHDL in the last 168 hours.  Hematology Recent Labs  Lab 02/03/24 2311 02/04/24 0538  WBC 8.8 9.4  RBC 2.78* 3.02*  HGB 8.7* 9.3*  HCT 27.4* 29.6*  MCV 98.6 98.0  MCH 31.3 30.8  MCHC 31.8 31.4  RDW 12.4 12.7  PLT 271 274   Thyroid  No results for input(s): TSH, FREET4 in the last 168 hours.  BNP Recent Labs  Lab 02/03/24 2311  BNP 71.7    DDimer No results for input(s): DDIMER in the last 168  hours.  Radiology/Studies:  DG Chest Port 1 View Result Date: 02/03/2024 EXAM: 1 VIEW(S) XRAY OF THE CHEST 02/03/2024 11:53:00 PM COMPARISON: 01/20/2024 CLINICAL HISTORY: syncope FINDINGS: LUNGS AND PLEURA: Mild hyperinflation. No focal pulmonary opacity. No pulmonary edema. No pleural effusion. No pneumothorax. HEART AND MEDIASTINUM: Aortic atherosclerosis. No acute abnormality of the cardiac and mediastinal silhouettes. BONES AND SOFT TISSUES: No acute osseous abnormality. IMPRESSION: 1. No acute cardiopulmonary process. 2. Mild hyperinflation. Electronically signed by: Franky Crease MD 02/03/2024 11:57 PM EDT RP Workstation: [id number]     Assessment and Plan: Syncope: Patient apparently had junctional rhythm.  Patient was in the bathroom, became pale, vomited, lost consciousness.  He is in sinus rhythm.  He has on no rate controlling medications.  EKG shows sinus rhythm with no acute changes.  Yoselyn Mcglade plan to monitor the patient for now.  Ricketta Colantonio work to obtain the results of his cardiac monitor.  If he does have a junctional rhythm that can be correlated with his episode of syncope, pacemaker implant would be.  2.  Dementia: Continue Aricept    3.  COPD: No shortness of breath.  Plan per primary team.  4.  CKD stage IIIb:  For questions or updates, please contact [facility] Please consult [email or web address] for contact info under  Signed, Nayib Remer Gladis Norton, MD  02/04/2024 7:47 AM

## 2024-02-04 NOTE — Progress Notes (Deleted)
 Cardiology Office Note:    Date:  02/04/2024   ID:  Stephen Wilkins, DOB 11/02/1927, MRN [id number]  PCP:  Norleen Lynwood ORN, MD   [facility] Providers Cardiologist:  None { Click to update primary MD,subspecialty MD or APP then REFRESH:1}    Referring MD: Norleen Lynwood ORN, MD   No chief complaint on file. ***  History of Present Illness:    Stephen Wilkins is a 89 y.o. male with  a hx of dementia, COPD, CLL, hypertension, hyperlipidemia, assumedly stage III,, prediabetes who is being seen for hospital follow up of syncope. Mr. Kreuser was hospitalized 12/04/2023 with left leg weakness consistent with acute ischemic CVA.  He presented emergency room today with evaluation of syncope.  He returned from rehab to home 2 weeks prior.  Last night, the wife was helping the patient walk to the restroom where he had an episode of syncope.  The patient became pale and started vomiting prior to his syncopal episode.   Past Medical History:  Diagnosis Date   ABNORMAL ELECTROCARDIOGRAM 06/21/2007   ALLERGIC RHINITIS 03/23/2007   Anxiety state, unspecified 09/06/2013   ASTHMATIC BRONCHITIS, ACUTE 04/27/2007   BENIGN PROSTATIC HYPERTROPHY 03/23/2007   BRONCHITIS NOT SPECIFIED AS ACUTE OR CHRONIC 04/21/2007   BURSITIS, RIGHT HIP 07/31/2008   CKD (chronic kidney disease)    COPD (chronic obstructive pulmonary disease) (HCC) 04/19/2016   Cough 01/29/2009   Dementia (HCC) 09/01/2010   ERECTILE DYSFUNCTION 03/23/2007   Esophageal stricture    Falls frequently    FREQUENCY, URINARY 04/26/2010   GERD 03/23/2007   HIATAL HERNIA    HYPERLIPIDEMIA 03/23/2007   HYPERTENSION 03/23/2007   MILD COGNITIVE IMPAIRMENT SO STATED 05/19/2010   NEPHROLITHIASIS, HX OF 03/23/2007   Nocturnal confusion 12/04/2023   Daughter states sun downs at night   PSA, INCREASED 06/27/2008   REACTIVE AIRWAY DISEASE 01/14/2010   SCHATZKI'S RING    SCIATICA, RIGHT 04/28/2008   Unspecified hypothyroidism 09/06/2013    Vascular dementia [facility])     Past Surgical History:  Procedure Laterality Date   COLONOSCOPY WITH PROPOFOL  N/A 11/12/2022   Procedure: COLONOSCOPY WITH PROPOFOL ;  Surgeon: Wilhelmenia Aloha Raddle., MD;  Location: [facility];  Service: Gastroenterology;  Laterality: N/A;   ERCP N/A 12/28/2018   Procedure: ENDOSCOPIC RETROGRADE CHOLANGIOPANCREATOGRAPHY (ERCP);  Surgeon: Aneita Gwendlyn DASEN, MD;  Location: [facility];  Service: Endoscopy;  Laterality: N/A;   HEMOSTASIS CLIP PLACEMENT  11/12/2022   Procedure: HEMOSTASIS CLIP PLACEMENT;  Surgeon: Wilhelmenia Aloha Raddle., MD;  Location: [facility];  Service: Gastroenterology;;   POLYPECTOMY  11/12/2022   Procedure: POLYPECTOMY;  Surgeon: Wilhelmenia Aloha Raddle., MD;  Location: [facility];  Service: Gastroenterology;;   REMOVAL OF STONES  12/28/2018   Procedure: REMOVAL OF STONES;  Surgeon: Aneita Gwendlyn DASEN, MD;  Location: [facility];  Service: Endoscopy;;  balloon sweep   SPHINCTEROTOMY  12/28/2018   Procedure: SPHINCTEROTOMY;  Surgeon: Aneita Gwendlyn DASEN, MD;  Location: [facility];  Service: Endoscopy;;   TONSILLECTOMY      Current Medications: No outpatient medications have been marked as taking for the 02/05/24 encounter (Appointment) with Kadince Boxley M, MD.     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    Employer: RETIRED  Tobacco Use   Smoking status: Former    Types: Cigarettes   Smokeless tobacco: Never   Tobacco comments:    tried smoking in HS  Vaping Use   Vaping status: Never Used  Substance and Sexual Activity   Alcohol use: No   Drug use: No   Sexual activity: Never  Other Topics  Concern   Not on file  Social History Narrative   Lives with wife/2025   Retired    Social Drivers of Corporate Investment Banker Strain: Low Risk  (10/02/2023)   Overall Financial Resource Strain (CARDIA)    Difficulty of Paying Living Expenses: Not hard at all  Food Insecurity: No Food Insecurity (02/04/2024)   Hunger Vital Sign    Worried About Running Out of Food in the Last Year: Never true    Ran Out of Food in the Last Year: Never true  Transportation Needs: No Transportation Needs (02/04/2024)   PRAPARE - Administrator, Civil Service (Medical): No    Lack of Transportation (Non-Medical): No  Physical Activity: Inactive (10/02/2023)   Exercise Vital Sign    Days of Exercise per Week: 0 days    Minutes of Exercise per Session: 0 min  Stress: No Stress Concern Present (10/02/2023)   [employer] of Occupational Health - Occupational Stress Questionnaire    Feeling of Stress: Only a little  Social Connections: Moderately Isolated (02/04/2024)   Social Connection and Isolation Panel    Frequency of Communication with Friends and Family: More than three times a week    Frequency of Social Gatherings with Friends and Family: Twice a week    Attends Religious Services: Never    Database Administrator or Organizations: No    Attends Engineer, Structural: Never    Marital Status: Married     Family History: The patient's ***family history includes Asthma in his sister; Cancer in an other family member; Emphysema in his brother; Hypertension in an other family member; Lung cancer in his brother; Stroke in his maternal uncle and mother.  ROS:   Please see the history of present illness.    *** All other systems reviewed and are negative.  EKGs/Labs/Other Studies Reviewed:    The following studies were reviewed today: ***      Recent Labs: 11/22/2023: TSH 5.55 02/03/2024: ALT 8; B Natriuretic Peptide 71.7 02/04/2024: BUN 26; Creatinine, Ser 2.35;  Hemoglobin 9.3; Magnesium  2.0; Platelets 274; Potassium 5.3; Sodium 138  Recent Lipid Panel    Component Value Date/Time   CHOL 119 12/05/2023 0435   TRIG 76 12/05/2023 0435   HDL 55 12/05/2023 0435   CHOLHDL 2.2 12/05/2023 0435   VLDL 15 12/05/2023 0435   LDLCALC 49 12/05/2023 0435   LDLCALC 137 (H) 11/22/2019 1054   LDLDIRECT 74.0 04/25/2022 1507     Risk Assessment/Calculations:   {Does this patient have ATRIAL FIBRILLATION?:[phone]}            Physical Exam:    VS:  There were no vitals taken for this visit.    Wt Readings from Last 3 Encounters:  01/22/24 184 lb (83.5 kg)  01/20/24 175 lb (79.4 kg)  01/03/24 185 lb (83.9 kg)     GEN: *** Well nourished, well developed in no acute distress HEENT: Normal NECK: No JVD; No carotid bruits LYMPHATICS: No lymphadenopathy CARDIAC: ***RRR, no  murmurs, rubs, gallops RESPIRATORY:  Clear to auscultation without rales, wheezing or rhonchi  ABDOMEN: Soft, non-tender, non-distended MUSCULOSKELETAL:  No edema; No deformity  SKIN: Warm and dry NEUROLOGIC:  Alert and oriented x 3 PSYCHIATRIC:  Normal affect   ASSESSMENT:    No diagnosis found. PLAN:    In order of problems listed above:  ***      {Are you ordering a CV Procedure (e.g. stress test, cath, DCCV, TEE, etc)?   Press F2        :[id number]}    Medication Adjustments/Labs and Tests Ordered: Current medicines are reviewed at length with the patient today.  Concerns regarding medicines are outlined above.  No orders of the defined types were placed in this encounter.  No orders of the defined types were placed in this encounter.   There are no Patient Instructions on file for this visit.   Signed, Derika Eckles, MD  02/04/2024 5:11 PM    [facility]

## 2024-02-04 NOTE — Plan of Care (Signed)

## 2024-02-05 ENCOUNTER — Telehealth: Payer: Self-pay

## 2024-02-05 ENCOUNTER — Telehealth: Payer: Self-pay | Admitting: Emergency Medicine

## 2024-02-05 ENCOUNTER — Ambulatory Visit: Admitting: Cardiology

## 2024-02-05 DIAGNOSIS — F01B4 Vascular dementia, moderate, with anxiety: Secondary | ICD-10-CM | POA: Diagnosis not present

## 2024-02-05 DIAGNOSIS — R296 Repeated falls: Secondary | ICD-10-CM | POA: Diagnosis not present

## 2024-02-05 DIAGNOSIS — E785 Hyperlipidemia, unspecified: Secondary | ICD-10-CM | POA: Diagnosis not present

## 2024-02-05 DIAGNOSIS — M1712 Unilateral primary osteoarthritis, left knee: Secondary | ICD-10-CM | POA: Diagnosis not present

## 2024-02-05 DIAGNOSIS — E039 Hypothyroidism, unspecified: Secondary | ICD-10-CM | POA: Diagnosis not present

## 2024-02-05 DIAGNOSIS — I492 Junctional premature depolarization: Secondary | ICD-10-CM | POA: Diagnosis not present

## 2024-02-05 DIAGNOSIS — R609 Edema, unspecified: Secondary | ICD-10-CM | POA: Diagnosis not present

## 2024-02-05 DIAGNOSIS — J411 Mucopurulent chronic bronchitis: Secondary | ICD-10-CM | POA: Diagnosis not present

## 2024-02-05 DIAGNOSIS — F01518 Vascular dementia, unspecified severity, with other behavioral disturbance: Secondary | ICD-10-CM | POA: Diagnosis not present

## 2024-02-05 DIAGNOSIS — I69344 Monoplegia of lower limb following cerebral infarction affecting left non-dominant side: Secondary | ICD-10-CM | POA: Diagnosis not present

## 2024-02-05 DIAGNOSIS — Z801 Family history of malignant neoplasm of trachea, bronchus and lung: Secondary | ICD-10-CM | POA: Diagnosis not present

## 2024-02-05 DIAGNOSIS — Z7902 Long term (current) use of antithrombotics/antiplatelets: Secondary | ICD-10-CM | POA: Diagnosis not present

## 2024-02-05 DIAGNOSIS — F0390 Unspecified dementia without behavioral disturbance: Secondary | ICD-10-CM | POA: Diagnosis not present

## 2024-02-05 DIAGNOSIS — Z823 Family history of stroke: Secondary | ICD-10-CM | POA: Diagnosis not present

## 2024-02-05 DIAGNOSIS — Z7401 Bed confinement status: Secondary | ICD-10-CM | POA: Diagnosis not present

## 2024-02-05 DIAGNOSIS — N400 Enlarged prostate without lower urinary tract symptoms: Secondary | ICD-10-CM | POA: Diagnosis not present

## 2024-02-05 DIAGNOSIS — Z7189 Other specified counseling: Secondary | ICD-10-CM | POA: Diagnosis not present

## 2024-02-05 DIAGNOSIS — Z825 Family history of asthma and other chronic lower respiratory diseases: Secondary | ICD-10-CM | POA: Diagnosis not present

## 2024-02-05 DIAGNOSIS — J4489 Other specified chronic obstructive pulmonary disease: Secondary | ICD-10-CM | POA: Diagnosis not present

## 2024-02-05 DIAGNOSIS — Z7982 Long term (current) use of aspirin: Secondary | ICD-10-CM | POA: Diagnosis not present

## 2024-02-05 DIAGNOSIS — K219 Gastro-esophageal reflux disease without esophagitis: Secondary | ICD-10-CM | POA: Diagnosis not present

## 2024-02-05 DIAGNOSIS — Z87891 Personal history of nicotine dependence: Secondary | ICD-10-CM | POA: Diagnosis not present

## 2024-02-05 DIAGNOSIS — R5381 Other malaise: Secondary | ICD-10-CM | POA: Diagnosis not present

## 2024-02-05 DIAGNOSIS — R55 Syncope and collapse: Secondary | ICD-10-CM | POA: Diagnosis not present

## 2024-02-05 DIAGNOSIS — Z515 Encounter for palliative care: Secondary | ICD-10-CM | POA: Diagnosis not present

## 2024-02-05 DIAGNOSIS — N1832 Chronic kidney disease, stage 3b: Secondary | ICD-10-CM | POA: Diagnosis not present

## 2024-02-05 DIAGNOSIS — Z79899 Other long term (current) drug therapy: Secondary | ICD-10-CM | POA: Diagnosis not present

## 2024-02-05 DIAGNOSIS — Z66 Do not resuscitate: Secondary | ICD-10-CM | POA: Diagnosis not present

## 2024-02-05 DIAGNOSIS — D649 Anemia, unspecified: Secondary | ICD-10-CM | POA: Diagnosis not present

## 2024-02-05 DIAGNOSIS — Z8249 Family history of ischemic heart disease and other diseases of the circulatory system: Secondary | ICD-10-CM | POA: Diagnosis not present

## 2024-02-05 DIAGNOSIS — I129 Hypertensive chronic kidney disease with stage 1 through stage 4 chronic kidney disease, or unspecified chronic kidney disease: Secondary | ICD-10-CM | POA: Diagnosis not present

## 2024-02-05 LAB — BASIC METABOLIC PANEL WITH GFR
Anion gap: 8 (ref 5–15)
BUN: 25 mg/dL — ABNORMAL HIGH (ref 8–23)
CO2: 23 mmol/L (ref 22–32)
Calcium: 8.2 mg/dL — ABNORMAL LOW (ref 8.9–10.3)
Chloride: 105 mmol/L (ref 98–111)
Creatinine, Ser: 2.18 mg/dL — ABNORMAL HIGH (ref 0.61–1.24)
GFR, Estimated: 28 mL/min — ABNORMAL LOW (ref >60)
Glucose, Bld: 112 mg/dL — ABNORMAL HIGH (ref 70–99)
Potassium: 5 mmol/L (ref 3.5–5.1)
Sodium: 136 mmol/L (ref 135–145)

## 2024-02-05 NOTE — Plan of Care (Signed)

## 2024-02-05 NOTE — Telephone Encounter (Addendum)
   Cardiac Monitor Alert  Date of alert:  02/05/2024   Patient Name: Stephen Wilkins  DOB: [birth year]  MRN: [id number]   [facility] Cardiologist: Scheduled to see Lum Louis, NP 03/19/2024 [facility] EP:  None    Monitor Information: Cardiac Event Monitor [[employer]]  Reason:  CVA Ordering provider:  Dr Anner   Alert Sinus Rhythm and sinus bradycardia with PAC's and junctional rhythm  This is the 1st alert for this rhythm.   Next Cardiology Appointment   Date:  03/19/2024  Provider:  Lum Louis  The patient was not contacted today as he is currently hospitalized.  EMS was called after this event due to syncope.   Other: Pt has been evaluated by cardiology in the hospital.  Will await further recommendations and patient discharge. Monitor reviewed by DOD, Dr Francyne.  Aldona FORBES Marina, RN  02/05/2024 8:18 AM

## 2024-02-05 NOTE — Progress Notes (Signed)
 PROGRESS NOTE    Stephen Wilkins  FMW:[id number] DOB: 07-Mar-1928 DOA: 02/03/2024 PCP: Norleen Lynwood ORN, MD    Brief Narrative:  89 year old with advanced dementia, COPD, stroke, BPH, hypertension, hyperlipidemia, CKD stage IIIb, recent diagnosis of acute ischemic stroke presented from home for evaluation of syncope.  Patient lives at home with 24-hour support.  He was also wearing 30-day cardiac monitor as a stroke workup on discharge.  In the emergency room, blood pressure stable.  On room air.  EKG with sinus rhythm.  Cardiac monitor at home shows junctional rhythm at the time of syncope.  Admitted with cardiology consultation.  Subjective: Patient seen and examined.  No overnight events.  Remains fairly sinus rhythm on cardiac monitor since admission.  His caretaker at the bedside.  Patient is oriented to himself only.  He does not know why he is in the hospital.  He denies any complaints.   Assessment & Plan:   Syncope with junctional rhythm: Currently stable.  Not on any rate control medications.  Complete cardiac monitor results to be reviewed.  Cardiology following. With his advanced dementia, not sure he will be a candidate for pacemaker.  Start mobilizing.  Will consult PT OT.  History of stroke with residual left lower extremity weakness: On aspirin , Plavix  and Crestor .  No evidence of new neurological deficits.  Dementia with behavioral disturbances: Delirium precautions.  Continue Aricept .  Hypomagnesemia: Replaced.  CKD stage IIIb: At about baseline.  GERD: On PPI.    DVT prophylaxis: heparin injection 5,000 Units Start: 02/04/24 1400   Code Status: Full code Family Communication: None at the bedside Disposition Plan: Status is: Observation The patient will require care spanning > 2 midnights and should be moved to inpatient because: Inpatient investigations     Consultants:  Cardiology  Procedures:  None  Antimicrobials:  None     Objective: Vitals:    02/05/24 0437 02/05/24 0508 02/05/24 0750 02/05/24 0828  BP: (!) 158/93  (!) 151/77   Pulse: (!) 103  98   Resp:   20   Temp: (!) 101 F (38.3 C) 98.9 F (37.2 C) 98.2 F (36.8 C)   TempSrc:  Oral    SpO2: 90%  93% 94%    Intake/Output Summary (Last 24 hours) at 02/05/2024 1038 Last data filed at 02/05/2024 0750 Gross per 24 hour  Intake 357.84 ml  Output 2000 ml  Net -1642.16 ml   There were no vitals filed for this visit.  Examination:  General exam: Appears calm and comfortable.  Pleasant but confused. Respiratory system: Clear to auscultation. Respiratory effort normal.  No added sounds. Cardiovascular system: S1 & S2 heard, RRR.  No pedal edema. Gastrointestinal system: Abdomen is nondistended, soft and nontender. No organomegaly or masses felt. Normal bowel sounds heard. Central nervous system: Alert and awake.  Oriented to himself. Does not follow commands consistently.  Left lower extremity is slightly weaker than right.    Data Reviewed: I have personally reviewed following labs and imaging studies  CBC: Recent Labs  Lab 02/03/24 2311 02/04/24 0538  WBC 8.8 9.4  HGB 8.7* 9.3*  HCT 27.4* 29.6*  MCV 98.6 98.0  PLT 271 274   Basic Metabolic Panel: Recent Labs  Lab 02/03/24 2311 02/04/24 0538 02/05/24 0156  NA 139 138 136  K 3.8 5.3* 5.0  CL 109 102 105  CO2 20* 25 23  GLUCOSE 115* 124* 112*  BUN 23 26* 25*  CREATININE 1.91* 2.35* 2.18*  CALCIUM  6.9* 8.6*  8.2*  MG 1.6* 2.0  --    GFR: CrCl cannot be calculated (Unknown ideal weight.). Liver Function Tests: Recent Labs  Lab 02/03/24 2311  AST 15  ALT 8  ALKPHOS 44  BILITOT 0.7  PROT 4.8*  ALBUMIN  2.1*   No results for input(s): LIPASE, AMYLASE in the last 168 hours. No results for input(s): AMMONIA in the last 168 hours. Coagulation Profile: Recent Labs  Lab 02/03/24 2343  INR 1.1   Cardiac Enzymes: No results for input(s): CKTOTAL, CKMB, CKMBINDEX, TROPONINI in the  last 168 hours. BNP (last 3 results) No results for input(s): PROBNP in the last 8760 hours. HbA1C: No results for input(s): HGBA1C in the last 72 hours. CBG: Recent Labs  Lab 02/03/24 2313  GLUCAP 132*   Lipid Profile: No results for input(s): CHOL, HDL, LDLCALC, TRIG, CHOLHDL, LDLDIRECT in the last 72 hours. Thyroid  Function Tests: No results for input(s): TSH, T4TOTAL, FREET4, T3FREE, THYROIDAB in the last 72 hours. Anemia Panel: No results for input(s): VITAMINB12, FOLATE, FERRITIN, TIBC, IRON, RETICCTPCT in the last 72 hours. Sepsis Labs: No results for input(s): PROCALCITON, LATICACIDVEN in the last 168 hours.  No results found for this or any previous visit (from the past 240 hours).       Radiology Studies: DG Chest Port 1 View Result Date: 02/03/2024 EXAM: 1 VIEW(S) XRAY OF THE CHEST 02/03/2024 11:53:00 PM COMPARISON: 01/20/2024 CLINICAL HISTORY: syncope FINDINGS: LUNGS AND PLEURA: Mild hyperinflation. No focal pulmonary opacity. No pulmonary edema. No pleural effusion. No pneumothorax. HEART AND MEDIASTINUM: Aortic atherosclerosis. No acute abnormality of the cardiac and mediastinal silhouettes. BONES AND SOFT TISSUES: No acute osseous abnormality. IMPRESSION: 1. No acute cardiopulmonary process. 2. Mild hyperinflation. Electronically signed by: Franky Crease MD 02/03/2024 11:57 PM EDT RP Workstation: [id number]        Scheduled Meds:  aspirin  EC  81 mg Oral Daily   budesonide -glycopyrrolate -formoterol   2 puff Inhalation BID   clopidogrel   75 mg Oral Daily   donepezil   5 mg Oral Daily   fesoterodine  4 mg Oral Daily   finasteride   5 mg Oral Daily   heparin  5,000 Units Subcutaneous Q8H   pantoprazole   40 mg Oral Daily   rosuvastatin   10 mg Oral Daily   tamsulosin   0.4 mg Oral QHS   Continuous Infusions:   LOS: 0 days    Time spent: 40 minutes    Renato Applebaum, MD [facility]

## 2024-02-05 NOTE — Telephone Encounter (Signed)
 Spoke with pt wife, she was instructed to keep the monitor at the home until patient is discharged. She will get a DPR at the hospital to be able to talk to her.

## 2024-02-05 NOTE — Progress Notes (Cosign Needed Addendum)
  Patient Name: Stephen Wilkins Date of Encounter: 02/05/2024  Primary Cardiologist: None Electrophysiologist: None  Interval Summary   NAEON.  Outside agency caregiver is at bedside. Patient is laying in bed asleep. Caregiver says his activity is very odd, he is usually up and about around the house with walker, not laying in bed asleep.   Patient denies concerns or complaints.  On follow-up he is awake eating in bed. Caregiver says he has reduced appetite which is abnormal for him  Per wife, patient was walking to bathroom with a caregiver around 9:30pm on 11/1, and legs gave out in bathroom prior to reaching the toilet. She recalls that his pants were still up. He did not syncopize at this point. The caregiver and wife were unable to lift the patient, so they called neighbors to help > lifted patient to transfer chair. Then wife called EMS to help with transfer back to bed. With EMS present, patient then vomited and had episode of syncope. Per wife, he has had significant indigestion lately.   Vital Signs    Vitals:   02/05/24 0437 02/05/24 0508 02/05/24 0750 02/05/24 0828  BP: (!) 158/93  (!) 151/77   Pulse: (!) 103  98   Resp:   20   Temp: (!) 101 F (38.3 C) 98.9 F (37.2 C) 98.2 F (36.8 C)   TempSrc:  Oral    SpO2: 90%  93% 94%    Intake/Output Summary (Last 24 hours) at 02/05/2024 1111 Last data filed at 02/05/2024 0750 Gross per 24 hour  Intake 357.84 ml  Output 2000 ml  Net -1642.16 ml   There were no vitals filed for this visit.  Physical Exam    GEN- awake A&O x 2 Lungs- Clear to ausculation bilaterally, normal work of breathing Cardiac- Regular rate and rhythm, no murmurs, rubs or gallops GI- soft, NT, ND, + BS Extremities- no clubbing or cyanosis. No edema  Telemetry    SR (personally reviewed)  Hospital Course    Stephen Wilkins is a 89 y.o. male with PMH of advanced dementia, COPD, CLL, CVA, BPH, HTN, HLD, CKD-3b who had a recent CVA admitted for  syncope.   Assessment & Plan    #) syncope #) recurrent CVA #) advanced dementia   No further syncopal episodes.   Ambulatory heart rhythm monitor appears junctional bradycardia with ventricular standstill vs loss of connection at 2206 on 11/1. Given that this was iso emesis, likely vagally mediated  With his advanced dementia, reduced appetite, no indication for PPM implant at this time Discussed with patient's wife, who is in agreement.   EP will sign off at this time, but remains available. Please reconsult if needed.      For questions or updates, please contact [facility] Please consult [email or web address] for contact info under     Signed, Weronika Birch, NP  02/05/2024, 11:11 AM

## 2024-02-05 NOTE — Progress Notes (Signed)
 Transition of Care [facility]) - Inpatient Brief Assessment   Patient Details  Name: Stephen Wilkins MRN: [id number] Date of Birth: 01/02/1928  Transition of Care [facility]) CM/SW Contact:    Rosaline JONELLE Joe, RN Phone Number: 02/05/2024, 4:23 PM   Clinical Narrative: Patient admitted for syncope.  Patient lives with spouse at the home and SNF placement is recommended.  DME at the home includes RW, WC.  Patient was agreeable to SNF placement - pending PT evaluation.  FL2 will be completed and patient faxed out for bed offers once patient is seen by PT.  OT evaluation placed but still waiting on PT.  The patient's wife states that patient's daughter - [name] is available by phone to assist with bed offers and placement choice.   Transition of Care Asessment: Insurance and Status: (P) Insurance coverage has been reviewed Patient has primary care physician: (P) Yes Home environment has been reviewed: (P) from home with spouse Prior level of function:: (P) RW Prior/Current Home Services: (P) No current home services Social Drivers of Health Review: (P) SDOH reviewed needs interventions Readmission risk has been reviewed: (P) Yes Transition of care needs: (P) transition of care needs identified, TOC will continue to follow

## 2024-02-05 NOTE — Progress Notes (Signed)
 Met with patient's wife at the bedside . Updated her that patient does not need a pacemaker now and is not a candidate for pace maker due to advanced dementia. He is very debilitated since last admission . Needs SNF, may benefit with ongoing palliative care support. DNR/DNI confirmed . Son and daughter updated as emergency contact and ok to release information.

## 2024-02-05 NOTE — Evaluation (Signed)
 Occupational Therapy Evaluation Patient Details Name: Stephen Wilkins MRN: [id number] DOB: 1927/11/21 Today's Date: 02/05/2024   History of Present Illness   Pt is a 89 y/o male presenting after syncopal episode at home. Cardiology following and recommend conservative mgmt. Recent admission 9/1-9/5 for acute CVA, syncope and AKI. PMH: hypertension, hyperlipidemia, GERD, dementia, COPD, anxiety, hypothyroidism, esophageal stricture, CKD, recent fall, GI bleed and thoracic aortic aneurysm.     Clinical Impressions PTA, pt lives with spouse, typically ambulatory with a RW and prior to CVA in Sept 2025 was managing ADLs. Pt with additional caregiver support through [facility]. Family not present at time of eval to provide additional info. Pt presents now with deficits in strength, balance and endurance along with baseline cognitive deficits. Pt requires Mod-Total A for bed mobility, Max A for standing trials using RW. Pt requires Mod A for UB ADL and up to Total A for LB ADLs. Based on current presentation and being high risk for falls, recommend continued inpatient follow up therapy, <3 hours/day at DC.  BP supine: 95/49 BP sitting EOB: 134/75     If plan is discharge home, recommend the following:   A lot of help with walking and/or transfers;Two people to help with walking and/or transfers;A lot of help with bathing/dressing/bathroom;Two people to help with bathing/dressing/bathroom     Functional Status Assessment   Patient has had a recent decline in their functional status and demonstrates the ability to make significant improvements in function in a reasonable and predictable amount of time.     Equipment Recommendations   None recommended by OT     Recommendations for Other Services         Precautions/Restrictions   Precautions Precautions: Fall Recall of Precautions/Restrictions: Impaired Precaution/Restrictions Comments: hx of syncope Restrictions Weight  Bearing Restrictions Per Provider Order: No     Mobility Bed Mobility Overal bed mobility: Needs Assistance Bed Mobility: Supine to Sit, Sit to Supine     Supine to sit: Mod assist, HOB elevated, Used rails Sit to supine: Total assist   General bed mobility comments: Able to assist in lifting trunk. assist to initiate/bring LE to EOB and scoot hips fully. Total A to return to bed as pt with poor initiation reaching for other items despite multimodal cues    Transfers Overall transfer level: Needs assistance Equipment used: Rolling walker (2 wheels) Transfers: Sit to/from Stand Sit to Stand: Max assist, From elevated surface           General transfer comment: on third attempt, able to stand with RW but significant Max A needed and elevated bed. unable to maintain.      Balance Overall balance assessment: Needs assistance Sitting-balance support: No upper extremity supported, Feet supported Sitting balance-Leahy Scale: Fair     Standing balance support: Bilateral upper extremity supported, During functional activity Standing balance-Leahy Scale: Poor                             ADL either performed or assessed with clinical judgement   ADL Overall ADL's : Needs assistance/impaired Eating/Feeding: Set up;Sitting   Grooming: Minimal assistance;Sitting   Upper Body Bathing: Moderate assistance;Sitting   Lower Body Bathing: Maximal assistance;Sitting/lateral leans;Bed level   Upper Body Dressing : Moderate assistance;Sitting   Lower Body Dressing: Total assistance;Sitting/lateral leans;Bed level       Toileting- Clothing Manipulation and Hygiene: Total assistance;Sitting/lateral lean;Bed level  Vision Baseline Vision/History: 1 Wears glasses Ability to See in Adequate Light: 0 Adequate Patient Visual Report: No change from baseline Vision Assessment?: No apparent visual deficits Additional Comments: appears [facility]     Perception          Praxis         Pertinent Vitals/Pain Pain Assessment Pain Assessment: No/denies pain     Extremity/Trunk Assessment Upper Extremity Assessment Upper Extremity Assessment: Generalized weakness;Right hand dominant   Lower Extremity Assessment Lower Extremity Assessment: Defer to PT evaluation   Cervical / Trunk Assessment Cervical / Trunk Assessment: Normal   Communication Communication Communication: Impaired Factors Affecting Communication: Hearing impaired   Cognition Arousal: Alert Behavior During Therapy: WFL for tasks assessed/performed, Restless, Flat affect Cognition: History of cognitive impairments             OT - Cognition Comments: hx of dementia. pleasant, answering basic questions. expected memory deficits. Once sitting EOB, pt with restlessness reaching out to dynamap and later RW when cued to lay back down. poor motor planning and initiation at times                 Following commands: Impaired Following commands impaired: Follows one step commands with increased time, Follows one step commands inconsistently     Cueing  General Comments   Cueing Techniques: Verbal cues;Tactile cues;Gestural cues  per nursing, was able to assist pt to walk to bathroom yesterday   Exercises     Shoulder Instructions      Home Living Family/patient expects to be discharged to:: Private residence Living Arrangements: Spouse/significant other Available Help at Discharge: Family;Available 24 hours/day Type of Home: House Home Access: Stairs to enter [employer] of Steps: 2 Entrance Stairs-Rails: Right;Left Home Layout: One level     Bathroom Shower/Tub: Producer, Television/film/video: Standard Bathroom Accessibility: Yes   Home Equipment: Agricultural [employer]ant (2 wheels);Grab bars - tub/shower;Rollator (4 wheels);Transport chair;Cane - single point;Shower seat (railing in hallway, next to bed)   Additional Comments: [facility] 6-7  days/wk, 1 PM - 6 PM and also 6 PM -10 PM to help pt and pt spouse get ready for bed. Pt daughter lives 4 hours away, son lives 1.5 hours away. Pt spouse unable to help physically - on oxygen. Home setup from prior admission Sept 2025      Prior Functioning/Environment Prior Level of Function : Needs assist;Patient poor historian/Family not available             Mobility Comments: RW for mobility ADLs Comments: recently independent with ADL's. [facility] helps with taking trash out, bringing mail in. Also has help for cleaning and yard work. Pt can drive scooter around [place]. Recent SNF rehab stay and discharge home- unsure how pt had been doing since DC back to home    OT Problem List: Decreased strength;Decreased activity tolerance;Impaired balance (sitting and/or standing);Decreased cognition;Decreased safety awareness;Decreased knowledge of use of DME or AE   OT Treatment/Interventions: Self-care/ADL training;Energy conservation;Therapeutic exercise;DME and/or AE instruction;Therapeutic activities;Patient/family education;Balance training      OT Goals(Current goals can be found in the care plan section)   Acute Rehab OT Goals Patient Stated Goal: agreeable for OT eval. no goals stated today but agrees with need to get stronger OT Goal Formulation: With patient Time For Goal Achievement: 02/19/24 Potential to Achieve Goals: Good ADL Goals Pt Will Perform Grooming: with min assist;standing Pt Will Perform Lower Body Dressing: with mod assist;sit to/from stand Pt Will Transfer to  Toilet: with mod assist;stand pivot transfer;bedside commode Additional ADL Goal #1: Pt to complete bed mobility with Supervision in prep for EOB/OOB ADLs   OT Frequency:  Min 2X/week    Co-evaluation              AM-PAC OT 6 Clicks Daily Activity     Outcome Measure Help from another person eating meals?: A Little Help from another person taking care of personal grooming?: A  Little Help from another person toileting, which includes using toliet, bedpan, or urinal?: Total Help from another person bathing (including washing, rinsing, drying)?: A Lot Help from another person to put on and taking off regular upper body clothing?: A Lot Help from another person to put on and taking off regular lower body clothing?: Total 6 Click Score: 12   End of Session Equipment Utilized During Treatment: Gait belt;Rolling walker (2 wheels) Nurse Communication: Mobility status  Activity Tolerance: Patient tolerated treatment well Patient left: in bed;with call bell/phone within reach;with chair alarm set  OT Visit Diagnosis: Unsteadiness on feet (R26.81);Other abnormalities of gait and mobility (R26.89);Muscle weakness (generalized) (M62.81)                Time: 8645-8584 OT Time Calculation (min): 21 min Charges:  OT General Charges $OT Visit: 1 Visit OT Evaluation $OT Eval Moderate Complexity: 1 Mod  Mliss NOVAK, OTR/L Acute Rehab Services Office: [phone]   Mliss Fish 02/05/2024, 2:40 PM

## 2024-02-05 NOTE — Telephone Encounter (Signed)
 Patient's wife is requesting to speak with someone in regard to a recent heart monitor. Please advise.

## 2024-02-05 NOTE — Care Management Obs Status (Addendum)
 MEDICARE OBSERVATION STATUS NOTIFICATION   Patient Details  Name: CEDERIC MOZLEY MRN: [id number] Date of Birth: 1928-03-20   Medicare Observation Status Notification Given:  Yes  Verbally reviewed observation notice with Orlean Daring telephonically at [phone].  Will deliver a copy to the patients room.    Qunisha Bryk 02/05/2024, 9:30 AM

## 2024-02-06 DIAGNOSIS — F01B4 Vascular dementia, moderate, with anxiety: Secondary | ICD-10-CM

## 2024-02-06 DIAGNOSIS — Z515 Encounter for palliative care: Secondary | ICD-10-CM | POA: Diagnosis not present

## 2024-02-06 DIAGNOSIS — Z7189 Other specified counseling: Secondary | ICD-10-CM | POA: Diagnosis not present

## 2024-02-06 DIAGNOSIS — Z789 Other specified health status: Secondary | ICD-10-CM

## 2024-02-06 DIAGNOSIS — D649 Anemia, unspecified: Secondary | ICD-10-CM

## 2024-02-06 DIAGNOSIS — J411 Mucopurulent chronic bronchitis: Secondary | ICD-10-CM

## 2024-02-06 DIAGNOSIS — R55 Syncope and collapse: Secondary | ICD-10-CM | POA: Diagnosis not present

## 2024-02-06 DIAGNOSIS — Z66 Do not resuscitate: Secondary | ICD-10-CM

## 2024-02-06 NOTE — Telephone Encounter (Signed)
 Reviewed with monitor department and no events today

## 2024-02-06 NOTE — [place] FL2 (Signed)
 [place]  MEDICAID FL2 LEVEL OF CARE FORM     IDENTIFICATION  Patient Name: Stephen Wilkins Birthdate: 01-06-1928 Sex: male Admission Date (Current Location): 02/03/2024  [facility] and [place] Number:  Producer, Television/film/video and Address:  [facility], [address], [place], [place] [address]      Provider Number: [id number]  Attending Physician Name and Address:  Raenelle Coria, MD  Relative Name and Phone Number:  Timur Nibert, spouse - [phone]    Current Level of Care: Hospital Recommended Level of Care: Skilled Nursing Facility Prior Approval Number:    Date Approved/Denied:   PASRR Number: [id number]  Discharge Plan: SNF    Current Diagnoses: Patient Active Problem List   Diagnosis Date Noted   Syncope 02/04/2024   Hypomagnesemia 02/04/2024   Generalized weakness 01/24/2024   Cloudy urine 01/24/2024   Closed fracture of bone of left foot 01/24/2024   History of stroke 01/24/2024   ICAO (internal carotid artery occlusion), right 12/07/2023   Stroke (HCC) 12/04/2023   Productive cough 11/22/2023   Recurrent falls 11/22/2023   CKD (chronic kidney disease) stage 4, GFR 15-29 ml/min (HCC) 11/22/2023   Unqualified visual loss, both eyes 06/01/2023   PAD (peripheral artery disease) 06/01/2023   Vertigo 11/29/2022   History of GI diverticular bleed 11/13/2022   History of colonic polyps 11/13/2022   Lower GI bleeding 11/12/2022   Lower GI bleed 11/11/2022   Hematochezia 11/11/2022   Unintentional weight loss 11/11/2022   Chronic constipation 11/11/2022   Normocytic anemia 11/11/2022   Acute GI bleeding 11/11/2022   OAB (overactive bladder) 11/09/2022   Lesion of ear 11/09/2022   Epithelial (juvenile) corneal dystrophy, 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   Shortness of breath 08/02/2022   Thoracic aortic aneurysm without rupture 08/02/2022   Asthmatic bronchitis 08/02/2022   Acute hypoxemic respiratory failure (HCC) 06/28/2022   Severe persistent  asthma with exacerbation (HCC) 06/05/2022   Unspecified corneal edema 06/02/2022   Asthma 04/25/2022   Benign prostatic hyperplasia without urinary obstruction 04/25/2022   Hearing loss 04/25/2022   HTN (hypertension) 04/25/2022   Mixed hyperlipidemia 04/25/2022   Encounter for fitting and adjustment of hearing aid 04/25/2022   Encounter for well adult exam with abnormal findings 04/25/2022   Urinary frequency 10/24/2021   Heart murmur 10/24/2021   Low energy 10/21/2021   Vitamin D  deficiency 04/16/2021   Pain in joint of left shoulder 01/23/2020   Bilateral shoulder pain 11/22/2019   Myalgia 11/22/2019   Gait disorder 11/22/2019   Chronic pain of right thumb 05/17/2019   Primary osteoarthritis of first carpometacarpal joint of right hand 05/17/2019   Trigger finger of right thumb 05/17/2019   Corneal epithelial basement membrane dystrophy 03/04/2019   Keratoconjunctivitis sicca of both eyes not specified as Sjogren's 03/04/2019   Posterior vitreous detachment of both eyes 03/04/2019   Presbyopia of both eyes 03/04/2019   Pseudophakia of both eyes 03/04/2019   Febrile illness 12/27/2018   Choledocholithiasis 12/27/2018   Low back pain 07/11/2017   Non compliance w medication regimen 08/23/2016   Peripheral neuropathy 08/23/2016   COPD (chronic obstructive pulmonary disease) (HCC) 04/19/2016   Hyperglycemia 02/10/2016   Preventative health care 07/29/2015   CAP (community acquired pneumonia) 05/30/2015   Abnormal CXR 05/30/2015   Cough 04/22/2015   Asthma with exacerbation 04/22/2015   Urine abnormality 04/22/2015   Abdominal pain 09/11/2014   Abnormal liver function test 09/11/2014   Dysphagia 07/29/2014   Anxiety state 09/06/2013   CKD (  chronic kidney disease) 09/06/2013   Hypothyroidism 09/06/2013   Rash and nonspecific skin eruption 02/27/2013   Dizziness and giddiness 01/04/2013   Abnormal TSH 06/04/2012   Gross hematuria 01/09/2012   Bruising 06/05/2011    Dementia (HCC) 09/01/2010   Cough variant asthma vs UACS  01/14/2010   ERECTILE DYSFUNCTION, ORGANIC 04/17/2009   FATIGUE 06/27/2008   PSA, INCREASED 06/27/2008   SCIATICA, RIGHT 04/28/2008   SCHATZKI'S RING 07/18/2007   Diaphragmatic hernia 07/18/2007   Schatzki's ring 07/18/2007   ABNORMAL ELECTROCARDIOGRAM 06/21/2007   Hyperlipidemia 03/23/2007   Essential hypertension 03/23/2007   Allergic rhinitis 03/23/2007   GERD 03/23/2007   BPH associated with nocturia 03/23/2007   NEPHROLITHIASIS, HX OF 03/23/2007    Orientation RESPIRATION BLADDER Height & Weight     Self, Time, Situation, Place  Normal Incontinent Weight:   Height:     BEHAVIORAL SYMPTOMS/MOOD NEUROLOGICAL BOWEL NUTRITION STATUS      Continent Diet (See Discharge Summary)  AMBULATORY STATUS COMMUNICATION OF NEEDS Skin   Total Care Verbally Normal                       Personal Care Assistance Level of Assistance  Bathing, Feeding, Dressing Bathing Assistance: Maximum assistance Feeding assistance: Limited assistance Dressing Assistance: Maximum assistance     Functional Limitations Info  Sight, Hearing, Speech Sight Info: Impaired (wears eye glasses) Hearing Info: Impaired (wears hearing aids) Speech Info: Adequate    SPECIAL CARE FACTORS FREQUENCY  PT (By licensed PT), OT (By licensed OT)     PT Frequency: 5 x per week OT Frequency: 5 x per week            Contractures Contractures Info: Not present    Additional Factors Info  Code Status, Allergies, Psychotropic Code Status Info: DNR Allergies Info: atorvastatin , doxazosin mesylate, hydrocodone  bit-homatrop, Hydrocodone  Psychotropic Info: aricept          Current Medications (02/06/2024):  This is the current hospital active medication list Current Facility-Administered Medications  Medication Dose Route Frequency Provider Last Rate Last Admin   acetaminophen  (TYLENOL ) tablet 650 mg  650 mg Oral Q6H PRN Alfornia Madison, MD        Or   acetaminophen  (TYLENOL ) suppository 650 mg  650 mg Rectal Q6H PRN Alfornia Madison, MD       albuterol  (PROVENTIL ) (2.5 MG/3ML) 0.083% nebulizer solution 2.5 mg  2.5 mg Nebulization Q4H PRN Alfornia Madison, MD       aspirin  EC tablet 81 mg  81 mg Oral Daily Rathore, Vasundhra, MD   81 mg at 02/06/24 1037   budesonide -glycopyrrolate -formoterol  (BREZTRI ) 160-9-4.8 MCG/ACT inhaler 2 puff  2 puff Inhalation BID Rathore, Vasundhra, MD   2 puff at 02/06/24 0914   clopidogrel  (PLAVIX ) tablet 75 mg  75 mg Oral Daily Rathore, Vasundhra, MD   75 mg at 02/06/24 1037   donepezil  (ARICEPT ) tablet 5 mg  5 mg Oral Daily Rathore, Vasundhra, MD   5 mg at 02/06/24 1037   fesoterodine (TOVIAZ) tablet 4 mg  4 mg Oral Daily Rathore, Vasundhra, MD   4 mg at 02/06/24 1044   finasteride  (PROSCAR ) tablet 5 mg  5 mg Oral Daily Rathore, Vasundhra, MD   5 mg at 02/06/24 1037   heparin injection 5,000 Units  5,000 Units Subcutaneous Q8H Alfornia Madison, MD   5,000 Units at 02/06/24 0520   pantoprazole  (PROTONIX ) EC tablet 40 mg  40 mg Oral Daily Rathore, Vasundhra, MD   40 mg at  02/06/24 1038   rosuvastatin  (CRESTOR ) tablet 10 mg  10 mg Oral Daily Rathore, Vasundhra, MD   10 mg at 02/06/24 1038   tamsulosin  (FLOMAX ) capsule 0.4 mg  0.4 mg Oral QHS Alfornia Madison, MD   0.4 mg at 02/05/24 2129     Discharge Medications: Please see discharge summary for a list of discharge medications.  Relevant Imaging Results:  Relevant Lab Results:   Additional Information SS#: [id number]  Rosaline JONELLE Joe, RN

## 2024-02-06 NOTE — Progress Notes (Signed)
 PROGRESS NOTE    Stephen Wilkins  FMW:[id number] DOB: 10/27/1927 DOA: 02/03/2024 PCP: Norleen Lynwood ORN, MD    Brief Narrative:  89 year old with advanced dementia, COPD, stroke, BPH, hypertension, hyperlipidemia, CKD stage IIIb, recent diagnosis of acute ischemic stroke presented from home for evaluation of syncope.  Patient lives at home with 24-hour support.  He was also wearing 30-day cardiac monitor as a stroke workup on discharge.  In the emergency room, blood pressure stable.  On room air.  EKG with sinus rhythm.  Cardiac monitor at home shows junctional rhythm at the time of syncope.  Admitted with cardiology consultation.  Subjective: Patient seen and examined.  No overnight events.  Remains occasionally tachycardic but mostly sinus rhythm.  Patient himself denies any complaints. He was trying to work with physical therapy, has significant difficulties with mobility.  Less reliable on the left leg. Caretaker at the bedside.  Had detailed discussion with patient's wife yesterday at the bedside.  Waiting for SNF bed.   Assessment & Plan:   Syncope with junctional rhythm: Currently stable.  Not on any rate control medications.  Seen by cardiology.   With advanced dementia, current stability patient is not a pacemaker candidate.  Continue supportive care.    History of stroke with residual left lower extremity weakness: On aspirin , Plavix  and Crestor .  No evidence of new neurological deficits.  Dementia with behavioral disturbances: Delirium precautions.  Continue Aricept .  Hypomagnesemia: Replaced.  Adequate.  CKD stage IIIb: At about baseline.  GERD: On PPI.  Goal of care: Discussed with patient's wife at the bedside 11/3.  Patient does have advanced directive for natural death.  CODE STATUS reflected to DNR/DNI.  She is agreeable per patient not to go through surgical intervention.  Patient lives at home with wife, he has caretakers but comes with multiple falls.  Currently  referring to a skilled nursing facility for rehab. May benefit with palliation and hospice in near future.    DVT prophylaxis: heparin injection 5,000 Units Start: 02/04/24 1400   Code Status: Full code Family Communication: None at the bedside Disposition Plan: Status is: Inpatient.  Pending placement.  Unsafe discharge.     Consultants:  Cardiology Palliative care  Procedures:  None  Antimicrobials:  None     Objective: Vitals:   02/06/24 0026 02/06/24 0520 02/06/24 0738 02/06/24 0914  BP: 139/80 (!) 141/100 135/66   Pulse: (!) 114 91 82 82  Resp:  20 20 18   Temp: 98 F (36.7 C) 98.4 F (36.9 C) 97.9 F (36.6 C)   TempSrc:  Oral    SpO2: 96% 98% 97% 98%    Intake/Output Summary (Last 24 hours) at 02/06/2024 1127 Last data filed at 02/06/2024 0538 Gross per 24 hour  Intake 436 ml  Output 350 ml  Net 86 ml   There were no vitals filed for this visit.  Examination:  General exam: Looks comfortable on approach.  Irritable on interview. Respiratory system: Clear to auscultation. Respiratory effort normal.  No added sounds. Cardiovascular system: S1 & S2 heard, RRR.  No pedal edema. Gastrointestinal system: Abdomen is nondistended, soft and nontender. No organomegaly or masses felt. Normal bowel sounds heard. Central nervous system: Alert and awake.  Oriented to himself. Weak on both legs.  Left ankle with internal rotation.  Left leg is weaker than right leg.  Inconsistent following instructions.    Data Reviewed: I have personally reviewed following labs and imaging studies  CBC: Recent Labs  Lab 02/03/24  2311 02/04/24 0538  WBC 8.8 9.4  HGB 8.7* 9.3*  HCT 27.4* 29.6*  MCV 98.6 98.0  PLT 271 274   Basic Metabolic Panel: Recent Labs  Lab 02/03/24 2311 02/04/24 0538 02/05/24 0156  NA 139 138 136  K 3.8 5.3* 5.0  CL 109 102 105  CO2 20* 25 23  GLUCOSE 115* 124* 112*  BUN 23 26* 25*  CREATININE 1.91* 2.35* 2.18*  CALCIUM  6.9* 8.6* 8.2*   MG 1.6* 2.0  --    GFR: CrCl cannot be calculated (Unknown ideal weight.). Liver Function Tests: Recent Labs  Lab 02/03/24 2311  AST 15  ALT 8  ALKPHOS 44  BILITOT 0.7  PROT 4.8*  ALBUMIN  2.1*   No results for input(s): LIPASE, AMYLASE in the last 168 hours. No results for input(s): AMMONIA in the last 168 hours. Coagulation Profile: Recent Labs  Lab 02/03/24 2343  INR 1.1   Cardiac Enzymes: No results for input(s): CKTOTAL, CKMB, CKMBINDEX, TROPONINI in the last 168 hours. BNP (last 3 results) No results for input(s): PROBNP in the last 8760 hours. HbA1C: No results for input(s): HGBA1C in the last 72 hours. CBG: Recent Labs  Lab 02/03/24 2313  GLUCAP 132*   Lipid Profile: No results for input(s): CHOL, HDL, LDLCALC, TRIG, CHOLHDL, LDLDIRECT in the last 72 hours. Thyroid  Function Tests: No results for input(s): TSH, T4TOTAL, FREET4, T3FREE, THYROIDAB in the last 72 hours. Anemia Panel: No results for input(s): VITAMINB12, FOLATE, FERRITIN, TIBC, IRON, RETICCTPCT in the last 72 hours. Sepsis Labs: No results for input(s): PROCALCITON, LATICACIDVEN in the last 168 hours.  No results found for this or any previous visit (from the past 240 hours).       Radiology Studies: No results found.       Scheduled Meds:  aspirin  EC  81 mg Oral Daily   budesonide -glycopyrrolate -formoterol   2 puff Inhalation BID   clopidogrel   75 mg Oral Daily   donepezil   5 mg Oral Daily   fesoterodine  4 mg Oral Daily   finasteride   5 mg Oral Daily   heparin  5,000 Units Subcutaneous Q8H   pantoprazole   40 mg Oral Daily   rosuvastatin   10 mg Oral Daily   tamsulosin   0.4 mg Oral QHS   Continuous Infusions:   LOS: 1 day    Time spent: 40 minutes    Renato Applebaum, MD [facility]

## 2024-02-06 NOTE — Evaluation (Signed)
 Physical Therapy Evaluation Patient Details Name: Stephen Wilkins MRN: [id number] DOB: 1927/06/14 Today's Date: 02/06/2024  History of Present Illness  Pt is a 89 y/o male presenting after syncopal episode at home. Cardiology following and recommend conservative mgmt. Recent admission 9/1-9/5 for acute CVA, syncope and AKI. PMH: hypertension, hyperlipidemia, GERD, dementia, COPD, anxiety, hypothyroidism, esophageal stricture, CKD, recent fall, GI bleed and thoracic aortic aneurysm.  Clinical Impression  Patient presents with decreased mobility due to weakness, decreased cognition, decreased balance and decreased activity tolerance.  Able to get to EOB with mod A then mod to max A to stand dependent on seat height and took a few shuffling steps, but eventually unable to tolerate weight on L foot with bruise on L toe so assisted to chair prior to return to supine.  Noted previously independent with mobility and some IADL's prior to recent stroke and rehab stay.  PT will continue to follow prior to d/c, recommend SNF level rehab at d/c.         If plan is discharge home, recommend the following: Two people to help with walking and/or transfers;A lot of help with bathing/dressing/bathroom;Assist for transportation;Assistance with cooking/housework;Supervision due to cognitive status   Can travel by private vehicle   No    Equipment Recommendations None recommended by PT  Recommendations for Other Services       Functional Status Assessment Patient has had a recent decline in their functional status and demonstrates the ability to make significant improvements in function in a reasonable and predictable amount of time.     Precautions / Restrictions Precautions Precautions: Fall Recall of Precautions/Restrictions: Impaired Precaution/Restrictions Comments: hx of syncope      Mobility  Bed Mobility   Bed Mobility: Supine to Sit, Sit to Supine     Supine to sit: Mod assist, HOB elevated,  Used rails Sit to supine: Mod assist   General bed mobility comments: cues for bringing legs to EOB, HHA to lift trunk and some help to scoot to EOB; to supine assist for legs onto bed with cues for down on side with rail; +2 A to scoot up and reposition in bed    Transfers Overall transfer level: Needs assistance Equipment used: Rolling walker (2 wheels) Transfers: Sit to/from Stand, Bed to chair/wheelchair/BSC Sit to Stand: Max assist, From elevated surface, Mod assist     Squat pivot transfers: Total assist     General transfer comment: max A  from chair without armrests beside bed (used bed rail to help lift) mod A from higher surface of bed to RW; unable to step to pivot to bed from chair at beside to pushed to sit on very edge of bed and time to reposition hips prior to return to supine    Ambulation/Gait Ambulation/Gait assistance: Max assist Gait Distance (Feet): 2 Feet Assistive device: Rolling walker (2 wheels) Gait Pattern/deviations: Step-to pattern, Decreased stride length, Shuffle, Decreased weight shift to left, Trunk flexed, Wide base of support       General Gait Details: able to scoot L foot forward, cues to attempt to move R foot though limited weight tolerance on L foot so minimal step; flexed over so assisted to sit in chair beside bed (brought by home health caregiver) due to pt unable to back up to bed  Stairs            Wheelchair Mobility     Tilt Bed    Modified Rankin (Stroke Patients Only)  Balance Overall balance assessment: Needs assistance   Sitting balance-Leahy Scale: Fair     Standing balance support: Bilateral upper extremity supported Standing balance-Leahy Scale: Poor                               Pertinent Vitals/Pain Pain Assessment Pain Assessment: Faces Faces Pain Scale: Hurts little more Pain Location: L second toe Pain Descriptors / Indicators: Sore, Tender Pain Intervention(s): Monitored during  session    Home Living Family/patient expects to be discharged to:: Private residence Living Arrangements: Spouse/significant other Available Help at Discharge: Family;Available 24 hours/day Type of Home: House Home Access: Stairs to enter Entrance Stairs-Rails: Doctor, General Practice of Steps: 2   Home Layout: One level Home Equipment: Agricultural Consultant (2 wheels);Grab bars - tub/shower;Rollator (4 wheels);Transport chair;Cane - single point;Shower seat (railing in hallway next to bed) Additional Comments: Home Instead 6-7 days/wk, 1 PM - 6 PM and also 6 PM -10 PM to help pt and pt spouse get ready for bed. Pt daughter lives 4 hours away, son lives 1.5 hours away. Pt spouse unable to help physically - on oxygen. Home setup from prior admission Sept 2025    Prior Function Prior Level of Function : Needs assist;Patient poor historian/Family not available               ADLs Comments: struggling with moblity since d/c from SNF after CVA; was mobile prior to stroke completing ADL's and taking out trash, helped with yardwork     Extremity/Trunk Assessment   Upper Extremity Assessment Upper Extremity Assessment: Defer to OT evaluation    Lower Extremity Assessment Lower Extremity Assessment: LLE deficits/detail LLE Deficits / Details: limited strength compared to R though at least 3/5 knee extension, 2/5 hip flexion; with noted bruise on L second toe and pt relates painful    Cervical / Trunk Assessment Cervical / Trunk Assessment: Normal  Communication   Communication Communication: Impaired Factors Affecting Communication: Hearing impaired    Cognition Arousal: Alert Behavior During Therapy: Flat affect   PT - Cognitive impairments: History of cognitive impairments                         Following commands: Impaired Following commands impaired: Follows one step commands with increased time, Follows one step commands inconsistently     Cueing  Cueing Techniques: Verbal cues, Tactile cues, Gestural cues     General Comments General comments (skin integrity, edema, etc.): home instead caregiver at the bedside had assisted with breakfast    Exercises     Assessment/Plan    PT Assessment Patient needs continued PT services  PT Problem List Decreased strength;Decreased activity tolerance;Decreased balance;Decreased mobility;Decreased safety awareness;Decreased knowledge of use of DME       PT Treatment Interventions DME instruction;Gait training;Patient/family education;Functional mobility training;Therapeutic activities;Therapeutic exercise;Balance training    PT Goals (Current goals can be found in the Care Plan section)  Acute Rehab PT Goals Patient Stated Goal: to go to rehab PT Goal Formulation: With patient/family Time For Goal Achievement: 02/20/24 Potential to Achieve Goals: Fair    Frequency Min 2X/week     Co-evaluation               AM-PAC PT 6 Clicks Mobility  Outcome Measure Help needed turning from your back to your side while in a flat bed without using bedrails?: A Lot Help needed moving from lying on  your back to sitting on the side of a flat bed without using bedrails?: Total Help needed moving to and from a bed to a chair (including a wheelchair)?: Total Help needed standing up from a chair using your arms (e.g., wheelchair or bedside chair)?: Total Help needed to walk in hospital room?: Total Help needed climbing 3-5 steps with a railing? : Total 6 Click Score: 7    End of Session Equipment Utilized During Treatment: Gait belt Activity Tolerance: Patient limited by pain Patient left: in bed;with call bell/phone within reach;with bed alarm set;with family/visitor present   PT Visit Diagnosis: Other abnormalities of gait and mobility (R26.89);History of falling (Z91.81);Other symptoms and signs involving the nervous system (R29.898)    Time: 1000-1035 PT Time Calculation (min) (ACUTE  ONLY): 35 min   Charges:   PT Evaluation $PT Eval Moderate Complexity: 1 Mod PT Treatments $Therapeutic Activity: 8-22 mins           Micheline Portal, PT Acute Rehabilitation Services Office:[phone] 02/06/2024   Montie Portal 02/06/2024, 1:11 PM

## 2024-02-06 NOTE — TOC Progression Note (Signed)
 Transition of Care [facility]) - Progression Note    Patient Details  Name: Stephen Wilkins MRN: [id number] Date of Birth: [birth year]  Transition of [facility]) CM/SW Contact  Rosaline JONELLE Joe, RN Phone Number: 02/06/2024, 3:15 PM  Clinical Narrative:    Patient was seen by PT today.  Fl2 was completed and patient was faxed out in the hub for bed offers.  Sherline, MSW was notified and will follow up with the patient's wife/daughter with available bed offers tomorrow.                     Expected Discharge Plan and Services                                               Social Drivers of Health (SDOH) Interventions SDOH Screenings   Food Insecurity: No Food Insecurity (02/04/2024)  Housing: Low Risk  (02/04/2024)  Transportation Needs: No Transportation Needs (02/04/2024)  Utilities: Not At Risk (02/04/2024)  Alcohol Screen: Low Risk  (10/02/2023)  Depression (PHQ2-9): Low Risk  (01/22/2024)  Financial Resource Strain: Low Risk  (10/02/2023)  Physical Activity: Inactive (10/02/2023)  Social Connections: Moderately Isolated (02/04/2024)  Stress: No Stress Concern Present (10/02/2023)  Tobacco Use: Medium Risk (02/03/2024)  Health Literacy: Adequate Health Literacy (10/02/2023)    Readmission Risk Interventions    11/13/2022   10:22 AM  Readmission Risk Prevention Plan  Transportation Screening Complete  PCP or Specialist Appt within 5-7 Days Complete  Home Care Screening Complete  Medication Review (RN CM) Complete

## 2024-02-06 NOTE — Plan of Care (Signed)

## 2024-02-06 NOTE — Consult Note (Signed)
 Palliative Care Consult Note                                  Date: 02/06/2024   Patient Name: Stephen Wilkins  DOB: [birth year]  MRN: [id number]  Age / Sex: 89 y.o., male  PCP: Norleen Lynwood ORN, MD Referring Physician: Raenelle Coria, MD  Reason for Consultation: Establishing goals of care  Past Medical History:  Diagnosis Date   ABNORMAL ELECTROCARDIOGRAM 06/21/2007   ALLERGIC RHINITIS 03/23/2007   Anxiety state, unspecified 09/06/2013   ASTHMATIC BRONCHITIS, ACUTE 04/27/2007   BENIGN PROSTATIC HYPERTROPHY 03/23/2007   BRONCHITIS NOT SPECIFIED AS ACUTE OR CHRONIC 04/21/2007   BURSITIS, RIGHT HIP 07/31/2008   CKD (chronic kidney disease)    COPD (chronic obstructive pulmonary disease) (HCC) 04/19/2016   Cough 01/29/2009   Dementia (HCC) 09/01/2010   ERECTILE DYSFUNCTION 03/23/2007   Esophageal stricture    Falls frequently    FREQUENCY, URINARY 04/26/2010   GERD 03/23/2007   HIATAL HERNIA    HYPERLIPIDEMIA 03/23/2007   HYPERTENSION 03/23/2007   MILD COGNITIVE IMPAIRMENT SO STATED 05/19/2010   NEPHROLITHIASIS, HX OF 03/23/2007   Nocturnal confusion 12/04/2023   Daughter states sun downs at night   PSA, INCREASED 06/27/2008   REACTIVE AIRWAY DISEASE 01/14/2010   SCHATZKI'S RING    SCIATICA, RIGHT 04/28/2008   Unspecified hypothyroidism 09/06/2013   Vascular dementia (HCC)     Subjective:   This NP Camellia Kays reviewed medical records, received report from team, assessed the patient and then meet at the patient's bedside to discuss diagnosis, prognosis, GOC, EOL wishes disposition and options.  Before meeting with the patient/family, I spent time reviewing the chart notes including admission H&P, cardiology consult note, internal medicine note, nurse note all from 02/04/2024; hospitalist note from yesterday, cardiology note from yesterday, additional hospice note from yesterday, TOC/case manager note from yesterday, nursing note  from yesterday. I also reviewed vital signs, nursing flowsheets, medication administrations record, labs, and imaging. Labs reviewed include BMP yesterday which shows stable/slightly improved creatinine which is 1.91 patient, increased to 2.35 yesterday, improved to 2.18 today in the setting of CKD 3B.  I met with the patient at the bedside, although he is unable to meaningfully communicate.  A caregiver from home was present at the bedside.  I called and spoke with his wife on the phone who requested that I call the patient's son.  I called and spoke with patient's son Stephen Wilkins on the phone.   We meet to discuss diagnosis prognosis, GOC, EOL wishes, disposition and options. Concept of Palliative Care was introduced as specialized medical care for people and their families living with serious illness.  If focuses on providing relief from the symptoms and stress of a serious illness.  The goal is to improve quality of life for both the patient and the family. Values and goals of care important to patient and family were attempted to be elicited.  Created space and opportunity for patient  and family to explore thoughts and feelings regarding current medical situation   Natural trajectory and current clinical status were discussed. Questions and concerns addressed. Patient  encouraged to call with questions or concerns.    Patient/Family Understanding of Illness: The patient's caregiver states that he seems a lot weaker currently, and recently has been more dependent on the bars installed in the home rather than walker.  She frequently has to use a gait belt  when working with him.    I called and spoke with the son he states that the patient tracks some things particularly well such as numbers and money, but other things are not as well.  He speaks often about past family.  Mental status beginning a little worse over the past 6 to 12 months and has previously had 13 strokes (possibly TIAs) in the past.    We spent time talking about chronic health, acute illness, and details.  Life Review: Deferred until family meeting  Baseline Status: Lives at home with his wife, has 24/7 caregivers, requires assistance for ambulation, some progressive worsening of mental status for the past 6 to 12 months.  Today's Discussion: In addition to discussions described above we had extensive discussion of various topics.  When I spoke with the patient's wife I explained the role of palliative medicine.  She states that she would prefer I speak with her son.  I called the patient's son and explained role of palliative medicine.  I discussed that we can talk through the patient's clinical status, different options, understanding what his life looks like down different paths to help them and making decisions.  We talked about setting limits, some of which we have already been set.  We confirmed DNR/DNI status.  We talked about the possible plan for discharge to SNF/rehab pending PT eval.  At this point family is agreeable to SNF/rehab if it is available.  We talked about limit-setting and regards to declining permanent pacemaker, which he confirms.  We talked about the progressive nature of dementia and that the patient likely has moderate to severe dementia.  We talked about the waxing and waning status of confusion associated with progressive dementia.  We discussed options including discharge to SNF/rehab, possibility of outpatient palliative care with the understanding that his health will likely decline over some amount of time.  Because of this I recommended outpatient palliative care and recommended that we talk about this when we are able to meet together.  The patient's son shares that he will reach out to his sister and his mother and try to figure out when they can meet.  He notes that tomorrow is not possible for him or his sister, but possibly the day after that.  I provided palliative medicine contact number  and request that he call with a date/time that works with him.  I provided emotional and general support through therapeutic listening, empathy, sharing of stories, and other techniques. I answered all questions and addressed all concerns to the best of my ability.  Goals: Confirmed DNR/DNI (DNR decimated), open to SNF/rehab, no permanent pacemaker, ongoing GOC conversations when family able to get together, possibly virtually.  Review of Systems  Unable to perform ROS Patient sleeping  Objective:   Primary Diagnoses: Present on Admission:  Syncope  Dementia (HCC)  COPD (chronic obstructive pulmonary disease) (HCC)   Vital Signs:  BP 135/66 (BP Location: Right Arm)   Pulse 82   Temp 97.9 F (36.6 C)   Resp 18   SpO2 98%   Physical Exam Vitals and nursing note reviewed.  Constitutional:      General: He is sleeping. He is not in acute distress. HENT:     Head: Normocephalic and atraumatic.  Cardiovascular:     Rate and Rhythm: Normal rate.  Pulmonary:     Effort: Pulmonary effort is normal. No respiratory distress.  Abdominal:     General: Abdomen is flat.  Palliative Assessment/Data: 40-50%   Advanced Care Planning:   Existing Vynca/ACP Documentation: None  Primary Decision Maker: NEXT OF KIN  Pertinent diagnosis: Syncope, junctional arrhythmia, dementia, CKD 3B  The patient and/or family consented to a voluntary Advance Care Planning Conversation over the phone. Individuals present for the conversation: Patient's spouse Stephen Wilkins via telephone; separately, patient son Stephen Wilkins via telephone; Camellia Kays, NP on both conversations.  Summary of the conversation: We discussed the role of palliative medicine, we talked about the patient's current clinical status, we talked about options moving forward, limits that have been set already, we talked about prognosis.  Outcome of the conversations and/or documents completed: DNR-limited, open to SNF/rehab,  agree to no pacemaker (risks outweigh the benefits), ongoing conversations about positive outpatient palliative care given likelihood of ongoing decline  I spent 30 minutes providing separately identifiable ACP services with the patient and/or surrogate decision maker in a voluntary, in-person conversation discussing the patient's wishes and goals as detailed in the above note.  Assessment & Plan:   HPI/Patient Profile: 89 y.o. male  with past medical history of advanced dementia, COPD, stroke, BPH, hypertension, hyperlipidemia, CKD stage IIIb, recent diagnosis of acute ischemic stroke presented from home for evaluation of syncope.  She was admitted on 02/03/2024 with syncope with junctional rhythm, history of CVA with residual left lower extremity weakness, dementia with behavioral disturbances, CKD 3B, and others.   SUMMARY OF RECOMMENDATIONS   DNR-Limited Continue current scope of care Ongoing conversations with family about big picture Recommended outpatient palliative care, will make that decision after further conversations Palliative medicine will continue to follow  Symptom Management:  Per primary team Palliative medicine is available to assist as needed  Code Status: DNR - Limited (DNR/DNI)  Prognosis:  Unable to determine  Discharge Planning:  SNF/Rehab   Discussed with: Patient's family, medical team, nursing team, [facility] team    Thank you for allowing us  to participate in the care of Stephen Wilkins PMT will continue to support holistically.  Billing based on MDM: High  Problems Addressed: One or more chronic illnesses with severe exacerbation, progression, or side effects of treatment.  Amount and/or Complexity of Data: Category 1:Review of prior external note(s) from each unique source, Review of the result(s) of each unique test, and Assessment requiring an independent historian(s) and Category 3:Discussion of management or test interpretation with external  physician/other qualified health care professional/appropriate source (not separately reported)  Risks: N/A  Detailed review of medical records (labs, imaging, vital signs), medically appropriate exam, discussed with treatment team, counseling and education to patient, family, & staff, documenting clinical information, medication management, coordination of care  Signed by: Camellia Kays, NP Palliative Medicine Team  Team Phone # [phone] (Nights/Weekends)  02/06/2024, 11:09 AM

## 2024-02-07 DIAGNOSIS — R55 Syncope and collapse: Secondary | ICD-10-CM | POA: Diagnosis not present

## 2024-02-07 DIAGNOSIS — Z515 Encounter for palliative care: Secondary | ICD-10-CM

## 2024-02-07 DIAGNOSIS — J411 Mucopurulent chronic bronchitis: Secondary | ICD-10-CM | POA: Diagnosis not present

## 2024-02-07 DIAGNOSIS — F01B4 Vascular dementia, moderate, with anxiety: Secondary | ICD-10-CM | POA: Diagnosis not present

## 2024-02-07 DIAGNOSIS — D649 Anemia, unspecified: Secondary | ICD-10-CM | POA: Diagnosis not present

## 2024-02-07 NOTE — Plan of Care (Signed)

## 2024-02-07 NOTE — Plan of Care (Signed)
     Referral previously received for Stephen Wilkins for goals of care discussion. Noted most recent palliative in-person assessment dated 02/06/2024 at which time it was recommended to await callback from son on date/time for family meeting.  Chart reviewed for Recent provider notes, nurse notes, TOC notes, and vitals and updates received from RN.   At this time patient appears stable, awaiting bed offers for SNF/rehab.   I received a call from the patient's son with request to meet tomorrow at 9 AM via Zoom call.  The meeting will be with the patient's son, patient's wife, and patient's daughter.  I initiated a Zoom meeting invitation to be sent to the patient's family.  Follow-up phone call later in today, Zoom meeting invite not received and I resent.  I informed patient's son that if for some reason Zoom is not functional tomorrow then we would proceed via phone call.  No plan for in person follow-up today.  Will plan for family meeting tomorrow at 9:00 AM.  Thank you for your referral and allowing PMT to assist in Stephen Wilkins's care.   Camellia Kays, NP Palliative Medicine Team Phone: [phone]  NO CHARGE

## 2024-02-07 NOTE — Progress Notes (Signed)
 Progress Note   Patient: Stephen Wilkins FMW:[id number] DOB: Oct 02, 1927 DOA: 02/03/2024     2 DOS: the patient was seen and examined on 02/07/2024   Brief hospital course: 89 year old with advanced dementia, COPD, stroke, BPH, hypertension, hyperlipidemia, CKD stage IIIb, recent diagnosis of acute ischemic stroke presented from home for evaluation of syncope. Patient lives at home with 24-hour support. He was also wearing 30-day cardiac monitor as a stroke workup on discharge. In the emergency room, blood pressure stable. On room air. EKG with sinus rhythm. Cardiac monitor at home shows junctional rhythm at the time of syncope. Admitted with cardiology consultation, who did not advised pacemaker given advanced dementia. Palliative care on board for on going goals of care discussions.  Assessment and Plan: Syncope with junctional rhythm: Currently stable.  Not on any rate control medications.  Cardiology advised with advanced dementia, he is not a pacemaker candidate.  Continue supportive care.     History of stroke with residual left lower extremity weakness: On aspirin , Plavix  and Crestor .  No evidence of new neurological deficits.   Dementia with behavioral disturbances: Delirium precautions.  Continue Aricept . Delirium precautions.   Hypomagnesemia: Replaced.   CKD stage IIIb: At baseline. Avoid nephrotoxic drugs.   GERD: On PPI.   Goal of care: Discussed with patient's wife at the bedside 11/3.  Patient does have advanced directive for natural death.  CODE STATUS DNR/DNI.  No aggressive interventions per family.  Patient lives at home with wife, he has caretakers but comes with multiple falls.  PT/ OT advised skilled nursing facility for rehab. Palliative to discuss goals of care with family tomorrow.     Out of bed to chair. Incentive spirometry. Nursing supportive care. Fall, aspiration precautions. Diet:  Diet Orders (From admission, onward)     Start     Ordered   02/04/24 0441   Diet Heart Room service appropriate? Yes; Fluid consistency: Thin  Diet effective now       Question Answer Comment  Room service appropriate? Yes   Fluid consistency: Thin      02/04/24 0441           DVT prophylaxis: heparin injection 5,000 Units Start: 02/04/24 1400  Level of care: Telemetry   Code Status: Limited: Do not attempt resuscitation (DNR) -DNR-LIMITED -Do Not Intubate/DNI   Subjective: Patient is seen and examined today morning.  He is lying comfortably, confused but able to answer me.  Caretaker at bedside.  He wishes to go home.  Physical Exam: Vitals:   02/07/24 0522 02/07/24 0750 02/07/24 0758 02/07/24 1152  BP: (!) 140/75  123/60 (!) 145/79  Pulse: 87  90 90  Resp:   18 18  Temp: 98.1 F (36.7 C)  97.7 F (36.5 C) 97.8 F (36.6 C)  TempSrc: Oral     SpO2: 98% 97% 98% 98%    General - Elderly Caucasian male, confused HEENT - PERRLA, EOMI, atraumatic head, non tender sinuses. Lung - Clear, no rales, rhonchi, wheezes. Heart - S1, S2 heard, no murmurs, rubs, no pedal edema. Abdomen - Soft, non tender, bowel sounds good Neuro - Alert, awake and confused, able to follow commands, non focal exam. Skin - Warm and dry.  Data Reviewed:      Latest Ref Rng & Units 02/04/2024    5:38 AM 02/03/2024   11:11 PM 01/20/2024    3:03 AM  CBC  WBC 4.0 - 10.5 K/uL 9.4  8.8  10.0   Hemoglobin 13.0 -  17.0 g/dL 9.3  8.7  9.2   Hematocrit 39.0 - 52.0 % 29.6  27.4  29.7   Platelets 150 - 400 K/uL 274  271  290       Latest Ref Rng & Units 02/05/2024    1:56 AM 02/04/2024    5:38 AM 02/03/2024   11:11 PM  BMP  Glucose 70 - 99 mg/dL 887  875  884   BUN 8 - 23 mg/dL 25  26  23    Creatinine 0.61 - 1.24 mg/dL 7.81  7.64  8.08   Sodium 135 - 145 mmol/L 136  138  139   Potassium 3.5 - 5.1 mmol/L 5.0  5.3  3.8   Chloride 98 - 111 mmol/L 105  102  109   CO2 22 - 32 mmol/L 23  25  20    Calcium  8.9 - 10.3 mg/dL 8.2  8.6  6.9    No results found.  Family  Communication: Discussed with patient's caretaker at bedside, understand and agree. All questions answered.  Disposition: Status is: Inpatient Remains inpatient appropriate because: Pending placement, palliative discussion  Planned Discharge Destination: Skilled nursing facility     Time spent: 42 minutes  Author: Concepcion Riser, MD 02/07/2024 3:27 PM Secure chat 7am to 7pm For on call review [email or web address].

## 2024-02-07 NOTE — Plan of Care (Signed)

## 2024-02-08 DIAGNOSIS — D649 Anemia, unspecified: Secondary | ICD-10-CM | POA: Diagnosis not present

## 2024-02-08 DIAGNOSIS — J411 Mucopurulent chronic bronchitis: Secondary | ICD-10-CM | POA: Diagnosis not present

## 2024-02-08 DIAGNOSIS — R55 Syncope and collapse: Secondary | ICD-10-CM | POA: Diagnosis not present

## 2024-02-08 DIAGNOSIS — F01B4 Vascular dementia, moderate, with anxiety: Secondary | ICD-10-CM | POA: Diagnosis not present

## 2024-02-08 LAB — CBC
HCT: 28.1 % — ABNORMAL LOW (ref 39.0–52.0)
Hemoglobin: 9 g/dL — ABNORMAL LOW (ref 13.0–17.0)
MCH: 30.5 pg (ref 26.0–34.0)
MCHC: 32 g/dL (ref 30.0–36.0)
MCV: 95.3 fL (ref 80.0–100.0)
Platelets: 279 K/uL (ref 150–400)
RBC: 2.95 MIL/uL — ABNORMAL LOW (ref 4.22–5.81)
RDW: 12.6 % (ref 11.5–15.5)
WBC: 10.1 K/uL (ref 4.0–10.5)
nRBC: 0 % (ref 0.0–0.2)

## 2024-02-08 LAB — BASIC METABOLIC PANEL WITH GFR
Anion gap: 12 (ref 5–15)
BUN: 46 mg/dL — ABNORMAL HIGH (ref 8–23)
CO2: 23 mmol/L (ref 22–32)
Calcium: 8.6 mg/dL — ABNORMAL LOW (ref 8.9–10.3)
Chloride: 102 mmol/L (ref 98–111)
Creatinine, Ser: 2.42 mg/dL — ABNORMAL HIGH (ref 0.61–1.24)
GFR, Estimated: 24 mL/min — ABNORMAL LOW (ref >60)
Glucose, Bld: 126 mg/dL — ABNORMAL HIGH (ref 70–99)
Potassium: 5.1 mmol/L (ref 3.5–5.1)
Sodium: 137 mmol/L (ref 135–145)

## 2024-02-08 NOTE — Plan of Care (Signed)

## 2024-02-08 NOTE — Progress Notes (Signed)
 Physical Therapy Treatment Patient Details Name: Stephen Wilkins MRN: [id number] DOB: Aug 22, 1927 Today's Date: 02/08/2024   History of Present Illness Pt is a 89 y/o male presenting after syncopal episode at home. Cardiology following and recommend conservative mgmt. Recent admission 9/1-9/5 for acute CVA, syncope and AKI. PMH: hypertension, hyperlipidemia, GERD, dementia, COPD, anxiety, hypothyroidism, esophageal stricture, CKD, recent fall, GI bleed and thoracic aortic aneurysm.    PT Comments  Pt is participative and able to follow simple commands with cues.  Slow to progress toward goals.  Emphasis on transitions, scooting, STS x4 with pre-gait activity progressing to limited, assisted gait with 2 persons max assist    If plan is discharge home, recommend the following: Two people to help with walking and/or transfers;A lot of help with bathing/dressing/bathroom;Assist for transportation;Assistance with cooking/housework;Supervision due to cognitive status   Can travel by private vehicle     No  Equipment Recommendations  None recommended by PT    Recommendations for Other Services       Precautions / Restrictions Precautions Precautions: Fall Recall of Precautions/Restrictions: Impaired Precaution/Restrictions Comments: hx of syncope     Mobility  Bed Mobility Overal bed mobility: Needs Assistance Bed Mobility: Supine to Sit, Sit to Supine     Supine to sit: Mod assist, Used rails Sit to supine: Mod assist   General bed mobility comments: pt followed the simple cues/commands well and initiated getting OOB, but still needed light mod assist to come up via R elbow to upright.  Also mod assist and cues to build some momentum to scoot with rocking    Transfers Overall transfer level: Needs assistance Equipment used: Rolling walker (2 wheels) Transfers: Sit to/from Stand Sit to Stand: Mod assist, +2 physical assistance (or heavier mod  +1  x4 trials of STS)            General transfer comment: cues for hand placement and assist forward and for boost.    Ambulation/Gait Ambulation/Gait assistance: Max assist, +2 safety/equipment Gait Distance (Feet): 4 Feet (forward and back and 3 feet x2 forward with sitting rests in between trials) Assistive device: Rolling walker (2 wheels) Gait Pattern/deviations: Step-to pattern, Decreased step length - right, Decreased step length - left, Decreased stance time - right, Decreased stride length       General Gait Details: pt needed w/shift assist R and stability for w/bearing and unweighting L LE to advance gait.   Stairs             Wheelchair Mobility     Tilt Bed    Modified Rankin (Stroke Patients Only)       Balance Overall balance assessment: Needs assistance Sitting-balance support: No upper extremity supported, Feet supported Sitting balance-Leahy Scale: Fair       Standing balance-Leahy Scale: Poor Standing balance comment: pt needed support of RW and external support during pre-gait and gait activity.                            Communication Communication Communication: Impaired Factors Affecting Communication: Hearing impaired  Cognition Arousal: Alert Behavior During Therapy: Flat affect, WFL for tasks assessed/performed   PT - Cognitive impairments: History of cognitive impairments                         Following commands: Impaired Following commands impaired: Follows one step commands with increased time, Follows one step commands inconsistently  Cueing Cueing Techniques: Verbal cues, Tactile cues, Gestural cues  Exercises Other Exercises Other Exercises: warm up hip/knee flex/ext bil with graded resistance x5    General Comments        Pertinent Vitals/Pain Pain Assessment Pain Assessment: Faces Faces Pain Scale: Hurts little more Pain Location: L second toe Pain Descriptors / Indicators: Sore, Tender Pain Intervention(s): Monitored  during session, Limited activity within patient's tolerance    Home Living                          Prior Function            PT Goals (current goals can now be found in the care plan section) Acute Rehab PT Goals Patient Stated Goal: to go to rehab PT Goal Formulation: With patient/family Time For Goal Achievement: 02/20/24 Potential to Achieve Goals: Fair Progress towards PT goals: Progressing toward goals    Frequency    Min 2X/week      PT Plan      Co-evaluation              AM-PAC PT 6 Clicks Mobility   Outcome Measure  Help needed turning from your back to your side while in a flat bed without using bedrails?: A Lot Help needed moving from lying on your back to sitting on the side of a flat bed without using bedrails?: A Lot Help needed moving to and from a bed to a chair (including a wheelchair)?: Total Help needed standing up from a chair using your arms (e.g., wheelchair or bedside chair)?: A Lot Help needed to walk in hospital room?: Total Help needed climbing 3-5 steps with a railing? : Total 6 Click Score: 9    End of Session   Activity Tolerance: Patient limited by pain Patient left: in bed;with call bell/phone within reach;with bed alarm set;with family/visitor present Nurse Communication: Mobility status PT Visit Diagnosis: Other abnormalities of gait and mobility (R26.89);History of falling (Z91.81);Other symptoms and signs involving the nervous system (R29.898)     Time: 8845-8781 PT Time Calculation (min) (ACUTE ONLY): 24 min  Charges:    $Therapeutic Activity: 8-22 mins $Neuromuscular Re-education: 8-22 mins PT General Charges $$ ACUTE PT VISIT: 1 Visit                     02/08/2024  India HERO., PT Acute Rehabilitation Services [phone]  (office)   Stephen Wilkins 02/08/2024, 5:44 PM

## 2024-02-08 NOTE — Progress Notes (Signed)
 Progress Note   Patient: Stephen Wilkins FMW:[id number] DOB: [birth year] DOA: 02/03/2024     3 DOS: the patient was seen and examined on 02/08/2024   Brief hospital course: 89 year old with advanced dementia, COPD, stroke, BPH, hypertension, hyperlipidemia, CKD stage IIIb, recent diagnosis of acute ischemic stroke presented from home for evaluation of syncope. Patient lives at home with 24-hour support. He was also wearing 30-day cardiac monitor as a stroke workup on discharge. In the emergency room, blood pressure stable. On room air. EKG with sinus rhythm. Cardiac monitor at home shows junctional rhythm at the time of syncope. Admitted with cardiology consultation, who did not advised pacemaker given advanced dementia. Palliative care on board for on going goals of care discussions.  Assessment and Plan: Syncope with junctional rhythm: Currently stable.  Not on any rate control medications.  Cardiology advised with advanced dementia, he is not a pacemaker candidate.  Continue supportive care.     History of stroke with residual left lower extremity weakness: On aspirin , Plavix  and Crestor .  No evidence of new neurological deficits.   Dementia with behavioral disturbances: Delirium precautions.  Continue Aricept . Delirium precautions.   Hypomagnesemia: Replaced.   CKD stage IIIb: At baseline. Avoid nephrotoxic drugs.   GERD: On PPI.   Goal of care: Patient does have advanced directive for natural death.  CODE STATUS DNR/DNI.  No aggressive interventions per family.   PT/ OT advised skilled nursing facility for rehab. Palliative team discussed goals of care with family, agreed for outpatient palliative follow up.     Out of bed to chair. Incentive spirometry. Nursing supportive care. Fall, aspiration precautions. Diet:  Diet Orders (From admission, onward)     Start     Ordered   02/04/24 0441  Diet Heart Room service appropriate? Yes; Fluid consistency: Thin  Diet effective now        Question Answer Comment  Room service appropriate? Yes   Fluid consistency: Thin      02/04/24 0441           DVT prophylaxis: heparin injection 5,000 Units Start: 02/04/24 1400  Level of care: Telemetry   Code Status: Limited: Do not attempt resuscitation (DNR) -DNR-LIMITED -Do Not Intubate/DNI   Subjective: Patient is seen and examined today morning.  He is lying comfortably, no complaints.  Caretaker at bedside.   Physical Exam: Vitals:   02/08/24 0003 02/08/24 0513 02/08/24 0801 02/08/24 0818  BP: 122/69 (!) 142/67 136/62   Pulse: (!) 104 91 85 84  Resp:   18 18  Temp: 97.7 F (36.5 C) (!) 97.5 F (36.4 C) 98 F (36.7 C)   TempSrc:      SpO2: 97% 99% 99%     General - Elderly Caucasian male, confused HEENT - PERRLA, EOMI, atraumatic head, non tender sinuses. Lung - Clear, no rales, rhonchi, wheezes. Heart - S1, S2 heard, no murmurs, rubs, no pedal edema. Abdomen - Soft, non tender, bowel sounds good Neuro - Alert, awake and confused, able to follow commands, non focal exam. Skin - Warm and dry.  Data Reviewed:      Latest Ref Rng & Units 02/08/2024    1:48 AM 02/04/2024    5:38 AM 02/03/2024   11:11 PM  CBC  WBC 4.0 - 10.5 K/uL 10.1  9.4  8.8   Hemoglobin 13.0 - 17.0 g/dL 9.0  9.3  8.7   Hematocrit 39.0 - 52.0 % 28.1  29.6  27.4   Platelets 150 - 400 K/uL  279  274  271       Latest Ref Rng & Units 02/08/2024    1:48 AM 02/05/2024    1:56 AM 02/04/2024    5:38 AM  BMP  Glucose 70 - 99 mg/dL 873  887  875   BUN 8 - 23 mg/dL 46  25  26   Creatinine 0.61 - 1.24 mg/dL 7.57  7.81  7.64   Sodium 135 - 145 mmol/L 137  136  138   Potassium 3.5 - 5.1 mmol/L 5.1  5.0  5.3   Chloride 98 - 111 mmol/L 102  105  102   CO2 22 - 32 mmol/L 23  23  25    Calcium  8.9 - 10.3 mg/dL 8.6  8.2  8.6    No results found.  Family Communication: Discussed with patient's caretaker at bedside, wife yesterday at bedside. They understand and agree. All questions  answered.  Disposition: Status is: Inpatient Remains inpatient appropriate because: Pending placement, palliative discussion  Planned Discharge Destination: Skilled nursing facility     Time spent: 42 minutes  Author: Concepcion Riser, MD 02/08/2024 1:00 PM Secure chat 7am to 7pm For on call review [email or web address].

## 2024-02-08 NOTE — Progress Notes (Signed)
 Daily Progress Note   Date: 02/08/2024   Patient Name: Stephen Wilkins  DOB: 06/13/1927  MRN: [id number]  Age / Sex: 89 y.o., male  Attending Physician: Darci Pore, MD Primary Care Physician: Norleen Lynwood ORN, MD Admit Date: 02/03/2024 Length of Stay: 3 days  Reason for Follow-up: Establishing goals of care  Past Medical History:  Diagnosis Date   ABNORMAL ELECTROCARDIOGRAM 06/21/2007   ALLERGIC RHINITIS 03/23/2007   Anxiety state, unspecified 09/06/2013   ASTHMATIC BRONCHITIS, ACUTE 04/27/2007   BENIGN PROSTATIC HYPERTROPHY 03/23/2007   BRONCHITIS NOT SPECIFIED AS ACUTE OR CHRONIC 04/21/2007   BURSITIS, RIGHT HIP 07/31/2008   CKD (chronic kidney disease)    COPD (chronic obstructive pulmonary disease) (HCC) 04/19/2016   Cough 01/29/2009   Dementia (HCC) 09/01/2010   ERECTILE DYSFUNCTION 03/23/2007   Esophageal stricture    Falls frequently    FREQUENCY, URINARY 04/26/2010   GERD 03/23/2007   HIATAL HERNIA    HYPERLIPIDEMIA 03/23/2007   HYPERTENSION 03/23/2007   MILD COGNITIVE IMPAIRMENT SO STATED 05/19/2010   NEPHROLITHIASIS, HX OF 03/23/2007   Nocturnal confusion 12/04/2023   Daughter states sun downs at night   PSA, INCREASED 06/27/2008   REACTIVE AIRWAY DISEASE 01/14/2010   SCHATZKI'S RING    SCIATICA, RIGHT 04/28/2008   Unspecified hypothyroidism 09/06/2013   Vascular dementia (HCC)     Subjective:   Subjective: Chart Reviewed. Updates received. Patient Assessed. Created space and opportunity for patient  and family to explore thoughts and feelings regarding current medical situation.  Today's Discussion: Today before meeting with the patient/family, I reviewed the chart notes including internal medicine note from yesterday, nursing notes from yesterday.  After seeing the patient I reviewed internal medicine note from today, PT note from today, nursing note from today. I also reviewed vital signs, nursing flowsheets, medication administrations record,  labs, and imaging. Labs reviewed include CBC which shows mild rising white blood cell count over the past several days from 8.8 to 9.4 to 10.1 today, unknown significance.  BMP shows mild bump in creatinine from 2.18 four days ago to 2.42 today in the setting of CKD 3, remained stable and within baseline.  Today saw the patient at the bedside, no family was present.  However, his personal caregiver was present.  The patient was awake sitting up in bed, Muay Thai intact and greeted me warmly.  He knows his name and the year, and is unsure where he is out or why he is here.  We discussed that he is at the hospital and he came in because he passed out from a heart arrhythmia.  He denies pain, nausea, vomiting, dyspnea.  His caregiver shares that she is about to order him a couple coffee which she seems happy about.  I shared I would speak with his family in a little bit and tell him that I saw him.  Personal caregiver also provided information about how the patient has been doing over the past 24 hours.  After that I met with the family virtually on a Zoom call for scheduled family meeting.  We spent a significant amount of time detailing his clinical history, acute presentation, and what the future likely looks like.  They understand he has a heart arrhythmia and is not a candidate for pacemaker.  We discussed that his dementia will continue to progress, possibility for rapid decline during times of acute illness.  They understand that he may have future syncopal episodes related to his heart arrhythmia.  They understand that  he is likely, in the future, that his overall health will continue to decline.  We spent time talk about disposition options.  They are open and agreeable to discharge to SNF/rehab, pending bed offers.  We discussed the recommendation for outpatient palliative care to continue to have goals of care conversations as his clinical picture evolves.  They are in agreement and have selected the  [facility] [place] for [facility] palliative care/care connections.  I provided emotional and general support through therapeutic listening, empathy, sharing of stories, and other techniques. I answered all questions and addressed all concerns to the best of my ability.  Review of Systems  Constitutional:        Denies pain in general  Respiratory:  Negative for shortness of breath.   Cardiovascular:  Negative for chest pain.  Gastrointestinal:  Negative for abdominal pain, nausea and vomiting.    Objective:   Primary Diagnoses: Present on Admission:  Syncope  Dementia (HCC)  COPD (chronic obstructive pulmonary disease) (HCC)   Vital Signs:  BP 136/62 (BP Location: Left Arm)   Pulse 84   Temp 98 F (36.7 C)   Resp 18   SpO2 99%   Physical Exam Vitals and nursing note reviewed.  Constitutional:      General: He is not in acute distress.    Appearance: He is ill-appearing.  HENT:     Head: Normocephalic and atraumatic.  Pulmonary:     Effort: Pulmonary effort is normal. No respiratory distress.  Abdominal:     General: Abdomen is flat.  Skin:    General: Skin is warm and dry.  Neurological:     Mental Status: He is alert. He is disoriented.     Comments: Oriented to person and time; disoriented to place and situation  Psychiatric:        Mood and Affect: Mood normal.        Behavior: Behavior normal.     Palliative Assessment/Data: 60%   Existing Vynca/ACP Documentation: None  Assessment & Plan:   HPI/Patient Profile:  89 y.o. male  with past medical history of advanced dementia, COPD, stroke, BPH, hypertension, hyperlipidemia, CKD stage IIIb, recent diagnosis of acute ischemic stroke presented from home for evaluation of syncope.  She was admitted on 02/03/2024 with syncope with junctional rhythm, history of CVA with residual left lower extremity weakness, dementia with behavioral disturbances, CKD 3B, and others.   SUMMARY OF RECOMMENDATIONS    DNR-Limited Continue current scope of care Agreeable to SNF/rehab disposition Agreeable to outpatient palliative care for ongoing GOC conversations [facility] consult placed for outpatient palliative care referral Palliative medicine will continue to follow  Symptom Management:  Per primary team Palliative medicine is available to assist as needed  Code Status: DNR - Limited (DNR/DNI)  Prognosis: Unable to determine  Discharge Planning: Skilled Nursing Facility for rehab with Palliative care service follow-up  Discussed with: Patient, family, medical team, nursing team, [facility] team  Thank you for allowing us  to participate in the care of THEOPHILE HARVIE PMT will continue to support holistically.  Billing based on MDM: High  Problems Addressed: One or more chronic illnesses with severe exacerbation, progression, or side effects of treatment.  Amount and/or Complexity of Data: Category 1:Review of prior external note(s) from each unique source, Review of the result(s) of each unique test, and Assessment requiring an independent historian(s) and Category 3:Discussion of management or test interpretation with external physician/other qualified health care professional/appropriate source (not separately reported)  Risks:  N/A  Detailed review of medical records (labs, imaging, vital signs), medically appropriate exam, discussed with treatment team, counseling and education to patient, family, & staff, documenting clinical information, medication management, coordination of care  Camellia Kays, NP Palliative Medicine Team  Team Phone # [phone] (Nights/Weekends)  12/01/2020, 8:17 AM

## 2024-02-09 DIAGNOSIS — D649 Anemia, unspecified: Secondary | ICD-10-CM | POA: Diagnosis not present

## 2024-02-09 DIAGNOSIS — Z515 Encounter for palliative care: Secondary | ICD-10-CM | POA: Diagnosis not present

## 2024-02-09 DIAGNOSIS — R55 Syncope and collapse: Secondary | ICD-10-CM | POA: Diagnosis not present

## 2024-02-09 DIAGNOSIS — F01B4 Vascular dementia, moderate, with anxiety: Secondary | ICD-10-CM | POA: Diagnosis not present

## 2024-02-09 LAB — BASIC METABOLIC PANEL WITH GFR
Anion gap: 12 (ref 5–15)
BUN: 53 mg/dL — ABNORMAL HIGH (ref 8–23)
CO2: 22 mmol/L (ref 22–32)
Calcium: 8.7 mg/dL — ABNORMAL LOW (ref 8.9–10.3)
Chloride: 101 mmol/L (ref 98–111)
Creatinine, Ser: 2.44 mg/dL — ABNORMAL HIGH (ref 0.61–1.24)
GFR, Estimated: 24 mL/min — ABNORMAL LOW (ref >60)
Glucose, Bld: 104 mg/dL — ABNORMAL HIGH (ref 70–99)
Potassium: 5.5 mmol/L — ABNORMAL HIGH (ref 3.5–5.1)
Sodium: 135 mmol/L (ref 135–145)

## 2024-02-09 LAB — CBC
HCT: 30.7 % — ABNORMAL LOW (ref 39.0–52.0)
Hemoglobin: 9.8 g/dL — ABNORMAL LOW (ref 13.0–17.0)
MCH: 30.7 pg (ref 26.0–34.0)
MCHC: 31.9 g/dL (ref 30.0–36.0)
MCV: 96.2 fL (ref 80.0–100.0)
Platelets: 327 K/uL (ref 150–400)
RBC: 3.19 MIL/uL — ABNORMAL LOW (ref 4.22–5.81)
RDW: 12.4 % (ref 11.5–15.5)
WBC: 10.8 K/uL — ABNORMAL HIGH (ref 4.0–10.5)
nRBC: 0 % (ref 0.0–0.2)

## 2024-02-09 NOTE — Progress Notes (Signed)
 Progress Note   Patient: Stephen Wilkins FMW:[id number] DOB: [birth year] DOA: 02/03/2024     4 DOS: the patient was seen and examined on 02/09/2024   Brief hospital course: 89 year old with advanced dementia, COPD, stroke, BPH, hypertension, hyperlipidemia, CKD stage IIIb, recent diagnosis of acute ischemic stroke presented from home for evaluation of syncope. Patient lives at home with 24-hour support. He was also wearing 30-day cardiac monitor as a stroke workup on discharge. In the emergency room, blood pressure stable. On room air. EKG with sinus rhythm. Cardiac monitor at home shows junctional rhythm at the time of syncope. Admitted with cardiology consultation, who did not advised pacemaker given advanced dementia. Palliative care discussed with family agreed for outpatient palliative follow up.  Assessment and Plan: Syncope with junctional rhythm: Currently stable.  Not on any rate control medications.  Cardiology advised with advanced dementia, he is not a pacemaker candidate.  Continue supportive care.     History of stroke with residual left lower extremity weakness: On aspirin , Plavix  and Crestor .  No evidence of new neurological deficits.   Dementia with behavioral disturbances: Delirium precautions.  Continue Aricept . Delirium precautions.   Hypomagnesemia: Replaced.   CKD stage IIIb: At baseline. Avoid nephrotoxic drugs. K high will continue to trend.   GERD: On PPI.   Goal of care: Patient does have advanced directive for natural death.  CODE STATUS DNR/DNI.  No aggressive interventions per family.   PT/ OT advised skilled nursing facility for rehab. Palliative team discussed goals of care with family, agreed for outpatient palliative follow up.     Out of bed to chair. Incentive spirometry. Nursing supportive care. Fall, aspiration precautions. Diet:  Diet Orders (From admission, onward)     Start     Ordered   02/04/24 0441  Diet Heart Room service appropriate? Yes;  Fluid consistency: Thin  Diet effective now       Question Answer Comment  Room service appropriate? Yes   Fluid consistency: Thin      02/04/24 0441           DVT prophylaxis: heparin injection 5,000 Units Start: 02/04/24 1400  Level of care: Telemetry   Code Status: Limited: Do not attempt resuscitation (DNR) -DNR-LIMITED -Do Not Intubate/DNI   Subjective: Patient is seen and examined today morning.  He is lying comfortably, no complaints. Wife and caretaker at bedside.   Physical Exam: Vitals:   02/08/24 2156 02/09/24 0609 02/09/24 0754 02/09/24 1234  BP: 130/73 128/66  130/64  Pulse: 100 95  93  Resp: 18 19    Temp: 98.3 F (36.8 C) 97.9 F (36.6 C)  98.1 F (36.7 C)  TempSrc:      SpO2:  96% 98% 94%    General - Elderly Caucasian male, no acute distress HEENT - PERRLA, EOMI, atraumatic head, non tender sinuses. Lung - Clear, no rales, rhonchi, wheezes. Heart - S1, S2 heard, no murmurs, rubs, no pedal edema. Abdomen - Soft, non tender, bowel sounds good Neuro - Alert, awake and confused, able to follow commands, non focal exam. Skin - Warm and dry.  Data Reviewed:      Latest Ref Rng & Units 02/09/2024    5:08 AM 02/08/2024    1:48 AM 02/04/2024    5:38 AM  CBC  WBC 4.0 - 10.5 K/uL 10.8  10.1  9.4   Hemoglobin 13.0 - 17.0 g/dL 9.8  9.0  9.3   Hematocrit 39.0 - 52.0 % 30.7  28.1  29.6  Platelets 150 - 400 K/uL 327  279  274       Latest Ref Rng & Units 02/09/2024    5:08 AM 02/08/2024    1:48 AM 02/05/2024    1:56 AM  BMP  Glucose 70 - 99 mg/dL 895  873  887   BUN 8 - 23 mg/dL 53  46  25   Creatinine 0.61 - 1.24 mg/dL 7.55  7.57  7.81   Sodium 135 - 145 mmol/L 135  137  136   Potassium 3.5 - 5.1 mmol/L 5.5  5.1  5.0   Chloride 98 - 111 mmol/L 101  102  105   CO2 22 - 32 mmol/L 22  23  23    Calcium  8.9 - 10.3 mg/dL 8.7  8.6  8.2    No results found.  Family Communication: Discussed with patient's wife at bedside. She understand and agree. All  questions answered.  Disposition: Status is: Inpatient Remains inpatient appropriate because: Pending placement  Planned Discharge Destination: Skilled nursing facility     Time spent: 42 minutes  Author: Concepcion Riser, MD 02/09/2024 4:18 PM Secure chat 7am to 7pm For on call review [email or web address]

## 2024-02-09 NOTE — Care Management Important Message (Signed)
 Important Message  Patient Details  Name: [name] MRN: [id number] Date of Birth: 14-Feb-1928   Important Message Given:  Yes - Medicare IM     Claretta Deed 02/09/2024, 2:49 PM

## 2024-02-09 NOTE — Care Management Important Message (Signed)
 Important Message  Patient Details  Name: Stephen Wilkins MRN: [id number] Date of Birth: 1928/01/15   Important Message Given:  Yes - Medicare IM     Claretta Deed 02/09/2024, 1:57 PM

## 2024-02-09 NOTE — Progress Notes (Signed)
 Daily Progress Note   Date: 02/09/2024   Patient Name: Stephen Wilkins  DOB: 1927-04-07  MRN: [id number]  Age / Sex: 89 y.o., male  Attending Physician: Darci Pore, MD Primary Care Physician: Norleen Lynwood ORN, MD Admit Date: 02/03/2024 Length of Stay: 4 days  Reason for Follow-up: Establishing goals of care  Past Medical History:  Diagnosis Date   ABNORMAL ELECTROCARDIOGRAM 06/21/2007   ALLERGIC RHINITIS 03/23/2007   Anxiety state, unspecified 09/06/2013   ASTHMATIC BRONCHITIS, ACUTE 04/27/2007   BENIGN PROSTATIC HYPERTROPHY 03/23/2007   BRONCHITIS NOT SPECIFIED AS ACUTE OR CHRONIC 04/21/2007   BURSITIS, RIGHT HIP 07/31/2008   CKD (chronic kidney disease)    COPD (chronic obstructive pulmonary disease) (HCC) 04/19/2016   Cough 01/29/2009   Dementia (HCC) 09/01/2010   ERECTILE DYSFUNCTION 03/23/2007   Esophageal stricture    Falls frequently    FREQUENCY, URINARY 04/26/2010   GERD 03/23/2007   HIATAL HERNIA    HYPERLIPIDEMIA 03/23/2007   HYPERTENSION 03/23/2007   MILD COGNITIVE IMPAIRMENT SO STATED 05/19/2010   NEPHROLITHIASIS, HX OF 03/23/2007   Nocturnal confusion 12/04/2023   Daughter states sun downs at night   PSA, INCREASED 06/27/2008   REACTIVE AIRWAY DISEASE 01/14/2010   SCHATZKI'S RING    SCIATICA, RIGHT 04/28/2008   Unspecified hypothyroidism 09/06/2013   Vascular dementia (HCC)     Subjective:   Subjective: Chart Reviewed. Updates received. Patient Assessed. Created space and opportunity for patient  and family to explore thoughts and feelings regarding current medical situation.  Today's Discussion: Today before meeting with the patient/family, I reviewed the chart notes including internal medicine note from yesterday, PT note from yesterday, nurse note from yesterday. I also reviewed vital signs, nursing flowsheets, medication administrations record, labs, and imaging. Labs reviewed include CBC which shows slight increase in white blood cell count  over the past several days from 8.8 to 9.4 to 10.1 yesterday and 10.8 today, unknown significance.  BMP shows stable creatinine at 2.44 today in the setting of CKD 3, remained stable and within baseline.  Today saw the patient at the bedside, no family was present.  However, his personal caregiver was present.  The patient was awake and lying in bed, he made eye contact and greeted me warmly.   He denies pain, nausea, vomiting.  He states his appetite is good and he ate breakfast this morning, his caregiver confirms that he did indeed eat a good breakfast.  I shared the plan moving forward, based on conversations with family yesterday, is for SNF/rehab and outpatient palliative care, which he agrees to.  I provided emotional and general support through therapeutic listening, empathy, sharing of stories, and other techniques. I answered all questions and addressed all concerns to the best of my ability.  Review of Systems  Constitutional:        Denies pain in general  Respiratory:  Negative for shortness of breath.   Cardiovascular:  Negative for chest pain.  Gastrointestinal:  Negative for abdominal pain, nausea and vomiting.    Objective:   Primary Diagnoses: Present on Admission:  Syncope  Dementia (HCC)  COPD (chronic obstructive pulmonary disease) (HCC)   Vital Signs:  BP 128/66 (BP Location: Left Arm)   Pulse 95   Temp 97.9 F (36.6 C)   Resp 19   SpO2 98%   Physical Exam Vitals and nursing note reviewed.  Constitutional:      General: He is not in acute distress.    Appearance: He is ill-appearing.  HENT:  Head: Normocephalic and atraumatic.  Pulmonary:     Effort: Pulmonary effort is normal. No respiratory distress.  Abdominal:     General: Abdomen is flat.  Skin:    General: Skin is warm and dry.  Neurological:     Mental Status: He is alert. He is disoriented.     Comments: Oriented to person and time; disoriented to place and situation  Psychiatric:         Mood and Affect: Mood normal.        Behavior: Behavior normal.     Palliative Assessment/Data: 60%   Existing Vynca/ACP Documentation: None  Assessment & Plan:   HPI/Patient Profile:  89 y.o. male  with past medical history of advanced dementia, COPD, stroke, BPH, hypertension, hyperlipidemia, CKD stage IIIb, recent diagnosis of acute ischemic stroke presented from home for evaluation of syncope.  She was admitted on 02/03/2024 with syncope with junctional rhythm, history of CVA with residual left lower extremity weakness, dementia with behavioral disturbances, CKD 3B, and others.   SUMMARY OF RECOMMENDATIONS   DNR-Limited Continue current scope of care Plan for SNF/rehab at discharge with outpatient palliative care through [facility] [place] (care connections)  Goals are quite clear at this time Anticipate discharge in the next 24 to 48 hours, pending bed offers Palliative medicine will back off for now Please notify us  of any significant change/new palliative needs  Symptom Management:  Per primary team Palliative medicine is available to assist as needed  Code Status: DNR - Limited (DNR/DNI)  Prognosis: Unable to determine  Discharge Planning: Skilled Nursing Facility for rehab with Palliative care service follow-up  Discussed with: Patient, caregiver, medical team, nursing team, [facility] team  Thank you for allowing us  to participate in the care of AHYAN KREEGER PMT will continue to support holistically.  Billing based on MDM: Moderate  Detailed review of medical records (labs, imaging, vital signs), medically appropriate exam, discussed with treatment team, counseling and education to patient, family, & staff, documenting clinical information, medication management, coordination of care  Camellia Kays, NP Palliative Medicine Team  Team Phone # [phone] (Nights/Weekends)  12/01/2020, 8:17 AM

## 2024-02-09 NOTE — Plan of Care (Signed)
  Problem: Clinical Measurements: Goal: Ability to maintain clinical measurements within normal limits will improve Outcome: Progressing   Problem: Clinical Measurements: Goal: Diagnostic test results will improve Outcome: Progressing   Problem: Coping: Goal: Level of anxiety will decrease Outcome: Progressing   Problem: Safety: Goal: Ability to remain free from injury will improve Outcome: Progressing   Problem: Skin Integrity: Goal: Risk for impaired skin integrity will decrease Outcome: Progressing

## 2024-02-10 DIAGNOSIS — Z7189 Other specified counseling: Secondary | ICD-10-CM | POA: Diagnosis not present

## 2024-02-10 DIAGNOSIS — Z66 Do not resuscitate: Secondary | ICD-10-CM | POA: Diagnosis not present

## 2024-02-10 DIAGNOSIS — Z789 Other specified health status: Secondary | ICD-10-CM

## 2024-02-10 DIAGNOSIS — Z515 Encounter for palliative care: Secondary | ICD-10-CM | POA: Diagnosis not present

## 2024-02-10 DIAGNOSIS — R55 Syncope and collapse: Secondary | ICD-10-CM | POA: Diagnosis not present

## 2024-02-10 LAB — CBC
HCT: 29.3 % — ABNORMAL LOW (ref 39.0–52.0)
Hemoglobin: 9.4 g/dL — ABNORMAL LOW (ref 13.0–17.0)
MCH: 30.7 pg (ref 26.0–34.0)
MCHC: 32.1 g/dL (ref 30.0–36.0)
MCV: 95.8 fL (ref 80.0–100.0)
Platelets: 312 K/uL (ref 150–400)
RBC: 3.06 MIL/uL — ABNORMAL LOW (ref 4.22–5.81)
RDW: 12.7 % (ref 11.5–15.5)
WBC: 8.7 K/uL (ref 4.0–10.5)
nRBC: 0 % (ref 0.0–0.2)

## 2024-02-10 LAB — BASIC METABOLIC PANEL WITH GFR
Anion gap: 14 (ref 5–15)
BUN: 57 mg/dL — ABNORMAL HIGH (ref 8–23)
CO2: 22 mmol/L (ref 22–32)
Calcium: 9.1 mg/dL (ref 8.9–10.3)
Chloride: 100 mmol/L (ref 98–111)
Creatinine, Ser: 2.33 mg/dL — ABNORMAL HIGH (ref 0.61–1.24)
GFR, Estimated: 25 mL/min — ABNORMAL LOW (ref >60)
Glucose, Bld: 108 mg/dL — ABNORMAL HIGH (ref 70–99)
Potassium: 5.2 mmol/L — ABNORMAL HIGH (ref 3.5–5.1)
Sodium: 136 mmol/L (ref 135–145)

## 2024-02-10 MED ORDER — DIPHENHYDRAMINE HCL 25 MG PO CAPS
25.0000 mg | ORAL_CAPSULE | Freq: Four times a day (QID) | ORAL | Status: DC | PRN
Start: 2024-02-10 — End: 2024-02-20
  Administered 2024-02-10 – 2024-02-18 (×2): 25 mg via ORAL
  Filled 2024-02-10 (×2): qty 1

## 2024-02-10 MED ORDER — CAMPHOR-MENTHOL 0.5-0.5 % EX LOTN
TOPICAL_LOTION | CUTANEOUS | Status: DC | PRN
  Administered 2024-02-10: 1 via TOPICAL
  Filled 2024-02-10: qty 222

## 2024-02-10 NOTE — TOC Progression Note (Signed)
 Transition of Care [facility]) - Initial/Assessment Note    Patient Details  Name: Stephen Wilkins MRN: [id number] Date of Birth: November 07, 1927  Transition of Care [facility]) CM/SW Contact:    Britt JULIANNA Bennetts, LCSW Phone Number: 02/10/2024, 10:22 AM  Clinical Narrative:                 CSW contacted pt's daughter, [name], to receive facility choice.  The family's preference is for [facility].  Their second choice is [facility].  CSW contacted [place] with admissions at [facility] to inquire as to whether the facility can extend a bed offer  and is awaiting a response.  TOC will continue to follow        Patient Goals and CMS Choice            Expected Discharge Plan and Services                                              Prior Living Arrangements/Services                       Activities of Daily Living   ADL Screening (condition at time of admission) Independently performs ADLs?: Yes (appropriate for developmental age) Is the patient deaf or have difficulty hearing?: Yes Does the patient have difficulty seeing, even when wearing glasses/contacts?: No Does the patient have difficulty concentrating, remembering, or making decisions?: No  Permission Sought/Granted                  Emotional Assessment              Admission diagnosis:  Syncope [R55] Syncope, unspecified syncope type [R55] Patient Active Problem List   Diagnosis Date Noted   Palliative care by specialist 02/07/2024   Syncope 02/04/2024   Hypomagnesemia 02/04/2024   Generalized weakness 01/24/2024   Cloudy urine 01/24/2024   Closed fracture of bone of left foot 01/24/2024   History of stroke 01/24/2024   ICAO (internal carotid artery occlusion), right 12/07/2023   Stroke (HCC) 12/04/2023   Productive cough 11/22/2023   Recurrent falls 11/22/2023   CKD (chronic kidney disease) stage 4, GFR 15-29 ml/min (HCC) 11/22/2023   Unqualified visual loss, both eyes 06/01/2023   PAD  (peripheral artery disease) 06/01/2023   Vertigo 11/29/2022   History of GI diverticular bleed 11/13/2022   History of colonic polyps 11/13/2022   Lower GI bleeding 11/12/2022   Lower GI bleed 11/11/2022   Hematochezia 11/11/2022   Unintentional weight loss 11/11/2022   Chronic constipation 11/11/2022   Normocytic anemia 11/11/2022   Acute GI bleeding 11/11/2022   OAB (overactive bladder) 11/09/2022   Lesion of ear 11/09/2022   Epithelial (juvenile) corneal dystrophy, 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   Shortness of breath 08/02/2022   Thoracic aortic aneurysm without rupture 08/02/2022   Asthmatic bronchitis 08/02/2022   Acute hypoxemic respiratory failure (HCC) 06/28/2022   Severe persistent asthma with exacerbation (HCC) 06/05/2022   Unspecified corneal edema 06/02/2022   Asthma 04/25/2022   Benign prostatic hyperplasia without urinary obstruction 04/25/2022   Hearing loss 04/25/2022   HTN (hypertension) 04/25/2022   Mixed hyperlipidemia 04/25/2022   Encounter for fitting and adjustment of hearing aid 04/25/2022   Encounter for well adult exam with abnormal findings 04/25/2022   Urinary frequency 10/24/2021   Heart murmur 10/24/2021   Low energy 10/21/2021  Vitamin D  deficiency 04/16/2021   Pain in joint of left shoulder 01/23/2020   Bilateral shoulder pain 11/22/2019   Myalgia 11/22/2019   Gait disorder 11/22/2019   Chronic pain of right thumb 05/17/2019   Primary osteoarthritis of first carpometacarpal joint of right hand 05/17/2019   Trigger finger of right thumb 05/17/2019   Corneal epithelial basement membrane dystrophy 03/04/2019   Keratoconjunctivitis sicca of both eyes not specified as Sjogren's 03/04/2019   Posterior vitreous detachment of both eyes 03/04/2019   Presbyopia of both eyes 03/04/2019   Pseudophakia of both eyes 03/04/2019   Febrile illness 12/27/2018   Choledocholithiasis 12/27/2018   Low back pain 07/11/2017   Non compliance w medication regimen 08/23/2016    Peripheral neuropathy 08/23/2016   COPD (chronic obstructive pulmonary disease) (HCC) 04/19/2016   Hyperglycemia 02/10/2016   Preventative health care 07/29/2015   CAP (community acquired pneumonia) 05/30/2015   Abnormal CXR 05/30/2015   Cough 04/22/2015   Asthma with exacerbation 04/22/2015   Urine abnormality 04/22/2015   Abdominal pain 09/11/2014   Abnormal liver function test 09/11/2014   Dysphagia 07/29/2014   Anxiety state 09/06/2013   CKD (chronic kidney disease) 09/06/2013   Hypothyroidism 09/06/2013   Rash and nonspecific skin eruption 02/27/2013   Dizziness and giddiness 01/04/2013   Abnormal TSH 06/04/2012   Gross hematuria 01/09/2012   Bruising 06/05/2011   Dementia (HCC) 09/01/2010   Cough variant asthma vs UACS  01/14/2010   ERECTILE DYSFUNCTION, ORGANIC 04/17/2009   FATIGUE 06/27/2008   PSA, INCREASED 06/27/2008   SCIATICA, RIGHT 04/28/2008   SCHATZKI'S RING 07/18/2007   Diaphragmatic hernia 07/18/2007   Schatzki's ring 07/18/2007   ABNORMAL ELECTROCARDIOGRAM 06/21/2007   Hyperlipidemia 03/23/2007   Essential hypertension 03/23/2007   Allergic rhinitis 03/23/2007   GERD 03/23/2007   BPH associated with nocturia 03/23/2007   NEPHROLITHIASIS, HX OF 03/23/2007   PCP:  Norleen Lynwood ORN, MD Pharmacy:   [facility] [address], [place] - [address] [place] [address] Phone: [phone] Fax: [phone]     Social Drivers of Health (SDOH) Social History: SDOH Screenings   Food Insecurity: No Food Insecurity (02/04/2024)  Housing: Low Risk  (02/04/2024)  Transportation Needs: No Transportation Needs (02/04/2024)  Utilities: Not At Risk (02/04/2024)  Alcohol Screen: Low Risk  (10/02/2023)  Depression (PHQ2-9): Low Risk  (01/22/2024)  Financial Resource Strain: Low Risk  (10/02/2023)  Physical Activity: Inactive (10/02/2023)  Social Connections: Moderately Isolated (02/04/2024)  Stress: No Stress Concern Present  (10/02/2023)  Tobacco Use: Medium Risk (02/03/2024)  Health Literacy: Adequate Health Literacy (10/02/2023)   SDOH Interventions:     Readmission Risk Interventions    11/13/2022   10:22 AM  Readmission Risk Prevention Plan  Transportation Screening Complete  PCP or Specialist Appt within 5-7 Days Complete  Home Care Screening Complete  Medication Review (RN CM) Complete

## 2024-02-10 NOTE — Plan of Care (Signed)

## 2024-02-10 NOTE — Progress Notes (Signed)
 Progress Note   Patient: Stephen Wilkins FMW:[id number] DOB: October 06, 1927 DOA: 02/03/2024     5 DOS: the patient was seen and examined on 02/10/2024   Brief hospital course: 89 year old with advanced dementia, COPD, stroke, BPH, hypertension, hyperlipidemia, CKD stage IIIb, recent diagnosis of acute ischemic stroke presented from home for evaluation of syncope. Patient lives at home with 24-hour support. He was also wearing 30-day cardiac monitor as a stroke workup on discharge. In the emergency room, blood pressure stable. On room air. EKG with sinus rhythm. Cardiac monitor at home shows junctional rhythm at the time of syncope. Admitted with cardiology consultation, who did not advised pacemaker given advanced dementia. Palliative care discussed with family agreed for outpatient palliative follow up.  Assessment and Plan: Syncope with junctional rhythm: Currently stable.  Not on any rate control medications.  Cardiology advised with advanced dementia, he is not a pacemaker candidate.  Continue supportive care.     History of stroke with residual left lower extremity weakness: On aspirin , Plavix  and Crestor .  No evidence of new neurological deficits.   Dementia with behavioral disturbances: Delirium precautions.  Continue Aricept . Delirium precautions. Awaiting SNF placement.   Hypomagnesemia: Replaced.   CKD stage IIIb: At baseline. Avoid nephrotoxic drugs. K high will continue to trend.   GERD: On PPI.   Goal of care: Patient does have advanced directive for natural death.  CODE STATUS DNR/DNI.  No aggressive interventions per family.   PT/ OT advised skilled nursing facility for rehab. Palliative team discussed goals of care with family, agreed for outpatient palliative follow up.     Out of bed to chair. Incentive spirometry. Nursing supportive care. Fall, aspiration precautions. Diet:  Diet Orders (From admission, onward)     Start     Ordered   02/04/24 0441  Diet Heart Room  service appropriate? Yes; Fluid consistency: Thin  Diet effective now       Question Answer Comment  Room service appropriate? Yes   Fluid consistency: Thin      02/04/24 0441           DVT prophylaxis: heparin injection 5,000 Units Start: 02/04/24 1400  Level of care: Telemetry   Code Status: Limited: Do not attempt resuscitation (DNR) -DNR-LIMITED -Do Not Intubate/DNI   Subjective: Patient is seen and examined today morning.  He is lying comfortably, no complaints. Eating fair. No family at bedside.   Physical Exam: Vitals:   02/09/24 2031 02/09/24 2150 02/10/24 0608 02/10/24 0740  BP: (!) 154/78  121/67 (!) 118/93  Pulse: 92  92 89  Resp:    20  Temp: (!) 97.2 F (36.2 C)  98.1 F (36.7 C) (!) 97.5 F (36.4 C)  TempSrc:   Oral   SpO2: 95%  96% 97%  Weight:  83.2 kg    Height:  6' (1.829 m)      General - Elderly Caucasian male, no acute distress HEENT - PERRLA, EOMI, atraumatic head, non tender sinuses. Lung - Clear, no rales, rhonchi, wheezes. Heart - S1, S2 heard, no murmurs, rubs, no pedal edema. Abdomen - Soft, non tender, bowel sounds good Neuro - Alert, awake and confused, able to follow commands, non focal exam. Skin - Warm and dry.  Data Reviewed:      Latest Ref Rng & Units 02/10/2024    2:08 AM 02/09/2024    5:08 AM 02/08/2024    1:48 AM  CBC  WBC 4.0 - 10.5 K/uL 8.7  10.8  10.1  Hemoglobin 13.0 - 17.0 g/dL 9.4  9.8  9.0   Hematocrit 39.0 - 52.0 % 29.3  30.7  28.1   Platelets 150 - 400 K/uL 312  327  279       Latest Ref Rng & Units 02/10/2024    2:08 AM 02/09/2024    5:08 AM 02/08/2024    1:48 AM  BMP  Glucose 70 - 99 mg/dL 891  895  873   BUN 8 - 23 mg/dL 57  53  46   Creatinine 0.61 - 1.24 mg/dL 7.66  7.55  7.57   Sodium 135 - 145 mmol/L 136  135  137   Potassium 3.5 - 5.1 mmol/L 5.2  5.5  5.1   Chloride 98 - 111 mmol/L 100  101  102   CO2 22 - 32 mmol/L 22  22  23    Calcium  8.9 - 10.3 mg/dL 9.1  8.7  8.6    No results  found.  Family Communication: Discussed with patient's wife at bedside. She understand and agree. All questions answered.  Disposition: Status is: Inpatient Remains inpatient appropriate because: Pending placement  Planned Discharge Destination: Skilled nursing facility     Time spent: 42 minutes  Author: Concepcion Riser, MD 02/10/2024 2:54 PM Secure chat 7am to 7pm For on call review [email or web address]

## 2024-02-11 DIAGNOSIS — R55 Syncope and collapse: Secondary | ICD-10-CM | POA: Diagnosis not present

## 2024-02-11 DIAGNOSIS — Z515 Encounter for palliative care: Secondary | ICD-10-CM | POA: Diagnosis not present

## 2024-02-11 DIAGNOSIS — F01B4 Vascular dementia, moderate, with anxiety: Secondary | ICD-10-CM | POA: Diagnosis not present

## 2024-02-11 DIAGNOSIS — D649 Anemia, unspecified: Secondary | ICD-10-CM | POA: Diagnosis not present

## 2024-02-11 MED ORDER — POLYETHYLENE GLYCOL 3350 17 G PO PACK
17.0000 g | PACK | Freq: Every day | ORAL | Status: DC
  Administered 2024-02-11 – 2024-02-20 (×9): 17 g via ORAL
  Filled 2024-02-11 (×9): qty 1

## 2024-02-11 MED ORDER — DOCUSATE SODIUM 100 MG PO CAPS
100.0000 mg | ORAL_CAPSULE | Freq: Two times a day (BID) | ORAL | Status: DC
  Administered 2024-02-11 – 2024-02-20 (×17): 100 mg via ORAL
  Filled 2024-02-11 (×18): qty 1

## 2024-02-11 MED ORDER — SENNOSIDES-DOCUSATE SODIUM 8.6-50 MG PO TABS: 2.0000 | ORAL_TABLET | Freq: Every day | ORAL | Status: DC

## 2024-02-11 MED ORDER — SENNA 8.6 MG PO TABS
2.0000 | ORAL_TABLET | Freq: Every day | ORAL | Status: DC
  Administered 2024-02-11 – 2024-02-19 (×7): 17.2 mg via ORAL
  Filled 2024-02-11 (×8): qty 2

## 2024-02-11 NOTE — Plan of Care (Signed)
  Problem: Nutrition: Goal: Adequate nutrition will be maintained Outcome: Progressing   Problem: Coping: Goal: Level of anxiety will decrease Outcome: Progressing   Problem: Elimination: Goal: Will not experience complications related to bowel motility Outcome: Progressing   Problem: Skin Integrity: Goal: Risk for impaired skin integrity will decrease Outcome: Progressing

## 2024-02-11 NOTE — Progress Notes (Signed)
 Progress Note   Patient: Stephen Wilkins FMW:[id number] DOB: 09-Feb-1928 DOA: 02/03/2024     6 DOS: the patient was seen and examined on 02/11/2024   Brief hospital course: 89 year old with advanced dementia, COPD, stroke, BPH, hypertension, hyperlipidemia, CKD stage IIIb, recent diagnosis of acute ischemic stroke presented from home for evaluation of syncope. Patient lives at home with 24-hour support. He was also wearing 30-day cardiac monitor as a stroke workup on discharge. In the emergency room, blood pressure stable. On room air. EKG with sinus rhythm. Cardiac monitor at home shows junctional rhythm at the time of syncope. Admitted with cardiology consultation, who did not advised pacemaker given advanced dementia. Palliative care discussed with family agreed for outpatient palliative follow up.  Assessment and Plan: Syncope with junctional rhythm: Currently stable. Not on any rate control medications.  Cardiology advised with advanced dementia, he is not a pacemaker candidate.  Continue supportive care.     History of stroke with residual left lower extremity weakness: On aspirin , Plavix  and Crestor .  No evidence of new neurological deficits.   Dementia with behavioral disturbances: Delirium precautions.  Continue Aricept . Delirium precautions. Awaiting SNF placement.   Hypomagnesemia: Replaced.   CKD stage IIIb: At baseline. Avoid nephrotoxic drugs. K high will continue to trend.   GERD: On PPI.   Goal of care: Patient does have advanced directive for natural death.  CODE STATUS DNR/DNI.  No aggressive interventions per family.   PT/ OT advised skilled nursing facility for rehab. Palliative team discussed goals of care with family, agreed for outpatient palliative follow up.    Constipation regimen ordered. Out of bed to chair. Incentive spirometry. Nursing supportive care. Fall, aspiration precautions. Diet:  Diet Orders (From admission, onward)     Start     Ordered    02/04/24 0441  Diet Heart Room service appropriate? Yes; Fluid consistency: Thin  Diet effective now       Question Answer Comment  Room service appropriate? Yes   Fluid consistency: Thin      02/04/24 0441           DVT prophylaxis: heparin injection 5,000 Units Start: 02/04/24 1400  Level of care: Telemetry   Code Status: Limited: Do not attempt resuscitation (DNR) -DNR-LIMITED -Do Not Intubate/DNI   Subjective: Patient is seen and examined today morning.  He is lying comfortably, no complaints. Sitter at bedside reported he is constipated.  Physical Exam: Vitals:   02/10/24 2015 02/11/24 0453 02/11/24 0736 02/11/24 1208  BP:  131/72 128/67 124/66  Pulse:  97 86 86  Resp:  17 20 20   Temp:  98.1 F (36.7 C) 98.6 F (37 C) 97.9 F (36.6 C)  TempSrc:  Oral    SpO2: 95% 100% 96% 97%  Weight:      Height:        General - Elderly Caucasian male, no acute distress HEENT - PERRLA, EOMI, atraumatic head, non tender sinuses. Lung - Clear, no rales, rhonchi, wheezes. Heart - S1, S2 heard, no murmurs, rubs, no pedal edema. Abdomen - Soft, non tender, bowel sounds good Neuro - Alert, awake and confused, able to follow commands, non focal exam. Skin - Warm and dry.  Data Reviewed:      Latest Ref Rng & Units 02/10/2024    2:08 AM 02/09/2024    5:08 AM 02/08/2024    1:48 AM  CBC  WBC 4.0 - 10.5 K/uL 8.7  10.8  10.1   Hemoglobin 13.0 - 17.0 g/dL  9.4  9.8  9.0   Hematocrit 39.0 - 52.0 % 29.3  30.7  28.1   Platelets 150 - 400 K/uL 312  327  279       Latest Ref Rng & Units 02/10/2024    2:08 AM 02/09/2024    5:08 AM 02/08/2024    1:48 AM  BMP  Glucose 70 - 99 mg/dL 891  895  873   BUN 8 - 23 mg/dL 57  53  46   Creatinine 0.61 - 1.24 mg/dL 7.66  7.55  7.57   Sodium 135 - 145 mmol/L 136  135  137   Potassium 3.5 - 5.1 mmol/L 5.2  5.5  5.1   Chloride 98 - 111 mmol/L 100  101  102   CO2 22 - 32 mmol/L 22  22  23    Calcium  8.9 - 10.3 mg/dL 9.1  8.7  8.6    No results  found.  Family Communication: Discussed with patient's care taker at bedside. She understand and agree. All questions answered.  Disposition: Status is: Inpatient Remains inpatient appropriate because: Pending placement  Planned Discharge Destination: Skilled nursing facility     Time spent: 41 minutes  Author: Concepcion Riser, MD 02/11/2024 1:44 PM Secure chat 7am to 7pm For on call review [email or web address]

## 2024-02-12 DIAGNOSIS — R55 Syncope and collapse: Secondary | ICD-10-CM | POA: Diagnosis not present

## 2024-02-12 LAB — BASIC METABOLIC PANEL WITH GFR
Anion gap: 9 (ref 5–15)
BUN: 60 mg/dL — ABNORMAL HIGH (ref 8–23)
CO2: 24 mmol/L (ref 22–32)
Calcium: 9 mg/dL (ref 8.9–10.3)
Chloride: 101 mmol/L (ref 98–111)
Creatinine, Ser: 2.51 mg/dL — ABNORMAL HIGH (ref 0.61–1.24)
GFR, Estimated: 23 mL/min — ABNORMAL LOW (ref >60)
Glucose, Bld: 107 mg/dL — ABNORMAL HIGH (ref 70–99)
Potassium: 4.8 mmol/L (ref 3.5–5.1)
Sodium: 134 mmol/L — ABNORMAL LOW (ref 135–145)

## 2024-02-12 NOTE — TOC Initial Note (Signed)
 Transition of Care [facility]) - Initial/Assessment Note    Patient Details  Name: Stephen Wilkins MRN: [id number] Date of Birth: 23-Dec-1927  Transition of Care [facility]) CM/SW Contact:    Sherline Clack, LCSWA Phone Number: 02/12/2024, 10:15 AM  Clinical Narrative:                  CSW spoke with admissions person at [facility], who informed CSW the facility is able to offer a bed tomorrow. CSW reached out to [employer] to start insurance authorization. CSW will continue to follow and update DC plan.   Expected Discharge Plan: Skilled Nursing Facility Barriers to Discharge: Insurance Authorization   Patient Goals and CMS Choice     Choice offered to / list presented to : Patient, Adult Children, Spouse      Expected Discharge Plan and Services       Living arrangements for the past 2 months: Single Family Home                                      Prior Living Arrangements/Services Living arrangements for the past 2 months: Single Family Home Lives with:: Spouse Patient language and need for interpreter reviewed:: Yes            Current home services: DME, Comptroller, Other (comment) (Hired Caregivers)    Activities of Daily Living   ADL Screening (condition at time of admission) Independently performs ADLs?: Yes (appropriate for developmental age) Is the patient deaf or have difficulty hearing?: Yes Does the patient have difficulty seeing, even when wearing glasses/contacts?: No Does the patient have difficulty concentrating, remembering, or making decisions?: No  Permission Sought/Granted                  Emotional Assessment Appearance:: Appears stated age Attitude/Demeanor/Rapport: Unable to Assess Affect (typically observed): Unable to Assess Orientation: : Oriented to Self      Admission diagnosis:  Syncope [R55] Syncope, unspecified syncope type [R55] Patient Active Problem List   Diagnosis Date Noted   Palliative care by specialist  02/07/2024   Syncope 02/04/2024   Hypomagnesemia 02/04/2024   Generalized weakness 01/24/2024   Cloudy urine 01/24/2024   Closed fracture of bone of left foot 01/24/2024   History of stroke 01/24/2024   ICAO (internal carotid artery occlusion), right 12/07/2023   Stroke (HCC) 12/04/2023   Productive cough 11/22/2023   Recurrent falls 11/22/2023   CKD (chronic kidney disease) stage 4, GFR 15-29 ml/min (HCC) 11/22/2023   Unqualified visual loss, both eyes 06/01/2023   PAD (peripheral artery disease) 06/01/2023   Vertigo 11/29/2022   History of GI diverticular bleed 11/13/2022   History of colonic polyps 11/13/2022   Lower GI bleeding 11/12/2022   Lower GI bleed 11/11/2022   Hematochezia 11/11/2022   Unintentional weight loss 11/11/2022   Chronic constipation 11/11/2022   Normocytic anemia 11/11/2022   Acute GI bleeding 11/11/2022   OAB (overactive bladder) 11/09/2022   Lesion of ear 11/09/2022   Epithelial (juvenile) corneal dystrophy, 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   Shortness of breath 08/02/2022   Thoracic aortic aneurysm without rupture 08/02/2022   Asthmatic bronchitis 08/02/2022   Acute hypoxemic respiratory failure (HCC) 06/28/2022   Severe persistent asthma with exacerbation (HCC) 06/05/2022   Unspecified corneal edema 06/02/2022   Asthma 04/25/2022   Benign prostatic hyperplasia without urinary obstruction 04/25/2022   Hearing loss 04/25/2022   HTN (hypertension) 04/25/2022  Mixed hyperlipidemia 04/25/2022   Encounter for fitting and adjustment of hearing aid 04/25/2022   Encounter for well adult exam with abnormal findings 04/25/2022   Urinary frequency 10/24/2021   Heart murmur 10/24/2021   Low energy 10/21/2021   Vitamin D  deficiency 04/16/2021   Pain in joint of left shoulder 01/23/2020   Bilateral shoulder pain 11/22/2019   Myalgia 11/22/2019   Gait disorder 11/22/2019   Chronic pain of right thumb 05/17/2019   Primary osteoarthritis of first carpometacarpal joint  of right hand 05/17/2019   Trigger finger of right thumb 05/17/2019   Corneal epithelial basement membrane dystrophy 03/04/2019   Keratoconjunctivitis sicca of both eyes not specified as Sjogren's 03/04/2019   Posterior vitreous detachment of both eyes 03/04/2019   Presbyopia of both eyes 03/04/2019   Pseudophakia of both eyes 03/04/2019   Febrile illness 12/27/2018   Choledocholithiasis 12/27/2018   Low back pain 07/11/2017   Non compliance w medication regimen 08/23/2016   Peripheral neuropathy 08/23/2016   COPD (chronic obstructive pulmonary disease) (HCC) 04/19/2016   Hyperglycemia 02/10/2016   Preventative health care 07/29/2015   CAP (community acquired pneumonia) 05/30/2015   Abnormal CXR 05/30/2015   Cough 04/22/2015   Asthma with exacerbation 04/22/2015   Urine abnormality 04/22/2015   Abdominal pain 09/11/2014   Abnormal liver function test 09/11/2014   Dysphagia 07/29/2014   Anxiety state 09/06/2013   CKD (chronic kidney disease) 09/06/2013   Hypothyroidism 09/06/2013   Rash and nonspecific skin eruption 02/27/2013   Dizziness and giddiness 01/04/2013   Abnormal TSH 06/04/2012   Gross hematuria 01/09/2012   Bruising 06/05/2011   Dementia (HCC) 09/01/2010   Cough variant asthma vs UACS  01/14/2010   ERECTILE DYSFUNCTION, ORGANIC 04/17/2009   FATIGUE 06/27/2008   PSA, INCREASED 06/27/2008   SCIATICA, RIGHT 04/28/2008   SCHATZKI'S RING 07/18/2007   Diaphragmatic hernia 07/18/2007   Schatzki's ring 07/18/2007   ABNORMAL ELECTROCARDIOGRAM 06/21/2007   Hyperlipidemia 03/23/2007   Essential hypertension 03/23/2007   Allergic rhinitis 03/23/2007   GERD 03/23/2007   BPH associated with nocturia 03/23/2007   NEPHROLITHIASIS, HX OF 03/23/2007   PCP:  Norleen Lynwood ORN, MD Pharmacy:   [facility] [address], [place] - [address] [place] [address] Phone: [phone] Fax: [phone]     Social Drivers of Health  (SDOH) Social History: SDOH Screenings   Food Insecurity: No Food Insecurity (02/04/2024)  Housing: Low Risk  (02/04/2024)  Transportation Needs: No Transportation Needs (02/04/2024)  Utilities: Not At Risk (02/04/2024)  Alcohol Screen: Low Risk  (10/02/2023)  Depression (PHQ2-9): Low Risk  (01/22/2024)  Financial Resource Strain: Low Risk  (10/02/2023)  Physical Activity: Inactive (10/02/2023)  Social Connections: Moderately Isolated (02/04/2024)  Stress: No Stress Concern Present (10/02/2023)  Tobacco Use: Medium Risk (02/03/2024)  Health Literacy: Adequate Health Literacy (10/02/2023)   SDOH Interventions:     Readmission Risk Interventions    11/13/2022   10:22 AM  Readmission Risk Prevention Plan  Transportation Screening Complete  PCP or Specialist Appt within 5-7 Days Complete  Home Care Screening Complete  Medication Review (RN CM) Complete

## 2024-02-12 NOTE — Progress Notes (Signed)
 Physical Therapy Treatment Patient Details Name: Stephen Wilkins MRN: [id number] DOB: January 17, 1928 Today's Date: 02/12/2024   History of Present Illness Pt is a 89 y/o male presenting after syncopal episode at home. Cardiology following and recommend conservative mgmt. Recent admission 9/1-9/5 for acute CVA, syncope and AKI. PMH: hypertension, hyperlipidemia, GERD, dementia, COPD, anxiety, hypothyroidism, esophageal stricture, CKD, recent fall, GI bleed and thoracic aortic aneurysm.    PT Comments  Pt is limited in mobility progression due to lack of awareness of his bowels moving. Unbeknown, to PT pt was on bed pan on entry. Pt had slid off bed pan. Pt able to come to the EoB with modA. Pt given modAx2 to come to standing at [facility]. Pt noted to have had a BM on standing which pt was unaware of. Session consisted of 7x sit<>stand with modAx2 to totalAx2 for NT to provide pericare. Pt unable to stand more than 30 sec at a time. By the end pt so fatigued that he needed total A for returning to bed. Pt still felt he needed to have BM at that time. NT to check back for final cleaning. D/c plans remain appropriate. PT will continue to follow acutely.      If plan is discharge home, recommend the following: Two people to help with walking and/or transfers;A lot of help with bathing/dressing/bathroom;Assist for transportation;Assistance with cooking/housework;Supervision due to cognitive status   Can travel by private vehicle     No  Equipment Recommendations  None recommended by PT       Precautions / Restrictions Precautions Precautions: Fall Recall of Precautions/Restrictions: Impaired Precaution/Restrictions Comments: hx of syncope     Mobility  Bed Mobility Overal bed mobility: Needs Assistance Bed Mobility: Supine to Sit, Sit to Supine     Supine to sit: Mod assist, Used rails, +2 for physical assistance Sit to supine: +2 for physical assistance, Total assist   General bed mobility  comments: RN in room with patient, stating pt is wanting to get up, with HoB elevated pt is able to come to EoB with modA, pt unaware that he had slid off the BSC, pt so fatigue from standing and all the way at the foot of the bed requires totalAx2 with the foot board removed to return to supine and slide up in the bed    Transfers Overall transfer level: Needs assistance Equipment used: Rolling walker (2 wheels) Transfers: Sit to/from Stand Sit to Stand: Mod assist, +2 physical assistance, Max assist, Total assist           General transfer comment: pt requires modAx2 for standing at EoB, upon standing pt noted to have had a BM and was continuing to have a BM, cleaning supplies were procured while pt sitting on EoB with PT doing LE exercises. On second attempt, pt modAx2 for power up to RW, pt unable to stand long enough for cleaning, NT employed to do cleaning while PT and MS stood patient with face to face assist, pt continuing to have BM , attempted to stand for pivot to [facility] but pt unable to weightshift to  offweight  RLE and ultimately could not pivot, stood x2 for cleaning at which point pt completely exhausted and requiring return to bed, not yet finished with BM,      Balance Overall balance assessment: Needs assistance Sitting-balance support: No upper extremity supported, Feet supported Sitting balance-Leahy Scale: Fair       Standing balance-Leahy Scale: Poor Standing balance comment: pt needed support of  RW and external support during pre-gait and gait activity.                            Communication Communication Communication: Impaired Factors Affecting Communication: Hearing impaired  Cognition Arousal: Alert Behavior During Therapy: Flat affect, WFL for tasks assessed/performed   PT - Cognitive impairments: History of cognitive impairments                         Following commands: Impaired Following commands impaired: Follows one step  commands with increased time, Follows one step commands inconsistently    Cueing Cueing Techniques: Verbal cues, Tactile cues, Gestural cues  Exercises General Exercises - Lower Extremity Long Arc Quad: AROM, Both, 10 reps, Seated Hip Flexion/Marching: AROM, Both, 10 reps, Seated    General Comments General comments (skin integrity, edema, etc.): Pt with ongoing BM throughout session, however pt unaware it was happening      Pertinent Vitals/Pain Pain Assessment Pain Assessment: Faces Faces Pain Scale: Hurts a little bit Pain Location: generalized with standing Pain Descriptors / Indicators: Grimacing, Guarding Pain Intervention(s): Limited activity within patient's tolerance, Monitored during session, Repositioned     PT Goals (current goals can now be found in the care plan section) Acute Rehab PT Goals Patient Stated Goal: to go to rehab PT Goal Formulation: With patient/family Time For Goal Achievement: 02/20/24 Potential to Achieve Goals: Fair Progress towards PT goals: Progressing toward goals    Frequency    Min 2X/week      PT Plan         AM-PAC PT 6 Clicks Mobility   Outcome Measure  Help needed turning from your back to your side while in a flat bed without using bedrails?: A Lot Help needed moving from lying on your back to sitting on the side of a flat bed without using bedrails?: A Lot Help needed moving to and from a bed to a chair (including a wheelchair)?: Total Help needed standing up from a chair using your arms (e.g., wheelchair or bedside chair)?: Total Help needed to walk in hospital room?: Total Help needed climbing 3-5 steps with a railing? : Total 6 Click Score: 8    End of Session Equipment Utilized During Treatment: Gait belt Activity Tolerance: Patient limited by fatigue Patient left: in bed;with call bell/phone within reach;with bed alarm set;with family/visitor present Nurse Communication: Mobility status PT Visit Diagnosis:  Other abnormalities of gait and mobility (R26.89);History of falling (Z91.81);Other symptoms and signs involving the nervous system (R29.898)     Time: 1530-1601 PT Time Calculation (min) (ACUTE ONLY): 31 min  Charges:    $Therapeutic Exercise: 8-22 mins $Therapeutic Activity: 8-22 mins PT General Charges $$ ACUTE PT VISIT: 1 Visit                     Jacolyn Joaquin B. Fleeta Lapidus PT, DPT Acute Rehabilitation Services Please use secure chat or  Call Office [phone]    Almarie KATHEE Fleeta Fleet 02/12/2024, 4:22 PM

## 2024-02-12 NOTE — Progress Notes (Signed)
 PROGRESS NOTE  DARRICK GREENLAW  FMW:[id number] DOB: [birth year] DOA: 02/03/2024 PCP: Norleen Lynwood ORN, MD   Brief Narrative: Patient is a 89 year old male with history of advanced dementia, COPD, stroke, BPH, hypertension, hyperlipidemia, CKD stage IIIb, recent acute ischemic stroke who presented further evaluation of syncope.  Patient lives at home with 24-hour support, also wearing 30-day cardiac monitor at the stroke workup and discharge.  On [place], cardiac monitor showed junctional rhythm at the time of syncope.  Patient admitted for further evaluation of syncope.  Cardiology also consulted.  Palliative care also consulted for goals of care.  PT recommending SNF on discharge.  Waiting for  placement.  Medically stable for discharge whenever possible.  Assessment & Plan:  Principal Problem:   Syncope Active Problems:   Dementia (HCC)   COPD (chronic obstructive pulmonary disease) (HCC)   Normocytic anemia   Hypomagnesemia   Palliative care by specialist  Syncope with  junctional rhythm: Currently hemodynamically stable.  Cardiology was consulted after admission.  She is not a candidate for pacemaker due to her advanced dementia.  Continue supportive  care.  History of stroke with residual left lower extremity weakness: On aspirin , Plavix , Crestor . no evidence of new focal neurological deficits  Dementia with behavioral disturbances: Continue delirium precaution, frequent reorientation.  Continue Aricept   Hypomagnesemia: Supplemented  CKD stage IIIb: Currently kidney function at baseline. Baseline creatinine around 2.5. Avoid nephrotoxins.  GERD: Continue PPI  Debility/deconditioning/goals of care: CODE STATUS DNR.  Has advanced dementia.  Palliative care was following for goals of care.  No aggressive interventions as per family.  PT/OT advised skilled nursing facility for rehabilitation.          DVT prophylaxis:heparin injection 5,000 Units Start: 02/04/24 1400     Code  Status: Limited: Do not attempt resuscitation (DNR) -DNR-LIMITED -Do Not Intubate/DNI   Family Communication: None at bedside  Patient status:Inpatient  Patient is from :Home  Anticipated discharge to:SNF  Estimated DC date:whenever possible   Consultants: Palliative care  Procedures:None  Antimicrobials:  Anti-infectives (From admission, onward)    None       Subjective: Patient seen and examined at bedside today.  Hemodynamically stable.  Lying on bed.  Alert and awake, oriented to place only.  Not oriented to time.  Does not appear agitated.  He was being cleaned because he had a bowel movement.  Not in any Distress.  Objective: Vitals:   02/11/24 1955 02/12/24 0048 02/12/24 0602 02/12/24 0800  BP:  114/83 118/74 109/69  Pulse:  (!) 103 (!) 103 (!) 103  Resp:    20  Temp:  97.6 F (36.4 C) 97.6 F (36.4 C) 98.4 F (36.9 C)  TempSrc:      SpO2: 95% 97% 99% 98%  Weight:      Height:        Intake/Output Summary (Last 24 hours) at 02/12/2024 1027 Last data filed at 02/12/2024 9167 Gross per 24 hour  Intake 354 ml  Output --  Net 354 ml   Filed Weights   02/09/24 2150  Weight: 83.2 kg    Examination:  General exam: Overall comfortable, not in distress, pleasantly confused elderly gentleman HEENT: PERRL Respiratory system:  no wheezes or crackles  Cardiovascular system: S1 & S2 heard, RRR.  Gastrointestinal system: Abdomen is nondistended, soft and nontender. Central nervous system: Alert and awake,oriented to place only Extremities: No edema, no clubbing ,no cyanosis Skin: No rashes, no ulcers,no icterus     Data Reviewed: I  have personally reviewed following labs and imaging studies  CBC: Recent Labs  Lab 02/08/24 0148 02/09/24 0508 02/10/24 0208  WBC 10.1 10.8* 8.7  HGB 9.0* 9.8* 9.4*  HCT 28.1* 30.7* 29.3*  MCV 95.3 96.2 95.8  PLT 279 327 312   Basic Metabolic Panel: Recent Labs  Lab 02/08/24 0148 02/09/24 0508 02/10/24 0208  02/12/24 0423  NA 137 135 136 134*  K 5.1 5.5* 5.2* 4.8  CL 102 101 100 101  CO2 23 22 22 24   GLUCOSE 126* 104* 108* 107*  BUN 46* 53* 57* 60*  CREATININE 2.42* 2.44* 2.33* 2.51*  CALCIUM  8.6* 8.7* 9.1 9.0     No results found for this or any previous visit (from the past 240 hours).   Radiology Studies: No results found.  Scheduled Meds:  aspirin  EC  81 mg Oral Daily   budesonide -glycopyrrolate -formoterol   2 puff Inhalation BID   clopidogrel   75 mg Oral Daily   docusate sodium   100 mg Oral BID   donepezil   5 mg Oral Daily   fesoterodine  4 mg Oral Daily   finasteride   5 mg Oral Daily   heparin  5,000 Units Subcutaneous Q8H   pantoprazole   40 mg Oral Daily   polyethylene glycol  17 g Oral Daily   rosuvastatin   10 mg Oral Daily   senna  2 tablet Oral QHS   tamsulosin   0.4 mg Oral QHS   Continuous Infusions:   LOS: 7 days   Ivonne Mustache, MD [facility] P11/01/2024, 10:27 AM

## 2024-02-12 NOTE — Plan of Care (Signed)

## 2024-02-12 NOTE — Plan of Care (Signed)

## 2024-02-13 DIAGNOSIS — R55 Syncope and collapse: Secondary | ICD-10-CM | POA: Diagnosis not present

## 2024-02-13 LAB — BASIC METABOLIC PANEL WITH GFR
Anion gap: 11 (ref 5–15)
BUN: 65 mg/dL — ABNORMAL HIGH (ref 8–23)
CO2: 25 mmol/L (ref 22–32)
Calcium: 9.3 mg/dL (ref 8.9–10.3)
Chloride: 102 mmol/L (ref 98–111)
Creatinine, Ser: 2.47 mg/dL — ABNORMAL HIGH (ref 0.61–1.24)
GFR, Estimated: 24 mL/min — ABNORMAL LOW (ref >60)
Glucose, Bld: 110 mg/dL — ABNORMAL HIGH (ref 70–99)
Potassium: 5 mmol/L (ref 3.5–5.1)
Sodium: 138 mmol/L (ref 135–145)

## 2024-02-13 NOTE — Progress Notes (Signed)
 Occupational Therapy Treatment Patient Details Name: Stephen Wilkins MRN: [id number] DOB: [birth year] Today's Date: 02/13/2024   History of present illness Pt is a 89 y/o male presenting after syncopal episode at home. Cardiology following and recommend conservative mgmt. Recent admission 9/1-9/5 for acute CVA, syncope and AKI. PMH: hypertension, hyperlipidemia, GERD, dementia, COPD, anxiety, hypothyroidism, esophageal stricture, CKD, recent fall, GI bleed and thoracic aortic aneurysm.   OT comments  Patient received in supine and agreeable to OT session. Patient instructed on bed mobility and required max assist with use of bed pad to scoot to EOB.  Patient able to stand from EOB into [place] with mod assist and was found to have soiled bed. Patient performed multiple stands from [place] to complete cleaning due to requiring frequent rest breaks.  Patient was able to transfer to recliner and performed grooming tasks with cues for sequencing.  Patient will benefit from continued inpatient follow up therapy, <3 hours/day.  Acute OT to continue to follow to address established goals to facilitate DC to next venue of care.        If plan is discharge home, recommend the following:  A lot of help with walking and/or transfers;Two people to help with walking and/or transfers;A lot of help with bathing/dressing/bathroom;Two people to help with bathing/dressing/bathroom   Equipment Recommendations  None recommended by OT    Recommendations for Other Services      Precautions / Restrictions Precautions Precautions: Fall Recall of Precautions/Restrictions: Impaired Precaution/Restrictions Comments: hx of syncope       Mobility Bed Mobility Overal bed mobility: Needs Assistance Bed Mobility: Supine to Sit     Supine to sit: Max assist     General bed mobility comments: assistance with BLEs and raising trunk.  Bed pad used to assist with scooting towards EOB    Transfers Overall transfer  level: Needs assistance Equipment used: Ambulation equipment used Transfers: Sit to/from Stand, Bed to chair/wheelchair/BSC Sit to Stand: Mod assist, Max assist           General transfer comment: patient was mod assist to stand from EOB into stedy and min assist from stedy pads Transfer via Lift Equipment: Stedy   Balance Overall balance assessment: Needs assistance Sitting-balance support: No upper extremity supported, Feet supported Sitting balance-Leahy Scale: Fair Sitting balance - Comments: CGA EOB   Standing balance support: Bilateral upper extremity supported Standing balance-Leahy Scale: Poor Standing balance comment: reliant on Stedy for support                           ADL either performed or assessed with clinical judgement   ADL Overall ADL's : Needs assistance/impaired     Grooming: Wash/dry hands;Wash/dry face;Oral care;Brushing hair;Supervision/safety;Cueing for sequencing;Sitting       Lower Body Bathing: Maximal assistance;Sit to/from stand Lower Body Bathing Details (indicate cue type and reason): assistance with cleaning due to BM in bed. Patient stood in stedy with frequent seated breaks                            Extremity/Trunk Assessment              Vision       Perception     Praxis     Communication Communication Communication: Impaired Factors Affecting Communication: Hearing impaired   Cognition Arousal: Alert Behavior During Therapy: Flat affect, WFL for tasks assessed/performed Cognition: History of cognitive impairments  OT - Cognition Comments: hx of dementia, pleasant and coorperative                 Following commands: Impaired Following commands impaired: Follows one step commands with increased time, Follows one step commands inconsistently      Cueing   Cueing Techniques: Verbal cues, Tactile cues, Gestural cues  Exercises      Shoulder Instructions        General Comments personal sitters in room    Pertinent Vitals/ Pain       Pain Assessment Pain Assessment: Faces Faces Pain Scale: Hurts a little bit Pain Location: generalized with standing Pain Descriptors / Indicators: Grimacing, Guarding Pain Intervention(s): Limited activity within patient's tolerance, Monitored during session, Repositioned  Home Living                                          Prior Functioning/Environment              Frequency  Min 2X/week        Progress Toward Goals  OT Goals(current goals can now be found in the care plan section)  Progress towards OT goals: Progressing toward goals  Acute Rehab OT Goals Patient Stated Goal: unable OT Goal Formulation: Patient unable to participate in goal setting Time For Goal Achievement: 02/19/24 Potential to Achieve Goals: Good ADL Goals Pt Will Perform Grooming: with min assist;standing Pt Will Perform Lower Body Dressing: with mod assist;sit to/from stand Pt Will Transfer to Toilet: with mod assist;stand pivot transfer;bedside commode Additional ADL Goal #1: Pt to complete bed mobility with Supervision in prep for EOB/OOB ADLs  Plan      Co-evaluation                 AM-PAC OT 6 Clicks Daily Activity     Outcome Measure   Help from another person eating meals?: A Little Help from another person taking care of personal grooming?: A Little Help from another person toileting, which includes using toliet, bedpan, or urinal?: Total Help from another person bathing (including washing, rinsing, drying)?: A Lot Help from another person to put on and taking off regular upper body clothing?: A Lot Help from another person to put on and taking off regular lower body clothing?: Total 6 Click Score: 12    End of Session Equipment Utilized During Treatment: Gait belt;Other (comment) Laurent)  OT Visit Diagnosis: Unsteadiness on feet (R26.81);Other abnormalities of gait and  mobility (R26.89);Muscle weakness (generalized) (M62.81)   Activity Tolerance Patient tolerated treatment well   Patient Left in chair;with call bell/phone within reach;with chair alarm set;with nursing/sitter in room   Nurse Communication Mobility status;Need for lift equipment        Time: 9255-9186 OT Time Calculation (min): 29 min  Charges: OT General Charges $OT Visit: 1 Visit OT Treatments $Self Care/Home Management : 23-37 mins  Dick Laine, OTA Acute Rehabilitation Services  Office [phone]   Jeb LITTIE Laine 02/13/2024, 10:39 AM

## 2024-02-13 NOTE — TOC Progression Note (Signed)
 Transition of Care [facility]) - Progression Note    Patient Details  Name: Stephen Wilkins MRN: [id number] Date of Birth: 1927-10-17  Transition of Care [facility]) CM/SW Contact  Sherline Clack, [place] Phone Number: 02/13/2024, 4:16 PM  Clinical Narrative:     CSW spoke with patient, patient's wife, and caregiver at bedside regarding [employer] phone call CSW received today. Patient was denied for SNF at [facility] due to [facility] concluding patient needs long term care placement vs. SNF. CSW informed family that long term placement is not covered by insurance, and the family would need to pay out of pocket for placement. Patient's wife and caregiver asked CSW if nursing could come to the home. Nursing is able to come to the home but would have to be paid out of pocket because insurance will not cover it. Patient's wife requested CSW call their son to let him know of the insurance update. CSW left a VM for patient's son because he was unable to answer. CSW will continue to follow.    Expected Discharge Plan: Skilled Nursing Facility Barriers to Discharge: Insurance Authorization               Expected Discharge Plan and Services       Living arrangements for the past 2 months: Single Family Home                                       Social Drivers of Health (SDOH) Interventions SDOH Screenings   Food Insecurity: No Food Insecurity (02/04/2024)  Housing: Low Risk  (02/04/2024)  Transportation Needs: No Transportation Needs (02/04/2024)  Utilities: Not At Risk (02/04/2024)  Alcohol Screen: Low Risk  (10/02/2023)  Depression (PHQ2-9): Low Risk  (01/22/2024)  Financial Resource Strain: Low Risk  (10/02/2023)  Physical Activity: Inactive (10/02/2023)  Social Connections: Moderately Isolated (02/04/2024)  Stress: No Stress Concern Present (10/02/2023)  Tobacco Use: Medium Risk (02/03/2024)  Health Literacy: Adequate Health Literacy (10/02/2023)    Readmission Risk  Interventions    11/13/2022   10:22 AM  Readmission Risk Prevention Plan  Transportation Screening Complete  PCP or Specialist Appt within 5-7 Days Complete  Home Care Screening Complete  Medication Review (RN CM) Complete

## 2024-02-13 NOTE — Plan of Care (Signed)

## 2024-02-13 NOTE — Discharge Summary (Addendum)
 Physician Discharge Summary  Stephen Wilkins FMW:[id number] DOB: Apr 29, 1927 DOA: 02/03/2024  PCP: Norleen Lynwood ORN, MD  Admit date: 02/03/2024 Discharge date: 02/20/2024  Admitted From: Home Disposition:  SNF  Discharge Condition:Stable CODE STATUS: DNR Diet recommendation: regular  Brief/Interim Summary: Patient is a 89 year old male with history of advanced dementia, COPD, stroke, BPH, hypertension, hyperlipidemia, CKD stage IIIb, recent acute ischemic stroke who presented further evaluation of syncope.  Patient lives at home with 24-hour support, also wearing 30-day cardiac monitor at the stroke workup and discharge.  On plantation, cardiac monitor showed junctional rhythm at the time of syncope.  Patient admitted for further evaluation of syncope.  Cardiology also consulted.  Palliative care also consulted for goals of care.  PT recommending SNF on discharge.Medically stable for discharge whenever possible.   Following problems were addressed during the hospitalization:  Syncope with  junctional rhythm: Currently hemodynamically stable.  Cardiology was consulted after admission. He is not a candidate for pacemaker due to her advanced dementia.    History of stroke with residual left lower extremity weakness: On aspirin , Plavix , Crestor . no evidence of new focal neurological deficits   Dementia with behavioral disturbances: Continue delirium precaution, frequent reorientation.  Continue Aricept    Hypomagnesemia: Supplemented   CKD stage IIIb: Currently kidney function at baseline. Baseline creatinine around 2.5. Avoid nephrotoxins.   GERD: Continue PPI  Left knee osteoarthritis: X-ray of the left knee did not show any acute findings.  Given a dose of prednisone  11/17.  Does not complain of any pain in the left knee today.  Suspected UTI: Denies dysuria.  No leukocytosis or fever.  No abdominal pain.  Treated with a dose of fosfomycin on 11/18   Debility/deconditioning/goals of care:  CODE STATUS DNR.  Has advanced dementia.  Palliative care was following for goals of care.  No aggressive interventions as per family.  PT/OT advised skilled nursing facility for rehabilitation.   Discharge Diagnoses:  Principal Problem:   Syncope Active Problems:   Dementia (HCC)   COPD (chronic obstructive pulmonary disease) (HCC)   Normocytic anemia   Hypomagnesemia   Palliative care by specialist    Discharge Instructions  Discharge Instructions     Diet - low sodium heart healthy   Complete by: As directed    Discharge instructions   Complete by: As directed    1)Please take your medications as instructed 2)Do a CBC and BMP tests in a week   Increase activity slowly   Complete by: As directed       Allergies as of 02/20/2024       Reactions   Atorvastatin  Other (See Comments)   REACTION: myalgia   Doxazosin Mesylate Other (See Comments)   REACTION: dizziness   Hydrocodone  Bit-homatrop Mbr Other (See Comments)   anxiety   Hydrocodone  Anxiety        Medication List     STOP taking these medications    cephALEXin  500 MG capsule Commonly known as: KEFLEX        TAKE these medications    albuterol  (2.5 MG/3ML) 0.083% nebulizer solution Commonly known as: PROVENTIL  Take 3 mLs (2.5 mg total) by nebulization every 4 (four) hours as needed for wheezing or shortness of breath.   aspirin  EC 81 MG tablet Take 1 tablet (81 mg total) by mouth daily. Swallow whole.   clopidogrel  75 MG tablet Commonly known as: PLAVIX  Take 1 tablet (75 mg total) by mouth daily.   donepezil  5 MG tablet Commonly known as: ARICEPT  Take  5 mg by mouth daily.   finasteride  5 MG tablet Commonly known as: PROSCAR  Take 5 mg by mouth daily.   oxyCODONE 5 MG immediate release tablet Commonly known as: Oxy IR/ROXICODONE Take 1 tablet (5 mg total) by mouth every 6 (six) hours as needed for moderate pain (pain score 4-6) or severe pain (pain score 7-10) (left knee pain).    pantoprazole  40 MG tablet Commonly known as: PROTONIX  Take 1 tablet (40 mg total) by mouth daily.   polyethylene glycol powder 17 GM/SCOOP powder Commonly known as: MiraLax  Take 1/2 capful by mouth once daily   rosuvastatin  10 MG tablet Commonly known as: CRESTOR  Take 1 tablet (10 mg total) by mouth daily.   solifenacin  5 MG tablet Commonly known as: VESICARE  Take 1 tablet (5 mg total) by mouth daily.   tamsulosin  0.4 MG Caps capsule Commonly known as: FLOMAX  Take 1 capsule by mouth at bedtime   Trelegy Ellipta  100-62.5-25 MCG/ACT Aepb Generic drug: Fluticasone -Umeclidin-Vilant Inhale 1 puff into the lungs daily.        Contact information for after-discharge care     Destination     [facility] [place] .   Service: Skilled Nursing Contact information: [address] [place]  [phone]                    Allergies  Allergen Reactions   Atorvastatin  Other (See Comments)    REACTION: myalgia   Doxazosin Mesylate Other (See Comments)    REACTION: dizziness   Hydrocodone  Bit-Homatrop Mbr Other (See Comments)    anxiety   Hydrocodone  Anxiety    Consultations: Cardiology, palliative care   Procedures/Studies: DG Knee 1-2 Views Left Result Date: 02/20/2024 EXAM: 1 or 2 VIEW(S) XRAY OF THE LEFT KNEE 02/19/2024 01:04:00 PM COMPARISON: None available. CLINICAL HISTORY: 9965 Edema 9965 FINDINGS: BONES AND JOINTS: No acute fracture. No focal osseous lesion. No joint dislocation. No effusion. Minimal tricompartmental degenerative changes. SOFT TISSUES: Advanced Vascular calcifications. IMPRESSION: 1. No acute findings. 2. Minimal tricompartmental degenerative changes. Electronically signed by: Dorethia Molt MD 02/20/2024 01:01 AM EST RP Workstation: [id number]   DG Chest Port 1 View Result Date: 02/03/2024 EXAM: 1 VIEW(S) XRAY OF THE CHEST 02/03/2024 11:53:00 PM COMPARISON: 01/20/2024 CLINICAL HISTORY: syncope  FINDINGS: LUNGS AND PLEURA: Mild hyperinflation. No focal pulmonary opacity. No pulmonary edema. No pleural effusion. No pneumothorax. HEART AND MEDIASTINUM: Aortic atherosclerosis. No acute abnormality of the cardiac and mediastinal silhouettes. BONES AND SOFT TISSUES: No acute osseous abnormality. IMPRESSION: 1. No acute cardiopulmonary process. 2. Mild hyperinflation. Electronically signed by: Franky Crease MD 02/03/2024 11:57 PM EDT RP Workstation: [id number]      Subjective: Patient seen and examined at the bedside today.  Very comfortable.  Sitting on the chair.  No new complaints.  Alert and awake.  Oriented to place.  Not in any kind of distress.  Medically stable for discharge to SNF whenever possible  Discharge Exam: Vitals:   02/20/24 0501 02/20/24 0811  BP: (!) 140/93 (!) 147/77  Pulse: 92 87  Resp:  18  Temp: 97.9 F (36.6 C) 98.1 F (36.7 C)  SpO2: 98% 93%   Vitals:   02/20/24 0119 02/20/24 0125 02/20/24 0501 02/20/24 0811  BP: 130/83 130/83 (!) 140/93 (!) 147/77  Pulse: 91 92 92 87  Resp:    18  Temp:  98.1 F (36.7 C) 97.9 F (36.6 C) 98.1 F (36.7 C)  TempSrc:      SpO2:  98%  98% 93%  Weight:      Height:        General: Pt is alert, awake, not in acute distress,pleasant confused elderly male Cardiovascular: RRR, S1/S2 +, no rubs, no gallops Respiratory: CTA bilaterally, no wheezing, no rhonchi Abdominal: Soft, NT, ND, bowel sounds + Extremities: no edema, no cyanosis    The results of significant diagnostics from this hospitalization (including imaging, microbiology, ancillary and laboratory) are listed below for reference.     Microbiology: No results found for this or any previous visit (from the past 240 hours).   Labs: BNP (last 3 results) Recent Labs    04/27/23 0729 02/03/24 2311  BNP 61.2 71.7   Basic Metabolic Panel: Recent Labs  Lab 02/14/24 0230 02/17/24 0530 02/20/24 0805  NA 136 133* 135  K 5.0 4.9 5.1  CL 101 101 97*  CO2  24 23 22   GLUCOSE 121* 103* 134*  BUN 67* 57* 64*  CREATININE 2.61* 2.25* 2.27*  CALCIUM  9.1 9.0 9.7   Liver Function Tests: No results for input(s): AST, ALT, ALKPHOS, BILITOT, PROT, ALBUMIN  in the last 168 hours. No results for input(s): LIPASE, AMYLASE in the last 168 hours. No results for input(s): AMMONIA in the last 168 hours. CBC: Recent Labs  Lab 02/17/24 0530 02/20/24 0805  WBC 9.0 13.7*  HGB 10.2* 11.0*  HCT 32.1* 33.0*  MCV 95.0 91.4  PLT 370 424*   Cardiac Enzymes: No results for input(s): CKTOTAL, CKMB, CKMBINDEX, TROPONINI in the last 168 hours. BNP: Invalid input(s): POCBNP CBG: Recent Labs  Lab 02/16/24 1427  GLUCAP 114*   D-Dimer No results for input(s): DDIMER in the last 72 hours. Hgb A1c No results for input(s): HGBA1C in the last 72 hours. Lipid Profile No results for input(s): CHOL, HDL, LDLCALC, TRIG, CHOLHDL, LDLDIRECT in the last 72 hours. Thyroid  function studies No results for input(s): TSH, T4TOTAL, T3FREE, THYROIDAB in the last 72 hours.  Invalid input(s): FREET3 Anemia work up No results for input(s): VITAMINB12, FOLATE, FERRITIN, TIBC, IRON, RETICCTPCT in the last 72 hours. Urinalysis    Component Value Date/Time   COLORURINE YELLOW 02/19/2024 1500   APPEARANCEUR HAZY (A) 02/19/2024 1500   LABSPEC 1.013 02/19/2024 1500   PHURINE 5.0 02/19/2024 1500   GLUCOSEU NEGATIVE 02/19/2024 1500   GLUCOSEU NEGATIVE 01/23/2024 1019   HGBUR NEGATIVE 02/19/2024 1500   BILIRUBINUR NEGATIVE 02/19/2024 1500   KETONESUR NEGATIVE 02/19/2024 1500   PROTEINUR 30 (A) 02/19/2024 1500   UROBILINOGEN 0.2 01/23/2024 1019   NITRITE NEGATIVE 02/19/2024 1500   LEUKOCYTESUR LARGE (A) 02/19/2024 1500   Sepsis Labs Recent Labs  Lab 02/17/24 0530 02/20/24 0805  WBC 9.0 13.7*   Microbiology No results found for this or any previous visit (from the past 240 hours).  Please note: You  were cared for by a hospitalist during your hospital stay. Once you are discharged, your primary care physician will handle any further medical issues. Please note that NO REFILLS for any discharge medications will be authorized once you are discharged, as it is imperative that you return to your primary care physician (or establish a relationship with a primary care physician if you do not have one) for your post hospital discharge needs so that they can reassess your need for medications and monitor your lab values.    Time coordinating discharge: 40 minutes  SIGNED:   Ivonne Mustache, MD  [facility] 02/20/2024, 9:18 AM Pager [phone]  If 7PM-7AM, please contact night-coverage [email or web address] Password [email or web address]

## 2024-02-13 NOTE — Progress Notes (Signed)
 Daily Progress Note   Date: 02/13/2024   Patient Name: Stephen Wilkins  DOB: December 26, 1927  MRN: [id number]  Age / Sex: 89 y.o., male  Attending Physician: Jillian Buttery, MD Primary Care Physician: Norleen Lynwood ORN, MD Admit Date: 02/03/2024 Length of Stay: 8 days  Reason for Follow-up: Establishing goals of care  Past Medical History:  Diagnosis Date   ABNORMAL ELECTROCARDIOGRAM 06/21/2007   ALLERGIC RHINITIS 03/23/2007   Anxiety state, unspecified 09/06/2013   ASTHMATIC BRONCHITIS, ACUTE 04/27/2007   BENIGN PROSTATIC HYPERTROPHY 03/23/2007   BRONCHITIS NOT SPECIFIED AS ACUTE OR CHRONIC 04/21/2007   BURSITIS, RIGHT HIP 07/31/2008   CKD (chronic kidney disease)    COPD (chronic obstructive pulmonary disease) (HCC) 04/19/2016   Cough 01/29/2009   Dementia (HCC) 09/01/2010   ERECTILE DYSFUNCTION 03/23/2007   Esophageal stricture    Falls frequently    FREQUENCY, URINARY 04/26/2010   GERD 03/23/2007   HIATAL HERNIA    HYPERLIPIDEMIA 03/23/2007   HYPERTENSION 03/23/2007   MILD COGNITIVE IMPAIRMENT SO STATED 05/19/2010   NEPHROLITHIASIS, HX OF 03/23/2007   Nocturnal confusion 12/04/2023   Daughter states sun downs at night   PSA, INCREASED 06/27/2008   REACTIVE AIRWAY DISEASE 01/14/2010   SCHATZKI'S RING    SCIATICA, RIGHT 04/28/2008   Unspecified hypothyroidism 09/06/2013   Vascular dementia (HCC)     Subjective:   Subjective: Chart Reviewed. Updates received. Patient Assessed. Created space and opportunity for patient  and family to explore thoughts and feelings regarding current medical situation.  Today's Discussion: Today before meeting with the patient/family, I reviewed the chart notes including nursing note from yesterday, TOC note from yesterday, internal medicine note from yesterday, PT note from yesterday, nursing notes from today. I also reviewed vital signs, nursing flowsheets, medication administrations record, labs, and imaging. Labs reviewed include BMP which  shows stable creatinine at 2.47 today in the setting of CKD 3, remained stable and within baseline.  Today saw the patient at the bedside, no family was present.  However, his personal caregiver was present.  The patient was awake and sitting up in a bedside chair with his glasses on.  He is quite alert and greets me as I enter the room, asked me how I am doing.  He states he is having a good day, caregiver agrees.  He denies pain, nausea, vomiting.  States he ate a good breakfast this morning.  I examined his tray and he ate them near entirety of his breakfast other than the grapes which she states were not very good.  We talked about the plan for going to rehab to try to get stronger to help him thrive at home.  30 to my visit the hospitalist entered the room and I excused myself for their visit.  I provided emotional and general support through therapeutic listening, empathy, sharing of stories, and other techniques. I answered all questions and addressed all concerns to the best of my ability.  Review of Systems  Constitutional:        Denies pain in general  Respiratory:  Negative for shortness of breath.   Cardiovascular:  Negative for chest pain.  Gastrointestinal:  Negative for abdominal pain, nausea and vomiting.    Objective:   Primary Diagnoses: Present on Admission:  Syncope  Dementia (HCC)  COPD (chronic obstructive pulmonary disease) (HCC)   Vital Signs:  BP (!) 143/83 (BP Location: Right Arm)   Pulse 85   Temp (!) 97.5 F (36.4 C)   Resp 18  Ht 6' (1.829 m)   Wt 83.2 kg   SpO2 99%   BMI 24.88 kg/m   Physical Exam Vitals and nursing note reviewed.  Constitutional:      General: He is not in acute distress.    Appearance: He is ill-appearing.  HENT:     Head: Normocephalic and atraumatic.  Pulmonary:     Effort: Pulmonary effort is normal. No respiratory distress.  Abdominal:     General: Abdomen is flat.  Skin:    General: Skin is warm and dry.   Neurological:     Mental Status: He is alert. He is disoriented.     Comments: Oriented to person and time; disoriented to place and situation  Psychiatric:        Mood and Affect: Mood normal.        Behavior: Behavior normal.     Palliative Assessment/Data: 60%   Existing Vynca/ACP Documentation: None  Assessment & Plan:   HPI/Patient Profile:  89 y.o. male  with past medical history of advanced dementia, COPD, stroke, BPH, hypertension, hyperlipidemia, CKD stage IIIb, recent diagnosis of acute ischemic stroke presented from home for evaluation of syncope.  She was admitted on 02/03/2024 with syncope with junctional rhythm, history of CVA with residual left lower extremity weakness, dementia with behavioral disturbances, CKD 3B, and others.   SUMMARY OF RECOMMENDATIONS   DNR-Limited Continue current scope of care Plan for SNF/rehab at discharge with outpatient palliative care through [facility] [place] (care connections)  Goals are quite clear at this time Hospitalist indicates possible bed at rehab today Palliative medicine will back off given that goals are clear and plan in place Please notify us  of any significant change/new palliative needs  Symptom Management:  Per primary team Palliative medicine is available to assist as needed  Code Status: DNR - Limited (DNR/DNI)  Prognosis: Unable to determine  Discharge Planning: Skilled Nursing Facility for rehab with Palliative care service follow-up  Discussed with: Patient, caregiver, medical team, nursing team, [facility] team  Thank you for allowing us  to participate in the care of PABLO STAUFFER PMT will continue to support holistically.  Billing based on MDM: Moderate  Detailed review of medical records (labs, imaging, vital signs), medically appropriate exam, discussed with treatment team, counseling and education to patient, family, & staff, documenting clinical information, medication management, coordination of  care  Camellia Kays, NP Palliative Medicine Team  Team Phone # [phone] (Nights/Weekends)  12/01/2020, 8:17 AM

## 2024-02-13 NOTE — Plan of Care (Signed)
  Problem: Education: Goal: Knowledge of General Education information will improve Description: Including pain rating scale, medication(s)/side effects and non-pharmacologic comfort measures Outcome: Progressing   Problem: Health Behavior/Discharge Planning: Goal: Ability to manage health-related needs will improve Outcome: Progressing   Problem: Clinical Measurements: Goal: Ability to maintain clinical measurements within normal limits will improve Outcome: Progressing Goal: Will remain free from infection Outcome: Progressing Goal: Diagnostic test results will improve Outcome: Progressing Goal: Respiratory complications will improve Outcome: Progressing Goal: Cardiovascular complication will be avoided Outcome: Progressing   Problem: Activity: Goal: Risk for activity intolerance will decrease Outcome: Progressing   Problem: Elimination: Goal: Will not experience complications related to bowel motility Outcome: Progressing Goal: Will not experience complications related to urinary retention Outcome: Progressing   Problem: Pain Managment: Goal: General experience of comfort will improve and/or be controlled Outcome: Progressing   Problem: Skin Integrity: Goal: Risk for impaired skin integrity will decrease Outcome: Progressing

## 2024-02-14 DIAGNOSIS — R55 Syncope and collapse: Secondary | ICD-10-CM | POA: Diagnosis not present

## 2024-02-14 LAB — BASIC METABOLIC PANEL WITH GFR
Anion gap: 11 (ref 5–15)
BUN: 67 mg/dL — ABNORMAL HIGH (ref 8–23)
CO2: 24 mmol/L (ref 22–32)
Calcium: 9.1 mg/dL (ref 8.9–10.3)
Chloride: 101 mmol/L (ref 98–111)
Creatinine, Ser: 2.61 mg/dL — ABNORMAL HIGH (ref 0.61–1.24)
GFR, Estimated: 22 mL/min — ABNORMAL LOW (ref >60)
Glucose, Bld: 121 mg/dL — ABNORMAL HIGH (ref 70–99)
Potassium: 5 mmol/L (ref 3.5–5.1)
Sodium: 136 mmol/L (ref 135–145)

## 2024-02-14 NOTE — Progress Notes (Signed)
 PT Cancellation Note  Patient Details Name: DETRELL UMSCHEID MRN: [id number] DOB: 23-Apr-1927   Cancelled Treatment:    Reason Eval/Treat Not Completed: (P) Other (comment) Pt is eating lunch requests I stop back later. PT will follow back as able.  Zayla Agar B. Fleeta Lapidus PT, DPT Acute Rehabilitation Services Please use secure chat or  Call Office [phone]    Almarie KATHEE Fleeta [facility] 02/14/2024, 11:27 AM

## 2024-02-14 NOTE — Plan of Care (Signed)

## 2024-02-14 NOTE — Progress Notes (Signed)
 PROGRESS NOTE  Stephen Wilkins  FMW:[id number] DOB: 12/30/1927 DOA: 02/03/2024 PCP: Norleen Lynwood ORN, MD   Brief Narrative: Patient is a 89 year old male with history of advanced dementia, COPD, stroke, BPH, hypertension, hyperlipidemia, CKD stage IIIb, recent acute ischemic stroke who presented further evaluation of syncope. Patient lives at home with 24-hour support, also wearing 30-day cardiac monitor at the stroke workup and discharge. On plantation, cardiac monitor showed junctional rhythm at the time of syncope. Patient admitted for further evaluation of syncope. Cardiology also consulted. Palliative care also consulted for goals of care. PT recommending SNF on discharge .  Insurance declined even after peer to peer review.  Waiting for placement.  TOC following  Assessment & Plan:  Principal Problem:   Syncope Active Problems:   Dementia (HCC)   COPD (chronic obstructive pulmonary disease) (HCC)   Normocytic anemia   Hypomagnesemia   Palliative care by specialist   Syncope with  junctional rhythm: Currently hemodynamically stable.  Cardiology was consulted after admission. He is not a candidate for pacemaker due to her advanced dementia.    History of stroke with residual left lower extremity weakness: On aspirin , Plavix , Crestor . no evidence of new focal neurological deficits   Dementia with behavioral disturbances: Continue delirium precaution, frequent reorientation.  Continue Aricept    Hypomagnesemia: Supplemented   CKD stage IIIb: Currently kidney function at baseline. Baseline creatinine around 2.5. Avoid nephrotoxins.   GERD: Continue PPI   Debility/deconditioning/goals of care: CODE STATUS DNR.  Has advanced dementia.  Palliative care was following for goals of care.  No aggressive interventions as per family.  PT/OT advised skilled nursing facility for rehabilitation.  Insurance  has declined.  TOC following         DVT prophylaxis:heparin injection 5,000 Units  Start: 02/04/24 1400     Code Status: Limited: Do not attempt resuscitation (DNR) -DNR-LIMITED -Do Not Intubate/DNI   Family Communication: Caregiver at bedside on 11/12  Patient status:Inpatient  Patient is from :Home  Anticipated discharge to:SNF  Estimated DC date:whenever possible   Consultants: Palliative care  Procedures:None  Antimicrobials:  Anti-infectives (From admission, onward)    None       Subjective: Patient seen and examined at bedside today.  Hemodynamically stable.  Sitting  comfortably on the chair.  Eating his breakfast.  Denies any complaints.  Oriented to place  Objective: Vitals:   02/13/24 1702 02/13/24 2018 02/14/24 0735 02/14/24 0900  BP: (!) 153/85 (!) 155/76 90/73 108/80  Pulse: 93 94 (!) 119 96  Resp: 20  19   Temp: 98.2 F (36.8 C) 98.3 F (36.8 C) (!) 97.5 F (36.4 C)   TempSrc:      SpO2: 97% 95% 95%   Weight:      Height:       No intake or output data in the 24 hours ending 02/14/24 1050  Filed Weights   02/09/24 2150  Weight: 83.2 kg    Examination:   General exam: Overall comfortable, not in distress, pleasantly confused elderly gentleman HEENT: PERRL Respiratory system:  no wheezes or crackles  Cardiovascular system: S1 & S2 heard, RRR.  Gastrointestinal system: Abdomen is nondistended, soft and nontender. Central nervous system: Alert and oriented to place only Extremities: No edema, no clubbing ,no cyanosis Skin: No rashes, no ulcers,no icterus     Data Reviewed: I have personally reviewed following labs and imaging studies  CBC: Recent Labs  Lab 02/08/24 0148 02/09/24 0508 02/10/24 0208  WBC 10.1 10.8* 8.7  HGB 9.0* 9.8* 9.4*  HCT 28.1* 30.7* 29.3*  MCV 95.3 96.2 95.8  PLT 279 327 312   Basic Metabolic Panel: Recent Labs  Lab 02/09/24 0508 02/10/24 0208 02/12/24 0423 02/13/24 0259 02/14/24 0230  NA 135 136 134* 138 136  K 5.5* 5.2* 4.8 5.0 5.0  CL 101 100 101 102 101  CO2 22 22 24 25 24    GLUCOSE 104* 108* 107* 110* 121*  BUN 53* 57* 60* 65* 67*  CREATININE 2.44* 2.33* 2.51* 2.47* 2.61*  CALCIUM  8.7* 9.1 9.0 9.3 9.1     No results found for this or any previous visit (from the past 240 hours).   Radiology Studies: No results found.  Scheduled Meds:  aspirin  EC  81 mg Oral Daily   budesonide -glycopyrrolate -formoterol   2 puff Inhalation BID   clopidogrel   75 mg Oral Daily   docusate sodium   100 mg Oral BID   donepezil   5 mg Oral Daily   fesoterodine  4 mg Oral Daily   finasteride   5 mg Oral Daily   heparin  5,000 Units Subcutaneous Q8H   pantoprazole   40 mg Oral Daily   polyethylene glycol  17 g Oral Daily   rosuvastatin   10 mg Oral Daily   senna  2 tablet Oral QHS   tamsulosin   0.4 mg Oral QHS   Continuous Infusions:   LOS: 9 days   Ivonne Mustache, MD [facility] P11/03/2024, 10:50 AM

## 2024-02-14 NOTE — Plan of Care (Signed)
   Problem: Education: Goal: Knowledge of General Education information will improve Description: Including pain rating scale, medication(s)/side effects and non-pharmacologic comfort measures Outcome: Progressing   Problem: Health Behavior/Discharge Planning: Goal: Ability to manage health-related needs will improve Outcome: Progressing   Problem: Clinical Measurements: Goal: Ability to maintain clinical measurements within normal limits will improve Outcome: Progressing Goal: Will remain free from infection Outcome: Progressing Goal: Diagnostic test results will improve Outcome: Progressing Goal: Respiratory complications will improve Outcome: Progressing Goal: Cardiovascular complication will be avoided Outcome: Progressing   Problem: Coping: Goal: Level of anxiety will decrease Outcome: Progressing   Problem: Elimination: Goal: Will not experience complications related to bowel motility Outcome: Progressing Goal: Will not experience complications related to urinary retention Outcome: Progressing   Problem: Safety: Goal: Ability to remain free from injury will improve Outcome: Progressing   Problem: Skin Integrity: Goal: Risk for impaired skin integrity will decrease Outcome: Progressing

## 2024-02-14 NOTE — TOC Progression Note (Signed)
 Transition of Care [facility]) - Progression Note    Patient Details  Name: Stephen Wilkins MRN: [id number] Date of Birth: 05/25/1927  Transition of Care [facility]) CM/SW Contact  Sherline Clack, [place] Phone Number: 02/14/2024, 11:46 AM  Clinical Narrative:     CSW spoke with daughter Stephen Wilkins regarding long term placement vs home with home heath following patient's insurance denial. LuAnn stated the family is interested in long term care placement and requested CSW look into [facility]. CSW reached out to [facility], LTC social worker [phone]) to ask about bed availability. CSW also shared Edsel Meaux's number with daughter for any additional help looking for placement. CSW will continue to follow.   Expected Discharge Plan: Skilled Nursing Facility Barriers to Discharge: Insurance Authorization               Expected Discharge Plan and Services       Living arrangements for the past 2 months: Single Family Home                                       Social Drivers of Health (SDOH) Interventions SDOH Screenings   Food Insecurity: No Food Insecurity (02/04/2024)  Housing: Low Risk  (02/04/2024)  Transportation Needs: No Transportation Needs (02/04/2024)  Utilities: Not At Risk (02/04/2024)  Alcohol Screen: Low Risk  (10/02/2023)  Depression (PHQ2-9): Low Risk  (01/22/2024)  Financial Resource Strain: Low Risk  (10/02/2023)  Physical Activity: Inactive (10/02/2023)  Social Connections: Moderately Isolated (02/04/2024)  Stress: No Stress Concern Present (10/02/2023)  Tobacco Use: Medium Risk (02/03/2024)  Health Literacy: Adequate Health Literacy (10/02/2023)    Readmission Risk Interventions    11/13/2022   10:22 AM  Readmission Risk Prevention Plan  Transportation Screening Complete  PCP or Specialist Appt within 5-7 Days Complete  Home Care Screening Complete  Medication Review (RN CM) Complete

## 2024-02-15 ENCOUNTER — Telehealth (HOSPITAL_COMMUNITY): Payer: Self-pay

## 2024-02-15 ENCOUNTER — Other Ambulatory Visit (HOSPITAL_COMMUNITY): Payer: Self-pay

## 2024-02-15 NOTE — Telephone Encounter (Signed)
 This has been handled since Pt was admitted into the hospital.

## 2024-02-15 NOTE — Plan of Care (Signed)

## 2024-02-15 NOTE — TOC Progression Note (Signed)
 Transition of Care [facility]) - Progression Note    Patient Details  Name: Stephen Wilkins MRN: [id number] Date of Birth: 1927/10/07  Transition of Care [facility]) CM/SW Contact  Sherline Clack, [place] Phone Number: 02/15/2024, 4:37 PM  Clinical Narrative:     CSW reached out to [facility] regarding long term placement. [facility] informed family patient would need to demonstrate he has at least 275,000 in liquid assets and the family would pay $11,500/month. Family agreeable due to not being able to have patient at home with 24/7 private nursing. Unfortunately, after review by facility, facility is not able to offer a long term bed.   Family refusing to work with [facility] [employer] at this time. CSW will continue to follow and come up with safe discharge plan.   Expected Discharge Plan: Skilled Nursing Facility Barriers to Discharge: Insurance Authorization               Expected Discharge Plan and Services       Living arrangements for the past 2 months: Single Family Home                                       Social Drivers of Health (SDOH) Interventions SDOH Screenings   Food Insecurity: No Food Insecurity (02/04/2024)  Housing: Low Risk  (02/04/2024)  Transportation Needs: No Transportation Needs (02/04/2024)  Utilities: Not At Risk (02/04/2024)  Alcohol Screen: Low Risk  (10/02/2023)  Depression (PHQ2-9): Low Risk  (01/22/2024)  Financial Resource Strain: Low Risk  (10/02/2023)  Physical Activity: Inactive (10/02/2023)  Social Connections: Moderately Isolated (02/04/2024)  Stress: No Stress Concern Present (10/02/2023)  Tobacco Use: Medium Risk (02/03/2024)  Health Literacy: Adequate Health Literacy (10/02/2023)    Readmission Risk Interventions    11/13/2022   10:22 AM  Readmission Risk Prevention Plan  Transportation Screening Complete  PCP or Specialist Appt within 5-7 Days Complete  Home Care Screening Complete  Medication Review (RN CM) Complete

## 2024-02-15 NOTE — Progress Notes (Signed)
 Physical Therapy Treatment Patient Details Name: Stephen Wilkins MRN: [id number] DOB: 1927-11-22 Today's Date: 02/15/2024   History of Present Illness Pt is a 89 y/o male presenting after syncopal episode at home. Cardiology following and recommend conservative mgmt. Recent admission 9/1-9/5 for acute CVA, syncope and AKI. PMH: hypertension, hyperlipidemia, GERD, dementia, COPD, anxiety, hypothyroidism, esophageal stricture, CKD, recent fall, GI bleed and thoracic aortic aneurysm.    PT Comments  Pt received in supine and agreeable to session. Pt requires grossly mod A +2 for bed mobility and transfers due to weakness. Pt demonstrates impaired problem solving and command following requiring increased cues throughout session. Pt able to tolerate multiple standing trials, but demonstrates limited standing tolerance. Pt able to minimally elevate BLE with stedy support, but demonstrates increased flexed posture. Noted increased LLE tone with knee flexion and ankle inversion and pt unable to place foot flat during standing. Pt continues to benefit from PT services to progress toward functional mobility goals.    If plan is discharge home, recommend the following: Two people to help with walking and/or transfers;A lot of help with bathing/dressing/bathroom;Assist for transportation;Assistance with cooking/housework;Supervision due to cognitive status   Can travel by private vehicle     No  Equipment Recommendations  None recommended by PT    Recommendations for Other Services       Precautions / Restrictions Precautions Precautions: Fall Recall of Precautions/Restrictions: Impaired Precaution/Restrictions Comments: hx of syncope Restrictions Weight Bearing Restrictions Per Provider Order: No     Mobility  Bed Mobility Overal bed mobility: Needs Assistance Bed Mobility: Supine to Sit     Supine to sit: Mod assist, +2 for physical assistance     General bed mobility comments: assistance  with BLEs and raising trunk.  Bed pad used to assist with scooting towards EOB. Increased cues for sequencing and technique    Transfers Overall transfer level: Needs assistance Equipment used: Ambulation equipment used Transfers: Sit to/from Stand, Bed to chair/wheelchair/BSC Sit to Stand: Mod assist, +2 physical assistance           General transfer comment: STS with mod A +2 from EOB and recliner. Cues for upright posture and B knee extension with LLE tone noted and pt unable to place foot flat Transfer via Lift Equipment: Stedy  Ambulation/Gait             Pre-gait activities: weight shifting and Artist Bed    Modified Rankin (Stroke Patients Only)       Balance Overall balance assessment: Needs assistance Sitting-balance support: No upper extremity supported, Feet supported Sitting balance-Leahy Scale: Fair Sitting balance - Comments: CGA EOB   Standing balance support: Bilateral upper extremity supported, Reliant on assistive device for balance Standing balance-Leahy Scale: Poor Standing balance comment: reliant on Stedy for support                            Communication Communication Communication: Impaired Factors Affecting Communication: Hearing impaired  Cognition Arousal: Alert Behavior During Therapy: Flat affect, WFL for tasks assessed/performed   PT - Cognitive impairments: History of cognitive impairments                         Following commands: Impaired Following commands impaired: Follows one step commands with increased time, Follows one  step commands inconsistently    Cueing Cueing Techniques: Verbal cues, Tactile cues, Gestural cues  Exercises General Exercises - Lower Extremity Long Arc Quad: AROM, Both, Seated, 5 reps    General Comments        Pertinent Vitals/Pain Pain Assessment Pain Assessment: Faces Faces Pain Scale: Hurts a little  bit Pain Location: generalized with standing Pain Descriptors / Indicators: Grimacing, Guarding Pain Intervention(s): Limited activity within patient's tolerance, Monitored during session, Repositioned     PT Goals (current goals can now be found in the care plan section) Acute Rehab PT Goals Patient Stated Goal: to go to rehab PT Goal Formulation: With patient/family Time For Goal Achievement: 02/20/24 Progress towards PT goals: Progressing toward goals    Frequency    Min 2X/week       AM-PAC PT 6 Clicks Mobility   Outcome Measure  Help needed turning from your back to your side while in a flat bed without using bedrails?: A Lot Help needed moving from lying on your back to sitting on the side of a flat bed without using bedrails?: A Lot Help needed moving to and from a bed to a chair (including a wheelchair)?: Total Help needed standing up from a chair using your arms (e.g., wheelchair or bedside chair)?: Total Help needed to walk in hospital room?: Total Help needed climbing 3-5 steps with a railing? : Total 6 Click Score: 8    End of Session Equipment Utilized During Treatment: Gait belt Activity Tolerance: Patient limited by fatigue Patient left: with call bell/phone within reach;with family/visitor present;in chair;with chair alarm set Nurse Communication: Mobility status PT Visit Diagnosis: Other abnormalities of gait and mobility (R26.89);History of falling (Z91.81);Other symptoms and signs involving the nervous system (R29.898)     Time: 9093-9076 PT Time Calculation (min) (ACUTE ONLY): 17 min  Charges:    $Therapeutic Activity: 8-22 mins PT General Charges $$ ACUTE PT VISIT: 1 Visit                    Darryle George, PTA Acute Rehabilitation Services Secure Chat Preferred  [phone]    Darryle George 02/15/2024, 10:04 AM

## 2024-02-15 NOTE — Progress Notes (Signed)
 Mobility Specialist: Progress Note   02/15/24 1428  Mobility  Activity Pivoted/transferred from chair to bed  Level of Assistance +2 (takes two people)  Assistive Device Stedy  Activity Response Tolerated well  Mobility Referral Yes  Mobility visit 1 Mobility  Mobility Specialist Start Time (ACUTE ONLY) 1325  Mobility Specialist Stop Time (ACUTE ONLY) 1336  Mobility Specialist Time Calculation (min) (ACUTE ONLY) 11 min    Pt received in chair. ModA +2 for STS from chair. Transferred back to bed via stedy. Heavy minA+2 for STS from stedy. MaxA for bed mobility. Left in supine with all needs met, call bell in reach.   Ileana Lute Mobility Specialist Please contact via SecureChat or Rehab office at [phone]

## 2024-02-15 NOTE — Telephone Encounter (Signed)
 Pharmacy Patient Advocate Encounter  Insurance verification completed.    The patient is insured through [employer]/ [employer]. Patient has Medicare and is not eligible for a copay card, but may be able to apply for patient assistance or Medicare RX Payment Plan (Patient Must reach out to their plan, if eligible for payment plan), if available.    Ran test claim for Breztri  [id number] and the current 30 day co-pay is $47.   This test claim was processed through [facility]- copay amounts may vary at other pharmacies due to pharmacy/plan contracts, or as the patient moves through the different stages of their insurance plan.

## 2024-02-15 NOTE — Progress Notes (Signed)
 Patient seen and examined at the bedside today.  Hemodynamically stable, lying on bed.  No new complains. Oriented to place.Sitter at bedside.  No new change in medical management.  Waiting for nursing facility placement.  Discharge summary and orders are already in place.TOC following

## 2024-02-16 LAB — GLUCOSE, CAPILLARY: Glucose-Capillary: 114 mg/dL — ABNORMAL HIGH (ref 70–99)

## 2024-02-16 NOTE — Progress Notes (Signed)
 Patient seen and examined at bedside today.  Hemodynamically stable.  Sitting on the chair.  Eating his breakfast.  Not in any Distress.  Medically stable for discharge to SNF whenever possible.  Family looking for long-term placement.  TOC following.  No new change in the medical management.  Discharge summary and orders are already in place.

## 2024-02-16 NOTE — Plan of Care (Signed)

## 2024-02-16 NOTE — TOC Progression Note (Addendum)
 Transition of Care [facility]) - Progression Note    Patient Details  Name: RAYMIR FROMMELT MRN: [id number] Date of Birth: January 12, 1928  Transition [facility]) CM/SW Contact  Sherline Clack, [place] Phone Number: 02/16/2024, 11:46 AM  Clinical Narrative:     Update 4:39 PM: Family is in contact with Clotilda from [facility] and has received application for facility and long term care placement via email. CSW will continue to follow as needs arise.   CSW reached out to [facility], [place], and [place] for longterm care bed availability. [facility] and [facility] currently do not have beds open for long term. [facility] is accepting private pay for long term. CSW sent family financial breakdown for longterm care: $12,450 up front and $415/day. Family requesting CSW continue to search for placement. CSW will reach out to [facility] and [facility]. CSW will continue to follow.   Expected Discharge Plan: Skilled Nursing Facility Barriers to Discharge: Insurance Authorization               Expected Discharge Plan and Services       Living arrangements for the past 2 months: Single Family Home                                       Social Drivers of Health (SDOH) Interventions SDOH Screenings   Food Insecurity: No Food Insecurity (02/04/2024)  Housing: Low Risk  (02/04/2024)  Transportation Needs: No Transportation Needs (02/04/2024)  Utilities: Not At Risk (02/04/2024)  Alcohol Screen: Low Risk  (10/02/2023)  Depression (PHQ2-9): Low Risk  (01/22/2024)  Financial Resource Strain: Low Risk  (10/02/2023)  Physical Activity: Inactive (10/02/2023)  Social Connections: Moderately Isolated (02/04/2024)  Stress: No Stress Concern Present (10/02/2023)  Tobacco Use: Medium Risk (02/03/2024)  Health Literacy: Adequate Health Literacy (10/02/2023)    Readmission Risk Interventions    11/13/2022   10:22 AM  Readmission Risk Prevention Plan  Transportation Screening  Complete  PCP or Specialist Appt within 5-7 Days Complete  Home Care Screening Complete  Medication Review (RN CM) Complete

## 2024-02-16 NOTE — Progress Notes (Signed)
 Mobility Specialist: Progress Note   02/16/24 1400  Mobility  Activity Mechanically lifted from chair to bed  Level of Assistance Moderate assist, patient does 50-74%  Assistive Device Stedy  Activity Response Tolerated well  Mobility Referral Yes  Mobility visit 1 Mobility  Mobility Specialist Start Time (ACUTE ONLY) 1335  Mobility Specialist Stop Time (ACUTE ONLY) 1350  Mobility Specialist Time Calculation (min) (ACUTE ONLY) 15 min    Pt received trying to get out of the recliner chair, pleasant confused and requesting assistance to the BR. Heavy modA for STS from chair. Transferred back to bed via stedy. ModA for STS from stedy. BM occurred while in standing, maxA for bed mobility to roll and perform pericare. Pt provided with linen and gown change as well and placed on bed pan for an additional BM. Left on bed pan with all needs met, call bell in reach. Bed alarm on, NT aware.   Ileana Lute Mobility Specialist Please contact via SecureChat or Rehab office at [phone]

## 2024-02-16 NOTE — Progress Notes (Signed)
 Daily Progress Note   Date: 02/16/2024   Patient Name: Stephen Wilkins  DOB: Jul 30, 1927  MRN: [id number]  Age / Sex: 89 y.o., male  Attending Physician: Jillian Buttery, MD Primary Care Physician: Norleen Lynwood ORN, MD Admit Date: 02/03/2024 Length of Stay: 11 days  Reason for Follow-up: Establishing goals of care  Past Medical History:  Diagnosis Date   ABNORMAL ELECTROCARDIOGRAM 06/21/2007   ALLERGIC RHINITIS 03/23/2007   Anxiety state, unspecified 09/06/2013   ASTHMATIC BRONCHITIS, ACUTE 04/27/2007   BENIGN PROSTATIC HYPERTROPHY 03/23/2007   BRONCHITIS NOT SPECIFIED AS ACUTE OR CHRONIC 04/21/2007   BURSITIS, RIGHT HIP 07/31/2008   CKD (chronic kidney disease)    COPD (chronic obstructive pulmonary disease) (HCC) 04/19/2016   Cough 01/29/2009   Dementia (HCC) 09/01/2010   ERECTILE DYSFUNCTION 03/23/2007   Esophageal stricture    Falls frequently    FREQUENCY, URINARY 04/26/2010   GERD 03/23/2007   HIATAL HERNIA    HYPERLIPIDEMIA 03/23/2007   HYPERTENSION 03/23/2007   MILD COGNITIVE IMPAIRMENT SO STATED 05/19/2010   NEPHROLITHIASIS, HX OF 03/23/2007   Nocturnal confusion 12/04/2023   Daughter states sun downs at night   PSA, INCREASED 06/27/2008   REACTIVE AIRWAY DISEASE 01/14/2010   SCHATZKI'S RING    SCIATICA, RIGHT 04/28/2008   Unspecified hypothyroidism 09/06/2013   Vascular dementia (HCC)     Subjective:   Subjective: Chart Reviewed. Updates received. Patient Assessed. Created space and opportunity for patient  and family to explore thoughts and feelings regarding current medical situation.  Today's Discussion: Today before meeting with the patient/family, I reviewed the chart notes including PT note from yesterday, internal medicine note from yesterday, TOC note from yesterday, nurse note from today, internal medicine note from today. I also reviewed vital signs, nursing flowsheets, medication administrations record, labs, and imaging.  No labs completed  today.  Today saw the patient at the bedside, no family was present.  However, his personal caregiver was present.  The patient was awake and sitting up in a bedside chair.  He is quite alert and greets me as I enter the room.  He states he is doing good today.  He denies pain, nausea, vomiting.  States he ate a good breakfast this morning, caregiver shares that he again did not eat the grits which she does not appear like.  We talked about ongoing attempts to try to find somewhere for him to go to try to get stronger.  I shared that the hospital is working with his family to try to accomplish this.   I provided emotional and general support through therapeutic listening, empathy, sharing of stories, and other techniques. I answered all questions and addressed all concerns to the best of my ability.  After seeing the patient I went and spoke with TOC.  They are actively working to try to find LTC placement for him.  Planning on calling a couple more facilities today.  At this point he appears to be a disposition issue, goals are clear.  Palliative medicine will sign off.  Review of Systems  Constitutional:        Denies pain in general  Respiratory:  Negative for shortness of breath.   Cardiovascular:  Negative for chest pain.  Gastrointestinal:  Negative for abdominal pain, nausea and vomiting.    Objective:   Primary Diagnoses: Present on Admission:  Syncope  Dementia (HCC)  COPD (chronic obstructive pulmonary disease) (HCC)   Vital Signs:  BP 104/72 (BP Location: Right Arm)   Pulse 98  Temp 97.8 F (36.6 C)   Resp 18   Ht 6' (1.829 m)   Wt 83.2 kg   SpO2 98%   BMI 24.88 kg/m   Physical Exam Vitals and nursing note reviewed.  Constitutional:      General: He is not in acute distress.    Appearance: He is ill-appearing.  HENT:     Head: Normocephalic and atraumatic.  Pulmonary:     Effort: Pulmonary effort is normal. No respiratory distress.  Abdominal:     General:  Abdomen is flat.  Skin:    General: Skin is warm and dry.  Neurological:     Mental Status: He is alert. He is disoriented.  Psychiatric:        Mood and Affect: Mood normal.        Behavior: Behavior normal.     Palliative Assessment/Data: 60%   Existing Vynca/ACP Documentation: None  Assessment & Plan:   HPI/Patient Profile:  89 y.o. male  with past medical history of advanced dementia, COPD, stroke, BPH, hypertension, hyperlipidemia, CKD stage IIIb, recent diagnosis of acute ischemic stroke presented from home for evaluation of syncope.  She was admitted on 02/03/2024 with syncope with junctional rhythm, history of CVA with residual left lower extremity weakness, dementia with behavioral disturbances, CKD 3B, and others.   SUMMARY OF RECOMMENDATIONS   DNR-Limited Continue current scope of care At this point working toward LTC admission Goals are quite clear at this time Disposition issue remains Palliative medicine will sign off for now Please call us  for any significant change or new palliative needs prior to discharge  Symptom Management:  Per primary team Palliative medicine is available to assist as needed  Code Status: DNR - Limited (DNR/DNI)  Prognosis: Unable to determine  Discharge Planning: LTC facility  Discussed with: Patient, caregiver, medical team, nursing team, [facility] team  Thank you for allowing us  to participate in the care of Stephen Wilkins PMT will continue to support holistically.  Billing based on MDM: Moderate  Detailed review of medical records (labs, imaging, vital signs), medically appropriate exam, discussed with treatment team, counseling and education to patient, family, & staff, documenting clinical information, medication management, coordination of care  Camellia Kays, NP Palliative Medicine Team  Team Phone # [phone] (Nights/Weekends)  12/01/2020, 8:17 AM

## 2024-02-16 NOTE — Plan of Care (Signed)
 Patient resting in bed at this time. No acute changes throughout the shift. No s/s or c/o distress or discomfort. Caregiver at bedside.   Problem: Education: Goal: Knowledge of General Education information will improve Description: Including pain rating scale, medication(s)/side effects and non-pharmacologic comfort measures Outcome: Progressing   Problem: Health Behavior/Discharge Planning: Goal: Ability to manage health-related needs will improve Outcome: Progressing   Problem: Clinical Measurements: Goal: Ability to maintain clinical measurements within normal limits will improve Outcome: Progressing Goal: Will remain free from infection Outcome: Progressing Goal: Diagnostic test results will improve Outcome: Progressing Goal: Respiratory complications will improve Outcome: Progressing Goal: Cardiovascular complication will be avoided Outcome: Progressing   Problem: Activity: Goal: Risk for activity intolerance will decrease Outcome: Progressing   Problem: Nutrition: Goal: Adequate nutrition will be maintained Outcome: Progressing   Problem: Coping: Goal: Level of anxiety will decrease Outcome: Progressing   Problem: Elimination: Goal: Will not experience complications related to bowel motility Outcome: Progressing Goal: Will not experience complications related to urinary retention Outcome: Progressing   Problem: Pain Managment: Goal: General experience of comfort will improve and/or be controlled Outcome: Progressing   Problem: Safety: Goal: Ability to remain free from injury will improve Outcome: Progressing   Problem: Skin Integrity: Goal: Risk for impaired skin integrity will decrease Outcome: Progressing

## 2024-02-16 NOTE — Progress Notes (Signed)
 Occupational Therapy Treatment Patient Details Name: Stephen Wilkins MRN: [id number] DOB: [birth year] Today's Date: 02/16/2024   History of present illness Pt is a 89 y/o male presenting after syncopal episode at home. Cardiology following and recommend conservative mgmt. Recent admission 9/1-9/5 for acute CVA, syncope and AKI. PMH: hypertension, hyperlipidemia, GERD, dementia, COPD, anxiety, hypothyroidism, esophageal stricture, CKD, recent fall, GI bleed and thoracic aortic aneurysm.   OT comments  Patient demonstrating good gains with bed mobility and sit to stands with use of Stedy. Patient requiring assist of one to get to EOB and to stand from EOB and Stedy pads.  Patient able to perform grooming tasks and self feeding with setup in recliner.  Patient will benefit from continued inpatient follow up therapy, <3 hours/day.  Acute OT to continue to follow to address established goals to facilitate DC to next venue of care.        If plan is discharge home, recommend the following:  A lot of help with walking and/or transfers;Two people to help with walking and/or transfers;A lot of help with bathing/dressing/bathroom;Two people to help with bathing/dressing/bathroom   Equipment Recommendations  None recommended by OT    Recommendations for Other Services      Precautions / Restrictions Precautions Precautions: Fall Recall of Precautions/Restrictions: Impaired Precaution/Restrictions Comments: hx of syncope Restrictions Weight Bearing Restrictions Per Provider Order: No       Mobility Bed Mobility Overal bed mobility: Needs Assistance Bed Mobility: Supine to Sit     Supine to sit: Mod assist, HOB elevated, Used rails     General bed mobility comments: instructions throughout and assistance needed with BLEs and trunk    Transfers Overall transfer level: Needs assistance Equipment used: Ambulation equipment used Transfers: Sit to/from Stand, Bed to chair/wheelchair/BSC Sit  to Stand: Mod assist, Max assist           General transfer comment: mod to max assist to stand from EOB and mod assist from [employer] via Lift Equipment: Stedy   Balance Overall balance assessment: Needs assistance Sitting-balance support: No upper extremity supported, Feet supported Sitting balance-Leahy Scale: Fair Sitting balance - Comments: CGA EOB   Standing balance support: Bilateral upper extremity supported, Reliant on assistive device for balance Standing balance-Leahy Scale: Poor Standing balance comment: reliant on Stedy for support                           ADL either performed or assessed with clinical judgement   ADL Overall ADL's : Needs assistance/impaired Eating/Feeding: Set up;Sitting   Grooming: Wash/dry hands;Wash/dry face;Brushing hair;Set up;Sitting       Lower Body Bathing: Maximal assistance;Sit to/from stand Lower Body Bathing Details (indicate cue type and reason): peri area bathing while standing in [facility]                            Extremity/Trunk Assessment              Vision       Perception     Praxis     Communication Communication Communication: Impaired Factors Affecting Communication: Hearing impaired   Cognition Arousal: Alert Behavior During Therapy: Flat affect, WFL for tasks assessed/performed Cognition: History of cognitive impairments             OT - Cognition Comments: hx of dementia  Following commands: Impaired Following commands impaired: Follows one step commands with increased time, Follows one step commands inconsistently      Cueing   Cueing Techniques: Verbal cues, Tactile cues, Gestural cues  Exercises      Shoulder Instructions       General Comments personal sitter present    Pertinent Vitals/ Pain       Pain Assessment Pain Assessment: Faces Faces Pain Scale: Hurts a little bit Pain Location: generalized with standing Pain  Descriptors / Indicators: Grimacing, Guarding Pain Intervention(s): Limited activity within patient's tolerance, Monitored during session, Repositioned  Home Living                                          Prior Functioning/Environment              Frequency  Min 2X/week        Progress Toward Goals  OT Goals(current goals can now be found in the care plan section)  Progress towards OT goals: Progressing toward goals  Acute Rehab OT Goals Patient Stated Goal: none stated OT Goal Formulation: Patient unable to participate in goal setting Time For Goal Achievement: 02/19/24 Potential to Achieve Goals: Good ADL Goals Pt Will Perform Grooming: with min assist;standing Pt Will Perform Lower Body Dressing: with mod assist;sit to/from stand Pt Will Transfer to Toilet: with mod assist;stand pivot transfer;bedside commode Additional ADL Goal #1: Pt to complete bed mobility with Supervision in prep for EOB/OOB ADLs  Plan      Co-evaluation                 AM-PAC OT 6 Clicks Daily Activity     Outcome Measure   Help from another person eating meals?: A Little Help from another person taking care of personal grooming?: A Little Help from another person toileting, which includes using toliet, bedpan, or urinal?: Total Help from another person bathing (including washing, rinsing, drying)?: A Lot Help from another person to put on and taking off regular upper body clothing?: A Lot Help from another person to put on and taking off regular lower body clothing?: Total 6 Click Score: 12    End of Session Equipment Utilized During Treatment: Gait belt;Other (comment) Laurent)  OT Visit Diagnosis: Unsteadiness on feet (R26.81);Other abnormalities of gait and mobility (R26.89);Muscle weakness (generalized) (M62.81)   Activity Tolerance Patient tolerated treatment well   Patient Left in chair;with call bell/phone within reach;with chair alarm set;with  nursing/sitter in room   Nurse Communication Mobility status;Need for lift equipment        Time: 9195-9177 OT Time Calculation (min): 18 min  Charges: OT General Charges $OT Visit: 1 Visit OT Treatments $Self Care/Home Management : 8-22 mins  Dick Laine, OTA Acute Rehabilitation Services  Office [phone]   Jeb LITTIE Laine 02/16/2024, 11:48 AM

## 2024-02-17 LAB — CBC
HCT: 32.1 % — ABNORMAL LOW (ref 39.0–52.0)
Hemoglobin: 10.2 g/dL — ABNORMAL LOW (ref 13.0–17.0)
MCH: 30.2 pg (ref 26.0–34.0)
MCHC: 31.8 g/dL (ref 30.0–36.0)
MCV: 95 fL (ref 80.0–100.0)
Platelets: 370 K/uL (ref 150–400)
RBC: 3.38 MIL/uL — ABNORMAL LOW (ref 4.22–5.81)
RDW: 12.8 % (ref 11.5–15.5)
WBC: 9 K/uL (ref 4.0–10.5)
nRBC: 0 % (ref 0.0–0.2)

## 2024-02-17 LAB — BASIC METABOLIC PANEL WITH GFR
Anion gap: 9 (ref 5–15)
BUN: 57 mg/dL — ABNORMAL HIGH (ref 8–23)
CO2: 23 mmol/L (ref 22–32)
Calcium: 9 mg/dL (ref 8.9–10.3)
Chloride: 101 mmol/L (ref 98–111)
Creatinine, Ser: 2.25 mg/dL — ABNORMAL HIGH (ref 0.61–1.24)
GFR, Estimated: 27 mL/min — ABNORMAL LOW (ref >60)
Glucose, Bld: 103 mg/dL — ABNORMAL HIGH (ref 70–99)
Potassium: 4.9 mmol/L (ref 3.5–5.1)
Sodium: 133 mmol/L — ABNORMAL LOW (ref 135–145)

## 2024-02-17 NOTE — Progress Notes (Signed)
 Patient seen and examined at bedside today.  He was lying on the bed.  He was being fed.  Appears comfortable.  Oriented to place.  Not in any Distress.  No new change in the medical management.  Patient is waiting for bed in long-term care facility.  TOC following.

## 2024-02-17 NOTE — Plan of Care (Signed)

## 2024-02-18 NOTE — Plan of Care (Signed)

## 2024-02-18 NOTE — Progress Notes (Signed)
 Patient seen and examined at bedside today.  Hemodynamically stable.  Lying comfortably in bed.  Denies any new complaints.  He was being fed by the caregiver.  Not in any kind of distress.  He is waiting for bed at long-term care at a facility.  Discharge summary and orders already placed.  No new change in medical management.

## 2024-02-19 ENCOUNTER — Inpatient Hospital Stay (HOSPITAL_COMMUNITY)

## 2024-02-19 ENCOUNTER — Ambulatory Visit: Admitting: Surgery

## 2024-02-19 DIAGNOSIS — R55 Syncope and collapse: Secondary | ICD-10-CM | POA: Diagnosis not present

## 2024-02-19 DIAGNOSIS — R609 Edema, unspecified: Secondary | ICD-10-CM | POA: Diagnosis not present

## 2024-02-19 DIAGNOSIS — M1712 Unilateral primary osteoarthritis, left knee: Secondary | ICD-10-CM | POA: Diagnosis not present

## 2024-02-19 LAB — URINALYSIS, ROUTINE W REFLEX MICROSCOPIC
Bilirubin Urine: NEGATIVE
Glucose, UA: NEGATIVE mg/dL
Hgb urine dipstick: NEGATIVE
Ketones, ur: NEGATIVE mg/dL
Nitrite: NEGATIVE
Protein, ur: 30 mg/dL — AB
Specific Gravity, Urine: 1.013 (ref 1.005–1.030)
WBC, UA: >50 WBC/hpf (ref 0–5)
pH: 5 (ref 5.0–8.0)

## 2024-02-19 LAB — URIC ACID: Uric Acid, Serum: 8.6 mg/dL (ref 3.7–8.6)

## 2024-02-19 MED ORDER — OXYCODONE HCL 5 MG PO TABS
5.0000 mg | ORAL_TABLET | Freq: Four times a day (QID) | ORAL | Status: DC | PRN
Start: 2024-02-19 — End: 2024-02-20
  Administered 2024-02-20: 5 mg via ORAL
  Filled 2024-02-19: qty 1

## 2024-02-19 MED ORDER — PREDNISONE 10 MG PO TABS
50.0000 mg | ORAL_TABLET | Freq: Every day | ORAL | Status: AC
  Administered 2024-02-19: 50 mg via ORAL
  Filled 2024-02-19: qty 1

## 2024-02-19 MED ORDER — SODIUM CHLORIDE 0.9 % IV SOLN
1.0000 g | INTRAVENOUS | Status: DC
  Filled 2024-02-19 (×2): qty 10

## 2024-02-19 NOTE — Progress Notes (Signed)
 Spoke with Patient daughter [name]. Daughter had concerns regarding previous UTI and Left Knee unable to be extended. MD Notified and new orders were place. 1150am updated patient daughter on the patients status.

## 2024-02-19 NOTE — Plan of Care (Signed)

## 2024-02-19 NOTE — Progress Notes (Signed)
 PROGRESS NOTE  Stephen Wilkins  FMW:[id number] DOB: [birth year] DOA: 02/03/2024 PCP: Norleen Lynwood ORN, MD   Brief Narrative: Patient is a 89 year old male with history of advanced dementia, COPD, stroke, BPH, hypertension, hyperlipidemia, CKD stage IIIb, recent acute ischemic stroke who presented further evaluation of syncope.  Patient lives at home with 24-hour support, also wearing 30-day cardiac monitor at the stroke workup and discharge.  On presentation, cardiac monitor showed junctional rhythm at the time of syncope.  Patient admitted for further evaluation of syncope.  Cardiology also consulted.  Palliative care also consulted for goals of care.  PT recommending SNF on discharge.  Waiting for  placement.    Assessment & Plan:  Principal Problem:   Syncope Active Problems:   Dementia (HCC)   COPD (chronic obstructive pulmonary disease) (HCC)   Normocytic anemia   Hypomagnesemia   Palliative care by specialist  Syncope with  junctional rhythm: Currently hemodynamically stable.  Cardiology was consulted after admission.  She is not a candidate for pacemaker due to her advanced dementia.  Continue supportive  care.  History of stroke with residual left lower extremity weakness: On aspirin , Plavix , Crestor . no evidence of new focal neurological deficits  Dementia with behavioral disturbances: Continue delirium precaution, frequent reorientation.  Continue Aricept   Hypomagnesemia: Supplemented  CKD stage IIIb: Currently kidney function at baseline. Baseline creatinine around 2.5. Avoid nephrotoxins.  GERD: Continue PPI  Left knee pain: Developed sudden onset of left knee pain.  Does not fully extend the left knee.  Left knee found to be edematous, tender and warm.  Possibility of gout.  Checking knee x-ray, uric acid level.  PT consulted.  Continue pain management  Debility/deconditioning/goals of care: CODE STATUS DNR.  Has advanced dementia.  Palliative care was following for goals of  care.  No aggressive interventions as per family.  PT/OT advised skilled nursing facility for rehabilitation.          DVT prophylaxis:heparin injection 5,000 Units Start: 02/04/24 1400     Code Status: Limited: Do not attempt resuscitation (DNR) -DNR-LIMITED -Do Not Intubate/DNI   Family Communication: Called and discussed with his wife Stephen Wilkins on 11/17  Patient status:Inpatient  Patient is from :Home  Anticipated discharge to:SNF  Estimated DC date:whenever possible   Consultants: Palliative care  Procedures:None  Antimicrobials:  Anti-infectives (From admission, onward)    None       Subjective: Patient seen and examined at bedside today.  Hemodynamically stable.  Lying in bed.  Was not able to fully extend his leg.  Left knee found to be edematous, warm on comparison to the right knee. No fall.  I talked to his wife in detail and updated about our management plan  Objective: Vitals:   02/18/24 2025 02/19/24 0026 02/19/24 0500 02/19/24 0747  BP: (!) 141/66 120/74 (!) 144/55   Pulse: 95 98 96 97  Resp: 18 18    Temp: 98.2 F (36.8 C) 98.2 F (36.8 C) 98.1 F (36.7 C)   TempSrc: Oral Oral Oral   SpO2: 95% 96% 96% 96%  Weight:      Height:        Intake/Output Summary (Last 24 hours) at 02/19/2024 1101 Last data filed at 02/19/2024 9487 Gross per 24 hour  Intake --  Output 1300 ml  Net -1300 ml   Filed Weights   02/09/24 2150  Weight: 83.2 kg    Examination:   General exam: Overall comfortable, not in distress,pleasantly confused elderly gentleman HEENT: PERRL Respiratory  system:  no wheezes or crackles  Cardiovascular system: S1 & S2 heard, RRR.  Gastrointestinal system: Abdomen is nondistended, soft and nontender. Central nervous system: Alert and oriented Extremities: warm,tender,edematous left knee Skin: No rashes, no ulcers,no icterus      Data Reviewed: I have personally reviewed following labs and imaging studies  CBC: Recent  Labs  Lab 02/17/24 0530  WBC 9.0  HGB 10.2*  HCT 32.1*  MCV 95.0  PLT 370   Basic Metabolic Panel: Recent Labs  Lab 02/13/24 0259 02/14/24 0230 02/17/24 0530  NA 138 136 133*  K 5.0 5.0 4.9  CL 102 101 101  CO2 25 24 23   GLUCOSE 110* 121* 103*  BUN 65* 67* 57*  CREATININE 2.47* 2.61* 2.25*  CALCIUM  9.3 9.1 9.0     No results found for this or any previous visit (from the past 240 hours).   Radiology Studies: No results found.  Scheduled Meds:  aspirin  EC  81 mg Oral Daily   budesonide -glycopyrrolate -formoterol   2 puff Inhalation BID   clopidogrel   75 mg Oral Daily   docusate sodium   100 mg Oral BID   donepezil   5 mg Oral Daily   fesoterodine  4 mg Oral Daily   finasteride   5 mg Oral Daily   heparin  5,000 Units Subcutaneous Q8H   pantoprazole   40 mg Oral Daily   polyethylene glycol  17 g Oral Daily   rosuvastatin   10 mg Oral Daily   senna  2 tablet Oral QHS   tamsulosin   0.4 mg Oral QHS   Continuous Infusions:   LOS: 14 days   Ivonne Mustache, MD [facility] P11/17/2025, 11:01 AM

## 2024-02-19 NOTE — Plan of Care (Signed)
  Problem: Education: Goal: Knowledge of General Education information will improve Description: Including pain rating scale, medication(s)/side effects and non-pharmacologic comfort measures Outcome: Progressing   Problem: Health Behavior/Discharge Planning: Goal: Ability to manage health-related needs will improve Outcome: Progressing   Problem: Clinical Measurements: Goal: Will remain free from infection Outcome: Progressing Goal: Diagnostic test results will improve Outcome: Progressing Goal: Respiratory complications will improve Outcome: Progressing Goal: Cardiovascular complication will be avoided Outcome: Progressing   Problem: Activity: Goal: Risk for activity intolerance will decrease Outcome: Progressing   Problem: Nutrition: Goal: Adequate nutrition will be maintained Outcome: Progressing   Problem: Coping: Goal: Level of anxiety will decrease Outcome: Progressing

## 2024-02-19 NOTE — TOC Progression Note (Signed)
 Transition of Care [facility]) - Progression Note    Patient Details  Name: FISHEL WAMBLE MRN: [id number] Date of Birth: [birth year]  [facility] CM/SW Contact  Sherline Clack, [place] Phone Number: 02/19/2024, 1:37 PM  Clinical Narrative:     CSW spoke with [facility] regarding application patient's family submitted on Friday. Patient has been approved for a bed. CSW spoke with provider and patient's family regarding bed offer. Anticipated discharge date is tomorrow, 11/18, after patient's knee pain is addressed. CSW will continue to follow patient as needs arise in the hospital.   Expected Discharge Plan: Skilled Nursing Facility Barriers to Discharge: Insurance Authorization               Expected Discharge Plan and Services       Living arrangements for the past 2 months: Single Family Home                                       Social Drivers of Health (SDOH) Interventions SDOH Screenings   Food Insecurity: No Food Insecurity (02/04/2024)  Housing: Low Risk  (02/04/2024)  Transportation Needs: No Transportation Needs (02/04/2024)  Utilities: Not At Risk (02/04/2024)  Alcohol Screen: Low Risk  (10/02/2023)  Depression (PHQ2-9): Low Risk  (01/22/2024)  Financial Resource Strain: Low Risk  (10/02/2023)  Physical Activity: Inactive (10/02/2023)  Social Connections: Moderately Isolated (02/04/2024)  Stress: No Stress Concern Present (10/02/2023)  Tobacco Use: Medium Risk (02/03/2024)  Health Literacy: Adequate Health Literacy (10/02/2023)    Readmission Risk Interventions    11/13/2022   10:22 AM  Readmission Risk Prevention Plan  Transportation Screening Complete  PCP or Specialist Appt within 5-7 Days Complete  Home Care Screening Complete  Medication Review (RN CM) Complete

## 2024-02-19 NOTE — Progress Notes (Signed)
 Physical Therapy Treatment Patient Details Name: Stephen Wilkins MRN: [id number] DOB: 07/09/1927 Today's Date: 02/19/2024   History of Present Illness Pt is a 89 y/o male presenting after syncopal episode at home. Cardiology following and recommend conservative mgmt. Recent admission 9/1-9/5 for acute CVA, syncope and AKI. PMH: hypertension, hyperlipidemia, GERD, dementia, COPD, anxiety, hypothyroidism, esophageal stricture, CKD, recent fall, GI bleed and thoracic aortic aneurysm.    PT Comments  Pt admitted with above diagnosis. Met 0/4 goals. Pt with new onset of left LE pain today in knee. MD aware. Pt was able to stand to [facility] but could not stand fully upright and was not bearing full weight on left LE. Pt practiced standing 3 x and was fatigued.  Worked on getting pts' knee straight and positiioned well on departure with blanket under pts left heel. Pt reports this being comfortable.  Pt currently with functional limitations due to the deficits listed below (see PT Problem List). Pt will benefit from acute skilled PT to increase their independence and safety with mobility to allow discharge.       If plan is discharge home, recommend the following: Two people to help with walking and/or transfers;A lot of help with bathing/dressing/bathroom;Assist for transportation;Assistance with cooking/housework;Supervision due to cognitive status   Can travel by private vehicle     No  Equipment Recommendations  None recommended by PT    Recommendations for Other Services       Precautions / Restrictions Precautions Precautions: Fall Recall of Precautions/Restrictions: Impaired Precaution/Restrictions Comments: hx of syncope Restrictions Weight Bearing Restrictions Per Provider Order: No     Mobility  Bed Mobility Overal bed mobility: Needs Assistance Bed Mobility: Supine to Sit     Supine to sit: HOB elevated, Used rails, Max assist, +2 for physical assistance Sit to supine: +2 for  physical assistance, Total assist   General bed mobility comments: Pt laying in bed with left LE externally rotated and bent.  Initially pt not moving left LE to command but evntually did move it some. Pt with better movement of left LE with repetition. Pt needed instructions throughout and assistance needed with BLEs and trunk.  Pt limitied by incr left knee pain.  MD aware.    Transfers Overall transfer level: Needs assistance Equipment used: Ambulation equipment used Transfers: Sit to/from Stand, Bed to chair/wheelchair/BSC Sit to Stand: Max assist, +2 physical assistance, From elevated surface, Via lift equipment           General transfer comment: max assist to stand from EOB and mod assist from [facility] pads.  Pt not fully weight bearing on left knee as he has pain.  Is waiting for X-ray that MD ordered this am however MD asked PT to see pt today as well. Transfer via Lift Equipment: Stedy  Ambulation/Gait             Pre-gait activities: weight shifting and Associate Professor Rankin (Stroke Patients Only)       Balance Overall balance assessment: Needs assistance Sitting-balance support: No upper extremity supported, Feet supported Sitting balance-Leahy Scale: Fair Sitting balance - Comments: CGA EOB   Standing balance support: Bilateral upper extremity supported, Reliant on assistive device for balance Standing balance-Leahy Scale: Poor Standing balance comment: reliant on Stedy for support  Communication Communication Communication: Impaired Factors Affecting Communication: Hearing impaired  Cognition Arousal: Alert Behavior During Therapy: Flat affect, WFL for tasks assessed/performed   PT - Cognitive impairments: History of cognitive impairments                         Following commands: Impaired Following commands impaired: Follows one  step commands with increased time, Follows one step commands inconsistently    Cueing Cueing Techniques: Verbal cues, Tactile cues, Gestural cues  Exercises General Exercises - Lower Extremity Ankle Circles/Pumps: AROM, Both, 10 reps, Supine Quad Sets: AROM, Both, 5 reps, Supine Long Arc Quad: AROM, Both, Seated, 5 reps Hip Flexion/Marching: AROM, Both, 10 reps, Seated    General Comments General comments (skin integrity, edema, etc.): sitter present      Pertinent Vitals/Pain Pain Assessment Pain Assessment: Faces Faces Pain Scale: Hurts even more Pain Location: left knee Pain Descriptors / Indicators: Grimacing, Guarding Pain Intervention(s): Limited activity within patient's tolerance, Premedicated before session, Monitored during session, Repositioned    Home Living                          Prior Function            PT Goals (current goals can now be found in the care plan section) Acute Rehab PT Goals PT Goal Formulation: With patient/family Time For Goal Achievement: 03/04/24 Potential to Achieve Goals: Fair Progress towards PT goals: Progressing toward goals    Frequency    Min 2X/week      PT Plan      Co-evaluation              AM-PAC PT 6 Clicks Mobility   Outcome Measure  Help needed turning from your back to your side while in a flat bed without using bedrails?: A Lot Help needed moving from lying on your back to sitting on the side of a flat bed without using bedrails?: A Lot Help needed moving to and from a bed to a chair (including a wheelchair)?: Total Help needed standing up from a chair using your arms (e.g., wheelchair or bedside chair)?: Total Help needed to walk in hospital room?: Total Help needed climbing 3-5 steps with a railing? : Total 6 Click Score: 8    End of Session Equipment Utilized During Treatment: Gait belt Activity Tolerance: Patient limited by fatigue Patient left: with call bell/phone within  reach;with family/visitor present;in bed;with bed alarm set (did not get OOB due to pt going for X-ray) Nurse Communication: Mobility status;Need for lift equipment PT Visit Diagnosis: Other abnormalities of gait and mobility (R26.89);History of falling (Z91.81);Other symptoms and signs involving the nervous system (R29.898)     Time: 1004-1030 PT Time Calculation (min) (ACUTE ONLY): 26 min  Charges:    $Therapeutic Exercise: 8-22 mins $Therapeutic Activity: 8-22 mins PT General Charges $$ ACUTE PT VISIT: 1 Visit                     Samaia Iwata M,PT Acute Rehab Services [phone]    Stephane JULIANNA Bevel 02/19/2024, 1:27 PM

## 2024-02-20 LAB — BASIC METABOLIC PANEL WITH GFR
Anion gap: 16 — ABNORMAL HIGH (ref 5–15)
BUN: 64 mg/dL — ABNORMAL HIGH (ref 8–23)
CO2: 22 mmol/L (ref 22–32)
Calcium: 9.7 mg/dL (ref 8.9–10.3)
Chloride: 97 mmol/L — ABNORMAL LOW (ref 98–111)
Creatinine, Ser: 2.27 mg/dL — ABNORMAL HIGH (ref 0.61–1.24)
GFR, Estimated: 26 mL/min — ABNORMAL LOW (ref >60)
Glucose, Bld: 134 mg/dL — ABNORMAL HIGH (ref 70–99)
Potassium: 5.1 mmol/L (ref 3.5–5.1)
Sodium: 135 mmol/L (ref 135–145)

## 2024-02-20 LAB — CBC
HCT: 33 % — ABNORMAL LOW (ref 39.0–52.0)
Hemoglobin: 11 g/dL — ABNORMAL LOW (ref 13.0–17.0)
MCH: 30.5 pg (ref 26.0–34.0)
MCHC: 33.3 g/dL (ref 30.0–36.0)
MCV: 91.4 fL (ref 80.0–100.0)
Platelets: 424 K/uL — ABNORMAL HIGH (ref 150–400)
RBC: 3.61 MIL/uL — ABNORMAL LOW (ref 4.22–5.81)
RDW: 12.4 % (ref 11.5–15.5)
WBC: 13.7 K/uL — ABNORMAL HIGH (ref 4.0–10.5)
nRBC: 0 % (ref 0.0–0.2)

## 2024-02-20 MED ORDER — FOSFOMYCIN TROMETHAMINE 3 G PO PACK
3.0000 g | PACK | Freq: Once | ORAL | Status: AC
  Administered 2024-02-20: 3 g via ORAL
  Filled 2024-02-20 (×2): qty 3

## 2024-02-20 MED ORDER — OXYCODONE HCL 5 MG PO TABS: 5.0000 mg | ORAL_TABLET | Freq: Four times a day (QID) | ORAL | 0 refills | Status: AC | PRN

## 2024-02-20 NOTE — Progress Notes (Signed)
 Occupational Therapy Treatment Patient Details Name: Stephen Wilkins MRN: [id number] DOB: [birth year] Today's Date: 02/20/2024   History of present illness Pt is a 89 y/o male presenting after syncopal episode at home. Cardiology following and recommend conservative mgmt. Recent admission 9/1-9/5 for acute CVA, syncope and AKI. PMH: hypertension, hyperlipidemia, GERD, dementia, COPD, anxiety, hypothyroidism, esophageal stricture, CKD, recent fall, GI bleed and thoracic aortic aneurysm.   OT comments  Pt making good progress towards OT goals. Pt premedicated for pain prior to session and tolerated transfers well today. Pt able to stand in [place] with Min A for transfer to chair. Once in chair, trialed standing using RW with Mod A x 2 needed and difficulty shifting weight forward onto feet noted. Patient will benefit from continued inpatient follow up therapy, <3 hours/day at DC.      If plan is discharge home, recommend the following:  A lot of help with walking and/or transfers;Two people to help with walking and/or transfers;A lot of help with bathing/dressing/bathroom;Two people to help with bathing/dressing/bathroom   Equipment Recommendations  None recommended by OT    Recommendations for Other Services      Precautions / Restrictions Precautions Precautions: Fall Recall of Precautions/Restrictions: Impaired Precaution/Restrictions Comments: hx of syncope Restrictions Weight Bearing Restrictions Per Provider Order: No       Mobility Bed Mobility Overal bed mobility: Needs Assistance Bed Mobility: Supine to Sit     Supine to sit: Mod assist, HOB elevated     General bed mobility comments: cues for sequencing and attention to task. able to lift trunk fairly well but required assist for BLE to EOB and to scoot hips    Transfers Overall transfer level: Needs assistance Equipment used: Rolling walker (2 wheels), Ambulation equipment used Transfers: Sit to/from Stand Sit to  Stand: Min assist, +2 safety/equipment, Mod assist, +2 physical assistance           General transfer comment: Min A to stand in [place] with good effort on pt's part. Transferred to recliner via [place] and attempt 2 stands with RW. Initial stand with heavy posterior bias (one hand pushing from armrest and one on RW). Improvements to lighter Mod A x 2 when pt pulling on RW and more able to shift weight forward. Fatigued quickly Transfer via Lift Equipment: Stedy   Balance Overall balance assessment: Needs assistance Sitting-balance support: No upper extremity supported, Feet supported Sitting balance-Leahy Scale: Fair     Standing balance support: Bilateral upper extremity supported, Reliant on assistive device for balance Standing balance-Leahy Scale: Poor                             ADL either performed or assessed with clinical judgement   ADL Overall ADL's : Needs assistance/impaired                 Upper Body Dressing : Sitting;Moderate assistance                     General ADL Comments: Emphasis on OOB transfers given difficulties in prior PT session d/t L knee pain.    Extremity/Trunk Assessment Upper Extremity Assessment Upper Extremity Assessment: Generalized weakness;Right hand dominant (but good grip strength)   Lower Extremity Assessment Lower Extremity Assessment: Defer to PT evaluation        Vision   Vision Assessment?: No apparent visual deficits   Perception     Praxis     Communication  Communication Communication: Impaired Factors Affecting Communication: Hearing impaired   Cognition Arousal: Alert Behavior During Therapy: Flat affect, WFL for tasks assessed/performed Cognition: History of cognitive impairments             OT - Cognition Comments: hx of dementia                 Following commands: Impaired Following commands impaired: Follows one step commands with increased time, Follows one step commands  inconsistently      Cueing   Cueing Techniques: Verbal cues, Tactile cues, Gestural cues  Exercises      Shoulder Instructions       General Comments      Pertinent Vitals/ Pain       Pain Assessment Pain Assessment: No/denies pain  Home Living                                          Prior Functioning/Environment              Frequency  Min 2X/week        Progress Toward Goals  OT Goals(current goals can now be found in the care plan section)  Progress towards OT goals: Progressing toward goals  Acute Rehab OT Goals Patient Stated Goal: none stated OT Goal Formulation: Patient unable to participate in goal setting Time For Goal Achievement: 02/19/24 Potential to Achieve Goals: Good ADL Goals Pt Will Perform Grooming: with min assist;standing Pt Will Perform Lower Body Dressing: with mod assist;sit to/from stand Pt Will Transfer to Toilet: with mod assist;stand pivot transfer;bedside commode Additional ADL Goal #1: Pt to complete bed mobility with Supervision in prep for EOB/OOB ADLs  Plan      Co-evaluation                 AM-PAC OT 6 Clicks Daily Activity     Outcome Measure   Help from another person eating meals?: A Little Help from another person taking care of personal grooming?: A Little Help from another person toileting, which includes using toliet, bedpan, or urinal?: Total Help from another person bathing (including washing, rinsing, drying)?: A Lot Help from another person to put on and taking off regular upper body clothing?: A Lot Help from another person to put on and taking off regular lower body clothing?: Total 6 Click Score: 12    End of Session Equipment Utilized During Treatment: Gait belt;Rolling walker (2 wheels)  OT Visit Diagnosis: Unsteadiness on feet (R26.81);Other abnormalities of gait and mobility (R26.89);Muscle weakness (generalized) (M62.81)   Activity Tolerance Patient tolerated treatment  well   Patient Left in chair;with call bell/phone within reach;with chair alarm set;with family/visitor present   Nurse Communication Mobility status        Time: 8968-8946 OT Time Calculation (min): 22 min  Charges: OT General Charges $OT Visit: 1 Visit OT Treatments $Therapeutic Activity: 8-22 mins  Mliss NOVAK, OTR/L Acute Rehab Services Office: [phone]   Mliss Fish 02/20/2024, 11:17 AM

## 2024-02-20 NOTE — Progress Notes (Signed)
 Patient seen and examined at the bedside today.  He looks comfortable today.  Able to fully flex his left knee.  Denies any pain on the left knee today.  Denies any abdomen pain, dysuria.  Remains comfortable.  He is medically stable for discharge to SNF today.  He has a bed.  No new change in the medical management.  Discharge summary and orders were already placed

## 2024-02-20 NOTE — Progress Notes (Signed)
 Called [facility] at this number [phone] for report on the patient going to room 5013A. Report given with no other questions at this time and [facility] has been contacted per Case Management.

## 2024-02-20 NOTE — TOC Transition Note (Signed)
 Transition of Care [facility]) - Discharge Note   Patient Details  Name: Stephen Wilkins MRN: [id number] Date of Birth: 04-18-1927  [facility] of [facility] CM/SW Contact:  Sherline Clack, LCSWA Phone Number: 02/20/2024, 1:54 PM   Clinical Narrative:     Patient will DC to: [facility] Anticipated DC date: 02/20/24  Family notified: Aleen Mulders and Lamar Daring Transport by: [facility]   Per MD patient ready for DC to [facility]. RN to call report prior to discharge [phone], [id number]). RN, patient, patient's family, and facility notified of DC. Discharge Summary and FL2 sent to facility. DC packet on chart. Ambulance transport requested for patient.   CSW will sign off for now as social work intervention is no longer needed. Please consult us  again if new needs arise.    Final next level of care: Long Term Nursing Home Barriers to Discharge: Barriers Resolved   Patient Goals and CMS Choice     Choice offered to / list presented to : Patient, Adult Children      Discharge Placement              Patient chooses bed at: [facility] at [facility] Patient to be transferred to facility by: [facility] Name of family member notified: Aleen Mulders Patient and family notified of of transfer: 02/20/24  Discharge Plan and Services Additional resources added to the After Visit Summary for                                       Social Drivers of Health (SDOH) Interventions SDOH Screenings   Food Insecurity: No Food Insecurity (02/04/2024)  Housing: Low Risk  (02/04/2024)  Transportation Needs: No Transportation Needs (02/04/2024)  Utilities: Not At Risk (02/04/2024)  Alcohol Screen: Low Risk  (10/02/2023)  Depression (PHQ2-9): Low Risk  (01/22/2024)  Financial Resource Strain: Low Risk  (10/02/2023)  Physical Activity: Inactive (10/02/2023)  Social Connections: Moderately Isolated (02/04/2024)  Stress: No Stress Concern Present (10/02/2023)  Tobacco Use: Medium Risk (02/03/2024)   Health Literacy: Adequate Health Literacy (10/02/2023)     Readmission Risk Interventions    11/13/2022   10:22 AM  Readmission Risk Prevention Plan  Transportation Screening Complete  PCP or Specialist Appt within 5-7 Days Complete  Home Care Screening Complete  Medication Review (RN CM) Complete

## 2024-02-20 NOTE — Plan of Care (Signed)
 Patient alert to self and situation, patient follows commands. Patient has caregiver at bedside. Patient left with lighting adjusted to promote rest, and bed in lowest position.  Problem: Education: Goal: Knowledge of General Education information will improve Description: Including pain rating scale, medication(s)/side effects and non-pharmacologic comfort measures Outcome: Progressing   Problem: Clinical Measurements: Goal: Ability to maintain clinical measurements within normal limits will improve Outcome: Progressing   Problem: Activity: Goal: Risk for activity intolerance will decrease Outcome: Progressing   Problem: Nutrition: Goal: Adequate nutrition will be maintained Outcome: Progressing   Problem: Coping: Goal: Level of anxiety will decrease Outcome: Progressing   Problem: Pain Managment: Goal: General experience of comfort will improve and/or be controlled Outcome: Progressing   Problem: Safety: Goal: Ability to remain free from injury will improve Outcome: Progressing

## 2024-02-20 NOTE — Plan of Care (Signed)

## 2024-02-22 ENCOUNTER — Telehealth: Payer: Self-pay | Admitting: Adult Health

## 2024-02-22 ENCOUNTER — Ambulatory Visit: Payer: Self-pay | Admitting: Internal Medicine

## 2024-02-22 ENCOUNTER — Other Ambulatory Visit: Payer: Self-pay | Admitting: Internal Medicine

## 2024-02-22 DIAGNOSIS — I129 Hypertensive chronic kidney disease with stage 1 through stage 4 chronic kidney disease, or unspecified chronic kidney disease: Secondary | ICD-10-CM | POA: Diagnosis not present

## 2024-02-22 DIAGNOSIS — D649 Anemia, unspecified: Secondary | ICD-10-CM | POA: Diagnosis not present

## 2024-02-22 DIAGNOSIS — E782 Mixed hyperlipidemia: Secondary | ICD-10-CM | POA: Diagnosis not present

## 2024-02-22 LAB — URINE CULTURE: Culture: >=100000 — AB

## 2024-02-22 MED ORDER — CIPROFLOXACIN HCL 500 MG PO TABS: 500.0000 mg | ORAL_TABLET | Freq: Two times a day (BID) | ORAL | 0 refills | Status: AC

## 2024-02-22 NOTE — Telephone Encounter (Signed)
 Wife asked to cx because they now live in [facility]

## 2024-03-13 DIAGNOSIS — F03B11 Unspecified dementia, moderate, with agitation: Secondary | ICD-10-CM | POA: Diagnosis not present

## 2024-03-13 DIAGNOSIS — F5101 Primary insomnia: Secondary | ICD-10-CM | POA: Diagnosis not present

## 2024-03-19 ENCOUNTER — Ambulatory Visit: Admitting: Emergency Medicine

## 2024-03-21 ENCOUNTER — Ambulatory Visit: Payer: Self-pay | Admitting: Cardiology

## 2024-03-21 DIAGNOSIS — I639 Cerebral infarction, unspecified: Secondary | ICD-10-CM | POA: Diagnosis not present

## 2024-03-21 DIAGNOSIS — I6340 Cerebral infarction due to embolism of unspecified cerebral artery: Secondary | ICD-10-CM

## 2024-03-25 NOTE — Telephone Encounter (Signed)
-----   Message from Alm Clay, MD sent at 03/21/2024  9:07 PM EST -----  30-day MCOT report (01/24/24-11/20 2025)-10 days and 18 hours monitored.   A-fib RVR rate 166 bpm noted at 1017 on 01/30/2024-   For the most part patient was in sinus rhythm.  There is less than 1% A-fib burden (52 minutes total, 86 episodes) with the longest episode being 6 minutes.  Total time in A-fib 52 minutes.  Rate  range 74 to 171 bpm.   Most episodes of A-fib were in the daytime and associated with those changes.   Junctional bradycardia noted as well.   New diagnosis of A-fib relatively low burden and relatively short episodes. Recommend patient is seen to discuss treatment options.  Would likely benefit from oral anticoagulation.  He was contacted when the first episode occurred.  Appointment has not yet been set.     Alm Clay, MD

## 2024-03-25 NOTE — Telephone Encounter (Signed)
"   Spoke to  patient's wife, per DPR.   RN informed Mrs Custer calling to give results of Zio Monitor. Mrs Bricco  states patient  is in a long term nursing-rehab care facility .  Per  wife, patient had several strokes - hospitalized went to rehab and then home for 2 weeks. Patient was unable to walk-  per wife, we had to place him in long care facility at [facility].    Wife asked what did the monitor showed. RN informed it showed some episode of Atrial fib.   Calling to see about an appointment.   Wife states patient is being followed  by provider at  facility.    RN informed Mrs Joswick  that I will contact her after speaking with Dr Anner once he  returns to the office. "

## 2024-03-29 NOTE — Telephone Encounter (Signed)
 Following up on Dr. Genice recommendation and subsequent conversation.  Dr. Anner had recommended anticoagulation.  I do not see any follow-up discussion regarding this.  I would recommend in person or televisit to discuss risks and benefits of anticoagulation in this 89 year old patient with recurrent strokes.  I do not know enough about the patient personally to make that decision at this time.   Thanks MJP

## 2024-04-18 ENCOUNTER — Telehealth: Payer: Self-pay | Admitting: Cardiology

## 2024-04-18 NOTE — Telephone Encounter (Signed)
 Fax sent to [facility]

## 2024-04-18 NOTE — Telephone Encounter (Signed)
 Dr. Feliciano with [facility] facility requesting note from patients heart monitor. Fax # [phone]

## 2024-04-23 ENCOUNTER — Other Ambulatory Visit: Payer: Self-pay | Admitting: Internal Medicine

## 2024-04-24 ENCOUNTER — Other Ambulatory Visit: Payer: Self-pay

## 2024-05-27 ENCOUNTER — Ambulatory Visit: Admitting: Internal Medicine

## 2024-10-02 ENCOUNTER — Ambulatory Visit

## 2024-10-07 ENCOUNTER — Ambulatory Visit: Admitting: Adult Health
